# Patient Record
Sex: Male | Born: 1940
Health system: Southern US, Community
[De-identification: ages and names within clinical notes are randomized; demographics above are authoritative.]

## PROBLEM LIST (undated history)

## (undated) DIAGNOSIS — K3532 Acute appendicitis with perforation and localized peritonitis, without abscess: Secondary | ICD-10-CM

## (undated) DIAGNOSIS — Z87442 Personal history of urinary calculi: Secondary | ICD-10-CM

## (undated) DIAGNOSIS — G20 Parkinson's disease: Secondary | ICD-10-CM

## (undated) DIAGNOSIS — K219 Gastro-esophageal reflux disease without esophagitis: Secondary | ICD-10-CM

## (undated) DIAGNOSIS — Z9289 Personal history of other medical treatment: Secondary | ICD-10-CM

## (undated) DIAGNOSIS — M1990 Unspecified osteoarthritis, unspecified site: Secondary | ICD-10-CM

## (undated) HISTORY — DX: Personal history of urinary calculi: Z87.442

## (undated) HISTORY — DX: Acute appendicitis with perforation, localized peritonitis, and gangrene, without abscess: K35.32

## (undated) HISTORY — PX: TONSILLECTOMY: SUR1361

## (undated) HISTORY — DX: Gastro-esophageal reflux disease without esophagitis: K21.9

## (undated) HISTORY — PX: JOINT REPLACEMENT: SHX530

## (undated) HISTORY — PX: APPENDECTOMY: SHX54

## (undated) HISTORY — DX: Parkinson's disease: G20

## (undated) HISTORY — DX: Personal history of other medical treatment: Z92.89

---

## 2001-03-25 ENCOUNTER — Encounter: Payer: Self-pay | Admitting: Family Medicine

## 2001-03-26 ENCOUNTER — Encounter: Payer: Self-pay | Admitting: Family Medicine

## 2002-07-20 DIAGNOSIS — G20A1 Parkinson's disease without dyskinesia, without mention of fluctuations: Secondary | ICD-10-CM

## 2002-07-20 DIAGNOSIS — G20 Parkinson's disease: Secondary | ICD-10-CM

## 2002-07-20 HISTORY — DX: Parkinson's disease: G20

## 2002-07-20 HISTORY — DX: Parkinson's disease without dyskinesia, without mention of fluctuations: G20.A1

## 2002-08-25 ENCOUNTER — Encounter: Payer: Self-pay | Admitting: Family Medicine

## 2002-08-25 LAB — CONVERTED CEMR LAB
Blood Glucose, Fasting: 100 mg/dL
PSA: 0.8 ng/mL
TSH: 1.8 microintl units/mL

## 2003-08-17 ENCOUNTER — Encounter: Payer: Self-pay | Admitting: Family Medicine

## 2003-08-17 LAB — CONVERTED CEMR LAB
PSA: 0.5 ng/mL
TSH: 1.94 microintl units/mL

## 2003-09-22 ENCOUNTER — Encounter: Payer: Self-pay | Admitting: Family Medicine

## 2003-09-22 LAB — CONVERTED CEMR LAB: TSH: 2.79 microintl units/mL

## 2004-02-03 ENCOUNTER — Encounter: Payer: Self-pay | Admitting: Family Medicine

## 2004-02-03 LAB — CONVERTED CEMR LAB: WBC, blood: 7.8 10*3/uL

## 2004-08-29 ENCOUNTER — Ambulatory Visit: Payer: Self-pay | Admitting: Family Medicine

## 2004-08-29 LAB — CONVERTED CEMR LAB
PSA: 0.57 ng/mL
TSH: 2.9 microintl units/mL

## 2004-09-05 ENCOUNTER — Ambulatory Visit: Payer: Self-pay | Admitting: Family Medicine

## 2004-10-07 ENCOUNTER — Ambulatory Visit: Payer: Self-pay | Admitting: Family Medicine

## 2004-12-05 ENCOUNTER — Ambulatory Visit: Payer: Self-pay | Admitting: Family Medicine

## 2004-12-12 DIAGNOSIS — Z9289 Personal history of other medical treatment: Secondary | ICD-10-CM

## 2004-12-12 HISTORY — DX: Personal history of other medical treatment: Z92.89

## 2005-02-02 ENCOUNTER — Ambulatory Visit: Payer: Self-pay | Admitting: Urology

## 2005-02-02 ENCOUNTER — Encounter: Payer: Self-pay | Admitting: Family Medicine

## 2005-02-02 ENCOUNTER — Ambulatory Visit: Payer: Self-pay | Admitting: Internal Medicine

## 2005-02-02 LAB — CONVERTED CEMR LAB: TSH: 2.43 microintl units/mL

## 2005-02-13 ENCOUNTER — Ambulatory Visit: Payer: Self-pay | Admitting: Internal Medicine

## 2005-03-26 ENCOUNTER — Ambulatory Visit: Payer: Self-pay | Admitting: Family Medicine

## 2005-07-23 ENCOUNTER — Ambulatory Visit: Payer: Self-pay | Admitting: Family Medicine

## 2005-09-15 ENCOUNTER — Ambulatory Visit: Payer: Self-pay | Admitting: Family Medicine

## 2005-09-15 LAB — CONVERTED CEMR LAB
Blood Glucose, Fasting: 98 mg/dL
RBC count: 4.69 10*6/uL
WBC, blood: 5.3 10*3/uL

## 2005-09-18 ENCOUNTER — Ambulatory Visit: Payer: Self-pay | Admitting: Family Medicine

## 2005-10-08 ENCOUNTER — Ambulatory Visit: Payer: Self-pay | Admitting: Family Medicine

## 2005-12-16 ENCOUNTER — Ambulatory Visit: Payer: Self-pay | Admitting: Family Medicine

## 2006-09-22 ENCOUNTER — Ambulatory Visit: Payer: Self-pay | Admitting: Family Medicine

## 2006-09-22 LAB — CONVERTED CEMR LAB
ALT: 16 units/L (ref 0–40)
AST: 22 units/L (ref 0–37)
Alkaline Phosphatase: 49 units/L (ref 39–117)
Bilirubin, Direct: 0.2 mg/dL (ref 0.0–0.3)
Blood Glucose, Fasting: 98 mg/dL
Calcium: 8.6 mg/dL (ref 8.4–10.5)
Chloride: 106 meq/L (ref 96–112)
Cholesterol: 170 mg/dL (ref 0–200)
Creatinine, Ser: 0.8 mg/dL (ref 0.4–1.5)
GFR calc non Af Amer: 103 mL/min
Glucose, Bld: 98 mg/dL (ref 70–99)
LDL Cholesterol: 114 mg/dL — ABNORMAL HIGH (ref 0–99)
PSA: 0.74 ng/mL
PSA: 0.74 ng/mL (ref 0.10–4.00)
Sodium: 141 meq/L (ref 135–145)

## 2006-09-24 ENCOUNTER — Ambulatory Visit: Payer: Self-pay | Admitting: Family Medicine

## 2006-11-01 ENCOUNTER — Ambulatory Visit: Payer: Self-pay | Admitting: Family Medicine

## 2007-07-22 ENCOUNTER — Encounter: Payer: Self-pay | Admitting: Family Medicine

## 2007-09-12 ENCOUNTER — Encounter: Payer: Self-pay | Admitting: Family Medicine

## 2007-09-12 ENCOUNTER — Telehealth: Payer: Self-pay | Admitting: Family Medicine

## 2007-09-22 ENCOUNTER — Encounter: Payer: Self-pay | Admitting: Family Medicine

## 2007-12-09 ENCOUNTER — Encounter: Payer: Self-pay | Admitting: Family Medicine

## 2007-12-09 DIAGNOSIS — E7800 Pure hypercholesterolemia, unspecified: Secondary | ICD-10-CM

## 2007-12-09 DIAGNOSIS — K219 Gastro-esophageal reflux disease without esophagitis: Secondary | ICD-10-CM

## 2007-12-09 DIAGNOSIS — Z87442 Personal history of urinary calculi: Secondary | ICD-10-CM

## 2007-12-22 ENCOUNTER — Ambulatory Visit: Payer: Self-pay | Admitting: Family Medicine

## 2007-12-22 LAB — CONVERTED CEMR LAB
AST: 22 units/L (ref 0–37)
Alkaline Phosphatase: 52 units/L (ref 39–117)
Basophils Absolute: 0 10*3/uL (ref 0.0–0.1)
Bilirubin, Direct: 0.3 mg/dL (ref 0.0–0.3)
CO2: 31 meq/L (ref 19–32)
Calcium: 8.8 mg/dL (ref 8.4–10.5)
Chloride: 100 meq/L (ref 96–112)
Cholesterol: 171 mg/dL (ref 0–200)
Eosinophils Relative: 7.5 % — ABNORMAL HIGH (ref 0.0–5.0)
GFR calc Af Amer: 109 mL/min
Glucose, Bld: 96 mg/dL (ref 70–99)
HCT: 42 % (ref 39.0–52.0)
HDL: 49.9 mg/dL (ref >39.0)
LDL Cholesterol: 114 mg/dL — ABNORMAL HIGH (ref 0–99)
Lymphocytes Relative: 26.4 % (ref 12.0–46.0)
MCHC: 35.5 g/dL (ref 30.0–36.0)
Monocytes Absolute: 0.5 10*3/uL (ref 0.1–1.0)
Monocytes Relative: 11.4 % (ref 3.0–12.0)
Platelets: 182 10*3/uL (ref 150–400)
Potassium: 3.8 meq/L (ref 3.5–5.1)
RDW: 12 % (ref 11.5–14.6)
Sodium: 138 meq/L (ref 135–145)
TSH: 4.03 microintl units/mL (ref 0.35–5.50)
Total Bilirubin: 2.6 mg/dL — ABNORMAL HIGH (ref 0.3–1.2)
Total CHOL/HDL Ratio: 3.4
Total Protein: 6.3 g/dL (ref 6.0–8.3)
Triglycerides: 36 mg/dL (ref 0–149)
VLDL: 7 mg/dL (ref 0–40)

## 2007-12-29 ENCOUNTER — Ambulatory Visit: Payer: Self-pay | Admitting: Family Medicine

## 2008-01-12 ENCOUNTER — Encounter: Payer: Self-pay | Admitting: Family Medicine

## 2008-02-10 ENCOUNTER — Ambulatory Visit: Payer: Self-pay | Admitting: Family Medicine

## 2008-02-10 LAB — CONVERTED CEMR LAB
OCCULT 1: NEGATIVE
OCCULT 3: NEGATIVE

## 2008-02-13 ENCOUNTER — Encounter (INDEPENDENT_AMBULATORY_CARE_PROVIDER_SITE_OTHER): Payer: Self-pay | Admitting: *Deleted

## 2008-12-31 ENCOUNTER — Ambulatory Visit: Payer: Self-pay | Admitting: Family Medicine

## 2008-12-31 LAB — CONVERTED CEMR LAB
ALT: 16 units/L (ref 0–53)
AST: 17 units/L (ref 0–37)
Albumin: 3.7 g/dL (ref 3.5–5.2)
Alkaline Phosphatase: 54 units/L (ref 39–117)
BUN: 20 mg/dL (ref 6–23)
Basophils Absolute: 0 10*3/uL (ref 0.0–0.1)
Basophils Relative: 0.1 % (ref 0.0–3.0)
CO2: 32 meq/L (ref 19–32)
Calcium: 8.8 mg/dL (ref 8.4–10.5)
Chloride: 111 meq/L (ref 96–112)
Cholesterol: 189 mg/dL (ref 0–200)
Creatinine, Ser: 0.9 mg/dL (ref 0.4–1.5)
Eosinophils Relative: 13 % — ABNORMAL HIGH (ref 0.0–5.0)
Glucose, Bld: 106 mg/dL — ABNORMAL HIGH (ref 70–99)
LDL Cholesterol: 124 mg/dL — ABNORMAL HIGH (ref 0–99)
Lymphocytes Relative: 25.2 % (ref 12.0–46.0)
MCHC: 35.3 g/dL (ref 30.0–36.0)
MCV: 102.4 fL — ABNORMAL HIGH (ref 78.0–100.0)
Monocytes Absolute: 0.5 10*3/uL (ref 0.1–1.0)
Monocytes Relative: 9.9 % (ref 3.0–12.0)
Neutrophils Relative %: 51.8 % (ref 43.0–77.0)
PSA: 0.79 ng/mL (ref 0.10–4.00)
Platelets: 167 10*3/uL (ref 150.0–400.0)
Potassium: 4.2 meq/L (ref 3.5–5.1)
RBC: 4.04 M/uL — ABNORMAL LOW (ref 4.22–5.81)
TSH: 2.36 microintl units/mL (ref 0.35–5.50)
Total Protein: 6.4 g/dL (ref 6.0–8.3)
Triglycerides: 36 mg/dL (ref 0.0–149.0)
WBC: 4.7 10*3/uL (ref 4.5–10.5)

## 2009-01-01 ENCOUNTER — Ambulatory Visit: Payer: Self-pay | Admitting: Family Medicine

## 2009-01-01 DIAGNOSIS — I350 Nonrheumatic aortic (valve) stenosis: Secondary | ICD-10-CM

## 2009-01-08 ENCOUNTER — Encounter: Payer: Self-pay | Admitting: Family Medicine

## 2009-01-10 ENCOUNTER — Encounter: Payer: Self-pay | Admitting: Family Medicine

## 2009-01-10 ENCOUNTER — Ambulatory Visit: Payer: Self-pay

## 2009-01-10 HISTORY — PX: DOPPLER ECHOCARDIOGRAPHY: SHX263

## 2009-06-20 ENCOUNTER — Ambulatory Visit: Payer: Self-pay | Admitting: Family Medicine

## 2009-06-21 ENCOUNTER — Telehealth: Payer: Self-pay | Admitting: Family Medicine

## 2009-08-12 ENCOUNTER — Telehealth: Payer: Self-pay | Admitting: Family Medicine

## 2009-09-11 ENCOUNTER — Encounter: Payer: Self-pay | Admitting: Family Medicine

## 2009-10-18 ENCOUNTER — Encounter: Payer: Self-pay | Admitting: Family Medicine

## 2009-11-21 ENCOUNTER — Telehealth: Payer: Self-pay | Admitting: Family Medicine

## 2010-01-01 ENCOUNTER — Ambulatory Visit: Payer: Self-pay | Admitting: Family Medicine

## 2010-01-02 ENCOUNTER — Telehealth: Payer: Self-pay | Admitting: Family Medicine

## 2010-01-02 ENCOUNTER — Ambulatory Visit: Payer: Self-pay | Admitting: Family Medicine

## 2010-01-02 LAB — CONVERTED CEMR LAB
ALT: 7 units/L (ref 0–53)
Albumin: 4 g/dL (ref 3.5–5.2)
Alkaline Phosphatase: 62 units/L (ref 39–117)
BUN: 15 mg/dL (ref 6–23)
Basophils Absolute: 0 10*3/uL (ref 0.0–0.1)
Bilirubin, Direct: 0.2 mg/dL (ref 0.0–0.3)
CO2: 33 meq/L — ABNORMAL HIGH (ref 19–32)
Calcium: 9.1 mg/dL (ref 8.4–10.5)
Chloride: 104 meq/L (ref 96–112)
Creatinine, Ser: 0.7 mg/dL (ref 0.4–1.5)
Eosinophils Absolute: 0.3 10*3/uL (ref 0.0–0.7)
Eosinophils Relative: 7.4 % — ABNORMAL HIGH (ref 0.0–5.0)
HDL: 57.9 mg/dL (ref >39.00)
LDL Cholesterol: 126 mg/dL — ABNORMAL HIGH (ref 0–99)
Lymphocytes Relative: 31.8 % (ref 12.0–46.0)
Lymphs Abs: 1.4 10*3/uL (ref 0.7–4.0)
Monocytes Relative: 12 % (ref 3.0–12.0)
PSA: 0.71 ng/mL (ref 0.10–4.00)
Platelets: 177 10*3/uL (ref 150.0–400.0)
Potassium: 4.6 meq/L (ref 3.5–5.1)
RDW: 13 % (ref 11.5–14.6)
Sodium: 141 meq/L (ref 135–145)
TSH: 2.37 microintl units/mL (ref 0.35–5.50)
Total Bilirubin: 1.5 mg/dL — ABNORMAL HIGH (ref 0.3–1.2)
Total CHOL/HDL Ratio: 3
Triglycerides: 47 mg/dL (ref 0.0–149.0)
WBC: 4.3 10*3/uL — ABNORMAL LOW (ref 4.5–10.5)

## 2010-01-06 ENCOUNTER — Encounter: Payer: Self-pay | Admitting: Family Medicine

## 2010-01-06 LAB — CONVERTED CEMR LAB: Vit D, 25-Hydroxy: 56 ng/mL (ref 30–89)

## 2010-02-12 ENCOUNTER — Ambulatory Visit: Payer: Self-pay | Admitting: Family Medicine

## 2010-02-12 ENCOUNTER — Encounter (INDEPENDENT_AMBULATORY_CARE_PROVIDER_SITE_OTHER): Payer: Self-pay | Admitting: *Deleted

## 2010-02-12 LAB — CONVERTED CEMR LAB: OCCULT 1: NEGATIVE

## 2010-02-20 ENCOUNTER — Encounter (INDEPENDENT_AMBULATORY_CARE_PROVIDER_SITE_OTHER): Payer: Self-pay | Admitting: *Deleted

## 2010-04-30 ENCOUNTER — Encounter: Payer: Self-pay | Admitting: Family Medicine

## 2010-05-12 ENCOUNTER — Encounter: Payer: Self-pay | Admitting: Family Medicine

## 2010-08-01 ENCOUNTER — Telehealth: Payer: Self-pay | Admitting: Family Medicine

## 2010-08-19 NOTE — Consult Note (Signed)
Summary: Dr.James Love,[facility]Note  Dr.James Love,[facility]Note   Imported By: Beau Fanny 09/12/2009 10:22:08  _____________________________________________________________________  External Attachment:    Type:   Image     Comment:   External Document

## 2010-08-19 NOTE — Letter (Signed)
Summary: Dr.Howard Miller,[facility],Note  Dr.Howard Miller,[facility],Note   Imported By: Beau Fanny 05/14/2010 10:29:56  _____________________________________________________________________  External Attachment:    Type:   Image     Comment:   External Document

## 2010-08-19 NOTE — Progress Notes (Signed)
Summary: wants written script for klonopin  Phone Note Refill Request   Refills Requested: Medication #1:  KLONOPIN 0.5 MG  TABS 2 TABLET BY MOUTH AT NIGHT AS NEEDED Pt was seen this morning, he is asking for a written script to be mailed to his home address.  Initial call taken by: Lowella Petties CMA,  January 02, 2010 11:04 AM    Prescriptions: KLONOPIN 0.5 MG  TABS (CLONAZEPAM) 2 TABLET BY MOUTH AT NIGHT AS NEEDED  #60 x 5   Entered and Authorized by:   Shaune Leeks MD   Signed by:   Shaune Leeks MD on 01/02/2010   Method used:   Print then Give to Patient   RxID:   [id number]

## 2010-08-19 NOTE — Consult Note (Signed)
Summary: [facility]  [facility]   Imported By: Lanelle Bal 05/08/2010 11:32:36  _____________________________________________________________________  External Attachment:    Type:   Image     Comment:   External Document
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
## 2010-08-19 NOTE — Progress Notes (Signed)
Summary: Clonazepan 0.5mg   Phone Note Refill Request Message from:  Fax from Pharmacy on August 12, 2009 10:55 AM  Refills Requested: Medication #1:  KLONOPIN 0.5 MG  TABS 1 TABLET BY MOUTH AT NIGHT AS NEEDED   Supply Requested: 1 month   Last Refilled: 06/24/2009 [facility] [phone]   Method Requested: Telephone to Pharmacy Initial call taken by: Benny Lennert CMA Duncan Dull),  August 12, 2009 10:55 AM  Follow-up for Phone Call        Rx called to pharmacy Follow-up by: Benny Lennert CMA (AAMA),  August 12, 2009 3:00 PM    Prescriptions: KLONOPIN 0.5 MG  TABS (CLONAZEPAM) 1 TABLET BY MOUTH AT NIGHT AS NEEDED  #30 x 12   Entered and Authorized by:   Shaune Leeks MD   Signed by:   Shaune Leeks MD on 08/12/2009   Method used:   Telephoned to ...       [facility]* (retail)       [address]       [place], [place]  [address]       Ph: [phone]       Fax: [phone]   RxID:   [id number]

## 2010-08-19 NOTE — Progress Notes (Signed)
Summary: needs something for sleep  Phone Note Call from Patient Call back at Home Phone [phone] Call back at [phone]   Caller: Patient Call For: Dr.Carmellia Kreisler Summary of Call: Pt is requesting a sleeping pill.  Restoril Generic 15 mg # 60 -take 1-2 as needed for sleep. He'd like the rx written for a years supplly. Pt takes Clonopin,but he spoke to his sister, a pharmacist, and she recommended the Restoril.  The Clonopin only works for about 4 hours.Please call in the rx to [facility]. He asked for it to be called in today,so he can get some sleep tonight. Initial call taken by: Beau Fanny,  Nov 21, 2009 9:59 AM  Follow-up for Phone Call        Please insure he reads the fine print. Follow-up by: Shaune Leeks MD,  Nov 21, 2009 1:22 PM  Additional Follow-up for Phone Call Additional follow up Details #1::        Medicine called to pharmacy, advised pt.  He will use with caution. Additional Follow-up by: Lowella Petties CMA,  Nov 21, 2009 3:33 PM    New/Updated Medications: TEMAZEPAM 15 MG CAPS (TEMAZEPAM) take 1-2 tabs by mouth at night as needed for sleep. Prescriptions: TEMAZEPAM 15 MG CAPS (TEMAZEPAM) take 1-2 tabs by mouth at night as needed for sleep.  #60 x 5   Entered and Authorized by:   Shaune Leeks MD   Signed by:   Shaune Leeks MD on 11/21/2009   Method used:   Telephoned to ...         RxID:   [id number]

## 2010-08-19 NOTE — Letter (Signed)
Summary: Nadara Eaton letter  [place] at [facility]  [address] [place], [place] [address]   Phone: [phone]  Fax: [phone]       02/20/2010 MRN: [id number]  [facility] [address] [place], [place]  [address]  Dear Mr. Aund[facility], and [facility] announce the retirement of Arta Silence, M.D., from full-time practice at the [facility] office effective January 16, 2010 and his plans of returning part-time.  It is important to Dr. Hetty Ely and to our practice that you understand that [facility] has seven physicians in our office for your health care needs.  We will continue to offer the same exceptional care that you have today.    Dr. Hetty Ely has spoken to many of you about his plans for retirement and returning part-time in the fall.   We will continue to work with you through the transition to schedule appointments for you in the office and meet the high standards that [place] is committed to.   Again, it is with great pleasure that we share the news that Dr. Hetty Ely will return to [facility] at [facility] in October of 2011 with a reduced schedule.    If you have any questions, or would like to request an appointment with one of our physicians, please call us at [phone] and press the option for Scheduling an appointment.  We take pleasure in providing you with excellent patient care and look forward to seeing you at your next office visit.  Our [facility] Physicians are:  Tillman Abide, M.D. Laurita Quint, M.D. Roxy Manns, M.D. Kerby Nora, M.D. Hannah Beat, M.D. Ruthe Mannan, M.D. We proudly welcomed Raechel Ache, M.D. and Eustaquio Boyden, M.D. to the practice in July/August 2011.  Sincerely,  [facility]

## 2010-08-19 NOTE — Letter (Signed)
Summary: Results Follow up Letter  [place] at [facility]  [address] [place], [place] [address]   Phone: [phone]  Fax: [phone]    02/12/2010 MRN: [id number]    Casey Tucker [address] [place], [place]  [address]    Dear Mr. SHAMPINE,  The following are the results of your recent test(s):  Test         Result    Pap Smear:        Normal _____  Not Normal _____ Comments: ______________________________________________________ Cholesterol: LDL(Bad cholesterol):         Your goal is less than:         HDL (Good cholesterol):       Your goal is more than: Comments:  ______________________________________________________ Mammogram:        Normal _____  Not Normal _____ Comments:  ___________________________________________________________________ Hemoccult:        Normal __X___  Not normal _______ Comments:    _____________________________________________________________________ Other Tests:    We routinely do not discuss normal results over the telephone.  If you desire a copy of the results, or you have any questions about this information we can discuss them at your next office visit.   Sincerely,   Dwana Curd. Para March, M.D.  [facility]

## 2010-08-19 NOTE — Consult Note (Signed)
Summary: Dr.James Love,[facility]Note  Dr.James Love,[facility]Note   Imported By: Beau Fanny 01/07/2010 10:04:54  _____________________________________________________________________  External Attachment:    Type:   Image     Comment:   External Document

## 2010-08-19 NOTE — Letter (Signed)
Summary: Dr.Joseph Museum/gallery curator  Dr.Joseph Savitt,[facility],Note   Imported By: Beau Fanny 11/18/2009 15:24:07  _____________________________________________________________________  External Attachment:    Type:   Image     Comment:   External Document

## 2010-08-21 NOTE — Progress Notes (Signed)
Summary: refill request for klonopin  Phone Note Refill Request Message from:  Fax from Pharmacy  Refills Requested: Medication #1:  KLONOPIN 0.5 MG  TABS 2 TABLET BY MOUTH AT NIGHT AS NEEDED   Last Refilled: 07/03/2010 Faxed request from [facility], phone [phone]  Initial call taken by: Lowella Petties CMA, AAMA,  August 01, 2010 12:38 PM  Follow-up for Phone Call        please call in.  Follow-up by: Crawford Givens MD,  August 01, 2010 1:40 PM  Additional Follow-up for Phone Call Additional follow up Details #1::        Medication phoned to pharmacy.  Additional Follow-up by: Delilah Shan CMA (AAMA),  August 01, 2010 2:31 PM    Prescriptions: KLONOPIN 0.5 MG  TABS (CLONAZEPAM) 2 TABLET BY MOUTH AT NIGHT AS NEEDED  #60 x 5   Entered and Authorized by:   Crawford Givens MD   Signed by:   Crawford Givens MD on 08/01/2010   Method used:   Telephoned to ...         RxID:   [id number]

## 2010-09-02 ENCOUNTER — Emergency Department: Payer: Self-pay | Admitting: Emergency Medicine

## 2010-09-10 ENCOUNTER — Encounter: Payer: Self-pay | Admitting: Family Medicine

## 2010-09-25 NOTE — Letter (Signed)
Summary: [facility]  [facility]   Imported By: Kassie Mends 09/16/2010 09:17:58  _____________________________________________________________________  External Attachment:    Type:   Image     Comment:   External Document

## 2010-12-27 ENCOUNTER — Encounter: Payer: Self-pay | Admitting: Family Medicine

## 2011-01-05 ENCOUNTER — Encounter: Payer: Self-pay | Admitting: Cardiovascular Disease

## 2011-01-05 ENCOUNTER — Other Ambulatory Visit: Payer: Self-pay

## 2011-01-08 ENCOUNTER — Encounter: Payer: Self-pay | Admitting: Family Medicine

## 2011-01-22 ENCOUNTER — Other Ambulatory Visit: Payer: Self-pay | Admitting: Family Medicine

## 2011-01-22 DIAGNOSIS — E7800 Pure hypercholesterolemia, unspecified: Secondary | ICD-10-CM

## 2011-01-22 DIAGNOSIS — Z125 Encounter for screening for malignant neoplasm of prostate: Secondary | ICD-10-CM

## 2011-01-28 ENCOUNTER — Other Ambulatory Visit (INDEPENDENT_AMBULATORY_CARE_PROVIDER_SITE_OTHER): Payer: Medicare Other | Admitting: Family Medicine

## 2011-01-28 DIAGNOSIS — G20 Parkinson's disease: Secondary | ICD-10-CM

## 2011-01-28 DIAGNOSIS — E7800 Pure hypercholesterolemia, unspecified: Secondary | ICD-10-CM

## 2011-01-28 DIAGNOSIS — Z125 Encounter for screening for malignant neoplasm of prostate: Secondary | ICD-10-CM

## 2011-01-28 LAB — HEPATIC FUNCTION PANEL
ALT: 10 U/L (ref 0–53)
AST: 18 U/L (ref 0–37)
Albumin: 3.9 g/dL (ref 3.5–5.2)
Alkaline Phosphatase: 65 U/L (ref 39–117)
Bilirubin, Direct: 0.2 mg/dL (ref 0.0–0.3)
Total Protein: 6.2 g/dL (ref 6.0–8.3)

## 2011-01-28 LAB — CBC WITH DIFFERENTIAL/PLATELET
Basophils Absolute: 0 10*3/uL (ref 0.0–0.1)
Eosinophils Absolute: 0.3 10*3/uL (ref 0.0–0.7)
HCT: 41 % (ref 39.0–52.0)
Hemoglobin: 14.4 g/dL (ref 13.0–17.0)
Lymphocytes Relative: 29.7 % (ref 12.0–46.0)
Lymphs Abs: 1.3 10*3/uL (ref 0.7–4.0)
Monocytes Absolute: 0.5 10*3/uL (ref 0.1–1.0)
Monocytes Relative: 10.7 % (ref 3.0–12.0)
Neutrophils Relative %: 52.1 % (ref 43.0–77.0)
Platelets: 175 10*3/uL (ref 150.0–400.0)
RDW: 13.5 % (ref 11.5–14.6)

## 2011-01-28 LAB — BASIC METABOLIC PANEL
BUN: 18 mg/dL (ref 6–23)
Calcium: 8.5 mg/dL (ref 8.4–10.5)
Creatinine, Ser: 0.7 mg/dL (ref 0.4–1.5)
GFR: 111.36 mL/min (ref >60.00)
Glucose, Bld: 95 mg/dL (ref 70–99)
Potassium: 4 mEq/L (ref 3.5–5.1)
Sodium: 140 mEq/L (ref 135–145)

## 2011-01-28 LAB — LIPID PANEL
Cholesterol: 189 mg/dL (ref 0–200)
LDL Cholesterol: 120 mg/dL — ABNORMAL HIGH (ref 0–99)
Triglycerides: 32 mg/dL (ref 0.0–149.0)

## 2011-01-28 LAB — PSA: PSA: 0.73 ng/mL (ref 0.10–4.00)

## 2011-01-29 ENCOUNTER — Encounter: Payer: Self-pay | Admitting: Family Medicine

## 2011-01-29 ENCOUNTER — Ambulatory Visit (INDEPENDENT_AMBULATORY_CARE_PROVIDER_SITE_OTHER): Payer: Medicare Other | Admitting: Family Medicine

## 2011-01-29 DIAGNOSIS — Z0000 Encounter for general adult medical examination without abnormal findings: Secondary | ICD-10-CM

## 2011-01-29 DIAGNOSIS — Z87442 Personal history of urinary calculi: Secondary | ICD-10-CM

## 2011-01-29 DIAGNOSIS — E7800 Pure hypercholesterolemia, unspecified: Secondary | ICD-10-CM

## 2011-01-29 DIAGNOSIS — K219 Gastro-esophageal reflux disease without esophagitis: Secondary | ICD-10-CM

## 2011-01-29 DIAGNOSIS — G20 Parkinson's disease: Secondary | ICD-10-CM | POA: Insufficient documentation

## 2011-01-29 LAB — VITAMIN B12: Vitamin B-12: 681 pg/mL (ref 211–911)

## 2011-01-29 NOTE — Assessment & Plan Note (Signed)
Stable

## 2011-01-29 NOTE — Assessment & Plan Note (Signed)
Stable and unchanged

## 2011-01-29 NOTE — Assessment & Plan Note (Signed)
Adequate control but try to get LDL lower. Discussed diet. Lab Results  Component Value Date   CHOL 189 01/28/2011   CHOL 193 01/01/2010   CHOL 189 12/31/2008   Lab Results  Component Value Date   HDL 62.40 01/28/2011   HDL 16.10 01/01/2010   HDL 96.04 12/31/2008   Lab Results  Component Value Date   LDLCALC 120* 01/28/2011   LDLCALC 126* 01/01/2010   LDLCALC 124* 12/31/2008   Lab Results  Component Value Date   TRIG 32.0 01/28/2011   TRIG 47.0 01/01/2010   TRIG 36.0 12/31/2008   Lab Results  Component Value Date   CHOLHDL 3 01/28/2011   CHOLHDL 3 01/01/2010   CHOLHDL 3 12/31/2008   No results found for this basename: LDLDIRECT

## 2011-01-29 NOTE — Assessment & Plan Note (Signed)
Encouraged continued hydration.

## 2011-01-29 NOTE — Patient Instructions (Signed)
Discuss colonoscopy with Shirlee Limerick.

## 2011-01-29 NOTE — Progress Notes (Signed)
Subjective:    Patient ID: Casey Tucker, male    DOB: 1941-04-09, 70 y.o.   MRN: [id number]  HPI Pt here for Comp Exam. He sees Dr Sandria Manly regularly for followup of Parkinson's. He has been doing great. He was last seen a few weeks ago. Now called Atypical Parkinson's.  He has had an intense vacation lately. He has had hip pain and finally had an injection. This has really helped. Sees Dr Hyacinth Meeker and the injection really helped.  He has been in the hospital overnight for passing out at a restaurant. He gets lightheaded easily since childhood. Review of Systems  Constitutional: Negative for fever, chills, diaphoresis, appetite change, fatigue and unexpected weight change.  HENT: Negative for hearing loss, ear pain, tinnitus and ear discharge.   Eyes: Negative for pain, discharge and visual disturbance.  Respiratory: Negative for cough, shortness of breath and wheezing.   Cardiovascular: Negative for chest pain and palpitations.       No SOB w/ exertion  Gastrointestinal: Positive for constipation (rare constipation.). Negative for nausea, vomiting, abdominal pain, diarrhea and blood in stool.       No heartburn or swallowing problems.  Genitourinary: Negative for dysuria, frequency and difficulty urinating.       No nocturia  Musculoskeletal: Negative for myalgias, back pain and arthralgias.  Skin: Negative for rash.       No itching or dryness.  Neurological: Negative for tremors and numbness.       No tingling or balance problems.  Hematological: Negative for adenopathy. Does not bruise/bleed easily.  Psychiatric/Behavioral: Negative for dysphoric mood and agitation.    Allergies  Allergen Reactions  . Penicillins     REACTION: RASH   Current Outpatient Prescriptions on File Prior to Visit  Medication Sig Dispense Refill  . carbidopa-levodopa (SINEMET) 25-100 MG per tablet 1 tablet 3 (three) times daily.       . clonazePAM (KLONOPIN) 0.5 MG tablet Take 2 by mouth at night as  needed       . meloxicam (MOBIC) 15 MG tablet Take 15 mg by mouth daily as needed. With food       . pramipexole (MIRAPEX) 0.5 MG tablet Take 0.5 mg by mouth 4 (four) times daily. Per Dr. Sandria Manly      . Rasagiline Mesylate (AZILECT) 1 MG TABS Take by mouth daily.        . temazepam (RESTORIL) 15 MG capsule Take 1-2 by mouth at night as needed for sleep        Past Medical History  Diagnosis Date  . GERD (gastroesophageal reflux disease) Pre 2002    Mild  . Ruptured appendix teens  . History of kidney stones   . 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 Benign essential tremor     father had tremor   History   Social History  . Marital Status: Married    Spouse Name: N/A    Number of Children: 2  . Years of Education: N/A   Occupational History  . Optometrist     medically retired   Social History Main Topics  . Smoking status: Never Smoker   . Smokeless tobacco: Never Used  . Alcohol Use: 3.5 oz/week    7 drink(s) per week     occassionally  . Drug Use: No  . Sexually Active: Yes   Other Topics Concern  . Not on file  Social History Narrative   Married, lives with wife2 daughters   Family History  Problem Relation Age of Onset  . Arthritis Mother     knee replacement  . COPD Father     emphysema, smoker  . Heart disease Father     CHF  . Alcohol abuse Paternal Uncle       BP 118/64  Pulse 76  Temp(Src) 97.5 F (36.4 C) (Oral)  Ht 5\' 11"  (1.803 m)  Wt 188 lb 8 oz (85.503 kg)  BMI 26.29 kg/m2  Objective:   Physical Exam  Constitutional: He is oriented to person, place, and time. He appears well-developed and well-nourished. No distress.  HENT:  Head: Normocephalic and atraumatic.  Right Ear: External ear normal.  Left Ear: External ear normal.  Nose: Nose normal.  Mouth/Throat: Oropharynx is clear and moist.  Eyes: Conjunctivae and EOM are normal. Pupils are equal, round,  and reactive to light. Right eye exhibits no discharge. Left eye exhibits no discharge. No scleral icterus.  Neck: Normal range of motion. Neck supple. No thyromegaly present.  Cardiovascular: Normal rate, regular rhythm, normal heart sounds and intact distal pulses.   No murmur heard. Pulmonary/Chest: Effort normal and breath sounds normal. No respiratory distress. He has no wheezes.  Abdominal: Soft. Bowel sounds are normal. He exhibits no distension and no mass. There is no tenderness. There is no rebound and no guarding.  Genitourinary: Rectum normal, prostate normal and penis normal. Guaiac negative stool.       Minimal bulge at the top of the right inguinal canal. Does not yet appear to be overt hernia, merely weakening. Prostate 10gms, smooth, symmetric and firm.  Musculoskeletal: Normal range of motion. He exhibits no edema.  Lymphadenopathy:    He has no cervical adenopathy.  Neurological: He is alert and oriented to person, place, and time. Coordination normal.  Skin: Skin is warm and dry. No rash noted. He is not diaphoretic.  Psychiatric: He has a normal mood and affect. His behavior is normal. Judgment and thought content normal.          Assessment & Plan:  HMPE  I have personally reviewed the Medicare Annual Wellness questionnaire and have noted 1. The patient's medical and social history 2. Their use of alcohol, tobacco or illicit drugs 3. Their current medications and supplements 4. The patient's functional ability including ADL's, fall risks, home safety risks and hearing or visual             impairment. 5. Diet and physical activities 6. Evidence for depression or mood disorders

## 2011-01-29 NOTE — Assessment & Plan Note (Signed)
Stable, great response to medications. Per Dr Sandria Manly.

## 2011-02-03 ENCOUNTER — Other Ambulatory Visit: Payer: Medicare Other

## 2011-02-03 ENCOUNTER — Other Ambulatory Visit: Payer: Self-pay | Admitting: Family Medicine

## 2011-02-03 DIAGNOSIS — Z1211 Encounter for screening for malignant neoplasm of colon: Secondary | ICD-10-CM

## 2011-02-03 LAB — FECAL OCCULT BLOOD, IMMUNOCHEMICAL: Fecal Occult Bld: NEGATIVE

## 2011-02-05 ENCOUNTER — Encounter: Payer: Self-pay | Admitting: *Deleted

## 2011-03-31 ENCOUNTER — Other Ambulatory Visit: Payer: Self-pay | Admitting: *Deleted

## 2011-03-31 NOTE — Telephone Encounter (Signed)
On temazepam and clonazepam. Will wait for Dr. Kathie Rhodes to return tomorrow.

## 2011-03-31 NOTE — Telephone Encounter (Signed)
Ok to refill in Dr. Lorenza Chick absence?

## 2011-04-01 MED ORDER — CLONAZEPAM 0.5 MG PO TABS: ORAL_TABLET | ORAL | Status: DC

## 2011-04-01 NOTE — Telephone Encounter (Signed)
He has not taken Restoril for quite a while. He takes Clonazepam, typically one at night as needed.

## 2011-04-01 NOTE — Telephone Encounter (Signed)
Pharmacist notified as instructed by telephone. Was informed by pharmacist that they had requested Clonazepam and not Restoril.  Refill given to pharmacist as instructed.

## 2011-07-23 ENCOUNTER — Telehealth: Payer: Self-pay | Admitting: Internal Medicine

## 2011-07-23 DIAGNOSIS — M1610 Unilateral primary osteoarthritis, unspecified hip: Secondary | ICD-10-CM | POA: Diagnosis not present

## 2011-07-23 NOTE — Telephone Encounter (Signed)
Advised patient that copy of his most recent physical will be put in the mail for him today.

## 2011-07-23 NOTE — Telephone Encounter (Signed)
Patient would like a copy of his physical that was done in June or July of 2012 mailed to him.

## 2011-07-30 DIAGNOSIS — M169 Osteoarthritis of hip, unspecified: Secondary | ICD-10-CM | POA: Diagnosis not present

## 2011-07-31 ENCOUNTER — Telehealth: Payer: Self-pay | Admitting: *Deleted

## 2011-07-31 NOTE — Telephone Encounter (Signed)
Patient scheduled appt. On 08/14/11 @ 8:30.

## 2011-07-31 NOTE — Telephone Encounter (Signed)
A form has arrived for surgical clearance from this patient's ortho for left hip surgery.  This is Dr. Lorenza Chick patient.  I phoned the patient to find out to whom he would like to transfer his care.  He stated that he would like to see Dr. Alphonsus Sias.  Are you willing to accept him as a patient or do we need to ask him to choose another physician?  He had a CPE back in July or August.

## 2011-07-31 NOTE — Telephone Encounter (Signed)
Okay to set up with me I will need 30 minutes to do preop clearance

## 2011-08-05 ENCOUNTER — Other Ambulatory Visit: Payer: Self-pay | Admitting: Orthopedic Surgery

## 2011-08-10 ENCOUNTER — Telehealth: Payer: Self-pay | Admitting: *Deleted

## 2011-08-10 NOTE — Telephone Encounter (Signed)
Can add today at 4:15 and block 4:30 Then can use 4:45 as same day  Otherwise Wednesday at 12:00

## 2011-08-10 NOTE — Telephone Encounter (Signed)
Patient coming Wednesday at 12pm

## 2011-08-10 NOTE — Telephone Encounter (Signed)
Pt calling asking for a sooner appt for surgical clearance, pt has appt on Friday 08/14/11 @ 8:30am but per pt he needs to have surgery ASAP. I don't see a appt available until maybe Thursday @ 1:30 or Tuesday at 12pm? Please advise if you can see pt sooner.  Pt transferring from Dr.Schaller.

## 2011-08-12 ENCOUNTER — Encounter: Payer: Self-pay | Admitting: Internal Medicine

## 2011-08-12 ENCOUNTER — Ambulatory Visit (INDEPENDENT_AMBULATORY_CARE_PROVIDER_SITE_OTHER): Payer: Medicare Other | Admitting: Internal Medicine

## 2011-08-12 VITALS — BP 130/60 | HR 69 | Temp 98.7°F | Ht 71.0 in | Wt 196.0 lb

## 2011-08-12 DIAGNOSIS — M169 Osteoarthritis of hip, unspecified: Secondary | ICD-10-CM | POA: Insufficient documentation

## 2011-08-12 DIAGNOSIS — R011 Cardiac murmur, unspecified: Secondary | ICD-10-CM

## 2011-08-12 DIAGNOSIS — G20 Parkinson's disease: Secondary | ICD-10-CM

## 2011-08-12 NOTE — Assessment & Plan Note (Signed)
Now with severe pain and disability Due for surgery on Friday No medical contraindications to proposed THR Should have routine DVT prophylaxis

## 2011-08-12 NOTE — Progress Notes (Signed)
Subjective:    Patient ID: Casey Tucker, male    DOB: 08-Jul-1941, 71 y.o.   MRN: [id number]  HPI Here for preop evaluation  Has had progressive worsening of left hip pain Cortisone injection by Dr Hyacinth Meeker in November Sudden worsening and now unable to even walk Now due for [facility] with Dr Katrinka Blazing  Ongoing Parkinson's disease--atypical Generally controlled with med  No chest pain No SOB No paroxysmal nocturnal dyspnea No sig edema Did have syncopal spell in restaurant almost a year ago. It was hot. Evaluation was negative in ER No dizziness  Current Outpatient Prescriptions on File Prior to Visit  Medication Sig Dispense Refill  . carbidopa-levodopa (SINEMET) 25-100 MG per tablet 1 tablet 3 (three) times daily.       . pramipexole (MIRAPEX) 0.5 MG tablet Take 0.5 mg by mouth 4 (four) times daily. Per Dr. Sandria Manly      . Rasagiline Mesylate (AZILECT) 1 MG TABS Take by mouth daily.          Allergies  Allergen Reactions  . Penicillins     REACTION: RASH    Past Medical History  Diagnosis Date  . GERD (gastroesophageal reflux disease) Pre 2002    Mild  . Ruptured appendix teens  . History of kidney stones   . 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 Parkinson disease 2004    Slowly progressive    Past Surgical History  Procedure Date  . Neuro evaluation 12/19/2004    Dr. Sandria Manly, benign tremor  . Doppler echocardiography 01/10/2009    LV NML Mild LVH EF 60-65% aortic sclerosis w/0 stenosis     Family History  Problem Relation Age of Onset  . Arthritis Mother     knee replacement  . COPD Father     emphysema, smoker  . Heart disease Father     CHF  . Alcohol abuse Paternal Uncle     History   Social History  . Marital Status: Married    Spouse Name: N/A    Number of Children: 2  . Years of Education: N/A   Occupational History  . Optometrist     medically retired   Social  History Main Topics  . Smoking status: Never Smoker   . Smokeless tobacco: Never Used  . Alcohol Use: 3.5 oz/week    7 drink(s) per week     occassionally  . Drug Use: No  . Sexually Active: Yes   Other Topics Concern  . Not on file   Social History Narrative   Married, lives with wife2 daughtersHas living willWife has health care POA---then daughtersWould still accept CPR--but no prolonged artificial means (ventilator or tube feeds)   Review of Systems Sleep problems with hip pain Appetite is fine Weight is up a few pounds with inactivity    Objective:   Physical Exam  Constitutional: He appears well-developed and well-nourished. No distress.  Neck: Normal range of motion. Neck supple. No thyromegaly present.  Cardiovascular: Normal rate, regular rhythm and intact distal pulses.  Exam reveals no gallop.   Murmur heard.      Very soft aortic systolic murmur  Pulmonary/Chest: Effort normal and breath sounds normal. No respiratory distress. He has no wheezes. He has no rales.  Abdominal: Soft. There is no tenderness.  Musculoskeletal: He exhibits no edema.  Lymphadenopathy:    He has no cervical adenopathy.  Neurological:  Mild resting tremor in right hand mostly Fairly normal tone  Psychiatric: He has a normal mood and affect. His behavior is normal. Judgment and thought content normal.          Assessment & Plan:

## 2011-08-12 NOTE — Assessment & Plan Note (Addendum)
Echo doesn't show stenosis This should not be an issue for surgery  EKG is normal except for 1 PAC

## 2011-08-12 NOTE — Assessment & Plan Note (Signed)
Fairly mild Controlled with meds and exercise If the THR can be done with spinal, I would recommend this due to increased risk of cognitive decline after general anaesthesia

## 2011-08-13 ENCOUNTER — Ambulatory Visit: Payer: Self-pay | Admitting: Specialist

## 2011-08-13 DIAGNOSIS — M1610 Unilateral primary osteoarthritis, unspecified hip: Secondary | ICD-10-CM | POA: Diagnosis not present

## 2011-08-13 LAB — CBC
HCT: 37.8 % — ABNORMAL LOW (ref 40.0–52.0)
HGB: 13 g/dL (ref 13.0–18.0)
MCH: 35.9 pg — ABNORMAL HIGH (ref 26.0–34.0)
MCHC: 34.4 g/dL (ref 32.0–36.0)
RBC: 3.61 10*6/uL — ABNORMAL LOW (ref 4.40–5.90)
WBC: 6.5 10*3/uL (ref 3.8–10.6)

## 2011-08-13 LAB — URINALYSIS, COMPLETE
Bilirubin,UR: NEGATIVE
Blood: NEGATIVE
Glucose,UR: NEGATIVE mg/dL (ref 0–75)
Leukocyte Esterase: NEGATIVE
Ph: 5 (ref 4.5–8.0)
Specific Gravity: 1.027 (ref 1.003–1.030)
Squamous Epithelial: NONE SEEN

## 2011-08-13 LAB — BASIC METABOLIC PANEL
Anion Gap: 7 (ref 7–16)
BUN: 18 mg/dL (ref 7–18)
Calcium, Total: 8.9 mg/dL (ref 8.5–10.1)
Co2: 30 mmol/L (ref 21–32)
Creatinine: 0.88 mg/dL (ref 0.60–1.30)
EGFR (African American): >60
Osmolality: 274 (ref 275–301)

## 2011-08-13 LAB — APTT: Activated PTT: 30.6 secs (ref 23.6–35.9)

## 2011-08-13 LAB — PROTIME-INR: Prothrombin Time: 13.1 secs (ref 11.5–14.7)

## 2011-08-14 ENCOUNTER — Ambulatory Visit: Payer: Medicare Other | Admitting: Internal Medicine

## 2011-08-14 ENCOUNTER — Inpatient Hospital Stay: Payer: Self-pay | Admitting: Specialist

## 2011-08-14 DIAGNOSIS — Z471 Aftercare following joint replacement surgery: Secondary | ICD-10-CM | POA: Diagnosis not present

## 2011-08-14 DIAGNOSIS — M1990 Unspecified osteoarthritis, unspecified site: Secondary | ICD-10-CM | POA: Diagnosis not present

## 2011-08-14 DIAGNOSIS — Z9889 Other specified postprocedural states: Secondary | ICD-10-CM | POA: Diagnosis not present

## 2011-08-14 DIAGNOSIS — M8708 Idiopathic aseptic necrosis of bone, other site: Secondary | ICD-10-CM | POA: Diagnosis present

## 2011-08-14 DIAGNOSIS — M8700 Idiopathic aseptic necrosis of unspecified bone: Secondary | ICD-10-CM | POA: Diagnosis not present

## 2011-08-14 DIAGNOSIS — A4902 Methicillin resistant Staphylococcus aureus infection, unspecified site: Secondary | ICD-10-CM | POA: Diagnosis not present

## 2011-08-14 DIAGNOSIS — Z880 Allergy status to penicillin: Secondary | ICD-10-CM | POA: Diagnosis not present

## 2011-08-14 DIAGNOSIS — G20 Parkinson's disease: Secondary | ICD-10-CM | POA: Diagnosis present

## 2011-08-14 DIAGNOSIS — M169 Osteoarthritis of hip, unspecified: Secondary | ICD-10-CM | POA: Diagnosis present

## 2011-08-14 DIAGNOSIS — M1610 Unilateral primary osteoarthritis, unspecified hip: Secondary | ICD-10-CM | POA: Diagnosis not present

## 2011-08-14 DIAGNOSIS — M6281 Muscle weakness (generalized): Secondary | ICD-10-CM | POA: Diagnosis not present

## 2011-08-14 DIAGNOSIS — Z79899 Other long term (current) drug therapy: Secondary | ICD-10-CM | POA: Diagnosis not present

## 2011-08-14 DIAGNOSIS — Z8614 Personal history of Methicillin resistant Staphylococcus aureus infection: Secondary | ICD-10-CM | POA: Diagnosis not present

## 2011-08-14 DIAGNOSIS — R269 Unspecified abnormalities of gait and mobility: Secondary | ICD-10-CM | POA: Diagnosis not present

## 2011-08-14 DIAGNOSIS — K219 Gastro-esophageal reflux disease without esophagitis: Secondary | ICD-10-CM | POA: Diagnosis present

## 2011-08-14 DIAGNOSIS — Z96649 Presence of unspecified artificial hip joint: Secondary | ICD-10-CM | POA: Diagnosis not present

## 2011-08-14 DIAGNOSIS — I700 Atherosclerosis of aorta: Secondary | ICD-10-CM | POA: Diagnosis present

## 2011-08-15 LAB — BASIC METABOLIC PANEL
Anion Gap: 9 (ref 7–16)
Calcium, Total: 7.6 mg/dL — ABNORMAL LOW (ref 8.5–10.1)
Co2: 29 mmol/L (ref 21–32)
Creatinine: 0.73 mg/dL (ref 0.60–1.30)
EGFR (African American): >60
EGFR (Non-African Amer.): >60
Potassium: 3.8 mmol/L (ref 3.5–5.1)

## 2011-08-15 LAB — HEMATOCRIT: HCT: 26.5 % — ABNORMAL LOW (ref 40.0–52.0)

## 2011-08-17 LAB — HEMOGLOBIN: HGB: 8.5 g/dL — ABNORMAL LOW (ref 13.0–18.0)

## 2011-08-17 LAB — URIC ACID: Uric Acid: 3.1 mg/dL — ABNORMAL LOW (ref 3.5–7.2)

## 2011-08-18 DIAGNOSIS — M8700 Idiopathic aseptic necrosis of unspecified bone: Secondary | ICD-10-CM | POA: Diagnosis not present

## 2011-08-18 DIAGNOSIS — Z471 Aftercare following joint replacement surgery: Secondary | ICD-10-CM | POA: Diagnosis not present

## 2011-08-18 DIAGNOSIS — D649 Anemia, unspecified: Secondary | ICD-10-CM | POA: Diagnosis not present

## 2011-08-18 DIAGNOSIS — K219 Gastro-esophageal reflux disease without esophagitis: Secondary | ICD-10-CM | POA: Diagnosis not present

## 2011-08-18 DIAGNOSIS — T84099A Other mechanical complication of unspecified internal joint prosthesis, initial encounter: Secondary | ICD-10-CM | POA: Diagnosis not present

## 2011-08-18 DIAGNOSIS — S73006A Unspecified dislocation of unspecified hip, initial encounter: Secondary | ICD-10-CM | POA: Diagnosis not present

## 2011-08-18 DIAGNOSIS — T84069A Wear of articular bearing surface of unspecified internal prosthetic joint, initial encounter: Secondary | ICD-10-CM | POA: Diagnosis not present

## 2011-08-18 DIAGNOSIS — I517 Cardiomegaly: Secondary | ICD-10-CM | POA: Diagnosis not present

## 2011-08-18 DIAGNOSIS — Z96649 Presence of unspecified artificial hip joint: Secondary | ICD-10-CM | POA: Diagnosis not present

## 2011-08-18 DIAGNOSIS — G20A1 Parkinson's disease without dyskinesia, without mention of fluctuations: Secondary | ICD-10-CM | POA: Diagnosis not present

## 2011-08-18 DIAGNOSIS — Z87442 Personal history of urinary calculi: Secondary | ICD-10-CM | POA: Diagnosis not present

## 2011-08-18 DIAGNOSIS — M6281 Muscle weakness (generalized): Secondary | ICD-10-CM | POA: Diagnosis not present

## 2011-08-18 DIAGNOSIS — R269 Unspecified abnormalities of gait and mobility: Secondary | ICD-10-CM | POA: Diagnosis not present

## 2011-08-18 DIAGNOSIS — I359 Nonrheumatic aortic valve disorder, unspecified: Secondary | ICD-10-CM | POA: Diagnosis not present

## 2011-08-18 DIAGNOSIS — M1990 Unspecified osteoarthritis, unspecified site: Secondary | ICD-10-CM | POA: Diagnosis not present

## 2011-08-18 DIAGNOSIS — T84029A Dislocation of unspecified internal joint prosthesis, initial encounter: Secondary | ICD-10-CM | POA: Diagnosis not present

## 2011-08-18 DIAGNOSIS — G20 Parkinson's disease: Secondary | ICD-10-CM | POA: Diagnosis not present

## 2011-08-18 DIAGNOSIS — Z79899 Other long term (current) drug therapy: Secondary | ICD-10-CM | POA: Diagnosis not present

## 2011-08-18 DIAGNOSIS — Z01811 Encounter for preprocedural respiratory examination: Secondary | ICD-10-CM | POA: Diagnosis not present

## 2011-08-18 DIAGNOSIS — Z880 Allergy status to penicillin: Secondary | ICD-10-CM | POA: Diagnosis not present

## 2011-08-18 DIAGNOSIS — M1610 Unilateral primary osteoarthritis, unspecified hip: Secondary | ICD-10-CM | POA: Diagnosis not present

## 2011-08-18 DIAGNOSIS — A4902 Methicillin resistant Staphylococcus aureus infection, unspecified site: Secondary | ICD-10-CM | POA: Diagnosis not present

## 2011-08-18 DIAGNOSIS — M8708 Idiopathic aseptic necrosis of bone, other site: Secondary | ICD-10-CM | POA: Diagnosis not present

## 2011-08-18 LAB — PATHOLOGY REPORT

## 2011-08-18 LAB — WOUND CULTURE

## 2011-08-20 DIAGNOSIS — M8708 Idiopathic aseptic necrosis of bone, other site: Secondary | ICD-10-CM

## 2011-08-20 DIAGNOSIS — G20 Parkinson's disease: Secondary | ICD-10-CM

## 2011-08-20 DIAGNOSIS — K219 Gastro-esophageal reflux disease without esophagitis: Secondary | ICD-10-CM

## 2011-08-20 DIAGNOSIS — I359 Nonrheumatic aortic valve disorder, unspecified: Secondary | ICD-10-CM

## 2011-08-21 DIAGNOSIS — Z01811 Encounter for preprocedural respiratory examination: Secondary | ICD-10-CM | POA: Diagnosis not present

## 2011-08-21 DIAGNOSIS — M1990 Unspecified osteoarthritis, unspecified site: Secondary | ICD-10-CM | POA: Diagnosis not present

## 2011-08-21 DIAGNOSIS — G20 Parkinson's disease: Secondary | ICD-10-CM | POA: Diagnosis not present

## 2011-08-21 DIAGNOSIS — T84029A Dislocation of unspecified internal joint prosthesis, initial encounter: Secondary | ICD-10-CM | POA: Diagnosis not present

## 2011-08-21 DIAGNOSIS — I517 Cardiomegaly: Secondary | ICD-10-CM | POA: Diagnosis not present

## 2011-08-21 DIAGNOSIS — Z96649 Presence of unspecified artificial hip joint: Secondary | ICD-10-CM | POA: Diagnosis not present

## 2011-08-21 DIAGNOSIS — Z471 Aftercare following joint replacement surgery: Secondary | ICD-10-CM | POA: Diagnosis not present

## 2011-08-21 DIAGNOSIS — K219 Gastro-esophageal reflux disease without esophagitis: Secondary | ICD-10-CM | POA: Diagnosis not present

## 2011-08-21 DIAGNOSIS — T84099A Other mechanical complication of unspecified internal joint prosthesis, initial encounter: Secondary | ICD-10-CM | POA: Diagnosis not present

## 2011-08-21 DIAGNOSIS — M1610 Unilateral primary osteoarthritis, unspecified hip: Secondary | ICD-10-CM | POA: Diagnosis not present

## 2011-08-21 DIAGNOSIS — T84069A Wear of articular bearing surface of unspecified internal prosthetic joint, initial encounter: Secondary | ICD-10-CM | POA: Diagnosis not present

## 2011-08-21 DIAGNOSIS — D649 Anemia, unspecified: Secondary | ICD-10-CM | POA: Diagnosis not present

## 2011-08-21 DIAGNOSIS — S73006A Unspecified dislocation of unspecified hip, initial encounter: Secondary | ICD-10-CM | POA: Diagnosis not present

## 2011-08-21 DIAGNOSIS — M8700 Idiopathic aseptic necrosis of unspecified bone: Secondary | ICD-10-CM | POA: Diagnosis not present

## 2011-08-25 DIAGNOSIS — S73006A Unspecified dislocation of unspecified hip, initial encounter: Secondary | ICD-10-CM

## 2011-08-25 DIAGNOSIS — M1610 Unilateral primary osteoarthritis, unspecified hip: Secondary | ICD-10-CM | POA: Diagnosis not present

## 2011-08-26 ENCOUNTER — Encounter (HOSPITAL_COMMUNITY): Payer: Self-pay | Admitting: *Deleted

## 2011-08-26 DIAGNOSIS — T84099A Other mechanical complication of unspecified internal joint prosthesis, initial encounter: Secondary | ICD-10-CM | POA: Diagnosis not present

## 2011-08-26 MED ORDER — TRANEXAMIC ACID 100 MG/ML IV SOLN
15.0000 mg/kg | Freq: Once | INTRAVENOUS | Status: AC
  Administered 2011-08-27: 1333.5 mg via INTRAVENOUS
  Filled 2011-08-26: qty 13.34

## 2011-08-26 NOTE — H&P (Signed)
Casey Tucker is an 71 y.o. male.    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 male complaining of left hip pain for 1 week.  Pt has a history of a left total hip arthroplasty. Pt states that he was ok for about a week, but then during an episode of PT he felt a pop and had instant and continuous severe pain since. X-rays in the clinic show left hip dislocation and disassociation of the acetabular component from the bone.  The pt was originally had a diagnosis of AVN of the left hip. He had a left total hip arthroplasty on 08/14/2011.   Various options are discussed with the patient. Risks, benefits and expectations were discussed with the patient. Patient understand the risks, benefits and expectations and wishes to proceed with surgery.   PCP:  Tillman Abide, MD, MD  D/C Plans:  Home with HHPT  Post-op Meds:  Rx given for ASA, Iron, Colace and MiraLax. Pt already has Flexeril for muscle spasms.  Tranexamic Acid:  To be given  PMH: Past Medical History  Diagnosis Date  . GERD (gastroesophageal reflux disease) Pre 2002    Mild  . Ruptured appendix teens  . History of kidney stones   . 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 Parkinson disease 2004    Slowly progressive  . Arthritis     left hip     PSH: Past Surgical History  Procedure Date  . Neuro evaluation 12/19/2004    Dr. Sandria Manly, benign tremor  . Doppler echocardiography 01/10/2009    LV NML Mild LVH EF 60-65% aortic sclerosis w/0 stenosis   . Appendectomy   . Joint replacement     left hip replacement 08/14/11/[place]     Social History:  reports that he has never smoked. He has never used smokeless tobacco. He reports that he drinks about 3.5 ounces of alcohol per week. He reports that he does not use illicit drugs.  Allergies:  Allergies  Allergen Reactions  . Amoxicillin   . Celebrex (Celecoxib)     . Demerol Other (See Comments)    Hallucinations   . Penicillins     REACTION: RASH    Medications: No current facility-administered medications for this encounter.   Current Outpatient Prescriptions  Medication Sig Dispense Refill  . acetaminophen (TYLENOL) 325 MG tablet Take 325-650 mg by mouth every 4 (four) hours as needed. pain      . ALPRAZolam (XANAX) 0.5 MG tablet Take 0.5 mg by mouth 2 (two) times daily.      . carbidopa-levodopa (SINEMET) 25-100 MG per tablet 1 tablet 3 (three) times daily. Prior to meals  1/2 hour      . cyclobenzaprine (FLEXERIL) 10 MG tablet Take 10 mg by mouth at bedtime.      . ferrous gluconate (FERGON) 246 (28 FE) MG tablet Take 246 mg by mouth 3 (three) times daily with meals.      . magnesium hydroxide (MILK OF MAGNESIA) 400 MG/5ML suspension Take 30 mLs by mouth daily as needed. laxative      . oxycodone (OXY-IR) 5 MG capsule Take 5-10 mg by mouth every 3 (three) hours as needed. pain      . polyethylene glycol (MIRALAX / GLYCOLAX) packet Take 17 g by mouth daily.      . pramipexole (MIRAPEX) 0.5 MG tablet Take 0.5 mg by mouth  3 (three) times daily.      . pramipexole (MIRAPEX) 1 MG tablet Take 1 mg by mouth at bedtime.      . Rasagiline Mesylate (AZILECT) 1 MG TABS Take 1 mg by mouth daily.       Marland Kitchen senna (SENOKOT) 8.6 MG tablet Take 1 tablet by mouth 2 (two) times daily.        ROS: Review of Systems  Constitutional: Negative.   HENT: Negative.   Eyes: Negative.   Respiratory: Negative.   Cardiovascular: Negative.   Gastrointestinal: Negative.   Genitourinary: Negative.   Musculoskeletal: Positive for joint pain.  Skin: Negative.   Neurological: Negative.   Endo/Heme/Allergies: Negative.   Psychiatric/Behavioral: Negative.      Physican Exam: Physical Exam  Constitutional: He is oriented to person, place, and time and well-developed, well-nourished, and in no distress.  HENT:  Head: Normocephalic and atraumatic.  Nose: Nose  normal.  Mouth/Throat: Oropharynx is clear and moist.  Eyes: Pupils are equal, round, and reactive to light.  Neck: Neck supple. No JVD present. No tracheal deviation present. No thyromegaly present.  Cardiovascular: Normal rate, regular rhythm, normal heart sounds and intact distal pulses.   Pulmonary/Chest: Effort normal and breath sounds normal. No stridor.  Abdominal: Soft. There is no tenderness. There is no guarding.  Musculoskeletal:       Left hip: He exhibits decreased range of motion (limited in test), decreased strength (limited in test), tenderness, bony tenderness and swelling.  Lymphadenopathy:    He has no cervical adenopathy.  Neurological: He is alert and oriented to person, place, and time.  Skin: Skin is warm and dry.  Psychiatric: Affect normal.     Assessment/Plan Assessment: Failed left total hip arthroplasty  Plan: Patient will undergo a left total hip revision on 08/26/2011 at [facility]. Risks benefits and expectation were discussed with the patient. Patient understand risks, benefits and expectation and wishes to proceed.   Anastasio Auerbach Orin Eberwein   PAC  08/26/2011, 6:09 PM

## 2011-08-27 ENCOUNTER — Encounter (HOSPITAL_COMMUNITY): Payer: Self-pay | Admitting: Anesthesiology

## 2011-08-27 ENCOUNTER — Inpatient Hospital Stay (HOSPITAL_COMMUNITY): Payer: Medicare Other

## 2011-08-27 ENCOUNTER — Inpatient Hospital Stay (HOSPITAL_COMMUNITY)
Admission: RE | Admit: 2011-08-27 | Discharge: 2011-08-29 | DRG: 468 | Disposition: A | Discharge status: Home Health | Payer: Medicare Other | Source: Ambulatory Visit | Attending: Orthopedic Surgery | Admitting: Orthopedic Surgery

## 2011-08-27 ENCOUNTER — Ambulatory Visit (HOSPITAL_COMMUNITY): Payer: Medicare Other | Admitting: Anesthesiology

## 2011-08-27 ENCOUNTER — Encounter (HOSPITAL_COMMUNITY)
Admission: RE | Disposition: A | Discharge status: Home Health | Payer: Self-pay | Source: Ambulatory Visit | Attending: Orthopedic Surgery

## 2011-08-27 ENCOUNTER — Ambulatory Visit (HOSPITAL_COMMUNITY): Payer: Medicare Other

## 2011-08-27 ENCOUNTER — Encounter (HOSPITAL_COMMUNITY): Payer: Self-pay | Admitting: *Deleted

## 2011-08-27 DIAGNOSIS — Y831 Surgical operation with implant of artificial internal device as the cause of abnormal reaction of the patient, or of later complication, without mention of misadventure at the time of the procedure: Secondary | ICD-10-CM | POA: Diagnosis present

## 2011-08-27 DIAGNOSIS — K219 Gastro-esophageal reflux disease without esophagitis: Secondary | ICD-10-CM | POA: Diagnosis present

## 2011-08-27 DIAGNOSIS — T84029A Dislocation of unspecified internal joint prosthesis, initial encounter: Principal | ICD-10-CM | POA: Diagnosis present

## 2011-08-27 DIAGNOSIS — T84069A Wear of articular bearing surface of unspecified internal prosthetic joint, initial encounter: Secondary | ICD-10-CM | POA: Diagnosis not present

## 2011-08-27 DIAGNOSIS — G20 Parkinson's disease: Secondary | ICD-10-CM | POA: Diagnosis present

## 2011-08-27 DIAGNOSIS — Z01811 Encounter for preprocedural respiratory examination: Secondary | ICD-10-CM | POA: Diagnosis not present

## 2011-08-27 DIAGNOSIS — I517 Cardiomegaly: Secondary | ICD-10-CM | POA: Diagnosis not present

## 2011-08-27 DIAGNOSIS — Z96649 Presence of unspecified artificial hip joint: Secondary | ICD-10-CM | POA: Diagnosis not present

## 2011-08-27 DIAGNOSIS — D649 Anemia, unspecified: Secondary | ICD-10-CM | POA: Diagnosis not present

## 2011-08-27 DIAGNOSIS — Z87442 Personal history of urinary calculi: Secondary | ICD-10-CM

## 2011-08-27 DIAGNOSIS — Z79899 Other long term (current) drug therapy: Secondary | ICD-10-CM | POA: Diagnosis not present

## 2011-08-27 DIAGNOSIS — Z880 Allergy status to penicillin: Secondary | ICD-10-CM | POA: Diagnosis not present

## 2011-08-27 DIAGNOSIS — Z471 Aftercare following joint replacement surgery: Secondary | ICD-10-CM | POA: Diagnosis not present

## 2011-08-27 DIAGNOSIS — G20A1 Parkinson's disease without dyskinesia, without mention of fluctuations: Secondary | ICD-10-CM | POA: Diagnosis present

## 2011-08-27 HISTORY — PX: TOTAL HIP REVISION: SHX763

## 2011-08-27 HISTORY — DX: Unspecified osteoarthritis, unspecified site: M19.90

## 2011-08-27 LAB — SURGICAL PCR SCREEN
MRSA, PCR: POSITIVE — AB
Staphylococcus aureus: POSITIVE — AB

## 2011-08-27 LAB — CBC
HCT: 29 % — ABNORMAL LOW (ref 39.0–52.0)
Hemoglobin: 9.7 g/dL — ABNORMAL LOW (ref 13.0–17.0)
MCH: 34.6 pg — ABNORMAL HIGH (ref 26.0–34.0)
MCHC: 33.4 g/dL (ref 30.0–36.0)
Platelets: 474 10*3/uL — ABNORMAL HIGH (ref 150–400)
RDW: 13.3 % (ref 11.5–15.5)

## 2011-08-27 LAB — TYPE AND SCREEN: ABO/RH(D): A NEG

## 2011-08-27 LAB — BASIC METABOLIC PANEL
BUN: 13 mg/dL (ref 6–23)
CO2: 29 mEq/L (ref 19–32)
Calcium: 9 mg/dL (ref 8.4–10.5)
Chloride: 98 mEq/L (ref 96–112)
Creatinine, Ser: 0.78 mg/dL (ref 0.50–1.35)
GFR calc Af Amer: >90 mL/min (ref >90)
GFR calc non Af Amer: >90 mL/min (ref >90)

## 2011-08-27 LAB — DIFFERENTIAL
Basophils Absolute: 0 10*3/uL (ref 0.0–0.1)
Basophils Relative: 1 % (ref 0–1)
Eosinophils Absolute: 0.2 10*3/uL (ref 0.0–0.7)
Monocytes Absolute: 0.5 10*3/uL (ref 0.1–1.0)
Monocytes Relative: 11 % (ref 3–12)
Neutro Abs: 3 10*3/uL (ref 1.7–7.7)
Neutrophils Relative %: 69 % (ref 43–77)

## 2011-08-27 LAB — ABO/RH: ABO/RH(D): A NEG

## 2011-08-27 LAB — PROTIME-INR: Prothrombin Time: 14.3 seconds (ref 11.6–15.2)

## 2011-08-27 SURGERY — TOTAL HIP REVISION
Anesthesia: General | Site: Hip | Laterality: Left | Wound class: Clean

## 2011-08-27 MED ORDER — SODIUM CHLORIDE 0.9 % IR SOLN
Status: DC | PRN
  Administered 2011-08-27: 3000 mL

## 2011-08-27 MED ORDER — CEFAZOLIN SODIUM 1-5 GM-% IV SOLN
1.0000 g | INTRAVENOUS | Status: AC
  Administered 2011-08-27: 2 g via INTRAVENOUS

## 2011-08-27 MED ORDER — PHENOL 1.4 % MT LIQD: 1.0000 | OROMUCOSAL | Status: DC | PRN

## 2011-08-27 MED ORDER — POLYETHYLENE GLYCOL 3350 17 G PO PACK
17.0000 g | PACK | Freq: Two times a day (BID) | ORAL | Status: DC
  Administered 2011-08-27 – 2011-08-28 (×3): 17 g via ORAL
  Filled 2011-08-27 (×5): qty 1

## 2011-08-27 MED ORDER — VANCOMYCIN HCL IN DEXTROSE 1-5 GM/200ML-% IV SOLN
1000.0000 mg | Freq: Once | INTRAVENOUS | Status: AC
  Administered 2011-08-28: 1000 mg via INTRAVENOUS
  Filled 2011-08-27: qty 200

## 2011-08-27 MED ORDER — DIPHENHYDRAMINE HCL 25 MG PO CAPS: 25.0000 mg | ORAL_CAPSULE | Freq: Four times a day (QID) | ORAL | Status: DC | PRN

## 2011-08-27 MED ORDER — METOCLOPRAMIDE HCL 10 MG PO TABS: 5.0000 mg | ORAL_TABLET | Freq: Three times a day (TID) | ORAL | Status: DC | PRN

## 2011-08-27 MED ORDER — RASAGILINE MESYLATE 1 MG PO TABS
1.0000 mg | ORAL_TABLET | Freq: Every day | ORAL | Status: DC
  Administered 2011-08-28 – 2011-08-29 (×2): 1 mg via ORAL

## 2011-08-27 MED ORDER — HYDROCODONE-ACETAMINOPHEN 7.5-325 MG PO TABS
1.0000 | ORAL_TABLET | ORAL | Status: DC
  Administered 2011-08-27 – 2011-08-28 (×3): 1 via ORAL
  Administered 2011-08-28 (×3): 2 via ORAL
  Administered 2011-08-28 – 2011-08-29 (×4): 1 via ORAL
  Filled 2011-08-27: qty 2
  Filled 2011-08-27 (×4): qty 1
  Filled 2011-08-27 (×3): qty 2
  Filled 2011-08-27: qty 1
  Filled 2011-08-27: qty 2
  Filled 2011-08-27: qty 1

## 2011-08-27 MED ORDER — PRAMIPEXOLE DIHYDROCHLORIDE 0.25 MG PO TABS
0.5000 mg | ORAL_TABLET | Freq: Three times a day (TID) | ORAL | Status: DC
  Administered 2011-08-27: 0.25 mg via ORAL
  Administered 2011-08-28 – 2011-08-29 (×4): 0.5 mg via ORAL
  Filled 2011-08-27 (×7): qty 2

## 2011-08-27 MED ORDER — ACETAMINOPHEN 650 MG RE SUPP: 650.0000 mg | Freq: Four times a day (QID) | RECTAL | Status: DC | PRN

## 2011-08-27 MED ORDER — METHOCARBAMOL 500 MG PO TABS
500.0000 mg | ORAL_TABLET | Freq: Four times a day (QID) | ORAL | Status: DC | PRN
  Administered 2011-08-28: 500 mg via ORAL
  Filled 2011-08-27: qty 1

## 2011-08-27 MED ORDER — RIVAROXABAN 10 MG PO TABS
10.0000 mg | ORAL_TABLET | ORAL | Status: DC
  Administered 2011-08-28 – 2011-08-29 (×2): 10 mg via ORAL
  Filled 2011-08-27 (×2): qty 1

## 2011-08-27 MED ORDER — CEFAZOLIN SODIUM-DEXTROSE 2-3 GM-% IV SOLR
INTRAVENOUS | Status: AC
  Filled 2011-08-27: qty 50

## 2011-08-27 MED ORDER — PROMETHAZINE HCL 25 MG/ML IJ SOLN: 6.2500 mg | INTRAMUSCULAR | Status: DC | PRN

## 2011-08-27 MED ORDER — SODIUM CHLORIDE 0.9 % IV SOLN
100.0000 mL/h | INTRAVENOUS | Status: DC
  Administered 2011-08-27 – 2011-08-28 (×2): 100 mL/h via INTRAVENOUS
  Filled 2011-08-27 (×6): qty 1000

## 2011-08-27 MED ORDER — CARBIDOPA-LEVODOPA 25-100 MG PO TABS
1.0000 | ORAL_TABLET | Freq: Three times a day (TID) | ORAL | Status: DC
  Administered 2011-08-27 – 2011-08-29 (×5): 1 via ORAL
  Filled 2011-08-27 (×8): qty 1

## 2011-08-27 MED ORDER — MORPHINE SULFATE 0.5 MG/ML IJ SOLN
INTRAMUSCULAR | Status: AC
  Filled 2011-08-27: qty 10

## 2011-08-27 MED ORDER — MORPHINE SULFATE (PF) 0.5 MG/ML IJ SOLN
INTRAMUSCULAR | Status: DC | PRN
  Administered 2011-08-27: 100 ug via EPIDURAL

## 2011-08-27 MED ORDER — DEXAMETHASONE SODIUM PHOSPHATE 4 MG/ML IJ SOLN
INTRAMUSCULAR | Status: DC | PRN
  Administered 2011-08-27: 10 mg via INTRAVENOUS

## 2011-08-27 MED ORDER — CEFAZOLIN SODIUM 1-5 GM-% IV SOLN
1.0000 g | Freq: Four times a day (QID) | INTRAVENOUS | Status: AC
  Administered 2011-08-27 – 2011-08-28 (×3): 1 g via INTRAVENOUS
  Filled 2011-08-27 (×3): qty 50

## 2011-08-27 MED ORDER — DOCUSATE SODIUM 100 MG PO CAPS
100.0000 mg | ORAL_CAPSULE | Freq: Two times a day (BID) | ORAL | Status: DC
  Administered 2011-08-27 – 2011-08-29 (×4): 100 mg via ORAL
  Filled 2011-08-27 (×5): qty 1

## 2011-08-27 MED ORDER — MIDAZOLAM HCL 5 MG/5ML IJ SOLN
INTRAMUSCULAR | Status: DC | PRN
  Administered 2011-08-27: 2 mg via INTRAVENOUS

## 2011-08-27 MED ORDER — ONDANSETRON HCL 4 MG/2ML IJ SOLN: 4.0000 mg | Freq: Four times a day (QID) | INTRAMUSCULAR | Status: DC | PRN

## 2011-08-27 MED ORDER — HYDROMORPHONE HCL PF 1 MG/ML IJ SOLN: 0.5000 mg | INTRAMUSCULAR | Status: DC | PRN

## 2011-08-27 MED ORDER — ONDANSETRON HCL 4 MG PO TABS: 4.0000 mg | ORAL_TABLET | Freq: Four times a day (QID) | ORAL | Status: DC | PRN

## 2011-08-27 MED ORDER — MUPIROCIN 2 % EX OINT
TOPICAL_OINTMENT | Freq: Two times a day (BID) | CUTANEOUS | Status: DC
  Administered 2011-08-27 – 2011-08-28 (×2): via NASAL
  Administered 2011-08-28: 1 via NASAL

## 2011-08-27 MED ORDER — FLEET ENEMA 7-19 GM/118ML RE ENEM: 1.0000 | ENEMA | Freq: Once | RECTAL | Status: AC | PRN

## 2011-08-27 MED ORDER — BISACODYL 5 MG PO TBEC: 5.0000 mg | DELAYED_RELEASE_TABLET | Freq: Every day | ORAL | Status: DC | PRN

## 2011-08-27 MED ORDER — CEFAZOLIN SODIUM 1-5 GM-% IV SOLN: 1.0000 g | INTRAVENOUS | Status: DC

## 2011-08-27 MED ORDER — PRAMIPEXOLE DIHYDROCHLORIDE 1 MG PO TABS
1.0000 mg | ORAL_TABLET | Freq: Every day | ORAL | Status: DC
  Administered 2011-08-27 – 2011-08-28 (×2): 1 mg via ORAL
  Filled 2011-08-27 (×4): qty 1

## 2011-08-27 MED ORDER — MUPIROCIN 2 % EX OINT
TOPICAL_OINTMENT | CUTANEOUS | Status: AC
  Filled 2011-08-27: qty 22

## 2011-08-27 MED ORDER — PROPOFOL 10 MG/ML IV EMUL
INTRAVENOUS | Status: DC | PRN
  Administered 2011-08-27: 50 ug/kg/min via INTRAVENOUS

## 2011-08-27 MED ORDER — BUPIVACAINE IN DEXTROSE 0.75-8.25 % IT SOLN
INTRATHECAL | Status: DC | PRN
  Administered 2011-08-27: 15 mg via INTRATHECAL

## 2011-08-27 MED ORDER — FERROUS SULFATE 325 (65 FE) MG PO TABS
325.0000 mg | ORAL_TABLET | Freq: Three times a day (TID) | ORAL | Status: DC
  Administered 2011-08-27 – 2011-08-29 (×5): 325 mg via ORAL
  Filled 2011-08-27 (×7): qty 1

## 2011-08-27 MED ORDER — METOCLOPRAMIDE HCL 5 MG/ML IJ SOLN: 5.0000 mg | Freq: Three times a day (TID) | INTRAMUSCULAR | Status: DC | PRN

## 2011-08-27 MED ORDER — CHLORHEXIDINE GLUCONATE 4 % EX LIQD: 60.0000 mL | Freq: Once | CUTANEOUS | Status: DC

## 2011-08-27 MED ORDER — MENTHOL 3 MG MT LOZG: 1.0000 | LOZENGE | OROMUCOSAL | Status: DC | PRN

## 2011-08-27 MED ORDER — BUPIVACAINE LIPOSOME 1.3 % IJ SUSP
20.0000 mL | Freq: Once | INTRAMUSCULAR | Status: DC
  Filled 2011-08-27: qty 20

## 2011-08-27 MED ORDER — EPHEDRINE SULFATE 50 MG/ML IJ SOLN
INTRAMUSCULAR | Status: DC | PRN
  Administered 2011-08-27 (×2): 10 mg via INTRAVENOUS

## 2011-08-27 MED ORDER — LACTATED RINGERS IV SOLN
INTRAVENOUS | Status: DC | PRN
  Administered 2011-08-27 (×4): via INTRAVENOUS

## 2011-08-27 MED ORDER — HYDROMORPHONE HCL PF 1 MG/ML IJ SOLN: 0.2500 mg | INTRAMUSCULAR | Status: DC | PRN

## 2011-08-27 MED ORDER — VANCOMYCIN HCL IN DEXTROSE 1-5 GM/200ML-% IV SOLN
INTRAVENOUS | Status: AC
  Filled 2011-08-27: qty 200

## 2011-08-27 MED ORDER — ACETAMINOPHEN 10 MG/ML IV SOLN
1000.0000 mg | Freq: Once | INTRAVENOUS | Status: DC
  Filled 2011-08-27: qty 100

## 2011-08-27 MED ORDER — DEXAMETHASONE SODIUM PHOSPHATE 10 MG/ML IJ SOLN
10.0000 mg | Freq: Once | INTRAMUSCULAR | Status: AC
  Administered 2011-08-28: 10 mg via INTRAVENOUS
  Filled 2011-08-27: qty 1

## 2011-08-27 MED ORDER — ACETAMINOPHEN 10 MG/ML IV SOLN
INTRAVENOUS | Status: DC | PRN
  Administered 2011-08-27: 1000 mg via INTRAVENOUS

## 2011-08-27 MED ORDER — DEXAMETHASONE SODIUM PHOSPHATE 10 MG/ML IJ SOLN
10.0000 mg | Freq: Once | INTRAMUSCULAR | Status: DC
  Filled 2011-08-27: qty 1

## 2011-08-27 MED ORDER — VANCOMYCIN HCL IN DEXTROSE 1-5 GM/200ML-% IV SOLN
1000.0000 mg | Freq: Once | INTRAVENOUS | Status: DC
Start: 2011-08-27 — End: 2011-08-27
  Administered 2011-08-27: 1000 mg via INTRAVENOUS
  Filled 2011-08-27: qty 200

## 2011-08-27 MED ORDER — ALUM & MAG HYDROXIDE-SIMETH 200-200-20 MG/5ML PO SUSP: 30.0000 mL | ORAL | Status: DC | PRN

## 2011-08-27 MED ORDER — ONDANSETRON HCL 4 MG/2ML IJ SOLN
INTRAMUSCULAR | Status: DC | PRN
  Administered 2011-08-27: 4 mg via INTRAVENOUS

## 2011-08-27 MED ORDER — DEXAMETHASONE SODIUM PHOSPHATE 10 MG/ML IJ SOLN: 10.0000 mg | Freq: Once | INTRAMUSCULAR | Status: DC

## 2011-08-27 MED ORDER — MEPERIDINE HCL 50 MG/ML IJ SOLN: 6.2500 mg | INTRAMUSCULAR | Status: DC | PRN

## 2011-08-27 MED ORDER — ALPRAZOLAM 0.5 MG PO TABS
0.5000 mg | ORAL_TABLET | Freq: Two times a day (BID) | ORAL | Status: DC
  Administered 2011-08-27 – 2011-08-28 (×3): 0.5 mg via ORAL
  Filled 2011-08-27 (×3): qty 1

## 2011-08-27 MED ORDER — PHENYLEPHRINE HCL 10 MG/ML IJ SOLN
10.0000 mg | INTRAVENOUS | Status: DC | PRN
  Administered 2011-08-27: 30 ug/min via INTRAVENOUS

## 2011-08-27 MED ORDER — ZOLPIDEM TARTRATE 5 MG PO TABS: 5.0000 mg | ORAL_TABLET | Freq: Every evening | ORAL | Status: DC | PRN

## 2011-08-27 MED ORDER — ACETAMINOPHEN 325 MG PO TABS: 650.0000 mg | ORAL_TABLET | Freq: Four times a day (QID) | ORAL | Status: DC | PRN

## 2011-08-27 MED ORDER — FENTANYL CITRATE 0.05 MG/ML IJ SOLN
INTRAMUSCULAR | Status: DC | PRN
  Administered 2011-08-27: 100 ug via INTRAVENOUS

## 2011-08-27 MED ORDER — SODIUM CHLORIDE 0.9 % IV SOLN: INTRAVENOUS | Status: DC

## 2011-08-27 MED ORDER — METHOCARBAMOL 100 MG/ML IJ SOLN
500.0000 mg | Freq: Four times a day (QID) | INTRAVENOUS | Status: DC | PRN
  Filled 2011-08-27: qty 5

## 2011-08-27 MED ORDER — ACETAMINOPHEN 10 MG/ML IV SOLN
INTRAVENOUS | Status: AC
Start: 2011-08-27 — End: 2011-08-27
  Filled 2011-08-27: qty 100

## 2011-08-27 SURGICAL SUPPLY — 59 items
ACETAB CUP W/GRIPTION 54 (Plate) ×2 IMPLANT
ADH SKN CLS APL DERMABOND .7 (GAUZE/BANDAGES/DRESSINGS) ×1
ASPHERE M SPEC 12/14 36 PLUS12 (Orthopedic Implant) ×1 IMPLANT
BAG SPEC THK2 15X12 ZIP CLS (MISCELLANEOUS)
BAG ZIPLOCK 12X15 (MISCELLANEOUS) ×1 IMPLANT
BLADE SAW SGTL 18X1.27X75 (BLADE) ×1 IMPLANT
BRUSH FEMORAL CANAL (MISCELLANEOUS) ×3 IMPLANT
CLOTH BEACON ORANGE TIMEOUT ST (SAFETY) ×2 IMPLANT
CUP ACETAB W/GRIPTION 54 (Plate) IMPLANT
DERMABOND ADVANCED (GAUZE/BANDAGES/DRESSINGS) ×1
DERMABOND ADVANCED .7 DNX12 (GAUZE/BANDAGES/DRESSINGS) IMPLANT
DRAPE INCISE IOBAN 85X60 (DRAPES) ×2 IMPLANT
DRAPE ORTHO SPLIT 77X108 STRL (DRAPES) ×4
DRAPE POUCH INSTRU U-SHP 10X18 (DRAPES) ×2 IMPLANT
DRAPE SURG 17X11 SM STRL (DRAPES) ×2 IMPLANT
DRAPE SURG ORHT 6 SPLT 77X108 (DRAPES) ×2 IMPLANT
DRAPE U-SHAPE 47X51 STRL (DRAPES) ×2 IMPLANT
DRSG EMULSION OIL 3X16 NADH (GAUZE/BANDAGES/DRESSINGS) ×1 IMPLANT
DRSG MEPILEX BORDER 4X4 (GAUZE/BANDAGES/DRESSINGS) ×2 IMPLANT
DRSG MEPILEX BORDER 4X8 (GAUZE/BANDAGES/DRESSINGS) ×2 IMPLANT
DRSG TEGADERM 4X4.75 (GAUZE/BANDAGES/DRESSINGS) ×1 IMPLANT
DURAPREP 26ML APPLICATOR (WOUND CARE) ×2 IMPLANT
ELECT BLADE TIP CTD 4 INCH (ELECTRODE) ×2 IMPLANT
ELECT REM PT RETURN 9FT ADLT (ELECTROSURGICAL) ×2
ELECTRODE REM PT RTRN 9FT ADLT (ELECTROSURGICAL) ×1 IMPLANT
ELIMINATOR HOLE APEX DEPUY (Hips) ×1 IMPLANT
EVACUATOR 1/8 PVC DRAIN (DRAIN) ×2 IMPLANT
FACESHIELD LNG OPTICON STERILE (SAFETY) ×8 IMPLANT
GAUZE SPONGE 2X2 8PLY STRL LF (GAUZE/BANDAGES/DRESSINGS) IMPLANT
GLOVE BIOGEL PI IND STRL 7.5 (GLOVE) ×1 IMPLANT
GLOVE BIOGEL PI IND STRL 8 (GLOVE) ×1 IMPLANT
GLOVE BIOGEL PI INDICATOR 7.5 (GLOVE) ×1
GLOVE BIOGEL PI INDICATOR 8 (GLOVE) ×1
GLOVE ORTHO TXT STRL SZ7.5 (GLOVE) ×4 IMPLANT
GLOVE SURG ORTHO 8.0 STRL STRW (GLOVE) ×2 IMPLANT
GOWN BRE IMP PREV XXLGXLNG (GOWN DISPOSABLE) ×2 IMPLANT
GOWN STRL NON-REIN LRG LVL3 (GOWN DISPOSABLE) ×2 IMPLANT
HANDPIECE INTERPULSE COAX TIP (DISPOSABLE) ×2
KIT BASIN OR (CUSTOM PROCEDURE TRAY) ×2 IMPLANT
LINER NEUTRAL 54X36MM PLUS 4 (Hips) ×1 IMPLANT
MANIFOLD NEPTUNE II (INSTRUMENTS) ×2 IMPLANT
NS IRRIG 1000ML POUR BTL (IV SOLUTION) ×4 IMPLANT
PACK TOTAL JOINT (CUSTOM PROCEDURE TRAY) ×2 IMPLANT
POSITIONER SURGICAL ARM (MISCELLANEOUS) ×2 IMPLANT
PRESSURIZER FEMORAL UNIV (MISCELLANEOUS) ×1 IMPLANT
SCREW 6.5MMX35MM (Screw) ×1 IMPLANT
SET HNDPC FAN SPRY TIP SCT (DISPOSABLE) ×1 IMPLANT
SPONGE GAUZE 2X2 STER 10/PKG (GAUZE/BANDAGES/DRESSINGS) ×1
SPONGE LAP 18X18 X RAY DECT (DISPOSABLE) ×2 IMPLANT
SPONGE LAP 4X18 X RAY DECT (DISPOSABLE) ×2 IMPLANT
STAPLER VISISTAT 35W (STAPLE) ×1 IMPLANT
SUCTION FRAZIER TIP 10 FR DISP (SUCTIONS) ×2 IMPLANT
SUT VIC AB 1 CT1 36 (SUTURE) ×6 IMPLANT
SUT VIC AB 2-0 CT1 27 (SUTURE) ×6
SUT VIC AB 2-0 CT1 TAPERPNT 27 (SUTURE) ×3 IMPLANT
TOWEL OR 17X26 10 PK STRL BLUE (TOWEL DISPOSABLE) ×4 IMPLANT
TOWER CARTRIDGE SMART MIX (DISPOSABLE) ×3 IMPLANT
TRAY FOLEY CATH 14FRSI W/METER (CATHETERS) ×2 IMPLANT
WATER STERILE IRR 1500ML POUR (IV SOLUTION) ×2 IMPLANT

## 2011-08-27 NOTE — Transfer of Care (Signed)
Immediate Anesthesia Transfer of Care Note  Patient: Casey Tucker  Procedure(s) Performed:  TOTAL HIP REVISION  Patient Location: PACU  Anesthesia Type: Regional  Level of Consciousness: awake and alert   Airway & Oxygen Therapy: Patient Spontanous Breathing and Patient connected to face mask oxygen  Post-op Assessment: Report given to PACU RN and Post -op Vital signs reviewed and stable  Post vital signs: Reviewed and stable  Complications: No apparent anesthesia complications2

## 2011-08-27 NOTE — Anesthesia Preprocedure Evaluation (Addendum)
Anesthesia Evaluation  Patient identified by MRN, date of birth, ID band Patient awake    Reviewed: Allergy & Precautions, H&P , NPO status , Patient's Chart, lab work & pertinent test results  Airway Mallampati: II TM Distance: >3 FB Neck ROM: Full    Dental No notable dental hx.    Pulmonary neg pulmonary ROS,  clear to auscultation  Pulmonary exam normal       Cardiovascular neg cardio ROS Regular Normal    Neuro/Psych Early parkinsons  Negative Neurological ROS  Negative Psych ROS   GI/Hepatic Neg liver ROS, GERD-  ,Gilberts DZ   Endo/Other  Negative Endocrine ROS  Renal/GU negative Renal ROS  Genitourinary negative   Musculoskeletal negative musculoskeletal ROS (+)   Abdominal   Peds negative pediatric ROS (+)  Hematology negative hematology ROS (+)   Anesthesia Other Findings   Reproductive/Obstetrics negative OB ROS                           Anesthesia Physical Anesthesia Plan  ASA: II  Anesthesia Plan: Spinal   Post-op Pain Management:    Induction: Intravenous  Airway Management Planned: Simple Face Mask  Additional Equipment:   Intra-op Plan:   Post-operative Plan:   Informed Consent: I have reviewed the patients History and Physical, chart, labs and discussed the procedure including the risks, benefits and alternatives for the proposed anesthesia with the patient or authorized representative who has indicated his/her understanding and acceptance.   Dental advisory given  Plan Discussed with: CRNA  Anesthesia Plan Comments:        Anesthesia Quick Evaluation

## 2011-08-27 NOTE — Anesthesia Procedure Notes (Signed)
Spinal  Patient location during procedure: OR End time: 08/27/2011 12:37 PM Staffing CRNA/Resident: Enriqueta Shutter Performed by: anesthesiologist  Preanesthetic Checklist Completed: patient identified, site marked, surgical consent, pre-op evaluation, timeout performed, IV checked, risks and benefits discussed and monitors and equipment checked Spinal Block Patient position: sitting Prep: Betadine Patient monitoring: heart rate, continuous pulse ox and blood pressure Injection technique: single-shot Needle Needle type: Spinocan and Sprotte  Needle gauge: 25 G Needle length: 5 cm Assessment Sensory level: T6 Additional Notes Expiration date of kit checked and confirmed. Patient tolerated procedure well, without complications. Clear CSF neg H neg P

## 2011-08-27 NOTE — Interval H&P Note (Signed)
History and Physical Interval Note:  08/27/2011 12:08 PM  Casey Tucker  has presented today for surgery, with the diagnosis of Failed Left Total Hip  The various methods of treatment have been discussed with the patient and family. After consideration of risks, benefits and other options for treatment, the patient has consented to  Procedure(s):LEFT TOTAL HIP REVISION as a surgical intervention .  The patients' history has been reviewed, patient examined, no change in status, stable for surgery.  I have reviewed the patients' chart and labs.  Questions were answered to the patient's satisfaction.     Shelda Pal

## 2011-08-27 NOTE — Brief Op Note (Signed)
08/27/2011  2:29 PM  PATIENT:  Casey Tucker  71 y.o. 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  POST-OPERATIVE DIAGNOSIS:  Failed Left Total Hip  PROCEDURE:  Procedure(s):Revision left TOTAL HIP REplacement, acetabular components, with new femoral head Depuy 54 Gription cup with 36+4 Altrex liner, 36+12 aSphere metal ball  SURGEON:  Surgeon(s): Shelda Pal, MD  PHYSICIAN ASSISTANT: Shelly Coss  ANESTHESIA:   spinal  EBL:  Total I/O In: 3000 [I.V.:3000] Out: 1000 [Urine:300; Blood:700]  BLOOD ADMINISTERED:none  DRAINS: (1 medium) Hemovact drain(s) in the left hip with  Suction Open   LOCAL MEDICATIONS USED:  NONE  SPECIMEN:  No Specimen  DISPOSITION OF SPECIMEN:  N/A  COUNTS:  YES  TOURNIQUET:  * No tourniquets in log *  DICTATION: .Other Dictation: Dictation Number [id number]  PLAN OF CARE: Admit to inpatient   PATIENT DISPOSITION:  PACU - hemodynamically stable.   Delay start of Pharmacological VTE agent (>24hrs) due to surgical blood loss or risk of bleeding:  {YES/NO/NOT APPLICABLE:20182

## 2011-08-28 ENCOUNTER — Inpatient Hospital Stay (HOSPITAL_COMMUNITY): Admission: RE | Admit: 2011-08-28 | Payer: Medicare Other | Source: Ambulatory Visit | Admitting: Orthopedic Surgery

## 2011-08-28 ENCOUNTER — Encounter (HOSPITAL_COMMUNITY): Admission: RE | Payer: Self-pay | Source: Ambulatory Visit

## 2011-08-28 LAB — CBC
MCH: 34.4 pg — ABNORMAL HIGH (ref 26.0–34.0)
MCHC: 33.6 g/dL (ref 30.0–36.0)
Platelets: 435 10*3/uL — ABNORMAL HIGH (ref 150–400)
RDW: 13.2 % (ref 11.5–15.5)

## 2011-08-28 LAB — BASIC METABOLIC PANEL
Calcium: 8.3 mg/dL — ABNORMAL LOW (ref 8.4–10.5)
GFR calc Af Amer: >90 mL/min (ref >90)
GFR calc non Af Amer: >90 mL/min (ref >90)
Sodium: 135 mEq/L (ref 135–145)

## 2011-08-28 SURGERY — ARTHROPLASTY, HIP, TOTAL,POSTERIOR APPROACH
Anesthesia: Choice | Site: Hip | Laterality: Left

## 2011-08-28 MED ORDER — DSS 100 MG PO CAPS: 100.0000 mg | ORAL_CAPSULE | Freq: Two times a day (BID) | ORAL | Status: AC

## 2011-08-28 MED ORDER — HYDROCODONE-ACETAMINOPHEN 7.5-325 MG PO TABS: 1.0000 | ORAL_TABLET | ORAL | Status: AC

## 2011-08-28 MED ORDER — ASPIRIN EC 325 MG PO TBEC: 325.0000 mg | DELAYED_RELEASE_TABLET | Freq: Two times a day (BID) | ORAL | Status: AC

## 2011-08-28 MED ORDER — DIPHENHYDRAMINE HCL 25 MG PO CAPS: 25.0000 mg | ORAL_CAPSULE | Freq: Four times a day (QID) | ORAL | Status: AC | PRN

## 2011-08-28 NOTE — Progress Notes (Signed)
  CARE MANAGEMENT NOTE 08/28/2011  Patient:  Casey Tucker, Casey Tucker   Account Number:  [id number]  Date Initiated:  08/28/2011  Documentation initiated by:  Colleen Can  Subjective/Objective Assessment:   dx failed left total hip; revision left total hip     Action/Plan:   CM spoke with patient. Plans are for patient to return to his home in [place] [facility] where spouse will be caregiver. Patient states he has all of DME including RW, crtuches, and 3N1   Anticipated DC Date:  08/30/2011   Anticipated DC Plan:  HOME W HOME HEALTH SERVICES  In-house referral  NA      DC Planning Services  CM consult      [facility] Choice  HOME HEALTH   Choice offered to / List presented to:  C-1 Patient        Status of service:  In process, will continue to follow Medicare Important Message given?   (If response is "NO", the following Medicare IM given date fields will be blank) Comments:  08/28/2011 [name] BSN CCM [phone] List of agencies given to patient for [facility] for patient to review. States he wants to get recommendations before he makes  choice. Wil advise CM for weekend to follow up for patient choice of agencies. List of agencies placed on shadow chart.

## 2011-08-28 NOTE — Evaluation (Signed)
Physical Therapy Evaluation Patient Details Name: Casey Tucker MRN: [id number] DOB: 09-17-1940 Today's Date: 08/28/2011  Problem List:  Patient Active Problem List  Diagnoses  . HYPERCHOLESTEROLEMIA  . GILBERT'S SYNDROME  . GERD  . Aortic sclerosis  . RENAL CALCULUS, HX OF  . Parkinson's disease  . Osteoarthrosis, hip  . S/P revision of left total hip    Past Medical History:  Past Medical History  Diagnosis Date  . GERD (gastroesophageal reflux disease) Pre 2002    Mild  . Ruptured appendix teens  . History of kidney stones   . 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 Parkinson disease 2004    Slowly progressive  . Arthritis     left hip    Past Surgical History:  Past Surgical History  Procedure Date  . Neuro evaluation 12/19/2004    Dr. Sandria Manly, benign tremor  . Doppler echocardiography 01/10/2009    LV NML Mild LVH EF 60-65% aortic sclerosis w/0 stenosis   . Appendectomy   . Joint replacement     left hip replacement 08/14/11/[place]     PT Assessment/Plan/Recommendation PT Assessment Clinical Impression Statement: Pt with L THR revision presents with decreased L LE strength/ROM and limited functional mobility as a result of procedure as well as history of Parkinson's disease.Marland Kitchen  Pt will benefit from skilled PT intervention to maximize IND for d/c home with family assist. PT Recommendation/Assessment: Patient will need skilled PT in the acute care venue PT Problem List: Decreased strength;Decreased range of motion;Decreased activity tolerance;Decreased balance;Decreased mobility;Decreased knowledge of use of DME;Pain;Decreased coordination PT Therapy Diagnosis : Difficulty walking PT Plan PT Frequency: 7X/week PT Treatment/Interventions: DME instruction;Gait training;Stair training;Functional mobility training;Therapeutic activities;Therapeutic exercise;Patient/family education PT  Recommendation Recommendations for Other Services: OT consult Follow Up Recommendations: Home health PT Equipment Recommended: None recommended by PT PT Goals  Acute Rehab PT Goals PT Goal Formulation: With patient Time For Goal Achievement: 7 days Pt will go Supine/Side to Sit: with supervision PT Goal: Supine/Side to Sit - Progress: Goal set today Pt will go Sit to Supine/Side: with supervision PT Goal: Sit to Supine/Side - Progress: Goal set today Pt will go Sit to Stand: with supervision PT Goal: Sit to Stand - Progress: Goal set today Pt will go Stand to Sit: with supervision PT Goal: Stand to Sit - Progress: Goal set today Pt will Ambulate: >150 feet;with supervision;with rolling walker PT Goal: Ambulate - Progress: Goal set today Pt will Go Up / Down Stairs: 1-2 stairs;with min assist;with least restrictive assistive device PT Goal: Up/Down Stairs - Progress: Goal set today  PT Evaluation Precautions/Restrictions  Precautions Precautions: Posterior Hip Precaution Booklet Issued:  (sign hung in room) Restrictions Weight Bearing Restrictions: No Other Position/Activity Restrictions: WBAT Prior Functioning  Home Living Lives With: Spouse Receives Help From: Family Type of Home: House Home Layout: One level Home Access: Stairs to enter Entrance Stairs-Rails: Left Entrance Stairs-Number of Steps: 2 Home Adaptive Equipment: Walker - rolling Prior Function Level of Independence: Independent with basic ADLs;Independent with transfers;Requires assistive device for independence Able to Take Stairs?: Yes Cognition Cognition Arousal/Alertness: Awake/alert Overall Cognitive Status: Appears within functional limits for tasks assessed Orientation Level: Oriented X4 Sensation/Coordination Coordination Gross Motor Movements are Fluid and Coordinated:  (Tremor noted R UE and LE secondary to Parkinsons) Extremity Assessment RUE Assessment RUE Assessment: Within Functional  Limits LUE Assessment LUE Assessment: Within Functional Limits RLE Assessment RLE Assessment: Exceptions  to [facility] (Mild control issues noted with movement secondary to Parkins) LLE Assessment LLE Assessment: Exceptions to WFL (ROM WFL following THP; 2+/5 hip, 3/5 quads) Mobility (including Balance) Bed Mobility Bed Mobility: Yes Supine to Sit: 4: Min assist;3: Mod assist Supine to Sit Details (indicate cue type and reason): cues for use of R LE and UEs  as well as for follow through on THP Transfers Transfers: Yes Sit to Stand: 4: Min assist;3: Mod assist;From bed;With upper extremity assist Sit to Stand Details (indicate cue type and reason): cues for use of UEs and for LE position Stand to Sit: 4: Min assist;3: Mod assist;With upper extremity assist;To chair/3-in-1;With armrests Stand to Sit Details: cues for use of UEs and for LE position Ambulation/Gait Ambulation/Gait: Yes Ambulation/Gait Assistance: 4: Min assist;3: Mod assist Ambulation/Gait Assistance Details (indicate cue type and reason): cues for ER on L, stride length,  posture and position from RW Ambulation Distance (Feet): 100 Feet Assistive device: Rolling walker Gait Pattern: Step-to pattern;Step-through pattern    Exercise  Total Joint Exercises Ankle Circles/Pumps: AROM;Both;15 reps;Supine Quad Sets: AROM;10 reps;Supine;Both Heel Slides: AAROM;15 reps;Supine;Left Hip ABduction/ADduction: AAROM;Left;15 reps;Supine End of Session PT - End of Session Equipment Utilized During Treatment: Gait belt Activity Tolerance: Patient tolerated treatment well Patient left: in chair;with call bell in reach;with family/visitor present Nurse Communication: Mobility status for transfers;Mobility status for ambulation General Behavior During Session: [facility] for tasks performed Cognition: [facility] for tasks performed  Emmanuel Gruenhagen 08/28/2011, 12:00 PM

## 2011-08-28 NOTE — Progress Notes (Signed)
Patient ID: Casey Tucker, male   DOB: Feb 18, 1941, 71 y.o.   MRN: [id number] Subjective: 1 Day Post-Op Procedure(s) (LRB): TOTAL HIP REVISION (Left)    Patient reports pain as mild.  Objective:   VITALS:   Filed Vitals:   08/28/11 0638  BP: 118/67  Pulse: 80  Temp: 98.2 F (36.8 C)  Resp: 14    Neurovascular intact Incision: dressing C/D/I  LABS  Basename 08/28/11 0350 08/27/11 1020  HGB 7.4* 9.7*  HCT 21.7* 29.0*  WBC 3.6* 4.4  PLT 435* 474*     Basename 08/28/11 0350 08/27/11 1020  NA 135 135  K 4.2 3.9  BUN 12 13  CREATININE 0.67 0.78  GLUCOSE 135* 110*     Basename 08/27/11 1020  LABPT --  INR 1.09     Assessment/Plan: 1 Day Post-Op Procedure(s) (LRB): TOTAL HIP REVISION (Left)   Up with therapy Plan for discharge tomorrow Discharge home with home health  D/C hemovac. Though low hemoglobin continue to monitor symptoms and volume resuscitate as tolerable

## 2011-08-28 NOTE — Anesthesia Postprocedure Evaluation (Signed)
  Anesthesia Post-op Note  Patient: Casey Tucker  Procedure(s) Performed:  TOTAL HIP REVISION  Patient Location: PACU  Anesthesia Type: General  Level of Consciousness: awake and alert   Airway and Oxygen Therapy: Patient Spontanous Breathing  Post-op Pain: mild  Post-op Assessment: Post-op Vital signs reviewed, Patient's Cardiovascular Status Stable, Respiratory Function Stable, Patent Airway and No signs of Nausea or vomiting  Post-op Vital Signs: stable  Complications: No apparent anesthesia complications

## 2011-08-28 NOTE — Progress Notes (Signed)
Physical Therapy Treatment Patient Details Name: Casey Tucker MRN: [id number] DOB: [birth year] Today's Date: 08/28/2011  PT Assessment/Plan  PT - Assessment/Plan PT Plan: Discharge plan remains appropriate PT Frequency: 7X/week Recommendations for Other Services: OT consult Follow Up Recommendations: Home health PT Equipment Recommended: None recommended by PT PT Goals  Acute Rehab PT Goals PT Goal Formulation: With patient Time For Goal Achievement: 7 days Pt will go Sit to Stand: with supervision PT Goal: Sit to Stand - Progress: Progressing toward goal Pt will go Stand to Sit: with supervision PT Goal: Stand to Sit - Progress: Progressing toward goal Pt will Ambulate: >150 feet;with supervision;with rolling walker PT Goal: Ambulate - Progress: Progressing toward goal  PT Treatment Precautions/Restrictions  Precautions Precautions: Posterior Hip Precaution Booklet Issued:  (sign hung in room) Restrictions Weight Bearing Restrictions: No Other Position/Activity Restrictions: WBAT Mobility (including Balance) Transfers Sit to Stand: 4: Min assist;With armrests;From chair/3-in-1;With upper extremity assist Sit to Stand Details (indicate cue type and reason): cues for use of UEs and for LE position Stand to Sit: 4: Min assist;With armrests;With upper extremity assist;To chair/3-in-1 Stand to Sit Details: cues for use of UEs and for LE position Ambulation/Gait Ambulation/Gait Assistance: 4: Min assist Ambulation/Gait Assistance Details (indicate cue type and reason): cues for sequence, position from RW and posture Ambulation Distance (Feet): 200 Feet Assistive device: Rolling walker Gait Pattern: Step-to pattern;Step-through pattern    Exercise    End of Session PT - End of Session Equipment Utilized During Treatment: Gait belt Activity Tolerance: Patient tolerated treatment well Patient left: in chair;with call bell in reach;with family/visitor present Nurse  Communication: Mobility status for transfers;Mobility status for ambulation General Behavior During Session: [facility] for tasks performed Cognition: [facility] for tasks performed  Casey Tucker 08/28/2011, 5:26 PM

## 2011-08-28 NOTE — Clinical Documentation Improvement (Signed)
Anemia Blood Loss Clarification  THIS DOCUMENT IS NOT A PERMANENT PART OF THE MEDICAL RECORD  RESPOND TO THE THIS QUERY, FOLLOW THE INSTRUCTIONS BELOW:  1. If needed, update documentation for the patient's encounter via the notes activity.  2. Access this query again and click edit on the In [employer].  3. After updating, or not, click F2 to complete all highlighted (required) fields concerning your review. Select "additional documentation in the medical record" OR "no additional documentation provided".  4. Click Sign note button.  5. The deficiency will fall out of your In Basket *Please let us know if you are not able to complete this workflow by phone or e-mail (listed below).        08/28/11  Dear Dr. Charlann Boxer Marton Redwood  In an effort to better capture your patient's severity of illness, reflect appropriate length of stay and utilization of resources, a review of the patient medical record has revealed the following indicators.    Based on your clinical judgment, please clarify and document in a progress note and/or discharge summary the clinical condition associated with the following supporting information:  In responding to this query please exercise your independent judgment.  The fact that a query is asked, does not imply that any particular answer is desired or expected.  According to lab pt's H/H 7.4/21.7 (post op).   Please clarify based on abnormal H/H whether or not abn H/H can be further specified as one of the diagnoses listed below and document in pn or d/c summary.   Possible Clinical Conditions?   " Expected Acute Blood Loss Anemia  " Acute Blood Loss Anemia  " Acute on chronic blood loss anemia  " Other Condition________________  " Cannot Clinically Determine  Risk Factors:  Failed left total hip arthroplasty  Supporting Information: BLOOD LOSS:  About 700 mL. Though low hemoglobin continue to monitor symptoms and volume resuscitate as tolerable     Signs and Symptoms Abn H/H  Diagnostics: Component HGB HCT  Latest Ref Rng 13.0 - 17.0 g/dL 16.1 - 09.6 %  0/10/5407 9.7 (L) 29.0 (L)  08/28/2011 7.4 (L) 21.7 (L)     Treatments: IV fluids:  0.9 %  sodium chloride infusion     Serial H&H monitoring Medications Component HGB HCT  Latest Ref Rng 13.0 - 17.0 g/dL 81.1 - 91.4 %  01/25/2955 9.7 (L) 29.0 (L)  08/28/2011 7.4 (L) 21.7 (L)    Reviewed:  no additional documentation provided 2/21/2013ljh  Thank You,  Enis Slipper  RN, BSN, CCDS Clinical Documentation Specialist [facility] Dept Pager: [phone] / E-mail: [email or web address]  [employer] [place]

## 2011-08-28 NOTE — Progress Notes (Signed)
OT Note:  Pt had visitor present and didn't feel he needed OT.  After leaving room, RN felt that review of precautions with wife would be beneficial.  Will plant to have OT who is working Saturday stop by.  [place], [place] [phone] 08/28/2011

## 2011-08-28 NOTE — Progress Notes (Signed)
Son called and was concerned for his mother due to her history of falling and with that fall she had a tore esophagus and has had blood in stool. 08/28/11

## 2011-08-28 NOTE — Progress Notes (Signed)
Nutrition Brief Note:  Pt has been on Azilect for Parkinson's disease for >7 years. Pt is aware of nutrition therapy recommended for this medication. Handout provided to pt per his request to 'refresh' on foods recommended/not recommended while taking medication.  Pt voices understanding of information provided.  Encouraged no drastic changes to diet as pt's regimen has been adjusted over time with no recent need for changes.  RD contact information provided to pt.  No additional nutrition needs at this time.  Pager: [phone]

## 2011-08-28 NOTE — Op Note (Signed)
NAMEEDIBERTO, SENS               ACCOUNT NO.:  [id number]  MEDICAL RECORD NO.:  [id number]  LOCATION:  1611                         FACILITY:  [facility]  PHYSICIAN:  Madlyn Frankel. Charlann Boxer, M.D.  DATE OF BIRTH:  February 12, 1941  DATE OF PROCEDURE: DATE OF DISCHARGE:                              OPERATIVE REPORT   PREOPERATIVE DIAGNOSIS:  Failed left total hip replacement, with a dissociated acetabular component.  POSTOPERATIVE DIAGNOSES: 1. Failed left total hip replacement with associated acetabular     component with dislocated hip. 2. Significantly large hematoma, left hip, with evacuation of about     500 mL.  No signs of infection.  PROCEDURE: 1. Revision left total hip replacement with a new acetabular cup, a     size 54 Gription cluster hole pinnacle shell, single cancellous     screw, and a 36+ 4 neutral Ultrex liner, and a 36+ 12 ASphere metal     ball.  SURGEON:  Madlyn Frankel. Charlann Boxer, M.D.  ASSISTANT:  Lanney Gins, PA. Please note, Mr. Carmon Sails was present for the entirety of the case for preoperative positioning, perioperative retractor management, leg management, general facilitation of the case as well as primary wound closure.  ANESTHESIA:  Spinal.  SPECIMEN:  None.  COMPLICATION:  None apparent.  DRAINS:  One Hemovac.  BLOOD LOSS:  About 700 mL.  INDICATIONS FOR PROCEDURE:  Mr. Wich is a 71 year old gentleman who underwent an index left total hip replacement approximately 2 weeks ago. He had been discharged to a nursing facility and noted unfortunately rather early a significant increase in pain.  He was seen and evaluated with radiographs, indicated not only a dislocated left femoral head, but also dissociated femoral or acetabular component.  He was transferred over for definitive measures.  I had a chance to review the risks of infection in the setting of repeat operations, the chance of dislocation in revision settings as well as standard risks and concerns  of polyethylene wear, longevity components, DVT.  Consent was obtained for the benefit of management of the dislocation, dissociation of acetabulum and pain relief.  PROCEDURE IN DETAIL:  The patient was brought to the operative theater. Once adequate anesthesia,  preoperative antibiotics, Ancef administered, the patient was positioned into the right lateral decubitus position, left side up.  Following a pre-prepping and draping, the left hip staples were removed.  We then cleaned the hip with a Betadine scrub. The left hip was then prepped and draped with a DuraPrep prep.  Time-out was performed identifying the patient, planned procedure, and extremity. I excised the margins of the skin around the old incision to be able to reapproximated it with more normal tissue.  Sharp dissection was carried down to the extensor mechanism to the level of the iliotibial band and gluteal fascia.  Here we encountered approximately 100 mL of hematoma. No signs of infection.  Visible suture lines from the previous surgery were identified and upon inserting into the subfascial layer there was the remaining 300 mL of hematoma present and it came out under pressure. There was no sign of infection.  The hematoma was completely evacuated as well as some blood clot that was inside the  hip joint area.  I then used pulse lavage at this point, about 300 mL to irrigate the joint out, and removed the clotted blood.  Sharp dissection was carried down to the capsular plane and the capsule opened.  Following exposure, I was able to remove the acetabular shell without difficulty or complication.  The femoral head was then removed. Once I established some mobility of the femur at this point, as well as for placement of the trunnion for retraction of the femur, retractors were placed for exposure of the acetabulum.  With the acetabulum exposed I reamed initially with a 46 reamer, then medialized the hip socket  and then reamed up to 53 reamer with excellent bony bed preparation.  It was also evident there was a significant amount of osteophytes, anterior and posterior.  The final 54 Gription cup was chosen based on his problems that he had here with the hopes that we can avoid this again.  This cup was impacted and was positioned anatomically beneath the anterior rim  following removal of osteophytes at about 20 degrees of forward flexion, and 35-40 degrees of abduction.  A single cancellous screw was placed with excellent purchase.  At this point, based on the fact that the femoral component had been performed by another surgeon I went ahead and placed a trial 36+ 4 neutral trial liner.  With the trial liner in place we initially tried with a 36+ 5 ball, which was previously placed.  However, during the trial reduction we found out that the combined anteversion was very good at 45-50 degrees, but there was a little bit of subluxation with the flexion internal rotation, which I expected as it medialized and diminished or reduced the offset that was present from his previous surgery, based on the minimal reaming for completeness at the time of the surgery.  I did go ahead and trial a 36+ 12, I felt the length would be fine, again with medialization and evaluating leg lengths.  With a 36+ 12 ball, the hip range of motion was excellent without evidence of impingement.  His leg lengths appeared to be very close to being normal on this left side compared to the right.  Given these findings, the trial component was removed.  The final 36+ 4 neutral Ultrex liner was then impacted with good visualized rim fit.  The final 36+ 12 ASphere ball was then impacted onto a clean and dry trunnion.  The hip was reduced.  We did irrigate the hip throughout the case and ended up using about 2500 mL of pulse lavage, normal saline solution.  At this point, I reapproximated the remaining pseudocapsule that was  present and placed a medium Hemovac drain deep.  The iliotibial band and gluteal fascia were then reapproximated using #1 Vicryl.  The remainder of the wound was closed with 2-0 Vicryl, and running 4-0 Monocryl.  The hip was cleaned, dried and dressed sterilely using Dermabond and Aquacel dressing.  He was then brought to the recovery room in stable condition tolerating the procedure well.     Madlyn Frankel Charlann Boxer, M.D.     MDO/MEDQ  D:  08/27/2011  T:  08/28/2011  Job:  [id number]

## 2011-08-29 LAB — CBC
Hemoglobin: 7.4 g/dL — ABNORMAL LOW (ref 13.0–17.0)
MCH: 33.6 pg (ref 26.0–34.0)
Platelets: 447 10*3/uL — ABNORMAL HIGH (ref 150–400)
RBC: 2.2 MIL/uL — ABNORMAL LOW (ref 4.22–5.81)
RDW: 13.5 % (ref 11.5–15.5)
WBC: 5.7 10*3/uL (ref 4.0–10.5)

## 2011-08-29 LAB — BASIC METABOLIC PANEL
BUN: 13 mg/dL (ref 6–23)
Calcium: 8.4 mg/dL (ref 8.4–10.5)
Creatinine, Ser: 0.63 mg/dL (ref 0.50–1.35)
GFR calc Af Amer: >90 mL/min (ref >90)
Glucose, Bld: 126 mg/dL — ABNORMAL HIGH (ref 70–99)
Potassium: 4.2 mEq/L (ref 3.5–5.1)

## 2011-08-29 NOTE — Progress Notes (Signed)
Pt. Stable and ready for DC. Instructions given. IV saline lock DC'd. Transported by wheelchair to private vehicle and driven home by wife.

## 2011-08-29 NOTE — Progress Notes (Signed)
CARE MANAGEMENT NOTE 08/29/2011  Patient:  Casey Tucker, Casey Tucker   Account Number:  [id number]  Date Initiated:  08/28/2011  Documentation initiated by:  Colleen Can  Subjective/Objective Assessment:   dx failed left total hip; revision left total hip     Action/Plan:   CM spoke with patient. Plans are for patient to return to his home in [place] [facility] [place] where spouse will be caregiver. Patient states he has all of DME including RW, crtuches, and 3N1   Anticipated DC Date:  08/30/2011   Anticipated DC Plan:  HOME W HOME HEALTH SERVICES  In-house referral  NA      DC Planning Services  CM consult      [facility] Choice  HOME HEALTH   Choice offered to / List presented to:  C-1 Patient   DME arranged  NA      DME agency  NA     HH arranged  HH-2 PT      [facility] agency  [facility]   Status of service:  In process, will continue to follow Medicare Important Message given?   (If response is "NO", the following Medicare IM given date fields will be blank) Date Medicare IM given:   Date Additional Medicare IM given:    Discharge Disposition:    Per UR Regulation:    Comments:  08/29/11 1119 Casey Totino,Rn,BSN [phone] Cm spoke with pt concerning d/c planning, spouse present at bedisde.Per pt choice [name] to provide [facility] services. No DME needed. Spouse to assist in home care. [facility] notified of pt discharge on 08/29/11.    08/28/2011 Casey Tucker BSN CCM [phone] List of agencies given to patient for [facility] for patient to review. States he wants to get recommendations before he makes  choice. Wil advise CM for weekend to follow up for patient choice of agencies. List of agencies placed on shadow chart.

## 2011-08-29 NOTE — Progress Notes (Signed)
Physical Therapy Treatment Patient Details Name: Casey [facility] MRN: [id number] DOB: June 09, 1941 Today's Date: 08/29/2011  PT Assessment/Plan  PT - Assessment/Plan Comments on Treatment Session: Reviewed car transfers with pt and spouse PT Plan: Discharge plan remains appropriate PT Frequency: 7X/week Recommendations for Other Services: OT consult Follow Up Recommendations: Home health PT Equipment Recommended: None recommended by PT PT Goals  Acute Rehab PT Goals PT Goal Formulation: With patient Time For Goal Achievement: 7 days Pt will go Supine/Side to Sit: with supervision PT Goal: Supine/Side to Sit - Progress: Met Pt will go Sit to Supine/Side: with supervision PT Goal: Sit to Supine/Side - Progress: Met Pt will go Sit to Stand: with supervision PT Goal: Sit to Stand - Progress: Met Pt will go Stand to Sit: with supervision PT Goal: Stand to Sit - Progress: Met Pt will Ambulate: >150 feet;with supervision;with rolling walker PT Goal: Ambulate - Progress: Met Pt will Go Up / Down Stairs: 1-2 stairs;with min assist;with least restrictive assistive device PT Goal: Up/Down Stairs - Progress: Met  PT Treatment Precautions/Restrictions  Precautions Precautions: Posterior Hip Precaution Booklet Issued:  (sign hung in room) Restrictions Weight Bearing Restrictions: No Other Position/Activity Restrictions: WBAT Mobility (including Balance) Bed Mobility Supine to Sit: 5: Supervision Supine to Sit Details (indicate cue type and reason): cues for technique/sequence and THP Sit to Supine: 5: Supervision Sit to Supine - Details (indicate cue type and reason): cues for technique/sequence and THP Transfers Sit to Stand: 5: Supervision Sit to Stand Details (indicate cue type and reason): cues for use of UEs Stand to Sit: 5: Supervision Stand to Sit Details: cues for LE position Ambulation/Gait Ambulation/Gait Assistance: 5: Supervision Ambulation/Gait Assistance Details  (indicate cue type and reason): cues for turn Ambulation Distance (Feet): 40 Feet Assistive device: Rolling walker Gait Pattern: Step-to pattern;Step-through pattern Stairs: Yes Stairs Assistance: 4: Min assist Stairs Assistance Details (indicate cue type and reason): cues for sequence and foot/crutch placement Stair Management Technique: One rail Right;Forwards;Step to pattern;With crutches Number of Stairs: 4      End of Session PT - End of Session Equipment Utilized During Treatment: Gait belt Activity Tolerance: Patient tolerated treatment well Patient left: in chair;with call bell in reach;with family/visitor present Nurse Communication: Mobility status for transfers;Mobility status for ambulation General Behavior During Session: [facility] for tasks performed Cognition: [facility] for tasks performed  Bretton Tandy 08/29/2011, 12:28 PM

## 2011-08-29 NOTE — Progress Notes (Signed)
Physical Therapy Treatment Patient Details Name: Casey Tucker MRN: [id number] DOB: [birth year] Today's Date: 08/29/2011  PT Assessment/Plan  PT - Assessment/Plan Comments on Treatment Session: Pt very motivated and eager for d/c home PT Plan: Discharge plan remains appropriate PT Frequency: 7X/week Recommendations for Other Services: OT consult Follow Up Recommendations: Home health PT Equipment Recommended: None recommended by PT PT Goals  Acute Rehab PT Goals PT Goal Formulation: With patient Time For Goal Achievement: 7 days Pt will go Supine/Side to Sit: with supervision PT Goal: Supine/Side to Sit - Progress: Met Pt will go Sit to Supine/Side: with supervision PT Goal: Sit to Supine/Side - Progress: Met Pt will go Sit to Stand: with supervision PT Goal: Sit to Stand - Progress: Met Pt will go Stand to Sit: with supervision PT Goal: Stand to Sit - Progress: Met Pt will Ambulate: >150 feet;with supervision;with rolling walker PT Goal: Ambulate - Progress: Met  PT Treatment Precautions/Restrictions  Precautions Precautions: Posterior Hip Precaution Booklet Issued:  (sign hung in room) Restrictions Weight Bearing Restrictions: No Other Position/Activity Restrictions: WBAT Mobility (including Balance) Bed Mobility Supine to Sit: 5: Supervision Supine to Sit Details (indicate cue type and reason): cues for technique/sequence and THP Sit to Supine: 5: Supervision Sit to Supine - Details (indicate cue type and reason): cues for technique/sequence and THP Transfers Sit to Stand: 5: Supervision Sit to Stand Details (indicate cue type and reason): cues for use of UEs Stand to Sit: 5: Supervision Stand to Sit Details: cues for LE position Ambulation/Gait Ambulation/Gait Assistance: 5: Supervision Ambulation/Gait Assistance Details (indicate cue type and reason): cues for position from RW Ambulation Distance (Feet): 250 Feet Assistive device: Rolling walker Gait Pattern:  Step-to pattern;Step-through pattern    Exercise  Total Joint Exercises Ankle Circles/Pumps: 20 reps;Both;AROM Quad Sets: AROM;20 reps;Both;Supine Gluteal Sets: 10 reps;5 reps;Both;AROM;Supine Heel Slides: AAROM;20 reps;Supine;Left Hip ABduction/ADduction: AAROM;20 reps;Supine;Left End of Session PT - End of Session Activity Tolerance: Patient tolerated treatment well Patient left: in bed;with call bell in reach;with family/visitor present Nurse Communication: Mobility status for transfers;Mobility status for ambulation General Behavior During Session: [facility] for tasks performed Cognition: [facility] for tasks performed  Rhea Kaelin 08/29/2011, 12:20 PM

## 2011-08-29 NOTE — Progress Notes (Signed)
Patient ID: Casey Tucker, male   DOB: [birth year], 71 y.o.   MRN: [id number] Subjective: 2 Days Post-Op Procedure(s) (LRB): TOTAL HIP REVISION (Left)    Patient reports pain as mild.  States that he is doing very well. Better than expected. Ready to go home  Objective:   VITALS:   Filed Vitals:   08/29/11 0636  BP: 125/71  Pulse: 89  Temp: 97.5 F (36.4 C)  Resp: 18    Neurovascular intact Incision: dressing C/D/I Some thigh swelling but otherwse no signs of infection  LABS  Basename 08/29/11 0427 08/28/11 0350 08/27/11 1020  HGB 7.4* 7.4* 9.7*  HCT 22.5* 21.7* 29.0*  WBC 5.7 3.6* 4.4  PLT 447* 435* 474*     Basename 08/29/11 0427 08/28/11 0350 08/27/11 1020  NA 135 135 135  K 4.2 4.2 3.9  BUN 13 12 13   CREATININE 0.63 0.67 0.78  GLUCOSE 126* 135* 110*     Basename 08/27/11 1020  LABPT --  INR 1.09     Assessment/Plan: 2 Days Post-Op Procedure(s) (LRB): TOTAL HIP REVISION (Left)   Discharge home with home health Ready to go home RTC in 2 weeks

## 2011-08-31 DIAGNOSIS — D649 Anemia, unspecified: Secondary | ICD-10-CM | POA: Diagnosis not present

## 2011-08-31 DIAGNOSIS — Z4789 Encounter for other orthopedic aftercare: Secondary | ICD-10-CM | POA: Diagnosis not present

## 2011-08-31 DIAGNOSIS — G20 Parkinson's disease: Secondary | ICD-10-CM | POA: Diagnosis not present

## 2011-08-31 DIAGNOSIS — T84069A Wear of articular bearing surface of unspecified internal prosthetic joint, initial encounter: Secondary | ICD-10-CM | POA: Diagnosis not present

## 2011-08-31 DIAGNOSIS — Z96649 Presence of unspecified artificial hip joint: Secondary | ICD-10-CM | POA: Diagnosis not present

## 2011-08-31 DIAGNOSIS — Z4801 Encounter for change or removal of surgical wound dressing: Secondary | ICD-10-CM | POA: Diagnosis not present

## 2011-09-01 NOTE — Discharge Summary (Signed)
Physician Discharge Summary  Patient ID: Casey Tucker MRN: [id number] DOB/AGE: [birth year] 71 y.o.  Admit date: 08/27/2011 Discharge date: 08/29/2011  Procedures:  Procedure(s) (LRB): TOTAL HIP REVISION (Left)  Attending Physician: Casey [facility]  Admission Diagnoses: Failed left total hip arthroplasty    Discharge Diagnoses:  Principal Problem:  *S/P revision of left total hip GERD (gastroesophageal reflux disease)  History of kidney stones   Parkinson disease  Arthritis  HPI: Pt is a 71 y.o. male complaining of left hip pain for 1 week. Pt has a history of a left total hip arthroplasty. Pt states that he was ok for about a week, but then during an episode of PT he felt a pop and had instant and continuous severe pain since. X-rays in the clinic show left hip dislocation and disassociation of the acetabular component from the bone. The pt was originally had a diagnosis of AVN of the left hip. He had a left total hip arthroplasty on 08/14/2011. Various options are discussed with the patient. Risks, benefits and expectations were discussed with the patient. Patient understand the risks, benefits and expectations and wishes to proceed with surgery.  PCP: Casey Abide, MD, MD   Discharged Condition: good  T[facility] Course:  Patient underwent the above stated procedure on 08/27/2011. Patient tolerated the procedure well and brought to the recovery room in good condition and subsequently to the floor.  POD #1 BP: 118/67 ; Pulse: 80 ; Temp: 98.2 F (36.8 C) ; Resp: 14 Pt's foley was removed, as well as the hemovac drain removed. IV was changed to a saline lock. Patient reports pain as mild. Neurovascular intact and incision: dressing C/Tucker/I  LABS  Basename  08/28/11 0350   HGB  7.4*  HCT  21.7*    POD #2  BP: 125/71 ; Pulse: 89 ; Temp: 97.5 F (36.4 C) ; Resp: 18 Patient reports pain as mild. States that he is doing very well. Better than expected. Ready to go home Neurovascular  intact, incision: dressing C/Tucker/I and some thigh swelling but otherwse no signs of infection.  LABS  Basename  08/29/11 0427   HGB  7.4*  HCT  22.5*     Discharge Exam: Extremities: Homans sign is negative, no sign of DVT, no edema, redness or tenderness in the calves or thighs and no ulcers, gangrene or trophic changes  Disposition: Home or Self Care with follow up in 2 weeks  Follow-up Information    Follow up with [facility] in 2 weeks.   Contact information:   Casey [facility] [address], [address] [place] [address] [phone]          Discharge Orders    Future Appointments: Provider: Department: Dept Phone: Center:   02/15/2012 3:30 PM Casey Baas, MD [facility] [phone] [id number]   03/07/2012 8:25 AM [facility] [place] [phone] [facility]   03/16/2012 9:30 AM Casey Baas, MD [facility] [phone] [facility]     Future Orders Please Complete By Expires   Call MD / Call 911      Comments:   If you experience chest pain or shortness of breath, CALL 911 and be transported to the [facility] emergency room.  If you develope a fever above 101 F, pus (white drainage) or increased drainage or redness at the wound, or calf pain, call your surgeon's office.   Discharge instructions      Comments:   Maintain surgical dressing for 8 days, then replace with  gauze and tape. Keep the area dry and clean until follow up. Follow up in 2 weeks at [facility]. Call with any questions or concerns.     Constipation Prevention      Comments:   Drink plenty of fluids.  Prune juice may be helpful.  You may use a stool softener, such as Colace (over the counter) 100 mg twice a day.  Use MiraLax (over the counter) for constipation as needed.   Increase activity slowly as tolerated      Weight Bearing as taught in Physical Therapy      Comments:   Use a walker or crutches as instructed.   Driving  restrictions      Comments:   No driving for 4 weeks   Change dressing      Comments:   Maintain surgical dressing for 8 days, then replace with 4x4 guaze and tape. Keep the area dry and clean.   TED hose      Comments:   Use stockings (TED hose) for 2 weeks on both leg(s).  You may remove them at night for sleeping.      Discharge Medication List as of 08/29/2011 11:23 AM    START taking these medications   Details  aspirin EC 325 MG tablet Take 1 tablet (325 mg total) by mouth 2 (two) times daily. X 4 weeks, Starting 08/28/2011, Until Mon 09/07/11, No Print    diphenhydrAMINE (BENADRYL) 25 mg capsule Take 1 capsule (25 mg total) by mouth every 6 (six) hours as needed for itching, allergies or sleep., Starting 08/28/2011, Until Mon 09/07/11, No Print    docusate sodium 100 MG CAPS Take 100 mg by mouth 2 (two) times daily., Starting 08/28/2011, Until Mon 09/07/11, No Print    HYDROcodone-acetaminophen (NORCO) 7.5-325 MG per tablet Take 1-2 tablets by mouth every 4 (four) hours., Starting 08/28/2011, Until Mon 09/07/11, Print      CONTINUE these medications which have NOT CHANGED   Details  ALPRAZolam (XANAX) 0.5 MG tablet Take 0.5 mg by mouth 2 (two) times daily., Until Discontinued, Historical Med    carbidopa-levodopa (SINEMET) 25-100 MG per tablet 1 tablet 3 (three) times daily. Prior to meals  1/2 hour, Until Discontinued, Historical Med    cyclobenzaprine (FLEXERIL) 10 MG tablet Take 10 mg by mouth at bedtime., Until Discontinued, Historical Med    ferrous gluconate (FERGON) 246 (28 FE) MG tablet Take 246 mg by mouth 3 (three) times daily with meals., Until Discontinued, Historical Med    magnesium hydroxide (MILK OF MAGNESIA) 400 MG/5ML suspension Take 30 mLs by mouth daily as needed. laxative, Until Discontinued, Historical Med    polyethylene glycol (MIRALAX / GLYCOLAX) packet Take 17 g by mouth daily., Until Discontinued, Historical Med    !! pramipexole (MIRAPEX) 0.5 MG tablet Take  0.5 mg by mouth 3 (three) times daily., Until Discontinued, Historical Med    !! pramipexole (MIRAPEX) 1 MG tablet Take 1 mg by mouth at bedtime., Until Discontinued, Historical Med    Rasagiline Mesylate (AZILECT) 1 MG TABS Take 1 mg by mouth daily. , Until Discontinued, Historical Med    senna (SENOKOT) 8.6 MG tablet Take 1 tablet by mouth 2 (two) times daily., Until Discontinued, Historical Med     !! - Potential duplicate medications found. Please discuss with provider.    STOP taking these medications     acetaminophen (TYLENOL) 325 MG tablet Comments:  Reason for Stopping:       oxycodone (OXY-IR) 5  MG capsule Comments:  Reason for Stopping:          Signed: Anastasio Auerbach. Khara Renaud   PAC  09/01/2011, 2:33 PM

## 2011-09-02 DIAGNOSIS — Z4801 Encounter for change or removal of surgical wound dressing: Secondary | ICD-10-CM | POA: Diagnosis not present

## 2011-09-02 DIAGNOSIS — D649 Anemia, unspecified: Secondary | ICD-10-CM | POA: Diagnosis not present

## 2011-09-02 DIAGNOSIS — Z96649 Presence of unspecified artificial hip joint: Secondary | ICD-10-CM | POA: Diagnosis not present

## 2011-09-02 DIAGNOSIS — G20 Parkinson's disease: Secondary | ICD-10-CM | POA: Diagnosis not present

## 2011-09-02 DIAGNOSIS — Z4789 Encounter for other orthopedic aftercare: Secondary | ICD-10-CM | POA: Diagnosis not present

## 2011-09-04 DIAGNOSIS — Z4789 Encounter for other orthopedic aftercare: Secondary | ICD-10-CM | POA: Diagnosis not present

## 2011-09-04 DIAGNOSIS — Z4801 Encounter for change or removal of surgical wound dressing: Secondary | ICD-10-CM | POA: Diagnosis not present

## 2011-09-04 DIAGNOSIS — Z96649 Presence of unspecified artificial hip joint: Secondary | ICD-10-CM | POA: Diagnosis not present

## 2011-09-04 DIAGNOSIS — G20 Parkinson's disease: Secondary | ICD-10-CM | POA: Diagnosis not present

## 2011-09-04 DIAGNOSIS — D649 Anemia, unspecified: Secondary | ICD-10-CM | POA: Diagnosis not present

## 2011-09-07 DIAGNOSIS — Z96649 Presence of unspecified artificial hip joint: Secondary | ICD-10-CM | POA: Diagnosis not present

## 2011-09-07 DIAGNOSIS — D649 Anemia, unspecified: Secondary | ICD-10-CM | POA: Diagnosis not present

## 2011-09-07 DIAGNOSIS — Z4801 Encounter for change or removal of surgical wound dressing: Secondary | ICD-10-CM | POA: Diagnosis not present

## 2011-09-07 DIAGNOSIS — Z4789 Encounter for other orthopedic aftercare: Secondary | ICD-10-CM | POA: Diagnosis not present

## 2011-09-07 DIAGNOSIS — G20 Parkinson's disease: Secondary | ICD-10-CM | POA: Diagnosis not present

## 2011-09-09 DIAGNOSIS — D649 Anemia, unspecified: Secondary | ICD-10-CM | POA: Diagnosis not present

## 2011-09-09 DIAGNOSIS — Z4789 Encounter for other orthopedic aftercare: Secondary | ICD-10-CM | POA: Diagnosis not present

## 2011-09-09 DIAGNOSIS — G20 Parkinson's disease: Secondary | ICD-10-CM | POA: Diagnosis not present

## 2011-09-09 DIAGNOSIS — Z96649 Presence of unspecified artificial hip joint: Secondary | ICD-10-CM | POA: Diagnosis not present

## 2011-09-09 DIAGNOSIS — Z4801 Encounter for change or removal of surgical wound dressing: Secondary | ICD-10-CM | POA: Diagnosis not present

## 2011-09-10 DIAGNOSIS — Z4789 Encounter for other orthopedic aftercare: Secondary | ICD-10-CM | POA: Diagnosis not present

## 2011-09-10 DIAGNOSIS — Z4801 Encounter for change or removal of surgical wound dressing: Secondary | ICD-10-CM | POA: Diagnosis not present

## 2011-09-10 DIAGNOSIS — D649 Anemia, unspecified: Secondary | ICD-10-CM | POA: Diagnosis not present

## 2011-09-10 DIAGNOSIS — Z96649 Presence of unspecified artificial hip joint: Secondary | ICD-10-CM | POA: Diagnosis not present

## 2011-09-10 DIAGNOSIS — G20 Parkinson's disease: Secondary | ICD-10-CM | POA: Diagnosis not present

## 2011-09-15 DIAGNOSIS — Z4801 Encounter for change or removal of surgical wound dressing: Secondary | ICD-10-CM | POA: Diagnosis not present

## 2011-09-15 DIAGNOSIS — G20 Parkinson's disease: Secondary | ICD-10-CM | POA: Diagnosis not present

## 2011-09-15 DIAGNOSIS — Z96649 Presence of unspecified artificial hip joint: Secondary | ICD-10-CM | POA: Diagnosis not present

## 2011-09-15 DIAGNOSIS — D649 Anemia, unspecified: Secondary | ICD-10-CM | POA: Diagnosis not present

## 2011-09-15 DIAGNOSIS — Z4789 Encounter for other orthopedic aftercare: Secondary | ICD-10-CM | POA: Diagnosis not present

## 2011-09-17 DIAGNOSIS — D649 Anemia, unspecified: Secondary | ICD-10-CM | POA: Diagnosis not present

## 2011-09-17 DIAGNOSIS — G20 Parkinson's disease: Secondary | ICD-10-CM | POA: Diagnosis not present

## 2011-09-17 DIAGNOSIS — Z4801 Encounter for change or removal of surgical wound dressing: Secondary | ICD-10-CM | POA: Diagnosis not present

## 2011-09-17 DIAGNOSIS — Z96649 Presence of unspecified artificial hip joint: Secondary | ICD-10-CM | POA: Diagnosis not present

## 2011-09-17 DIAGNOSIS — Z4789 Encounter for other orthopedic aftercare: Secondary | ICD-10-CM | POA: Diagnosis not present

## 2011-09-21 ENCOUNTER — Encounter (HOSPITAL_COMMUNITY): Payer: Self-pay | Admitting: Orthopedic Surgery

## 2011-09-21 DIAGNOSIS — M1610 Unilateral primary osteoarthritis, unspecified hip: Secondary | ICD-10-CM | POA: Diagnosis not present

## 2011-09-22 DIAGNOSIS — G20 Parkinson's disease: Secondary | ICD-10-CM | POA: Diagnosis not present

## 2011-09-22 DIAGNOSIS — Z96649 Presence of unspecified artificial hip joint: Secondary | ICD-10-CM | POA: Diagnosis not present

## 2011-09-23 DIAGNOSIS — M1610 Unilateral primary osteoarthritis, unspecified hip: Secondary | ICD-10-CM | POA: Diagnosis not present

## 2011-09-25 DIAGNOSIS — M1610 Unilateral primary osteoarthritis, unspecified hip: Secondary | ICD-10-CM | POA: Diagnosis not present

## 2011-09-28 DIAGNOSIS — M1610 Unilateral primary osteoarthritis, unspecified hip: Secondary | ICD-10-CM | POA: Diagnosis not present

## 2011-10-01 DIAGNOSIS — M1610 Unilateral primary osteoarthritis, unspecified hip: Secondary | ICD-10-CM | POA: Diagnosis not present

## 2011-10-02 DIAGNOSIS — M1610 Unilateral primary osteoarthritis, unspecified hip: Secondary | ICD-10-CM | POA: Diagnosis not present

## 2011-10-05 DIAGNOSIS — M1610 Unilateral primary osteoarthritis, unspecified hip: Secondary | ICD-10-CM | POA: Diagnosis not present

## 2011-10-06 DIAGNOSIS — M1610 Unilateral primary osteoarthritis, unspecified hip: Secondary | ICD-10-CM | POA: Diagnosis not present

## 2011-10-09 DIAGNOSIS — T84069A Wear of articular bearing surface of unspecified internal prosthetic joint, initial encounter: Secondary | ICD-10-CM | POA: Diagnosis not present

## 2011-10-29 DIAGNOSIS — H2510 Age-related nuclear cataract, unspecified eye: Secondary | ICD-10-CM | POA: Diagnosis not present

## 2011-10-29 DIAGNOSIS — H18419 Arcus senilis, unspecified eye: Secondary | ICD-10-CM | POA: Diagnosis not present

## 2011-10-29 DIAGNOSIS — H11159 Pinguecula, unspecified eye: Secondary | ICD-10-CM | POA: Diagnosis not present

## 2011-10-29 DIAGNOSIS — H4420 Degenerative myopia, unspecified eye: Secondary | ICD-10-CM | POA: Diagnosis not present

## 2011-11-23 DIAGNOSIS — H903 Sensorineural hearing loss, bilateral: Secondary | ICD-10-CM | POA: Diagnosis not present

## 2011-11-23 DIAGNOSIS — G20 Parkinson's disease: Secondary | ICD-10-CM | POA: Diagnosis not present

## 2011-11-23 DIAGNOSIS — H6120 Impacted cerumen, unspecified ear: Secondary | ICD-10-CM | POA: Diagnosis not present

## 2011-11-25 DIAGNOSIS — T84069A Wear of articular bearing surface of unspecified internal prosthetic joint, initial encounter: Secondary | ICD-10-CM | POA: Diagnosis not present

## 2011-12-01 DIAGNOSIS — L989 Disorder of the skin and subcutaneous tissue, unspecified: Secondary | ICD-10-CM | POA: Diagnosis not present

## 2012-01-26 DIAGNOSIS — Z85828 Personal history of other malignant neoplasm of skin: Secondary | ICD-10-CM | POA: Diagnosis not present

## 2012-01-26 DIAGNOSIS — D485 Neoplasm of uncertain behavior of skin: Secondary | ICD-10-CM | POA: Diagnosis not present

## 2012-01-26 DIAGNOSIS — L570 Actinic keratosis: Secondary | ICD-10-CM | POA: Diagnosis not present

## 2012-01-26 DIAGNOSIS — G20 Parkinson's disease: Secondary | ICD-10-CM | POA: Diagnosis not present

## 2012-01-26 DIAGNOSIS — D0439 Carcinoma in situ of skin of other parts of face: Secondary | ICD-10-CM | POA: Diagnosis not present

## 2012-02-15 ENCOUNTER — Ambulatory Visit: Payer: Medicare Other | Admitting: Internal Medicine

## 2012-03-01 DIAGNOSIS — C44320 Squamous cell carcinoma of skin of unspecified parts of face: Secondary | ICD-10-CM | POA: Diagnosis not present

## 2012-03-01 DIAGNOSIS — Z481 Encounter for planned postprocedural wound closure: Secondary | ICD-10-CM | POA: Diagnosis not present

## 2012-03-07 ENCOUNTER — Other Ambulatory Visit: Payer: Medicare Other

## 2012-03-15 ENCOUNTER — Other Ambulatory Visit: Payer: Medicare Other

## 2012-03-16 ENCOUNTER — Encounter: Payer: Self-pay | Admitting: Internal Medicine

## 2012-03-16 ENCOUNTER — Ambulatory Visit (INDEPENDENT_AMBULATORY_CARE_PROVIDER_SITE_OTHER): Payer: Medicare Other | Admitting: Internal Medicine

## 2012-03-16 VITALS — BP 128/70 | HR 75 | Temp 97.7°F | Ht 71.0 in | Wt 185.0 lb

## 2012-03-16 DIAGNOSIS — E7800 Pure hypercholesterolemia, unspecified: Secondary | ICD-10-CM

## 2012-03-16 DIAGNOSIS — IMO0001 Reserved for inherently not codable concepts without codable children: Secondary | ICD-10-CM

## 2012-03-16 DIAGNOSIS — I700 Atherosclerosis of aorta: Secondary | ICD-10-CM

## 2012-03-16 DIAGNOSIS — Z0000 Encounter for general adult medical examination without abnormal findings: Secondary | ICD-10-CM | POA: Diagnosis not present

## 2012-03-16 DIAGNOSIS — G20 Parkinson's disease: Secondary | ICD-10-CM

## 2012-03-16 LAB — CBC WITH DIFFERENTIAL/PLATELET
Eosinophils Relative: 5.5 % — ABNORMAL HIGH (ref 0.0–5.0)
HCT: 42.4 % (ref 39.0–52.0)
Hemoglobin: 14.3 g/dL (ref 13.0–17.0)
Lymphocytes Relative: 32.7 % (ref 12.0–46.0)
Lymphs Abs: 1 10*3/uL (ref 0.7–4.0)
MCV: 106.6 fl — ABNORMAL HIGH (ref 78.0–100.0)
Monocytes Absolute: 0.4 10*3/uL (ref 0.1–1.0)
Monocytes Relative: 13.2 % — ABNORMAL HIGH (ref 3.0–12.0)
Neutro Abs: 1.5 10*3/uL (ref 1.4–7.7)
Platelets: 195 10*3/uL (ref 150.0–400.0)
WBC: 3.1 10*3/uL — ABNORMAL LOW (ref 4.5–10.5)

## 2012-03-16 LAB — BASIC METABOLIC PANEL
BUN: 14 mg/dL (ref 6–23)
Calcium: 8.6 mg/dL (ref 8.4–10.5)
Chloride: 102 mEq/L (ref 96–112)
Creatinine, Ser: 0.9 mg/dL (ref 0.4–1.5)
GFR: 92.09 mL/min (ref >60.00)
Glucose, Bld: 89 mg/dL (ref 70–99)
Potassium: 4 mEq/L (ref 3.5–5.1)
Sodium: 137 mEq/L (ref 135–145)

## 2012-03-16 LAB — HEPATIC FUNCTION PANEL
ALT: 4 U/L (ref 0–53)
AST: 16 U/L (ref 0–37)
Albumin: 3.6 g/dL (ref 3.5–5.2)
Alkaline Phosphatase: 67 U/L (ref 39–117)
Total Bilirubin: 1.6 mg/dL — ABNORMAL HIGH (ref 0.3–1.2)
Total Protein: 6.4 g/dL (ref 6.0–8.3)

## 2012-03-16 LAB — LIPID PANEL
Cholesterol: 166 mg/dL (ref 0–200)
LDL Cholesterol: 98 mg/dL (ref 0–99)
Total CHOL/HDL Ratio: 3
Triglycerides: 30 mg/dL (ref 0.0–149.0)
VLDL: 6 mg/dL (ref 0.0–40.0)

## 2012-03-16 LAB — TSH: TSH: 2.82 u[IU]/mL (ref 0.35–5.50)

## 2012-03-16 MED ORDER — CLONAZEPAM 0.5 MG PO TABS: 1.0000 mg | ORAL_TABLET | Freq: Every evening | ORAL | Status: DC | PRN

## 2012-03-16 NOTE — Assessment & Plan Note (Signed)
Atypical Well controlled and continues to be fully functional Dr Sandria Manly sees him

## 2012-03-16 NOTE — Assessment & Plan Note (Signed)
Mild Will recheck but no Rx other than the fish oil

## 2012-03-16 NOTE — Assessment & Plan Note (Signed)
I have personally reviewed the Medicare Annual Wellness questionnaire and have noted 1. The patient's medical and social history 2. Their use of alcohol, tobacco or illicit drugs 3. Their current medications and supplements 4. The patient's functional ability including ADL's, fall risks, home safety risks and hearing or visual             impairment. 5. Diet and physical activities 6. Evidence for depression or mood disorders  The patients weight, height, BMI and visual acuity have been recorded in the chart I have made referrals, counseling and provided education to the patient based review of the above and I have provided the pt with a written personalized care plan for preventive services.  I have provided you with a copy of your personalized plan for preventive services. Please take the time to review along with your updated medication list.  No PSA after discussion  Will do stool immunoassay

## 2012-03-16 NOTE — Assessment & Plan Note (Signed)
Soft murmur No symptoms Will follow clinically

## 2012-03-16 NOTE — Progress Notes (Signed)
Subjective:    Patient ID: Casey [facility], male    DOB: July 07, 1941, 71 y.o.   MRN: [id number]  HPI Here for Medicare Wellness visit and regular follow up No depression or anhedonia No falls and has rehabbed well Back to total independence with ADLs and instrumental ADLs Reviewed other doctors---sees Dr Sandria Manly No cognitive concerns Does regular exercise Non smoker, 1 drink daily in general Vision and hearing are fine Reviewed advanced directives Will do stool immunoassay---doesn't want colonoscopy    Parkinsons is stable Still sees  Dr Love---considered atypical Does have brief "off" periods where he notes the meds wearing off This is especially notable if he doesn't sleep great or if he overdoes it  Discussed high cholesterol He does take fish oil for this Doesn't want to take statins for primary prevention  No chest pain No SOB No dizziness upon arising No syncope  Uses clonazepam to help sleep Just prn  Current Outpatient Prescriptions on File Prior to Visit  Medication Sig Dispense Refill  . carbidopa-levodopa (SINEMET) 25-100 MG per tablet 1 tablet 3 (three) times daily. Prior to meals  1/2 hour      . clonazePAM (KLONOPIN) 0.5 MG tablet Take 1 mg by mouth at bedtime as needed.       . cyclobenzaprine (FLEXERIL) 10 MG tablet Take 20 mg by mouth at bedtime.       . pramipexole (MIRAPEX) 0.5 MG tablet Take 0.5 mg by mouth 3 (three) times daily.      . Rasagiline Mesylate (AZILECT) 1 MG TABS Take 1 mg by mouth daily.       Marland Kitchen DISCONTD: pramipexole (MIRAPEX) 1 MG tablet Take 1 mg by mouth at bedtime.        Allergies  Allergen Reactions  . Amoxicillin   . Celebrex (Celecoxib)   . Demerol Other (See Comments)    Hallucinations   . Penicillins     REACTION: RASH    Past Medical History  Diagnosis Date  . GERD (gastroesophageal reflux disease) Pre 2002    Mild  . Ruptured appendix teens  . History of kidney stones   . 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 Parkinson disease 2004    Slowly progressive  . Arthritis     left hip     Past Surgical History  Procedure Date  . Neuro evaluation 12/19/2004    Dr. Sandria Manly, benign tremor  . Doppler echocardiography 01/10/2009    LV NML Mild LVH EF 60-65% aortic sclerosis w/0 stenosis   . Appendectomy   . Joint replacement     left hip replacement 08/14/11/[facility]   . Total hip revision 08/27/2011    Procedure: TOTAL HIP REVISION;  Surgeon: Shelda Pal, MD;  Location: [facility];  Service: Orthopedics;  Laterality: Left;    Family History  Problem Relation Age of Onset  . Arthritis Mother     knee replacement  . COPD Father     emphysema, smoker  . Heart disease Father     CHF  . Alcohol abuse Paternal Uncle     History   Social History  . Marital Status: Married    Spouse Name: N/A    Number of Children: 2  . Years of Education: N/A   Occupational History  . Optometrist     medically retired   Social History Main Topics  . Smoking status: Never Smoker   .  Smokeless tobacco: Never Used  . Alcohol Use: 3.5 oz/week    7 drink(s) per week     occassionally  . Drug Use: No  . Sexually Active: Yes   Other Topics Concern  . Not on file   Social History Narrative   Married, lives with wife2 daughtersHas living willWife has health care POA---then daughtersWould still accept CPR--but no prolonged artificial means (ventilator or tube feeds)   Review of Systems Appetite is good Weight is stable Bowels are fine     Objective:   Physical Exam  Constitutional: He is oriented to person, place, and time. He appears well-developed and well-nourished. No distress.  Neck: Normal range of motion. Neck supple. No thyromegaly present.  Cardiovascular: Normal rate, regular rhythm and intact distal pulses.  Exam reveals no gallop.   Murmur heard.      Soft aortic systolic murmur  Pulmonary/Chest: Effort  normal and breath sounds normal. No respiratory distress. He has no wheezes. He has no rales.  Abdominal: Soft. There is no tenderness.  Musculoskeletal: He exhibits no edema and no tenderness.  Lymphadenopathy:    He has no cervical adenopathy.  Neurological: He is alert and oriented to person, place, and time.       President "[employer]ill[employer]or, ?" [id number] D-[id number] Recall 3/3  Skin: No rash noted. No erythema.  Psychiatric: He has a normal mood and affect. His behavior is normal. Thought content normal.          Assessment & Plan:

## 2012-03-18 ENCOUNTER — Encounter: Payer: Self-pay | Admitting: *Deleted

## 2012-04-14 DIAGNOSIS — Z23 Encounter for immunization: Secondary | ICD-10-CM | POA: Diagnosis not present

## 2012-05-16 DIAGNOSIS — L905 Scar conditions and fibrosis of skin: Secondary | ICD-10-CM | POA: Diagnosis not present

## 2012-05-16 DIAGNOSIS — D485 Neoplasm of uncertain behavior of skin: Secondary | ICD-10-CM | POA: Diagnosis not present

## 2012-05-16 DIAGNOSIS — L821 Other seborrheic keratosis: Secondary | ICD-10-CM | POA: Diagnosis not present

## 2012-05-16 DIAGNOSIS — L570 Actinic keratosis: Secondary | ICD-10-CM | POA: Diagnosis not present

## 2012-05-16 DIAGNOSIS — Z85828 Personal history of other malignant neoplasm of skin: Secondary | ICD-10-CM | POA: Diagnosis not present

## 2012-05-25 DIAGNOSIS — G20 Parkinson's disease: Secondary | ICD-10-CM | POA: Diagnosis not present

## 2012-06-22 DIAGNOSIS — H6120 Impacted cerumen, unspecified ear: Secondary | ICD-10-CM | POA: Diagnosis not present

## 2012-06-22 DIAGNOSIS — G20 Parkinson's disease: Secondary | ICD-10-CM | POA: Diagnosis not present

## 2012-06-22 DIAGNOSIS — H903 Sensorineural hearing loss, bilateral: Secondary | ICD-10-CM | POA: Diagnosis not present

## 2012-08-24 DIAGNOSIS — M169 Osteoarthritis of hip, unspecified: Secondary | ICD-10-CM | POA: Diagnosis not present

## 2012-09-28 DIAGNOSIS — G20 Parkinson's disease: Secondary | ICD-10-CM | POA: Diagnosis not present

## 2012-11-09 ENCOUNTER — Telehealth: Payer: Self-pay | Admitting: *Deleted

## 2012-11-09 NOTE — Telephone Encounter (Signed)
It looks like at his last visit, Dr. love had already addressed taking additional Sinemet and how it could exacerbate dyskinesias. I would also discourage him from taking extra medicine because it can exacerbate abnormal movements with time. However, if he needs to, from time to time, he can take an additional half pill of levodopa in the evening, for example when he is planning to go out to eat or socialize otherwise. Pls advise patient, thanks.  Huston Foley, MD, PhD [facility])

## 2012-11-09 NOTE — Telephone Encounter (Signed)
Pt is currently following Sinemet regimen as prescribed by Dr Sandria Manly however he has had to add 0.5 to 1 tablet per day to maintain the desired effects.  He is concerned that he may create a dyskinesia by changing the prescribed regimen.  Please advise. [phone].

## 2012-11-15 ENCOUNTER — Other Ambulatory Visit: Payer: Self-pay

## 2012-11-15 MED ORDER — CARBIDOPA-LEVODOPA 25-100 MG PO TABS: ORAL_TABLET | ORAL | Status: DC

## 2012-11-15 NOTE — Telephone Encounter (Signed)
Former Love patient assigned to Dr Athar.  

## 2012-11-17 ENCOUNTER — Encounter: Payer: Self-pay | Admitting: Neurology

## 2012-11-17 ENCOUNTER — Ambulatory Visit (INDEPENDENT_AMBULATORY_CARE_PROVIDER_SITE_OTHER): Payer: Medicare Other | Admitting: Neurology

## 2012-11-17 VITALS — BP 125/68 | HR 74 | Temp 98.0°F | Ht 73.5 in | Wt 191.0 lb

## 2012-11-17 DIAGNOSIS — G20 Parkinson's disease: Secondary | ICD-10-CM

## 2012-11-17 DIAGNOSIS — G20A1 Parkinson's disease without dyskinesia, without mention of fluctuations: Secondary | ICD-10-CM

## 2012-11-17 NOTE — Patient Instructions (Addendum)
I think overall you are doing fairly well and are fairly stable at this point.   I do have some generic suggestions for you today:   I want you to take mirapex 1 mg: 1 1/2 pills at 7:30, 1 pill at 1:30 PM and 1 1/2 pill at 6:30 PM. Take your sinemet 25/100 mg: 1 pill at 7:30 AM, 1 pill at 11 AM, one at 2 PM and the last one at 6 PM.   Please make sure that you drink plenty of fluids. I would like for you to exercise daily for example in the form of walking 20-30 minutes every day, if you can. Please keep a regular sleep-wake schedule, keep regular meal times, do not skip any meals, eat  healthy snacks in between meals, such as fruit or nuts. Try to eat protein with every meal.   Engage in social activities in your community and with your family and try to keep up with current events by reading the newspaper or watching the news.  I do not think we need to make any other changes in your medications at this point. I think you're stable enough that I can see you back in 3 months, sooner if we need to. Please call us if you have any interim questions, concerns, or problems or updates to need to discuss.  Brett Canales is my clinical assistant and will answer any of your questions and relay your messages to me and will give you my messages.   Our phone number is [phone]. We also have an after hours call service for urgent matters and there is a physician on-call for urgent questions. For any emergencies you know to call 911 or go to the nearest emergency room.

## 2012-11-17 NOTE — Progress Notes (Signed)
Subjective:    Patient ID: Casey Tucker is a 72 y.o. male.  HPI  Interim history:   Casey Tucker is a 72 year old right-handed gentleman who presents for followup consultation of his Parkinson's disease. He is unaccompanied today. This is his first visit with me and he was previously following with Dr. Fayrene Fearing love and was last seen him recently on 09/28/2012, at which time Dr. love felt he was doing really well and did not change his medications for that reason. Dr. love noticed that the patient would take an extra tablet of Sinemet when he would go out. The patient has an underlying medical history of orbital mass, chronic left hip pain, status post left hip surgery last year twice. His current medications are fish oil, adult size aspirin, magnesium, Sinemet 25-100 mg strength one tablet 4 times a day at 7:30, 10:30, 1:30, 4:30. Rasagiline 1 mg once daily, clonazepam 0.5 mg 2 tablets at night, Mirapex long-acting 1.5 mg daily, coenzyme Q 10 400 mg 3 times a day, pramipexole 1 mg strength half a tablet 5 times a day at 7:30, 10:30, 1:30, 4:30 and 1 pill at bedtime along with Flexeril 20 mg at night.  I reviewed Dr. Imagene Gurney prior notes and the patient's records and below is a summary of that review:  72 year old right-handed gentleman who was first seen by Dr. love in 2004 with right hand and right arm tremor. He had been on Azilect and Mirapex until 2010, and was started on Sinemet 25-100 mg strength in February 2010. He had some dystonic pain syndrome which improved with Flexeril and magnesium. He is taking a combination of immediate release and long-acting Mirapex. He also takes rasagiline and clonazepam. Occasionally will take an additional half or one pill of levodopa. He denies any daytime sleepiness, compulsive behavior, hallucinations, postural hypotension, depression or memory loss. He is independent in his ADLs. He reports of time of about 2 hours per day.  The patient presents for a urgent  followup appointment rather than his routine four-month followup because of more off time with freezing and gait dysfunction. He went for acupuncture for the second session and felt he did well. Also, in the last 2 days, he has taken 1 mg tid and 0.5 mg bid for a total of 4 mg of pramipexole.   His Past Medical History Is Significant For: Past Medical History  Diagnosis Date  . GERD (gastroesophageal reflux disease) Pre 2002    Mild  . Ruptured appendix teens  . History of kidney stones   . 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 Parkinson disease 2004    Slowly progressive  . Arthritis     left hip     His Past Surgical History Is Significant For: Past Surgical History  Procedure Laterality Date  . Neuro evaluation  12/19/2004    Dr. Sandria Manly, benign tremor  . Doppler echocardiography  01/10/2009    LV NML Mild LVH EF 60-65% aortic sclerosis w/0 stenosis   . Appendectomy    . Joint replacement      left hip replacement 08/14/11/[place]   . Total hip revision  08/27/2011    Procedure: TOTAL HIP REVISION;  Surgeon: Shelda Pal, MD;  Location: [facility];  Service: Orthopedics;  Laterality: Left;    His Family History Is Significant For: Family History  Problem Relation Age of Onset  . Arthritis Mother     knee  replacement  . COPD Father     emphysema, smoker  . Heart disease Father     CHF  . Alcohol abuse Paternal Uncle     His Social History Is Significant For: History   Social History  . Marital Status: Married    Spouse Name: N/A    Number of Children: 2  . Years of Education: N/A   Occupational History  . Optometrist     medically retired   Social History Main Topics  . Smoking status: Never Smoker   . Smokeless tobacco: Never Used  . Alcohol Use: 3.5 oz/week    7 drink(s) per week     Comment: occassionally  . Drug Use: No  . Sexually Active: Yes   Other Topics Concern  .  None   Social History Narrative   Married, lives with wife   2 daughters   Has living will   Wife has health care POA---then daughters   Would still accept CPR--but no prolonged artificial means (ventilator or tube feeds)    His Allergies Are:  Allergies  Allergen Reactions  . Amoxicillin   . Celebrex (Celecoxib)   . Demerol Other (See Comments)    Hallucinations   . Penicillins     REACTION: RASH  :   His Current Medications Are:  Outpatient Encounter Prescriptions as of 11/17/2012  Medication Sig Dispense Refill  . carbidopa-levodopa (SINEMET IR) 25-100 MG per tablet Take one tablet po QID 5 days per week and take one tablet six times daily 2 days per week  135 tablet  3  . clonazePAM (KLONOPIN) 0.5 MG tablet Take 2-4 tablets (1-2 mg total) by mouth at bedtime as needed.  60 tablet  1  . cyclobenzaprine (FLEXERIL) 10 MG tablet Take 20 mg by mouth at bedtime.       . pramipexole (MIRAPEX) 0.5 MG tablet Take 0.5 mg by mouth 3 (three) times daily.      . Rasagiline Mesylate (AZILECT) 1 MG TABS Take 1 mg by mouth daily.        No facility-administered encounter medications on file as of 11/17/2012.  :   Review of Systems  Objective:  Neurologic Exam  Physical Exam Physical Examination:   Filed Vitals:   11/17/12 1147  BP: 125/68  Pulse: 74  Temp: 98 F (36.7 C)    General Examination: The patient is a very pleasant 72 y.o. male in no acute distress.  HEENT: Normocephalic, atraumatic, pupils are equal, round and reactive to light and accommodation. Funduscopic exam is normal with sharp disc margins noted. Extraocular tracking shows mild saccadic breakdown without nystagmus noted. There is no limitation to his gaze. There is mild decrease in eye blink rate. Hearing is intact. Face is symmetric with moderate facial masking and normal facial sensation. There is no lip, neck or jaw tremor. Neck is mildly rigid with intact passive ROM. There are no carotid bruits on  auscultation. Oropharynx exam reveals moderate mouth dryness. No significant airway crowding is noted. Mallampati is class II. Tongue protrudes centrally and palate elevates symmetrically.   There is no drooling.   Chest: is clear to auscultation without wheezing, rhonchi or crackles noted.  Heart: sounds are regular and normal without murmurs, rubs or gallops noted.   Abdomen: is soft, non-tender and non-distended with normal bowel sounds appreciated on auscultation.  Extremities: There is no pitting edema in the distal lower extremities bilaterally. Pedal pulses are intact.  Skin: is warm and dry with no  trophic changes noted.  Musculoskeletal: exam reveals no obvious joint deformities, tenderness or joint swelling or erythema.  Neurologically:  Mental status: The patient is awake and alert, paying good  attention. He is able to provide the history. His wife provides details. He is oriented to: person, place, time/date, situation, day of week, month of year and year. His memory, attention, language and knowledge are intact. There is no aphasia, agnosia, apraxia or anomia. There is a mild degree of bradyphrenia. Speech is mildly hypophonic with no dysarthria noted. Mood is congruent and affect is normal.  Cranial nerves are as described above under HEENT exam. In addition, shoulder shrug is normal with equal shoulder height noted.  Motor exam: Normal bulk, and strength for age is noted. There are very mild, rare LE dyskinesias noted. Tone is mildly rigid with absence of cogwheeling. There is overall mild bradykinesia. There is no drift or rebound. There is a mild resting tremor in the right upper extremity. The tremor is intermittent.  Romberg is negative. Reflexes are 1+ in the upper extremities and 1+ in the lower extremities. Toes are downgoing bilaterally. Fine motor skills exam reveals: Finger taps are moderately impaired on the right and mildly impaired on the left. Hand movements are  mildly impaired on the right and mildly impaired on the left. RAP (rapid alternating patting) is moderately impaired on the right and mildly impaired on the left. Foot taps are moderately impaired on the right and moderately impaired on the left. Foot agility (in the form of heel stomping) is mildly impaired on the right and mildly impaired on the left.    Cerebellar testing shows no dysmetria or intention tremor on finger to nose testing. Heel to shin is unremarkable bilaterally. There is no truncal or gait ataxia.   Sensory exam is intact to light touch, pinprick, vibration, temperature sense and proprioception in the upper and lower extremities.   Gait, station and balance exan: He stands up from the seated position with no difficulty and needs no assistance. No veering to one side is noted. He is not noted to lean to the side. Posture is mildly stooped. Stance is narrow-based. He walks with decrease in stride length and pace and deliberate appearing arm swing. He turns in en bloc. Balance is preserved.   Assessment and Plan:   Assessment and Plan:  In summary, Casey Tucker is a very pleasant 72 y.o.-year old male with a history of right-sided predominant Parkinson's disease, who has remained fairly stable but has had some motor fluctuations and also mild dyskinesias. I suggested that he streamline his medication regimen a little bit. To that and, I suggested that he take mirapex 1 mg: 1 1/2 pills at 7:30, 1 pill at 1:30 PM and 1 1/2 pill at 6:30 PM and his C/L 25/100 mg: 1 pill at 7:30 AM, 1 pill at 11 AM, one at 2 PM and the last one at 6 PM.  I had a long chat with the patient and his wife about my findings and the diagnosis of PD, the prognosis and treatment options. We talked about medical treatments and non-pharmacological approaches. We talked about maintaining a healthy lifestyle in general. I encouraged the patient to eat healthy, exercise daily and keep well hydrated, to keep a scheduled  bedtime and wake time routine, to not skip any meals and eat healthy snacks in between meals.   I answered all their questions today and the patient and his wife were in agreement with the  above outlined plan. I would like to see the patient back in 2 months, sooner if the need arises and encouraged them to call with any interim questions, concerns, problems or updates and refill requests.

## 2012-11-22 DIAGNOSIS — L570 Actinic keratosis: Secondary | ICD-10-CM | POA: Diagnosis not present

## 2012-11-22 DIAGNOSIS — L821 Other seborrheic keratosis: Secondary | ICD-10-CM | POA: Diagnosis not present

## 2012-11-22 DIAGNOSIS — Z85828 Personal history of other malignant neoplasm of skin: Secondary | ICD-10-CM | POA: Diagnosis not present

## 2012-11-23 ENCOUNTER — Other Ambulatory Visit: Payer: Self-pay | Admitting: Specialist

## 2012-11-23 DIAGNOSIS — L02419 Cutaneous abscess of limb, unspecified: Secondary | ICD-10-CM | POA: Diagnosis not present

## 2012-11-23 DIAGNOSIS — L03119 Cellulitis of unspecified part of limb: Secondary | ICD-10-CM | POA: Diagnosis not present

## 2012-11-23 DIAGNOSIS — M76899 Other specified enthesopathies of unspecified lower limb, excluding foot: Secondary | ICD-10-CM | POA: Diagnosis not present

## 2012-11-24 DIAGNOSIS — L03119 Cellulitis of unspecified part of limb: Secondary | ICD-10-CM | POA: Diagnosis not present

## 2012-11-28 LAB — MISC AER/ANAEROBIC CULT.

## 2012-11-29 DIAGNOSIS — H2510 Age-related nuclear cataract, unspecified eye: Secondary | ICD-10-CM | POA: Diagnosis not present

## 2012-11-29 DIAGNOSIS — H5200 Hypermetropia, unspecified eye: Secondary | ICD-10-CM | POA: Diagnosis not present

## 2012-12-01 ENCOUNTER — Other Ambulatory Visit: Payer: Self-pay

## 2012-12-01 MED ORDER — PRAMIPEXOLE DIHYDROCHLORIDE ER 1.5 MG PO TB24: 1.5000 mg | ORAL_TABLET | Freq: Every day | ORAL | Status: DC

## 2012-12-01 MED ORDER — PRAMIPEXOLE DIHYDROCHLORIDE 1 MG PO TABS: 0.5000 mg | ORAL_TABLET | Freq: Every day | ORAL | Status: DC

## 2012-12-01 MED ORDER — RASAGILINE MESYLATE 1 MG PO TABS: 1.0000 mg | ORAL_TABLET | Freq: Every day | ORAL | Status: DC

## 2012-12-01 MED ORDER — CLONAZEPAM 0.5 MG PO TABS: 1.0000 mg | ORAL_TABLET | Freq: Every evening | ORAL | Status: DC

## 2012-12-01 MED ORDER — CYCLOBENZAPRINE HCL 10 MG PO TABS: 20.0000 mg | ORAL_TABLET | Freq: Every day | ORAL | Status: DC

## 2012-12-01 NOTE — Telephone Encounter (Signed)
Former [facility] patient assigned to Dr Frances Furbish.  Has an appt scheduled in July.

## 2012-12-21 ENCOUNTER — Telehealth: Payer: Self-pay | Admitting: Neurology

## 2012-12-21 MED ORDER — PRAMIPEXOLE DIHYDROCHLORIDE 1 MG PO TABS: ORAL_TABLET | ORAL | Status: DC

## 2012-12-21 NOTE — Telephone Encounter (Signed)
Mirapex was sent to the patients pharmacy on 05/15.  They confirmed receipt of the medication on that day.  I called the patient.  He says he spoke with pharmacy and they told him we never sent Rx.  I called the pharmacy.  They said they did get Rx, but would like it resent .  I have resent Rx.

## 2012-12-26 ENCOUNTER — Telehealth: Payer: Self-pay | Admitting: Neurology

## 2012-12-26 NOTE — Telephone Encounter (Signed)
LMVM for pt re: question about medication.

## 2012-12-28 NOTE — Telephone Encounter (Signed)
Pt calling and is heading out to town (hunting/fishing).  Is asking about taking additional 1 25/100mg  tablet at around 1600 (1/2 tab) and then again 1730 (1/2 tablet) for 2-3 consecutive days.  Dr. Frances Furbish did give him the ok to take 1/2 tab occasionally for this purpose when he is adding more stress to his body.  He is just not sure regarding the 2-3 days in a row.  Please advise.

## 2012-12-28 NOTE — Telephone Encounter (Signed)
Yes, this should be fine. -VRP

## 2012-12-28 NOTE — Telephone Encounter (Signed)
I spoke to pt and relayed per Dr. Marjory Lies this should be ok.   He verbalized understanding.

## 2013-01-03 ENCOUNTER — Telehealth: Payer: Self-pay | Admitting: Neurology

## 2013-01-03 NOTE — Telephone Encounter (Signed)
Casey Tucker stated that his medication (Sinemet IR) stops working 1/2 hour before he is due to take his next dose and that it takes approximately 1/2 hour for the medication to work after he has taken it; he would like to know if there is another medication, or if he can adjust his current medication, to cover this 1 hour that his symptoms are not controlled.  He also inquired into possible electronic stimulation therapy.  Please advise.

## 2013-01-04 NOTE — Telephone Encounter (Signed)
Please make follow up appointment with Dr Frances Furbish. The patient may start to use a mix of immediate release and extended release sinemet to bridge over the delayed action.   We refer to DBS evaluation and placement at [facility]mer  ,  And we have material about DBS in the office that the patient can take home.

## 2013-01-10 DIAGNOSIS — H6120 Impacted cerumen, unspecified ear: Secondary | ICD-10-CM | POA: Diagnosis not present

## 2013-01-10 DIAGNOSIS — H903 Sensorineural hearing loss, bilateral: Secondary | ICD-10-CM | POA: Diagnosis not present

## 2013-01-10 DIAGNOSIS — G20 Parkinson's disease: Secondary | ICD-10-CM | POA: Diagnosis not present

## 2013-01-12 ENCOUNTER — Telehealth: Payer: Self-pay | Admitting: Neurology

## 2013-02-01 DIAGNOSIS — G20 Parkinson's disease: Secondary | ICD-10-CM | POA: Diagnosis not present

## 2013-02-06 ENCOUNTER — Ambulatory Visit (INDEPENDENT_AMBULATORY_CARE_PROVIDER_SITE_OTHER): Payer: Medicare Other | Admitting: Neurology

## 2013-02-06 ENCOUNTER — Encounter: Payer: Self-pay | Admitting: Neurology

## 2013-02-06 VITALS — BP 129/71 | HR 77 | Ht 72.0 in | Wt 186.0 lb

## 2013-02-06 DIAGNOSIS — G249 Dystonia, unspecified: Secondary | ICD-10-CM

## 2013-02-06 DIAGNOSIS — R279 Unspecified lack of coordination: Secondary | ICD-10-CM

## 2013-02-06 DIAGNOSIS — G20 Parkinson's disease: Secondary | ICD-10-CM | POA: Diagnosis not present

## 2013-02-06 NOTE — Patient Instructions (Addendum)
I think overall you are doing fairly well but I do want to suggest a few things today:  Remember to drink plenty of fluid, eat healthy meals and do not skip any meals. Try to eat protein with a every meal and eat a healthy snack such as fruit or nuts in between meals. Try to keep a regular sleep-wake schedule and try to exercise daily, particularly in the form of walking, 20-30 minutes a day, if you can.   As far as your medications are concerned, I would like to suggest no changes.   As far as diagnostic testing: no new test today  I would like to see you back in 3 months after your DBS, sooner if we need to. Please call us with any interim questions, concerns, problems, updates or refill requests.  Brett Canales is my clinical assistant and will answer any of your questions and relay your messages to me and also relay most of my messages to you.  Our phone number is [phone]. We also have an after hours call service for urgent matters and there is a physician on-call for urgent questions. For any emergencies you know to call 911 or go to the nearest emergency room.

## 2013-02-06 NOTE — Progress Notes (Signed)
Subjective:    Patient ID: Casey Tucker is a 72 y.o. male.  HPI  Interim history:   Casey Tucker is a very pleasant 72 year old right-handed gentleman who presents for followup consultation of his Parkinson's disease. He is unaccompanied today. I first met him on 11/17/2012 and he previously followed with Casey Tucker. He was last seen by Casey Tucker on 09/28/2012. He saw Casey Tucker in 2004 for the first with Sx going back to 2004. The patient has an underlying medical history of orbital mass, chronic left hip pain, status post hip surgery,. His symptoms started in the early 2000s with right hand and right arm tremor. He was on Azilect and Mirapex and was started on Sinemet in February 2010. He has a history of dystonic pain syndrome which improved with Flexeril and magnesium. He had presented in May for urgent followup due to increased freezing episodes and gait dysfunction. He had undergone acupuncture. At the time of his first visit with me I noted mild dyskinesias. I suggested that he take Mirapex 1-1/2 pills at 7:30, one pill at 1 30-1/2 pills at 6:30 and carbidopa levodopa one pill 4 times a day namely at 7:30, 11, 2 PM and 6 PM. He often noticed wears off after 3 or 3 1/2 hours after a dose. He estimates, that he has 3-4 hours of off time each day, worse when he is working in the yard. He has freezing episodes during off time. He mood is stable and memory is good. He is scheduled for L sided DBS at [facility] some time in September under Casey Tucker.    His Past Medical History Is Significant For: Past Medical History  Diagnosis Date  . GERD (gastroesophageal reflux disease) Pre 2002    Mild  . Ruptured appendix teens  . History of kidney stones   . 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 Parkinson disease 2004    Slowly progressive  . Arthritis     left hip     His Past Surgical History Is Significant  For: Past Surgical History  Procedure Laterality Date  . Neuro evaluation  12/19/2004    Casey Tucker, benign tremor  . Doppler echocardiography  01/10/2009    LV NML Mild LVH EF 60-65% aortic sclerosis w/0 stenosis   . Appendectomy    . Joint replacement      left hip replacement 08/14/11/[place]   . Total hip revision  08/27/2011    Procedure: TOTAL HIP REVISION;  Surgeon: Casey Pal, MD;  Location: [facility];  Service: Orthopedics;  Laterality: Left;    His Family History Is Significant For: Family History  Problem Relation Age of Onset  . Arthritis Mother     knee replacement  . COPD Father     emphysema, smoker  . Heart disease Father     CHF  . Alcohol abuse Paternal Uncle     His Social History Is Significant For: History   Social History  . Marital Status: Married    Spouse Name: N/A    Number of Children: 2  . Years of Education: N/A   Occupational History  . Optometrist     medically retired   Social History Main Topics  . Smoking status: Never Smoker   . Smokeless tobacco: Never Used  . Alcohol Use: 3.5 oz/week    7 drink(s) per week     Comment: occassionally  .  Drug Use: No  . Sexually Active: Yes   Other Topics Concern  . None   Social History Narrative   Married, lives with wife   2 daughters   Has living will   Wife has health care POA---then daughters   Would still accept CPR--but no prolonged artificial means (ventilator or tube feeds)    His Allergies Are:  Allergies  Allergen Reactions  . Amoxicillin   . Celebrex (Celecoxib)   . Demerol Other (See Comments)    Hallucinations   . Penicillins     REACTION: RASH  :   His Current Medications Are:  Outpatient Encounter Prescriptions as of 02/06/2013  Medication Sig Dispense Refill  . ALPRAZolam (XANAX) 0.5 MG tablet       . carbidopa-levodopa (SINEMET IR) 25-100 MG per tablet Take one tablet po QID 5 days per week and take one tablet six times daily 2 days per week  135 tablet  3  .  clonazePAM (KLONOPIN) 0.5 MG tablet Take 2 tablets (1 mg total) by mouth every evening.  60 tablet  2  . cyclobenzaprine (FLEXERIL) 10 MG tablet Take 2 tablets (20 mg total) by mouth at bedtime.  60 tablet  2  . pramipexole (MIRAPEX) 1 MG tablet Take 1 & 1/2 at 7:30AM, 1 at 1:30PM and 1 & 1/2 at 6:30PM  120 tablet  2  . Pramipexole Dihydrochloride (MIRAPEX ER) 1.5 MG TB24 Take 1 tablet (1.5 mg total) by mouth daily. On hunting days  30 tablet  2  . rasagiline (AZILECT) 1 MG TABS Take 1 tablet (1 mg total) by mouth daily.  30 tablet  2  . sulfamethoxazole-trimethoprim (BACTRIM DS) 800-160 MG per tablet       . [DISCONTINUED] cephALEXin (KEFLEX) 500 MG capsule        No facility-administered encounter medications on file as of 02/06/2013.  : Review of Systems  All other systems reviewed and are negative.    Objective:  Neurologic Exam  Physical Exam Physical Examination:   Filed Vitals:   02/06/13 1015  BP: 129/71  Pulse: 77    General Examination: The patient is a very pleasant 72 y.o. male in no acute distress.  HEENT: Normocephalic, atraumatic, pupils are equal, round and reactive to light and accommodation. Extraocular tracking shows mild saccadic breakdown without nystagmus noted. There is no limitation to his gaze. There is mild decrease in eye blink rate. Hearing is intact. Face is symmetric with moderate facial masking and normal facial sensation. There is no lip, neck or jaw tremor. Neck is mildly rigid with intact passive ROM. There are no carotid bruits on auscultation. Oropharynx exam reveals moderate mouth dryness. No significant airway crowding is noted. Mallampati is class II. Tongue protrudes centrally and palate elevates symmetrically.   There is no drooling.   Chest: is clear to auscultation without wheezing, rhonchi or crackles noted.  Heart: sounds are regular and normal without murmurs, rubs or gallops noted.   Abdomen: is soft, non-tender and non-distended with  normal bowel sounds appreciated on auscultation.  Extremities: There is no pitting edema in the distal lower extremities bilaterally. Pedal pulses are intact.  Skin: is warm and dry with no trophic changes noted.  Musculoskeletal: exam reveals no obvious joint deformities, tenderness or joint swelling or erythema.  Neurologically:  Mental status: The patient is awake and alert, paying good  attention. He is able to provide the history. He is oriented to: person, place, time/date, situation, day of week, month  of year and year. His memory, attention, language and knowledge are intact. There is no aphasia, agnosia, apraxia or anomia. There is a mild degree of bradyphrenia. Speech is mildly hypophonic with no dysarthria noted. Mood is congruent and affect is normal.  Cranial nerves are as described above under HEENT exam. In addition, shoulder shrug is normal with equal shoulder height noted.  Motor exam: Normal bulk, and strength for age is noted. There are very mild, rare LE dyskinesias noted. Tone is mildly rigid with absence of cogwheeling. There is overall mild bradykinesia. There is no drift or rebound. There is a mild resting tremor in the right upper extremity. The tremor is intermittent.  Romberg is negative. Reflexes are 1+ in the upper extremities and 1+ in the lower extremities. Toes are downgoing bilaterally. Fine motor skills exam reveals: Finger taps are moderately impaired on the right and mildly impaired on the left. Hand movements are mildly impaired on the right and mildly impaired on the left. RAP (rapid alternating patting) is moderately impaired on the right and mildly impaired on the left. Foot taps are moderately impaired on the right and moderately impaired on the left. Foot agility (in the form of heel stomping) is mildly impaired on the right and mildly impaired on the left.    Cerebellar testing shows no dysmetria or intention tremor on finger to nose testing. Heel to shin is  unremarkable bilaterally. There is no truncal or gait ataxia.   Sensory exam is intact to light touch.   Gait, station and balance exan: He stands up from the seated position with no difficulty and needs no assistance. No veering to one side is noted. He is not noted to lean to the side. Posture is mildly stooped. Stance is narrow-based. He walks with decrease in stride length and pace and deliberate appearing arm swing. He turns in en bloc. Balance is preserved.   Assessment and Plan:   In summary, Casey Tucker is a very pleasant 72 y.o.-year old male with a history of advanced right-sided predominant Parkinson's disease, who has remained fairly stable but has had some motor fluctuations with freezing of about 3 - 3 1/2 hours per day and also mild dyskinesias. He has remained stable for me and he is scheduled for L sided DBS soon, which I also promote. I do believe, he is a good surgical candidate and the DBS is medically indicated in his case d/t stable memory, no significant mood disorder and good response to medications for PD, but evidence of wearing off, freezing and dyskinesias. I will not make changes to his meds today and suggested a FU in October. He was given samples for Azilect for 2 weeks and did not need refills on the other medications.  He uses his walker at night when going to the bathroom. I had a long chat with the patient regarding the diagnosis of PD, the prognosis and treatment options. We talked about medical treatments and non-pharmacological approaches and the DBS again. We talked about maintaining a healthy lifestyle in general. I encouraged the patient to eat healthy, exercise daily and keep well hydrated, to keep a scheduled bedtime and wake time routine, to not skip any meals and eat healthy snacks in between meals. I encouraged him to stay active. We talked about sun protection and the importance of staying hydrated when outside.    I answered all his questions today and  the patient was in agreement with the above outlined plan. I would like  to see the patient back after the DBS.

## 2013-02-17 HISTORY — PX: DEEP BRAIN STIMULATOR PLACEMENT: SHX608

## 2013-03-10 DIAGNOSIS — G20 Parkinson's disease: Secondary | ICD-10-CM | POA: Diagnosis not present

## 2013-03-14 DIAGNOSIS — G20 Parkinson's disease: Secondary | ICD-10-CM | POA: Diagnosis not present

## 2013-03-14 DIAGNOSIS — G20A1 Parkinson's disease without dyskinesia, without mention of fluctuations: Secondary | ICD-10-CM | POA: Diagnosis not present

## 2013-03-14 DIAGNOSIS — Z96649 Presence of unspecified artificial hip joint: Secondary | ICD-10-CM | POA: Diagnosis not present

## 2013-03-21 ENCOUNTER — Encounter: Payer: Medicare Other | Admitting: Internal Medicine

## 2013-03-22 ENCOUNTER — Telehealth: Payer: Self-pay | Admitting: Neurology

## 2013-03-22 ENCOUNTER — Other Ambulatory Visit: Payer: Self-pay

## 2013-03-22 DIAGNOSIS — G20 Parkinson's disease: Secondary | ICD-10-CM | POA: Diagnosis not present

## 2013-03-22 MED ORDER — RASAGILINE MESYLATE 1 MG PO TABS: 1.0000 mg | ORAL_TABLET | Freq: Every day | ORAL | Status: DC

## 2013-03-22 MED ORDER — CARBIDOPA-LEVODOPA 25-100 MG PO TABS: ORAL_TABLET | ORAL | Status: DC

## 2013-03-22 MED ORDER — PRAMIPEXOLE DIHYDROCHLORIDE 1 MG PO TABS: ORAL_TABLET | ORAL | Status: DC

## 2013-03-31 ENCOUNTER — Encounter: Payer: Medicare Other | Admitting: Internal Medicine

## 2013-04-05 ENCOUNTER — Other Ambulatory Visit: Payer: Self-pay

## 2013-04-05 MED ORDER — CYCLOBENZAPRINE HCL 10 MG PO TABS: 20.0000 mg | ORAL_TABLET | Freq: Every day | ORAL | Status: DC

## 2013-04-05 NOTE — Telephone Encounter (Signed)
Patient called requesting refills on Flexeril.  He would like the Rx sent to [facility].

## 2013-04-28 DIAGNOSIS — Z23 Encounter for immunization: Secondary | ICD-10-CM | POA: Diagnosis not present

## 2013-05-17 DIAGNOSIS — G20 Parkinson's disease: Secondary | ICD-10-CM | POA: Diagnosis not present

## 2013-05-22 ENCOUNTER — Encounter: Payer: Self-pay | Admitting: Neurology

## 2013-05-22 ENCOUNTER — Ambulatory Visit (INDEPENDENT_AMBULATORY_CARE_PROVIDER_SITE_OTHER): Payer: Medicare Other | Admitting: Neurology

## 2013-05-22 ENCOUNTER — Encounter (INDEPENDENT_AMBULATORY_CARE_PROVIDER_SITE_OTHER): Payer: Self-pay

## 2013-05-22 VITALS — BP 133/65 | HR 83 | Temp 97.5°F | Ht 72.0 in | Wt 188.0 lb

## 2013-05-22 DIAGNOSIS — G249 Dystonia, unspecified: Secondary | ICD-10-CM

## 2013-05-22 DIAGNOSIS — Z9689 Presence of other specified functional implants: Secondary | ICD-10-CM

## 2013-05-22 DIAGNOSIS — G20 Parkinson's disease: Secondary | ICD-10-CM

## 2013-05-22 DIAGNOSIS — R279 Unspecified lack of coordination: Secondary | ICD-10-CM | POA: Diagnosis not present

## 2013-05-22 DIAGNOSIS — Z9889 Other specified postprocedural states: Secondary | ICD-10-CM

## 2013-05-22 NOTE — Patient Instructions (Addendum)
I think your Parkinson's disease has remained fairly stable, which is reassuring. Nevertheless, as you know, this disease does progress with time. It can affect your balance, your memory, your mood, your bowel and bladder function, your posture, balance and walking. Overall you are doing fairly well but I do want to suggest a few things today:  Remember to drink plenty of fluid, eat healthy meals and do not skip any meals. Try to eat protein with a every meal and eat a healthy snack such as fruit or nuts in between meals. Try to keep a regular sleep-wake schedule and try to exercise daily, particularly in the form of walking, 20-30 minutes a day, if you can.   Taking your medication on schedule is key.   Try to stay active physically and mentally. Engage in social activities in your community and with your family and try to keep up with current events by reading the newspaper or watching the news. Try to do word puzzles and you may like to do word puzzles and brain games on the computer such as on [email or web address]   As far as your medications are concerned, I would like to suggest that you take your current medication with the following additional changes: reduce Mirapex to 1 pill three times a day. Call in about 3 months, to discuss going down on Levodopa to alternate 1 pill with 1/2 pill, stay with 4 times a day schedule.     As far as diagnostic testing, I will order: no new test.   I would like to see you back in 6 months, sooner if we need to. Please call us with any interim questions, concerns, problems, updates or refill requests.  Please also call us for any test results so we can go over those with you on the phone. Brett Canales is my clinical assistant and will answer any of your questions and relay your messages to me and also relay most of my messages to you.  Our phone number is [phone]. We also have an after hours call service for urgent matters and there is a physician on-call for urgent  questions, that cannot wait till the next work day. For any emergencies you know to call 911 or go to the nearest emergency room.

## 2013-05-22 NOTE — Progress Notes (Signed)
Subjective:    Patient ID: YAHMIR SOKOLOV is a 72 y.o. male.  HPI  Interim history:   Mr. Hansley is a very pleasant 72 year old right-handed gentleman who presents for followup consultation of his right-sided predominant Parkinson's disease. He is unaccompanied today. I last saw him on 02/06/2013, at which time he reported that he was scheduled for left-sided DBS placement at [facility] in September under neurosurgeon Dr. Sherlynn Carbon. I did not make any medication changes at the time. He was stable at the time. He reported freezing episodes during off time. His mood has been stable and memory has been good. He has had dyskinesias. Since his DBS placement on 03/14/13, he reports feeling very well, he can get up at night without problems, he had no complications and no SEs. He started with programming about a week later and has had great results. He has noted dyskinesias in his R leg. He would like to reduce medication if possible. His mood is good and memory is stable. He is in good spirits. He is taking the medication as listed below. He is requesting 4 weeks worth of samples for Azilect. I first met him on 11/17/2012 and he previously followed with Dr. Fayrene Fearing love. He was last seen by Dr. love on 09/28/2012. He saw Dr. Sandria Manly in 2004 for the first with Sx going back to 2004. The patient has an underlying medical history of orbital mass, chronic left hip pain, status post hip surgery,. His symptoms started in the early 2000s with right hand and right arm tremor. He was on Azilect and Mirapex and was started on Sinemet in February 2010. He has a history of dystonic pain syndrome which improved with Flexeril and magnesium. He had presented in May for urgent followup due to increased freezing episodes and gait dysfunction. He had undergone acupuncture. At the time of his first visit with me I noted mild dyskinesias. I suggested that he take Azilect 1 mg, daily, Mirapex 1-1/2 pills at 7:30, one pill at 1 30-1/2 pills  at 6:30 and carbidopa levodopa one pill 4 times a day namely at 7:30, 11, 2 PM and 6 PM. He often noticed wears off after 3 or 3 1/2 hours after a dose. He estimated having 3-4 hours of off time each day, worse when he is was active, such as after working in the yard.    His Past Medical History Is Significant For: Past Medical History  Diagnosis Date  . GERD (gastroesophageal reflux disease) Pre 2002    Mild  . Ruptured appendix teens  . History of kidney stones   . 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 Parkinson disease 2004    Slowly progressive  . Arthritis     left hip     His Past Surgical History Is Significant For: Past Surgical History  Procedure Laterality Date  . Neuro evaluation  12/19/2004    Dr. Sandria Manly, benign tremor  . Doppler echocardiography  01/10/2009    LV NML Mild LVH EF 60-65% aortic sclerosis w/0 stenosis   . Appendectomy    . Joint replacement      left hip replacement 08/14/11/[place]   . Total hip revision  08/27/2011    Procedure: TOTAL HIP REVISION;  Surgeon: Shelda Pal, MD;  Location: [facility];  Service: Orthopedics;  Laterality: Left;    His Family History Is Significant For: Family History  Problem Relation Age of  Onset  . Arthritis Mother     knee replacement  . COPD Father     emphysema, smoker  . Heart disease Father     CHF  . Alcohol abuse Paternal Uncle     His Social History Is Significant For: History   Social History  . Marital Status: Married    Spouse Name: N/A    Number of Children: 2  . Years of Education: N/A   Occupational History  . Optometrist     medically retired   Social History Main Topics  . Smoking status: Never Smoker   . Smokeless tobacco: Never Used  . Alcohol Use: 3.5 oz/week    7 drink(s) per week     Comment: occassionally  . Drug Use: No  . Sexual Activity: Yes   Other Topics Concern  . None   Social History  Narrative   Married, lives with wife   2 daughters   Has living will   Wife has health care POA---then daughters   Would still accept CPR--but no prolonged artificial means (ventilator or tube feeds)    His Allergies Are:  Allergies  Allergen Reactions  . Amoxicillin   . Celebrex [Celecoxib]   . Demerol Other (See Comments)    Hallucinations   . Penicillins     REACTION: RASH  :   His Current Medications Are:  Outpatient Encounter Prescriptions as of 05/22/2013  Medication Sig  . ALPRAZolam (XANAX) 0.5 MG tablet   . carbidopa-levodopa (SINEMET IR) 25-100 MG per tablet Take one tablet po QID 5 days per week and take one tablet six times daily 2 days per week  . clonazePAM (KLONOPIN) 0.5 MG tablet Take 2 tablets (1 mg total) by mouth every evening.  . cyclobenzaprine (FLEXERIL) 10 MG tablet Take 2 tablets (20 mg total) by mouth at bedtime.  . pramipexole (MIRAPEX) 1 MG tablet Take 1 & 1/2 at 7:30AM, 1 at 1:30PM and 1 & 1/2 at 6:30PM  . Pramipexole Dihydrochloride (MIRAPEX ER) 1.5 MG TB24 Take 1 tablet (1.5 mg total) by mouth daily. On hunting days  . rasagiline (AZILECT) 1 MG TABS tablet Take 1 tablet (1 mg total) by mouth daily.  Marland Kitchen sulfamethoxazole-trimethoprim (BACTRIM DS) 800-160 MG per tablet   :  Review of Systems:  Out of a complete 14 point review of systems, all are reviewed and negative with the exception of these symptoms as listed below:   Review of Systems  Constitutional: Negative.   HENT: Negative.   Eyes: Negative.   Respiratory: Negative.   Cardiovascular: Negative.   Gastrointestinal: Negative.   Endocrine: Negative.   Genitourinary: Negative.   Musculoskeletal: Negative.   Skin: Negative.   Allergic/Immunologic: Negative.   Neurological: Negative.   Hematological: Negative.   Psychiatric/Behavioral: Negative.   All other systems reviewed and are negative.    Objective:  Neurologic Exam  Physical Exam Physical Examination:   Filed Vitals:    05/22/13 0850  BP: 133/65  Pulse: 83  Temp: 97.5 F (36.4 C)    General Examination: The patient is a very pleasant 72 y.o. male in no acute distress.  HEENT: Normocephalic, atraumatic, pupils are equal, round and reactive to light and accommodation. Extraocular tracking shows mild saccadic breakdown without nystagmus noted. There is no limitation to his gaze. There is mild decrease in eye blink rate. Hearing is intact. Face is symmetric with moderate facial masking and normal facial sensation. There is no lip, neck or jaw tremor. Neck is mildly  rigid with intact passive ROM. There are no carotid bruits on auscultation. Oropharynx exam reveals moderate mouth dryness. No significant airway crowding is noted. Mallampati is class II. Tongue protrudes centrally and palate elevates symmetrically. There is no drooling. There is a slight head dyskinesia. This is intermittent.  Chest: is clear to auscultation without wheezing, rhonchi or crackles noted.  Heart: sounds are regular and normal without murmurs, rubs or gallops noted.   Abdomen: is soft, non-tender and non-distended with normal bowel sounds appreciated on auscultation.  Extremities: There is no pitting edema in the distal lower extremities bilaterally. Pedal pulses are intact.  Skin: is warm and dry with no trophic changes noted.  Musculoskeletal: exam reveals no obvious joint deformities, tenderness or joint swelling or erythema.  Neurologically:  Mental status: The patient is awake and alert, paying good  attention. He is able to provide the history. He is oriented to: person, place, time/date, situation, day of week, month of year and year. His memory, attention, language and knowledge are intact. There is no aphasia, agnosia, apraxia or anomia. There is a mild degree of bradyphrenia. Speech is mildly hypophonic with no dysarthria noted. Mood is congruent and affect is normal.  Cranial nerves are as described above under HEENT exam.  In addition, shoulder shrug is normal with equal shoulder height noted.  Motor exam: Normal bulk, and strength for age is noted. There is intermittent mild to moderate dyskinesias in the lower extremities, particularly on the right and particularly with activation of his upper extremities. Tone is mildly rigid with absence of cogwheeling. There is overall mild bradykinesia. There is no drift or rebound. There is no resting tremor today.   Romberg is negative. Reflexes are 1+ in the upper extremities and 1+ in the lower extremities. Fine motor skills exam reveals: Finger taps are moderately impaired on the right and mildly impaired on the left. Hand movements are mildly impaired on the right and mildly impaired on the left. RAP (rapid alternating patting) is moderately impaired on the right and mildly impaired on the left. Foot taps are moderately impaired on the right and moderately impaired on the left. Foot agility (in the form of heel stomping) is mildly impaired on the right and mildly impaired on the left.    Cerebellar testing shows no dysmetria or intention tremor on finger to nose testing. Heel to shin is unremarkable bilaterally. There is no truncal or gait ataxia.   Sensory exam is intact to light touch.   Gait, station and balance exam: He stands up from the seated position with no difficulty and needs no assistance. No veering to one side is noted. He is not noted to lean to the side. Posture is mildly stooped. Stance is narrow-based. He walks with good stride length and pace and deliberate appearing arm swing, particularly on the R. He turns in en bloc. Balance is preserved. He has a slight limp on the L.    Assessment and Plan:   In summary, Reyden R Safley is a very pleasant 72 year old male with a history of advanced right-sided predominant Parkinson's disease, who has successfully undergone left-sided DBS placement in August. He has had great results. He does have mild dyskinesias in  his lower extremities, right more so than left. He is doing well from the subjective and objective standpoint. I think we ought to try to reduce his medications slowly. I would like to reduce his Mirapex to one pill 3 times a day as opposed to a  total dose of 4 pills currently. In about 3 months I suggested that he call back and we discussed reducing his levodopa to a total of 3 pills as opposed to 4 pills. We will keep the fourth times a day scheduled but reduce it by alternating one pill with half a pill. He was in agreement. Since he is doing well I would like to see him back in 6 months, sooner if the need arises. I explained to him that we tend to run very low with Azilect samples as so many patients take it and it is expensive for a lot of patients. I did provide him with 2 weeks worth of samples. I also suggested that he register with [facility]. We helped him fill out the form and we will fax it. We will mail him the original form. He did not need refills on the other medications. I again had a long chat with the patient regarding the diagnosis of PD, the prognosis and treatment options. We talked about medical treatments and non-pharmacological approaches and how he has done with DBS. We talked about maintaining a healthy lifestyle in general. I encouraged the patient to eat healthy, exercise daily and keep well hydrated, to keep a scheduled bedtime and wake time routine, to not skip any meals and eat healthy snacks in between meals. I encouraged him to stay active. I answered all his questions today and the patient was in agreement with the above outlined plan. He is advised to call with any interim questions, concerns, problems, up to and refill requests. Most of my 40 minute visit today was spent in counseling and coordination of care, reviewing test results and reviewing medication.

## 2013-05-23 ENCOUNTER — Telehealth: Payer: Self-pay | Admitting: Neurology

## 2013-05-23 NOTE — Telephone Encounter (Signed)
Spoke with patient and he said that he had since talked with Dr Turner(neurosurgeon)has given him a possible remedy for the jerking, will try that and will call back if needed

## 2013-06-06 DIAGNOSIS — H359 Unspecified retinal disorder: Secondary | ICD-10-CM | POA: Diagnosis not present

## 2013-06-06 DIAGNOSIS — H52229 Regular astigmatism, unspecified eye: Secondary | ICD-10-CM | POA: Diagnosis not present

## 2013-06-06 DIAGNOSIS — H524 Presbyopia: Secondary | ICD-10-CM | POA: Diagnosis not present

## 2013-06-27 ENCOUNTER — Telehealth: Payer: Self-pay | Admitting: Neurology

## 2013-06-27 NOTE — Telephone Encounter (Signed)
Due to financial constraints, patient is requesting that we provide him with enough Azilect 1 mg tabs to get through the first of the year, if we have it. I will discuss with Dr. Frances Furbish and will get back to him.

## 2013-06-27 NOTE — Telephone Encounter (Signed)
Patient called requesting 2 boxes of Azilect samples to pick up. Please call.

## 2013-06-27 NOTE — Telephone Encounter (Signed)
I called patient and let him know that Dr. Frances Furbish said there are not enough samples to give him any. We need to leave what we have for new patients. I did see we had a discount coupon that I would be glad to leave for him to pick up. Patient states that it does not work with his insurance. I see that he is due for new Rx. I will let Pharmacy tech. Know. I will call him if drug rep brings more samples.

## 2013-07-03 ENCOUNTER — Telehealth: Payer: Self-pay | Admitting: Neurology

## 2013-07-03 NOTE — Telephone Encounter (Signed)
Calling to see if any samples came in and if he could have some

## 2013-07-07 ENCOUNTER — Other Ambulatory Visit: Payer: Self-pay

## 2013-07-07 MED ORDER — RASAGILINE MESYLATE 1 MG PO TABS: 1.0000 mg | ORAL_TABLET | Freq: Every day | ORAL | Status: DC

## 2013-07-07 MED ORDER — CYCLOBENZAPRINE HCL 10 MG PO TABS: 20.0000 mg | ORAL_TABLET | Freq: Every day | ORAL | Status: DC

## 2013-07-21 ENCOUNTER — Other Ambulatory Visit: Payer: Self-pay

## 2013-07-21 MED ORDER — CARBIDOPA-LEVODOPA 25-100 MG PO TABS: ORAL_TABLET | ORAL | Status: DC

## 2013-07-21 MED ORDER — PRAMIPEXOLE DIHYDROCHLORIDE 1 MG PO TABS: ORAL_TABLET | ORAL | Status: DC

## 2013-07-26 DIAGNOSIS — G20 Parkinson's disease: Secondary | ICD-10-CM | POA: Diagnosis not present

## 2013-08-07 ENCOUNTER — Telehealth: Payer: Self-pay | Admitting: Internal Medicine

## 2013-08-07 NOTE — Telephone Encounter (Signed)
Pt called and would like a return call regarding a "problem with his lungs".  Did not want to schedule an appointment in office due to sick patients.  Best number to reach patient, [phone] / lt

## 2013-08-07 NOTE — Telephone Encounter (Signed)
Spoke with pt he was prescribed levaquin 2-3 weeks ago, made appt to be seen tomorrow

## 2013-08-08 ENCOUNTER — Ambulatory Visit: Payer: Medicare Other | Admitting: Internal Medicine

## 2013-08-09 ENCOUNTER — Telehealth: Payer: Self-pay | Admitting: Internal Medicine

## 2013-08-09 ENCOUNTER — Ambulatory Visit (INDEPENDENT_AMBULATORY_CARE_PROVIDER_SITE_OTHER): Payer: Medicare Other | Admitting: Internal Medicine

## 2013-08-09 ENCOUNTER — Encounter: Payer: Self-pay | Admitting: Internal Medicine

## 2013-08-09 VITALS — BP 118/60 | HR 74 | Temp 97.7°F | Wt 187.0 lb

## 2013-08-09 DIAGNOSIS — J209 Acute bronchitis, unspecified: Secondary | ICD-10-CM | POA: Diagnosis not present

## 2013-08-09 NOTE — Telephone Encounter (Signed)
Confirmed that she could manage his DBS as well as the medications He will get back with me if he wants a referral

## 2013-08-09 NOTE — Progress Notes (Signed)
Subjective:    Patient ID: Casey Tucker, male    DOB: 1941/05/21, 73 y.o.   MRN: [id number]  HPI Had been sick 3 weeks ago---took levaquin for a week and felt better Then got sick again 3-4 days ago--restarted levaquin (he had left over) Did finally start feeling better this AM  Now coughing up white frothy sputum Seems to be in chest and head  No fever No SOB No ear pain or sore throat No PND  Has tried guaifenisin-- seems to have helped  Current Outpatient Prescriptions on File Prior to Visit  Medication Sig Dispense Refill  . ALPRAZolam (XANAX) 0.5 MG tablet       . carbidopa-levodopa (SINEMET IR) 25-100 MG per tablet Take one tablet po QID 5 days per week and take one tablet six times daily 2 days per week  135 tablet  4  . clonazePAM (KLONOPIN) 0.5 MG tablet Take 2 tablets (1 mg total) by mouth every evening.  60 tablet  2  . cyclobenzaprine (FLEXERIL) 10 MG tablet Take 2 tablets (20 mg total) by mouth at bedtime.  60 tablet  5  . pramipexole (MIRAPEX) 1 MG tablet Take 1 & 1/2 at 7:30AM, 1 at 1:30PM and 1 & 1/2 at 6:30PM  120 tablet  4  . rasagiline (AZILECT) 1 MG TABS tablet Take 1 tablet (1 mg total) by mouth daily.  30 tablet  5   No current facility-administered medications on file prior to visit.    Allergies  Allergen Reactions  . Amoxicillin   . Celebrex [Celecoxib]   . Demerol Other (See Comments)    Hallucinations   . Penicillins     REACTION: RASH    Past Medical History  Diagnosis Date  . GERD (gastroesophageal reflux disease) Pre 2002    Mild  . Ruptured appendix teens  . History of kidney stones   . 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 Parkinson disease 2004    Slowly progressive  . Arthritis     left hip     Past Surgical History  Procedure Laterality Date  . Neuro evaluation  12/19/2004    [name], benign tremor  . Doppler echocardiography  01/10/2009     LV NML Mild LVH EF 60-65% aortic sclerosis w/0 stenosis   . Appendectomy    . Joint replacement      left hip replacement 08/14/11/[place]   . Total hip revision  08/27/2011    Procedure: TOTAL HIP REVISION;  Surgeon: Mauri Pole, MD;  Location: [facility];  Service: Orthopedics;  Laterality: Left;    Family History  Problem Relation Age of Onset  . Arthritis Mother     knee replacement  . COPD Father     emphysema, smoker  . Heart disease Father     CHF  . Alcohol abuse Paternal Uncle     History   Social History  . Marital Status: Married    Spouse Name: N/A    Number of Children: 2  . Years of Education: N/A   Occupational History  . Optometrist     medically retired   Social History Main Topics  . Smoking status: Never Smoker   . Smokeless tobacco: Never Used  . Alcohol Use: 3.5 oz/week    7 drink(s) per week     Comment: occassionally  . Drug Use: No  . Sexual Activity: Yes  Other Topics Concern  . Not on file   Social History Narrative   Married, lives with wife   2 daughters   Has living will   Wife has health care POA---then daughters   Would still accept CPR--but no prolonged artificial means (ventilator or tube feeds)   Review of Systems No rash No vomiting or diarrhea     Objective:   Physical Exam  Constitutional: He appears well-developed and well-nourished. No distress.  HENT:  Mouth/Throat: Oropharynx is clear and moist. No oropharyngeal exudate.  No sinus tenderness TMs normal Mild nasal congestion  Neck: Normal range of motion. Neck supple.  Pulmonary/Chest: Effort normal. No respiratory distress. He has no wheezes. He has no rales.  Decreased breath sounds Slight rhonchi  Lymphadenopathy:    He has no cervical adenopathy.          Assessment & Plan:

## 2013-08-09 NOTE — Assessment & Plan Note (Signed)
Might be viral Finally feeling better Is going to finish out the levaquin he started Discussed supportive Rx

## 2013-08-09 NOTE — Telephone Encounter (Signed)
Pt would like for you to contact him.  He would like to talk to you & get your opinion of him possibly seeing Dr. Carles Collet. Thank you.

## 2013-08-09 NOTE — Progress Notes (Signed)
Pre-visit discussion using our clinic review tool. No additional management support is needed unless otherwise documented below in the visit note.  

## 2013-08-09 NOTE — Patient Instructions (Signed)

## 2013-08-17 ENCOUNTER — Other Ambulatory Visit: Payer: Self-pay

## 2013-08-17 MED ORDER — CLONAZEPAM 0.5 MG PO TABS: 1.0000 mg | ORAL_TABLET | Freq: Every evening | ORAL | Status: DC

## 2013-08-18 NOTE — Telephone Encounter (Signed)
Rx signed and faxed.

## 2013-08-22 DIAGNOSIS — L570 Actinic keratosis: Secondary | ICD-10-CM | POA: Diagnosis not present

## 2013-08-22 DIAGNOSIS — Z85828 Personal history of other malignant neoplasm of skin: Secondary | ICD-10-CM | POA: Diagnosis not present

## 2013-10-11 DIAGNOSIS — G20 Parkinson's disease: Secondary | ICD-10-CM | POA: Diagnosis not present

## 2013-10-12 ENCOUNTER — Telehealth: Payer: Self-pay | Admitting: Neurology

## 2013-10-12 DIAGNOSIS — G20 Parkinson's disease: Secondary | ICD-10-CM

## 2013-10-12 NOTE — Telephone Encounter (Signed)
Please ask patient to reduce his carbidopa-levodopa, which is generic for Sinemet. Pls ask him to reduce the medication to one pill 3 times a day such as at 8 AM, 12 and 4 PM. Remind patient to take the medication an hour before eating. We can keep the schedule until his next visit in May or in about a month we can try to reduce his Mirapex. Please ask them to call back in one month.

## 2013-10-12 NOTE — Telephone Encounter (Signed)
Message copied by [name] on Thu Oct 12, 2013  5:32 PM ------      Message from: Gerre Pebbles R      Created: Thu Oct 12, 2013 11:12 AM       Dr. Darolyn Rua called the office today and would like for you to call him back.       [phone] or [phone]            Thanks,      Ebony Hail ------

## 2013-10-12 NOTE — Telephone Encounter (Signed)
Pt called would like for Dr. Rexene Alberts to call him back concerning his medication carbidopa-levodopa (SINEMET IR) 25-100 MG per tablet & pramipexole (MIRAPEX) 1 MG tablet pt states he has discussed about decreasing these medications. Please call pt back concerning this matter. Thanks

## 2013-10-12 NOTE — Telephone Encounter (Signed)
Spoke with patient and he said that his surgeon (Dr Radford Pax) thought that they should consider since he has the deep brain stimulator now

## 2013-10-12 NOTE — Telephone Encounter (Signed)
Doing well on the DBS Now needs to start titrating down meds Wants to establish with Dr Tat for ongoing care

## 2013-10-13 ENCOUNTER — Encounter: Payer: Self-pay | Admitting: Neurology

## 2013-11-10 ENCOUNTER — Encounter: Payer: Self-pay | Admitting: Neurology

## 2013-11-10 ENCOUNTER — Ambulatory Visit (INDEPENDENT_AMBULATORY_CARE_PROVIDER_SITE_OTHER): Payer: Medicare Other | Admitting: Neurology

## 2013-11-10 VITALS — BP 148/70 | HR 79 | Wt 185.5 lb

## 2013-11-10 DIAGNOSIS — G20 Parkinson's disease: Secondary | ICD-10-CM | POA: Diagnosis not present

## 2013-11-10 DIAGNOSIS — R279 Unspecified lack of coordination: Secondary | ICD-10-CM

## 2013-11-10 DIAGNOSIS — G249 Dystonia, unspecified: Secondary | ICD-10-CM

## 2013-11-10 NOTE — Procedures (Signed)
DBS Programming was performed.    Total time spent programming was 90 minutes.  Device was confirmed to be on.  Soft start was confirmed to be on.  Pt had both A and B settings previously.   Impedences were checked and were within normal limits.  Battery was checked and was determined to be functioning normally and not near the end of life.  Final settings were as follows:  Group A:  Left brain electrode:     2-C+           ; Amplitude  3.3   V (dyskinesias present at 3.5 and turned down)  ; Pulse width 60 microseconds;   Frequency   130   Hz.  Right brain electrode:     n/a         Group B:   Left brain electrode:  1-2-C+ Amplitude   2.8  V ;  Pulse width 90  microseconds;  Frequency   180    Hz.  Right brain electrode: n/a

## 2013-11-10 NOTE — Progress Notes (Signed)
Casey Tucker was seen today in the movement disorders clinic for neurologic consultation at the request of [name], MD.  The consultation is for the evaluation of PD.  The records that were made available to me were reviewed.  Pt is accompanied by his wife who supplements the history.   Pt reports first sx was in 2006.  Pt is R hand dominant.  Pt states that his next door neighbor (Network engineer) noted that he wasn't swinging his arm and thought that he had a stroke. He subsequently saw [name] in about 2006 and he was dx with PD.  He was started on mirapex and azilect.  It helped the overall stiffness.  Tremor developed in the R arm around the same time as the diagnosis.  Levodopa was started in approximately 2009-2010.  He also started an exercise program and he believes that has made a huge difference.  He began to have some motor fluctuations and saw [name] and had his surgery done in 02/2013.  It was a unilateral implant, placed into the L STN.  He has noted that he is sleeping much better since surgery.  No tremor.  [name] programmed the DBS one week post op.  He f/u with the NP at [place], and he now has an A and a B setting.  He hates the A setting (doesn't walk well) and he "cranked" his own B setting up to 280 (assuming frequency) and this helped walking some but he still has problems with balance and freezing.  He is on mirapex, 1 mg tid.  He is on carbidopa/levodopa 25/100, 7:30am/11am/2pm/6pm.    Specific Symptoms:  Tremor: yes, R arm Voice: no voice Sleep: sleeps well post DBS (did not prior to DBS, woke up hourly with pain and cramping previously)  Vivid Dreams:  no  Acting out dreams:  no Wet Pillows: no Postural symptoms:  no but still gets freezing going through small spaces and early in the AM  Falls?  no Bradykinesia symptoms: difficulty getting out of a chair Loss of smell:  no Loss of taste:  no Urinary Incontinence:  no Difficulty Swallowing:   no Handwriting, micrographia: yes Trouble with ADL's:  Some prior to dbs, but not now  Trouble buttoning clothing: yes Depression:  no Memory changes:  no Hallucinations:  no  visual distortions: no N/V:  no Lightheaded:  no  Syncope: no Diplopia:  no Dyskinesia:  yes (only if self turns up DBS too high)   PREVIOUS MEDICATIONS: Sinemet, Mirapex and azilect  ALLERGIES:   Allergies  Allergen Reactions  . Amoxicillin   . Celebrex [Celecoxib]   . Demerol Other (See Comments)    Hallucinations   . Penicillins     REACTION: RASH    CURRENT MEDICATIONS:  Current Outpatient Prescriptions on File Prior to Visit  Medication Sig Dispense Refill  . carbidopa-levodopa (SINEMET IR) 25-100 MG per tablet Take one tablet po QID 5 days per week and take one tablet six times daily 2 days per week  135 tablet  4  . clonazePAM (KLONOPIN) 0.5 MG tablet Take 2 tablets (1 mg total) by mouth every evening.  60 tablet  3  . cyclobenzaprine (FLEXERIL) 10 MG tablet Take 2 tablets (20 mg total) by mouth at bedtime.  60 tablet  5  . pramipexole (MIRAPEX) 1 MG tablet Take 1 & 1/2 at 7:30AM, 1 at 1:30PM and 1 & 1/2 at 6:30PM  120 tablet  4  .  rasagiline (AZILECT) 1 MG TABS tablet Take 1 tablet (1 mg total) by mouth daily.  30 tablet  5   No current facility-administered medications on file prior to visit.    PAST MEDICAL HISTORY:   Past Medical History  Diagnosis Date  . GERD (gastroesophageal reflux disease) Pre 2002    Mild  . Ruptured appendix teens  . History of kidney stones   . 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 Parkinson disease 2004    Slowly progressive  . Arthritis     left hip     PAST SURGICAL HISTORY:   Past Surgical History  Procedure Laterality Date  . Neuro evaluation  12/19/2004    [name], benign tremor  . Doppler echocardiography  01/10/2009    LV NML Mild LVH EF 60-65% aortic  sclerosis w/0 stenosis   . Appendectomy    . Joint replacement      left hip replacement 08/14/11/[place]   . Total hip revision  08/27/2011    Procedure: TOTAL HIP REVISION;  Surgeon: Mauri Pole, MD;  Location: [facility];  Service: Orthopedics;  Laterality: Left;  . Deep brain stimulator placement  8/14    L STN    SOCIAL HISTORY:   History   Social History  . Marital Status: Married    Spouse Name: N/A    Number of Children: 2  . Years of Education: N/A   Occupational History  . Optometrist     medically retired   Social History Main Topics  . Smoking status: Never Smoker   . Smokeless tobacco: Never Used  . Alcohol Use: 3.5 oz/week    7 drink(s) per week     Comment: occassionally  . Drug Use: No  . Sexual Activity: Yes   Other Topics Concern  . Not on file   Social History Narrative   Married, lives with wife   2 daughters   Has living will   Wife has health care POA---then daughters   Would still accept CPR--but no prolonged artificial means (ventilator or tube feeds)    FAMILY HISTORY:   Family Status  Relation Status Death Age  . Mother Deceased 9    knee replacement  . Father Deceased 34    tremor  . Sister Engineer, mining in [place], [place]    ROS:  A complete 10 system review of systems was obtained and was unremarkable apart from what is mentioned above.  PHYSICAL EXAMINATION:    VITALS:   Filed Vitals:   11/10/13 1054  BP: 148/70  Pulse: 79  Weight: 185 lb 8 oz (84.142 kg)  SpO2: 96%    GEN:  The patient appears stated age and is in NAD. HEENT:  Normocephalic, atraumatic.  The mucous membranes are moist. The superficial temporal arteries are without ropiness or tenderness. CV:  RRR Lungs:  CTAB Neck/HEME:  There are no carotid bruits bilaterally.  Neurological examination:  Orientation: The patient is alert and oriented x3. Fund of knowledge is appropriate.  Recent and remote memory are intact.  Attention and concentration are  normal.    Able to name objects and repeat phrases. Cranial nerves: There is good facial symmetry.  There is minimal facial hypomimia.  Pupils are equal round and reactive to light bilaterally. Fundoscopic exam reveals clear margins bilaterally. Extraocular muscles are intact. The visual fields are full to confrontational testing. The speech  is slightly dysarthric but fluent. Soft palate rises symmetrically and there is no tongue deviation. Hearing is intact to conversational tone. Sensation: Sensation is intact to light and pinprick throughout (facial, trunk, extremities). Vibration is intact at the bilateral big toe. There is no extinction with double simultaneous stimulation. There is no sensory dermatomal level identified. Motor: Strength is 5/5 in the bilateral upper and lower extremities.   Shoulder shrug is equal and symmetric.  There is no pronator drift. Deep tendon reflexes: Deep tendon reflexes are 2-/4 at the bilateral biceps, triceps, brachioradialis, patella and trace at the bilateral achilles. Plantar responses are downgoing bilaterally.  Movement examination: Tone: There is normal tone in the bilateral upper extremities.  The tone in the lower extremities is normal.  Abnormal movements: There is some dyskinesia on the left.  No tremor. Coordination:  There is no significant decremation with RAM's Gait and Station: The patient has no difficulty arising out of a deep-seated chair without the use of the hands. The patient's stride length is normal, but when asked to walk down the hall, he actually runs.  I asked him several times to just show me a walk, but he generally ran. The patient has a negative pull test.      DBS programming was performed today which is described in more detail on a separate programming procedural notes.  This was a lengthy procedure.  In brief, there was initially a significant increase in the dyskinesia on the right with programming.  I turned the voltage down.   There was still some dyskinesia, but the patient had just taken his medication.  I had him walk, and there was some dyskinesia in both on the right and left with ambulation.  However, the patient reported ambulation was much easier.  ASSESSMENT/PLAN:  1.  Idiopathic Parkinson's disease.    -The patient is status post left STN DBS at [facility] on 03/14/2013.  -I reprogrammed the device today, hopefully giving him more optimal settings.  I do think that he is going to have some dyskinesia on the right following programming today, and likely need to decrease the levodopa.  I told the patient that if he became dyskinetic to decrease the levodopa from 4 tablets daily to 2 per day.    -I did leave setting "B" just in case he felt like he needed to default back to it but generally I would like him not to change the settings and restricted his programmer more today.  -I. did talk to the patient about the fact that sometimes, it is difficult to treat patients who have had unilateral DBS, given the fact that the DBS only treats one side.  In the past, however, he really only has had one-sided symptoms, so hopefully this will not be so challenging.  -He is exercising and I encouraged him to continue to do so.  -For now, he will remain on the Mirapex, 1 mg 3 times a day as well as the Azilect.  -Depending on the success of our programming, we may need to do a specialized MRI just to look at the contacts.  He can no longer have a normal MRI again. 2.  I will plan to see him back in the next week or 2 to work on his programming further.

## 2013-11-10 NOTE — Patient Instructions (Addendum)
1. Hold your Carbidopa Levodopa and Mirapex the morning of 11/15/13. 2. We will see you on 11/15/13 at 10:45 am for DBS programming.

## 2013-11-15 ENCOUNTER — Ambulatory Visit (INDEPENDENT_AMBULATORY_CARE_PROVIDER_SITE_OTHER): Payer: Medicare Other | Admitting: Neurology

## 2013-11-15 ENCOUNTER — Encounter: Payer: Self-pay | Admitting: Neurology

## 2013-11-15 VITALS — BP 144/60 | HR 84 | Resp 18 | Wt 185.0 lb

## 2013-11-15 DIAGNOSIS — G20 Parkinson's disease: Secondary | ICD-10-CM

## 2013-11-15 MED ORDER — RASAGILINE MESYLATE 1 MG PO TABS: 1.0000 mg | ORAL_TABLET | Freq: Every day | ORAL | Status: DC

## 2013-11-15 NOTE — Progress Notes (Signed)
Casey Tucker was seen today in the movement disorders clinic for neurologic consultation at the request of [name], MD.  The consultation is for the evaluation of PD.  The records that were made available to me were reviewed.  Pt is accompanied by his wife who supplements the history.   Pt reports first sx was in 2006.  Pt is R hand dominant.  Pt states that his next door neighbor (Network engineer) noted that he wasn't swinging his arm and thought that he had a stroke. He subsequently saw [name] in about 2006 and he was dx with PD.  He was started on mirapex and azilect.  It helped the overall stiffness.  Tremor developed in the R arm around the same time as the diagnosis.  Levodopa was started in approximately 2009-2010.  He also started an exercise program and he believes that has made a huge difference.  He began to have some motor fluctuations and saw [name] and had his surgery done in 02/2013.  It was a unilateral implant, placed into the L STN.  He has noted that he is sleeping much better since surgery.  No tremor.  [name] programmed the DBS one week post op.  He f/u with the NP at [place], and he now has an A and a B setting.  He hates the A setting (doesn't walk well) and he "cranked" his own B setting up to 280 (assuming frequency) and this helped walking some but he still has problems with balance and freezing.  He is on mirapex, 1 mg tid.  He is on carbidopa/levodopa 25/100, 7:30am/11am/2pm/6pm.    11/15/13:  Pt returns today for follow up.  Overall, he has been doing well.  He is off of medication this AM for programming.  His wife reports that he has done better on his new setting.  Seems to be walking better with less dyskinesia.  Until this morning, he was still on the carbidopa/levodopa 25/100, 1 tablet 4 times per day, Azilect 1 mg daily and Mirapex 1 mg 3 times per day.  He is exercising faithfully.  He has not had any falls.  He has been shuffling a little more this morning,  since he is off of medication.  PREVIOUS MEDICATIONS: Sinemet, Mirapex and azilect  ALLERGIES:   Allergies  Allergen Reactions  . Amoxicillin   . Celebrex [Celecoxib]   . Demerol Other (See Comments)    Hallucinations   . Penicillins     REACTION: RASH    CURRENT MEDICATIONS:  Current Outpatient Prescriptions on File Prior to Visit  Medication Sig Dispense Refill  . carbidopa-levodopa (SINEMET IR) 25-100 MG per tablet Take one tablet po QID 5 days per week and take one tablet six times daily 2 days per week  135 tablet  4  . clonazePAM (KLONOPIN) 0.5 MG tablet Take 2 tablets (1 mg total) by mouth every evening.  60 tablet  3  . cyclobenzaprine (FLEXERIL) 10 MG tablet Take 2 tablets (20 mg total) by mouth at bedtime.  60 tablet  5  . pramipexole (MIRAPEX) 1 MG tablet Take 1 & 1/2 at 7:30AM, 1 at 1:30PM and 1 & 1/2 at 6:30PM  120 tablet  4  . rasagiline (AZILECT) 1 MG TABS tablet Take 1 tablet (1 mg total) by mouth daily.  30 tablet  5  . senna (SENOKOT) 8.6 MG tablet Take 1 tablet by mouth daily.       No current  facility-administered medications on file prior to visit.    PAST MEDICAL HISTORY:   Past Medical History  Diagnosis Date  . GERD (gastroesophageal reflux disease) Pre 2002    Mild  . Ruptured appendix teens  . History of kidney stones   . 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 Parkinson disease 2004    Slowly progressive  . Arthritis     left hip     PAST SURGICAL HISTORY:   Past Surgical History  Procedure Laterality Date  . Neuro evaluation  12/19/2004    [name], benign tremor  . Doppler echocardiography  01/10/2009    LV NML Mild LVH EF 60-65% aortic sclerosis w/0 stenosis   . Appendectomy    . Joint replacement      left hip replacement 08/14/11/[place]   . Total hip revision  08/27/2011    Procedure: TOTAL HIP REVISION;  Surgeon: Mauri Pole, MD;  Location: [facility];   Service: Orthopedics;  Laterality: Left;  . Deep brain stimulator placement  8/14    L STN    SOCIAL HISTORY:   History   Social History  . Marital Status: Married    Spouse Name: N/A    Number of Children: 2  . Years of Education: N/A   Occupational History  . Optometrist     medically retired   Social History Main Topics  . Smoking status: Never Smoker   . Smokeless tobacco: Never Used  . Alcohol Use: 3.5 oz/week    7 drink(s) per week     Comment: occassionally  . Drug Use: No  . Sexual Activity: Yes   Other Topics Concern  . Not on file   Social History Narrative   Married, lives with wife   2 daughters   Has living will   Wife has health care POA---then daughters   Would still accept CPR--but no prolonged artificial means (ventilator or tube feeds)    FAMILY HISTORY:   Family Status  Relation Status Death Age  . Mother Deceased 45    knee replacement  . Father Deceased 67    tremor  . Sister Engineer, mining in [place], [place]    ROS:  A complete 10 system review of systems was obtained and was unremarkable apart from what is mentioned above.  PHYSICAL EXAMINATION:    VITALS:   Filed Vitals:   11/15/13 1018  BP: 144/60  Pulse: 84  Resp: 18  Weight: 185 lb (83.915 kg)    GEN:  The patient appears stated age and is in NAD. HEENT:  Normocephalic, atraumatic.  The mucous membranes are moist. The superficial temporal arteries are without ropiness or tenderness. CV:  RRR Lungs:  CTAB Neck/HEME:  There are no carotid bruits bilaterally.  Neurological examination:  Orientation: The patient is alert and oriented x3. Fund of knowledge is appropriate.  Recent and remote memory are intact.  Attention and concentration are normal.    Able to name objects and repeat phrases. Cranial nerves: There is good facial symmetry.  There is minimal facial hypomimia.  Pupils are equal round and reactive to light bilaterally. Fundoscopic exam reveals clear margins  bilaterally. Extraocular muscles are intact. The visual fields are full to confrontational testing. The speech is slightly dysarthric but fluent. Soft palate rises symmetrically and there is no tongue deviation. Hearing is intact to conversational tone. Motor: Strength is 5/5 in  the bilateral upper and lower extremities.   Shoulder shrug is equal and symmetric.  There is no pronator drift.  Movement examination: Tone: There is normal tone in the bilateral upper extremities.  The tone in the lower extremities is normal.  Abnormal movements: There is no dyskinesia today. Coordination:  There is no significant decremation with RAM's Gait and Station: The patient has no difficulty arising out of a deep-seated chair without the use of the hands even off medication. The patient does have some start hesitation, with the right foot sticking to the floor.  Following programming, this is improved somewhat.    DBS programming was performed today which is described in more detail on a separate programming procedural notes.  In brief, there was improvement in ambulation.  ASSESSMENT/PLAN:  1.  Idiopathic Parkinson's disease.    -The patient is status post left STN DBS at [facility] on 03/14/2013.  -I reprogrammed the device today while he was off of medication.  I asked him to drop his carbidopa/levodopa from 4 times per day to 3 times per day.  -For now, he will remain on the Mirapex, 1 mg 3 times per day as well as Azilect, 1 mg daily.  He was given samples of the Azilect.  -I did leave setting "B" just in case he felt like he needed to default back to it but generally I would like him not to change the settings and restricted his programmer more today.  -I. did talk to the patient about the fact that sometimes, it is difficult to treat patients who have had unilateral DBS, given the fact that the DBS only treats one side.  In the past, however, he really only has had one-sided symptoms, so hopefully this will not  be so challenging.  -He is exercising and I encouraged him to continue to do so. 2.  I will plan to see him back in the next month

## 2013-11-15 NOTE — Procedures (Signed)
DBS Programming was performed.    Total time spent programming was 40 minutes.  Device was confirmed to be on.  Soft start was confirmed to be on.  Impedences were checked and were within normal limits.  Battery was checked and was determined to be functioning normally and not near the end of life.  Final settings were as follows:  Left brain electrode, Group A:     2-C+           ; Amplitude  3.6   V   ; Pulse width 60 microseconds;   Frequency   130   Hz.  Left brain electrode, group B (setting given by [facility] but not currently being used):  1-2-C+, amplitude: 2.8V, PW: 90, frequency 180  Right brain electrode:     n/a

## 2013-11-15 NOTE — Patient Instructions (Signed)
1. Stop taking your last dose of the day of Carbidopa Levodopa. 2. Given samples of Azilect.  3. Follow up in 1 month.

## 2013-11-20 ENCOUNTER — Telehealth: Payer: Self-pay | Admitting: Neurology

## 2013-11-20 NOTE — Telephone Encounter (Signed)
Patient aware to take his Carbidopa Levodopa that he had dropped with the changing of the settings and keep appt Thursday.

## 2013-11-20 NOTE — Telephone Encounter (Signed)
Spoke with patient and he states he is really having a hard time with movement with his new setting. He states once the meds start to tapper off he can not move well. Please advise.

## 2013-11-20 NOTE — Telephone Encounter (Signed)
Pt called requesting to speak to a nurse. Please call pt at his cell [phone]

## 2013-11-20 NOTE — Telephone Encounter (Signed)
Appt made and patient made aware.

## 2013-11-20 NOTE — Telephone Encounter (Signed)
I only increased him last visit, so that is odd.  Put him in thurs AM 8:15-9:15 and I will work with his device.

## 2013-11-21 ENCOUNTER — Encounter: Payer: Self-pay | Admitting: Neurology

## 2013-11-21 ENCOUNTER — Ambulatory Visit (INDEPENDENT_AMBULATORY_CARE_PROVIDER_SITE_OTHER): Payer: Medicare Other | Admitting: Neurology

## 2013-11-21 ENCOUNTER — Ambulatory Visit: Payer: Medicare Other | Admitting: Neurology

## 2013-11-21 VITALS — BP 120/60 | HR 77 | Wt 184.0 lb

## 2013-11-21 DIAGNOSIS — G20 Parkinson's disease: Secondary | ICD-10-CM

## 2013-11-21 NOTE — Procedures (Signed)
DBS Programming was performed.    Total time spent programming was 40 minutes.  Device was confirmed to be on.  Soft start was confirmed to be on.  Impedences were checked and were within normal limits.  Battery was checked and was determined to be functioning normally and not near the end of life.  Final settings were as follows:  Left brain electrode, Group A:     2-C+           ; Amplitude  3.5   V   ; Pulse width 60 microseconds;   Frequency   130   Hz.  Left brain electrode, group B:  1-C+, amplitude: 3.3 V, PW: 60, frequency 130  Right brain electrode:     n/a

## 2013-11-21 NOTE — Progress Notes (Signed)
Casey Tucker was seen today in the movement disorders clinic for neurologic consultation at the request of [name], MD.  The consultation is for the evaluation of PD.  The records that were made available to me were reviewed.  Pt is accompanied by his wife who supplements the history.   Pt reports first sx was in 2006.  Pt is R hand dominant.  Pt states that his next door neighbor (Network engineer) noted that he wasn't swinging his arm and thought that he had a stroke. He subsequently saw [name] in about 2006 and he was dx with PD.  He was started on mirapex and azilect.  It helped the overall stiffness.  Tremor developed in the R arm around the same time as the diagnosis.  Levodopa was started in approximately 2009-2010.  He also started an exercise program and he believes that has made a huge difference.  He began to have some motor fluctuations and saw [name] and had his surgery done in 02/2013.  It was a unilateral implant, placed into the L STN.  He has noted that he is sleeping much better since surgery.  No tremor.  [name] programmed the DBS one week post op.  He f/u with the NP at [facility], and he now has an A and a B setting.  He hates the A setting (doesn't walk well) and he "cranked" his own B setting up to 280 (assuming frequency) and this helped walking some but he still has problems with balance and freezing.  He is on mirapex, 1 mg tid.  He is on carbidopa/levodopa 25/100, 7:30am/11am/2pm/6pm.    11/15/13:  Pt returns today for follow up.  Overall, he has been doing well.  He is off of medication this AM for programming.  His wife reports that he has done better on his new setting.  Seems to be walking better with less dyskinesia.  Until this morning, he was still on the carbidopa/levodopa 25/100, 1 tablet 4 times per day, Azilect 1 mg daily and Mirapex 1 mg 3 times per day.  He is exercising faithfully.  He has not had any falls.  He has been shuffling a little more this morning,  since he is off of medication.  11/21/13 update:  The patient is returning for followup, earlier than expected.  The patient reports that he really had not been doing as well over the weekend.  He did not feel like he was walking as well. He felt like the setting "wore" off 30 mins before the next levodopa dosage.  His wife wonders if he wasn't doing as well because he was at the beach this past weekend and there were more stairs to negotiate.  No falls.  No hallucinations   He did decrease his carbidopa/levodopa 25/100, one tablet 3 times per day from one tablet 4 times per day.  He is still on Mirapex 1 mg 3 times per day and Azilect 1 mg daily.  PREVIOUS MEDICATIONS: Sinemet, Mirapex and azilect  ALLERGIES:   Allergies  Allergen Reactions  . Amoxicillin   . Celebrex [Celecoxib]   . Demerol Other (See Comments)    Hallucinations   . Penicillins     REACTION: RASH    CURRENT MEDICATIONS:  Current Outpatient Prescriptions on File Prior to Visit  Medication Sig Dispense Refill  . carbidopa-levodopa (SINEMET IR) 25-100 MG per tablet Take one tablet po QID 5 days per week and take one tablet six times  daily 2 days per week  135 tablet  4  . clonazePAM (KLONOPIN) 0.5 MG tablet Take 2 tablets (1 mg total) by mouth every evening.  60 tablet  3  . cyclobenzaprine (FLEXERIL) 10 MG tablet Take 2 tablets (20 mg total) by mouth at bedtime.  60 tablet  5  . pramipexole (MIRAPEX) 1 MG tablet Take 1 & 1/2 at 7:30AM, 1 at 1:30PM and 1 & 1/2 at 6:30PM  120 tablet  4  . rasagiline (AZILECT) 1 MG TABS tablet Take 1 tablet (1 mg total) by mouth daily.  30 tablet  5  . rasagiline (AZILECT) 1 MG TABS tablet Take 1 tablet (1 mg total) by mouth daily.  28 tablet  0  . senna (SENOKOT) 8.6 MG tablet Take 1 tablet by mouth daily.       No current facility-administered medications on file prior to visit.    PAST MEDICAL HISTORY:   Past Medical History  Diagnosis Date  . GERD (gastroesophageal reflux disease)  Pre 2002    Mild  . Ruptured appendix teens  . History of kidney stones   . 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 Parkinson disease 2004    Slowly progressive  . Arthritis     left hip     PAST SURGICAL HISTORY:   Past Surgical History  Procedure Laterality Date  . Neuro evaluation  12/19/2004    [name], benign tremor  . Doppler echocardiography  01/10/2009    LV NML Mild LVH EF 60-65% aortic sclerosis w/0 stenosis   . Appendectomy    . Joint replacement      left hip replacement 08/14/11/[place]   . Total hip revision  08/27/2011    Procedure: TOTAL HIP REVISION;  Surgeon: Mauri Pole, MD;  Location: [facility];  Service: Orthopedics;  Laterality: Left;  . Deep brain stimulator placement  8/14    L STN    SOCIAL HISTORY:   History   Social History  . Marital Status: Married    Spouse Name: N/A    Number of Children: 2  . Years of Education: N/A   Occupational History  . Optometrist     medically retired   Social History Main Topics  . Smoking status: Never Smoker   . Smokeless tobacco: Never Used  . Alcohol Use: 3.5 oz/week    7 drink(s) per week     Comment: occassionally  . Drug Use: No  . Sexual Activity: Yes   Other Topics Concern  . Not on file   Social History Narrative   Married, lives with wife   2 daughters   Has living will   Wife has health care POA---then daughters   Would still accept CPR--but no prolonged artificial means (ventilator or tube feeds)    FAMILY HISTORY:   Family Status  Relation Status Death Age  . Mother Deceased 52    knee replacement  . Father Deceased 70    tremor  . Sister Engineer, mining in [place], [place]    ROS:  A complete 10 system review of systems was obtained and was unremarkable apart from what is mentioned above.  PHYSICAL EXAMINATION:    VITALS:   Filed Vitals:   11/21/13 1230  BP: 120/60  Pulse: 77  Weight:  184 lb (83.462 kg)  SpO2: 92%    GEN:  The patient appears stated age  and is in NAD. HEENT:  Normocephalic, atraumatic.  The mucous membranes are moist. The superficial temporal arteries are without ropiness or tenderness. CV:  RRR Lungs:  CTAB Neck/HEME:  There are no carotid bruits bilaterally.  Neurological examination:  Orientation: The patient is alert and oriented x3. Fund of knowledge is appropriate.  Recent and remote memory are intact.  Attention and concentration are normal.    Able to name objects and repeat phrases. Cranial nerves: There is good facial symmetry.  There is minimal facial hypomimia.  Pupils are equal round and reactive to light bilaterally. Fundoscopic exam reveals clear margins bilaterally. Extraocular muscles are intact. The visual fields are full to confrontational testing. The speech is slightly dysarthric but fluent. Soft palate rises symmetrically and there is no tongue deviation. Hearing is intact to conversational tone. Motor: Strength is 5/5 in the bilateral upper and lower extremities.   Shoulder shrug is equal and symmetric.  There is no pronator drift.  Movement examination: Tone: There is normal tone in the bilateral upper extremities.  The tone in the lower extremities is normal.  Abnormal movements: There is mild dyskinesia today. Coordination:  There is no significant decremation with RAM's Gait and Station: The patient has no difficulty arising out of a deep-seated chair and "runs" down the hall with some dyskinesia.  DBS programming was performed today which is described in more detail on a separate programming procedural notes.  In brief, there was improvement in ambulation.  Pt reported post programming that it was time for medication and that he felt great and felt no need for medication.  ASSESSMENT/PLAN:  1.  Idiopathic Parkinson's disease.    -The patient is status post left STN DBS at [facility] on 03/14/2013.  -I reprogrammed the device today  and left him with an A and B setting.  He really seemed optimally programmed when I saw him.  -For now, he will remain on the Mirapex, 1 mg 3 times per day as well as Azilect, 1 mg daily.  He was given samples of the Azilect.  -I. did talk to the patient about the fact that sometimes, it is difficult to treat patients who have had unilateral DBS, given the fact that the DBS only treats one side.  In the past, however, he really only has had one-sided symptoms, so hopefully this will not be so challenging.  -He is exercising and I encouraged him to continue to do so. 2.  I will plan to see him back in the next month

## 2013-11-23 ENCOUNTER — Telehealth: Payer: Self-pay | Admitting: Neurology

## 2013-11-23 ENCOUNTER — Ambulatory Visit: Payer: Medicare Other | Admitting: Neurology

## 2013-11-23 NOTE — Telephone Encounter (Signed)
Pt called requesting to speak to a nurse. He would like to know if it is okay for him to swim in cold water While having his DVS.

## 2013-11-23 NOTE — Telephone Encounter (Signed)
Is swimming okay with DBS?

## 2013-11-23 NOTE — Telephone Encounter (Signed)
Absolutely!  Hope he is doing well!

## 2013-11-24 NOTE — Telephone Encounter (Signed)
Patient made aware.

## 2013-12-05 DIAGNOSIS — H52229 Regular astigmatism, unspecified eye: Secondary | ICD-10-CM | POA: Diagnosis not present

## 2013-12-05 DIAGNOSIS — H5231 Anisometropia: Secondary | ICD-10-CM | POA: Diagnosis not present

## 2013-12-06 DIAGNOSIS — H6120 Impacted cerumen, unspecified ear: Secondary | ICD-10-CM | POA: Diagnosis not present

## 2013-12-13 DIAGNOSIS — IMO0002 Reserved for concepts with insufficient information to code with codable children: Secondary | ICD-10-CM | POA: Diagnosis not present

## 2013-12-14 ENCOUNTER — Telehealth: Payer: Self-pay | Admitting: Neurology

## 2013-12-14 MED ORDER — RASAGILINE MESYLATE 1 MG PO TABS: 1.0000 mg | ORAL_TABLET | Freq: Every day | ORAL | Status: DC

## 2013-12-14 NOTE — Telephone Encounter (Signed)
Made patient's wife aware that we will give him a few more samples and a copay card to help with copay. They will call as needed.

## 2013-12-14 NOTE — Telephone Encounter (Signed)
Pt wants to know if he can get some samples please call [phone]

## 2013-12-20 ENCOUNTER — Ambulatory Visit (INDEPENDENT_AMBULATORY_CARE_PROVIDER_SITE_OTHER): Payer: Medicare Other | Admitting: Neurology

## 2013-12-20 ENCOUNTER — Encounter: Payer: Self-pay | Admitting: Neurology

## 2013-12-20 VITALS — BP 126/60 | HR 80 | Resp 16 | Ht 73.0 in | Wt 179.0 lb

## 2013-12-20 DIAGNOSIS — G20 Parkinson's disease: Secondary | ICD-10-CM

## 2013-12-20 NOTE — Procedures (Signed)
DBS Programming was performed.    Total time spent programming was 40 minutes.  Device was confirmed to be on.  Soft start was confirmed to be on.  Impedences were checked and were within normal limits.  Battery was checked and was determined to be functioning normally and not near the end of life.  Final settings were as follows:  Left brain electrode, Group A:     2-C+           ; Amplitude  3.5   V   ; Pulse width 60 microseconds;   Frequency   140   Hz.  Left brain electrode, group B:  1-C+, amplitude: 3.4 V, PW: 60, frequency 140  Group B was active at the time pt left the office.  Right brain electrode:     n/a

## 2013-12-20 NOTE — Progress Notes (Signed)
Casey Tucker was seen today in the movement disorders clinic for neurologic consultation at the request of [name], MD.  The consultation is for the evaluation of PD.  The records that were made available to me were reviewed.  Pt is accompanied by his wife who supplements the history.   Pt reports first sx was in 2006.  Pt is R hand dominant.  Pt states that his next door neighbor (Network engineer) noted that he wasn't swinging his arm and thought that he had a stroke. He subsequently saw [name] in about 2006 and he was dx with PD.  He was started on mirapex and azilect.  It helped the overall stiffness.  Tremor developed in the R arm around the same time as the diagnosis.  Levodopa was started in approximately 2009-2010.  He also started an exercise program and he believes that has made a huge difference.  He began to have some motor fluctuations and saw [name] and had his surgery done in 02/2013.  It was a unilateral implant, placed into the L STN.  He has noted that he is sleeping much better since surgery.  No tremor.  [name] programmed the DBS one week post op.  He f/u with the NP at [facility] [facility], and he now has an A and a B setting.  He hates the A setting (doesn't walk well) and he "cranked" his own B setting up to 280 (assuming frequency) and this helped walking some but he still has problems with balance and freezing.  He is on mirapex, 1 mg tid.  He is on carbidopa/levodopa 25/100, 7:30am/11am/2pm/6pm.    11/15/13:  Pt returns today for follow up.  Overall, he has been doing well.  He is off of medication this AM for programming.  His wife reports that he has done better on his new setting.  Seems to be walking better with less dyskinesia.  Until this morning, he was still on the carbidopa/levodopa 25/100, 1 tablet 4 times per day, Azilect 1 mg daily and Mirapex 1 mg 3 times per day.  He is exercising faithfully.  He has not had any falls.  He has been shuffling a little more this morning,  since he is off of medication.  11/21/13 update:  The patient is returning for followup, earlier than expected.  The patient reports that he really had not been doing as well over the weekend.  He did not feel like he was walking as well. He felt like the setting "wore" off 30 mins before the next levodopa dosage.  His wife wonders if he wasn't doing as well because he was at the beach this past weekend and there were more stairs to negotiate.  No falls.  No hallucinations   He did decrease his carbidopa/levodopa 25/100, one tablet 3 times per day from one tablet 4 times per day.  He is still on Mirapex 1 mg 3 times per day and Azilect 1 mg daily.  12/20/13 update:  The patient returns today for followup, accompanied by his wife and daughter who supplements the history.  The patient is currently on Mirapex, 1 mg 3 times a day, Azilect 1 mg daily and carbidopa/levodopa 25/100 at 7:30 AM/11 AM/2 PM and 6:30 PM.  Overall, the patient states that he is doing much better on his current setting.  It is not seeming to "wear off."  He is having some difficulty in the middle of the night when he gets up  and sometimes has to use a cane, but otherwise is doing well.  No falls.  On one occasion, he did try to get up in the morning and go and take a walk and was able to walk a few miles without taking his medication, but then he needed it when he got home.  He has noticed some dyskinesia, but it has not been particularly bothersome.  He would like to decrease dyskinesia, if able.  PREVIOUS MEDICATIONS: Sinemet, Mirapex and azilect  ALLERGIES:   Allergies  Allergen Reactions  . Amoxicillin   . Celebrex [Celecoxib]   . Demerol Other (See Comments)    Hallucinations   . Penicillins     REACTION: RASH    CURRENT MEDICATIONS:  Current Outpatient Prescriptions on File Prior to Visit  Medication Sig Dispense Refill  . clonazePAM (KLONOPIN) 0.5 MG tablet Take 2 tablets (1 mg total) by mouth every evening.  60 tablet  3   . cyclobenzaprine (FLEXERIL) 10 MG tablet Take 2 tablets (20 mg total) by mouth at bedtime.  60 tablet  5  . pramipexole (MIRAPEX) 1 MG tablet Take 1 & 1/2 at 7:30AM, 1 at 1:30PM and 1 & 1/2 at 6:30PM  120 tablet  4  . rasagiline (AZILECT) 1 MG TABS tablet Take 1 tablet (1 mg total) by mouth daily.  30 tablet  5  . senna (SENOKOT) 8.6 MG tablet Take 1 tablet by mouth daily.       No current facility-administered medications on file prior to visit.    PAST MEDICAL HISTORY:   Past Medical History  Diagnosis Date  . GERD (gastroesophageal reflux disease) Pre 2002    Mild  . Ruptured appendix teens  . History of kidney stones   . 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 Parkinson disease 2004    Slowly progressive  . Arthritis     left hip     PAST SURGICAL HISTORY:   Past Surgical History  Procedure Laterality Date  . Neuro evaluation  12/19/2004    [name], benign tremor  . Doppler echocardiography  01/10/2009    LV NML Mild LVH EF 60-65% aortic sclerosis w/0 stenosis   . Appendectomy    . Joint replacement      left hip replacement 08/14/11/[facility]   . Total hip revision  08/27/2011    Procedure: TOTAL HIP REVISION;  Surgeon: Mauri Pole, MD;  Location: [facility];  Service: Orthopedics;  Laterality: Left;  . Deep brain stimulator placement  8/14    L STN    SOCIAL HISTORY:   History   Social History  . Marital Status: Married    Spouse Name: N/A    Number of Children: 2  . Years of Education: N/A   Occupational History  . Optometrist     medically retired   Social History Main Topics  . Smoking status: Never Smoker   . Smokeless tobacco: Never Used  . Alcohol Use: 3.5 oz/week    7 drink(s) per week     Comment: occassionally  . Drug Use: No  . Sexual Activity: Yes   Other Topics Concern  . Not on file   Social History Narrative   Married, lives with wife   2 daughters    Has living will   Wife has health care POA---then daughters   Would still accept CPR--but no prolonged artificial means (ventilator or tube feeds)  FAMILY HISTORY:   Family Status  Relation Status Death Age  . Mother Deceased 25    knee replacement  . Father Deceased 40    tremor  . Sister Engineer, mining in [place], [place]    ROS:  A complete 10 system review of systems was obtained and was unremarkable apart from what is mentioned above.  PHYSICAL EXAMINATION:    VITALS:   Filed Vitals:   12/20/13 1053  BP: 126/60  Pulse: 80  Resp: 16  Height: 6\' 1"  (1.854 m)  Weight: 179 lb (81.194 kg)   Pt seen at 11 am and it was time for levodopa dosing but he had not taken it.    GEN:  The patient appears stated age and is in NAD. HEENT:  Normocephalic, atraumatic.  The mucous membranes are moist. The superficial temporal arteries are without ropiness or tenderness. CV:  RRR Lungs:  CTAB Neck/HEME:  There are no carotid bruits bilaterally.  Neurological examination:  Orientation: The patient is alert and oriented x3. Fund of knowledge is appropriate.  Recent and remote memory are intact.  Attention and concentration are normal.    Able to name objects and repeat phrases. Cranial nerves: There is good facial symmetry.  There is minimal facial hypomimia.  Pupils are equal round and reactive to light bilaterally. Fundoscopic exam reveals clear margins bilaterally. Extraocular muscles are intact. The visual fields are full to confrontational testing. The speech is slightly dysarthric but fluent. Soft palate rises symmetrically and there is no tongue deviation. Hearing is intact to conversational tone. Motor: Strength is 5/5 in the bilateral upper and lower extremities.   Shoulder shrug is equal and symmetric.  There is no pronator drift.  Movement examination: Tone: There is normal tone in the bilateral upper extremities.  The tone in the lower extremities is normal.  Abnormal  movements: There is mild dyskinesia today. Coordination:  There is no significant decremation with RAM's Gait and Station: The patient has no difficulty arising out of a deep-seated chair and "runs" down the hall with some dyskinesia.  DBS programming was performed today which is described in more detail on a separate programming procedural notes.  In brief, I slightly increased the voltage, and hope to decrease the levodopa.  Pt reported post programming that it was time for medication and that he felt great and felt no need for medication.  ASSESSMENT/PLAN:  1.  Idiopathic Parkinson's disease.    -The patient is status post left STN DBS at [facility] on 03/14/2013.  -He will take carbidopa/levodopa as follows: One tablet at 7:30 AM/half tablet at 11 AM/half tablet at 2 PM/one tablet at 6:30 PM.  -For now, he will remain on the Mirapex, 1 mg 3 times per day as well as Azilect, 1 mg daily.  He was given samples of the Azilect.  -I. did talk to the patient about the fact that sometimes, it is difficult to treat patients who have had unilateral DBS, given the fact that the DBS only treats one side.  In the past, however, he really only has had one-sided symptoms, so hopefully this will not be so challenging.  -He is exercising and I encouraged him to continue to do so. 2.  I will plan to see him back in the next few months, sooner should new neurologic issues arise.

## 2013-12-20 NOTE — Patient Instructions (Signed)
1. Decrease Carbidopa Levodopa 25/100 to 1 tablet at 7:30 am/ 1/2 tablet at 11:00 am/ 1/2 tablet at 2:00 pm/ 1 tablet at 6:30 pm.  2. Follow up 3 months.

## 2014-01-02 DIAGNOSIS — M21619 Bunion of unspecified foot: Secondary | ICD-10-CM | POA: Diagnosis not present

## 2014-01-03 ENCOUNTER — Telehealth: Payer: Self-pay | Admitting: Neurology

## 2014-01-03 MED ORDER — CARBIDOPA-LEVODOPA 25-100 MG PO TABS: ORAL_TABLET | ORAL | Status: DC

## 2014-01-03 NOTE — Telephone Encounter (Signed)
Spoke with patient and he gave permission for [place] to give out his number to a patient to be a patient advocate for DBS.

## 2014-01-03 NOTE — Telephone Encounter (Signed)
Carbidopa Levodopa refill requested. Per last office note- patient to remain on medication. Refill approved and sent to patient's pharmacy.   

## 2014-01-10 ENCOUNTER — Telehealth: Payer: Self-pay | Admitting: Internal Medicine

## 2014-01-10 NOTE — Telephone Encounter (Signed)
Pt has appointment on  01/24/14. Pt had surgery last aug for parkinson.  Dr tat his neurologist. In [place] His has appointment in sept with dr tat.  And he wanted to know if  dr Silvio Pate  wants to see him in aug for his cpx or wait till oct after he see dr tat and they finalize his med's. Please advise pt.

## 2014-01-10 NOTE — Telephone Encounter (Signed)
I spoke with patient and he rescheduled his physical to 04/20/14 at 7:45.

## 2014-01-10 NOTE — Telephone Encounter (Signed)
It would probably be good to postpone till after he is set with the DBS Please change the appt

## 2014-01-15 ENCOUNTER — Telehealth: Payer: Self-pay | Admitting: Neurology

## 2014-01-15 MED ORDER — CYCLOBENZAPRINE HCL 10 MG PO TABS: 20.0000 mg | ORAL_TABLET | Freq: Every day | ORAL | Status: DC

## 2014-01-15 NOTE — Telephone Encounter (Signed)
Flexeril refill requested. Per Dr Tat- patient to remain on medication. Refill approved and sent to patient's pharmacy.

## 2014-01-24 ENCOUNTER — Encounter: Payer: Medicare Other | Admitting: Internal Medicine

## 2014-02-09 ENCOUNTER — Other Ambulatory Visit: Payer: Self-pay | Admitting: Neurology

## 2014-02-09 MED ORDER — PRAMIPEXOLE DIHYDROCHLORIDE 1 MG PO TABS: 1.0000 mg | ORAL_TABLET | Freq: Three times a day (TID) | ORAL | Status: DC

## 2014-02-09 NOTE — Telephone Encounter (Signed)
Mirapex refill requested. Per last office note- patient to remain on medication and take 1 mg TID. Refill approved and sent to patient's pharmacy.

## 2014-02-12 ENCOUNTER — Telehealth: Payer: Self-pay | Admitting: Neurology

## 2014-02-12 MED ORDER — CARBIDOPA-LEVODOPA 25-100 MG PO TABS: 1.0000 | ORAL_TABLET | Freq: Four times a day (QID) | ORAL | Status: DC

## 2014-02-12 NOTE — Telephone Encounter (Signed)
Patient came by the office and stated he is needing to take 1 tablet of Carbidopa Levodopa 4 times daily. New RX approved and sent to patient's pharmacy per Dr Tat.

## 2014-02-12 NOTE — Telephone Encounter (Signed)
Pt called requesting to speak with a nurse regarding his meds.  C/B [phone]

## 2014-02-12 NOTE — Telephone Encounter (Signed)
SPoke with patient and confirmed I sent in RX for 1 tablet QID of Carbidopa Levodopa to his pharmacy this morning.

## 2014-03-20 ENCOUNTER — Encounter: Payer: Self-pay | Admitting: Neurology

## 2014-03-20 ENCOUNTER — Ambulatory Visit (INDEPENDENT_AMBULATORY_CARE_PROVIDER_SITE_OTHER): Payer: Medicare Other | Admitting: Neurology

## 2014-03-20 VITALS — BP 140/60 | HR 68 | Temp 98.0°F | Resp 16 | Wt 180.4 lb

## 2014-03-20 DIAGNOSIS — G20 Parkinson's disease: Secondary | ICD-10-CM

## 2014-03-20 NOTE — Procedures (Signed)
DBS Programming was performed.    Total time spent programming was 30 minutes.  Device was confirmed to be on.  Soft start was confirmed to be on.  Impedences were checked and were within normal limits.  Battery was checked and was determined to be functioning normally and not near the end of life.  Final settings were as follows:  Left brain electrode, Group A:     2-C+; Amplitude  3.5   V   ; Pulse width 60 microseconds;   Frequency   140   Hz.  Left brain electrode, group B:  1-C+, amplitude: 3.6 V, PW: 60, frequency 140  Group B was active at the time pt left the office.  Right brain electrode:     n/a

## 2014-03-20 NOTE — Progress Notes (Signed)
1`   Casey Tucker was seen today in the movement disorders clinic for neurologic consultation at the request of [name], MD.  The consultation is for the evaluation of PD.  The records that were made available to me were reviewed.  Pt is accompanied by his wife who supplements the history.   Pt reports first sx was in 2006.  Pt is R hand dominant.  Pt states that his next door neighbor (Network engineer) noted that he wasn't swinging his arm and thought that he had a stroke. He subsequently saw [name] in about 2006 and he was dx with PD.  He was started on mirapex and azilect.  It helped the overall stiffness.  Tremor developed in the R arm around the same time as the diagnosis.  Levodopa was started in approximately 2009-2010.  He also started an exercise program and he believes that has made a huge difference.  He began to have some motor fluctuations and saw [name] and had his surgery done in 02/2013.  It was a unilateral implant, placed into the L STN.  He has noted that he is sleeping much better since surgery.  No tremor.  [name] programmed the DBS one week post op.  He f/u with the NP at [facility], and he now has an A and a B setting.  He hates the A setting (doesn't walk well) and he "cranked" his own B setting up to 280 (assuming frequency) and this helped walking some but he still has problems with balance and freezing.  He is on mirapex, 1 mg tid.  He is on carbidopa/levodopa 25/100, 7:30am/11am/2pm/6pm.    11/15/13:  Pt returns today for follow up.  Overall, he has been doing well.  He is off of medication this AM for programming.  His wife reports that he has done better on his new setting.  Seems to be walking better with less dyskinesia.  Until this morning, he was still on the carbidopa/levodopa 25/100, 1 tablet 4 times per day, Azilect 1 mg daily and Mirapex 1 mg 3 times per day.  He is exercising faithfully.  He has not had any falls.  He has been shuffling a little more this  morning, since he is off of medication.  11/21/13 update:  The patient is returning for followup, earlier than expected.  The patient reports that he really had not been doing as well over the weekend.  He did not feel like he was walking as well. He felt like the setting "wore" off 30 mins before the next levodopa dosage.  His wife wonders if he wasn't doing as well because he was at the beach this past weekend and there were more stairs to negotiate.  No falls.  No hallucinations   He did decrease his carbidopa/levodopa 25/100, one tablet 3 times per day from one tablet 4 times per day.  He is still on Mirapex 1 mg 3 times per day and Azilect 1 mg daily.  12/20/13 update:  The patient returns today for followup, accompanied by his wife and daughter who supplements the history.  The patient is currently on Mirapex, 1 mg 3 times a day, Azilect 1 mg daily and carbidopa/levodopa 25/100 at 7:30 AM/11 AM/2 PM and 6:30 PM.  Overall, the patient states that he is doing much better on his current setting.  It is not seeming to "wear off."  He is having some difficulty in the middle of the night when he gets  up and sometimes has to use a cane, but otherwise is doing well.  No falls.  On one occasion, he did try to get up in the morning and go and take a walk and was able to walk a few miles without taking his medication, but then he needed it when he got home.  He has noticed some dyskinesia, but it has not been particularly bothersome.  He would like to decrease dyskinesia, if able.  03/20/14 update:  Pt is f//u today.  Overall doing well.  He tried to decrease his carbidopa/levodopa 25/100 from one tablet 4 times per day to 1/0.5/0.5/1, but ended up going back up to one tablet 4 times per day.  He finds that he is due for his second dose at 11 AM, but by 10:30 AM he is shuffling, and he thinks it is primarily the right foot.  When he goes hiking, he states that he takes an extra half tablet at 10:30 AM and as well and  often takes another half tablet in the afternoon.  He finds that protein interferes with the levodopa dosing.  He continues to take 2 Flexeril at night.  He does not know if it helps, but has not tried to stop that since the DBS surgery.  He finds that the only time he has dyskinesia is at the end of the dose.  He asks me if we can slightly increase the DBS stimulation.  He continues to exercise.  No falls.  No hallucinations.  PREVIOUS MEDICATIONS: Sinemet, Mirapex and azilect  ALLERGIES:   Allergies  Allergen Reactions  . Amoxicillin   . Celebrex [Celecoxib]   . Demerol Other (See Comments)    Hallucinations   . Penicillins     REACTION: RASH    CURRENT MEDICATIONS:  Current Outpatient Prescriptions on File Prior to Visit  Medication Sig Dispense Refill  . carbidopa-levodopa (SINEMET IR) 25-100 MG per tablet Take 1 tablet by mouth 4 (four) times daily.  120 tablet  1  . clonazePAM (KLONOPIN) 0.5 MG tablet Take 2 tablets (1 mg total) by mouth every evening.  60 tablet  3  . cyclobenzaprine (FLEXERIL) 10 MG tablet Take 2 tablets (20 mg total) by mouth at bedtime.  60 tablet  5  . pramipexole (MIRAPEX) 1 MG tablet Take 1 tablet (1 mg total) by mouth 3 (three) times daily.  90 tablet  5  . rasagiline (AZILECT) 1 MG TABS tablet Take 1 tablet (1 mg total) by mouth daily.  30 tablet  5  . senna (SENOKOT) 8.6 MG tablet Take 1 tablet by mouth daily.       No current facility-administered medications on file prior to visit.    PAST MEDICAL HISTORY:   Past Medical History  Diagnosis Date  . GERD (gastroesophageal reflux disease) Pre 2002    Mild  . Ruptured appendix teens  . History of kidney stones   . 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 Parkinson disease 2004    Slowly progressive  . Arthritis     left hip     PAST SURGICAL HISTORY:   Past Surgical History  Procedure Laterality Date  . Neuro  evaluation  12/19/2004    [name], benign tremor  . Doppler echocardiography  01/10/2009    LV NML Mild LVH EF 60-65% aortic sclerosis w/0 stenosis   . Appendectomy    . Joint replacement  left hip replacement 08/14/11/[facility]   . Total hip revision  08/27/2011    Procedure: TOTAL HIP REVISION;  Surgeon: Mauri Pole, MD;  Location: [facility];  Service: Orthopedics;  Laterality: Left;  . Deep brain stimulator placement  8/14    L STN    SOCIAL HISTORY:   History   Social History  . Marital Status: Married    Spouse Name: N/A    Number of Children: 2  . Years of Education: N/A   Occupational History  . Optometrist     medically retired   Social History Main Topics  . Smoking status: Never Smoker   . Smokeless tobacco: Never Used  . Alcohol Use: 3.5 oz/week    7 drink(s) per week     Comment: occassionally  . Drug Use: No  . Sexual Activity: Yes   Other Topics Concern  . Not on file   Social History Narrative   Married, lives with wife   2 daughters   Has living will   Wife has health care POA---then daughters   Would still accept CPR--but no prolonged artificial means (ventilator or tube feeds)    FAMILY HISTORY:   Family Status  Relation Status Death Age  . Mother Deceased 41    knee replacement  . Father Deceased 30    tremor  . Sister Engineer, mining in [place], [place]    ROS:  A complete 10 system review of systems was obtained and was unremarkable apart from what is mentioned above.  PHYSICAL EXAMINATION:    VITALS:   Filed Vitals:   03/20/14 1041  BP: 140/60  Pulse: 68  Temp: 98 F (36.7 C)  Resp: 16  Weight: 180 lb 6.4 oz (81.829 kg)   Pt seen at 11 am and it was time for levodopa dosing but he had not taken it.    GEN:  The patient appears stated age and is in NAD. HEENT:  Normocephalic, atraumatic.  The mucous membranes are moist. The superficial temporal arteries are without ropiness or tenderness.   Neurological  examination:  Orientation: The patient is alert and oriented x3. Fund of knowledge is appropriate.  Recent and remote memory are intact.  Attention and concentration are normal.    Able to name objects and repeat phrases. Cranial nerves: There is good facial symmetry.  There is minimal facial hypomimia.  Pupils are equal round and reactive to light bilaterally.  The speech is slightly dysarthric but fluent. Soft palate rises symmetrically and there is no tongue deviation. Hearing is intact to conversational tone. Motor: Strength is 5/5 in the bilateral upper and lower extremities.   Shoulder shrug is equal and symmetric.  There is no pronator drift.  Movement examination: Tone: There is normal tone in the bilateral upper extremities.  The tone in the lower extremities is normal.  Abnormal movements: There is no dyskinesia today Coordination:  There is no significant decremation with RAM's Gait and Station: The patient has no difficulty arising out of a deep-seated chair and he "runs" down the hall and is short stepped but does pretty well.  DBS programming was performed today which is described in more detail on a separate programming procedural notes.  In brief, I slightly increased the voltage, in hopes to decrease the levodopa.    ASSESSMENT/PLAN:  1.  Idiopathic Parkinson's disease.    -The patient is status post left STN DBS at [facility] on 03/14/2013.  -If able he will again  try to decrease the carbidopa/levodopa 25/100 qid to One tablet at 7:30 AM/half tablet at 11 AM/half tablet at 2 PM/one tablet at 6:30 PM.  -For now, he will remain on the Mirapex, 1 mg 3 times per day as well as Azilect, 1 mg daily.    -I. did talk to the patient about the fact that sometimes, it is difficult to treat patients who have had unilateral DBS, given the fact that the DBS only treats one side.  In the past, however, he really only has had one-sided symptoms, so hopefully this will not be so challenging.  -try to  wean and d/c flexeril  -He is exercising and I encouraged him to continue to do so. 2.  I will plan to see him back in the next few months, sooner should new neurologic issues arise.

## 2014-03-21 ENCOUNTER — Other Ambulatory Visit: Payer: Self-pay | Admitting: Neurology

## 2014-03-21 ENCOUNTER — Telehealth: Payer: Self-pay | Admitting: Neurology

## 2014-03-21 ENCOUNTER — Ambulatory Visit: Payer: Medicare Other | Admitting: Neurology

## 2014-03-21 MED ORDER — CLONAZEPAM 0.5 MG PO TABS: 1.0000 mg | ORAL_TABLET | Freq: Every evening | ORAL | Status: DC

## 2014-03-21 MED ORDER — RASAGILINE MESYLATE 0.5 MG PO TABS: 1.0000 mg | ORAL_TABLET | Freq: Every day | ORAL | Status: DC

## 2014-03-21 NOTE — Telephone Encounter (Signed)
Patient aware we only have Azilect 0.5 but samples at front for pick up if he needs them. He will pick up today.

## 2014-03-21 NOTE — Telephone Encounter (Signed)
Pt called requesting to if he could receive 4 samples of AZILECT 1mg  C/b [phone]

## 2014-03-27 ENCOUNTER — Ambulatory Visit: Payer: Medicare Other | Admitting: Neurology

## 2014-03-27 ENCOUNTER — Telehealth: Payer: Self-pay | Admitting: Neurology

## 2014-03-27 NOTE — Telephone Encounter (Signed)
Pt needs a dx code for the last visit to be added to the bill so that medicare will pay. Pt phone number is [phone]

## 2014-03-27 NOTE — Telephone Encounter (Signed)
Spoke with patient - on his AVS it did not have a diagnosis. I advised that we printed this before putting in his diagnosis but that it was put in the system and would be filed to Medicare.

## 2014-04-12 DIAGNOSIS — Z23 Encounter for immunization: Secondary | ICD-10-CM | POA: Diagnosis not present

## 2014-04-20 ENCOUNTER — Ambulatory Visit (INDEPENDENT_AMBULATORY_CARE_PROVIDER_SITE_OTHER): Payer: Medicare Other | Admitting: Internal Medicine

## 2014-04-20 ENCOUNTER — Encounter: Payer: Self-pay | Admitting: Internal Medicine

## 2014-04-20 VITALS — BP 126/68 | HR 71 | Temp 97.6°F | Ht 71.5 in | Wt 181.0 lb

## 2014-04-20 DIAGNOSIS — M169 Osteoarthritis of hip, unspecified: Secondary | ICD-10-CM | POA: Diagnosis not present

## 2014-04-20 DIAGNOSIS — IMO0001 Reserved for inherently not codable concepts without codable children: Secondary | ICD-10-CM

## 2014-04-20 DIAGNOSIS — G20 Parkinson's disease: Secondary | ICD-10-CM | POA: Diagnosis not present

## 2014-04-20 DIAGNOSIS — Z1211 Encounter for screening for malignant neoplasm of colon: Secondary | ICD-10-CM

## 2014-04-20 DIAGNOSIS — Z0000 Encounter for general adult medical examination without abnormal findings: Secondary | ICD-10-CM

## 2014-04-20 DIAGNOSIS — Z7189 Other specified counseling: Secondary | ICD-10-CM

## 2014-04-20 DIAGNOSIS — I700 Atherosclerosis of aorta: Secondary | ICD-10-CM | POA: Diagnosis not present

## 2014-04-20 DIAGNOSIS — R251 Tremor, unspecified: Secondary | ICD-10-CM

## 2014-04-20 DIAGNOSIS — K219 Gastro-esophageal reflux disease without esophagitis: Secondary | ICD-10-CM | POA: Diagnosis not present

## 2014-04-20 LAB — CBC WITH DIFFERENTIAL/PLATELET
BASOS PCT: 0.5 % (ref 0.0–3.0)
Basophils Absolute: 0 10*3/uL (ref 0.0–0.1)
EOS PCT: 7.2 % — AB (ref 0.0–5.0)
Eosinophils Absolute: 0.3 10*3/uL (ref 0.0–0.7)
HCT: 38.7 % — ABNORMAL LOW (ref 39.0–52.0)
Hemoglobin: 13 g/dL (ref 13.0–17.0)
Lymphocytes Relative: 29.5 % (ref 12.0–46.0)
Lymphs Abs: 1.1 10*3/uL (ref 0.7–4.0)
MCHC: 33.7 g/dL (ref 30.0–36.0)
MCV: 109.7 fl — ABNORMAL HIGH (ref 78.0–100.0)
Monocytes Absolute: 0.5 10*3/uL (ref 0.1–1.0)
Monocytes Relative: 13.9 % — ABNORMAL HIGH (ref 3.0–12.0)
NEUTROS PCT: 48.9 % (ref 43.0–77.0)
Neutro Abs: 1.8 10*3/uL (ref 1.4–7.7)
Platelets: 252 10*3/uL (ref 150.0–400.0)
RBC: 3.53 Mil/uL — AB (ref 4.22–5.81)
RDW: 13.7 % (ref 11.5–15.5)
WBC: 3.7 10*3/uL — AB (ref 4.0–10.5)

## 2014-04-20 LAB — COMPREHENSIVE METABOLIC PANEL
ALBUMIN: 3.7 g/dL (ref 3.5–5.2)
ALT: 5 U/L (ref 0–53)
AST: 14 U/L (ref 0–37)
Alkaline Phosphatase: 60 U/L (ref 39–117)
BUN: 17 mg/dL (ref 6–23)
CALCIUM: 8.5 mg/dL (ref 8.4–10.5)
CHLORIDE: 104 meq/L (ref 96–112)
CO2: 30 mEq/L (ref 19–32)
Creatinine, Ser: 0.7 mg/dL (ref 0.4–1.5)
GFR: 117.65 mL/min (ref >60.00)
GLUCOSE: 92 mg/dL (ref 70–99)
POTASSIUM: 4.1 meq/L (ref 3.5–5.1)
Sodium: 138 mEq/L (ref 135–145)
Total Bilirubin: 1.4 mg/dL — ABNORMAL HIGH (ref 0.2–1.2)
Total Protein: 6.5 g/dL (ref 6.0–8.3)

## 2014-04-20 LAB — T4, FREE: FREE T4: 0.89 ng/dL (ref 0.60–1.60)

## 2014-04-20 NOTE — Assessment & Plan Note (Signed)
Has been quiet lately No meds

## 2014-04-20 NOTE — Assessment & Plan Note (Signed)
No symptoms Shouldn't need any follow up

## 2014-04-20 NOTE — Assessment & Plan Note (Signed)
Ecstatic with the DBS Doing very well Follows with Dr Carles Collet

## 2014-04-20 NOTE — Assessment & Plan Note (Signed)
See social history 

## 2014-04-20 NOTE — Progress Notes (Signed)
Subjective:     Patient ID: Casey Tucker, male    DOB: 03/16/1941, 73 y.o.   MRN: [id number]  HPI Here for Medicare wellness and follow up Reviewed form and advanced directives Reviewed other physicians No falls. No depression or anhedonia Occasional alcohol. No tobacco Goes to fitness center ~5 days per week. Independent in instrumental ADLs No cognitive problems  Has dry mouth and nose--may be from increased dose of the DBS No allergy problems or use of antihistamines Still really happy with his outcome from the DBS Does note a "gap" between the meds and DBS at times---trying to work that out Doesn't have any limitations due to the Parkinson's  Did have fracture of right foot early this year Healed now-- found after the fact by Dr Sabra Heck  No reflux problems No swallowing problems All that better soon after getting over the stress of mom's death years ago  Hip is doing well Since the revision--he is fine without pain or limited movement  No chest pain No SOB No dizziness or syncope  No edema  Current Outpatient Prescriptions on File Prior to Visit  Medication Sig Dispense Refill  . carbidopa-levodopa (SINEMET IR) 25-100 MG per tablet Take 1 tablet by mouth 4 (four) times daily.  120 tablet  1  . clonazePAM (KLONOPIN) 0.5 MG tablet Take 2 tablets (1 mg total) by mouth every evening.  60 tablet  5  . cyclobenzaprine (FLEXERIL) 10 MG tablet Take 2 tablets (20 mg total) by mouth at bedtime.  60 tablet  5  . pramipexole (MIRAPEX) 1 MG tablet Take 1 tablet (1 mg total) by mouth 3 (three) times daily.  90 tablet  5  . rasagiline (AZILECT) 0.5 MG TABS tablet Take 2 tablets (1 mg total) by mouth daily.  56 tablet  0  . rasagiline (AZILECT) 1 MG TABS tablet Take 1 tablet (1 mg total) by mouth daily.  30 tablet  5  . senna (SENOKOT) 8.6 MG tablet Take 1 tablet by mouth daily.       No current facility-administered medications on file prior to visit.    Allergies    Allergen Reactions  . Amoxicillin   . Celebrex [Celecoxib]   . Demerol Other (See Comments)    Hallucinations   . Penicillins     REACTION: RASH    Past Medical History  Diagnosis Date  . GERD (gastroesophageal reflux disease) Pre 2002    Mild  . Ruptured appendix teens  . History of kidney stones   . 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 Parkinson disease 2004    Slowly progressive  . Arthritis     left hip     Past Surgical History  Procedure Laterality Date  . Neuro evaluation  12/19/2004    [name], benign tremor  . Doppler echocardiography  01/10/2009    LV NML Mild LVH EF 60-65% aortic sclerosis w/0 stenosis   . Appendectomy    . Joint replacement      left hip replacement 08/14/11/[facility]   . Total hip revision  08/27/2011    Procedure: TOTAL HIP REVISION;  Surgeon: Mauri Pole, MD;  Location: [facility];  Service: Orthopedics;  Laterality: Left;  . Deep brain stimulator placement  8/14    L STN    Family History  Problem Relation Age of Onset  . Arthritis Mother     knee replacement  .  COPD Father     emphysema, smoker  . Heart disease Father     CHF  . Alcohol abuse Paternal Uncle     History   Social History  . Marital Status: Married    Spouse Name: N/A    Number of Children: 2  . Years of Education: N/A   Occupational History  . Optometrist     medically retired   Social History Main Topics  . Smoking status: Never Smoker   . Smokeless tobacco: Never Used  . Alcohol Use: 3.5 oz/week    7 drink(s) per week     Comment: occassionally  . Drug Use: No  . Sexual Activity: Yes   Other Topics Concern  . Not on file   Social History Narrative   Married, lives with wife   2 daughters      Has living will   Wife has health care POA---then daughters   Would still accept CPR--but no prolonged artificial means (ventilator or tube feeds)   Review of  Systems Sleeps well now--since the DBS Bowels are fine--no blood Appetite is fine Voids well. Nocturia x 2--stable. No daytime problems No sig arthritis pain    Objective:   Physical Exam  Constitutional: He is oriented to person, place, and time. He appears well-developed and well-nourished. No distress.  HENT:  Mouth/Throat: Oropharynx is clear and moist. No oropharyngeal exudate.  Neck: Normal range of motion. Neck supple. No thyromegaly present.  Cardiovascular: Normal rate, regular rhythm and intact distal pulses.  Exam reveals no gallop.   Soft aortic systolic murmur  Pulmonary/Chest: Effort normal and breath sounds normal. No respiratory distress. He has no wheezes. He has no rales.  Abdominal: Soft. There is no tenderness.  Musculoskeletal: He exhibits no edema and no tenderness.  Lymphadenopathy:    He has no cervical adenopathy.  Neurological: He is alert and oriented to person, place, and time.  October 31st? President--- "Ramonita Lab, [place]" [id number] Recall 3/3  Skin: No rash noted. No erythema.  Psychiatric: He has a normal mood and affect. His behavior is normal.          Assessment & Plan:

## 2014-04-20 NOTE — Progress Notes (Signed)
Pre visit review using our clinic review tool, if applicable. No additional management support is needed unless otherwise documented below in the visit note. 

## 2014-04-20 NOTE — Assessment & Plan Note (Signed)
I have personally reviewed the Medicare Annual Wellness questionnaire and have noted 1. The patient's medical and social history 2. Their use of alcohol, tobacco or illicit drugs 3. Their current medications and supplements 4. The patient's functional ability including ADL's, fall risks, home safety risks and hearing or visual             impairment. 5. Diet and physical activities 6. Evidence for depression or mood disorders  The patients weight, height, BMI and visual acuity have been recorded in the chart I have made referrals, counseling and provided education to the patient based review of the above and I have provided the pt with a written personalized care plan for preventive services.  I have provided you with a copy of your personalized plan for preventive services. Please take the time to review along with your updated medication list.  No PSA due to age Will do fecal immunoassay  Just had flu shot--will need to wait a month for prevnar

## 2014-04-20 NOTE — Assessment & Plan Note (Signed)
Doing well since the revision

## 2014-04-25 ENCOUNTER — Other Ambulatory Visit (INDEPENDENT_AMBULATORY_CARE_PROVIDER_SITE_OTHER): Payer: Medicare Other

## 2014-04-25 DIAGNOSIS — Z1211 Encounter for screening for malignant neoplasm of colon: Secondary | ICD-10-CM

## 2014-04-25 LAB — FECAL OCCULT BLOOD, IMMUNOCHEMICAL: Fecal Occult Bld: NEGATIVE

## 2014-05-07 ENCOUNTER — Telehealth: Payer: Self-pay | Admitting: Neurology

## 2014-05-07 NOTE — Telephone Encounter (Signed)
Pt called requesting to speak to a nurse regarding his symptoms.  C/B [phone]

## 2014-05-07 NOTE — Telephone Encounter (Signed)
Spoke with patient and he states he has not been able to cut down on his Levodopa to 1/2 tablets like he was trying to do and he is sometimes having to take a dose sooner than he was. Appt scheduled sooner to look at his DBS programming per patient request.

## 2014-05-14 ENCOUNTER — Ambulatory Visit (INDEPENDENT_AMBULATORY_CARE_PROVIDER_SITE_OTHER): Payer: Medicare Other | Admitting: Internal Medicine

## 2014-05-14 ENCOUNTER — Encounter: Payer: Self-pay | Admitting: Internal Medicine

## 2014-05-14 VITALS — BP 132/60 | HR 72 | Temp 98.1°F | Wt 183.0 lb

## 2014-05-14 DIAGNOSIS — J209 Acute bronchitis, unspecified: Secondary | ICD-10-CM

## 2014-05-14 MED ORDER — DOXYCYCLINE HYCLATE 100 MG PO TABS: 100.0000 mg | ORAL_TABLET | Freq: Two times a day (BID) | ORAL | Status: DC

## 2014-05-14 NOTE — Assessment & Plan Note (Signed)
Sick for 2-3 weeks, then worse over this weekend May have secondary bacterial infection Stop the guaifenesin Doxycycline

## 2014-05-14 NOTE — Progress Notes (Signed)
Pre visit review using our clinic review tool, if applicable. No additional management support is needed unless otherwise documented below in the visit note. 

## 2014-05-14 NOTE — Progress Notes (Signed)
Subjective:    Patient ID: Casey Tucker, male    DOB: 02-Sep-1940, 73 y.o.   MRN: [id number]  HPI Having bronchial congestion Started mildly 3-4 weeks ago Got worse this weekend with fishing trip that wore him out  Using guafenisin---does break it up He thinks it is improving but wife is concerned  Sinus congestion and drainage No sore throat or ear pain Cough mostly at night--- mainly white sputum  No fever No SOB  No other meds for this  Current Outpatient Prescriptions on File Prior to Visit  Medication Sig Dispense Refill  . carbidopa-levodopa (SINEMET IR) 25-100 MG per tablet Take 1 tablet by mouth 4 (four) times daily.  120 tablet  1  . clonazePAM (KLONOPIN) 0.5 MG tablet Take 2 tablets (1 mg total) by mouth every evening.  60 tablet  5  . cyclobenzaprine (FLEXERIL) 10 MG tablet Take 2 tablets (20 mg total) by mouth at bedtime.  60 tablet  5  . pramipexole (MIRAPEX) 1 MG tablet Take 1 tablet (1 mg total) by mouth 3 (three) times daily.  90 tablet  5  . rasagiline (AZILECT) 0.5 MG TABS tablet Take 2 tablets (1 mg total) by mouth daily.  56 tablet  0  . senna (SENOKOT) 8.6 MG tablet Take 1 tablet by mouth daily.       No current facility-administered medications on file prior to visit.    Allergies  Allergen Reactions  . Amoxicillin   . Celebrex [Celecoxib]   . Demerol Other (See Comments)    Hallucinations   . Penicillins     REACTION: RASH    Past Medical History  Diagnosis Date  . GERD (gastroesophageal reflux disease) Pre 2002    Mild  . Ruptured appendix teens  . History of kidney stones   . 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 Parkinson disease 2004    Slowly progressive  . Arthritis     left hip     Past Surgical History  Procedure Laterality Date  . Neuro evaluation  12/19/2004    [name], benign tremor  . Doppler echocardiography  01/10/2009    LV NML Mild  LVH EF 60-65% aortic sclerosis w/0 stenosis   . Appendectomy    . Joint replacement      left hip replacement 08/14/11/[facility]   . Total hip revision  08/27/2011    Procedure: TOTAL HIP REVISION;  Surgeon: Mauri Pole, MD;  Location: [facility];  Service: Orthopedics;  Laterality: Left;  . Deep brain stimulator placement  8/14    L STN    Family History  Problem Relation Age of Onset  . Arthritis Mother     knee replacement  . COPD Father     emphysema, smoker  . Heart disease Father     CHF  . Alcohol abuse Paternal Uncle     History   Social History  . Marital Status: Married    Spouse Name: N/A    Number of Children: 2  . Years of Education: N/A   Occupational History  . Optometrist     medically retired   Social History Main Topics  . Smoking status: Never Smoker   . Smokeless tobacco: Never Used  . Alcohol Use: 3.5 oz/week    7 drink(s) per week     Comment: occassionally  . Drug Use: No  . Sexual Activity: Yes  Other Topics Concern  . Not on file   Social History Narrative   Married, lives with wife   2 daughters      Has living will   Wife has health care POA---then daughters   Would still accept CPR--but no prolonged artificial means (ventilator or tube feeds)   Review of Systems No rash No vomiting or diarrhea    Objective:   Physical Exam  Constitutional: He appears well-developed and well-nourished. No distress.  HENT:  Mouth/Throat: Oropharynx is clear and moist. No oropharyngeal exudate.  No sinus tenderness Mild nasal congestion TMs normal   Neck: Normal range of motion. Neck supple. No thyromegaly present.  Pulmonary/Chest: Effort normal and breath sounds normal. No respiratory distress. He has no wheezes. He has no rales.  Lymphadenopathy:    He has no cervical adenopathy.          Assessment & Plan:

## 2014-05-18 ENCOUNTER — Ambulatory Visit (INDEPENDENT_AMBULATORY_CARE_PROVIDER_SITE_OTHER): Payer: Medicare Other | Admitting: Neurology

## 2014-05-18 ENCOUNTER — Encounter: Payer: Self-pay | Admitting: Neurology

## 2014-05-18 VITALS — BP 150/64 | HR 84 | Ht 72.0 in | Wt 179.0 lb

## 2014-05-18 DIAGNOSIS — Z9889 Other specified postprocedural states: Secondary | ICD-10-CM

## 2014-05-18 DIAGNOSIS — G20 Parkinson's disease: Secondary | ICD-10-CM | POA: Diagnosis not present

## 2014-05-18 DIAGNOSIS — G249 Dystonia, unspecified: Secondary | ICD-10-CM

## 2014-05-18 DIAGNOSIS — Z9689 Presence of other specified functional implants: Secondary | ICD-10-CM

## 2014-05-18 MED ORDER — ENTACAPONE 200 MG PO TABS: 200.0000 mg | ORAL_TABLET | Freq: Two times a day (BID) | ORAL | Status: DC

## 2014-05-18 NOTE — Procedures (Signed)
DBS Programming was performed.    Total time spent programming was 30 minutes.  Device was confirmed to be on.  Soft start was confirmed to be on.  Impedences were checked and were within normal limits.  Battery was checked and was determined to be functioning normally and not near the end of life.  Final settings were as follows:  Left brain electrode, Group A:     2-C+; Amplitude  3.5   V   ; Pulse width 60 microseconds;   Frequency   140   Hz.  Left brain electrode, group B:  1-C+, amplitude: 3.5 V, PW: 60, frequency 130  Group B was active at the time pt left the office.  Right brain electrode:     n/a

## 2014-05-18 NOTE — Progress Notes (Signed)
1`   Casey Tucker was seen today in the movement disorders clinic for neurologic consultation at the request of [name], MD.  The consultation is for the evaluation of PD.  The records that were made available to me were reviewed.  Pt is accompanied by his wife who supplements the history.   Pt reports first sx was in 2006.  Pt is R hand dominant.  Pt states that his next door neighbor (Network engineer) noted that he wasn't swinging his arm and thought that he had a stroke. He subsequently saw [name] in about 2006 and he was dx with PD.  He was started on mirapex and azilect.  It helped the overall stiffness.  Tremor developed in the R arm around the same time as the diagnosis.  Levodopa was started in approximately 2009-2010.  He also started an exercise program and he believes that has made a huge difference.  He began to have some motor fluctuations and saw [name] and had his surgery done in 02/2013.  It was a unilateral implant, placed into the L STN.  He has noted that he is sleeping much better since surgery.  No tremor.  [name] programmed the DBS one week post op.  He f/u with the NP at [facility], and he now has an A and a B setting.  He hates the A setting (doesn't walk well) and he "cranked" his own B setting up to 280 (assuming frequency) and this helped walking some but he still has problems with balance and freezing.  He is on mirapex, 1 mg tid.  He is on carbidopa/levodopa 25/100, 7:30am/11am/2pm/6pm.    11/15/13:  Pt returns today for follow up.  Overall, he has been doing well.  He is off of medication this AM for programming.  His wife reports that he has done better on his new setting.  Seems to be walking better with less dyskinesia.  Until this morning, he was still on the carbidopa/levodopa 25/100, 1 tablet 4 times per day, Azilect 1 mg daily and Mirapex 1 mg 3 times per day.  He is exercising faithfully.  He has not had any falls.  He has been shuffling a little more this  morning, since he is off of medication.  11/21/13 update:  The patient is returning for followup, earlier than expected.  The patient reports that he really had not been doing as well over the weekend.  He did not feel like he was walking as well. He felt like the setting "wore" off 30 mins before the next levodopa dosage.  His wife wonders if he wasn't doing as well because he was at the beach this past weekend and there were more stairs to negotiate.  No falls.  No hallucinations   He did decrease his carbidopa/levodopa 25/100, one tablet 3 times per day from one tablet 4 times per day.  He is still on Mirapex 1 mg 3 times per day and Azilect 1 mg daily.  12/20/13 update:  The patient returns today for followup, accompanied by his wife and daughter who supplements the history.  The patient is currently on Mirapex, 1 mg 3 times a day, Azilect 1 mg daily and carbidopa/levodopa 25/100 at 7:30 AM/11 AM/2 PM and 6:30 PM.  Overall, the patient states that he is doing much better on his current setting.  It is not seeming to "wear off."  He is having some difficulty in the middle of the night when he gets  up and sometimes has to use a cane, but otherwise is doing well.  No falls.  On one occasion, he did try to get up in the morning and go and take a walk and was able to walk a few miles without taking his medication, but then he needed it when he got home.  He has noticed some dyskinesia, but it has not been particularly bothersome.  He would like to decrease dyskinesia, if able.  03/20/14 update:  Pt is f//u today.  Overall doing well.  He tried to decrease his carbidopa/levodopa 25/100 from one tablet 4 times per day to 1/0.5/0.5/1, but ended up going back up to one tablet 4 times per day.  He finds that he is due for his second dose at 11 AM, but by 10:30 AM he is shuffling, and he thinks it is primarily the right foot.  When he goes hiking, he states that he takes an extra half tablet at 10:30 AM and as well and  often takes another half tablet in the afternoon.  He finds that protein interferes with the levodopa dosing.  He continues to take 2 Flexeril at night.  He does not know if it helps, but has not tried to stop that since the DBS surgery.  He finds that the only time he has dyskinesia is at the end of the dose.  He asks me if we can slightly increase the DBS stimulation.  He continues to exercise.  No falls.  No hallucinations.  05/18/14 update: The patient is following up today, accompanied by his wife who supplements the history.  I tried to slightly decrease the levodopa, but the patient is was not able to do that.  He remains on carbidopa/levodopa 25/100, 1 tablet 4 times per day.  He is also on Mirapex 1 mg 3 times per day along with Azilect 1 mg daily. C/o some "slight" freezing episodes and med wearing off 1/2 hr before due but at the same time having frequent dyskinesia of the R leg.  No falls.  Staying very active.  No hallucinations.  C/o cost of azilect and asks about its benefit.    PREVIOUS MEDICATIONS: Sinemet, Mirapex and azilect  ALLERGIES:   Allergies  Allergen Reactions  . Amoxicillin   . Celebrex [Celecoxib]   . Demerol Other (See Comments)    Hallucinations   . Penicillins     REACTION: RASH    CURRENT MEDICATIONS:  Current Outpatient Prescriptions on File Prior to Visit  Medication Sig Dispense Refill  . carbidopa-levodopa (SINEMET IR) 25-100 MG per tablet Take 1 tablet by mouth 4 (four) times daily.  120 tablet  1  . clonazePAM (KLONOPIN) 0.5 MG tablet Take 2 tablets (1 mg total) by mouth every evening.  60 tablet  5  . cyclobenzaprine (FLEXERIL) 10 MG tablet Take 2 tablets (20 mg total) by mouth at bedtime.  60 tablet  5  . doxycycline (VIBRA-TABS) 100 MG tablet Take 1 tablet (100 mg total) by mouth 2 (two) times daily.  14 tablet  0  . pramipexole (MIRAPEX) 1 MG tablet Take 1 tablet (1 mg total) by mouth 3 (three) times daily.  90 tablet  5  . rasagiline (AZILECT) 0.5  MG TABS tablet Take 2 tablets (1 mg total) by mouth daily.  56 tablet  0  . senna (SENOKOT) 8.6 MG tablet Take 1 tablet by mouth daily.       No current facility-administered medications on file prior to visit.  PAST MEDICAL HISTORY:   Past Medical History  Diagnosis Date  . GERD (gastroesophageal reflux disease) Pre 2002    Mild  . Ruptured appendix teens  . History of kidney stones   . 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 Parkinson disease 2004    Slowly progressive  . Arthritis     left hip     PAST SURGICAL HISTORY:   Past Surgical History  Procedure Laterality Date  . Neuro evaluation  12/19/2004    [name], benign tremor  . Doppler echocardiography  01/10/2009    LV NML Mild LVH EF 60-65% aortic sclerosis w/0 stenosis   . Appendectomy    . Joint replacement      left hip replacement 08/14/11/[facility]   . Total hip revision  08/27/2011    Procedure: TOTAL HIP REVISION;  Surgeon: Mauri Pole, MD;  Location: [facility];  Service: Orthopedics;  Laterality: Left;  . Deep brain stimulator placement  8/14    L STN    SOCIAL HISTORY:   History   Social History  . Marital Status: Married    Spouse Name: N/A    Number of Children: 2  . Years of Education: N/A   Occupational History  . Optometrist     medically retired   Social History Main Topics  . Smoking status: Never Smoker   . Smokeless tobacco: Never Used  . Alcohol Use: 3.5 oz/week    7 drink(s) per week     Comment: occassionally  . Drug Use: No  . Sexual Activity: Yes   Other Topics Concern  . Not on file   Social History Narrative   Married, lives with wife   2 daughters      Has living will   Wife has health care POA---then daughters   Would still accept CPR--but no prolonged artificial means (ventilator or tube feeds)    FAMILY HISTORY:   Family Status  Relation Status Death Age  . Mother Deceased 28     knee replacement  . Father Deceased 5    tremor  . Sister Engineer, mining in [place], [place]    ROS:  A complete 10 system review of systems was obtained and was unremarkable apart from what is mentioned above.  PHYSICAL EXAMINATION:    VITALS:   Filed Vitals:   05/18/14 1101  BP: 150/64  Pulse: 84  Height: 6' (1.829 m)  Weight: 179 lb (81.194 kg)    GEN:  The patient appears stated age and is in NAD. HEENT:  Normocephalic, atraumatic.  The mucous membranes are moist. The superficial temporal arteries are without ropiness or tenderness.   Neurological examination:  Orientation: The patient is alert and oriented x3. Fund of knowledge is appropriate.  Recent and remote memory are intact.  Attention and concentration are normal.    Able to name objects and repeat phrases. Cranial nerves: There is good facial symmetry.  There is minimal facial hypomimia.  Pupils are equal round and reactive to light bilaterally.  The speech is slightly dysarthric but fluent. Soft palate rises symmetrically and there is no tongue deviation. Hearing is intact to conversational tone. Motor: Strength is 5/5 in the bilateral upper and lower extremities.   Shoulder shrug is equal and symmetric.  There is no pronator drift.  Movement examination: Tone: There is normal tone in the bilateral upper extremities.  The tone  in the lower extremities is normal.  Abnormal movements: There is dyskinesia today in the RLE Coordination:  There is no significant decremation with RAM's Gait and Station: The patient has no difficulty arising out of a deep-seated chair and he "runs" down the hall and is short stepped but does pretty well.  DBS programming was performed today which is described in more detail on a separate programming procedural notes.    ASSESSMENT/PLAN:  1.  Idiopathic Parkinson's disease.    -The patient is status post left STN DBS at [facility] on 03/14/2013.  -remain on the carbidopa/levodopa  25/100,1 tablet qid and add comtan 200 mg bid to 2 of levodopa dosages.  Difficult as pt c/o both freezing and dyskinesia.  Lowered stimulator just a bit as pt could not tolerate dropping levodopa as hoped and added comtan.  -For now, he will remain on the Mirapex, 1 mg 3 times per day.  D/c azilect due to cost.   -I. did talk to the patient about the fact that sometimes, it is difficult to treat patients who have had unilateral DBS, given the fact that the DBS only treats one side.  In the past, however, he really only has had one-sided symptoms, so hopefully this will not be so challenging.  -try to wean and d/c flexeril  -He is exercising and I encouraged him to continue to do so. 2.  I will plan to see him back in the next few months, sooner should new neurologic issues arise.

## 2014-06-12 ENCOUNTER — Telehealth: Payer: Self-pay | Admitting: Neurology

## 2014-06-12 NOTE — Telephone Encounter (Signed)
Called and made wife aware we cancelled his 06/19/14 appt and he is to call with any problems and follow up on 08/08/2013.

## 2014-06-12 NOTE — Telephone Encounter (Signed)
-----   Message from Colbert, DO sent at 06/12/2014  1:10 PM EST ----- Luvenia Starch, pt has appt 12/1 and 1/20.  He doesn't need the December appt and if doing okay, will just see him in january

## 2014-06-19 ENCOUNTER — Ambulatory Visit: Payer: BLUE CROSS/BLUE SHIELD | Admitting: Neurology

## 2014-07-10 ENCOUNTER — Other Ambulatory Visit: Payer: Self-pay | Admitting: Neurology

## 2014-07-10 NOTE — Telephone Encounter (Signed)
Carbidopa Levodopa refill requested. Per last office note- patient to remain on medication. Refill approved and sent to patient's pharmacy.   

## 2014-07-31 ENCOUNTER — Telehealth: Payer: Self-pay | Admitting: Neurology

## 2014-07-31 NOTE — Telephone Encounter (Signed)
Left message on machine for patient to call back.

## 2014-07-31 NOTE — Telephone Encounter (Signed)
Pt is wanting to talk to [facility] about medication please call [phone]

## 2014-07-31 NOTE — Telephone Encounter (Signed)
Spoke with patient. He did not remember he was to stop taking the flexeril, but missed some doses of medication and did not notice a difference. Aware I was going to deny refill and he will let us know if he has any problems.

## 2014-07-31 NOTE — Telephone Encounter (Signed)
Patient to call back. He was to wean off medication according to last office note. Awaiting call back to see if he tried to wean medication.

## 2014-08-08 ENCOUNTER — Encounter: Payer: Self-pay | Admitting: Neurology

## 2014-08-08 ENCOUNTER — Ambulatory Visit (INDEPENDENT_AMBULATORY_CARE_PROVIDER_SITE_OTHER): Payer: Medicare Other | Admitting: Neurology

## 2014-08-08 VITALS — BP 150/70 | HR 100 | Ht 72.0 in | Wt 183.0 lb

## 2014-08-08 DIAGNOSIS — G249 Dystonia, unspecified: Secondary | ICD-10-CM

## 2014-08-08 DIAGNOSIS — G20 Parkinson's disease: Secondary | ICD-10-CM

## 2014-08-08 DIAGNOSIS — Z9689 Presence of other specified functional implants: Secondary | ICD-10-CM

## 2014-08-08 DIAGNOSIS — Z9889 Other specified postprocedural states: Secondary | ICD-10-CM

## 2014-08-08 MED ORDER — RASAGILINE MESYLATE 1 MG PO TABS: 1.0000 mg | ORAL_TABLET | Freq: Every day | ORAL | Status: DC

## 2014-08-08 NOTE — Procedures (Signed)
DBS Programming was performed.    Total time spent programming was 30 minutes.  Device was confirmed to be on.  Soft start was confirmed to be on.  Impedences were checked and were within normal limits.  Battery was checked and was determined to be functioning normally and not near the end of life.  Final settings were as follows:  Left brain electrode, Group A:     2-C+; Amplitude  3.5   V   ; Pulse width 60 microseconds;   Frequency   140   Hz.  Left brain electrode, group B:  1-C+, amplitude: 3.8 V, PW: 60, frequency 140  Group B was active at the time pt left the office.  Right brain electrode:     n/a

## 2014-08-08 NOTE — Progress Notes (Signed)
1`   Casey Tucker was seen today in the movement disorders clinic for neurologic consultation at the request of [name], MD.  The consultation is for the evaluation of PD.  The records that were made available to me were reviewed.  Pt is accompanied by his wife who supplements the history.   Pt reports first sx was in 2006.  Pt is R hand dominant.  Pt states that his next door neighbor (Network engineer) noted that he wasn't swinging his arm and thought that he had a stroke. He subsequently saw [name] in about 2006 and he was dx with PD.  He was started on mirapex and azilect.  It helped the overall stiffness.  Tremor developed in the R arm around the same time as the diagnosis.  Levodopa was started in approximately 2009-2010.  He also started an exercise program and he believes that has made a huge difference.  He began to have some motor fluctuations and saw [name] and had his surgery done in 02/2013.  It was a unilateral implant, placed into the L STN.  He has noted that he is sleeping much better since surgery.  No tremor.  [name] programmed the DBS one week post op.  He f/u with the NP at [facility], and he now has an A and a B setting.  He hates the A setting (doesn't walk well) and he "cranked" his own B setting up to 280 (assuming frequency) and this helped walking some but he still has problems with balance and freezing.  He is on mirapex, 1 mg tid.  He is on carbidopa/levodopa 25/100, 7:30am/11am/2pm/6pm.    11/15/13:  Pt returns today for follow up.  Overall, he has been doing well.  He is off of medication this AM for programming.  His wife reports that he has done better on his new setting.  Seems to be walking better with less dyskinesia.  Until this morning, he was still on the carbidopa/levodopa 25/100, 1 tablet 4 times per day, Azilect 1 mg daily and Mirapex 1 mg 3 times per day.  He is exercising faithfully.  He has not had any falls.  He has been shuffling a little more this  morning, since he is off of medication.  11/21/13 update:  The patient is returning for followup, earlier than expected.  The patient reports that he really had not been doing as well over the weekend.  He did not feel like he was walking as well. He felt like the setting "wore" off 30 mins before the next levodopa dosage.  His wife wonders if he wasn't doing as well because he was at the beach this past weekend and there were more stairs to negotiate.  No falls.  No hallucinations   He did decrease his carbidopa/levodopa 25/100, one tablet 3 times per day from one tablet 4 times per day.  He is still on Mirapex 1 mg 3 times per day and Azilect 1 mg daily.  12/20/13 update:  The patient returns today for followup, accompanied by his wife and daughter who supplements the history.  The patient is currently on Mirapex, 1 mg 3 times a day, Azilect 1 mg daily and carbidopa/levodopa 25/100 at 7:30 AM/11 AM/2 PM and 6:30 PM.  Overall, the patient states that he is doing much better on his current setting.  It is not seeming to "wear off."  He is having some difficulty in the middle of the night when he gets  up and sometimes has to use a cane, but otherwise is doing well.  No falls.  On one occasion, he did try to get up in the morning and go and take a walk and was able to walk a few miles without taking his medication, but then he needed it when he got home.  He has noticed some dyskinesia, but it has not been particularly bothersome.  He would like to decrease dyskinesia, if able.  03/20/14 update:  Pt is f//u today.  Overall doing well.  He tried to decrease his carbidopa/levodopa 25/100 from one tablet 4 times per day to 1/0.5/0.5/1, but ended up going back up to one tablet 4 times per day.  He finds that he is due for his second dose at 11 AM, but by 10:30 AM he is shuffling, and he thinks it is primarily the right foot.  When he goes hiking, he states that he takes an extra half tablet at 10:30 AM and as well and  often takes another half tablet in the afternoon.  He finds that protein interferes with the levodopa dosing.  He continues to take 2 Flexeril at night.  He does not know if it helps, but has not tried to stop that since the DBS surgery.  He finds that the only time he has dyskinesia is at the end of the dose.  He asks me if we can slightly increase the DBS stimulation.  He continues to exercise.  No falls.  No hallucinations.  05/18/14 update: The patient is following up today, accompanied by his wife who supplements the history.  I tried to slightly decrease the levodopa, but the patient is was not able to do that.  He remains on carbidopa/levodopa 25/100, 1 tablet 4 times per day.  He is also on Mirapex 1 mg 3 times per day along with Azilect 1 mg daily. C/o some "slight" freezing episodes and med wearing off 1/2 hr before due but at the same time having frequent dyskinesia of the R leg.  No falls.  Staying very active.  No hallucinations.  C/o cost of azilect and asks about its benefit.    08/08/14 update:  Overall, the patient is doing well.  He has not taken medication since 6:30 this morning, and wanted me to see him off of medication.  He states that he has some "spotty freezing."  He asks about potentially taking metformin to help him.  He also asks about levodopa nasal spray.  He asks about several other experimental treatments.  He has occasional dyskinesia in the right foot but it is not bothersome to him.  Overall, he feels slightly slower than he used to.  He states that he generally is supposed to take his levodopa at 11 AM but he needs it by 10:30.  If he is very active he will need to take a few extra half dosages of levodopa throughout the day.  Generally, however, he takes 4 tablets of carbidopa/levodopa 25/100 throughout the day in addition to entacapone 200 mg twice a day and Mirapex 1 mg 3 times a day.  He is off of Azilect and wants to know if he should go back on it.  He noticed no  difference when discontinuing it.  He is off of Flexeril as well as noticed no trouble with that.  PREVIOUS MEDICATIONS: Sinemet, Mirapex and azilect  ALLERGIES:   Allergies  Allergen Reactions  . Amoxicillin   . Celebrex [Celecoxib]   . Demerol Other (  See Comments)    Hallucinations   . Penicillins     REACTION: RASH    CURRENT MEDICATIONS:  Current Outpatient Prescriptions on File Prior to Visit  Medication Sig Dispense Refill  . carbidopa-levodopa (SINEMET IR) 25-100 MG per tablet Take 1 tablet by mouth 4 (four) times daily. 120 tablet 5  . clonazePAM (KLONOPIN) 0.5 MG tablet Take 2 tablets (1 mg total) by mouth every evening. 60 tablet 5  . entacapone (COMTAN) 200 MG tablet Take 1 tablet (200 mg total) by mouth 2 (two) times daily. 60 tablet 5  . pramipexole (MIRAPEX) 1 MG tablet Take 1 tablet (1 mg total) by mouth 3 (three) times daily. 90 tablet 5  . senna (SENOKOT) 8.6 MG tablet Take 1 tablet by mouth daily.     No current facility-administered medications on file prior to visit.    PAST MEDICAL HISTORY:   Past Medical History  Diagnosis Date  . GERD (gastroesophageal reflux disease) Pre 2002    Mild  . Ruptured appendix teens  . History of kidney stones   . 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 Parkinson disease 2004    Slowly progressive  . Arthritis     left hip     PAST SURGICAL HISTORY:   Past Surgical History  Procedure Laterality Date  . Neuro evaluation  12/19/2004    [name], benign tremor  . Doppler echocardiography  01/10/2009    LV NML Mild LVH EF 60-65% aortic sclerosis w/0 stenosis   . Appendectomy    . Joint replacement      left hip replacement 08/14/11/[place]   . Total hip revision  08/27/2011    Procedure: TOTAL HIP REVISION;  Surgeon: Mauri Pole, MD;  Location: [facility];  Service: Orthopedics;  Laterality: Left;  . Deep brain stimulator placement  8/14     L STN    SOCIAL HISTORY:   History   Social History  . Marital Status: Married    Spouse Name: N/A    Number of Children: 2  . Years of Education: N/A   Occupational History  . Optometrist     medically retired   Social History Main Topics  . Smoking status: Never Smoker   . Smokeless tobacco: Never Used  . Alcohol Use: 3.5 oz/week    7 drink(s) per week     Comment: occassionally  . Drug Use: No  . Sexual Activity: Yes   Other Topics Concern  . Not on file   Social History Narrative   Married, lives with wife   2 daughters      Has living will   Wife has health care POA---then daughters   Would still accept CPR--but no prolonged artificial means (ventilator or tube feeds)    FAMILY HISTORY:   Family Status  Relation Status Death Age  . Mother Deceased 56    knee replacement  . Father Deceased 30    tremor  . Sister Engineer, mining in [place], [place]    ROS:  A complete 10 system review of systems was obtained and was unremarkable apart from what is mentioned above.  PHYSICAL EXAMINATION:    VITALS:   Filed Vitals:   08/08/14 1016  BP: 150/70  Pulse: 100  Height: 6' (1.829 m)  Weight: 183 lb (83.008 kg)    GEN:  The patient appears stated age and is in  NAD. HEENT:  Normocephalic, atraumatic.  The mucous membranes are moist. The superficial temporal arteries are without ropiness or tenderness.   Neurological examination:  Orientation: The patient is alert and oriented x3. Fund of knowledge is appropriate.  Recent and remote memory are intact.  Attention and concentration are normal.    Able to name objects and repeat phrases. Cranial nerves: There is good facial symmetry.  There is minimal facial hypomimia.  Pupils are equal round and reactive to light bilaterally.  The speech is slightly dysarthric but fluent. Soft palate rises symmetrically and there is no tongue deviation. Hearing is intact to conversational tone. Motor: Strength is 5/5 in  the bilateral upper and lower extremities.   Shoulder shrug is equal and symmetric.  There is no pronator drift.  Movement examination: Tone: There is normal tone in the bilateral upper extremities.  The tone in the lower extremities is normal.  Abnormal movements: There is dyskinesia today in the RLE Coordination:  There is no significant decremation with RAM's Gait and Station: The patient has no difficulty arising out of a deep-seated chair and he "runs" down the hall and is short stepped but does pretty well.  DBS programming was performed today which is described in more detail on a separate programming procedural notes.    ASSESSMENT/PLAN:  1.  Idiopathic Parkinson's disease.    -The patient is status post left STN DBS at [facility] on 03/14/2013.  -remain on the carbidopa/levodopa 25/100,1 tablet qid and comtan 200 mg bid  -For now, he will remain on the Mirapex, 1 mg 3 times per day.    -I did turn up the DBS today.  I warned him that it may cause more dyskinesia.  In the past, I turned it down but it sounds like he is more frustrated with slowness.  We ended up making him an appointment to more just in case I need to turn it back down because of significant dyskinesia.  He can cancel if needed.  -Talked to him about the experimental therapies that he mentioned to me.  -He is exercising and I encouraged him to continue to do so. 2.  I will plan to see him back in the next few months, sooner should new neurologic issues arise.

## 2014-08-09 ENCOUNTER — Ambulatory Visit: Payer: Medicare Other | Admitting: Neurology

## 2014-08-09 ENCOUNTER — Telehealth: Payer: Self-pay | Admitting: Neurology

## 2014-08-09 MED ORDER — CARBIDOPA-LEVODOPA ER 50-200 MG PO TBCR: 1.0000 | EXTENDED_RELEASE_TABLET | Freq: Every day | ORAL | Status: DC

## 2014-08-09 NOTE — Telephone Encounter (Signed)
Spoke with patient. He had a hard time resting last night. He had a lot of cramping and pain in calves. He had to get in a warm to tub to get some relief. He is feeling okay this morning, only some numbness at the bottom of his feet but the cramping is gone. He wants to know will this get better or will he get used to it? Should he keep appt today? Please advise.

## 2014-08-09 NOTE — Telephone Encounter (Signed)
That's odd, as I only went up on his stimulator and that certainly shouldn't worsen the leg cramping.  I remember him telling me he also had a hard time resting the night before that as well.  He may need a CR at night.  Ask him if he wants to try that; as long as he doesn't have a lot of dyskinesia (was my worry yesterday when I increased it) then likely won't change the stimulator today so probably can cancel if he wants

## 2014-08-09 NOTE — Telephone Encounter (Signed)
Spoke with patient. He agreed to try Levodopa 50/200 at night to help with cramping. RX sent to pharmacy. He states he is having a small amount of dyskinesia, but this isn't bothering him at all. Appt cancelled for today and rescheduled for 2 months. He will call if needs seen sooner.

## 2014-08-15 ENCOUNTER — Telehealth: Payer: Self-pay | Admitting: Neurology

## 2014-08-15 NOTE — Telephone Encounter (Signed)
Patient called and states he was not feeling well with new adjustment of DBS and changed himself from 2.8 to 3.6 and now feeling better.   He would also like a note written for tax purposes that states to continue his existing exercise program. Please advise if this is okay to do.

## 2014-08-16 NOTE — Telephone Encounter (Signed)
I didn't have it on 3.8 so assume you meant he went from 3.8 to 3.6 and that is fine.  And no problem at all for the note for the exercise program as it is really very beneficial for PD patients

## 2014-08-16 NOTE — Telephone Encounter (Signed)
Letter written and mailed to patient.  

## 2014-08-17 ENCOUNTER — Telehealth: Payer: Self-pay | Admitting: Neurology

## 2014-08-17 MED ORDER — CYCLOBENZAPRINE HCL 10 MG PO TABS: 20.0000 mg | ORAL_TABLET | Freq: Every day | ORAL | Status: DC

## 2014-08-17 NOTE — Telephone Encounter (Signed)
I probably wouldn't take it instead but okay to take with

## 2014-08-17 NOTE — Telephone Encounter (Signed)
Patient states the thinks the flexeril was working better for him to take 2 tablets at bedtime rather than the Carbidopa Levodopa 50/200. Can he switch back? Please advise.

## 2014-08-17 NOTE — Telephone Encounter (Signed)
Patient made aware he can take both. RX for flexeril sent to patient's pharmacy.

## 2014-08-22 DIAGNOSIS — L570 Actinic keratosis: Secondary | ICD-10-CM | POA: Diagnosis not present

## 2014-08-22 DIAGNOSIS — D2261 Melanocytic nevi of right upper limb, including shoulder: Secondary | ICD-10-CM | POA: Diagnosis not present

## 2014-08-22 DIAGNOSIS — D2272 Melanocytic nevi of left lower limb, including hip: Secondary | ICD-10-CM | POA: Diagnosis not present

## 2014-08-22 DIAGNOSIS — D235 Other benign neoplasm of skin of trunk: Secondary | ICD-10-CM | POA: Diagnosis not present

## 2014-08-22 DIAGNOSIS — D485 Neoplasm of uncertain behavior of skin: Secondary | ICD-10-CM | POA: Diagnosis not present

## 2014-08-22 DIAGNOSIS — Z85828 Personal history of other malignant neoplasm of skin: Secondary | ICD-10-CM | POA: Diagnosis not present

## 2014-08-22 DIAGNOSIS — D2271 Melanocytic nevi of right lower limb, including hip: Secondary | ICD-10-CM | POA: Diagnosis not present

## 2014-08-27 ENCOUNTER — Other Ambulatory Visit: Payer: Self-pay | Admitting: Neurology

## 2014-08-27 NOTE — Telephone Encounter (Signed)
Mirapex refill requested. Per last office note- patient to remain on medication. Refill approved and sent to patient's pharmacy.   

## 2014-09-25 ENCOUNTER — Ambulatory Visit: Payer: Medicare Other | Admitting: Neurology

## 2014-10-03 ENCOUNTER — Telehealth: Payer: Self-pay | Admitting: Neurology

## 2014-10-03 NOTE — Telephone Encounter (Signed)
Spoke with patient and will see him at his appt next week.

## 2014-10-03 NOTE — Telephone Encounter (Signed)
Left message on machine for patient to call back.

## 2014-10-03 NOTE — Telephone Encounter (Signed)
Pt called wanting to speak to a nurse regarding peanuts?  C/b [phone]

## 2014-10-10 ENCOUNTER — Encounter: Payer: Self-pay | Admitting: Neurology

## 2014-10-10 ENCOUNTER — Ambulatory Visit: Payer: Medicare Other | Admitting: Neurology

## 2014-10-10 ENCOUNTER — Ambulatory Visit (INDEPENDENT_AMBULATORY_CARE_PROVIDER_SITE_OTHER): Payer: Medicare Other | Admitting: Neurology

## 2014-10-10 VITALS — BP 178/88 | HR 84 | Ht 72.0 in | Wt 184.0 lb

## 2014-10-10 DIAGNOSIS — G20 Parkinson's disease: Secondary | ICD-10-CM | POA: Diagnosis not present

## 2014-10-10 DIAGNOSIS — Z9889 Other specified postprocedural states: Secondary | ICD-10-CM

## 2014-10-10 DIAGNOSIS — Z9689 Presence of other specified functional implants: Secondary | ICD-10-CM

## 2014-10-10 DIAGNOSIS — G249 Dystonia, unspecified: Secondary | ICD-10-CM

## 2014-10-10 MED ORDER — AMBULATORY NON FORMULARY MEDICATION: 1.0000 | Freq: Every day | Status: DC

## 2014-10-10 NOTE — Procedures (Signed)
DBS Programming was performed.    Total time spent programming was 20 minutes.  Device was confirmed to be on.  Soft start was confirmed to be on.  Impedences were checked and were within normal limits.  Battery was checked and was determined to be functioning normally and not near the end of life.  Final settings were as follows:  Left brain electrode, Group A:     2-C+; Amplitude  3.5   V   ; Pulse width 60 microseconds;   Frequency   140   Hz.  Left brain electrode, group B:  1-C+, amplitude: 3.6 V, PW: 60, frequency 130  Group B was active at the time pt left the office.  Right brain electrode:     n/a

## 2014-10-10 NOTE — Progress Notes (Signed)
1`   Casey Tucker was seen today in the movement disorders clinic for neurologic consultation at the request of [name], MD.  The consultation is for the evaluation of PD.  The records that were made available to me were reviewed.  Pt is accompanied by his wife who supplements the history.   Pt reports first sx was in 2006.  Pt is R hand dominant.  Pt states that his next door neighbor (Network engineer) noted that he wasn't swinging his arm and thought that he had a stroke. He subsequently saw [name] in about 2006 and he was dx with PD.  He was started on mirapex and azilect.  It helped the overall stiffness.  Tremor developed in the R arm around the same time as the diagnosis.  Levodopa was started in approximately 2009-2010.  He also started an exercise program and he believes that has made a huge difference.  He began to have some motor fluctuations and saw [name] and had his surgery done in 02/2013.  It was a unilateral implant, placed into the L STN.  He has noted that he is sleeping much better since surgery.  No tremor.  [name] programmed the DBS one week post op.  He f/u with the NP at [facility], and he now has an A and a B setting.  He hates the A setting (doesn't walk well) and he "cranked" his own B setting up to 280 (assuming frequency) and this helped walking some but he still has problems with balance and freezing.  He is on mirapex, 1 mg tid.  He is on carbidopa/levodopa 25/100, 7:30am/11am/2pm/6pm.    11/15/13:  Pt returns today for follow up.  Overall, he has been doing well.  He is off of medication this AM for programming.  His wife reports that he has done better on his new setting.  Seems to be walking better with less dyskinesia.  Until this morning, he was still on the carbidopa/levodopa 25/100, 1 tablet 4 times per day, Azilect 1 mg daily and Mirapex 1 mg 3 times per day.  He is exercising faithfully.  He has not had any falls.  He has been shuffling a little more this  morning, since he is off of medication.  11/21/13 update:  The patient is returning for followup, earlier than expected.  The patient reports that he really had not been doing as well over the weekend.  He did not feel like he was walking as well. He felt like the setting "wore" off 30 mins before the next levodopa dosage.  His wife wonders if he wasn't doing as well because he was at the beach this past weekend and there were more stairs to negotiate.  No falls.  No hallucinations   He did decrease his carbidopa/levodopa 25/100, one tablet 3 times per day from one tablet 4 times per day.  He is still on Mirapex 1 mg 3 times per day and Azilect 1 mg daily.  12/20/13 update:  The patient returns today for followup, accompanied by his wife and daughter who supplements the history.  The patient is currently on Mirapex, 1 mg 3 times a day, Azilect 1 mg daily and carbidopa/levodopa 25/100 at 7:30 AM/11 AM/2 PM and 6:30 PM.  Overall, the patient states that he is doing much better on his current setting.  It is not seeming to "wear off."  He is having some difficulty in the middle of the night when he gets  up and sometimes has to use a cane, but otherwise is doing well.  No falls.  On one occasion, he did try to get up in the morning and go and take a walk and was able to walk a few miles without taking his medication, but then he needed it when he got home.  He has noticed some dyskinesia, but it has not been particularly bothersome.  He would like to decrease dyskinesia, if able.  03/20/14 update:  Pt is f//u today.  Overall doing well.  He tried to decrease his carbidopa/levodopa 25/100 from one tablet 4 times per day to 1/0.5/0.5/1, but ended up going back up to one tablet 4 times per day.  He finds that he is due for his second dose at 11 AM, but by 10:30 AM he is shuffling, and he thinks it is primarily the right foot.  When he goes hiking, he states that he takes an extra half tablet at 10:30 AM and as well and  often takes another half tablet in the afternoon.  He finds that protein interferes with the levodopa dosing.  He continues to take 2 Flexeril at night.  He does not know if it helps, but has not tried to stop that since the DBS surgery.  He finds that the only time he has dyskinesia is at the end of the dose.  He asks me if we can slightly increase the DBS stimulation.  He continues to exercise.  No falls.  No hallucinations.  05/18/14 update: The patient is following up today, accompanied by his wife who supplements the history.  I tried to slightly decrease the levodopa, but the patient is was not able to do that.  He remains on carbidopa/levodopa 25/100, 1 tablet 4 times per day.  He is also on Mirapex 1 mg 3 times per day along with Azilect 1 mg daily. C/o some "slight" freezing episodes and med wearing off 1/2 hr before due but at the same time having frequent dyskinesia of the R leg.  No falls.  Staying very active.  No hallucinations.  C/o cost of azilect and asks about its benefit.    08/08/14 update:  Overall, the patient is doing well.  He has not taken medication since 6:30 this morning, and wanted me to see him off of medication.  He states that he has some "spotty freezing."  He asks about potentially taking metformin to help him.  He also asks about levodopa nasal spray.  He asks about several other experimental treatments.  He has occasional dyskinesia in the right foot but it is not bothersome to him.  Overall, he feels slightly slower than he used to.  He states that he generally is supposed to take his levodopa at 11 AM but he needs it by 10:30.  If he is very active he will need to take a few extra half dosages of levodopa throughout the day.  Generally, however, he takes 4 tablets of carbidopa/levodopa 25/100 throughout the day in addition to entacapone 200 mg twice a day and Mirapex 1 mg 3 times a day.  He is off of Azilect and wants to know if he should go back on it.  He noticed no  difference when discontinuing it.  He is off of Flexeril as well as noticed no trouble with that.  10/10/14 update:  Overall, the patient is doing well.  He is on carbidopa/levodopa 25/100 4 times per day.  He takes entacapone 200 mg with the  first and third dose of the levodopa.  He really does not notice that it helps and states that he still feels "jerky" 30 minutes before his next dosage is due.  He will often just take the levodopa and then.  He is on Mirapex 1 mg 3 times a day.  I added carbidopa/levodopa 50/200 CR at night because of cramping and the patient states that it helps but he rarely takes it because he thinks the dyskinesia that worse when he did it.  He states that he uses it if he really needs to "get up and go."  He does use Flexeril at night for cramping and thinks it helped as much as the carbidopa/levodopa 50/200 CR.  He did turn down the DBS device somewhat since last visit because of dyskinesia.  He states that he takes Azilect "off and on" because he was told by a previous neurologist that it was neuroprotective.  He asks me about isradapine.  PREVIOUS MEDICATIONS: Sinemet, Mirapex and azilect  ALLERGIES:   Allergies  Allergen Reactions  . Amoxicillin   . Celebrex [Celecoxib]   . Demerol Other (See Comments)    Hallucinations   . Penicillins     REACTION: RASH    CURRENT MEDICATIONS:  Current Outpatient Prescriptions on File Prior to Visit  Medication Sig Dispense Refill  . carbidopa-levodopa (SINEMET IR) 25-100 MG per tablet Take 1 tablet by mouth 4 (four) times daily. 120 tablet 5  . clonazePAM (KLONOPIN) 0.5 MG tablet Take 2 tablets (1 mg total) by mouth every evening. (Patient taking differently: Take 1 mg by mouth at bedtime as needed. ) 60 tablet 5  . cyclobenzaprine (FLEXERIL) 10 MG tablet Take 2 tablets (20 mg total) by mouth at bedtime. 60 tablet 3  . entacapone (COMTAN) 200 MG tablet Take 1 tablet (200 mg total) by mouth 2 (two) times daily. 60 tablet 5  .  pramipexole (MIRAPEX) 1 MG tablet TAKE 1 TABLET 3 TIMES DAILY 90 tablet 5  . rasagiline (AZILECT) 1 MG TABS tablet Take 1 tablet (1 mg total) by mouth daily. (Patient taking differently: Take 1 mg by mouth as needed. ) 70 tablet 0  . senna (SENOKOT) 8.6 MG tablet Take 1 tablet by mouth daily.     No current facility-administered medications on file prior to visit.    PAST MEDICAL HISTORY:   Past Medical History  Diagnosis Date  . GERD (gastroesophageal reflux disease) Pre 2002    Mild  . Ruptured appendix teens  . History of kidney stones   . 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 Parkinson disease 2004    Slowly progressive  . Arthritis     left hip     PAST SURGICAL HISTORY:   Past Surgical History  Procedure Laterality Date  . Neuro evaluation  12/19/2004    [name], benign tremor  . Doppler echocardiography  01/10/2009    LV NML Mild LVH EF 60-65% aortic sclerosis w/0 stenosis   . Appendectomy    . Joint replacement      left hip replacement 08/14/11/[facility]   . Total hip revision  08/27/2011    Procedure: TOTAL HIP REVISION;  Surgeon: Mauri Pole, MD;  Location: [facility];  Service: Orthopedics;  Laterality: Left;  . Deep brain stimulator placement  8/14    L STN    SOCIAL HISTORY:   History   Social History  .  Marital Status: Married    Spouse Name: N/A  . Number of Children: 2  . Years of Education: N/A   Occupational History  . Optometrist     medically retired   Social History Main Topics  . Smoking status: Never Smoker   . Smokeless tobacco: Never Used  . Alcohol Use: 3.5 oz/week    7 drink(s) per week     Comment: occassionally  . Drug Use: No  . Sexual Activity: Yes   Other Topics Concern  . Not on file   Social History Narrative   Married, lives with wife   2 daughters      Has living will   Wife has health care POA---then daughters   Would still accept  CPR--but no prolonged artificial means (ventilator or tube feeds)    FAMILY HISTORY:   Family Status  Relation Status Death Age  . Mother Deceased 15    knee replacement  . Father Deceased 57    tremor  . Sister Engineer, mining in [place], [place]    ROS:  A complete 10 system review of systems was obtained and was unremarkable apart from what is mentioned above.  PHYSICAL EXAMINATION:    VITALS:   Filed Vitals:   10/10/14 1055  BP: 178/88  Pulse: 84  Height: 6' (1.829 m)  Weight: 184 lb (83.462 kg)    GEN:  The patient appears stated age and is in NAD. HEENT:  Normocephalic, atraumatic.  The mucous membranes are moist. The superficial temporal arteries are without ropiness or tenderness.   Neurological examination:  Orientation: The patient is alert and oriented x3. Fund of knowledge is appropriate.  Recent and remote memory are intact.  Attention and concentration are normal.    Able to name objects and repeat phrases. Cranial nerves: There is good facial symmetry.  There is minimal facial hypomimia.  Pupils are equal round and reactive to light bilaterally.  The speech is slightly dysarthric but fluent. Soft palate rises symmetrically and there is no tongue deviation. Hearing is intact to conversational tone. Motor: Strength is 5/5 in the bilateral upper and lower extremities.   Shoulder shrug is equal and symmetric.  There is no pronator drift.  Movement examination: Tone: There is normal tone in the bilateral upper extremities.  The tone in the lower extremities is normal.  Abnormal movements: There is dyskinesia today in the RLE and upper extremity Coordination:  There is no significant decremation with RAM's Gait and Station: The patient has no difficulty arising out of a deep-seated chair and he "runs" down the hall and is short stepped but does pretty well.  He does have start hesitation.  DBS programming was performed today which is described in more detail on  a separate programming procedural notes.    ASSESSMENT/PLAN:  1. Parkinson's disease.    -The patient is status post left STN DBS at [facility] on 03/14/2013.  -remain on the carbidopa/levodopa 25/100,1 tablet qid and comtan 200 mg bid  -For now, he will remain on the Mirapex, 1 mg 3 times per day.    -d/c azilect again  -I did talk to patient re: diagnosis.  I would have loved to seen him early on in the disease.  By definition, patients with PD have to have sx's on the "good" side of the body within 3 years of diagnosis, and he has never had sx's on the opposite side.  This has really called into question, for me,  the diagnosis but I cannot deny that he has done much better post DBS  -Talked to him about the experimental therapies that he mentioned to me.  Do not recommend isradipine until phase 3 trials completed  -He is exercising and I encouraged him to continue to do so.  Info on [employer] given.   2.  I will plan to see him back in the next few months, sooner should new neurologic issues arise.

## 2014-10-22 ENCOUNTER — Telehealth: Payer: Self-pay | Admitting: Neurology

## 2014-10-22 MED ORDER — AMANTADINE HCL 100 MG PO CAPS: 100.0000 mg | ORAL_CAPSULE | Freq: Three times a day (TID) | ORAL | Status: DC

## 2014-10-22 NOTE — Telephone Encounter (Signed)
Yes.  100 mg tid

## 2014-10-22 NOTE — Telephone Encounter (Signed)
I didn't call patient. We have already spoken.

## 2014-10-22 NOTE — Telephone Encounter (Signed)
Pt called wanting to speak to a nurse regarding his script for losartan. Please call pt # losartan

## 2014-10-22 NOTE — Telephone Encounter (Signed)
Spoke with patient and he states he had spoken with his pharmacist who had told him that low dose losartan (half of a 25 mg tablet) could help with his dyskinesia. He wants to know your opinion and if that is something that you would start him on at this point?

## 2014-10-22 NOTE — Telephone Encounter (Signed)
He states he has not tried amantadine, but would be willing to try it. Would you like me to send in a prescription?

## 2014-10-22 NOTE — Telephone Encounter (Signed)
Pt called/returning your call at 1:58PM. C/b [phone]

## 2014-10-22 NOTE — Telephone Encounter (Signed)
Has he been on amantadine?

## 2014-10-22 NOTE — Telephone Encounter (Signed)
RX sent to patients pharmacy

## 2014-10-24 DIAGNOSIS — D225 Melanocytic nevi of trunk: Secondary | ICD-10-CM | POA: Diagnosis not present

## 2014-10-24 DIAGNOSIS — D485 Neoplasm of uncertain behavior of skin: Secondary | ICD-10-CM | POA: Diagnosis not present

## 2014-10-24 DIAGNOSIS — L570 Actinic keratosis: Secondary | ICD-10-CM | POA: Diagnosis not present

## 2014-10-30 DIAGNOSIS — H2510 Age-related nuclear cataract, unspecified eye: Secondary | ICD-10-CM | POA: Diagnosis not present

## 2014-10-30 DIAGNOSIS — H3531 Nonexudative age-related macular degeneration: Secondary | ICD-10-CM | POA: Diagnosis not present

## 2014-11-11 NOTE — Discharge Summary (Signed)
PATIENT NAME:  Casey Tucker, Casey Tucker MR#:  [id number] DATE OF BIRTH:  [birth year]  DATE OF ADMISSION:  08/14/2011 DATE OF DISCHARGE:  08/18/2011  DISCHARGE DIAGNOSES:  1. Severe avascular necrosis and degenerative arthritis, left hip.  2. Parkinson's disease.  3. Gastroesophageal reflux disease.   OPERATIONS/PROCEDURES PERFORMED: Left total hip replacement on 08/14/2011.   HISTORY AND PHYSICAL EXAMINATION: As written on admission.   LABORATORY DATA: As noted in the chart.   HOSPITAL COURSE: The patient was thoroughly evaluated preoperatively by Dr. [name], his regular medical doctor, and his notes are as included within the record. He was given medical clearance and on 08/14/2011 he was taken to the Operating Room where total hip replacement was performed. It should be noted at surgery that he had severe inflammatory fluid present within the joint and in my opinion this caused erosion of the superior acetabulum such that the superior acetabulum was incompetent. Three different sets of cultures were sent and all returned negative, Gram stains were negative and it was felt at the time of surgery that we could proceed. To alleviate the incompetency of the superior acetabulum bone grafting from the grated reamings of the acetabulum were packed superiorly. Total hip replacement was then performed and no complications were noted. Postoperatively, the patient did quite well. He did have a hematocrit of 26 on the first postoperative day, but this gradually improved day by day and he was placed on iron. He was advanced in physical therapy to total hip precautions on transfers and he is to be strictly toe-touch weight-bearing only with the walker because of the acetabulum issue described above. His orders are as written on the printed order sheet. Total hip precautions are to be maintained at all times. He is to continue using his thigh high TED stockings. He is to       be on Lovenox 40 mg  subcutaneously daily as prophylaxis for venous thrombosis and pulmonary embolism. He is given a return appointment to see Dr. Tamala Julian in 10 days for staple removal and x-ray. ____________________________ Lucas Mallow, MD ces:slb D: 08/18/2011 13:00:29 ET T: 08/18/2011 13:13:07 ET JOB#: [id number]  cc: Lucas Mallow, MD, <Dictator> Lucas Mallow MD ELECTRONICALLY SIGNED 08/27/2011 13:46

## 2014-11-11 NOTE — Op Note (Signed)
PATIENT NAME:  Casey Tucker, Casey Tucker MR#:  [id number] DATE OF BIRTH:  1941-06-03  DATE OF PROCEDURE:  08/14/2011  PREOPERATIVE DIAGNOSIS: Severe avascular necrosis and degenerative arthritis, left hip.   POSTOPERATIVE DIAGNOSIS: Severe avascular necrosis and degenerative arthritis, left hip.   PROCEDURE: Left total hip arthroplasty.   SURGEON: Christophe Louis, M.D.   ASSISTANT: Thornton Park, M.D.   ANESTHESIA: Spinal.   COMPLICATIONS: None.   ESTIMATED BLOOD LOSS: 600 mL.   DESCRIPTION OF PROCEDURE:  One gram of Ancef  and one gram of vancomycin were given intravenously prior to the procedure. Spinal anesthesia is induced. The patient is placed into the right lateral decubitus position in the usual manner and secured with the hip grips for left hip surgery. The left hip is thoroughly prepped with alcohol and ChloraPrep and draped in standard sterile fashion. Prior to applying the [place], the area of the skin incision also is again wiped with DuraPrep and then the [place] is applied. A standard posterolateral incision is made at the hip and the dissection carried down to the fascia lata which is incised in the line of the skin incision. The trochanteric bursa is excised. The sciatic nerve is palpated deep within the wound and is protected throughout the case. The piriformis tendon is identified, cut, and tagged. The remainder of the external rotators are then carefully cut with the coagulation Bovie. Hemostasis is maintained at all times with the coagulation Bovie. During the course of incising the external rotator muscles the capsule was entered and a large amount of yellow clear joint fluid came from the joint. This did not appear cloudy and did not appear purulent. The specimen was sent to the lab for Gram stain, crystal analysis, and anaerobic and aerobic cultures. The capsule is cut in a T fashion and the ends are tagged with 0 Ethibond sutures. The hip was rather easily dislocated. The head is  deformed from the avascular necrosis as per the preop x-ray.  The femoral head and neck is cut off at the appropriate angle and length and the femoral head removed. The acetabulum is thoroughly debrided of all soft tissue. Inferior osteophytes are removed from the acetabulum. The acetabulum is carefully palpated and the roof of the acetabulum surprisingly is thin and is fractured off approximately 25% on the superior surface. In addition, in excising the labrum there is seen to be approximately 2 mL of grainy, particulate fluid which comes from the acetabulum superior to the cortex. This also was sent to the lab for Gram stain, culture, and crystals. Gram stains returned negative for any organisms. The fluid coming from the acetabulum in my opinion was felt to represent inflammatory reaction in the bone to the severe inflammation which was present within the joint. Given the fact that the two areas of fluid formation did not appear to be clinically purulent and the Gram stains were negative, it was felt safe to proceed with the procedure. The Hohmann retractors were placed to expose the acetabulum properly. This was then serially reamed up to 53 mm. A 54-mm trial acetabulum was put into position and was seen to be an excellent fit. The acetabulum was thoroughly irrigated multiple times. The 54-mm Tri-Spike AML acetabulum is then impacted into place following bone grafting of the superior acetabulum using all of the gratings from the acetabular reamer. The acetabulum is seen to be extremely solid and there is no evidence of loosening. Trial acetabulum is then screwed into place. The proximal femur is then reamed  in standard fashion and reamed up to a 14.5 width, at which point there was good cortical contact. The proximal femur is then broached up to a 15 small and this is seen to be satisfactory. The #5 hip ball and neck are then put into place. A trial reduction demonstrates excellent leg lengths and good stability  in all directions on range of motion. Trial components are then all removed. The joint is then thoroughly irrigated multiple times with the pulsatile lavage. The polyethylene component is then impacted into place with the 4-degree elevation superiorly and somewhat posteriorly. The porous-coated 15 small AML prosthesis is then impacted into place and is seen to be an excellent solid fit. The #5 ball is impacted onto the trunnion and then the hip is reduced and is seen to be stable in all directions with excellent leg length. The wound is thoroughly irrigated multiple times. No drain was felt necessary. Fascia lata is closed with 0 Ethibond. Subcutaneous tissue is closed with 3-0 Vicryl and 2-0 Vicryl and the skin is closed with the skin stapler. A soft bulky dressing is applied. The patient is turned over onto the hospital bed and the abduction pillow applied. The patient is returned to the recovery room in satisfactory condition having tolerated the procedure quite well.    ____________________________ Lucas Mallow, MD ces:bjt D: 08/15/2011 11:18:57 ET T: 08/15/2011 12:18:20 ET JOB#: [id number]  cc: Lucas Mallow, MD, <Dictator> Lucas Mallow MD ELECTRONICALLY SIGNED 08/16/2011 9:17

## 2014-11-19 ENCOUNTER — Other Ambulatory Visit: Payer: Self-pay | Admitting: Neurology

## 2014-11-19 NOTE — Telephone Encounter (Signed)
Comtan refill requested. Per last office note- patient to remain on medication. Refill approved and sent to patient's pharmacy.

## 2014-11-23 ENCOUNTER — Telehealth: Payer: Self-pay | Admitting: Neurology

## 2014-11-23 NOTE — Telephone Encounter (Signed)
Spoke with patient and he states Amantadine is working well for him. He is having trouble with constipation. Information below given to him:  1.Rancho recipe for constipation in Parkinsons Disease:  -1 cup of bran, 2 cups of applesauce in 1 cup of prune juice 2.  Increase fiber intake (Metamucil,vegetables) 3.  Regular, moderate exercise can be beneficial. 4.  Avoid medications causing constipation, such as medications like antacids with calcium or magnesium 5.  Laxative overuse should be avoided. 6.  Stool softeners (Colace) can help with chronic constipation.  He also asked if using a stationary bike was a good idea and I advised that it was. He will call with any questions or problems.

## 2014-12-11 ENCOUNTER — Other Ambulatory Visit: Payer: Self-pay | Admitting: Neurology

## 2014-12-11 ENCOUNTER — Telehealth: Payer: Self-pay | Admitting: Neurology

## 2014-12-11 NOTE — Telephone Encounter (Signed)
Pt called in regards to his medication and would like a call back at [phone]

## 2014-12-11 NOTE — Telephone Encounter (Signed)
Left message on machine for patient to call back. Made him aware clonazepam RX was sent to the pharmacy earlier.

## 2014-12-11 NOTE — Telephone Encounter (Signed)
Clonazepam refill requested. Per last office note- patient to remain on medication. Refill approved and sent to patient's pharmacy.   

## 2014-12-14 ENCOUNTER — Other Ambulatory Visit: Payer: Self-pay | Admitting: Neurology

## 2014-12-14 NOTE — Telephone Encounter (Signed)
Rx sent 

## 2014-12-18 ENCOUNTER — Telehealth: Payer: Self-pay | Admitting: Neurology

## 2014-12-18 NOTE — Telephone Encounter (Signed)
Spoke with patient and he states he has had a lot of episodes of freezing about 30 minutes before he is due for his medication. Freezing only lasts a few seconds, but happening at least four times a day. He is unsure if he needs a DBS adjustment or if he should be taking his medications differently. He is taking medications as follows:  7:30 am - Comtan, Levodopa, Mirapex and Amantadine 11:00 am - Levodopa 1:30/2:00 pm - Comtan, Levodopa, Mirapex and Amantadine 6:00 pm - Levodopa, Mirapex and Amantadine  Please advise.

## 2014-12-18 NOTE — Telephone Encounter (Signed)
Have him try 1.5 tabs of the levodopa at the first and third dosages.

## 2014-12-18 NOTE — Telephone Encounter (Signed)
Patient made aware. He will try this and let us know how he does.

## 2014-12-18 NOTE — Telephone Encounter (Signed)
Pt called and would like a call back, he was a little hard to understand so I am not sure what he wanted other than to come see Dr Tat today/Dawn

## 2014-12-31 DIAGNOSIS — M7041 Prepatellar bursitis, right knee: Secondary | ICD-10-CM | POA: Diagnosis not present

## 2015-01-12 ENCOUNTER — Other Ambulatory Visit: Payer: Self-pay | Admitting: Neurology

## 2015-01-23 ENCOUNTER — Telehealth: Payer: Self-pay | Admitting: Neurology

## 2015-01-23 NOTE — Telephone Encounter (Signed)
Its not available (it is inhaled levodopa) and pts with prior DBS are excluded from the trial

## 2015-01-23 NOTE — Telephone Encounter (Signed)
Do you know what this means?

## 2015-01-23 NOTE — Telephone Encounter (Signed)
Pt called and wanted to know if he could participate in the 3rd phase trial or if the medication CVT-301 is available yet/Dawn CB# [phone]

## 2015-01-23 NOTE — Telephone Encounter (Signed)
Patient made aware.

## 2015-02-09 ENCOUNTER — Other Ambulatory Visit: Payer: Self-pay | Admitting: Neurology

## 2015-02-11 NOTE — Telephone Encounter (Signed)
Carbidopa Levodopa 25/100 refill requested. Per last office note- patient to remain on medication. Refill approved and sent to patient's pharmacy.   

## 2015-02-12 ENCOUNTER — Encounter: Payer: Self-pay | Admitting: Neurology

## 2015-02-12 ENCOUNTER — Ambulatory Visit (INDEPENDENT_AMBULATORY_CARE_PROVIDER_SITE_OTHER): Payer: Medicare Other | Admitting: Neurology

## 2015-02-12 VITALS — BP 152/72 | HR 80 | Ht 72.0 in | Wt 174.0 lb

## 2015-02-12 DIAGNOSIS — Z9689 Presence of other specified functional implants: Secondary | ICD-10-CM

## 2015-02-12 DIAGNOSIS — G20 Parkinson's disease: Secondary | ICD-10-CM | POA: Diagnosis not present

## 2015-02-12 DIAGNOSIS — Z9889 Other specified postprocedural states: Secondary | ICD-10-CM

## 2015-02-12 DIAGNOSIS — G249 Dystonia, unspecified: Secondary | ICD-10-CM | POA: Diagnosis not present

## 2015-02-12 MED ORDER — CARBIDOPA-LEVODOPA 25-100 MG PO TABS: 1.0000 | ORAL_TABLET | Freq: Every day | ORAL | Status: DC

## 2015-02-12 NOTE — Progress Notes (Signed)
1`   Casey Tucker was seen today in the movement disorders clinic for neurologic consultation at the request of [name], MD.  The consultation is for the evaluation of PD.  The records that were made available to me were reviewed.  Pt is accompanied by his wife who supplements the history.   Pt reports first sx was in 2006.  Pt is R hand dominant.  Pt states that his next door neighbor (Network engineer) noted that he wasn't swinging his arm and thought that he had a stroke. He subsequently saw [name] in about 2006 and he was dx with PD.  He was started on mirapex and azilect.  It helped the overall stiffness.  Tremor developed in the R arm around the same time as the diagnosis.  Levodopa was started in approximately 2009-2010.  He also started an exercise program and he believes that has made a huge difference.  He began to have some motor fluctuations and saw [name] and had his surgery done in 02/2013.  It was a unilateral implant, placed into the L STN.  He has noted that he is sleeping much better since surgery.  No tremor.  [name] programmed the DBS one week post op.  He f/u with the NP at [facility], and he now has an A and a B setting.  He hates the A setting (doesn't walk well) and he "cranked" his own B setting up to 280 (assuming frequency) and this helped walking some but he still has problems with balance and freezing.  He is on mirapex, 1 mg tid.  He is on carbidopa/levodopa 25/100, 7:30am/11am/2pm/6pm.    11/15/13:  Pt returns today for follow up.  Overall, he has been doing well.  He is off of medication this AM for programming.  His wife reports that he has done better on his new setting.  Seems to be walking better with less dyskinesia.  Until this morning, he was still on the carbidopa/levodopa 25/100, 1 tablet 4 times per day, Azilect 1 mg daily and Mirapex 1 mg 3 times per day.  He is exercising faithfully.  He has not had any falls.  He has been shuffling a little more this  morning, since he is off of medication.  11/21/13 update:  The patient is returning for followup, earlier than expected.  The patient reports that he really had not been doing as well over the weekend.  He did not feel like he was walking as well. He felt like the setting "wore" off 30 mins before the next levodopa dosage.  His wife wonders if he wasn't doing as well because he was at the beach this past weekend and there were more stairs to negotiate.  No falls.  No hallucinations   He did decrease his carbidopa/levodopa 25/100, one tablet 3 times per day from one tablet 4 times per day.  He is still on Mirapex 1 mg 3 times per day and Azilect 1 mg daily.  12/20/13 update:  The patient returns today for followup, accompanied by his wife and daughter who supplements the history.  The patient is currently on Mirapex, 1 mg 3 times a day, Azilect 1 mg daily and carbidopa/levodopa 25/100 at 7:30 AM/11 AM/2 PM and 6:30 PM.  Overall, the patient states that he is doing much better on his current setting.  It is not seeming to "wear off."  He is having some difficulty in the middle of the night when he gets  up and sometimes has to use a cane, but otherwise is doing well.  No falls.  On one occasion, he did try to get up in the morning and go and take a walk and was able to walk a few miles without taking his medication, but then he needed it when he got home.  He has noticed some dyskinesia, but it has not been particularly bothersome.  He would like to decrease dyskinesia, if able.  03/20/14 update:  Pt is f//u today.  Overall doing well.  He tried to decrease his carbidopa/levodopa 25/100 from one tablet 4 times per day to 1/0.5/0.5/1, but ended up going back up to one tablet 4 times per day.  He finds that he is due for his second dose at 11 AM, but by 10:30 AM he is shuffling, and he thinks it is primarily the right foot.  When he goes hiking, he states that he takes an extra half tablet at 10:30 AM and as well and  often takes another half tablet in the afternoon.  He finds that protein interferes with the levodopa dosing.  He continues to take 2 Flexeril at night.  He does not know if it helps, but has not tried to stop that since the DBS surgery.  He finds that the only time he has dyskinesia is at the end of the dose.  He asks me if we can slightly increase the DBS stimulation.  He continues to exercise.  No falls.  No hallucinations.  05/18/14 update: The patient is following up today, accompanied by his wife who supplements the history.  I tried to slightly decrease the levodopa, but the patient is was not able to do that.  He remains on carbidopa/levodopa 25/100, 1 tablet 4 times per day.  He is also on Mirapex 1 mg 3 times per day along with Azilect 1 mg daily. C/o some "slight" freezing episodes and med wearing off 1/2 hr before due but at the same time having frequent dyskinesia of the R leg.  No falls.  Staying very active.  No hallucinations.  C/o cost of azilect and asks about its benefit.    08/08/14 update:  Overall, the patient is doing well.  He has not taken medication since 6:30 this morning, and wanted me to see him off of medication.  He states that he has some "spotty freezing."  He asks about potentially taking metformin to help him.  He also asks about levodopa nasal spray.  He asks about several other experimental treatments.  He has occasional dyskinesia in the right foot but it is not bothersome to him.  Overall, he feels slightly slower than he used to.  He states that he generally is supposed to take his levodopa at 11 AM but he needs it by 10:30.  If he is very active he will need to take a few extra half dosages of levodopa throughout the day.  Generally, however, he takes 4 tablets of carbidopa/levodopa 25/100 throughout the day in addition to entacapone 200 mg twice a day and Mirapex 1 mg 3 times a day.  He is off of Azilect and wants to know if he should go back on it.  He noticed no  difference when discontinuing it.  He is off of Flexeril as well as noticed no trouble with that.  10/10/14 update:  Overall, the patient is doing well.  He is on carbidopa/levodopa 25/100 4 times per day.  He takes entacapone 200 mg with the  first and third dose of the levodopa.  He really does not notice that it helps and states that he still feels "jerky" 30 minutes before his next dosage is due.  He will often just take the levodopa and then.  He is on Mirapex 1 mg 3 times a day.  I added carbidopa/levodopa 50/200 CR at night because of cramping and the patient states that it helps but he rarely takes it because he thinks the dyskinesia that worse when he did it.  He states that he uses it if he really needs to "get up and go."  He does use Flexeril at night for cramping and thinks it helped as much as the carbidopa/levodopa 50/200 CR.  He did turn down the DBS device somewhat since last visit because of dyskinesia.  He states that he takes Azilect "off and on" because he was told by a previous neurologist that it was neuroprotective.  He asks me about isradapine.  02/12/15 update:  The patient returns today for follow-up.  I have communicated with him several times since last visit.  He called with complaints of freezing or before his next dose and we ended up slightly increasing his levodopa so that he is taking 1-1/2 tablets in the morning, one tablet at his next dose, 1-1/2 tablets in the mid afternoon and then 1 tablet in the later evening.  This helped but he states that sometimes he will take 1 in the AM and the 1/2 an hour or 2 later.  He still only takes 3-4 a day.  He takes entacapone with every other dose of the levodopa.  He is on pramipexole 1 mg 3 times per day.  Dyskinesia has been problematic despite the fact he complains about freezing and we ended up adding amantadine, 100 mg 3 times a day for that. It helped that but caused constipation and dry mouth.   He had 2 falls since last visit, one  of which was at the outer edge of my office when he came to bring me something.  He quickly turned around while carrying something and fell to his knee.  He did not get hurt.   He states that if he gets in a crowded space his feet will just get tangled together.   He does know that if he gets into "shuffle mode" he needs to stop and "reset."  He states overall shuffling is much better.  He asked me about several studies since our last visit, but these are not available to him because of the fact he has already had DBS.  He asks me again about nilotinib study.  PREVIOUS MEDICATIONS: Sinemet, Mirapex and azilect  ALLERGIES:   Allergies  Allergen Reactions  . Amoxicillin   . Celebrex [Celecoxib]   . Demerol Other (See Comments)    Hallucinations   . Penicillins     REACTION: RASH    CURRENT MEDICATIONS:  Current Outpatient Prescriptions on File Prior to Visit  Medication Sig Dispense Refill  . amantadine (SYMMETREL) 100 MG capsule TAKE ONE CAPSULE THREE TIMES A DAY 90 capsule 11  . clonazePAM (KLONOPIN) 0.5 MG tablet TAKE TWO TABLETS EVERY EVENING 60 tablet 5  . cyclobenzaprine (FLEXERIL) 10 MG tablet TAKE TWO TABLETS AT BEDTIME 60 tablet 3  . entacapone (COMTAN) 200 MG tablet TAKE ONE TABLET TWICE A DAY 60 tablet 5  . pramipexole (MIRAPEX) 1 MG tablet TAKE 1 TABLET 3 TIMES DAILY 90 tablet 5  . senna (SENOKOT) 8.6 MG tablet  Take 1 tablet by mouth daily.     No current facility-administered medications on file prior to visit.    PAST MEDICAL HISTORY:   Past Medical History  Diagnosis Date  . GERD (gastroesophageal reflux disease) Pre 2002    Mild  . Ruptured appendix teens  . History of kidney stones   . 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 Parkinson disease 2004    Slowly progressive  . Arthritis     left hip     PAST SURGICAL HISTORY:   Past Surgical History  Procedure Laterality Date  .  Neuro evaluation  12/19/2004    [name], benign tremor  . Doppler echocardiography  01/10/2009    LV NML Mild LVH EF 60-65% aortic sclerosis w/0 stenosis   . Appendectomy    . Joint replacement      left hip replacement 08/14/11/[facility]   . Total hip revision  08/27/2011    Procedure: TOTAL HIP REVISION;  Surgeon: Mauri Pole, MD;  Location: [facility];  Service: Orthopedics;  Laterality: Left;  . Deep brain stimulator placement  8/14    L STN    SOCIAL HISTORY:   History   Social History  . Marital Status: Married    Spouse Name: N/A  . Number of Children: 2  . Years of Education: N/A   Occupational History  . Optometrist     medically retired   Social History Main Topics  . Smoking status: Never Smoker   . Smokeless tobacco: Never Used  . Alcohol Use: 3.5 oz/week    7 drink(s) per week     Comment: occassionally  . Drug Use: No  . Sexual Activity: Yes   Other Topics Concern  . Not on file   Social History Narrative   Married, lives with wife   2 daughters      Has living will   Wife has health care POA---then daughters   Would still accept CPR--but no prolonged artificial means (ventilator or tube feeds)    FAMILY HISTORY:   Family Status  Relation Status Death Age  . Mother Deceased 57    knee replacement  . Father Deceased 11    tremor  . Sister Engineer, mining in [place]    ROS:  A complete 10 system review of systems was obtained and was unremarkable apart from what is mentioned above.  PHYSICAL EXAMINATION:    VITALS:   Filed Vitals:   02/12/15 1046  BP: 152/72  Pulse: 80  Height: 6' (1.829 m)  Weight: 174 lb (78.926 kg)    GEN:  The patient appears stated age and is in NAD. HEENT:  Normocephalic, atraumatic.  The mucous membranes are moist. The superficial temporal arteries are without ropiness or tenderness.   Neurological examination:  Orientation: The patient is alert and oriented x3. Fund of knowledge is appropriate.   Recent and remote memory are intact.  Attention and concentration are normal.    Able to name objects and repeat phrases. Cranial nerves: There is good facial symmetry.  There is minimal facial hypomimia.  Pupils are equal round and reactive to light bilaterally.  The speech is slightly dysarthric but fluent. Soft palate rises symmetrically and there is no tongue deviation. Hearing is intact to conversational tone. Motor: Strength is 5/5 in the bilateral upper and lower extremities.   Shoulder shrug is equal and symmetric.  There is no pronator drift.  Movement examination: Tone: There is normal tone in the bilateral upper extremities.  The tone in the lower extremities is normal.  Abnormal movements: There is dyskinesia today in the RLE Coordination:  There is mild difficulty with finger taps bilaterally and alternation of supination/pronation of forearm bilaterally Gait and Station: The patient has no difficulty arising out of a deep-seated chair and he "runs" down the hall and is short stepped but does pretty well.  He does have start hesitation.  DBS programming was performed today which is described in more detail on a separate programming procedural notes.    ASSESSMENT/PLAN:  1. Parkinson's disease.    -The patient is status post left STN DBS at [facility] on 03/14/2013.  -remain on the carbidopa/levodopa 25/100, (1.5/1/1.5/1) and comtan 200 mg bid  -For now, he will remain on the Mirapex, 1 mg 3 times per day and amantadine 100 mg tid, which is helping dyskinesia  -I did talk to patient re: diagnosis.  I would have loved to seen him early on in the disease.  By definition, patients with PD have to have sx's on the "good" side of the body within 3 years of diagnosis, and he has never had sx's on the opposite side.  This has really called into question, for me, the diagnosis but I cannot deny that he has done much better post DBS  -Talked to him again about the experimental therapies that he  mentioned to me.  Not a candidate for several of the studies that he mentioned because he is status post DBS.  Talked to him about the nilotinib study and not sure it is enrolling right now, but don't recommend for him.  -Will do MRI brain with DBS protocol just to make sure that the position of the leads themselves is not contributing to dyskinesia.  Leads that are too inferior can contribute to this.   2.  Constipation  -Was made worse by amantadine.  Talked about the regular use of the rancho recipe.  Talked about the use of Colace.  He can also use MiraLAX as needed, although not necessarily regularly. 3.  Much greater than 50% of the 40 minute visit was in counseling and coordinating care, as above.

## 2015-02-12 NOTE — Procedures (Signed)
DBS Programming was performed.    Total time spent programming was 20 minutes.  Device was confirmed to be on.  Soft start was confirmed to be on.  Impedences were checked and were within normal limits.  Battery was checked and was determined to be functioning normally and not near the end of life.  Final settings were as follows:  Left brain electrode, Group A:     2-C+; Amplitude  3.5   V   ; Pulse width 60 microseconds;   Frequency   140   Hz.  Left brain electrode, group B:  1-C+, amplitude: 3.6 V, PW: 60, frequency 140  Group B was active at the time pt left the office.  Right brain electrode:     n/a

## 2015-02-13 ENCOUNTER — Ambulatory Visit: Payer: BLUE CROSS/BLUE SHIELD | Admitting: Neurology

## 2015-02-20 ENCOUNTER — Telehealth: Payer: Self-pay | Admitting: Neurology

## 2015-02-20 NOTE — Telephone Encounter (Signed)
Is this safe to schedule?

## 2015-02-20 NOTE — Telephone Encounter (Signed)
Pt called concerning possible MRI/ call back # [phone] or [phone]

## 2015-02-21 ENCOUNTER — Other Ambulatory Visit: Payer: Self-pay | Admitting: Neurology

## 2015-02-21 NOTE — Telephone Encounter (Signed)
Appt set up with patient's wife. He will call back if date/time doesn't work.

## 2015-02-21 NOTE — Telephone Encounter (Signed)
I just talked with [employer] rep and I am going to need to get more info off his device.  I may have time Monday before planning?

## 2015-02-22 NOTE — Telephone Encounter (Signed)
Mirapex refill requested. Per last office note- patient to remain on medication. Refill approved and sent to patient's pharmacy.   

## 2015-02-25 ENCOUNTER — Ambulatory Visit (INDEPENDENT_AMBULATORY_CARE_PROVIDER_SITE_OTHER): Payer: Medicare Other | Admitting: Neurology

## 2015-02-25 ENCOUNTER — Encounter: Payer: Self-pay | Admitting: Neurology

## 2015-02-25 VITALS — BP 120/60 | HR 76 | Ht 73.0 in

## 2015-02-25 DIAGNOSIS — G249 Dystonia, unspecified: Secondary | ICD-10-CM

## 2015-02-25 DIAGNOSIS — G20 Parkinson's disease: Secondary | ICD-10-CM | POA: Diagnosis not present

## 2015-02-25 DIAGNOSIS — R279 Unspecified lack of coordination: Secondary | ICD-10-CM

## 2015-02-25 NOTE — Progress Notes (Signed)
Here for procedure visit only

## 2015-02-25 NOTE — Addendum Note (Signed)
Addended byAnnamaria Helling on: 02/25/2015 01:06 PM   Modules accepted: Orders

## 2015-02-25 NOTE — Procedures (Signed)
DBS Programming was performed.    Total time spent reviewing programming was 15 minutes.  Device was confirmed to be on.  Soft start was confirmed to be on.  Impedences were checked and were within normal limits.  These were checked on all contacts as the patient is scheduled to undergo postop MRI.  All impedances were well within normal limits and are recorded on a separate worksheet.  Battery was checked and was determined to be functioning normally and not near the end of life.  Final settings were as follows:  Left brain electrode, Group A:     2-C+; Amplitude  3.5   V   ; Pulse width 60 microseconds;   Frequency   140   Hz.  Left brain electrode, group B:  1-C+, amplitude: 3.6 V, PW: 60, frequency 140  Group B was active at the time pt left the office.  Right brain electrode:     n/a

## 2015-02-26 ENCOUNTER — Telehealth: Payer: Self-pay | Admitting: Neurology

## 2015-02-26 NOTE — Telephone Encounter (Signed)
LMOM for patient to call back. To make him aware MR scheduled at [facility] on 03/06/15 to arrive at 3:45 pm. Awaiting call back.

## 2015-02-26 NOTE — Telephone Encounter (Signed)
Pt returned your call/ Call back @ [phone]

## 2015-02-26 NOTE — Telephone Encounter (Signed)
Left message on machine for patient to call back.

## 2015-02-26 NOTE — Telephone Encounter (Signed)
Patient made aware.

## 2015-03-06 ENCOUNTER — Ambulatory Visit (HOSPITAL_COMMUNITY)
Admission: RE | Admit: 2015-03-06 | Discharge: 2015-03-06 | Disposition: A | Discharge status: Home / Self Care | Payer: Medicare Other | Source: Ambulatory Visit | Attending: Neurology | Admitting: Neurology

## 2015-03-06 DIAGNOSIS — G20 Parkinson's disease: Secondary | ICD-10-CM | POA: Insufficient documentation

## 2015-03-06 DIAGNOSIS — G249 Dystonia, unspecified: Secondary | ICD-10-CM | POA: Diagnosis not present

## 2015-03-06 DIAGNOSIS — Z9689 Presence of other specified functional implants: Secondary | ICD-10-CM | POA: Diagnosis not present

## 2015-03-07 ENCOUNTER — Telehealth: Payer: Self-pay | Admitting: Neurology

## 2015-03-07 NOTE — Telephone Encounter (Signed)
-----   Message from Webb, DO sent at 03/07/2015  7:54 AM EDT ----- Tell pt that post op images look good.  Lead looks well placed.

## 2015-03-07 NOTE — Telephone Encounter (Signed)
Patient made aware of results.  

## 2015-03-14 ENCOUNTER — Telehealth: Payer: Self-pay | Admitting: Neurology

## 2015-03-14 NOTE — Telephone Encounter (Signed)
Left message on machine for patient to call back.

## 2015-03-14 NOTE — Telephone Encounter (Signed)
Pt called and said he is constipated/call back @ [phone] or [phone]

## 2015-03-14 NOTE — Telephone Encounter (Signed)
Spoke with patient is he is doing a stool softener and one tablespoon of rancho recipe daily. He is using Miralax intermittently. I advised him to increase rancho recipe to twice daily and take miralax daily. Make sure he is drinking plenty of water and remaining active. He will call with any further problems.

## 2015-03-28 ENCOUNTER — Encounter: Payer: Self-pay | Admitting: Internal Medicine

## 2015-03-28 ENCOUNTER — Ambulatory Visit (INDEPENDENT_AMBULATORY_CARE_PROVIDER_SITE_OTHER): Payer: Medicare Other | Admitting: Internal Medicine

## 2015-03-28 VITALS — BP 138/60 | HR 88 | Temp 98.3°F | Wt 172.0 lb

## 2015-03-28 DIAGNOSIS — R05 Cough: Secondary | ICD-10-CM

## 2015-03-28 DIAGNOSIS — R059 Cough, unspecified: Secondary | ICD-10-CM | POA: Insufficient documentation

## 2015-03-28 MED ORDER — PREDNISONE 20 MG PO TABS: 40.0000 mg | ORAL_TABLET | Freq: Every day | ORAL | Status: DC

## 2015-03-28 NOTE — Assessment & Plan Note (Signed)
Doesn't seem to be infection No PND or reflux May be inflammation/irritation in larynx/bronchial tubes Discussed trying to get more rest If not better in the next couple of days--will try short prednisone burst

## 2015-03-28 NOTE — Progress Notes (Signed)
Pre visit review using our clinic review tool, if applicable. No additional management support is needed unless otherwise documented below in the visit note. 

## 2015-03-28 NOTE — Progress Notes (Signed)
Subjective:    Patient ID: Casey Tucker, male    DOB: 02-02-1941, 74 y.o.   MRN: [id number]  HPI Here for evaluation of cough  Wife has been sick with pinched nerve Up at night helping her Resistance is down  Now with hacky cough Doesn't feel sick No fever No SOB Cough is dry No sinus congestion or headache No ear pain or sore throat  Current Outpatient Prescriptions on File Prior to Visit  Medication Sig Dispense Refill  . amantadine (SYMMETREL) 100 MG capsule TAKE ONE CAPSULE THREE TIMES A DAY 90 capsule 11  . carbidopa-levodopa (SINEMET IR) 25-100 MG per tablet Take 1 tablet by mouth 5 (five) times daily. 150 tablet 5  . clonazePAM (KLONOPIN) 0.5 MG tablet TAKE TWO TABLETS EVERY EVENING 60 tablet 5  . cyclobenzaprine (FLEXERIL) 10 MG tablet TAKE TWO TABLETS AT BEDTIME 60 tablet 3  . entacapone (COMTAN) 200 MG tablet TAKE ONE TABLET TWICE A DAY 60 tablet 5  . pramipexole (MIRAPEX) 1 MG tablet TAKE 1 TABLET 3 TIMES DAILY 90 tablet 5  . senna (SENOKOT) 8.6 MG tablet Take 1 tablet by mouth daily.     No current facility-administered medications on file prior to visit.    Allergies  Allergen Reactions  . Amoxicillin   . Celebrex [Celecoxib]   . Demerol Other (See Comments)    Hallucinations   . Penicillins     REACTION: RASH    Past Medical History  Diagnosis Date  . GERD (gastroesophageal reflux disease) Pre 2002    Mild  . Ruptured appendix teens  . History of kidney stones   . 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 Parkinson disease 2004    Slowly progressive  . Arthritis     left hip     Past Surgical History  Procedure Laterality Date  . Neuro evaluation  12/19/2004    [name], benign tremor  . Doppler echocardiography  01/10/2009    LV NML Mild LVH EF 60-65% aortic sclerosis w/0 stenosis   . Appendectomy    . Joint replacement      left hip replacement  08/14/11/[place]   . Total hip revision  08/27/2011    Procedure: TOTAL HIP REVISION;  Surgeon: Mauri Pole, MD;  Location: [facility];  Service: Orthopedics;  Laterality: Left;  . Deep brain stimulator placement  8/14    L [facility]    Family History  Problem Relation Age of Onset  . Arthritis Mother     knee replacement  . COPD Father     emphysema, smoker  . Heart disease Father     CHF  . Alcohol abuse Paternal Uncle     Social History   Social History  . Marital Status: Married    Spouse Name: N/A  . Number of Children: 2  . Years of Education: N/A   Occupational History  . Optometrist     medically retired   Social History Main Topics  . Smoking status: Never Smoker   . Smokeless tobacco: Never Used  . Alcohol Use: 3.5 oz/week    7 drink(s) per week     Comment: occassionally  . Drug Use: No  . Sexual Activity: Yes   Other Topics Concern  . Not on file   Social History Narrative   Married, lives with wife   2 daughters      Has living will  Wife has health care POA---then daughters   Would still accept CPR--but no prolonged artificial means (ventilator or tube feeds)   Review of Systems No heartburn No nasal drainage Appetite is okay    Objective:   Physical Exam  Constitutional: He appears well-developed and well-nourished. No distress.  HENT:  Mouth/Throat: Oropharynx is clear and moist. No oropharyngeal exudate.  No sinus tenderness Slight nasal swelling TMs normal  Neck: Normal range of motion. Neck supple. No thyromegaly present.  Pulmonary/Chest: Effort normal and breath sounds normal. No respiratory distress. He has no wheezes. He has no rales.  Lymphadenopathy:    He has no cervical adenopathy.          Assessment & Plan:

## 2015-04-02 DIAGNOSIS — H903 Sensorineural hearing loss, bilateral: Secondary | ICD-10-CM | POA: Diagnosis not present

## 2015-04-02 DIAGNOSIS — H6123 Impacted cerumen, bilateral: Secondary | ICD-10-CM | POA: Diagnosis not present

## 2015-04-02 DIAGNOSIS — G20 Parkinson's disease: Secondary | ICD-10-CM | POA: Diagnosis not present

## 2015-04-15 DIAGNOSIS — Z23 Encounter for immunization: Secondary | ICD-10-CM | POA: Diagnosis not present

## 2015-04-16 ENCOUNTER — Other Ambulatory Visit: Payer: Self-pay | Admitting: Neurology

## 2015-04-16 NOTE — Telephone Encounter (Signed)
Flexeril refill requested. Per last office note- patient to remain on medication. Refill approved and sent to patient's pharmacy.   

## 2015-04-24 DIAGNOSIS — L821 Other seborrheic keratosis: Secondary | ICD-10-CM | POA: Diagnosis not present

## 2015-04-24 DIAGNOSIS — X32XXXA Exposure to sunlight, initial encounter: Secondary | ICD-10-CM | POA: Diagnosis not present

## 2015-04-24 DIAGNOSIS — D225 Melanocytic nevi of trunk: Secondary | ICD-10-CM | POA: Diagnosis not present

## 2015-04-24 DIAGNOSIS — L570 Actinic keratosis: Secondary | ICD-10-CM | POA: Diagnosis not present

## 2015-04-24 DIAGNOSIS — D2239 Melanocytic nevi of other parts of face: Secondary | ICD-10-CM | POA: Diagnosis not present

## 2015-04-24 DIAGNOSIS — D692 Other nonthrombocytopenic purpura: Secondary | ICD-10-CM | POA: Diagnosis not present

## 2015-04-30 ENCOUNTER — Ambulatory Visit (INDEPENDENT_AMBULATORY_CARE_PROVIDER_SITE_OTHER): Payer: Medicare Other | Admitting: Internal Medicine

## 2015-04-30 ENCOUNTER — Encounter: Payer: Self-pay | Admitting: Internal Medicine

## 2015-04-30 VITALS — BP 148/60 | HR 83 | Temp 97.7°F | Ht 73.0 in | Wt 171.0 lb

## 2015-04-30 DIAGNOSIS — G20 Parkinson's disease: Secondary | ICD-10-CM | POA: Diagnosis not present

## 2015-04-30 DIAGNOSIS — Z0000 Encounter for general adult medical examination without abnormal findings: Secondary | ICD-10-CM | POA: Diagnosis not present

## 2015-04-30 DIAGNOSIS — I700 Atherosclerosis of aorta: Secondary | ICD-10-CM | POA: Diagnosis not present

## 2015-04-30 DIAGNOSIS — IMO0001 Reserved for inherently not codable concepts without codable children: Secondary | ICD-10-CM

## 2015-04-30 DIAGNOSIS — Z1211 Encounter for screening for malignant neoplasm of colon: Secondary | ICD-10-CM

## 2015-04-30 LAB — CBC WITH DIFFERENTIAL/PLATELET
BASOS PCT: 0.5 % (ref 0.0–3.0)
Basophils Absolute: 0 10*3/uL (ref 0.0–0.1)
EOS PCT: 5.1 % — AB (ref 0.0–5.0)
Eosinophils Absolute: 0.2 10*3/uL (ref 0.0–0.7)
HCT: 36.6 % — ABNORMAL LOW (ref 39.0–52.0)
HEMOGLOBIN: 12.3 g/dL — AB (ref 13.0–17.0)
LYMPHS PCT: 31.8 % (ref 12.0–46.0)
Lymphs Abs: 1 10*3/uL (ref 0.7–4.0)
MCHC: 33.6 g/dL (ref 30.0–36.0)
MCV: 110.4 fl — AB (ref 78.0–100.0)
Monocytes Absolute: 0.4 10*3/uL (ref 0.1–1.0)
Monocytes Relative: 13.6 % — ABNORMAL HIGH (ref 3.0–12.0)
NEUTROS ABS: 1.6 10*3/uL (ref 1.4–7.7)
Neutrophils Relative %: 49 % (ref 43.0–77.0)
Platelets: 285 10*3/uL (ref 150.0–400.0)
RBC: 3.32 Mil/uL — ABNORMAL LOW (ref 4.22–5.81)
RDW: 14.4 % (ref 11.5–15.5)
WBC: 3.3 10*3/uL — ABNORMAL LOW (ref 4.0–10.5)

## 2015-04-30 LAB — COMPREHENSIVE METABOLIC PANEL
ALT: 3 U/L (ref 0–53)
AST: 13 U/L (ref 0–37)
Albumin: 3.8 g/dL (ref 3.5–5.2)
Alkaline Phosphatase: 73 U/L (ref 39–117)
BUN: 16 mg/dL (ref 6–23)
CALCIUM: 8.6 mg/dL (ref 8.4–10.5)
CHLORIDE: 103 meq/L (ref 96–112)
CO2: 30 meq/L (ref 19–32)
Creatinine, Ser: 0.67 mg/dL (ref 0.40–1.50)
GFR: 123.39 mL/min (ref >60.00)
Glucose, Bld: 87 mg/dL (ref 70–99)
Potassium: 3.7 mEq/L (ref 3.5–5.1)
SODIUM: 138 meq/L (ref 135–145)
Total Bilirubin: 1.3 mg/dL — ABNORMAL HIGH (ref 0.2–1.2)
Total Protein: 6.3 g/dL (ref 6.0–8.3)

## 2015-04-30 MED ORDER — CLONAZEPAM 0.5 MG PO TABS: 1.0000 mg | ORAL_TABLET | Freq: Every day | ORAL | Status: DC

## 2015-04-30 NOTE — Progress Notes (Signed)
Pre visit review using our clinic review tool, if applicable. No additional management support is needed unless otherwise documented below in the visit note. 

## 2015-04-30 NOTE — Progress Notes (Signed)
Subjective:    Patient ID: Casey Tucker, male    DOB: Dec 27, 1940, 74 y.o.   MRN: [id number]  HPI Here for Medicare wellness and follow up of chronic medical conditions Reviewed form and advanced directives Reviewed other doctors Occasional drink of alcohol No tobacco Tries to exercise 1 fall this year--doing yard work. No injury No depression or anhedonia Still drives and does all instrumental ADLs No cognitive problems Vision fine Some hearing problems--seeing Dr Thornell Mule  Doing okay with his Parkinson's  Still satisfied with the DBS Some dry mouth with the amantadine--but overall satisfied  Uses the clonazepam to help sleep Also uses cyclobenzaprine (originally from Dr Erling Cruz) --has leg movement issues  No chest pain No SOB No dizziness or syncope recently No edema  Current Outpatient Prescriptions on File Prior to Visit  Medication Sig Dispense Refill  . amantadine (SYMMETREL) 100 MG capsule TAKE ONE CAPSULE THREE TIMES A DAY 90 capsule 11  . carbidopa-levodopa (SINEMET IR) 25-100 MG per tablet Take 1 tablet by mouth 5 (five) times daily. 150 tablet 5  . clonazePAM (KLONOPIN) 0.5 MG tablet TAKE TWO TABLETS EVERY EVENING 60 tablet 5  . cyclobenzaprine (FLEXERIL) 10 MG tablet TAKE TWO TABLETS AT BEDTIME 60 tablet 5  . entacapone (COMTAN) 200 MG tablet TAKE ONE TABLET TWICE A DAY 60 tablet 5  . pramipexole (MIRAPEX) 1 MG tablet TAKE 1 TABLET 3 TIMES DAILY 90 tablet 5  . senna (SENOKOT) 8.6 MG tablet Take 1 tablet by mouth daily.     No current facility-administered medications on file prior to visit.    Allergies  Allergen Reactions  . Amoxicillin   . Celebrex [Celecoxib]   . Demerol Other (See Comments)    Hallucinations   . Penicillins     REACTION: RASH    Past Medical History  Diagnosis Date  . GERD (gastroesophageal reflux disease) Pre 2002    Mild  . Ruptured appendix teens  . History of kidney stones   . 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 Parkinson disease ([place]) 2004    Slowly progressive  . Arthritis     left hip     Past Surgical History  Procedure Laterality Date  . Neuro evaluation  12/19/2004    [name], benign tremor  . Doppler echocardiography  01/10/2009    LV NML Mild LVH EF 60-65% aortic sclerosis w/0 stenosis   . Appendectomy    . Joint replacement      left hip replacement 08/14/11/[place]   . Total hip revision  08/27/2011    Procedure: TOTAL HIP REVISION;  Surgeon: Mauri Pole, MD;  Location: [facility];  Service: Orthopedics;  Laterality: Left;  . Deep brain stimulator placement  8/14    L STN    Family History  Problem Relation Age of Onset  . Arthritis Mother     knee replacement  . COPD Father     emphysema, smoker  . Heart disease Father     CHF  . Alcohol abuse Paternal Uncle     Social History   Social History  . Marital Status: Married    Spouse Name: N/A  . Number of Children: 2  . Years of Education: N/A   Occupational History  . Optometrist     medically retired   Social History Main Topics  . Smoking status: Never Smoker   . Smokeless tobacco: Never Used  .  Alcohol Use: 3.5 oz/week    7 drink(s) per week     Comment: occassionally  . Drug Use: No  . Sexual Activity: Yes   Other Topics Concern  . Not on file   Social History Narrative   Married, lives with wife   2 daughters      Has living will   Wife has health care POA---then daughters   Would still accept CPR--but no prolonged artificial means (ventilator or tube feeds)   Review of Systems Bowels are okay--uses stool softener daily No blood in stools Appetite is good Weight was way up--- but back down again Wears seat belt No problems with GERD. No dysphagia No back or joint pain Voids well. Nocturia x 2 and stable. No daytime problems No suspicious skin lesions--sees Dasher    Objective:   Physical Exam    Constitutional: He is oriented to person, place, and time. He appears well-developed and well-nourished. No distress.  HENT:  Mouth/Throat: Oropharynx is clear and moist. No oropharyngeal exudate.  Neck: Normal range of motion. Neck supple. No thyromegaly present.  Cardiovascular: Normal rate, regular rhythm and intact distal pulses.  Exam reveals no gallop.   Soft aortic systolic murmur  Pulmonary/Chest: Effort normal and breath sounds normal. No respiratory distress. He has no wheezes. He has no rales.  Abdominal: Soft. There is no tenderness.  Musculoskeletal: He exhibits no edema or tenderness.  Lymphadenopathy:    He has no cervical adenopathy.  Neurological: He is alert and oriented to person, place, and time.  President-- "Obama, Clinton---- " [phone] [id number] Recall 3/3  Skin: No rash noted. No erythema.  Psychiatric: He has a normal mood and affect. His behavior is normal.          Assessment & Plan:

## 2015-04-30 NOTE — Assessment & Plan Note (Signed)
No symptoms No testing needed

## 2015-04-30 NOTE — Assessment & Plan Note (Signed)
I have personally reviewed the Medicare Annual Wellness questionnaire and have noted 1. The patient's medical and social history 2. Their use of alcohol, tobacco or illicit drugs 3. Their current medications and supplements 4. The patient's functional ability including ADL's, fall risks, home safety risks and hearing or visual             impairment. 5. Diet and physical activities 6. Evidence for depression or mood disorders  The patients weight, height, BMI and visual acuity have been recorded in the chart I have made referrals, counseling and provided education to the patient based review of the above and I have provided the pt with a written personalized care plan for preventive services.  I have provided you with a copy of your personalized plan for preventive services. Please take the time to review along with your updated medication list.  Had flu shot No PSA due to age Will check fecal immunoassay Mild cognitive changes--- no functional issues though

## 2015-04-30 NOTE — Addendum Note (Signed)
Addended by: Despina Hidden on: 04/30/2015 12:47 PM   Modules accepted: Orders

## 2015-04-30 NOTE — Assessment & Plan Note (Signed)
Doing well with DBS and meds

## 2015-05-09 ENCOUNTER — Other Ambulatory Visit (INDEPENDENT_AMBULATORY_CARE_PROVIDER_SITE_OTHER): Payer: Medicare Other

## 2015-05-09 DIAGNOSIS — Z1211 Encounter for screening for malignant neoplasm of colon: Secondary | ICD-10-CM | POA: Diagnosis not present

## 2015-05-09 LAB — FECAL OCCULT BLOOD, IMMUNOCHEMICAL: Fecal Occult Bld: NEGATIVE

## 2015-05-13 ENCOUNTER — Other Ambulatory Visit: Payer: Self-pay | Admitting: Neurology

## 2015-05-13 NOTE — Telephone Encounter (Signed)
Comtan refill requested. Per last office note- patient to remain on medication. Refill approved and sent to patient's pharmacy.   

## 2015-05-15 ENCOUNTER — Ambulatory Visit (INDEPENDENT_AMBULATORY_CARE_PROVIDER_SITE_OTHER): Payer: Medicare Other | Admitting: Neurology

## 2015-05-15 ENCOUNTER — Encounter: Payer: Self-pay | Admitting: Neurology

## 2015-05-15 VITALS — BP 138/70 | HR 80 | Ht 73.0 in | Wt 175.0 lb

## 2015-05-15 DIAGNOSIS — G20 Parkinson's disease: Secondary | ICD-10-CM

## 2015-05-15 DIAGNOSIS — G249 Dystonia, unspecified: Secondary | ICD-10-CM

## 2015-05-15 DIAGNOSIS — Z9689 Presence of other specified functional implants: Secondary | ICD-10-CM

## 2015-05-15 NOTE — Progress Notes (Signed)
1`   Casey Tucker was seen today in the movement disorders clinic for neurologic consultation at the request of [name], MD.  The consultation is for the evaluation of PD.  The records that were made available to me were reviewed.  Pt is accompanied by his wife who supplements the history.   Pt reports first sx was in 2006.  Pt is R hand dominant.  Pt states that his next door neighbor (Network engineer) noted that he wasn't swinging his arm and thought that he had a stroke. He subsequently saw [name] in about 2006 and he was dx with PD.  He was started on mirapex and azilect.  It helped the overall stiffness.  Tremor developed in the R arm around the same time as the diagnosis.  Levodopa was started in approximately 2009-2010.  He also started an exercise program and he believes that has made a huge difference.  He began to have some motor fluctuations and saw [name] and had his surgery done in 02/2013.  It was a unilateral implant, placed into the L STN.  He has noted that he is sleeping much better since surgery.  No tremor.  [name] programmed the DBS one week post op.  He f/u with the NP at [facility], and he now has an A and a B setting.  He hates the A setting (doesn't walk well) and he "cranked" his own B setting up to 280 (assuming frequency) and this helped walking some but he still has problems with balance and freezing.  He is on mirapex, 1 mg tid.  He is on carbidopa/levodopa 25/100, 7:30am/11am/2pm/6pm.    11/15/13:  Pt returns today for follow up.  Overall, he has been doing well.  He is off of medication this AM for programming.  His wife reports that he has done better on his new setting.  Seems to be walking better with less dyskinesia.  Until this morning, he was still on the carbidopa/levodopa 25/100, 1 tablet 4 times per day, Azilect 1 mg daily and Mirapex 1 mg 3 times per day.  He is exercising faithfully.  He has not had any falls.  He has been shuffling a little more this  morning, since he is off of medication.  11/21/13 update:  The patient is returning for followup, earlier than expected.  The patient reports that he really had not been doing as well over the weekend.  He did not feel like he was walking as well. He felt like the setting "wore" off 30 mins before the next levodopa dosage.  His wife wonders if he wasn't doing as well because he was at the beach this past weekend and there were more stairs to negotiate.  No falls.  No hallucinations   He did decrease his carbidopa/levodopa 25/100, one tablet 3 times per day from one tablet 4 times per day.  He is still on Mirapex 1 mg 3 times per day and Azilect 1 mg daily.  12/20/13 update:  The patient returns today for followup, accompanied by his wife and daughter who supplements the history.  The patient is currently on Mirapex, 1 mg 3 times a day, Azilect 1 mg daily and carbidopa/levodopa 25/100 at 7:30 AM/11 AM/2 PM and 6:30 PM.  Overall, the patient states that he is doing much better on his current setting.  It is not seeming to "wear off."  He is having some difficulty in the middle of the night when he gets  up and sometimes has to use a cane, but otherwise is doing well.  No falls.  On one occasion, he did try to get up in the morning and go and take a walk and was able to walk a few miles without taking his medication, but then he needed it when he got home.  He has noticed some dyskinesia, but it has not been particularly bothersome.  He would like to decrease dyskinesia, if able.  03/20/14 update:  Pt is f//u today.  Overall doing well.  He tried to decrease his carbidopa/levodopa 25/100 from one tablet 4 times per day to 1/0.5/0.5/1, but ended up going back up to one tablet 4 times per day.  He finds that he is due for his second dose at 11 AM, but by 10:30 AM he is shuffling, and he thinks it is primarily the right foot.  When he goes hiking, he states that he takes an extra half tablet at 10:30 AM and as well and  often takes another half tablet in the afternoon.  He finds that protein interferes with the levodopa dosing.  He continues to take 2 Flexeril at night.  He does not know if it helps, but has not tried to stop that since the DBS surgery.  He finds that the only time he has dyskinesia is at the end of the dose.  He asks me if we can slightly increase the DBS stimulation.  He continues to exercise.  No falls.  No hallucinations.  05/18/14 update: The patient is following up today, accompanied by his wife who supplements the history.  I tried to slightly decrease the levodopa, but the patient is was not able to do that.  He remains on carbidopa/levodopa 25/100, 1 tablet 4 times per day.  He is also on Mirapex 1 mg 3 times per day along with Azilect 1 mg daily. C/o some "slight" freezing episodes and med wearing off 1/2 hr before due but at the same time having frequent dyskinesia of the R leg.  No falls.  Staying very active.  No hallucinations.  C/o cost of azilect and asks about its benefit.    08/08/14 update:  Overall, the patient is doing well.  He has not taken medication since 6:30 this morning, and wanted me to see him off of medication.  He states that he has some "spotty freezing."  He asks about potentially taking metformin to help him.  He also asks about levodopa nasal spray.  He asks about several other experimental treatments.  He has occasional dyskinesia in the right foot but it is not bothersome to him.  Overall, he feels slightly slower than he used to.  He states that he generally is supposed to take his levodopa at 11 AM but he needs it by 10:30.  If he is very active he will need to take a few extra half dosages of levodopa throughout the day.  Generally, however, he takes 4 tablets of carbidopa/levodopa 25/100 throughout the day in addition to entacapone 200 mg twice a day and Mirapex 1 mg 3 times a day.  He is off of Azilect and wants to know if he should go back on it.  He noticed no  difference when discontinuing it.  He is off of Flexeril as well as noticed no trouble with that.  10/10/14 update:  Overall, the patient is doing well.  He is on carbidopa/levodopa 25/100 4 times per day.  He takes entacapone 200 mg with the  first and third dose of the levodopa.  He really does not notice that it helps and states that he still feels "jerky" 30 minutes before his next dosage is due.  He will often just take the levodopa and then.  He is on Mirapex 1 mg 3 times a day.  I added carbidopa/levodopa 50/200 CR at night because of cramping and the patient states that it helps but he rarely takes it because he thinks the dyskinesia that worse when he did it.  He states that he uses it if he really needs to "get up and go."  He does use Flexeril at night for cramping and thinks it helped as much as the carbidopa/levodopa 50/200 CR.  He did turn down the DBS device somewhat since last visit because of dyskinesia.  He states that he takes Azilect "off and on" because he was told by a previous neurologist that it was neuroprotective.  He asks me about isradapine.  02/12/15 update:  The patient returns today for follow-up.  I have communicated with him several times since last visit.  He called with complaints of freezing or before his next dose and we ended up slightly increasing his levodopa so that he is taking 1-1/2 tablets in the morning, one tablet at his next dose, 1-1/2 tablets in the mid afternoon and then 1 tablet in the later evening.  This helped but he states that sometimes he will take 1 in the AM and the 1/2 an hour or 2 later.  He still only takes 3-4 a day.  He takes entacapone with every other dose of the levodopa.  He is on pramipexole 1 mg 3 times per day.  Dyskinesia has been problematic despite the fact he complains about freezing and we ended up adding amantadine, 100 mg 3 times a day for that. It helped that but caused constipation and dry mouth.   He had 2 falls since last visit, one  of which was at the outer edge of my office when he came to bring me something.  He quickly turned around while carrying something and fell to his knee.  He did not get hurt.   He states that if he gets in a crowded space his feet will just get tangled together.   He does know that if he gets into "shuffle mode" he needs to stop and "reset."  He states overall shuffling is much better.  He asked me about several studies since our last visit, but these are not available to him because of the fact he has already had DBS.  He asks me again about nilotinib study.  05/15/15 update:  The patient is following up today regarding his Parkinson's disease.  States that he has been "fantastic."  He did have a postoperative MRI done since our last visit.  This is first he has ever had since his surgery.  The [facility] plane is approximately at contact number 2 on the lead.  We are currently actively using contact 1.  We have used contact 2 in the past successfully.  He is currently on carbidopa/levodopa 25/100; he has gone down a bit on this to about 4 and sometimes 4.5 tablets a day.  Is on Comtan 200 mg twice a day, Mirapex 1 mg 3 times a day and amantadine 100 mg 3 times a day for dyskinesia.  If he gets nervous, his legs will "twitch."  He is exercising on the bike in addition to doing physical labor.  Dropped his own programming to 3.2 and thinks that it has helped.  Did have 1-2 falls when "I get in a hurry."  Never got hurt.  Notices more trouble when he is at end of dosage.  PREVIOUS MEDICATIONS: Sinemet, Mirapex and azilect  ALLERGIES:   Allergies  Allergen Reactions  . Amoxicillin   . Celebrex [Celecoxib]   . Demerol Other (See Comments)    Hallucinations   . Penicillins     REACTION: RASH    CURRENT MEDICATIONS:  Current Outpatient Prescriptions on File Prior to Visit  Medication Sig Dispense Refill  . amantadine (SYMMETREL) 100 MG capsule TAKE ONE CAPSULE THREE TIMES A DAY 90 capsule 11  .  carbidopa-levodopa (SINEMET IR) 25-100 MG per tablet Take 1 tablet by mouth 5 (five) times daily. (Patient taking differently: Take 1 tablet by mouth 4 (four) times daily. ) 150 tablet 5  . clonazePAM (KLONOPIN) 0.5 MG tablet Take 2 tablets (1 mg total) by mouth at bedtime. (Patient taking differently: Take 0.5 mg by mouth at bedtime. ) 60 tablet 0  . cyclobenzaprine (FLEXERIL) 10 MG tablet TAKE TWO TABLETS AT BEDTIME 60 tablet 5  . entacapone (COMTAN) 200 MG tablet TAKE ONE TABLET TWICE A DAY 60 tablet 5  . pramipexole (MIRAPEX) 1 MG tablet TAKE 1 TABLET 3 TIMES DAILY 90 tablet 5   No current facility-administered medications on file prior to visit.    PAST MEDICAL HISTORY:   Past Medical History  Diagnosis Date  . GERD (gastroesophageal reflux disease) Pre 2002    Mild  . Ruptured appendix teens  . History of kidney stones   . 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 Parkinson disease ([place]) 2004    Slowly progressive  . Arthritis     left hip     PAST SURGICAL HISTORY:   Past Surgical History  Procedure Laterality Date  . Neuro evaluation  12/19/2004    [name], benign tremor  . Doppler echocardiography  01/10/2009    LV NML Mild LVH EF 60-65% aortic sclerosis w/0 stenosis   . Appendectomy    . Joint replacement      left hip replacement 08/14/11/[facility]   . Total hip revision  08/27/2011    Procedure: TOTAL HIP REVISION;  Surgeon: Mauri Pole, MD;  Location: [facility];  Service: Orthopedics;  Laterality: Left;  . Deep brain stimulator placement  8/14    L STN    SOCIAL HISTORY:   Social History   Social History  . Marital Status: Married    Spouse Name: N/A  . Number of Children: 2  . Years of Education: N/A   Occupational History  . Optometrist     medically retired   Social History Main Topics  . Smoking status: Never Smoker   . Smokeless tobacco: Never Used  . Alcohol Use: 3.5  oz/week    7 drink(s) per week     Comment: occassionally  . Drug Use: No  . Sexual Activity: Yes   Other Topics Concern  . Not on file   Social History Narrative   Married, lives with wife   2 daughters      Has living will   Wife has health care POA---then daughters   Would still accept CPR--but no prolonged artificial means (ventilator or tube feeds)    FAMILY HISTORY:   Family Status  Relation Status Death Age  .  Mother Deceased 54    knee replacement  . Father Deceased 55    tremor  . Sister Engineer, mining in [place], [place]    ROS:  A complete 10 system review of systems was obtained and was unremarkable apart from what is mentioned above.  PHYSICAL EXAMINATION:    VITALS:   Filed Vitals:   05/15/15 0946  BP: 138/70  Pulse: 80  Height: 6\' 1"  (1.854 m)  Weight: 175 lb (79.379 kg)    GEN:  The patient appears stated age and is in NAD. HEENT:  Normocephalic, atraumatic.  The mucous membranes are moist. The superficial temporal arteries are without ropiness or tenderness. CV: RRR Lungs:  CTAB Neck: no bruits   Neurological examination:  Orientation: The patient is alert and oriented x3.  Cranial nerves: There is good facial symmetry.  There is minimal facial hypomimia.  Pupils are equal round and reactive to light bilaterally.  The speech is slightly dysarthric but fluent. Soft palate rises symmetrically and there is no tongue deviation. Hearing is intact to conversational tone. Motor: Strength is 5/5 in the bilateral upper and lower extremities.   Shoulder shrug is equal and symmetric.  There is no pronator drift.  Movement examination: Tone: There is normal tone in the bilateral upper extremities.  The tone in the lower extremities is normal.  Abnormal movements: There is dyskinesia today in the RLE but less so than previous Coordination:  There is good RAMs today Gait and Station: The patient has no difficulty arising out of a deep-seated chair and  he "runs" down the hall and is short stepped.  He does have start hesitation.  Dyskinesia of the R arm with walking.  Is unstable.  DBS programming was performed today which is described in more detail on a separate programming procedural notes.    ASSESSMENT/PLAN:  1. Parkinson's disease.    -The patient is status post left ST[facility] DBS at [facility] on 03/14/2013.  -remain on the carbidopa/levodopa 25/100, which he has gone down on to 4-4.5 tablets/day and comtan 200 mg bid  -For now, he will remain on the Mirapex, 1 mg 3 times per day and amantadine 100 mg tid, which is helping dyskinesia  -I did talk to patient re: diagnosis.  I would have loved to seen him early on in the disease.  By definition, patients with PD have to have sx's on the "good" side of the body within 3 years of diagnosis, and he has never had sx's on the opposite side.  This has really called into question, for me, the diagnosis but I cannot deny that he has done much better post DBS  -Talked to him again about supplements.  He asks me about the alpha lipoic acid he is taking.  Told him not FDA approved, not convinced it is helping  -Reviewed with him in detail his MRI of the brain and showed him the images.  -DBS programming was performed today.  He actually came in on quite different settings than he left on, as he has changed them since last visit.  He also asked me today to show him again how to change settings, but I told him I would prefer that he just did not change them.  I did show him, however, how to use the patient programmer. 2.  Constipation  -Was made worse by amantadine.  Talked about the regular use of the rancho recipe.  Talked about the use of Colace.  He  can also use MiraLAX as needed, although not necessarily regularly. 3.  Much greater than 50% of the 40 minute visit (doesn't include DBS time) was in counseling and coordinating care, as above.

## 2015-05-15 NOTE — Procedures (Signed)
DBS Programming was performed.    Total time spent reviewing programming was 25 minutes.  Device was confirmed to be on.  Soft start was confirmed to be on.  Impedences were checked and were within normal limits.   All impedances were well within normal limits and are recorded on a separate worksheet.  Battery was checked and was determined to be functioning normally and not near the end of life.  Final settings were as follows:  Left brain electrode, Group A:     2-C+; Amplitude  3.5   V   ; Pulse width 60 microseconds;   Frequency   140   Hz.  Left brain electrode, group B:  1-C+, amplitude: 3.2 V, PW: 90, frequency 140  Group B was active at the time pt left the office.  Right brain electrode:     n/a

## 2015-05-20 ENCOUNTER — Telehealth: Payer: Self-pay

## 2015-05-20 NOTE — Telephone Encounter (Signed)
Pt returning call; do not see note and pt not sure who called; pt did get occult blood result on my chart.

## 2015-06-03 ENCOUNTER — Ambulatory Visit: Payer: Medicare Other | Admitting: Podiatry

## 2015-07-02 ENCOUNTER — Other Ambulatory Visit: Payer: Self-pay | Admitting: Internal Medicine

## 2015-07-02 NOTE — Telephone Encounter (Signed)
04/30/2015 

## 2015-07-03 NOTE — Telephone Encounter (Signed)
rx called into pharmacy

## 2015-07-03 NOTE — Telephone Encounter (Signed)
Approved: #60 x 0 Change to 1-2 tabs at bedtime prn

## 2015-07-12 ENCOUNTER — Telehealth: Payer: Self-pay | Admitting: Neurology

## 2015-07-12 NOTE — Telephone Encounter (Signed)
PT called in regards to his medication  Cannabis and would like a call back/Dawn CB# [phone] or [phone]

## 2015-07-12 NOTE — Telephone Encounter (Signed)
Spoke with patient and him aware cannabis has been proven to worsen PD symptoms - particularly cognition and falls. He will call back with any other problems.

## 2015-07-21 HISTORY — PX: CATARACT EXTRACTION W/ INTRAOCULAR LENS  IMPLANT, BILATERAL: SHX1307

## 2015-08-05 ENCOUNTER — Telehealth: Payer: Self-pay | Admitting: Neurology

## 2015-08-05 NOTE — Telephone Encounter (Signed)
Spoke with patient - he again asked about cannabis oil in treatment of PD. Made him aware Dr Tat has stated research shows this makes falls worse and cognition worse. He states from what he has read this study was only in people under 74 years old. He will discuss at his upcoming appt.

## 2015-08-05 NOTE — Telephone Encounter (Signed)
VM-PT left message to have a call back/Dawn CB# [phone]

## 2015-08-14 ENCOUNTER — Other Ambulatory Visit: Payer: Self-pay | Admitting: Neurology

## 2015-08-14 NOTE — Telephone Encounter (Signed)
Mirapex refill requested. Per last office note- patient to remain on medication. Refill approved and sent to patient's pharmacy.   

## 2015-08-15 ENCOUNTER — Telehealth: Payer: Self-pay | Admitting: Neurology

## 2015-08-15 ENCOUNTER — Ambulatory Visit (INDEPENDENT_AMBULATORY_CARE_PROVIDER_SITE_OTHER): Payer: Medicare Other | Admitting: Neurology

## 2015-08-15 ENCOUNTER — Other Ambulatory Visit: Payer: Self-pay | Admitting: Neurology

## 2015-08-15 ENCOUNTER — Encounter: Payer: Self-pay | Admitting: Neurology

## 2015-08-15 VITALS — BP 118/64 | HR 79 | Ht 72.0 in | Wt 173.0 lb

## 2015-08-15 DIAGNOSIS — G249 Dystonia, unspecified: Secondary | ICD-10-CM

## 2015-08-15 DIAGNOSIS — Z9689 Presence of other specified functional implants: Secondary | ICD-10-CM

## 2015-08-15 DIAGNOSIS — G20 Parkinson's disease: Secondary | ICD-10-CM | POA: Diagnosis not present

## 2015-08-15 NOTE — Telephone Encounter (Signed)
Mychart message sent to patient informing him of the information from Dr Tat.

## 2015-08-15 NOTE — Telephone Encounter (Signed)
Rx sent 

## 2015-08-15 NOTE — Progress Notes (Signed)
1`   Casey Tucker was seen today in the movement disorders clinic for neurologic consultation at the request of [name], MD.  The consultation is for the evaluation of PD.  The records that were made available to me were reviewed.  Pt is accompanied by his wife who supplements the history.   Pt reports first sx was in 2006.  Pt is R hand dominant.  Pt states that his next door neighbor (Network engineer) noted that he wasn't swinging his arm and thought that he had a stroke. He subsequently saw [name] in about 2006 and he was dx with PD.  He was started on mirapex and azilect.  It helped the overall stiffness.  Tremor developed in the R arm around the same time as the diagnosis.  Levodopa was started in approximately 2009-2010.  He also started an exercise program and he believes that has made a huge difference.  He began to have some motor fluctuations and saw [name] and had his surgery done in 02/2013.  It was a unilateral implant, placed into the L STN.  He has noted that he is sleeping much better since surgery.  No tremor.  [name] programmed the DBS one week post op.  He f/u with the NP at [facility], and he now has an A and a B setting.  He hates the A setting (doesn't walk well) and he "cranked" his own B setting up to 280 (assuming frequency) and this helped walking some but he still has problems with balance and freezing.  He is on mirapex, 1 mg tid.  He is on carbidopa/levodopa 25/100, 7:30am/11am/2pm/6pm.    11/15/13:  Pt returns today for follow up.  Overall, he has been doing well.  He is off of medication this AM for programming.  His wife reports that he has done better on his new setting.  Seems to be walking better with less dyskinesia.  Until this morning, he was still on the carbidopa/levodopa 25/100, 1 tablet 4 times per day, Azilect 1 mg daily and Mirapex 1 mg 3 times per day.  He is exercising faithfully.  He has not had any falls.  He has been shuffling a little more this  morning, since he is off of medication.  11/21/13 update:  The patient is returning for followup, earlier than expected.  The patient reports that he really had not been doing as well over the weekend.  He did not feel like he was walking as well. He felt like the setting "wore" off 30 mins before the next levodopa dosage.  His wife wonders if he wasn't doing as well because he was at the beach this past weekend and there were more stairs to negotiate.  No falls.  No hallucinations   He did decrease his carbidopa/levodopa 25/100, one tablet 3 times per day from one tablet 4 times per day.  He is still on Mirapex 1 mg 3 times per day and Azilect 1 mg daily.  12/20/13 update:  The patient returns today for followup, accompanied by his wife and daughter who supplements the history.  The patient is currently on Mirapex, 1 mg 3 times a day, Azilect 1 mg daily and carbidopa/levodopa 25/100 at 7:30 AM/11 AM/2 PM and 6:30 PM.  Overall, the patient states that he is doing much better on his current setting.  It is not seeming to "wear off."  He is having some difficulty in the middle of the night when he gets  up and sometimes has to use a cane, but otherwise is doing well.  No falls.  On one occasion, he did try to get up in the morning and go and take a walk and was able to walk a few miles without taking his medication, but then he needed it when he got home.  He has noticed some dyskinesia, but it has not been particularly bothersome.  He would like to decrease dyskinesia, if able.  03/20/14 update:  Pt is f//u today.  Overall doing well.  He tried to decrease his carbidopa/levodopa 25/100 from one tablet 4 times per day to 1/0.5/0.5/1, but ended up going back up to one tablet 4 times per day.  He finds that he is due for his second dose at 11 AM, but by 10:30 AM he is shuffling, and he thinks it is primarily the right foot.  When he goes hiking, he states that he takes an extra half tablet at 10:30 AM and as well and  often takes another half tablet in the afternoon.  He finds that protein interferes with the levodopa dosing.  He continues to take 2 Flexeril at night.  He does not know if it helps, but has not tried to stop that since the DBS surgery.  He finds that the only time he has dyskinesia is at the end of the dose.  He asks me if we can slightly increase the DBS stimulation.  He continues to exercise.  No falls.  No hallucinations.  05/18/14 update: The patient is following up today, accompanied by his wife who supplements the history.  I tried to slightly decrease the levodopa, but the patient is was not able to do that.  He remains on carbidopa/levodopa 25/100, 1 tablet 4 times per day.  He is also on Mirapex 1 mg 3 times per day along with Azilect 1 mg daily. C/o some "slight" freezing episodes and med wearing off 1/2 hr before due but at the same time having frequent dyskinesia of the R leg.  No falls.  Staying very active.  No hallucinations.  C/o cost of azilect and asks about its benefit.    08/08/14 update:  Overall, the patient is doing well.  He has not taken medication since 6:30 this morning, and wanted me to see him off of medication.  He states that he has some "spotty freezing."  He asks about potentially taking metformin to help him.  He also asks about levodopa nasal spray.  He asks about several other experimental treatments.  He has occasional dyskinesia in the right foot but it is not bothersome to him.  Overall, he feels slightly slower than he used to.  He states that he generally is supposed to take his levodopa at 11 AM but he needs it by 10:30.  If he is very active he will need to take a few extra half dosages of levodopa throughout the day.  Generally, however, he takes 4 tablets of carbidopa/levodopa 25/100 throughout the day in addition to entacapone 200 mg twice a day and Mirapex 1 mg 3 times a day.  He is off of Azilect and wants to know if he should go back on it.  He noticed no  difference when discontinuing it.  He is off of Flexeril as well as noticed no trouble with that.  10/10/14 update:  Overall, the patient is doing well.  He is on carbidopa/levodopa 25/100 4 times per day.  He takes entacapone 200 mg with the  first and third dose of the levodopa.  He really does not notice that it helps and states that he still feels "jerky" 30 minutes before his next dosage is due.  He will often just take the levodopa and then.  He is on Mirapex 1 mg 3 times a day.  I added carbidopa/levodopa 50/200 CR at night because of cramping and the patient states that it helps but he rarely takes it because he thinks the dyskinesia that worse when he did it.  He states that he uses it if he really needs to "get up and go."  He does use Flexeril at night for cramping and thinks it helped as much as the carbidopa/levodopa 50/200 CR.  He did turn down the DBS device somewhat since last visit because of dyskinesia.  He states that he takes Azilect "off and on" because he was told by a previous neurologist that it was neuroprotective.  He asks me about isradapine.  02/12/15 update:  The patient returns today for follow-up.  I have communicated with him several times since last visit.  He called with complaints of freezing or before his next dose and we ended up slightly increasing his levodopa so that he is taking 1-1/2 tablets in the morning, one tablet at his next dose, 1-1/2 tablets in the mid afternoon and then 1 tablet in the later evening.  This helped but he states that sometimes he will take 1 in the AM and the 1/2 an hour or 2 later.  He still only takes 3-4 a day.  He takes entacapone with every other dose of the levodopa.  He is on pramipexole 1 mg 3 times per day.  Dyskinesia has been problematic despite the fact he complains about freezing and we ended up adding amantadine, 100 mg 3 times a day for that. It helped that but caused constipation and dry mouth.   He had 2 falls since last visit, one  of which was at the outer edge of my office when he came to bring me something.  He quickly turned around while carrying something and fell to his knee.  He did not get hurt.   He states that if he gets in a crowded space his feet will just get tangled together.   He does know that if he gets into "shuffle mode" he needs to stop and "reset."  He states overall shuffling is much better.  He asked me about several studies since our last visit, but these are not available to him because of the fact he has already had DBS.  He asks me again about nilotinib study.  05/15/15 update:  The patient is following up today regarding his Parkinson's disease.  States that he has been "fantastic."  He did have a postoperative MRI done since our last visit.  This is first he has ever had since his surgery.  The [facility] plane is approximately at contact number 2 on the lead.  We are currently actively using contact 1.  We have used contact 2 in the past successfully.  He is currently on carbidopa/levodopa 25/100; he has gone down a bit on this to about 4 and sometimes 4.5 tablets a day.  Is on Comtan 200 mg twice a day, Mirapex 1 mg 3 times a day and amantadine 100 mg 3 times a day for dyskinesia.  If he gets nervous, his legs will "twitch."  He is exercising on the bike in addition to doing physical labor.  Dropped his own programming to 3.2 and thinks that it has helped.  Did have 1-2 falls when "I get in a hurry."  Never got hurt.  Notices more trouble when he is at end of dosage.  08/15/15 update:  The patient is following up today regarding his Parkinson's disease.  He is on carbidopa/levodopa 25/100, 4- tablets per day (and then will take 2 extra 1/2 tablets as needed) along with entacapone, 200 mg twice a day.  He also takes pramipexole, 1 mg 3 times per day and amantadine 100 mg 3 times a day for dyskinesia.   Amantadine is causing dry mouth and he is using biotene to help that.   He has called several times since last  visit asking me about medical marijuana and its place in the treatment of Parkinson's disease.  I have talked to him about the position statement by the [place] [employer].  He has not fallen since our last visit.  He reports increased dyskinesia only when he has increased stress.  Reports that he changed his DBS setting again back to where I had it previously (3.4).  He uses 2 flexeril at night for cramping.  He brings me articles to read on natural "supplements" and also asks me to look up a center in [place] that is owned by a "chiropractic neurologist" and has week long "camps" for patient to have Parkinson's disease that are supposed to help with healing of Parkinson's disease and not paid by insurance.  PREVIOUS MEDICATIONS: Sinemet, Mirapex and azilect  ALLERGIES:   Allergies  Allergen Reactions  . Amoxicillin   . Celebrex [Celecoxib]   . Demerol Other (See Comments)    Hallucinations   . Penicillins     REACTION: RASH    CURRENT MEDICATIONS:  Current Outpatient Prescriptions on File Prior to Visit  Medication Sig Dispense Refill  . amantadine (SYMMETREL) 100 MG capsule TAKE ONE CAPSULE THREE TIMES A DAY 90 capsule 11  . carbidopa-levodopa (SINEMET IR) 25-100 MG per tablet Take 1 tablet by mouth 5 (five) times daily. (Patient taking differently: Take 1 tablet by mouth 4 (four) times daily. ) 150 tablet 5  . clonazePAM (KLONOPIN) 0.5 MG tablet Take 1-2 tablets (0.5-1 mg total) by mouth at bedtime as needed. 60 tablet 0  . cyclobenzaprine (FLEXERIL) 10 MG tablet TAKE TWO TABLETS AT BEDTIME 60 tablet 5  . entacapone (COMTAN) 200 MG tablet TAKE ONE TABLET TWICE A DAY 60 tablet 5  . pramipexole (MIRAPEX) 1 MG tablet TAKE 1 TABLET 3 TIMES DAILY 90 tablet 5   No current facility-administered medications on file prior to visit.    PAST MEDICAL HISTORY:   Past Medical History  Diagnosis Date  . GERD (gastroesophageal reflux disease) Pre 2002    Mild  . Ruptured appendix  teens  . History of kidney stones   . 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 Parkinson disease ([place]) 2004    Slowly progressive  . Arthritis     left hip     PAST SURGICAL HISTORY:   Past Surgical History  Procedure Laterality Date  . Neuro evaluation  12/19/2004    [name], benign tremor  . Doppler echocardiography  01/10/2009    LV NML Mild LVH EF 60-65% aortic sclerosis w/0 stenosis   . Appendectomy    . Joint replacement      left hip replacement 08/14/11/[place]   .  Total hip revision  08/27/2011    Procedure: TOTAL HIP REVISION;  Surgeon: Mauri Pole, MD;  Location: [facility];  Service: Orthopedics;  Laterality: Left;  . Deep brain stimulator placement  8/14    L STN    SOCIAL HISTORY:   Social History   Social History  . Marital Status: Married    Spouse Name: N/A  . Number of Children: 2  . Years of Education: N/A   Occupational History  . Optometrist     medically retired   Social History Main Topics  . Smoking status: Never Smoker   . Smokeless tobacco: Never Used  . Alcohol Use: 3.5 oz/week    7 drink(s) per week     Comment: occassionally  . Drug Use: No  . Sexual Activity: Yes   Other Topics Concern  . Not on file   Social History Narrative   Married, lives with wife   2 daughters      Has living will   Wife has health care POA---then daughters   Would still accept CPR--but no prolonged artificial means (ventilator or tube feeds)    FAMILY HISTORY:   Family Status  Relation Status Death Age  . Mother Deceased 55    knee replacement  . Father Deceased 65    tremor  . Sister Engineer, mining in [place], [place]    ROS:  A complete 10 system review of systems was obtained and was unremarkable apart from what is mentioned above.  PHYSICAL EXAMINATION:    VITALS:   Filed Vitals:   08/15/15 1054  BP: 118/64  Pulse: 79  Height: 6' (1.829 m)  Weight:  173 lb (78.472 kg)    GEN:  The patient appears stated age and is in NAD. HEENT:  Normocephalic, atraumatic.  The mucous membranes are moist. The superficial temporal arteries are without ropiness or tenderness. CV: RRR Lungs:  CTAB Neck: no bruits   Neurological examination:  Orientation: The patient is alert and oriented x3.  Cranial nerves: There is good facial symmetry.  There is minimal facial hypomimia.  Pupils are equal round and reactive to light bilaterally.  The speech is slightly dysarthric but fluent. Soft palate rises symmetrically and there is no tongue deviation. Hearing is intact to conversational tone. Motor: Strength is 5/5 in the bilateral upper and lower extremities.   Shoulder shrug is equal and symmetric.  There is no pronator drift.  Movement examination: Tone: There is normal tone in the bilateral upper extremities.  The tone in the lower extremities is normal.  Abnormal movements: There is dyskinesia today in the RLE but less so than previous Coordination:  There is good RAMs today Gait and Station: The patient has no difficulty arising out of a deep-seated chair and he "runs" down the hall and is short stepped.  He does have start hesitation.  Dyskinesia of the R arm with walking.  Is unstable but less so than last visit, primarily because he was more careful.  DBS programming was performed today which is described in more detail on a separate programming procedural notes.    ASSESSMENT/PLAN:  1. Parkinson's disease.    -The patient is status post left STN DBS at [facility] on 03/14/2013.  -remain on the carbidopa/levodopa 25/100, which he has gone down on to 4-5 tablets/day and comtan 200 mg bid  -For now, he will remain on the Mirapex, 1 mg 3 times per day and amantadine 100 mg tid, which  is helping dyskinesia.  He has some dry mouth with the amantadine, but uses Biotene.  -I did talk to patient re: diagnosis.  I would have loved to seen him early on in the  disease.  By definition, patients with PD have to have sx's on the "good" side of the body within 3 years of diagnosis, and he has never had sx's on the opposite side.  This has really called into question, for me, the diagnosis but I cannot deny that he has done much better post DBS  -Talked to him again about supplements.  He left articles for me to read, which I did and ended up calling him and explaining that the supplements are not appropriate.  The studies were done on worms.  This type of study cannot be translated to human beings.  In addition, he asked me about a camp in [place] that was not paid by insurance.  It was run by a Restaurant manager, fast food.  In my opinion, this was giving false claims and not helpful for him.    -DBS programming was performed today.   2.  Constipation  -Was made worse by amantadine.  He is using the rancho recipe.  He is using Biotene for the dry mouth. 3.  Much greater than 50% of the 40 minute visit (doesn't include DBS time) was in counseling and coordinating care, as above.

## 2015-08-15 NOTE — Telephone Encounter (Deleted)
Casey Tucker, please call pt.  He left me an article to read and wanted me to comment and give thoughts/recommendations.  Tell him that the study was done on worms and no conclusions can be inferred from worms to humans and I certainly don't recommend a supplement that claims to have beneficial activity for worms who have PD.  Also, regarding the [place] [facility] information he left me.  This is a Physiological scientist.  This is not a medical neurologist.  I do not recommend it.  I believe that unfortunate false claims are being made at these types of centers who claim healing for PD at their week long "camps."  Please tell him that he is doing amazing with standard medical therapy and to keep exercising.

## 2015-08-15 NOTE — Telephone Encounter (Signed)
Jade, please call pt.  He left me an article to read and wanted me to comment and give thoughts/recommendations.  Tell him that the study was done on worms and no conclusions can be inferred from worms to humans and I certainly don't recommend a supplement that claims to have beneficial activity for worms who have PD.  Also, regarding the [place] [facility] information he left me.  This is a Physiological scientist.  This is not a medical neurologist.  I do not recommend it.  I believe that unfortunate false claims are being made at these types of centers who claim healing for PD at their week long "camps."  Please tell him that he is doing amazing with standard medical therapy and to keep exercising.

## 2015-08-15 NOTE — Procedures (Signed)
DBS Programming was performed.    Total time spent reviewing programming was 20 minutes.  Device was confirmed to be on.  Soft start was confirmed to be on.  Impedences were checked and were within normal limits.   All impedances were well within normal limits and are recorded on a separate worksheet.  Battery was checked and was determined to be functioning normally and not near the end of life.  Final settings were as follows:  Left brain electrode, Group A:     2-C+; Amplitude  3.5   V   ; Pulse width 60 microseconds;   Frequency   140   Hz.  Left brain electrode, group B:  1-C+, amplitude: 3.4 V, PW: 90, frequency 130  Group B was active at the time pt left the office.  Right brain electrode:     n/a

## 2015-08-20 DIAGNOSIS — M19071 Primary osteoarthritis, right ankle and foot: Secondary | ICD-10-CM | POA: Diagnosis not present

## 2015-08-20 DIAGNOSIS — M19072 Primary osteoarthritis, left ankle and foot: Secondary | ICD-10-CM | POA: Diagnosis not present

## 2015-08-24 ENCOUNTER — Other Ambulatory Visit: Payer: Self-pay | Admitting: Internal Medicine

## 2015-08-26 NOTE — Telephone Encounter (Signed)
Approved: #60 x 0 

## 2015-08-26 NOTE — Telephone Encounter (Signed)
rx called into pharmacy

## 2015-08-26 NOTE — Telephone Encounter (Signed)
07/03/2015 

## 2015-09-11 ENCOUNTER — Telehealth: Payer: Self-pay | Admitting: *Deleted

## 2015-09-11 NOTE — Telephone Encounter (Signed)
-----   Message from [place], DO sent at 09/09/2015 10:52 AM EST ----- He is so darn sweet!!  Please tell him that while I would love it I really don't want him to spend his time, effort and money on me and to reserve that for someone else special :) ----- Message -----    From: Vassie Moselle    Sent: 09/09/2015  10:45 AM      To: Eustace Quail Tat, DO  Pt called and wants to know if you want a table made out of walnut it is 10 ft long by 40 inches please call [phone]

## 2015-10-02 DIAGNOSIS — L821 Other seborrheic keratosis: Secondary | ICD-10-CM | POA: Diagnosis not present

## 2015-10-02 DIAGNOSIS — Z08 Encounter for follow-up examination after completed treatment for malignant neoplasm: Secondary | ICD-10-CM | POA: Diagnosis not present

## 2015-10-02 DIAGNOSIS — Z85828 Personal history of other malignant neoplasm of skin: Secondary | ICD-10-CM | POA: Diagnosis not present

## 2015-10-02 DIAGNOSIS — L570 Actinic keratosis: Secondary | ICD-10-CM | POA: Diagnosis not present

## 2015-10-02 DIAGNOSIS — Z872 Personal history of diseases of the skin and subcutaneous tissue: Secondary | ICD-10-CM | POA: Diagnosis not present

## 2015-10-02 DIAGNOSIS — X32XXXA Exposure to sunlight, initial encounter: Secondary | ICD-10-CM | POA: Diagnosis not present

## 2015-10-11 ENCOUNTER — Other Ambulatory Visit: Payer: Self-pay | Admitting: Internal Medicine

## 2015-10-11 NOTE — Telephone Encounter (Signed)
Rx called in to [place] at pharmacy

## 2015-10-11 NOTE — Telephone Encounter (Signed)
Last refill 08-26-15 @60 /0 Last OV 04-30-15 Next OV 05-05-16

## 2015-10-11 NOTE — Telephone Encounter (Signed)
Approved: #60 x 0 

## 2015-10-18 ENCOUNTER — Other Ambulatory Visit: Payer: Self-pay | Admitting: Specialist

## 2015-10-26 ENCOUNTER — Other Ambulatory Visit: Payer: Self-pay | Admitting: Neurology

## 2015-10-28 NOTE — Telephone Encounter (Signed)
See how he is actually taking this.   2 at bed would be a lot

## 2015-10-28 NOTE — Telephone Encounter (Signed)
Please advise if okay to fill  

## 2015-10-28 NOTE — Telephone Encounter (Signed)
He has been taking two tablets at bedtime. This works well for him for sleep. Original RX written by Dr Erling Cruz. Please advise.

## 2015-10-28 NOTE — Telephone Encounter (Signed)
Left message on machine for patient to call back.

## 2015-10-29 ENCOUNTER — Other Ambulatory Visit: Payer: Self-pay | Admitting: Neurology

## 2015-10-29 MED ORDER — CYCLOBENZAPRINE HCL 10 MG PO TABS: 20.0000 mg | ORAL_TABLET | Freq: Every day | ORAL | Status: DC

## 2015-10-29 NOTE — Telephone Encounter (Signed)
Flexeril RX sent to pharmacy - ok per Dr Tat.

## 2015-10-30 ENCOUNTER — Ambulatory Visit: Admission: RE | Admit: 2015-10-30 | Payer: Medicare Other | Source: Ambulatory Visit | Admitting: Specialist

## 2015-10-30 ENCOUNTER — Encounter: Admission: RE | Payer: Self-pay | Source: Ambulatory Visit

## 2015-10-30 SURGERY — CHEILECTOMY
Anesthesia: Choice | Laterality: Left

## 2015-11-11 ENCOUNTER — Other Ambulatory Visit: Payer: Self-pay | Admitting: Neurology

## 2015-11-11 NOTE — Telephone Encounter (Signed)
Comtan refill requested. Per last office note- patient to remain on medication. Refill approved and sent to patient's pharmacy.   

## 2015-11-18 DIAGNOSIS — H353131 Nonexudative age-related macular degeneration, bilateral, early dry stage: Secondary | ICD-10-CM | POA: Diagnosis not present

## 2015-11-18 DIAGNOSIS — H2513 Age-related nuclear cataract, bilateral: Secondary | ICD-10-CM | POA: Diagnosis not present

## 2015-12-02 ENCOUNTER — Other Ambulatory Visit: Payer: Self-pay | Admitting: Internal Medicine

## 2015-12-02 NOTE — Telephone Encounter (Signed)
Gave verbal refill to [place] at pharmacy

## 2015-12-02 NOTE — Telephone Encounter (Signed)
Last filled 10-11-15 #60 Last OV 04-30-15 Next OV 05-05-16

## 2015-12-02 NOTE — Telephone Encounter (Signed)
Approved: #60 x 0 

## 2015-12-04 ENCOUNTER — Encounter: Payer: Self-pay | Admitting: Internal Medicine

## 2015-12-04 ENCOUNTER — Ambulatory Visit (INDEPENDENT_AMBULATORY_CARE_PROVIDER_SITE_OTHER): Payer: Medicare Other | Admitting: Internal Medicine

## 2015-12-04 VITALS — BP 112/60 | HR 73 | Temp 97.4°F | Wt 171.0 lb

## 2015-12-04 DIAGNOSIS — S80922A Unspecified superficial injury of left lower leg, initial encounter: Secondary | ICD-10-CM | POA: Insufficient documentation

## 2015-12-04 NOTE — Assessment & Plan Note (Addendum)
Healing well No indication of infection but took dose of keflex yesterday--- will have him start keflex if it worsens while on fishing trip

## 2015-12-04 NOTE — Progress Notes (Signed)
Subjective:    Patient ID: Casey Tucker, male    DOB: 1940-09-05, 75 y.o.   MRN: [id number]  HPI Here for me to check injury on upper left calf  [place] 4 days ago at his house Hit directly on concrete--opened vertical opening on leg Bled quite a bit Stopped bleeding with compression--then cleaned with peroxide Had neighbor Dr Crist Infante check yesterday-- thought it was okay  No pain No fever No heat Going fishing and he wanted it checked  Took 1500mg  of keflex once yesterday (had from dentist)  Current Outpatient Prescriptions on File Prior to Visit  Medication Sig Dispense Refill  . amantadine (SYMMETREL) 100 MG capsule TAKE ONE CAPSULE THREE TIMES A DAY 90 capsule 11  . carbidopa-levodopa (SINEMET IR) 25-100 MG tablet TAKE ONE TABLET FIVE TIMES A DAY 150 tablet 3  . clonazePAM (KLONOPIN) 0.5 MG tablet TAKE 1 TO 2 TABLETS AT BEDTIME AS NEEDED 60 tablet 0  . cyclobenzaprine (FLEXERIL) 10 MG tablet Take 2 tablets (20 mg total) by mouth at bedtime. 60 tablet 5  . entacapone (COMTAN) 200 MG tablet TAKE ONE TABLET TWICE A DAY 60 tablet 5  . pramipexole (MIRAPEX) 1 MG tablet TAKE 1 TABLET 3 TIMES DAILY 90 tablet 5   No current facility-administered medications on file prior to visit.    Allergies  Allergen Reactions  . Amoxicillin   . Celebrex [Celecoxib]   . Demerol Other (See Comments)    Hallucinations   . Penicillins     REACTION: RASH    Past Medical History  Diagnosis Date  . GERD (gastroesophageal reflux disease) Pre 2002    Mild  . Ruptured appendix teens  . History of kidney stones   . 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 Parkinson disease ([place]) 2004    Slowly progressive  . Arthritis     left hip     Past Surgical History  Procedure Laterality Date  . Neuro evaluation  12/19/2004    [name], benign tremor  . Doppler echocardiography  01/10/2009    LV NML Mild LVH  EF 60-65% aortic sclerosis w/0 stenosis   . Appendectomy    . Joint replacement      left hip replacement 08/14/11/[facility]   . Total hip revision  08/27/2011    Procedure: TOTAL HIP REVISION;  Surgeon: Mauri Pole, MD;  Location: [facility];  Service: Orthopedics;  Laterality: Left;  . Deep brain stimulator placement  8/14    L STN    Family History  Problem Relation Age of Onset  . Arthritis Mother     knee replacement  . COPD Father     emphysema, smoker  . Heart disease Father     CHF  . Alcohol abuse Paternal Uncle     Social History   Social History  . Marital Status: Married    Spouse Name: N/A  . Number of Children: 2  . Years of Education: N/A   Occupational History  . Optometrist     medically retired   Social History Main Topics  . Smoking status: Never Smoker   . Smokeless tobacco: Never Used  . Alcohol Use: 3.5 oz/week    7 drink(s) per week     Comment: occassionally  . Drug Use: No  . Sexual Activity: Yes   Other Topics Concern  . Not on file   Social History Narrative  Married, lives with wife   2 daughters      Has living will   Wife has health care POA---then daughters   Would still accept CPR--but no prolonged artificial means (ventilator or tube feeds)   Review of Systems  Feels well No GI symptoms Appetite is okay     Objective:   Physical Exam  Skin:  approx 7-8 x 45mm vertical wound on left upper calf Healing eschar Minimal erythema No warmth or tenderness          Assessment & Plan:

## 2015-12-04 NOTE — Progress Notes (Signed)
Pre visit review using our clinic review tool, if applicable. No additional management support is needed unless otherwise documented below in the visit note. 

## 2015-12-10 ENCOUNTER — Other Ambulatory Visit: Payer: Self-pay | Admitting: Neurology

## 2015-12-10 NOTE — Telephone Encounter (Signed)
Carbidopa Levodopa refill requested. Per last office note- patient to remain on medication. Refill approved and sent to patient's pharmacy.   

## 2015-12-13 ENCOUNTER — Ambulatory Visit (INDEPENDENT_AMBULATORY_CARE_PROVIDER_SITE_OTHER): Payer: Medicare Other | Admitting: Neurology

## 2015-12-13 ENCOUNTER — Encounter: Payer: Self-pay | Admitting: Neurology

## 2015-12-13 ENCOUNTER — Ambulatory Visit: Payer: Medicare Other | Admitting: Neurology

## 2015-12-13 VITALS — BP 154/64 | HR 68 | Ht 72.0 in | Wt 170.0 lb

## 2015-12-13 DIAGNOSIS — G249 Dystonia, unspecified: Secondary | ICD-10-CM | POA: Diagnosis not present

## 2015-12-13 DIAGNOSIS — G20 Parkinson's disease: Secondary | ICD-10-CM

## 2015-12-13 DIAGNOSIS — Z9689 Presence of other specified functional implants: Secondary | ICD-10-CM

## 2015-12-13 NOTE — Procedures (Signed)
DBS Programming was performed.    Total time spent reviewing programming was 20 minutes.  Device was confirmed to be on.  Soft start was confirmed to be on.  Impedences were checked and were within normal limits.   All impedances were well within normal limits and are recorded on a separate worksheet.  Battery was checked and was determined to be functioning normally and not near the end of life.  Final settings were as follows:  Left brain electrode, Group A:     2-C+; Amplitude  3.5   V   ; Pulse width 60 microseconds;   Frequency   140   Hz.  Left brain electrode, group B:  1-C+, amplitude: 3.3 V, PW: 90, frequency 130  Group B was active at the time pt left the office.  Right brain electrode:     n/a

## 2015-12-13 NOTE — Progress Notes (Signed)
1`   Casey Tucker was seen today in the movement disorders clinic for neurologic consultation at the request of [name], MD.  The consultation is for the evaluation of PD.  The records that were made available to me were reviewed.  Pt is accompanied by his wife who supplements the history.   Pt reports first sx was in 2006.  Pt is R hand dominant.  Pt states that his next door neighbor (Network engineer) noted that he wasn't swinging his arm and thought that he had a stroke. He subsequently saw [name] in about 2006 and he was dx with PD.  He was started on mirapex and azilect.  It helped the overall stiffness.  Tremor developed in the R arm around the same time as the diagnosis.  Levodopa was started in approximately 2009-2010.  He also started an exercise program and he believes that has made a huge difference.  He began to have some motor fluctuations and saw [name] and had his surgery done in 02/2013.  It was a unilateral implant, placed into the L STN.  He has noted that he is sleeping much better since surgery.  No tremor.  [name] programmed the DBS one week post op.  He f/u with the NP at [facility], and he now has an A and a B setting.  He hates the A setting (doesn't walk well) and he "cranked" his own B setting up to 280 (assuming frequency) and this helped walking some but he still has problems with balance and freezing.  He is on mirapex, 1 mg tid.  He is on carbidopa/levodopa 25/100, 7:30am/11am/2pm/6pm.    11/15/13:  Pt returns today for follow up.  Overall, he has been doing well.  He is off of medication this AM for programming.  His wife reports that he has done better on his new setting.  Seems to be walking better with less dyskinesia.  Until this morning, he was still on the carbidopa/levodopa 25/100, 1 tablet 4 times per day, Azilect 1 mg daily and Mirapex 1 mg 3 times per day.  He is exercising faithfully.  He has not had any falls.  He has been shuffling a little more this  morning, since he is off of medication.  11/21/13 update:  The patient is returning for followup, earlier than expected.  The patient reports that he really had not been doing as well over the weekend.  He did not feel like he was walking as well. He felt like the setting "wore" off 30 mins before the next levodopa dosage.  His wife wonders if he wasn't doing as well because he was at the beach this past weekend and there were more stairs to negotiate.  No falls.  No hallucinations   He did decrease his carbidopa/levodopa 25/100, one tablet 3 times per day from one tablet 4 times per day.  He is still on Mirapex 1 mg 3 times per day and Azilect 1 mg daily.  12/20/13 update:  The patient returns today for followup, accompanied by his wife and daughter who supplements the history.  The patient is currently on Mirapex, 1 mg 3 times a day, Azilect 1 mg daily and carbidopa/levodopa 25/100 at 7:30 AM/11 AM/2 PM and 6:30 PM.  Overall, the patient states that he is doing much better on his current setting.  It is not seeming to "wear off."  He is having some difficulty in the middle of the night when he gets  up and sometimes has to use a cane, but otherwise is doing well.  No falls.  On one occasion, he did try to get up in the morning and go and take a walk and was able to walk a few miles without taking his medication, but then he needed it when he got home.  He has noticed some dyskinesia, but it has not been particularly bothersome.  He would like to decrease dyskinesia, if able.  03/20/14 update:  Pt is f//u today.  Overall doing well.  He tried to decrease his carbidopa/levodopa 25/100 from one tablet 4 times per day to 1/0.5/0.5/1, but ended up going back up to one tablet 4 times per day.  He finds that he is due for his second dose at 11 AM, but by 10:30 AM he is shuffling, and he thinks it is primarily the right foot.  When he goes hiking, he states that he takes an extra half tablet at 10:30 AM and as well and  often takes another half tablet in the afternoon.  He finds that protein interferes with the levodopa dosing.  He continues to take 2 Flexeril at night.  He does not know if it helps, but has not tried to stop that since the DBS surgery.  He finds that the only time he has dyskinesia is at the end of the dose.  He asks me if we can slightly increase the DBS stimulation.  He continues to exercise.  No falls.  No hallucinations.  05/18/14 update: The patient is following up today, accompanied by his wife who supplements the history.  I tried to slightly decrease the levodopa, but the patient is was not able to do that.  He remains on carbidopa/levodopa 25/100, 1 tablet 4 times per day.  He is also on Mirapex 1 mg 3 times per day along with Azilect 1 mg daily. C/o some "slight" freezing episodes and med wearing off 1/2 hr before due but at the same time having frequent dyskinesia of the R leg.  No falls.  Staying very active.  No hallucinations.  C/o cost of azilect and asks about its benefit.    08/08/14 update:  Overall, the patient is doing well.  He has not taken medication since 6:30 this morning, and wanted me to see him off of medication.  He states that he has some "spotty freezing."  He asks about potentially taking metformin to help him.  He also asks about levodopa nasal spray.  He asks about several other experimental treatments.  He has occasional dyskinesia in the right foot but it is not bothersome to him.  Overall, he feels slightly slower than he used to.  He states that he generally is supposed to take his levodopa at 11 AM but he needs it by 10:30.  If he is very active he will need to take a few extra half dosages of levodopa throughout the day.  Generally, however, he takes 4 tablets of carbidopa/levodopa 25/100 throughout the day in addition to entacapone 200 mg twice a day and Mirapex 1 mg 3 times a day.  He is off of Azilect and wants to know if he should go back on it.  He noticed no  difference when discontinuing it.  He is off of Flexeril as well as noticed no trouble with that.  10/10/14 update:  Overall, the patient is doing well.  He is on carbidopa/levodopa 25/100 4 times per day.  He takes entacapone 200 mg with the  first and third dose of the levodopa.  He really does not notice that it helps and states that he still feels "jerky" 30 minutes before his next dosage is due.  He will often just take the levodopa and then.  He is on Mirapex 1 mg 3 times a day.  I added carbidopa/levodopa 50/200 CR at night because of cramping and the patient states that it helps but he rarely takes it because he thinks the dyskinesia that worse when he did it.  He states that he uses it if he really needs to "get up and go."  He does use Flexeril at night for cramping and thinks it helped as much as the carbidopa/levodopa 50/200 CR.  He did turn down the DBS device somewhat since last visit because of dyskinesia.  He states that he takes Azilect "off and on" because he was told by a previous neurologist that it was neuroprotective.  He asks me about isradapine.  02/12/15 update:  The patient returns today for follow-up.  I have communicated with him several times since last visit.  He called with complaints of freezing or before his next dose and we ended up slightly increasing his levodopa so that he is taking 1-1/2 tablets in the morning, one tablet at his next dose, 1-1/2 tablets in the mid afternoon and then 1 tablet in the later evening.  This helped but he states that sometimes he will take 1 in the AM and the 1/2 an hour or 2 later.  He still only takes 3-4 a day.  He takes entacapone with every other dose of the levodopa.  He is on pramipexole 1 mg 3 times per day.  Dyskinesia has been problematic despite the fact he complains about freezing and we ended up adding amantadine, 100 mg 3 times a day for that. It helped that but caused constipation and dry mouth.   He had 2 falls since last visit, one  of which was at the outer edge of my office when he came to bring me something.  He quickly turned around while carrying something and fell to his knee.  He did not get hurt.   He states that if he gets in a crowded space his feet will just get tangled together.   He does know that if he gets into "shuffle mode" he needs to stop and "reset."  He states overall shuffling is much better.  He asked me about several studies since our last visit, but these are not available to him because of the fact he has already had DBS.  He asks me again about nilotinib study.  05/15/15 update:  The patient is following up today regarding his Parkinson's disease.  States that he has been "fantastic."  He did have a postoperative MRI done since our last visit.  This is first he has ever had since his surgery.  The [facility] plane is approximately at contact number 2 on the lead.  We are currently actively using contact 1.  We have used contact 2 in the past successfully.  He is currently on carbidopa/levodopa 25/100; he has gone down a bit on this to about 4 and sometimes 4.5 tablets a day.  Is on Comtan 200 mg twice a day, Mirapex 1 mg 3 times a day and amantadine 100 mg 3 times a day for dyskinesia.  If he gets nervous, his legs will "twitch."  He is exercising on the bike in addition to doing physical labor.  Dropped his own programming to 3.2 and thinks that it has helped.  Did have 1-2 falls when "I get in a hurry."  Never got hurt.  Notices more trouble when he is at end of dosage.  08/15/15 update:  The patient is following up today regarding his Parkinson's disease.  He is on carbidopa/levodopa 25/100, 4- tablets per day (and then will take 2 extra 1/2 tablets as needed) along with entacapone, 200 mg twice a day.  He also takes pramipexole, 1 mg 3 times per day and amantadine 100 mg 3 times a day for dyskinesia.   Amantadine is causing dry mouth and he is using biotene to help that.   He has called several times since last  visit asking me about medical marijuana and its place in the treatment of Parkinson's disease.  I have talked to him about the position statement by the [employer].  He has not fallen since our last visit.  He reports increased dyskinesia only when he has increased stress.  Reports that he changed his DBS setting again back to where I had it previously (3.4).  He uses 2 flexeril at night for cramping.  He brings me articles to read on natural "supplements" and also asks me to look up a center in [place] that is owned by a "chiropractic neurologist" and has week long "camps" for patient to have Parkinson's disease that are supposed to help with healing of Parkinson's disease and not paid by insurance.  12/13/15 update:  The patient is following up today regarding his Parkinson's disease.  He is on carbidopa/levodopa 25/100, 4-5 tablets per day along with entacapone, 200 mg twice a day.  He also takes pramipexole, 1 mg 3 times per day and amantadine 100 mg 3 times a day for dyskinesia.  I reviewed records since last visit.  Fell at home on concrete on 11/30/15 and injured L calf.  Saw PCP few days later. Is better now.  States that he has learned that he just has to "stop" when he has a festinating gait.   No hallucinations.  No lightheadedness.    PREVIOUS MEDICATIONS: Sinemet, Mirapex and azilect  ALLERGIES:   Allergies  Allergen Reactions  . Amoxicillin   . Celebrex [Celecoxib]   . Demerol Other (See Comments)    Hallucinations   . Penicillins     REACTION: RASH    CURRENT MEDICATIONS:  Current Outpatient Prescriptions on File Prior to Visit  Medication Sig Dispense Refill  . amantadine (SYMMETREL) 100 MG capsule TAKE ONE CAPSULE THREE TIMES A DAY 90 capsule 11  . carbidopa-levodopa (SINEMET IR) 25-100 MG tablet TAKE ONE TABLET FIVE TIMES A DAY 150 tablet 5  . clonazePAM (KLONOPIN) 0.5 MG tablet TAKE 1 TO 2 TABLETS AT BEDTIME AS NEEDED 60 tablet 0  . cyclobenzaprine  (FLEXERIL) 10 MG tablet Take 2 tablets (20 mg total) by mouth at bedtime. 60 tablet 5  . entacapone (COMTAN) 200 MG tablet TAKE ONE TABLET TWICE A DAY 60 tablet 5  . pramipexole (MIRAPEX) 1 MG tablet TAKE 1 TABLET 3 TIMES DAILY 90 tablet 5   No current facility-administered medications on file prior to visit.    PAST MEDICAL HISTORY:   Past Medical History  Diagnosis Date  . GERD (gastroesophageal reflux disease) Pre 2002    Mild  . Ruptured appendix teens  . History of kidney stones   . 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 Parkinson disease ([place]) 2004    Slowly progressive  . Arthritis     left hip     PAST SURGICAL HISTORY:   Past Surgical History  Procedure Laterality Date  . Neuro evaluation  12/19/2004    [name], benign tremor  . Doppler echocardiography  01/10/2009    LV NML Mild LVH EF 60-65% aortic sclerosis w/0 stenosis   . Appendectomy    . Joint replacement      left hip replacement 08/14/11/[facility]   . Total hip revision  08/27/2011    Procedure: TOTAL HIP REVISION;  Surgeon: Mauri Pole, MD;  Location: [facility];  Service: Orthopedics;  Laterality: Left;  . Deep brain stimulator placement  8/14    L STN    SOCIAL HISTORY:   Social History   Social History  . Marital Status: Married    Spouse Name: N/A  . Number of Children: 2  . Years of Education: N/A   Occupational History  . Optometrist     medically retired   Social History Main Topics  . Smoking status: Never Smoker   . Smokeless tobacco: Never Used  . Alcohol Use: 3.5 oz/week    7 drink(s) per week     Comment: occassionally  . Drug Use: No  . Sexual Activity: Yes   Other Topics Concern  . Not on file   Social History Narrative   Married, lives with wife   2 daughters      Has living will   Wife has health care POA---then daughters   Would still accept CPR--but no prolonged artificial means  (ventilator or tube feeds)    FAMILY HISTORY:   Family Status  Relation Status Death Age  . Mother Deceased 36    knee replacement  . Father Deceased 48    tremor  . Sister Engineer, mining in [place], [place]    ROS:  A complete 10 system review of systems was obtained and was unremarkable apart from what is mentioned above.  PHYSICAL EXAMINATION:    VITALS:   Filed Vitals:   12/13/15 1118  BP: 154/64  Pulse: 68  Height: 6' (1.829 m)  Weight: 170 lb (77.111 kg)    GEN:  The patient appears stated age and is in NAD. HEENT:  Normocephalic, atraumatic.  The mucous membranes are moist. The superficial temporal arteries are without ropiness or tenderness. CV: RRR Lungs:  CTAB Neck: no bruits   Neurological examination:  Orientation: The patient is alert and oriented x3.  Cranial nerves: There is good facial symmetry.  There is minimal facial hypomimia.  Pupils are equal round and reactive to light bilaterally.  The speech is slightly dysarthric but fluent. Soft palate rises symmetrically and there is no tongue deviation. Hearing is intact to conversational tone. Motor: Strength is 5/5 in the bilateral upper and lower extremities.   Shoulder shrug is equal and symmetric.  There is no pronator drift.  Movement examination: Tone: There is normal tone in the bilateral upper extremities.  The tone in the lower extremities is normal.  Abnormal movements: There is significant dyskinesia in the R leg Coordination:  There is good RAMs today Gait and Station: The patient has no difficulty arising out of a deep-seated chair and he "runs" down the hall and is short stepped.  He does have start hesitation.  Dyskinesia of the R arm with walking.  Is unstable and runs down the  hall even though several times asked to slow down and just walk  DBS programming was performed today which is described in more detail on a separate programming procedural notes.    ASSESSMENT/PLAN:  1.  Parkinson's disease.    -The patient is status post left STN DBS at [facility] on 03/14/2013.  -remain on the carbidopa/levodopa 25/100, which he has gone down on to 4-5 tablets/day and comtan 200 mg bid  -For now, he will remain on the Mirapex, 1 mg 3 times per day and amantadine 100 mg tid, which is helping dyskinesia.  He has some dry mouth with the amantadine, but uses Biotene.  -I did talk to patient re: diagnosis.  I would have loved to seen him early on in the disease.  By definition, patients with PD have to have sx's on the "good" side of the body within 3 years of diagnosis, and he has never had sx's on the opposite side.  This has really called into question, for me, the diagnosis but I cannot deny that he has done much better post DBS  -refer him to neurorehab center for balance therapy.    -DBS programming was performed today.   2.  Constipation  -Was made worse by amantadine.  He is using the rancho recipe.  He is using Biotene for the dry mouth but thinks that is from the amantadine as well and it may be. 3.  Much greater than 50% of the 25 minute visit (doesn't include DBS time) was in counseling and coordinating care, as above.

## 2015-12-13 NOTE — Patient Instructions (Signed)
1. You have been referred to [facility] for physical therapy. They will call you directly to schedule an appointment.  Please call [phone] if you do not hear from them.

## 2015-12-17 ENCOUNTER — Telehealth: Payer: Self-pay | Admitting: Neurology

## 2015-12-17 NOTE — Telephone Encounter (Signed)
Patient wanted to know if he really needed physical therapy. I let him know it was recommended by Dr Tat. He will call back to schedule this and let us know if he has any other questions.

## 2015-12-17 NOTE — Telephone Encounter (Signed)
Casey Tucker [birth year]. He called and would like you to call him back please at [phone] or you can leave a message on [phone]. Thank you

## 2015-12-18 ENCOUNTER — Ambulatory Visit: Payer: Medicare Other | Attending: Neurology | Admitting: Physical Therapy

## 2015-12-18 DIAGNOSIS — R29818 Other symptoms and signs involving the nervous system: Secondary | ICD-10-CM

## 2015-12-18 DIAGNOSIS — R293 Abnormal posture: Secondary | ICD-10-CM

## 2015-12-18 DIAGNOSIS — R2681 Unsteadiness on feet: Secondary | ICD-10-CM

## 2015-12-18 DIAGNOSIS — R2689 Other abnormalities of gait and mobility: Secondary | ICD-10-CM | POA: Diagnosis not present

## 2015-12-19 NOTE — Therapy (Signed)
[facility] [address] [place], [place], [address] Phone: [phone]   Fax:  [phone]  Physical Therapy Evaluation  Patient Details  Name: Casey Tucker MRN: [id number] Date of Birth: November 09, 1940 Referring Provider: Tat  Encounter Date: 12/18/2015      PT End of Session - 12/19/15 0751    Visit Number 1   Number of Visits 9   Date for PT Re-Evaluation 02/16/16   Authorization Type Medicare GCODE every 10th visit   PT Start Time 1018   PT Stop Time 1104   PT Time Calculation (min) 46 min   Activity Tolerance Patient tolerated treatment well   Behavior During Therapy [facility] for tasks assessed/performed      Past Medical History  Diagnosis Date  . GERD (gastroesophageal reflux disease) Pre 2002    Mild  . Ruptured appendix teens  . History of kidney stones   . 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 Parkinson disease ([place]) 2004    Slowly progressive  . Arthritis     left hip     Past Surgical History  Procedure Laterality Date  . Neuro evaluation  12/19/2004    [name], benign tremor  . Doppler echocardiography  01/10/2009    LV NML Mild LVH EF 60-65% aortic sclerosis w/0 stenosis   . Appendectomy    . Joint replacement      left hip replacement 08/14/11/[place]   . Total hip revision  08/27/2011    Procedure: TOTAL HIP REVISION;  Surgeon: Mauri Pole, MD;  Location: [facility];  Service: Orthopedics;  Laterality: Left;  . Deep brain stimulator placement  8/14    L STN    There were no vitals filed for this visit.       Subjective Assessment - 12/18/15 1023    Subjective Pt is a 75 year old male who presents to OP PT evaluation upon recommendation of Dr. Carles Collet.  He reports at least 3-4 falls in the past 6 months.  Unsure of what happened with those falls.  He uses a walker at night for safety.  He notes episodes with  freezing, more when medication is waning   Pertinent History PD x 11 years per pt report.  Deep brain stimulator (L brain) placed 2014; "difficulty with 'blending' with stress or on/off times of medications"   Patient Stated Goals Pt's goal for therapy is to help with or prevent with freezing episodes   Currently in Pain? No/denies            [facility] PT Assessment - 12/18/15 1028    Assessment   Medical Diagnosis Parkinson's disease   Referring Provider Tat   Onset Date/Surgical Date --  12/13/15 MD visit with DBS setting changes   Precautions   Precautions Fall   Balance Screen   Has the patient fallen in the past 6 months Yes   How many times? 3-4   Has the patient had a decrease in activity level because of a fear of falling?  No   Is the patient reluctant to leave their home because of a fear of falling?  No   Home Social worker Private residence   Living Arrangements Spouse/significant other   Available Help at Discharge Family   Type of [place] to enter   Entrance Stairs-Number of Steps 3   Entrance  Stairs-Rails Right   Home Layout One level   Home Equipment Other (comment);Cane - single point  Nikolai with 3 wheels   Prior Function   Level of Independence Independent with basic ADLs;Independent with household mobility without device;Independent with community mobility without device   Leisure Enjoys hunting, fishing, outdoor activities.  Exercise 2-3 times per week with trainer/at gym  Recumbent bike 15-20 minutes per day   Observation/Other Assessments   Focus on Therapeutic Outcomes (FOTO)  NA   Posture/Postural Control   Posture/Postural Control Postural limitations   Postural Limitations Rounded Shoulders;Forward head;Posterior pelvic tilt   Posture Comments Sits in posterior pelvic tilt during MMT of lower extremities   Tone   Assessment Location Other (comment)  dyskinesias at times in RUE and RLE   ROM / Strength    AROM / PROM / Strength Strength   Strength   Overall Strength Comments grossly tested at least 4/5 bilateral lower extremities  pt sits in posterior pelvic tilt against chair during MMT   Transfers   Transfers Sit to Stand;Stand to Sit   Sit to Stand 4: Min guard;5: Supervision;Without upper extremity assist;From chair/3-in-1   Five time sit to stand comments  7.57  decr. anterior/forward lean; very fast pace   Stand to Sit 4: Min guard;5: Supervision;Without upper extremity assist;To chair/3-in-1   Comments slight posterior pull/lean upon attempts to stand   Ambulation/Gait   Ambulation/Gait Yes   Ambulation/Gait Assistance 5: Supervision   Ambulation/Gait Assistance Details Increased dyskinesias noted in RUE and trunk during increased gait challenges.  Freezing gait noted upon initiation of gait with obstacle; festinating/freezing noted with turns   Ambulation Distance (Feet) 200 Feet   Assistive device None   Gait Pattern Step-through pattern;Decreased arm swing - right;Decreased arm swing - left;Decreased step length - right;Decreased step length - left;Shuffle;Festinating;Trunk flexed;Narrow base of support;Poor foot clearance - left;Poor foot clearance - right  festinating/decr. foot clearance with turns   Ambulation Surface Level;Indoor   Gait velocity 12.12= 2.71 ft/sec   Gait Comments reports difficulty with starting/stopping quickly   Standardized Balance Assessment   Standardized Balance Assessment Timed Up and Go Test;Four Square Step Test   Four Square Step Test Trial One  11.80 sec   Timed Up and Go Test   Normal TUG (seconds) 8.88   Manual TUG (seconds) 12.61  festinating with turns   Cognitive TUG (seconds) 14.07  festinating episodes with turns   Four Square Step Test    Trial One  11.80   Functional Gait  Assessment   Gait assessed  Yes   Gait Level Surface Walks 20 ft, slow speed, abnormal gait pattern, evidence for imbalance or deviates 10-15 in outside of the  12 in walkway width. Requires more than 7 sec to ambulate 20 ft.  7.28   Change in Gait Speed Makes only minor adjustments to walking speed, or accomplishes a change in speed with significant gait deviations, deviates 10-15 in outside the 12 in walkway width, or changes speed but loses balance but is able to recover and continue walking.   Gait with Horizontal Head Turns Performs head turns with moderate changes in gait velocity, slows down, deviates 10-15 in outside 12 in walkway width but recovers, can continue to walk.   Gait with Vertical Head Turns Performs task with moderate change in gait velocity, slows down, deviates 10-15 in outside 12 in walkway width but recovers, can continue to walk.   Gait and Pivot Turn Pivot turns safely within 3  sec and stops quickly with no loss of balance.   Step Over Obstacle Is able to step over one shoe box (4.5 in total height) but must slow down and adjust steps to clear box safely. May require verbal cueing.   Gait with Narrow Base of Support Ambulates 7-9 steps.   Gait with Eyes Closed Walks 20 ft, slow speed, abnormal gait pattern, evidence for imbalance, deviates 10-15 in outside 12 in walkway width. Requires more than 9 sec to ambulate 20 ft.   Ambulating Backwards Walks 20 ft, uses assistive device, slower speed, mild gait deviations, deviates 6-10 in outside 12 in walkway width.  8.12 incr. dyskinesias   Steps Alternating feet, must use rail.   Total Score 15   FGA comment: Scores <15/30 indicates increased fall risk.       Posterior push and release test:  Pt takes one step in posterior direction, but has excess trunk movement during push and release test.                    PT Education - 12/19/15 0748    Education provided Yes   Education Details Discussed POC-need to address quality and timing/pacing of movement and how to begin to address freezing of gait; discussed stopping and resetting and standing with wide BOS as  strategies to help decrease freezing episodes.   Person(s) Educated Patient   Methods Explanation;Demonstration   Comprehension Verbalized understanding;Returned demonstration             PT Long Term Goals - 12/19/15 0853    PT LONG TERM GOAL #1   Title Pt will be independent with HEP for improved balance, transfers, gait.  TARGET 01/17/16   Time 4   Period Weeks   Status New   PT LONG TERM GOAL #2   Title Pt will perform at least 8 of 10 reps of sit<>stand transfers from 18" surfaces or below, no UE support, with no posterior lean for improved efficiency and safety with transfers.   Time 4   Period Weeks   Status New   PT LONG TERM GOAL #3   Title Pt will perform TUG cognitive score within 10% time of TUG score, to demonstrate improved dual tasking with gait.   Time 4   Period Weeks   PT LONG TERM GOAL #4   Title Pt will improve Functional Gait Assessment to at least 19/30 for decreased fall risk.   Time 4   Period Weeks   Status New   PT LONG TERM GOAL #5   Title Pt will verbalize/demonstrate understanding of techniques to reduce freezing episodes with gait and turns.   Time 4   Period Weeks   Status New               Plan - 12/19/15 0752    Clinical Impression Statement Pt is a 75 year old male who presents to OP PT with Parkinson's disease, with deep brain stimulator placement in 2014.  Pt has undergone adjustments recently to DBS, and pt continues to experience dyskinesias and freezing episodes with gait.  Pt has had 3-4 falls in the past 6 months.  Pt presents to OP PT with decreased timing and coordination with gait, decreased balance, postural abnormality, postural instability, dyskinesias, freezing and festinating with gait and turns.  Pt will benefit from skilled PT to address the above stated deficits to improve functional mobility and decrease fall risk.   Rehab Potential Good   PT Frequency  2x / week   PT Duration 4 weeks  plus eval   PT  Treatment/Interventions ADLs/Self Care Home Management;Balance training;Neuromuscular re-education;Patient/family education;Gait training;Functional mobility training;Therapeutic activities;Therapeutic exercise   PT Next Visit Plan Techniques to reduce freezing episodes with gait and turns, initiate PWR! MOves in sitting and standing-timing, pacing of movement patterns   Consulted and Agree with Plan of Care Patient      Patient will benefit from skilled therapeutic intervention in order to improve the following deficits and impairments:  Abnormal gait, Decreased balance, Decreased mobility, Decreased safety awareness, Difficulty walking, Decreased strength, Postural dysfunction  Visit Diagnosis: Other abnormalities of gait and mobility  Abnormal posture  Unsteadiness on feet  Other symptoms and signs involving the nervous system      G-Codes - January 16, 2016 0856    Functional Assessment Tool Used 8.88 sec TUG, 14.07 TUG cognitive; Functional Gait Assessment 15/30, 3-4 falls in past 6 months   Functional Limitation Mobility: Walking and moving around   Mobility: Walking and Moving Around Current Status [phone]) At least 20 percent but less than 40 percent impaired, limited or restricted   Mobility: Walking and Moving Around Goal Status [phone]) At least 1 percent but less than 20 percent impaired, limited or restricted       Problem List Patient Active Problem List   Diagnosis Date Noted  . Superficial injury of left leg 12/04/2015  . Advanced directives, counseling/discussion 04/20/2014  . Routine general medical examination at a health care facility 03/16/2012  . 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 Osteoarthrosis, hip 08/12/2011  . Parkinson disease ([place]) 01/29/2011  . Aortic sclerosis ([place]) 01/01/2009  . GILBERT'S SYNDROME 12/09/2007  . GERD 12/09/2007  . RENAL CALCULUS, HX OF 12/09/2007    MARRIOTT,AMY W. 12/19/2015, 8:58 AM  Frazier Butt., PT [facility] [address] [place], [place], [address] Phone: [phone]   Fax:  [phone]  Name: GUMARO KULIKOWSKI MRN: [id number] Date of Birth: 20-Feb-1941

## 2015-12-30 ENCOUNTER — Telehealth: Payer: Self-pay | Admitting: Neurology

## 2015-12-30 NOTE — Telephone Encounter (Signed)
PT called and would like a call back/Dawn CB# [phone]

## 2015-12-30 NOTE — Telephone Encounter (Signed)
Spoke with patient and he wanted to know if his PT evaluation would be available on mychart- I let him know that I wasn't sure but he could check with that department.

## 2015-12-31 ENCOUNTER — Ambulatory Visit: Payer: Medicare Other | Admitting: Physical Therapy

## 2016-01-01 ENCOUNTER — Ambulatory Visit: Payer: Medicare Other | Attending: Neurology | Admitting: Physical Therapy

## 2016-01-01 DIAGNOSIS — R29818 Other symptoms and signs involving the nervous system: Secondary | ICD-10-CM | POA: Insufficient documentation

## 2016-01-01 DIAGNOSIS — R2689 Other abnormalities of gait and mobility: Secondary | ICD-10-CM | POA: Diagnosis not present

## 2016-01-01 DIAGNOSIS — R293 Abnormal posture: Secondary | ICD-10-CM | POA: Diagnosis not present

## 2016-01-01 DIAGNOSIS — R2681 Unsteadiness on feet: Secondary | ICD-10-CM | POA: Insufficient documentation

## 2016-01-01 NOTE — Patient Instructions (Addendum)
Tips to reduce freezing episodes with standing or walking:  1. Stand tall with your feet wide, so that you can rock and weight shift through your hips. 2. Don't try to fight the freeze: if you begin taking slower, faster, smaller steps, STOP, get your posture tall, and RESET your posture and balance.  Take a deep breath before taking the BIG step to start again. 3. March in place, with high knee stepping, to get started walking again. 4. Use auditory cues:  Count out loud, think of a familiar tune or song or cadence, use pocket metronome, to use rhythm to get started walking again. 5. Use visual cues:  Use a line to step over, use laser pointer line to step over, (using BIG steps) to start walking again. 6. Use visual targets to keep your posture tall (look ahead and focus on an object or target at eye level). 7. As you approach where your destination with walking, count your steps out loud and/or focus on your target with your eyes until you are fully there. 8. Use appropriate assistive device, as advised by your physical therapist to assist with taking longer, consistent steps.    -PWR! Moves provided in sitting and in standing, x 20 reps; to be performed once daily at home. -Provided pt with how to access videos of PWR! Moves exercises on You Tube to help with timing and coordination of exercises.

## 2016-01-01 NOTE — Therapy (Signed)
[facility] [address] [place], [place], [address] Phone: [phone]   Fax:  [phone]  Physical Therapy Treatment  Patient Details  Name: Casey Tucker MRN: [id number] Date of Birth: Oct 18, 1940 Referring Provider: Tat  Encounter Date: 01/01/2016      PT End of Session - 01/01/16 1222    Visit Number 2   Number of Visits 9   Date for PT Re-Evaluation 02/16/16   Authorization Type Medicare GCODE every 10th visit   PT Start Time 1059   PT Stop Time 1147   PT Time Calculation (min) 48 min   Activity Tolerance Patient tolerated treatment well   Behavior During Therapy [facility] for tasks assessed/performed      Past Medical History  Diagnosis Date  . GERD (gastroesophageal reflux disease) Pre 2002    Mild  . Ruptured appendix teens  . History of kidney stones   . 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 Parkinson disease ([place]) 2004    Slowly progressive  . Arthritis     left hip     Past Surgical History  Procedure Laterality Date  . Neuro evaluation  12/19/2004    [name], benign tremor  . Doppler echocardiography  01/10/2009    LV NML Mild LVH EF 60-65% aortic sclerosis w/0 stenosis   . Appendectomy    . Joint replacement      left hip replacement 08/14/11/[place]   . Total hip revision  08/27/2011    Procedure: TOTAL HIP REVISION;  Surgeon: Mauri Pole, MD;  Location: [facility];  Service: Orthopedics;  Laterality: Left;  . Deep brain stimulator placement  8/14    L STN    There were no vitals filed for this visit.      Subjective Assessment - 01/01/16 1101    Subjective No new things to report; no changes or falls.   Pertinent History PD x 11 years per pt report.  Deep brain stimulator (L brain) placed 2014; "difficulty with 'blending' with stress or on/off times of medications"   Patient Stated Goals Pt's goal for  therapy is to help with or prevent with freezing episodes   Currently in Pain? No/denies                         [facility] Adult PT Treatment/Exercise - 01/01/16 0001    Ambulation/Gait   Ambulation/Gait Yes   Ambulation/Gait Assistance 5: Supervision   Ambulation/Gait Assistance Details Gait with starts/stops, with cues for foot placement.  Gait negotiating narrow spaces with cues for increased step length, upright posture.     Ambulation Distance (Feet) 400 Feet  then 150   Assistive device None   Gait Pattern Step-through pattern;Decreased arm swing - right;Decreased arm swing - left;Decreased step length - right;Decreased step length - left;Shuffle;Trunk flexed;Narrow base of support;Poor foot clearance - left;Poor foot clearance - right   Ambulation Surface Level;Indoor   Gait Comments Practiced gait with turning, including figure-8 turns, wide U-turns, marching turns, weight shifting turns, and "clock-face" turns.  Pt tends to prefer clock-face turn method, with cues provided for equal weightshifting and lifting feet for optimal safety with turns.           PWR [facility]) - 01/01/16 1103    PWR! exercises Moves in sitting;Moves in standing   PWR! Up x 10   PWR! Rock x 10  PWR! Twist  x 10   PWR Step  x 10   Comments Verbal and visual cues provided verbal and visual cues for technique, for deliberate movement patterns   PWR! Up x 10   PWR! Rock x 10   PWR! Twist x10   PWR! Step x 10   Comments Verbal and visual cues provided for technique and for deliberate, large, paced movements      Discussed functional movements and activities associated with each PWR! Move position and exercise.         PT Education - 01/01/16 1218    Education provided Yes   Education Details Initiated HEP-PWR! Moves in sitting and standing; techniques to reduce freezing with gait gait; turning techniques   Person(s) Educated Patient   Methods Explanation;Demonstration;Handout    Comprehension Verbalized understanding;Returned demonstration;Verbal cues required             PT Long Term Goals - 12/19/15 0853    PT LONG TERM GOAL #1   Title Pt will be independent with HEP for improved balance, transfers, gait.  TARGET 01/17/16   Time 4   Period Weeks   Status New   PT LONG TERM GOAL #2   Title Pt will perform at least 8 of 10 reps of sit<>stand transfers from 18" surfaces or below, no UE support, with no posterior lean for improved efficiency and safety with transfers.   Time 4   Period Weeks   Status New   PT LONG TERM GOAL #3   Title Pt will perform TUG cognitive score within 10% time of TUG score, to demonstrate improved dual tasking with gait.   Time 4   Period Weeks   PT LONG TERM GOAL #4   Title Pt will improve Functional Gait Assessment to at least 19/30 for decreased fall risk.   Time 4   Period Weeks   Status New   PT LONG TERM GOAL #5   Title Pt will verbalize/demonstrate understanding of techniques to reduce freezing episodes with gait and turns.   Time 4   Period Weeks   Status New               Plan - 01/01/16 1222    Clinical Impression Statement Treatment session focused on initiating HEP for PWR! Moves for optimal movement patterns for sitting and standing.  Also focused on turning strategies and tips to reduce freezing with gait, per pt request.  Pt responds well to cues for turning strategies, but needs occasional cues for slowed, pacing of movements, especially with exercises.     Rehab Potential Good   PT Frequency 2x / week   PT Duration 4 weeks  plus eval   PT Treatment/Interventions ADLs/Self Care Home Management;Balance training;Neuromuscular re-education;Patient/family education;Gait training;Functional mobility training;Therapeutic activities;Therapeutic exercise   PT Next Visit Plan Review HEP, review turning strategies and tips to reduce freezing.  Practice balance strategies-hip, ankle, stepping in varied directions    Consulted and Agree with Plan of Care Patient      Patient will benefit from skilled therapeutic intervention in order to improve the following deficits and impairments:  Abnormal gait, Decreased balance, Decreased mobility, Decreased safety awareness, Difficulty walking, Decreased strength, Postural dysfunction  Visit Diagnosis: Other abnormalities of gait and mobility  Abnormal posture     Problem List Patient Active Problem List   Diagnosis Date Noted  . Superficial injury of left leg 12/04/2015  . Advanced directives, counseling/discussion 04/20/2014  . Routine general medical examination at  a health care facility 03/16/2012  . 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 Osteoarthrosis, hip 08/12/2011  . Parkinson disease ([place]) 01/29/2011  . Aortic sclerosis ([place]) 01/01/2009  . GILBERT'S SYNDROME 12/09/2007  . GERD 12/09/2007  . RENAL CALCULUS, HX OF 12/09/2007    Blakelynn Scheeler W. 01/01/2016, 12:26 PM  Frazier Butt., PT [facility] [address] [place], [place], [address] Phone: [phone]   Fax:  [phone]  Name: Casey Tucker MRN: [id number] Date of Birth: 01-26-1941

## 2016-01-02 ENCOUNTER — Ambulatory Visit: Payer: Medicare Other | Admitting: Physical Therapy

## 2016-01-02 DIAGNOSIS — R29818 Other symptoms and signs involving the nervous system: Secondary | ICD-10-CM | POA: Diagnosis not present

## 2016-01-02 DIAGNOSIS — R293 Abnormal posture: Secondary | ICD-10-CM | POA: Diagnosis not present

## 2016-01-02 DIAGNOSIS — R2689 Other abnormalities of gait and mobility: Secondary | ICD-10-CM | POA: Diagnosis not present

## 2016-01-02 DIAGNOSIS — R2681 Unsteadiness on feet: Secondary | ICD-10-CM

## 2016-01-02 NOTE — Therapy (Signed)
[facility] [address] [place], [place], [address] Phone: [phone]   Fax:  [phone]  Physical Therapy Treatment  Patient Details  Name: Casey Tucker MRN: [id number] Date of Birth: 1940/09/05 Referring Provider: Tat  Encounter Date: 01/02/2016      PT End of Session - 01/02/16 1301    Visit Number 3   Number of Visits 9   Date for PT Re-Evaluation 02/16/16   Authorization Type Medicare GCODE every 10th visit   PT Start Time 1105   PT Stop Time 1146   PT Time Calculation (min) 41 min   Equipment Utilized During Treatment Gait belt   Activity Tolerance Patient tolerated treatment well   Behavior During Therapy [facility] for tasks assessed/performed      Past Medical History  Diagnosis Date  . GERD (gastroesophageal reflux disease) Pre 2002    Mild  . Ruptured appendix teens  . History of kidney stones   . 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 Parkinson disease ([place]) 2004    Slowly progressive  . Arthritis     left hip     Past Surgical History  Procedure Laterality Date  . Neuro evaluation  12/19/2004    [name], benign tremor  . Doppler echocardiography  01/10/2009    LV NML Mild LVH EF 60-65% aortic sclerosis w/0 stenosis   . Appendectomy    . Joint replacement      left hip replacement 08/14/11/[place]   . Total hip revision  08/27/2011    Procedure: TOTAL HIP REVISION;  Surgeon: Mauri Pole, MD;  Location: [facility];  Service: Orthopedics;  Laterality: Left;  . Deep brain stimulator placement  8/14    L STN    There were no vitals filed for this visit.      Subjective Assessment - 01/02/16 1110    Subjective Denies falls or changes.   Pertinent History PD x 11 years per pt report.  Deep brain stimulator (L brain) placed 2014; "difficulty with 'blending' with stress or on/off times of medications"   Patient  Stated Goals Pt's goal for therapy is to help with or prevent with freezing episodes   Currently in Pain? No/denies                         [facility] Adult PT Treatment/Exercise - 01/02/16 0001    Transfers   Transfers Sit to Stand;Stand to Sit   Sit to Stand 5: Supervision;Without upper extremity assist   Stand to Sit 5: Supervision;Without upper extremity assist   Comments slight posterior pull/lean upon attempts to stand   Ambulation/Gait   Ambulation/Gait Yes   Ambulation/Gait Assistance 5: Supervision;4: Min guard   Ambulation Distance (Feet) 600 Feet  plus 5 minutes on treadmill gait trainer   Assistive device None  treadmill with UE support   Gait Pattern Step-through pattern;Decreased arm swing - right;Decreased arm swing - left;Decreased step length - right;Decreased step length - left;Shuffle;Trunk flexed;Narrow base of support;Poor foot clearance - left;Poor foot clearance - right   Ambulation Surface Level;Indoor   Pre-Gait Activities Forward, Backward and forward<>backward step weight shifting at counter with moderate cues to maintain technique and cadence   Gait Comments Practiced "clock-face" turns both directions with chairs as targets/UE support.  Needs cues to slow down and for full weight shift and turns; Treadmill with gait trainer working  on R heel strike, increased step length and decreasing cadence working on technique;treadmill kicking blue therapy ball at .7 mph to increase heel strike and step length.  Gait on level ground working on same as well as armswing (decrease bil UE but L worse than R)  Dyskinesia noted during entire session and several episodes of freezing.               PT Education - 01/02/16 1240    Education provided Yes   Education Details turn techniques and importance of weight shifting, heel strike and increased step length and armswing with gait, decreasing hastening of movements   Person(s) Educated Patient   Methods  Explanation;Demonstration   Comprehension Verbalized understanding;Other (comment)  needs continued reinforcement             PT Long Term Goals - 12/19/15 0853    PT LONG TERM GOAL #1   Title Pt will be independent with HEP for improved balance, transfers, gait.  TARGET 01/17/16   Time 4   Period Weeks   Status New   PT LONG TERM GOAL #2   Title Pt will perform at least 8 of 10 reps of sit<>stand transfers from 18" surfaces or below, no UE support, with no posterior lean for improved efficiency and safety with transfers.   Time 4   Period Weeks   Status New   PT LONG TERM GOAL #3   Title Pt will perform TUG cognitive score within 10% time of TUG score, to demonstrate improved dual tasking with gait.   Time 4   Period Weeks   PT LONG TERM GOAL #4   Title Pt will improve Functional Gait Assessment to at least 19/30 for decreased fall risk.   Time 4   Period Weeks   Status New   PT LONG TERM GOAL #5   Title Pt will verbalize/demonstrate understanding of techniques to reduce freezing episodes with gait and turns.   Time 4   Period Weeks   Status New               Plan - 01/02/16 1302    Clinical Impression Statement Pt needs frequent cues for decreasing speed of movements and gait as well as technique.  Moderate cues needed through out weight shifting and quarter turns.  Continue PT per POC.   Rehab Potential Good   PT Frequency 2x / week   PT Duration 4 weeks  plus eval   PT Treatment/Interventions ADLs/Self Care Home Management;Balance training;Neuromuscular re-education;Patient/family education;Gait training;Functional mobility training;Therapeutic activities;Therapeutic exercise   PT Next Visit Plan Review HEP.  Practice balance strategies-hip, ankle, stepping in varied directions.  Establish walking program.   Consulted and Agree with Plan of Care Patient      Patient will benefit from skilled therapeutic intervention in order to improve the following  deficits and impairments:  Abnormal gait, Decreased balance, Decreased mobility, Decreased safety awareness, Difficulty walking, Decreased strength, Postural dysfunction  Visit Diagnosis: Other abnormalities of gait and mobility  Abnormal posture  Unsteadiness on feet  Other symptoms and signs involving the nervous system     Problem List Patient Active Problem List   Diagnosis Date Noted  . Superficial injury of left leg 12/04/2015  . Advanced directives, counseling/discussion 04/20/2014  . Routine general medical examination at a health care facility 03/16/2012  . 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 Osteoarthrosis, hip 08/12/2011  . Parkinson disease ([place]) 01/29/2011  . Aortic sclerosis ([place]) 01/01/2009  .  GILBERT'S SYNDROME 12/09/2007  . GERD 12/09/2007  . RENAL CALCULUS, HX OF 12/09/2007    Narda Bonds 01/02/2016, 1:04 PM  [facility] [address] [place], [place], [address] Phone: [phone]   Fax:  [phone]  Name: DYLAND CARCANO MRN: [id number] Date of Birth: [birth year]    Narda Bonds, [place] 01/02/2016 1:04 PM Phone: [phone] Fax: [phone]

## 2016-01-07 ENCOUNTER — Ambulatory Visit: Payer: Medicare Other | Admitting: Physical Therapy

## 2016-01-07 DIAGNOSIS — R2689 Other abnormalities of gait and mobility: Secondary | ICD-10-CM | POA: Diagnosis not present

## 2016-01-07 DIAGNOSIS — R2681 Unsteadiness on feet: Secondary | ICD-10-CM | POA: Diagnosis not present

## 2016-01-07 DIAGNOSIS — R293 Abnormal posture: Secondary | ICD-10-CM | POA: Diagnosis not present

## 2016-01-07 DIAGNOSIS — R29818 Other symptoms and signs involving the nervous system: Secondary | ICD-10-CM | POA: Diagnosis not present

## 2016-01-07 NOTE — Therapy (Signed)
[facility] [address] [place], [place], [address] Phone: [phone]   Fax:  [phone]  Physical Therapy Treatment  Patient Details  Name: Casey Tucker MRN: [id number] Date of Birth: [birth year] Referring Provider: Tat  Encounter Date: 01/07/2016      PT End of Session - 01/07/16 1511    Visit Number 4   Number of Visits 9   Date for PT Re-Evaluation 02/16/16   Authorization Type Medicare GCODE every 10th visit   PT Start Time 1151   PT Stop Time 1231   PT Time Calculation (min) 40 min   Equipment Utilized During Treatment Gait belt   Activity Tolerance Patient tolerated treatment well   Behavior During Therapy [facility] for tasks assessed/performed      Past Medical History  Diagnosis Date  . GERD (gastroesophageal reflux disease) Pre 2002    Mild  . Ruptured appendix teens  . History of kidney stones   . 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 Parkinson disease ([place]) 2004    Slowly progressive  . Arthritis     left hip     Past Surgical History  Procedure Laterality Date  . Neuro evaluation  12/19/2004    [name], benign tremor  . Doppler echocardiography  01/10/2009    LV NML Mild LVH EF 60-65% aortic sclerosis w/0 stenosis   . Appendectomy    . Joint replacement      left hip replacement 08/14/11/[facility]   . Total hip revision  08/27/2011    Procedure: TOTAL HIP REVISION;  Surgeon: Mauri Pole, MD;  [facility];  Service: Orthopedics;  Laterality: Left;  . Deep brain stimulator placement  8/14    L STN    There were no vitals filed for this visit.      Subjective Assessment - 01/07/16 1152    Subjective Had a great weekend.  Tips for turns have been helpful.   Pertinent History PD x 11 years per pt report.  Deep brain stimulator (L brain) placed 2014; "difficulty with 'blending' with stress or on/off  times of medications"   Patient Stated Goals Pt's goal for therapy is to help with or prevent with freezing episodes   Currently in Pain? No/denies                         [facility] Adult PT Treatment/Exercise - 01/07/16 0001    Ambulation/Gait   Ambulation/Gait Yes   Ambulation/Gait Assistance 5: Supervision;4: Min guard   Ambulation/Gait Assistance Details Gait training with bilateral walking poles to facilitate reciprocal armswing x 120 ft, then cues for relaxed arm swing with cues for facilitation at shoulders for reciprocal arm swing.  Pt has excessive R arm swing, despite cues for relaxed arm swing.  Pt also noted to have increased toeing out with RLE today.   Ambulation Distance (Feet) 100 Feet  then 120, then 120   Assistive device None  bilateral walking poles   Gait Pattern Step-through pattern;Decreased arm swing - right;Decreased arm swing - left;Decreased step length - right;Decreased step length - left;Shuffle;Trunk flexed;Narrow base of support;Poor foot clearance - left;Poor foot clearance - right   Ambulation Surface Level;Indoor   Gait Comments Dykinesia noted throughout session in RUE and RLE> L side during gait and balance activities.   High Level Balance   High Level Balance Comments Forward step  and weight shift x 10 reps each side at counter with intermittent UE support.  Pt needs cues for deliberate step length and height, regain balance during return to start position.  Back step and weigthshift x 10 reps each leg with UE support at counter, with cues for proper technique with weigthshifting.  Attempted stagger stance forward/back weightshifting x 10 reps with UE support, with pt demonstrating difficulty with attempts to coordinate UE movements with weightshifting.           [facility]) - 01/07/16 1459    PWR! exercises Moves in sitting;Moves in standing   PWR! Up x 20   PWR! Rock x 20    PWR! Twist x 20  Cues for slowed, paced, deliberate movement    PWR Step  x20   Comments Pt needs occasional cues for deliberate movement patterns   PWR! Up x 20   PWR! Rock x 20    PWR! Twist x 20  Needs cues to slow pace, for deliberate start/stop pattern   PWR! Step x 20  Initial cues for slowed, deliberate pace      PWR! UP for posture, PWR! Rock for [employer] [place]! Twist for trunk rotation, and PWR! Step for step initiation and balance strategies.  Cues provided throughout exercises for slowed, deliberate pace.       PT Education - 01/07/16 1511    Education provided Yes   Education Details HEP-see instructions   Person(s) Educated Patient   Methods Explanation;Demonstration;Handout   Comprehension Verbalized understanding;Returned demonstration;Verbal cues required;Need further instruction             PT Long Term Goals - 12/19/15 0853    PT LONG TERM GOAL #1   Title Pt will be independent with HEP for improved balance, transfers, gait.  TARGET 01/17/16   Time 4   Period Weeks   Status New   PT LONG TERM GOAL #2   Title Pt will perform at least 8 of 10 reps of sit<>stand transfers from 18" surfaces or below, no UE support, with no posterior lean for improved efficiency and safety with transfers.   Time 4   Period Weeks   Status New   PT LONG TERM GOAL #3   Title Pt will perform TUG cognitive score within 10% time of TUG score, to demonstrate improved dual tasking with gait.   Time 4   Period Weeks   PT LONG TERM GOAL #4   Title Pt will improve Functional Gait Assessment to at least 19/30 for decreased fall risk.   Time 4   Period Weeks   Status New   PT LONG TERM GOAL #5   Title Pt will verbalize/demonstrate understanding of techniques to reduce freezing episodes with gait and turns.   Time 4   Period Weeks   Status New               Plan - 01/07/16 1512    Clinical Impression Statement Pt appears to be performing HEP better than when issued; however, he continues to need cues for slow, pacing of  exercises.  Pt has difficult times with sequencing and coordination of upper and lower body movements, with increased dyskinesias noted.  Pt will continue to benefit from further skilled PT to address balance, gait, posture.   Rehab Potential Good   PT Frequency 2x / week   PT Duration 4 weeks  plus eval   PT Treatment/Interventions ADLs/Self Care Home Management;Balance training;Neuromuscular re-education;Patient/family education;Gait training;Functional mobility training;Therapeutic activities;Therapeutic  exercise   PT Next Visit Plan Review updates to HEP.  Practice balance strategies-hip, ankle, stepping in varied directions.  Establish walking program.   Consulted and Agree with Plan of Care Patient      Patient will benefit from skilled therapeutic intervention in order to improve the following deficits and impairments:  Abnormal gait, Decreased balance, Decreased mobility, Decreased safety awareness, Difficulty walking, Decreased strength, Postural dysfunction  Visit Diagnosis: Other abnormalities of gait and mobility  Abnormal posture     Problem List Patient Active Problem List   Diagnosis Date Noted  . Superficial injury of left leg 12/04/2015  . Advanced directives, counseling/discussion 04/20/2014  . Routine general medical examination at a health care facility 03/16/2012  . 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 Osteoarthrosis, hip 08/12/2011  . Parkinson disease ([place]) 01/29/2011  . Aortic sclerosis ([place]) 01/01/2009  . GILBERT'S SYNDROME 12/09/2007  . GERD 12/09/2007  . RENAL CALCULUS, HX OF 12/09/2007    Cayman Kielbasa W. 01/07/2016, 3:15 PM  [facility] PT [facility] [address] [place], [place], [address] Phone: [phone]   Fax:  [phone]  Name: GRESHAM MARIC MRN: [id number] Date of Birth: Oct 10, 1940

## 2016-01-07 NOTE — Patient Instructions (Signed)
Provided patient with handouts for   -Step and reach forward (with UE support at counter or chair) x 10 reps -Step and reach backward (with UE support at counter) x 10 reps  To be performed 1-2 times per day

## 2016-01-08 ENCOUNTER — Other Ambulatory Visit: Payer: Self-pay | Admitting: Neurology

## 2016-01-08 NOTE — Telephone Encounter (Signed)
Amantadine refill requested. Per last office note- patient to remain on medication. Refill approved and sent to patient's pharmacy.   

## 2016-01-09 ENCOUNTER — Ambulatory Visit: Payer: Medicare Other | Admitting: Physical Therapy

## 2016-01-09 DIAGNOSIS — R29818 Other symptoms and signs involving the nervous system: Secondary | ICD-10-CM

## 2016-01-09 DIAGNOSIS — R2689 Other abnormalities of gait and mobility: Secondary | ICD-10-CM | POA: Diagnosis not present

## 2016-01-09 DIAGNOSIS — R293 Abnormal posture: Secondary | ICD-10-CM | POA: Diagnosis not present

## 2016-01-09 DIAGNOSIS — R2681 Unsteadiness on feet: Secondary | ICD-10-CM | POA: Diagnosis not present

## 2016-01-09 NOTE — Therapy (Signed)
[facility] [address] [place], [place], [address] Phone: [phone]   Fax:  [phone]  Physical Therapy Treatment  Patient Details  Name: Casey Tucker MRN: [id number] Date of Birth: June 26, 1941 Referring Provider: Tat  Encounter Date: 01/09/2016      PT End of Session - 01/09/16 1243    Visit Number 5   Number of Visits 9   Date for PT Re-Evaluation 02/16/16   Authorization Type Medicare GCODE every 10th visit   PT Start Time 1103   PT Stop Time 1147   PT Time Calculation (min) 44 min   Equipment Utilized During Treatment Gait belt   Activity Tolerance Patient tolerated treatment well   Behavior During Therapy [facility] for tasks assessed/performed      Past Medical History  Diagnosis Date  . GERD (gastroesophageal reflux disease) Pre 2002    Mild  . Ruptured appendix teens  . History of kidney stones   . 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 Parkinson disease ([place]) 2004    Slowly progressive  . Arthritis     left hip     Past Surgical History  Procedure Laterality Date  . Neuro evaluation  12/19/2004    [name], benign tremor  . Doppler echocardiography  01/10/2009    LV NML Mild LVH EF 60-65% aortic sclerosis w/0 stenosis   . Appendectomy    . Joint replacement      left hip replacement 08/14/11/[facility]   . Total hip revision  08/27/2011    Procedure: TOTAL HIP REVISION;  Surgeon: Mauri Pole, MD;  [facility];  Service: Orthopedics;  Laterality: Left;  . Deep brain stimulator placement  8/14    L STN    There were no vitals filed for this visit.      Subjective Assessment - 01/09/16 1022    Subjective "Its helping."  Denies falls or changes.   Pertinent History PD x 11 years per pt report.  Deep brain stimulator (L brain) placed 2014; "difficulty with 'blending' with stress or on/off times of  medications"   Patient Stated Goals Pt's goal for therapy is to help with or prevent with freezing episodes   Currently in Pain? No/denies      Forward step weight shifts x 20 at counter with 1 UE support.  Backward step weight shifts x 20 at counter with 1 UE support. Repeated again alternating LE's but pt had significant difficulty with coordination and staying on task as well as keeping alternating pattern. Standing taps to 6" step alternating LE's then 6", 12", 6" and floor.  Pt again unable to keep sequence and needing max cues to sequence and keep movements slow and big.  Pt expressing concerns over difficulty getting up in the middle of the night and ambulating to bathroom.  Pt uses 3 wheeled Rollator at home at night.  Discussed staying appropriate distance to Rollator, maintaining bil heel strike, taking large steps and slowing down pace.  Discussed option of using tape on floor and counting out steps from bed to bathroom.       [facility]) - 01/09/16 1240    PWR! exercises Moves in sitting   PWR! Up x 20   PWR! Rock [employer]! Twist x20   PWR! Step x20   Comments In sitting-moderate to maximum verbal and visual cues for deliberate large movements and decreasing  speed             PT Education - 01/09/16 1242    Education provided Yes   Education Details Ways to reduce freezing and increase safety with 3 wheel Rollator   Person(s) Educated Patient   Methods Explanation   Comprehension Verbalized understanding             PT Long Term Goals - 12/19/15 0853    PT LONG TERM GOAL #1   Title Pt will be independent with HEP for improved balance, transfers, gait.  TARGET 01/17/16   Time 4   Period Weeks   Status New   PT LONG TERM GOAL #2   Title Pt will perform at least 8 of 10 reps of sit<>stand transfers from 18" surfaces or below, no UE support, with no posterior lean for improved efficiency and safety with transfers.   Time 4   Period Weeks   Status New   PT  LONG TERM GOAL #3   Title Pt will perform TUG cognitive score within 10% time of TUG score, to demonstrate improved dual tasking with gait.   Time 4   Period Weeks   PT LONG TERM GOAL #4   Title Pt will improve Functional Gait Assessment to at least 19/30 for decreased fall risk.   Time 4   Period Weeks   Status New   PT LONG TERM GOAL #5   Title Pt will verbalize/demonstrate understanding of techniques to reduce freezing episodes with gait and turns.   Time 4   Period Weeks   Status New               Plan - 01/09/16 1244    Clinical Impression Statement Continues with difficulty sequencing and coordinating during PWR! moves and standing weight shift steps.  Needs visual and verbal cues to decrease pace.  Continue PT per POC.   Rehab Potential Good   PT Frequency 2x / week   PT Duration 4 weeks  plus eval   PT Treatment/Interventions ADLs/Self Care Home Management;Balance training;Neuromuscular re-education;Patient/family education;Gait training;Functional mobility training;Therapeutic activities;Therapeutic exercise   PT Next Visit Plan Practice balance strategies-hip, ankle, stepping in varied directions.  Establish walking program.   Consulted and Agree with Plan of Care Patient      Patient will benefit from skilled therapeutic intervention in order to improve the following deficits and impairments:  Abnormal gait, Decreased balance, Decreased mobility, Decreased safety awareness, Difficulty walking, Decreased strength, Postural dysfunction  Visit Diagnosis: Other abnormalities of gait and mobility  Other symptoms and signs involving the nervous system     Problem List Patient Active Problem List   Diagnosis Date Noted  . Superficial injury of left leg 12/04/2015  . Advanced directives, counseling/discussion 04/20/2014  . Routine general medical examination at a health care facility 03/16/2012  . 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 Osteoarthrosis, hip  08/12/2011  . Parkinson disease ([place]) 01/29/2011  . Aortic sclerosis ([place]) 01/01/2009  . GILBERT'S SYNDROME 12/09/2007  . GERD 12/09/2007  . RENAL CALCULUS, HX OF 12/09/2007    Narda Bonds 01/09/2016, 12:48 PM  [facility] [address] [place], [place], [address] Phone: [phone]   Fax:  [phone]  Name: Casey Tucker MRN: [id number] Date of Birth: [birth year]   Narda Bonds, [place] 01/09/2016 12:48 PM Phone: [phone] Fax: [phone]

## 2016-01-13 DIAGNOSIS — H25013 Cortical age-related cataract, bilateral: Secondary | ICD-10-CM | POA: Diagnosis not present

## 2016-01-13 DIAGNOSIS — H2511 Age-related nuclear cataract, right eye: Secondary | ICD-10-CM | POA: Diagnosis not present

## 2016-01-13 DIAGNOSIS — H2513 Age-related nuclear cataract, bilateral: Secondary | ICD-10-CM | POA: Diagnosis not present

## 2016-01-14 ENCOUNTER — Ambulatory Visit: Payer: Medicare Other | Admitting: Physical Therapy

## 2016-01-14 ENCOUNTER — Telehealth: Payer: Self-pay | Admitting: Physical Therapy

## 2016-01-14 ENCOUNTER — Other Ambulatory Visit: Payer: Self-pay | Admitting: Neurology

## 2016-01-14 ENCOUNTER — Telehealth: Payer: Self-pay | Admitting: Neurology

## 2016-01-14 DIAGNOSIS — R29818 Other symptoms and signs involving the nervous system: Secondary | ICD-10-CM

## 2016-01-14 DIAGNOSIS — R2689 Other abnormalities of gait and mobility: Secondary | ICD-10-CM | POA: Diagnosis not present

## 2016-01-14 DIAGNOSIS — R293 Abnormal posture: Secondary | ICD-10-CM | POA: Diagnosis not present

## 2016-01-14 DIAGNOSIS — R2681 Unsteadiness on feet: Secondary | ICD-10-CM | POA: Diagnosis not present

## 2016-01-14 NOTE — Telephone Encounter (Signed)
Patient called and states he uses Carbidopa Levodopa 50/200 CR occasionally when needed (like vacations, etc) he states he filled this last in 04/2015. Okay to fill this?

## 2016-01-14 NOTE — Telephone Encounter (Signed)
Dr. Carles Collet, I have been working with Casey Tucker, and he would benefit from a U-step rolling walker to assist with safety with gait during off-times of meds, especially at night when going to the bathroom.  We trialed a U-step RW today, and he demonstrated improved gait control, improved step length and slowed pace with gait.  He feels this would help him during the night-time when he experiences more significant freezing of gait.  If you agree, could you please write an order for U-Step Rolling walker?  Thanks so much, Mady Haagensen, PT

## 2016-01-14 NOTE — Telephone Encounter (Signed)
Patient's wife made aware.

## 2016-01-14 NOTE — Telephone Encounter (Signed)
He can keep it on.  No leaning directly on it or use of cautery but otherwise should be okay

## 2016-01-14 NOTE — Telephone Encounter (Signed)
I would imagine he could keep on if not using cautery- but please advise.

## 2016-01-14 NOTE — Telephone Encounter (Signed)
Casey Tucker [birth year]. He will be having Cataract surgery soon and needs to know about turning off his DVS? His number is [phone] home and [phone] cell. He said you could leave a Voice mail. Thank you

## 2016-01-14 NOTE — Telephone Encounter (Signed)
Is he using at hs prn?.  Remind him that I really need to know what he takes/when and we want to stay consistent with meds.

## 2016-01-14 NOTE — Therapy (Signed)
[facility] [address] [place], [place], [address] Phone: [phone]   Fax:  [phone]  Physical Therapy Treatment  Patient Details  Name: Casey Tucker MRN: [id number] Date of Birth: 05/10/1941 Referring Provider: Tat  Encounter Date: 01/14/2016      PT End of Session - 01/14/16 1214    Visit Number 6   Number of Visits 9   Date for PT Re-Evaluation 02/16/16   Authorization Type Medicare GCODE every 10th visit   PT Start Time 1102   PT Stop Time 1146   PT Time Calculation (min) 44 min   Equipment Utilized During Treatment Gait belt   Activity Tolerance Patient tolerated treatment well   Behavior During Therapy [facility] for tasks assessed/performed      Past Medical History  Diagnosis Date  . GERD (gastroesophageal reflux disease) Pre 2002    Mild  . Ruptured appendix teens  . History of kidney stones   . 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 Parkinson disease ([place]) 2004    Slowly progressive  . Arthritis     left hip     Past Surgical History  Procedure Laterality Date  . Neuro evaluation  12/19/2004    [name], benign tremor  . Doppler echocardiography  01/10/2009    LV NML Mild LVH EF 60-65% aortic sclerosis w/0 stenosis   . Appendectomy    . Joint replacement      left hip replacement 08/14/11/[place]   . Total hip revision  08/27/2011    Procedure: TOTAL HIP REVISION;  Surgeon: Mauri Pole, MD;  Location: [facility];  Service: Orthopedics;  Laterality: Left;  . Deep brain stimulator placement  8/14    L STN    There were no vitals filed for this visit.      Subjective Assessment - 01/14/16 1103    Subjective Several tihings have helped alot-the cues for turns, the cues for BIG steps    Pertinent History PD x 11 years per pt report.  Deep brain stimulator (L brain) placed 2014; "difficulty with 'blending'  with stress or on/off times of medications"   Patient Stated Goals Pt's goal for therapy is to help with or prevent with freezing episodes   Currently in Pain? No/denies                         [facility] Adult PT Treatment/Exercise - 01/14/16 0001    Ambulation/Gait   Ambulation/Gait Yes   Ambulation/Gait Assistance 5: Supervision   Ambulation/Gait Assistance Details Gait trials with U-step rolling walker in attempt to problem-solve gait difficulties at night time to bathroom with 3-wheeled RW.  Pt reports he pushes 3-wheeled RW too far out in front and has difficulty progressing feet, resulting in at least one recent fall.  Gait using laser light on U-step RW with improved overall step length, improved posture (pt staying close to walker BOS).  Pt does need occasional cues to slow pace of gait.     Ambulation Distance (Feet) 200 Feet  x 3 reps, then varied distances in gym with furniture   Assistive device Other (Comment)  U-Step Rolling walker   Gait Pattern Step-through pattern;Decreased step length - right;Decreased step length - left  hastening of steps at times; improved step length w/U-Step   Ambulation Surface Level;Indoor   Gait Comments Gait with no device with metronome  at 80-84 bpm, with pt able to initially keep pace for first 31ft, then pt takes faster steps than beat.  Use of metronome ( vs. laser light) on U-step RW with beat at 76-80 bpm, with initial ability to keep pace of metronome, then pt. hastens gait pattern.  Needs cues to stop and reset pace of gait.  Pt overall seems pleased with U-step as option for gait to bathroom at night, due to pt experiencing freezing episodes and off times during that time of night.  During therapy, U-step RW with use of laser light assists pt to slow gait pattern, stay closer to base of walker and take longer steps than when not using assistive device.    Neuro Re-ed    Neuro Re-ed Details  Ankle/hip strategy work at parallel bars,  x 20 reps heel/toe raises, x 20 reps hip strategy in anterior/posterior direction, with cues for increased control of exercises.  Step strategy work for balance with progression of difficulty:  forward step and weight shift x 10 reps alternating legs, back step and weightshift x 10 reps each leg, then side step and weightshift x 10 reps alternating legs.  Progressed away from parallel bars to intermitten>no UE support of chair:  varied direction step and weightshift, then balance perturbations with stepping strategy in varied directions, with cues for deliberate, focused attention on step and return to upright standing for balance recovery.                PT Education - 01/14/16 1213    Education provided Yes   Education Details Discussed deliberate attention to stepping exercises for balance recovery strategy; U-Step rolling walker as solution for improved gait during off-times at night.   Person(s) Educated Patient   Methods Explanation;Demonstration;Handout   Comprehension Verbalized understanding             PT Long Term Goals - 12/19/15 0853    PT LONG TERM GOAL #1   Title Pt will be independent with HEP for improved balance, transfers, gait.  TARGET 01/17/16   Time 4   Period Weeks   Status New   PT LONG TERM GOAL #2   Title Pt will perform at least 8 of 10 reps of sit<>stand transfers from 18" surfaces or below, no UE support, with no posterior lean for improved efficiency and safety with transfers.   Time 4   Period Weeks   Status New   PT LONG TERM GOAL #3   Title Pt will perform TUG cognitive score within 10% time of TUG score, to demonstrate improved dual tasking with gait.   Time 4   Period Weeks   PT LONG TERM GOAL #4   Title Pt will improve Functional Gait Assessment to at least 19/30 for decreased fall risk.   Time 4   Period Weeks   Status New   PT LONG TERM GOAL #5   Title Pt will verbalize/demonstrate understanding of techniques to reduce freezing  episodes with gait and turns.   Time 4   Period Weeks   Status New               Plan - 01/14/16 1215    Clinical Impression Statement Pt responds well to cues for stepping strategies for focused, deliberate motions and does not experience any LOB with perturbations in varied directions.  Focused time in today's session problem-solving strategy for improved gait and decreased fall risk during off-times of gait (pt currently uses 3-wheeled RW and experiences  freezing of gait and has had at least one fall.  With trial of U-step RW today with laser light, pt noted to have improved gait control, slowed gait pace, improved step length, and  improved ability to stay within BOS of walker.  Pt feels U-Step RW would be viable option for night-time and off-times with gait and is interested in pursuing.     Rehab Potential Good   PT Frequency 2x / week   PT Duration 4 weeks  plus eval   PT Treatment/Interventions ADLs/Self Care Home Management;Balance training;Neuromuscular re-education;Patient/family education;Gait training;Functional mobility training;Therapeutic activities;Therapeutic exercise   PT Next Visit Plan Continue to practice balance strategies-hip, ankle, stepping in varied directions with focus on deliberate movements; gait with carrying objects; f/u on U-step RW.  Establish walking program.   Consulted and Agree with Plan of Care Patient      Patient will benefit from skilled therapeutic intervention in order to improve the following deficits and impairments:  Abnormal gait, Decreased balance, Decreased mobility, Decreased safety awareness, Difficulty walking, Decreased strength, Postural dysfunction  Visit Diagnosis: Other abnormalities of gait and mobility  Other symptoms and signs involving the nervous system     Problem List Patient Active Problem List   Diagnosis Date Noted  . Superficial injury of left leg 12/04/2015  . Advanced directives, counseling/discussion  04/20/2014  . Routine general medical examination at a health care facility 03/16/2012  . 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 Osteoarthrosis, hip 08/12/2011  . Parkinson disease ([place]) 01/29/2011  . Aortic sclerosis ([place]) 01/01/2009  . GILBERT'S SYNDROME 12/09/2007  . GERD 12/09/2007  . RENAL CALCULUS, HX OF 12/09/2007    Frazier Butt. 01/14/2016, 12:24 PM [facility] [address] [place], [place], [address] Phone: [phone]   Fax:  [phone]  Name: Casey Tucker MRN: [id number] Date of Birth: 19-Mar-1941

## 2016-01-15 ENCOUNTER — Ambulatory Visit: Payer: Medicare Other | Admitting: Physical Therapy

## 2016-01-15 ENCOUNTER — Telehealth: Payer: Self-pay | Admitting: Neurology

## 2016-01-15 DIAGNOSIS — G20 Parkinson's disease: Secondary | ICD-10-CM

## 2016-01-15 DIAGNOSIS — R29818 Other symptoms and signs involving the nervous system: Secondary | ICD-10-CM | POA: Diagnosis not present

## 2016-01-15 DIAGNOSIS — R2681 Unsteadiness on feet: Secondary | ICD-10-CM

## 2016-01-15 DIAGNOSIS — R2689 Other abnormalities of gait and mobility: Secondary | ICD-10-CM | POA: Diagnosis not present

## 2016-01-15 DIAGNOSIS — R293 Abnormal posture: Secondary | ICD-10-CM | POA: Diagnosis not present

## 2016-01-15 MED ORDER — CARBIDOPA-LEVODOPA ER 50-200 MG PO TBCR: 1.0000 | EXTENDED_RELEASE_TABLET | ORAL | Status: DC | PRN

## 2016-01-15 NOTE — Telephone Encounter (Signed)
No he states he will take it during the morning to help get going.

## 2016-01-15 NOTE — Telephone Encounter (Signed)
-----   Message from Frazier Butt, PT sent at 01/15/2016  7:44 AM EDT ----- Regarding: Request for order for U-step RW Hi, I tried to send a telephone message yesterday-not sure if I did it correctly.  Wanted to make sure you got this request.  We tried a U-step RW for T. Wogan yesterday and it worked well; may be helpful on off-times for freezing.  If you agree, could you please send order via EPIC for U-step rolling walker?  Thanks, Mady Haagensen, PT

## 2016-01-15 NOTE — Telephone Encounter (Signed)
RX sent.  Patient aware.  

## 2016-01-15 NOTE — Telephone Encounter (Signed)
Order submitted via EPIC

## 2016-01-15 NOTE — Therapy (Signed)
[facility] [address] [place], [place], [address] Phone: [phone]   Fax:  [phone]  Physical Therapy Treatment  Patient Details  Name: Casey Tucker MRN: [id number] Date of Birth: 1941/06/02 Referring Provider: Tat  Encounter Date: 01/15/2016      PT End of Session - 01/15/16 2156    Visit Number 7   Number of Visits 9   Date for PT Re-Evaluation 02/16/16   Authorization Type Medicare GCODE every 10th visit   PT Start Time 0805   PT Stop Time 0847   PT Time Calculation (min) 42 min   Equipment Utilized During Treatment Gait belt   Activity Tolerance Patient tolerated treatment well   Behavior During Therapy [facility] for tasks assessed/performed      Past Medical History  Diagnosis Date  . GERD (gastroesophageal reflux disease) Pre 2002    Mild  . Ruptured appendix teens  . History of kidney stones   . 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 Parkinson disease ([place]) 2004    Slowly progressive  . Arthritis     left hip     Past Surgical History  Procedure Laterality Date  . Neuro evaluation  12/19/2004    [name], benign tremor  . Doppler echocardiography  01/10/2009    LV NML Mild LVH EF 60-65% aortic sclerosis w/0 stenosis   . Appendectomy    . Joint replacement      left hip replacement 08/14/11/[place]   . Total hip revision  08/27/2011    Procedure: TOTAL HIP REVISION;  Surgeon: Mauri Pole, MD;  Location: [facility];  Service: Orthopedics;  Laterality: Left;  . Deep brain stimulator placement  8/14    L STN    There were no vitals filed for this visit.      Subjective Assessment - 01/15/16 0807    Subjective Would like to go the cheapest route for the U-step RW.   Pertinent History PD x 11 years per pt report.  Deep brain stimulator (L brain) placed 2014; "difficulty with 'blending' with stress or on/off  times of medications"   Patient Stated Goals Pt's goal for therapy is to help with or prevent with freezing episodes   Currently in Pain? No/denies                         [facility] Adult PT Treatment/Exercise - 01/15/16 0814    Ambulation/Gait   Ambulation/Gait Yes   Ambulation/Gait Assistance 5: Supervision   Ambulation/Gait Assistance Details Gait training with U-Step RW, with negotiating around furniture, turns, tight spaces, with occasional cues for stopping and staying close to walker.  Pt does not experience any freezing episodes with U-Step RW.   Ambulation Distance (Feet) 120 Feet  then 400 ft with U-step RW   Assistive device Other (Comment)  3-wheeled RW from home; U-step RW   Gait Pattern Step-through pattern;Decreased step length - right;Decreased step length - left  No freezing of gait noted with U-step RW   Ambulation Surface Level;Indoor   Gait Comments Short distance gait with no device in gym area with supervision.  Addressed short distance gait with carrying objects:  lightweight basket, and 6" block, 25 ft x 2 each object, with cues for use of visual targets and cues for counting if needed to avoid freezing.  Pt does not experience freezing with this activity  with these cues.   Self-Care   Self-Care Other Self-Care Comments   Other Self-Care Comments  Reviewed tips for reducing freezing of gait, initiating gait and gait with turns and pt feels strategies we have practiced have helped.   Neuro Re-ed    Neuro Re-ed Details   Step strategy work for balance with progression of difficulty:  forward step and weight shift x 10 reps alternating legs, back step and weightshift x 10 reps each leg, then side step and weightshift x 10 reps alternating legs.  Progressed away from counter to intermittent>no UE support of chair:  varied direction step and weightshift, then balance perturbations with stepping strategy in varied directions, with cues for deliberate, focused  attention on step and return to upright standing for balance recovery.          Self Care Continued:  Discussed options for ordering U-step RW (self-pay through [employer] versus insurance billed through [employer]).  Pt opts for insurance to be billed.  PT to follow up to obtain MD order and send paperwork.  Pt in agreement.  Pt brings in his 3-wheeled RW today to therapy.  Upon coming through doorway into gym area, pt has episode of pushing walker too far in front and has freezing episode, requiring therapist cues to stop and reset to bring walker closer to him.      PT Education - 01/15/16 2154    Education provided Yes   Education Details Discussed ways to obtain U-step RW; pt in agreement with proceeding with going through [employer] with MD order   Person(s) Educated Patient   Methods Explanation;Demonstration   Comprehension Verbalized understanding             PT Long Term Goals - 12/19/15 0853    PT LONG TERM GOAL #1   Title Pt will be independent with HEP for improved balance, transfers, gait.  TARGET 01/17/16   Time 4   Period Weeks   Status New   PT LONG TERM GOAL #2   Title Pt will perform at least 8 of 10 reps of sit<>stand transfers from 18" surfaces or below, no UE support, with no posterior lean for improved efficiency and safety with transfers.   Time 4   Period Weeks   Status New   PT LONG TERM GOAL #3   Title Pt will perform TUG cognitive score within 10% time of TUG score, to demonstrate improved dual tasking with gait.   Time 4   Period Weeks   PT LONG TERM GOAL #4   Title Pt will improve Functional Gait Assessment to at least 19/30 for decreased fall risk.   Time 4   Period Weeks   Status New   PT LONG TERM GOAL #5   Title Pt will verbalize/demonstrate understanding of techniques to reduce freezing episodes with gait and turns.   Time 4   Period Weeks   Status New               Plan - 01/15/16 2157    Clinical  Impression Statement Pt continues to use U-step rolling walker without episodes of freezing, with increased step length and improved gait control.  Pt in agreement to pursue U-Step rolling walker through [employer].  Pt will continue to benefit from further skilled physical therapy to address functional activities, balance and gait.  Pt feels that addressing specific functional  tasks has been very helpful for him.   Rehab Potential Good   PT Frequency  2x / week   PT Duration 4 weeks  plus eval   PT Treatment/Interventions ADLs/Self Care Home Management;Balance training;Neuromuscular re-education;Patient/family education;Gait training;Functional mobility training;Therapeutic activities;Therapeutic exercise   PT Next Visit Plan Establish walking program.  requests to work on carrying large objects during gait (coordinated with another person, as he builds furniture), and quick starts/stops with gait.   Consulted and Agree with Plan of Care Patient      Patient will benefit from skilled therapeutic intervention in order to improve the following deficits and impairments:  Abnormal gait, Decreased balance, Decreased mobility, Decreased safety awareness, Difficulty walking, Decreased strength, Postural dysfunction  Visit Diagnosis: Other abnormalities of gait and mobility  Unsteadiness on feet     Problem List Patient Active Problem List   Diagnosis Date Noted  . Superficial injury of left leg 12/04/2015  . Advanced directives, counseling/discussion 04/20/2014  . Routine general medical examination at a health care facility 03/16/2012  . 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 Osteoarthrosis, hip 08/12/2011  . Parkinson disease ([place]) 01/29/2011  . Aortic sclerosis ([place]) 01/01/2009  . GILBERT'S SYNDROME 12/09/2007  . GERD 12/09/2007  . RENAL CALCULUS, HX OF 12/09/2007    Ayodeji Keimig W. 01/15/2016, 10:03 PM  Frazier Butt., PT  [facility] [address] [place], [place], [address] Phone: [phone]   Fax:  [phone]  Name: MATTIA ROCCA MRN: [id number] Date of Birth: 1940/09/05

## 2016-01-15 NOTE — Telephone Encounter (Signed)
That is a little bit odd, but ok

## 2016-01-16 ENCOUNTER — Ambulatory Visit: Payer: Medicare Other | Admitting: Physical Therapy

## 2016-01-23 ENCOUNTER — Ambulatory Visit: Payer: Medicare Other | Admitting: Neurology

## 2016-01-23 ENCOUNTER — Ambulatory Visit: Payer: Medicare Other | Admitting: Physical Therapy

## 2016-01-24 ENCOUNTER — Other Ambulatory Visit: Payer: Self-pay

## 2016-01-24 MED ORDER — CLONAZEPAM 0.5 MG PO TABS: 0.5000 mg | ORAL_TABLET | Freq: Every evening | ORAL | Status: DC | PRN

## 2016-01-24 NOTE — Telephone Encounter (Signed)
Last filled 12-02-15 #60 Last OV 12-04-15 Next OV 05-05-16

## 2016-01-24 NOTE — Telephone Encounter (Signed)
Verbal refill given to Toni at the pharmacy 

## 2016-01-24 NOTE — Telephone Encounter (Signed)
Approved: #60 x 0 

## 2016-01-28 ENCOUNTER — Ambulatory Visit: Payer: Medicare Other | Attending: Neurology | Admitting: Physical Therapy

## 2016-01-28 DIAGNOSIS — R29818 Other symptoms and signs involving the nervous system: Secondary | ICD-10-CM | POA: Diagnosis not present

## 2016-01-28 DIAGNOSIS — R2681 Unsteadiness on feet: Secondary | ICD-10-CM

## 2016-01-28 DIAGNOSIS — R2689 Other abnormalities of gait and mobility: Secondary | ICD-10-CM

## 2016-01-29 ENCOUNTER — Ambulatory Visit: Payer: Medicare Other | Admitting: Physical Therapy

## 2016-01-29 NOTE — Therapy (Signed)
[facility] [address] [place], [place], [address] Phone: [phone]   Fax:  [phone]  Physical Therapy Treatment  Patient Details  Name: Casey Tucker MRN: [id number] Date of Birth: 1941/01/06 Referring Provider: Tat  Encounter Date: 01/28/2016      PT End of Session - 01/29/16 1749    Visit Number 8   Number of Visits 9   Date for PT Re-Evaluation 02/16/16   Authorization Type Medicare GCODE every 10th visit   PT Start Time 1104   PT Stop Time 1146   PT Time Calculation (min) 42 min   Equipment Utilized During Treatment Gait belt   Activity Tolerance Patient tolerated treatment well  Needs frequent redirection to task   Behavior During Therapy [facility] for tasks assessed/performed      Past Medical History  Diagnosis Date  . GERD (gastroesophageal reflux disease) Pre 2002    Mild  . Ruptured appendix teens  . History of kidney stones   . 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 Parkinson disease ([place]) 2004    Slowly progressive  . Arthritis     left hip     Past Surgical History  Procedure Laterality Date  . Neuro evaluation  12/19/2004    [name], benign tremor  . Doppler echocardiography  01/10/2009    LV NML Mild LVH EF 60-65% aortic sclerosis w/0 stenosis   . Appendectomy    . Joint replacement      left hip replacement 08/14/11/[place]   . Total hip revision  08/27/2011    Procedure: TOTAL HIP REVISION;  Surgeon: Mauri Pole, MD;  Location: [facility];  Service: Orthopedics;  Laterality: Left;  . Deep brain stimulator placement  8/14    L STN    There were no vitals filed for this visit.      Subjective Assessment - 01/28/16 1105    Subjective Had a near fall in a store, but was able to get up.  Having a hard day so far today, as I didn't sleep well last night.   Pertinent History PD x 11 years per pt  report.  Deep brain stimulator (L brain) placed 2014; "difficulty with 'blending' with stress or on/off times of medications"   Patient Stated Goals Pt's goal for therapy is to help with or prevent with freezing episodes   Currently in Pain? No/denies            [facility] PT Assessment - 01/29/16 1744    Timed Up and Go Test   Normal TUG (seconds) 12.74   Cognitive TUG (seconds) 13.98   Functional Gait  Assessment   Gait assessed  Yes   Gait Level Surface Walks 20 ft, slow speed, abnormal gait pattern, evidence for imbalance or deviates 10-15 in outside of the 12 in walkway width. Requires more than 7 sec to ambulate 20 ft.  7.18   Change in Gait Speed Makes only minor adjustments to walking speed, or accomplishes a change in speed with significant gait deviations, deviates 10-15 in outside the 12 in walkway width, or changes speed but loses balance but is able to recover and continue walking.   Gait with Horizontal Head Turns Performs head turns smoothly with slight change in gait velocity (eg, minor disruption to smooth gait path), deviates 6-10 in outside 12 in walkway width, or uses an assistive device.   Gait with Vertical  Head Turns Performs task with slight change in gait velocity (eg, minor disruption to smooth gait path), deviates 6 - 10 in outside 12 in walkway width or uses assistive device   Gait and Pivot Turn Pivot turns safely in greater than 3 sec and stops with no loss of balance, or pivot turns safely within 3 sec and stops with mild imbalance, requires small steps to catch balance.   Step Over Obstacle Is able to step over one shoe box (4.5 in total height) without changing gait speed. No evidence of imbalance.   Gait with Narrow Base of Support Ambulates less than 4 steps heel to toe or cannot perform without assistance.   Gait with Eyes Closed Walks 20 ft, uses assistive device, slower speed, mild gait deviations, deviates 6-10 in outside 12 in walkway width. Ambulates 20 ft  in less than 9 sec but greater than 7 sec.  7.93 sec   Ambulating Backwards Walks 20 ft, slow speed, abnormal gait pattern, evidence for imbalance, deviates 10-15 in outside 12 in walkway width.  10.93 sec incr. UE dyskinesia, shuffling steps   Steps Two feet to a stair, must use rail.   Total Score 14   FGA comment: Scores decreased by 1 point since eval                     [facility] Adult PT Treatment/Exercise - 01/29/16 0001    Transfers   Transfers Sit to Stand;Stand to Sit   Sit to Stand 5: Supervision;Without upper extremity assist;From chair/3-in-1   Stand to Sit 5: Supervision;Without upper extremity assist;To chair/3-in-1   Number of Reps 10 reps   Comments Pt has slight posterior lean and requires cues for increased forward lean and to slow pace of transfers.   Ambulation/Gait   Ambulation/Gait Yes   Ambulation/Gait Assistance 5: Supervision   Ambulation/Gait Assistance Details Gait training with trial of U-step RW x 600 ft, including narrow spaces, furniture and turns.  Pt experiences several episodes of freezing, needing cues to stop and reset posture.  Pt uses laser light to increase step length.   Ambulation Distance (Feet) 120 Feet  x 2, then 600 ft with U-step   Assistive device Other (Comment)  U-Step RW   Gait Pattern Step-through pattern;Decreased step length - right;Decreased step length - left   Ambulation Surface Level;Indoor   Gait Comments Pt brings in and initially uses 3-wheeled RW today, with pt keeping feet and BOS too far behind 3-wheeled RW. Pt experiences freezing upon initiating gait in lobby and with negotiating threshold in lobby area.         Neuro Re-education: Reviewed sitting and standing PWR! Moves exercises as provided in HEP, 5 reps each exercise.  Pt needs reminders and cues for slowed pace and deliberate movement patterns.  Reminded pt to continue to perform this HEP even after D/C from PT.  Self Care: -Provided update on ordering  of U-step RW (paperwork has been faxed to [employer] company on 01/27/16, and company will likely contact patient as next step). -Discussed walking program for home/outdoor walking several times per day, especially with U-step RW for improved safety and endurance with gait. -Discussed progress towards goals and plans for discharge -Discussed options for return to PT (PT screen, return eval, pt request MD for return to therapy if functional changes noted).  Pt prefers not to schedule future screen or eval, but to speak with Dr. Carles Collet at her next visit and decide about return to  therapy at that point.          PT Education - 01/29/16 1748    Education provided Yes   Education Details Progress towards goals; update on ordering of U-step RW; plans for disharge this visit; walking program for home   Person(s) Educated Patient   Methods Explanation   Comprehension Verbalized understanding             PT Long Term Goals - 2016/02/06 1109    PT LONG TERM GOAL #1   Title Pt will be independent with HEP for improved balance, transfers, gait.  TARGET 01/17/16   Time 4   Period Weeks   Status Partially Met   PT LONG TERM GOAL #2   Title Pt will perform at least 8 of 10 reps of sit<>stand transfers from 18" surfaces or below, no UE support, with no posterior lean for improved efficiency and safety with transfers.   Time 4   Period Weeks   Status Partially Met   PT LONG TERM GOAL #3   Title Pt will perform TUG cognitive score within 10% time of TUG score, to demonstrate improved dual tasking with gait.   Time 4   Period Weeks   Status Achieved   PT LONG TERM GOAL #4   Title Pt will improve Functional Gait Assessment to at least 19/30 for decreased fall risk.   Time 4   Period Weeks   Status Not Met   PT LONG TERM GOAL #5   Title Pt will verbalize/demonstrate understanding of techniques to reduce freezing episodes with gait and turns.   Time 4   Period Weeks   Status Achieved                Plan - 01/29/16 1749    Clinical Impression Statement PT has partially met LTG #1 and 2.  LTG #3 and 5 met.  LTG #4 not met.  Pt feels overall he is more aware of his movement patterns and how to decrease the frequency and less the intensity of freezing episodes.  Pt overall pleased with progress with PT and is agreeable to discharge this visit.   Rehab Potential Good   PT Frequency 2x / week   PT Duration 4 weeks  plus eval   PT Treatment/Interventions ADLs/Self Care Home Management;Balance training;Neuromuscular re-education;Patient/family education;Gait training;Functional mobility training;Therapeutic activities;Therapeutic exercise   PT Next Visit Plan Discharge this visit.   Consulted and Agree with Plan of Care Patient      Patient will benefit from skilled therapeutic intervention in order to improve the following deficits and impairments:  Abnormal gait, Decreased balance, Decreased mobility, Decreased safety awareness, Difficulty walking, Decreased strength, Postural dysfunction  Visit Diagnosis: Other abnormalities of gait and mobility  Other symptoms and signs involving the nervous system  Unsteadiness on feet       G-Codes - 02/06/16 1753    Functional Assessment Tool Used TUG 12.74 sec, TUG cognitive 13.98 sec, Functional Gait Assessment 14/30   Functional Limitation Mobility: Walking and moving around   Mobility: Walking and Moving Around Goal Status [phone]) At least 1 percent but less than 20 percent impaired, limited or restricted   Mobility: Walking and Moving Around Discharge Status [phone]) At least 20 percent but less than 40 percent impaired, limited or restricted      Problem List Patient Active Problem List   Diagnosis Date Noted  . Superficial injury of left leg 12/04/2015  . Advanced directives, counseling/discussion 04/20/2014  . Routine  general medical examination at a health care facility 03/16/2012  . S/P revision of left total  hip 08/27/2011  . Osteoarthrosis, hip 08/12/2011  . Parkinson disease ([place]) 01/29/2011  . Aortic sclerosis ([place]) 01/01/2009  . GILBERT'S SYNDROME 12/09/2007  . GERD 12/09/2007  . RENAL CALCULUS, HX OF 12/09/2007    Deirdra Heumann W. 01/29/2016, 5:54 PM  [facility] [address] [place], [place], [address] Phone: [phone]   Fax:  [phone]  Name: Casey Tucker MRN: [id number] Date of Birth: September 18, 1940  PHYSICAL THERAPY DISCHARGE SUMMARY  Visits from Start of Care: 8  Current functional level related to goals / functional outcomes:     PT Long Term Goals - 01/28/16 1109    PT LONG TERM GOAL #1   Title Pt will be independent with HEP for improved balance, transfers, gait.  TARGET 01/17/16   Time 4   Period Weeks   Status Partially Met   PT LONG TERM GOAL #2   Title Pt will perform at least 8 of 10 reps of sit<>stand transfers from 18" surfaces or below, no UE support, with no posterior lean for improved efficiency and safety with transfers.   Time 4   Period Weeks   Status Partially Met   PT LONG TERM GOAL #3   Title Pt will perform TUG cognitive score within 10% time of TUG score, to demonstrate improved dual tasking with gait.   Time 4   Period Weeks   Status Achieved   PT LONG TERM GOAL #4   Title Pt will improve Functional Gait Assessment to at least 19/30 for decreased fall risk.   Time 4   Period Weeks   Status Not Met   PT LONG TERM GOAL #5   Title Pt will verbalize/demonstrate understanding of techniques to reduce freezing episodes with gait and turns.   Time 4   Period Weeks   Status Achieved    Pt's functional scores somewhat unchanged, but patient reports being more aware of movement patterns and utilizing strategies to decrease freezing episodes.   Remaining deficits: Timing and coordination of movement patterns, decreased balance, freezing episodes, dyskinesias.   Education /  Equipment: Educated patient in HEP, strategies for reduction of freezing with gait, benefit of U-step RW  Plan: Patient agrees to discharge.  Patient goals were partially met. Patient is being discharged due to being pleased with the current functional level.  ?????     Mady Haagensen, PT 01/29/2016 6:01 PM Phone: [phone] Fax: [phone]

## 2016-02-24 ENCOUNTER — Other Ambulatory Visit: Payer: Self-pay | Admitting: Neurology

## 2016-02-25 DIAGNOSIS — H2511 Age-related nuclear cataract, right eye: Secondary | ICD-10-CM | POA: Diagnosis not present

## 2016-02-25 DIAGNOSIS — H2512 Age-related nuclear cataract, left eye: Secondary | ICD-10-CM | POA: Diagnosis not present

## 2016-02-26 DIAGNOSIS — H2512 Age-related nuclear cataract, left eye: Secondary | ICD-10-CM | POA: Diagnosis not present

## 2016-03-03 DIAGNOSIS — H2512 Age-related nuclear cataract, left eye: Secondary | ICD-10-CM | POA: Diagnosis not present

## 2016-03-03 DIAGNOSIS — H2511 Age-related nuclear cataract, right eye: Secondary | ICD-10-CM | POA: Diagnosis not present

## 2016-03-10 DIAGNOSIS — H2511 Age-related nuclear cataract, right eye: Secondary | ICD-10-CM | POA: Diagnosis not present

## 2016-03-10 DIAGNOSIS — H2512 Age-related nuclear cataract, left eye: Secondary | ICD-10-CM | POA: Diagnosis not present

## 2016-03-17 ENCOUNTER — Other Ambulatory Visit: Payer: Self-pay

## 2016-03-17 MED ORDER — CLONAZEPAM 0.5 MG PO TABS: 0.5000 mg | ORAL_TABLET | Freq: Every evening | ORAL | 0 refills | Status: DC | PRN

## 2016-03-17 NOTE — Telephone Encounter (Signed)
Left refill on voice mail at pharmacy  

## 2016-03-17 NOTE — Telephone Encounter (Signed)
Approved: #60 x 0 

## 2016-03-17 NOTE — Telephone Encounter (Signed)
Last filled 01-24-16 #60 Last OV Acute 12-04-15 Next OV 05-05-16

## 2016-03-18 ENCOUNTER — Telehealth: Payer: Self-pay | Admitting: Neurology

## 2016-03-18 NOTE — Telephone Encounter (Signed)
Amantadine ER is no different than amantadine regular release except it is extended release.  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  He has been on regular amantadine before

## 2016-03-18 NOTE — Telephone Encounter (Signed)
Patient made aware.

## 2016-03-18 NOTE — Telephone Encounter (Signed)
Pt called to ck on Klonopin refill and was advised called to [facility] on 03/17/16. Pt voiced understanding and will ck with pharmacy.

## 2016-03-18 NOTE — Telephone Encounter (Signed)
Spoke with patient and he states he heard Amantadine extended release was just put on the market at the beginning of august and he would like to try this.  Dr. Carles Collet please advise.   Patient also states he had never heard about his walker RX. I spoke with Langley Gauss at [employer] and they have to work with a special company to get this walker and they are still actively working on it. They will call patient directly to discuss.

## 2016-03-18 NOTE — Telephone Encounter (Signed)
Patient wants to talk to someone about a new medication that has been approved please call [phone]

## 2016-03-19 ENCOUNTER — Telehealth: Payer: Self-pay | Admitting: Physical Therapy

## 2016-03-19 NOTE — Telephone Encounter (Signed)
Phoned patient in response to earlier phone call regarding his report that he has not yet heard back from [employer] regarding U-step RW.  PT let pt know the following:  -PT faxed order and documentation to [employer] 01/27/16 -PT followed up with phone call to [employer] 03/06/16, in which Shanon Brow at that company stated that on 02/13/16, order for U-step RW was approved.  He sent email to his supervisor noting that patient had not yet been contacted, requesting contact to patient. -PT provided number for patient to contact [employer] directly, as PT has been told by that company that once paperwork is provided by therapist, they only need to contact and speak with patient.   Pt in agreement to contact the company.

## 2016-04-08 DIAGNOSIS — Z23 Encounter for immunization: Secondary | ICD-10-CM | POA: Diagnosis not present

## 2016-04-09 ENCOUNTER — Other Ambulatory Visit: Payer: Self-pay | Admitting: Neurology

## 2016-04-09 NOTE — Telephone Encounter (Signed)
PT called regarding a medication refill/Dawn CB# [phone]

## 2016-04-21 NOTE — Progress Notes (Signed)
1`   Casey Tucker was seen today in the movement disorders clinic for neurologic consultation at the request of [name], MD.  The consultation is for the evaluation of PD.  The records that were made available to me were reviewed.  Pt is accompanied by his wife who supplements the history.   Pt reports first sx was in 2006.  Pt is R hand dominant.  Pt states that his next door neighbor (Network engineer) noted that he wasn't swinging his arm and thought that he had a stroke. He subsequently saw [name] in about 2006 and he was dx with PD.  He was started on mirapex and azilect.  It helped the overall stiffness.  Tremor developed in the R arm around the same time as the diagnosis.  Levodopa was started in approximately 2009-2010.  He also started an exercise program and he believes that has made a huge difference.  He began to have some motor fluctuations and saw [name] and had his surgery done in 02/2013.  It was a unilateral implant, placed into the L STN.  He has noted that he is sleeping much better since surgery.  No tremor.  [name] programmed the DBS one week post op.  He f/u with the NP at [facility], and he now has an A and a B setting.  He hates the A setting (doesn't walk well) and he "cranked" his own B setting up to 280 (assuming frequency) and this helped walking some but he still has problems with balance and freezing.  He is on mirapex, 1 mg tid.  He is on carbidopa/levodopa 25/100, 7:30am/11am/2pm/6pm.    11/15/13:  Pt returns today for follow up.  Overall, he has been doing well.  He is off of medication this AM for programming.  His wife reports that he has done better on his new setting.  Seems to be walking better with less dyskinesia.  Until this morning, he was still on the carbidopa/levodopa 25/100, 1 tablet 4 times per day, Azilect 1 mg daily and Mirapex 1 mg 3 times per day.  He is exercising faithfully.  He has not had any falls.  He has been shuffling a little more this  morning, since he is off of medication.  11/21/13 update:  The patient is returning for followup, earlier than expected.  The patient reports that he really had not been doing as well over the weekend.  He did not feel like he was walking as well. He felt like the setting "wore" off 30 mins before the next levodopa dosage.  His wife wonders if he wasn't doing as well because he was at the beach this past weekend and there were more stairs to negotiate.  No falls.  No hallucinations   He did decrease his carbidopa/levodopa 25/100, one tablet 3 times per day from one tablet 4 times per day.  He is still on Mirapex 1 mg 3 times per day and Azilect 1 mg daily.  12/20/13 update:  The patient returns today for followup, accompanied by his wife and daughter who supplements the history.  The patient is currently on Mirapex, 1 mg 3 times a day, Azilect 1 mg daily and carbidopa/levodopa 25/100 at 7:30 AM/11 AM/2 PM and 6:30 PM.  Overall, the patient states that he is doing much better on his current setting.  It is not seeming to "wear off."  He is having some difficulty in the middle of the night when he gets  up and sometimes has to use a cane, but otherwise is doing well.  No falls.  On one occasion, he did try to get up in the morning and go and take a walk and was able to walk a few miles without taking his medication, but then he needed it when he got home.  He has noticed some dyskinesia, but it has not been particularly bothersome.  He would like to decrease dyskinesia, if able.  03/20/14 update:  Pt is f//u today.  Overall doing well.  He tried to decrease his carbidopa/levodopa 25/100 from one tablet 4 times per day to 1/0.5/0.5/1, but ended up going back up to one tablet 4 times per day.  He finds that he is due for his second dose at 11 AM, but by 10:30 AM he is shuffling, and he thinks it is primarily the right foot.  When he goes hiking, he states that he takes an extra half tablet at 10:30 AM and as well and  often takes another half tablet in the afternoon.  He finds that protein interferes with the levodopa dosing.  He continues to take 2 Flexeril at night.  He does not know if it helps, but has not tried to stop that since the DBS surgery.  He finds that the only time he has dyskinesia is at the end of the dose.  He asks me if we can slightly increase the DBS stimulation.  He continues to exercise.  No falls.  No hallucinations.  05/18/14 update: The patient is following up today, accompanied by his wife who supplements the history.  I tried to slightly decrease the levodopa, but the patient is was not able to do that.  He remains on carbidopa/levodopa 25/100, 1 tablet 4 times per day.  He is also on Mirapex 1 mg 3 times per day along with Azilect 1 mg daily. C/o some "slight" freezing episodes and med wearing off 1/2 hr before due but at the same time having frequent dyskinesia of the R leg.  No falls.  Staying very active.  No hallucinations.  C/o cost of azilect and asks about its benefit.    08/08/14 update:  Overall, the patient is doing well.  He has not taken medication since 6:30 this morning, and wanted me to see him off of medication.  He states that he has some "spotty freezing."  He asks about potentially taking metformin to help him.  He also asks about levodopa nasal spray.  He asks about several other experimental treatments.  He has occasional dyskinesia in the right foot but it is not bothersome to him.  Overall, he feels slightly slower than he used to.  He states that he generally is supposed to take his levodopa at 11 AM but he needs it by 10:30.  If he is very active he will need to take a few extra half dosages of levodopa throughout the day.  Generally, however, he takes 4 tablets of carbidopa/levodopa 25/100 throughout the day in addition to entacapone 200 mg twice a day and Mirapex 1 mg 3 times a day.  He is off of Azilect and wants to know if he should go back on it.  He noticed no  difference when discontinuing it.  He is off of Flexeril as well as noticed no trouble with that.  10/10/14 update:  Overall, the patient is doing well.  He is on carbidopa/levodopa 25/100 4 times per day.  He takes entacapone 200 mg with the  first and third dose of the levodopa.  He really does not notice that it helps and states that he still feels "jerky" 30 minutes before his next dosage is due.  He will often just take the levodopa and then.  He is on Mirapex 1 mg 3 times a day.  I added carbidopa/levodopa 50/200 CR at night because of cramping and the patient states that it helps but he rarely takes it because he thinks the dyskinesia that worse when he did it.  He states that he uses it if he really needs to "get up and go."  He does use Flexeril at night for cramping and thinks it helped as much as the carbidopa/levodopa 50/200 CR.  He did turn down the DBS device somewhat since last visit because of dyskinesia.  He states that he takes Azilect "off and on" because he was told by a previous neurologist that it was neuroprotective.  He asks me about isradapine.  02/12/15 update:  The patient returns today for follow-up.  I have communicated with him several times since last visit.  He called with complaints of freezing or before his next dose and we ended up slightly increasing his levodopa so that he is taking 1-1/2 tablets in the morning, one tablet at his next dose, 1-1/2 tablets in the mid afternoon and then 1 tablet in the later evening.  This helped but he states that sometimes he will take 1 in the AM and the 1/2 an hour or 2 later.  He still only takes 3-4 a day.  He takes entacapone with every other dose of the levodopa.  He is on pramipexole 1 mg 3 times per day.  Dyskinesia has been problematic despite the fact he complains about freezing and we ended up adding amantadine, 100 mg 3 times a day for that. It helped that but caused constipation and dry mouth.   He had 2 falls since last visit, one  of which was at the outer edge of my office when he came to bring me something.  He quickly turned around while carrying something and fell to his knee.  He did not get hurt.   He states that if he gets in a crowded space his feet will just get tangled together.   He does know that if he gets into "shuffle mode" he needs to stop and "reset."  He states overall shuffling is much better.  He asked me about several studies since our last visit, but these are not available to him because of the fact he has already had DBS.  He asks me again about nilotinib study.  05/15/15 update:  The patient is following up today regarding his Parkinson's disease.  States that he has been "fantastic."  He did have a postoperative MRI done since our last visit.  This is first he has ever had since his surgery.  The [facility] plane is approximately at contact number 2 on the lead.  We are currently actively using contact 1.  We have used contact 2 in the past successfully.  He is currently on carbidopa/levodopa 25/100; he has gone down a bit on this to about 4 and sometimes 4.5 tablets a day.  Is on Comtan 200 mg twice a day, Mirapex 1 mg 3 times a day and amantadine 100 mg 3 times a day for dyskinesia.  If he gets nervous, his legs will "twitch."  He is exercising on the bike in addition to doing physical labor.  Dropped his own programming to 3.2 and thinks that it has helped.  Did have 1-2 falls when "I get in a hurry."  Never got hurt.  Notices more trouble when he is at end of dosage.  08/15/15 update:  The patient is following up today regarding his Parkinson's disease.  He is on carbidopa/levodopa 25/100, 4- tablets per day (and then will take 2 extra 1/2 tablets as needed) along with entacapone, 200 mg twice a day.  He also takes pramipexole, 1 mg 3 times per day and amantadine 100 mg 3 times a day for dyskinesia.   Amantadine is causing dry mouth and he is using biotene to help that.   He has called several times since last  visit asking me about medical marijuana and its place in the treatment of Parkinson's disease.  I have talked to him about the position statement by the [employer].  He has not fallen since our last visit.  He reports increased dyskinesia only when he has increased stress.  Reports that he changed his DBS setting again back to where I had it previously (3.4).  He uses 2 flexeril at night for cramping.  He brings me articles to read on natural "supplements" and also asks me to look up a center in [place] that is owned by a "chiropractic neurologist" and has week long "camps" for patient to have Parkinson's disease that are supposed to help with healing of Parkinson's disease and not paid by insurance.  12/13/15 update:  The patient is following up today regarding his Parkinson's disease.  He is on carbidopa/levodopa 25/100, 4-5 tablets per day along with entacapone, 200 mg twice a day.  He also takes pramipexole, 1 mg 3 times per day and amantadine 100 mg 3 times a day for dyskinesia.  I reviewed records since last visit.  Fell at home on concrete on 11/30/15 and injured L calf.  Saw PCP few days later. Is better now.  States that he has learned that he just has to "stop" when he has a festinating gait.   No hallucinations.  No lightheadedness.    04/28/16 update:  He is on carbidopa/levodopa 25/100, 4-5 tablets per day (usually 4) along with entacapone, 200 mg twice a day.  He also takes pramipexole, 1 mg 3 times per day and amantadine 100 mg 3 times a day for dyskinesia.  He had started taking carbidopa/levodopa 50/200, one tablet in the AM as he thinks that this "gives me a 3 hour window before it is done."  He usually doesn't take it but sometimes he does.    This is odd dosing but he has rearranged his own medication to best suite him.  He has been physical therapy since our last visit.  He does think that it was helpful.  A U Step walker was recommended.  He isn't using it while out and  asks me what he can do for falls.  He does state that "my biggest falls are at home."  He does keep the walker next to him at bedtime because he finds that this is the most troublesome time when he gets up.  He finds that the top of his body gets ahead of the legs and that is why he falls.  Noting some trouble moving in confined spaces.  Reports that "I changed the DBS to 3.2 to see if it would help" but it didn't.  He is going to trainer 4-5 days a week.  Asks  if okay to hunt quail/fish if with someone.    PREVIOUS MEDICATIONS: Sinemet, Mirapex and azilect  ALLERGIES:   Allergies  Allergen Reactions  . Amoxicillin   . Celebrex [Celecoxib]   . Demerol Other (See Comments)    Hallucinations   . Penicillins     REACTION: RASH    CURRENT MEDICATIONS:  Current Outpatient Prescriptions on File Prior to Visit  Medication Sig Dispense Refill  . amantadine (SYMMETREL) 100 MG capsule TAKE ONE CAPSULE THREE TIMES A DAY 90 capsule 5  . carbidopa-levodopa (SINEMET CR) 50-200 MG tablet TAKE 1 TABLET BY MOUTH DAILY AS NEEDED 90 tablet 0  . carbidopa-levodopa (SINEMET IR) 25-100 MG tablet TAKE ONE TABLET FIVE TIMES A DAY (Patient taking differently: TAKE ONE TABLET Four TIMES A DAY, 1/2 tablet 1-2 times daily) 150 tablet 5  . clonazePAM (KLONOPIN) 0.5 MG tablet Take 1-2 tablets (0.5-1 mg total) by mouth at bedtime as needed. 60 tablet 0  . cyclobenzaprine (FLEXERIL) 10 MG tablet TAKE 2 TABLETS BY MOUTH AT BEDTIME 60 tablet 5  . entacapone (COMTAN) 200 MG tablet TAKE ONE TABLET TWICE A DAY 60 tablet 5  . pramipexole (MIRAPEX) 1 MG tablet TAKE 1 TABLET 3 TIMES DAILY 90 tablet 5   No current facility-administered medications on file prior to visit.     PAST MEDICAL HISTORY:   Past Medical History:  Diagnosis Date  . Arthritis    left hip   . GERD (gastroesophageal reflux disease) Pre 2002   Mild  . History of kidney stones   . 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 Parkinson disease ([place]) 2004   Slowly progressive  . Ruptured appendix teens    PAST SURGICAL HISTORY:   Past Surgical History:  Procedure Laterality Date  . APPENDECTOMY    . DEEP BRAIN STIMULATOR PLACEMENT  8/14   L STN  . DOPPLER ECHOCARDIOGRAPHY  01/10/2009   LV NML Mild LVH EF 60-65% aortic sclerosis w/0 stenosis   . JOINT REPLACEMENT     left hip replacement 08/14/11/[place]   . Neuro evaluation  12/19/2004   [name], benign tremor  . TOTAL HIP REVISION  08/27/2011   Procedure: TOTAL HIP REVISION;  Surgeon: Mauri Pole, MD;  Location: [facility];  Service: Orthopedics;  Laterality: Left;    SOCIAL HISTORY:   Social History   Social History  . Marital status: Married    Spouse name: N/A  . Number of children: 2  . Years of education: N/A   Occupational History  . Optometrist     medically retired   Social History Main Topics  . Smoking status: Never Smoker  . Smokeless tobacco: Never Used  . Alcohol use 3.5 oz/week    7 drink(s) per week     Comment: occassionally  . Drug use: No  . Sexual activity: Yes   Other Topics Concern  . Not on file   Social History Narrative   Married, lives with wife   2 daughters      Has living will   Wife has health care POA---then daughters   Would still accept CPR--but no prolonged artificial means (ventilator or tube feeds)    FAMILY HISTORY:   Family Status  Relation Status  . Mother Deceased at age 33   knee replacement  . Father Deceased at age 104   tremor  . Sister Product manager in [place], [place]  ROS:  A complete 10 system review of systems was obtained and was unremarkable apart from what is mentioned above.  PHYSICAL EXAMINATION:    VITALS:   Vitals:   04/28/16 1052  BP: 130/60  Pulse: 82  Weight: 164 lb (74.4 kg)  Height: _0  (1.854 m)    GEN:  The patient appears stated age and is in NAD. HEENT:  Normocephalic, atraumatic.   The mucous membranes are moist. The superficial temporal arteries are without ropiness or tenderness. CV: RRR Lungs:  CTAB Neck: no bruits   Neurological examination:  Orientation: The patient is alert and oriented x3.  Cranial nerves: There is good facial symmetry.  There is minimal facial hypomimia.  Pupils are equal round and reactive to light bilaterally.  The speech is slightly dysarthric but fluent. Soft palate rises symmetrically and there is no tongue deviation. Hearing is intact to conversational tone. Motor: Strength is 5/5 in the bilateral upper and lower extremities.   Shoulder shrug is equal and symmetric.  There is no pronator drift.  Movement examination: Tone: There is normal tone in the bilateral upper extremities.  The tone in the lower extremities is normal.  Abnormal movements: There is significant dyskinesia in the R leg Coordination:  There is good RAMs today Gait and Station: The patient has no difficulty arising out of a deep-seated chair and he isn't "running" down the hall (as I reminded him to go slow) but he is dyskinetic in the R more than L arm.  He does have start hesitation.    DBS programming was performed today which is described in more detail on a separate programming procedural notes.    ASSESSMENT/PLAN:  1. Parkinson's disease.    -The patient is status post left STN DBS at [facility] on 03/14/2013.  -remain on the carbidopa/levodopa 25/100, which he has gone down on to 4-5 tablets/day and comtan 200 mg bid.  He has started taking carbidopa/levodopa 50/200, 1 in the AM prn  -For now, he will remain on the Mirapex, 1 mg 3 times per day and amantadine 100 mg tid, which is helping dyskinesia.  He has some dry mouth with the amantadine, but uses Biotene.  -I did talk to patient re: diagnosis.  I would have loved to seen him early on in the disease.  By definition, patients with PD have to have sx's on the "good" side of the body within 3 years of diagnosis, and  he has never had sx's on the opposite side.  This has really called into question, for me, the diagnosis but I cannot deny that he has done much better post DBS (per his reports)  -DBS programming was performed today.    -no issue with hunting quail/fishing as long as someone with him.  Don't want him on uneven surface without someone right with him.    -talked about walker and told him he needs to use at all times.  Pt resistant to that idea  -asked about [employer] and told him that I believe that [place] will be starting program soon.  Met with our social worker to get contact info. 2.  Constipation  -Was made worse by amantadine.  He is using the rancho recipe.  He is using Biotene for the dry mouth but thinks that is from the amantadine as well and it may be. 3.  Much greater than 50% of the 25 minute visit (doesn't include DBS time) was in counseling and coordinating care, as  above.

## 2016-04-22 ENCOUNTER — Other Ambulatory Visit: Payer: Self-pay | Admitting: Neurology

## 2016-04-23 ENCOUNTER — Ambulatory Visit: Payer: Medicare Other | Admitting: Neurology

## 2016-04-23 ENCOUNTER — Other Ambulatory Visit: Payer: Self-pay

## 2016-04-23 MED ORDER — CLONAZEPAM 0.5 MG PO TABS: 0.5000 mg | ORAL_TABLET | Freq: Every evening | ORAL | 0 refills | Status: DC | PRN

## 2016-04-23 NOTE — Telephone Encounter (Signed)
Last filled 03-17-16 #60 Last OV 04-30-15 Next OV 05-05-16

## 2016-04-23 NOTE — Telephone Encounter (Signed)
Approved: #60 x 0 

## 2016-04-23 NOTE — Telephone Encounter (Signed)
Verbal refill given to Amy at the pharmacy 

## 2016-04-28 ENCOUNTER — Ambulatory Visit (INDEPENDENT_AMBULATORY_CARE_PROVIDER_SITE_OTHER): Payer: Medicare Other | Admitting: Neurology

## 2016-04-28 ENCOUNTER — Encounter: Payer: Self-pay | Admitting: Neurology

## 2016-04-28 VITALS — BP 130/60 | HR 82 | Ht 73.0 in | Wt 164.0 lb

## 2016-04-28 DIAGNOSIS — G20 Parkinson's disease: Secondary | ICD-10-CM

## 2016-04-28 DIAGNOSIS — G249 Dystonia, unspecified: Secondary | ICD-10-CM | POA: Diagnosis not present

## 2016-04-28 DIAGNOSIS — Z9689 Presence of other specified functional implants: Secondary | ICD-10-CM | POA: Diagnosis not present

## 2016-04-28 NOTE — Progress Notes (Signed)
Clinical Social Work Note  CSW received request from Dr. Carles Collet to meet with pt during today's follow up visit and Dr. Carles Collet notified this [place] [place] that pt was interested in more information about [employer]. CSW met with pt alone in exam room. CSW introduced self and explained role. Informed pt of social work services including connection to community resources or Scientist, clinical (histocompatibility and immunogenetics) and short-term coping/problem-solving counseling), and community involvement with parkinson's population. CSW provided supportive listening as pt discussed that he lives in [place]. Pt shared that he currently exercises 4-5 times a week at [employer]. CSW provided positive reinforcement to pt for commitment to exercising frequently. Pt discussed interest in [employer]. CSW discussed with pt that [facility] is working toward creating a [employer] Class that will meet at their facility, but open to the public. CSW provided information for Chip Boer who is a Advertising copywriter for [employer] and who will facilitate the class at [facility] once it is established. CSW encourage pt to contact [place] or [facility] to inquire about timeline for when class may be available. Pt states that he knows individuals on the board at [facility] and plans to contact them for further information. Pt appreciative of CSW visit and assistance with resource. CSW provided pt with CSW contact information and pt has agreed to contact me if I can be of further assistance with any other social work services before next office visit.  Alison Murray, MSW, LCSW Clinical Social Worker [facility] [phone]

## 2016-04-28 NOTE — Procedures (Signed)
DBS Programming was performed.    Total time spent reviewing programming was 20 minutes.  Device was confirmed to be on.  Soft start was confirmed to be on.  Impedences were checked and were within normal limits.   All impedances were well within normal limits and are recorded on a separate worksheet.  Battery was checked and was determined to be functioning normally and not near the end of life (2.84V).  Final settings were as follows:  Left brain electrode, Group A:     2-C+; Amplitude  3.5   V   ; Pulse width 60 microseconds;   Frequency   140   Hz.  Left brain electrode, group B:  1-C+, amplitude: 3.1 V, PW: 90, frequency 140  Group B was active at the time pt left the office.  Right brain electrode:     n/a

## 2016-05-05 ENCOUNTER — Encounter: Payer: Self-pay | Admitting: Internal Medicine

## 2016-05-05 ENCOUNTER — Ambulatory Visit (INDEPENDENT_AMBULATORY_CARE_PROVIDER_SITE_OTHER): Payer: Medicare Other | Admitting: Internal Medicine

## 2016-05-05 VITALS — BP 110/60 | HR 76 | Temp 97.4°F | Ht 71.5 in | Wt 162.0 lb

## 2016-05-05 DIAGNOSIS — Z872 Personal history of diseases of the skin and subcutaneous tissue: Secondary | ICD-10-CM | POA: Diagnosis not present

## 2016-05-05 DIAGNOSIS — Z0000 Encounter for general adult medical examination without abnormal findings: Secondary | ICD-10-CM | POA: Diagnosis not present

## 2016-05-05 DIAGNOSIS — Z7189 Other specified counseling: Secondary | ICD-10-CM

## 2016-05-05 DIAGNOSIS — G20 Parkinson's disease: Secondary | ICD-10-CM | POA: Diagnosis not present

## 2016-05-05 DIAGNOSIS — X32XXXA Exposure to sunlight, initial encounter: Secondary | ICD-10-CM | POA: Diagnosis not present

## 2016-05-05 DIAGNOSIS — Z85828 Personal history of other malignant neoplasm of skin: Secondary | ICD-10-CM | POA: Diagnosis not present

## 2016-05-05 DIAGNOSIS — K219 Gastro-esophageal reflux disease without esophagitis: Secondary | ICD-10-CM

## 2016-05-05 DIAGNOSIS — G20A1 Parkinson's disease without dyskinesia, without mention of fluctuations: Secondary | ICD-10-CM

## 2016-05-05 DIAGNOSIS — D2261 Melanocytic nevi of right upper limb, including shoulder: Secondary | ICD-10-CM | POA: Diagnosis not present

## 2016-05-05 DIAGNOSIS — I350 Nonrheumatic aortic (valve) stenosis: Secondary | ICD-10-CM | POA: Diagnosis not present

## 2016-05-05 DIAGNOSIS — D225 Melanocytic nevi of trunk: Secondary | ICD-10-CM | POA: Diagnosis not present

## 2016-05-05 DIAGNOSIS — L570 Actinic keratosis: Secondary | ICD-10-CM | POA: Diagnosis not present

## 2016-05-05 DIAGNOSIS — Z1211 Encounter for screening for malignant neoplasm of colon: Secondary | ICD-10-CM

## 2016-05-05 LAB — CBC WITH DIFFERENTIAL/PLATELET
BASOS PCT: 0.5 % (ref 0.0–3.0)
Basophils Absolute: 0 10*3/uL (ref 0.0–0.1)
EOS PCT: 2.9 % (ref 0.0–5.0)
Eosinophils Absolute: 0.1 10*3/uL (ref 0.0–0.7)
HCT: 35 % — ABNORMAL LOW (ref 39.0–52.0)
Hemoglobin: 12 g/dL — ABNORMAL LOW (ref 13.0–17.0)
LYMPHS ABS: 1.1 10*3/uL (ref 0.7–4.0)
Lymphocytes Relative: 23.9 % (ref 12.0–46.0)
MCHC: 34.3 g/dL (ref 30.0–36.0)
MONO ABS: 0.6 10*3/uL (ref 0.1–1.0)
MONOS PCT: 13.7 % — AB (ref 3.0–12.0)
NEUTROS ABS: 2.6 10*3/uL (ref 1.4–7.7)
NEUTROS PCT: 59 % (ref 43.0–77.0)
Platelets: 264 10*3/uL (ref 150.0–400.0)
RBC: 3.16 Mil/uL — AB (ref 4.22–5.81)
RDW: 13.9 % (ref 11.5–15.5)
WBC: 4.4 10*3/uL (ref 4.0–10.5)

## 2016-05-05 LAB — COMPREHENSIVE METABOLIC PANEL
ALK PHOS: 71 U/L (ref 39–117)
ALT: 5 U/L (ref 0–53)
AST: 18 U/L (ref 0–37)
Albumin: 4 g/dL (ref 3.5–5.2)
BUN: 19 mg/dL (ref 6–23)
CHLORIDE: 102 meq/L (ref 96–112)
CO2: 30 mEq/L (ref 19–32)
Calcium: 9 mg/dL (ref 8.4–10.5)
Creatinine, Ser: 0.76 mg/dL (ref 0.40–1.50)
GFR: 106.39 mL/min (ref >60.00)
GLUCOSE: 88 mg/dL (ref 70–99)
Potassium: 3.9 mEq/L (ref 3.5–5.1)
SODIUM: 137 meq/L (ref 135–145)
TOTAL PROTEIN: 6.5 g/dL (ref 6.0–8.3)
Total Bilirubin: 1.8 mg/dL — ABNORMAL HIGH (ref 0.2–1.2)

## 2016-05-05 NOTE — Assessment & Plan Note (Signed)
I have personally reviewed the Medicare Annual Wellness questionnaire and have noted 1. The patient's medical and social history 2. Their use of alcohol, tobacco or illicit drugs 3. Their current medications and supplements 4. The patient's functional ability including ADL's, fall risks, home safety risks and hearing or visual             impairment. 5. Diet and physical activities 6. Evidence for depression or mood disorders  The patients weight, height, BMI and visual acuity have been recorded in the chart I have made referrals, counseling and provided education to the patient based review of the above and I have provided the pt with a written personalized care plan for preventive services.  I have provided you with a copy of your personalized plan for preventive services. Please take the time to review along with your updated medication list.  Will do FIT No PSA due to age Works hard on fitness Flu vaccine already

## 2016-05-05 NOTE — Assessment & Plan Note (Signed)
Doing well with the DBS Continues with Dr Tat

## 2016-05-05 NOTE — Assessment & Plan Note (Signed)
No symptoms 

## 2016-05-05 NOTE — Assessment & Plan Note (Signed)
See social history 

## 2016-05-05 NOTE — Assessment & Plan Note (Signed)
No recent problems  °

## 2016-05-05 NOTE — Progress Notes (Signed)
Subjective:    Patient ID: Casey Tucker, male    DOB: 31-Aug-1940, 75 y.o.   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 Wife is going through some medical issues--so this is tough  Doing fairly well Does fall once in while--if he is in a hurry No cane--other than at night going to the bathroom Overall, still very happy with the DBS Uses clonazepam at night at times--if his mind is churning   Gets recurrent clogging in right ear with cerumen Wants this checked  Has lesion on left 3rd finger Heals up --then comes back No sig pain Started about a month ago Did have initial injury in shop about a month ago  No recent heartburn No dysphagia  Current Outpatient Prescriptions on File Prior to Visit  Medication Sig Dispense Refill  . amantadine (SYMMETREL) 100 MG capsule TAKE ONE CAPSULE THREE TIMES A DAY 90 capsule 5  . carbidopa-levodopa (SINEMET CR) 50-200 MG tablet TAKE 1 TABLET BY MOUTH DAILY AS NEEDED 90 tablet 0  . carbidopa-levodopa (SINEMET IR) 25-100 MG tablet TAKE ONE TABLET FIVE TIMES A DAY (Patient taking differently: TAKE ONE TABLET Four TIMES A DAY, 1/2 tablet 1-2 times daily) 150 tablet 5  . clonazePAM (KLONOPIN) 0.5 MG tablet Take 1-2 tablets (0.5-1 mg total) by mouth at bedtime as needed. 60 tablet 0  . cyclobenzaprine (FLEXERIL) 10 MG tablet TAKE 2 TABLETS BY MOUTH AT BEDTIME 60 tablet 5  . entacapone (COMTAN) 200 MG tablet TAKE ONE TABLET TWICE A DAY 60 tablet 5  . pramipexole (MIRAPEX) 1 MG tablet TAKE 1 TABLET 3 TIMES DAILY 90 tablet 5   No current  facility-administered medications on file prior to visit.     Allergies  Allergen Reactions  . Amoxicillin   . Celebrex [Celecoxib]   . Demerol Other (See Comments)    Hallucinations   . Penicillins     REACTION: RASH    Past Medical History:  Diagnosis Date  . Arthritis    left hip   . GERD (gastroesophageal reflux disease) Pre 2002   Mild  . History of kidney stones   . 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 Parkinson disease ([place]) 2004   Slowly progressive  . Ruptured appendix teens    Past Surgical History:  Procedure Laterality Date  . APPENDECTOMY    . DEEP BRAIN STIMULATOR PLACEMENT  8/14   L STN  . DOPPLER ECHOCARDIOGRAPHY  01/10/2009   LV NML Mild LVH EF 60-65% aortic sclerosis w/0 stenosis   . JOINT REPLACEMENT     left hip replacement 08/14/11/[place]   . Neuro evaluation  12/19/2004   [name], benign tremor  . TOTAL HIP REVISION  08/27/2011   Procedure: TOTAL HIP REVISION;  Surgeon: Mauri Pole, MD;  Location: [facility];  Service: Orthopedics;  Laterality: Left;    Family History  Problem Relation Age of Onset  . Arthritis Mother     knee replacement  . COPD Father     emphysema, smoker  . Heart disease Father     CHF  . Alcohol  abuse Paternal Uncle     Social History   Social History  . Marital status: Married    Spouse name: N/A  . Number of children: 2  . Years of education: N/A   Occupational History  . Optometrist     medically retired   Social History Main Topics  . Smoking status: Never Smoker  . Smokeless tobacco: Never Used  . Alcohol use 3.5 oz/week    7 drink(s) per week     Comment: occassionally  . Drug use: No  . Sexual activity: Yes   Other Topics Concern  . Not on file   Social History Narrative   Married, lives with wife   2 daughters      Has living will   Wife has health care POA---then daughters   Would still accept CPR--but  no prolonged artificial means (ventilator or tube feeds)   Review of Systems Appetite is okay Weight is down a few pounds---working on eating healthier Usually sleeps okay Teeth are good-- keeps up with dentist (Dr Carman Ching) Chronic dry mouth due to amantadine--uses mouth rinse Bowels are good. No blood in stool Voids fine. Will just have some AM urgency (upon awakening) No chest pain or SOB No dizziness or syncope No edema No back or joint pain (still hunts and fishes)    Objective:   Physical Exam  Constitutional: He is oriented to person, place, and time. He appears well-developed and well-nourished. No distress.  HENT:  Mouth/Throat: Oropharynx is clear and moist. No oropharyngeal exudate.  Cerumen on right (will let Dr Thornell Mule take it out)  Neck: Normal range of motion. Neck supple. No thyromegaly present.  Cardiovascular: Normal rate, regular rhythm and intact distal pulses.  Exam reveals no gallop.   Soft aortic systolic murmur  Pulmonary/Chest: Effort normal and breath sounds normal. No respiratory distress. He has no wheezes. He has no rales.  Abdominal: Soft. There is no tenderness.  Musculoskeletal: He exhibits no edema or tenderness.  Lymphadenopathy:    He has no cervical adenopathy.  Neurological: He is alert and oriented to person, place, and time.  President-- "Dwaine Deter, Bush" [phone] [id number] Recall 3/3  Mild resting tremor No sig bradykinesia  Skin: No rash noted. No erythema.  Scaling lesion on left 3rd PIP--- looks like a healing lesion  Psychiatric: He has a normal mood and affect. His behavior is normal.          Assessment & Plan:

## 2016-05-05 NOTE — Progress Notes (Signed)
Pre visit review using our clinic review tool, if applicable. No additional management support is needed unless otherwise documented below in the visit note. 

## 2016-05-07 ENCOUNTER — Other Ambulatory Visit: Payer: Self-pay | Admitting: Neurology

## 2016-05-13 ENCOUNTER — Other Ambulatory Visit (INDEPENDENT_AMBULATORY_CARE_PROVIDER_SITE_OTHER): Payer: Medicare Other

## 2016-05-13 DIAGNOSIS — Z1211 Encounter for screening for malignant neoplasm of colon: Secondary | ICD-10-CM | POA: Diagnosis not present

## 2016-05-13 LAB — FECAL OCCULT BLOOD, IMMUNOCHEMICAL: Fecal Occult Bld: NEGATIVE

## 2016-05-18 ENCOUNTER — Telehealth: Payer: Self-pay | Admitting: Neurology

## 2016-05-18 DIAGNOSIS — H353230 Exudative age-related macular degeneration, bilateral, stage unspecified: Secondary | ICD-10-CM | POA: Diagnosis not present

## 2016-05-18 NOTE — Telephone Encounter (Signed)
Patient states he is not using a walker. I advised that he needs to be using one to help prevent falls. He will call back with any issues. (he did not hit head, no LOC with fall.)

## 2016-05-18 NOTE — Telephone Encounter (Signed)
PT called and said he has been falling and would like a call back about what to do/Dawn CB# [phone]

## 2016-05-18 NOTE — Telephone Encounter (Signed)
Left message on machine for patient to call back.  To see if he is using a walker.

## 2016-05-21 DIAGNOSIS — G20 Parkinson's disease: Secondary | ICD-10-CM | POA: Diagnosis not present

## 2016-05-21 DIAGNOSIS — H6123 Impacted cerumen, bilateral: Secondary | ICD-10-CM | POA: Diagnosis not present

## 2016-05-21 DIAGNOSIS — H903 Sensorineural hearing loss, bilateral: Secondary | ICD-10-CM | POA: Diagnosis not present

## 2016-06-09 ENCOUNTER — Other Ambulatory Visit: Payer: Self-pay | Admitting: Neurology

## 2016-06-10 ENCOUNTER — Other Ambulatory Visit: Payer: Self-pay | Admitting: *Deleted

## 2016-06-10 MED ORDER — CARBIDOPA-LEVODOPA 25-100 MG PO TABS: ORAL_TABLET | ORAL | 5 refills | Status: DC

## 2016-06-10 NOTE — Telephone Encounter (Signed)
Rx sent 

## 2016-06-15 ENCOUNTER — Telehealth: Payer: Self-pay | Admitting: *Deleted

## 2016-06-15 NOTE — Telephone Encounter (Signed)
PT brought in form to be completed. Please complete and call patient when ready [phone]. Form placed in prescription tower.

## 2016-06-16 ENCOUNTER — Telehealth: Payer: Self-pay | Admitting: Neurology

## 2016-06-16 NOTE — Telephone Encounter (Signed)
I left a message on patient's voice mail that form is ready for pick up. °

## 2016-06-16 NOTE — Telephone Encounter (Signed)
Casey Tucker 04-21-2041. He was wanting to speak with you regarding him taking boxing. He was wanting to see if he could come off some medications. His # is [id number] or [phone]. Thank you

## 2016-06-16 NOTE — Telephone Encounter (Signed)
Form done No charge 

## 2016-06-16 NOTE — Telephone Encounter (Signed)
Form placed in Dr Letvak's Inbox on his desk 

## 2016-06-16 NOTE — Telephone Encounter (Signed)
Yes.  Will address next visit.  I have tried several times to reduce meds and he generally will increase them.

## 2016-06-16 NOTE — Telephone Encounter (Signed)
Spoke with patient. He states he is doing well in the boxing class and wanted to know if he should reduce his medications. Made him aware to stay on current medications and continue boxing class and she would address any medication changes at his next appt.

## 2016-06-22 ENCOUNTER — Other Ambulatory Visit: Payer: Self-pay | Admitting: *Deleted

## 2016-06-22 MED ORDER — CLONAZEPAM 0.5 MG PO TABS: 0.5000 mg | ORAL_TABLET | Freq: Every evening | ORAL | 0 refills | Status: DC | PRN

## 2016-06-22 NOTE — Telephone Encounter (Signed)
Approved: #60 x 0 

## 2016-06-22 NOTE — Telephone Encounter (Signed)
Last office visit 05/05/2016.  Last refilled 04/23/2016 for #60 with no refills.  Ok to refill?

## 2016-06-22 NOTE — Telephone Encounter (Signed)
Clonazepam called into [facility].

## 2016-07-09 ENCOUNTER — Other Ambulatory Visit: Payer: Self-pay | Admitting: Neurology

## 2016-07-15 ENCOUNTER — Telehealth: Payer: Self-pay | Admitting: Neurology

## 2016-07-15 NOTE — Telephone Encounter (Signed)
Patient would like to speak to [facility] please call [phone] he hung before I could find out about what

## 2016-07-15 NOTE — Telephone Encounter (Signed)
Left message on machine for patient to call back.

## 2016-08-07 ENCOUNTER — Telehealth: Payer: Self-pay | Admitting: Neurology

## 2016-08-07 NOTE — Telephone Encounter (Signed)
PT has a question regarding his medication/Dawn CB# [phone]

## 2016-08-07 NOTE — Telephone Encounter (Signed)
Please advise 

## 2016-08-07 NOTE — Telephone Encounter (Signed)
Contacted patient. He states his insurance company request prior-auth be done on Flexeril.

## 2016-08-10 NOTE — Telephone Encounter (Signed)
Called and spoke with [facility] and they state that the cash price is only $8 for prescription and they never run Flexeril through insurance because it doesn't get covered. They new pharmacist was not aware. They will call patient and let him know.

## 2016-08-11 ENCOUNTER — Telehealth: Payer: Self-pay | Admitting: Neurology

## 2016-08-11 DIAGNOSIS — R52 Pain, unspecified: Secondary | ICD-10-CM

## 2016-08-11 DIAGNOSIS — R262 Difficulty in walking, not elsewhere classified: Secondary | ICD-10-CM | POA: Diagnosis not present

## 2016-08-11 NOTE — Telephone Encounter (Signed)
Has he tried to take an extra levodopa when it happens?  Lets also get some labs, including UA with c&s, chem, b12, cbc.  My schedule is really crazy for the next few weeks but I would want labs anyway and put him on cx list

## 2016-08-11 NOTE — Telephone Encounter (Signed)
Spoke with patient and he states this weekend his batteries ran out in his remote. He changed them but he hasn't felt right since. He insists the device is on. He is having a lot of pain in his legs - like his muscles are stretching - and having trouble walking. He would like to see you as soon as possible. Please advise.

## 2016-08-11 NOTE — Telephone Encounter (Signed)
Patient is on the wait list.

## 2016-08-11 NOTE — Telephone Encounter (Signed)
PT called and said he is having trouble walking/Dawn CB# [phone]

## 2016-08-11 NOTE — Telephone Encounter (Signed)
Patient made aware. He will go to [facility] in [place]. Will fax orders.   Casey Tucker- can you put on cancellation list please?

## 2016-08-12 ENCOUNTER — Telehealth: Payer: Self-pay | Admitting: Neurology

## 2016-08-12 LAB — URINALYSIS
Bilirubin, UA: NEGATIVE
Glucose, UA: NEGATIVE
LEUKOCYTES UA: NEGATIVE
Nitrite, UA: NEGATIVE
PH UA: 5.5 (ref 5.0–7.5)
RBC UA: NEGATIVE
SPEC GRAV UA: 1.026 (ref 1.005–1.030)
Urobilinogen, Ur: 1 mg/dL (ref 0.2–1.0)

## 2016-08-12 LAB — CBC WITH DIFFERENTIAL/PLATELET
BASOS: 1 %
Basophils Absolute: 0 10*3/uL (ref 0.0–0.2)
EOS (ABSOLUTE): 0.4 10*3/uL (ref 0.0–0.4)
Eos: 6 %
HEMATOCRIT: 34.9 % — AB (ref 37.5–51.0)
Hemoglobin: 12.5 g/dL — ABNORMAL LOW (ref 13.0–17.7)
Immature Grans (Abs): 0 10*3/uL (ref 0.0–0.1)
Immature Granulocytes: 0 %
Lymphocytes Absolute: 1.3 10*3/uL (ref 0.7–3.1)
Lymphs: 19 %
MCH: 38.2 pg — AB (ref 26.6–33.0)
MCHC: 35.8 g/dL — ABNORMAL HIGH (ref 31.5–35.7)
MCV: 107 fL — AB (ref 79–97)
MONOS ABS: 1 10*3/uL — AB (ref 0.1–0.9)
Monocytes: 15 %
Neutrophils Absolute: 4.1 10*3/uL (ref 1.4–7.0)
Neutrophils: 59 %
PLATELETS: 314 10*3/uL (ref 150–379)
RBC: 3.27 x10E6/uL — ABNORMAL LOW (ref 4.14–5.80)
RDW: 14.1 % (ref 12.3–15.4)
WBC: 6.9 10*3/uL (ref 3.4–10.8)

## 2016-08-12 LAB — COMPREHENSIVE METABOLIC PANEL
A/G RATIO: 2 (ref 1.2–2.2)
ALBUMIN: 4.3 g/dL (ref 3.5–4.8)
ALT: 19 IU/L (ref 0–44)
AST: 17 IU/L (ref 0–40)
Alkaline Phosphatase: 86 IU/L (ref 39–117)
BUN / CREAT RATIO: 26 — AB (ref 10–24)
BUN: 18 mg/dL (ref 8–27)
Bilirubin Total: 1.2 mg/dL (ref 0.0–1.2)
CHLORIDE: 102 mmol/L (ref 96–106)
CO2: 26 mmol/L (ref 18–29)
Calcium: 8.6 mg/dL (ref 8.6–10.2)
Creatinine, Ser: 0.68 mg/dL — ABNORMAL LOW (ref 0.76–1.27)
GFR calc non Af Amer: 94 mL/min/{1.73_m2} (ref >59)
GFR, EST AFRICAN AMERICAN: 109 mL/min/{1.73_m2} (ref >59)
Globulin, Total: 2.1 g/dL (ref 1.5–4.5)
Glucose: 102 mg/dL — ABNORMAL HIGH (ref 65–99)
POTASSIUM: 4.5 mmol/L (ref 3.5–5.2)
Sodium: 142 mmol/L (ref 134–144)
TOTAL PROTEIN: 6.4 g/dL (ref 6.0–8.5)

## 2016-08-12 LAB — VITAMIN B12: Vitamin B-12: 734 pg/mL (ref 232–1245)

## 2016-08-12 NOTE — Telephone Encounter (Signed)
PT called and wanted to let you know he had blood work done yesterday and also has questions about the DBS/Dawn CB# [phone]

## 2016-08-12 NOTE — Telephone Encounter (Signed)
Spoke with patient and made aware that labs okay except dehydration. He will increase water intake. He was advised he can take an extra Levodopa if needed (see last phone message per Dr. Carles Collet) and we will call him for sooner appt with any cancellations.

## 2016-08-12 NOTE — Telephone Encounter (Signed)
-----   Message from Reynolds, DO sent at 08/12/2016  8:59 AM EST ----- Most labs look okay/stable.  He looks a little dehydrated though.  Is he drinking water and plenty of it?

## 2016-08-12 NOTE — Telephone Encounter (Signed)
Casey Tucker [birth year]. He was returning your call regarding lis lab work # [phone]. Thank you

## 2016-08-12 NOTE — Telephone Encounter (Signed)
Left message on machine for patient to call back.

## 2016-08-29 ENCOUNTER — Other Ambulatory Visit: Payer: Self-pay | Admitting: Internal Medicine

## 2016-08-31 NOTE — Telephone Encounter (Signed)
Last filled 06-22-16 #60 Last OV 05-05-16 Next OV 05-11-17  Forwarding to Dr. Damita Dunnings in Dr Alla German absence

## 2016-08-31 NOTE — Progress Notes (Signed)
1`   Casey Tucker was seen today in the movement disorders clinic for neurologic consultation at the request of Casey Simpler, MD.  The consultation is for the evaluation of PD.  The records that were made available to me were reviewed.  Pt is accompanied by his wife who supplements the history.   Pt reports first sx was in 2006.  Pt is R hand dominant.  Pt states that his next door neighbor (Network engineer) noted that he wasn't swinging his arm and thought that he had a stroke. He subsequently saw Casey Tucker in about 2006 and he was dx with PD.  He was started on mirapex and azilect.  It helped the overall stiffness.  Tremor developed in the R arm around the same time as the diagnosis.  Levodopa was started in approximately 2009-2010.  He also started an exercise program and he believes that has made a huge difference.  He began to have some motor fluctuations and saw Dr. Radford Tucker and had his surgery done in 02/2013.  It was a unilateral implant, placed into the L STN.  He has noted that he is sleeping much better since surgery.  No tremor.  Dr. Radford Tucker programmed the DBS one week post op.  He f/u with the NP at [facility], and he now has an A and a B setting.  He hates the A setting (doesn't walk well) and he "cranked" his own B setting up to 280 (assuming frequency) and this helped walking some but he still has problems with balance and freezing.  He is on mirapex, 1 mg tid.  He is on carbidopa/levodopa 25/100, 7:30am/11am/2pm/6pm.    11/15/13:  Pt returns today for follow up.  Overall, he has been doing well.  He is off of medication this AM for programming.  His wife reports that he has done better on his new setting.  Seems to be walking better with less dyskinesia.  Until this morning, he was still on the carbidopa/levodopa 25/100, 1 tablet 4 times per day, Azilect 1 mg daily and Mirapex 1 mg 3 times per day.  He is exercising faithfully.  He has not had any falls.  He has been shuffling a little more this  morning, since he is off of medication.  11/21/13 update:  The patient is returning for followup, earlier than expected.  The patient reports that he really had not been doing as well over the weekend.  He did not feel like he was walking as well. He felt like the setting "wore" off 30 mins before the next levodopa dosage.  His wife wonders if he wasn't doing as well because he was at the beach this past weekend and there were more stairs to negotiate.  No falls.  No hallucinations   He did decrease his carbidopa/levodopa 25/100, one tablet 3 times per day from one tablet 4 times per day.  He is still on Mirapex 1 mg 3 times per day and Azilect 1 mg daily.  12/20/13 update:  The patient returns today for followup, accompanied by his wife and daughter who supplements the history.  The patient is currently on Mirapex, 1 mg 3 times a day, Azilect 1 mg daily and carbidopa/levodopa 25/100 at 7:30 AM/11 AM/2 PM and 6:30 PM.  Overall, the patient states that he is doing much better on his current setting.  It is not seeming to "wear off."  He is having some difficulty in the middle of the night when he gets  up and sometimes has to use a cane, but otherwise is doing well.  No falls.  On one occasion, he did try to get up in the morning and go and take a walk and was able to walk a few miles without taking his medication, but then he needed it when he got home.  He has noticed some dyskinesia, but it has not been particularly bothersome.  He would like to decrease dyskinesia, if able.  03/20/14 update:  Pt is f//u today.  Overall doing well.  He tried to decrease his carbidopa/levodopa 25/100 from one tablet 4 times per day to 1/0.5/0.5/1, but ended up going back up to one tablet 4 times per day.  He finds that he is due for his second dose at 11 AM, but by 10:30 AM he is shuffling, and he thinks it is primarily the right foot.  When he goes hiking, he states that he takes an extra half tablet at 10:30 AM and as well and  often takes another half tablet in the afternoon.  He finds that protein interferes with the levodopa dosing.  He continues to take 2 Flexeril at night.  He does not know if it helps, but has not tried to stop that since the DBS surgery.  He finds that the only time he has dyskinesia is at the end of the dose.  He asks me if we can slightly increase the DBS stimulation.  He continues to exercise.  No falls.  No hallucinations.  05/18/14 update: The patient is following up today, accompanied by his wife who supplements the history.  I tried to slightly decrease the levodopa, but the patient is was not able to do that.  He remains on carbidopa/levodopa 25/100, 1 tablet 4 times per day.  He is also on Mirapex 1 mg 3 times per day along with Azilect 1 mg daily. C/o some "slight" freezing episodes and med wearing off 1/2 hr before due but at the same time having frequent dyskinesia of the R leg.  No falls.  Staying very active.  No hallucinations.  C/o cost of azilect and asks about its benefit.    08/08/14 update:  Overall, the patient is doing well.  He has not taken medication since 6:30 this morning, and wanted me to see him off of medication.  He states that he has some "spotty freezing."  He asks about potentially taking metformin to help him.  He also asks about levodopa nasal spray.  He asks about several other experimental treatments.  He has occasional dyskinesia in the right foot but it is not bothersome to him.  Overall, he feels slightly slower than he used to.  He states that he generally is supposed to take his levodopa at 11 AM but he needs it by 10:30.  If he is very active he will need to take a few extra half dosages of levodopa throughout the day.  Generally, however, he takes 4 tablets of carbidopa/levodopa 25/100 throughout the day in addition to entacapone 200 mg twice a day and Mirapex 1 mg 3 times a day.  He is off of Azilect and wants to know if he should go back on it.  He noticed no  difference when discontinuing it.  He is off of Flexeril as well as noticed no trouble with that.  10/10/14 update:  Overall, the patient is doing well.  He is on carbidopa/levodopa 25/100 4 times per day.  He takes entacapone 200 mg with the  first and third dose of the levodopa.  He really does not notice that it helps and states that he still feels "jerky" 30 minutes before his next dosage is due.  He will often just take the levodopa and then.  He is on Mirapex 1 mg 3 times a day.  I added carbidopa/levodopa 50/200 CR at night because of cramping and the patient states that it helps but he rarely takes it because he thinks the dyskinesia that worse when he did it.  He states that he uses it if he really needs to "get up and go."  He does use Flexeril at night for cramping and thinks it helped as much as the carbidopa/levodopa 50/200 CR.  He did turn down the DBS device somewhat since last visit because of dyskinesia.  He states that he takes Azilect "off and on" because he was told by a previous neurologist that it was neuroprotective.  He asks me about isradapine.  02/12/15 update:  The patient returns today for follow-up.  I have communicated with him several times since last visit.  He called with complaints of freezing or before his next dose and we ended up slightly increasing his levodopa so that he is taking 1-1/2 tablets in the morning, one tablet at his next dose, 1-1/2 tablets in the mid afternoon and then 1 tablet in the later evening.  This helped but he states that sometimes he will take 1 in the AM and the 1/2 an hour or 2 later.  He still only takes 3-4 a day.  He takes entacapone with every other dose of the levodopa.  He is on pramipexole 1 mg 3 times per day.  Dyskinesia has been problematic despite the fact he complains about freezing and we ended up adding amantadine, 100 mg 3 times a day for that. It helped that but caused constipation and dry mouth.   He had 2 falls since last visit, one  of which was at the outer edge of my office when he came to bring me something.  He quickly turned around while carrying something and fell to his knee.  He did not get hurt.   He states that if he gets in a crowded space his feet will just get tangled together.   He does know that if he gets into "shuffle mode" he needs to stop and "reset."  He states overall shuffling is much better.  He asked me about several studies since our last visit, but these are not available to him because of the fact he has already had DBS.  He asks me again about nilotinib study.  05/15/15 update:  The patient is following up today regarding his Parkinson's disease.  States that he has been "fantastic."  He did have a postoperative MRI done since our last visit.  This is first he has ever had since his surgery.  The [facility] plane is approximately at contact number 2 on the lead.  We are currently actively using contact 1.  We have used contact 2 in the past successfully.  He is currently on carbidopa/levodopa 25/100; he has gone down a bit on this to about 4 and sometimes 4.5 tablets a day.  Is on Comtan 200 mg twice a day, Mirapex 1 mg 3 times a day and amantadine 100 mg 3 times a day for dyskinesia.  If he gets nervous, his legs will "twitch."  He is exercising on the bike in addition to doing physical labor.  Dropped his own programming to 3.2 and thinks that it has helped.  Did have 1-2 falls when "I get in a hurry."  Never got hurt.  Notices more trouble when he is at end of dosage.  08/15/15 update:  The patient is following up today regarding his Parkinson's disease.  He is on carbidopa/levodopa 25/100, 4- tablets per day (and then will take 2 extra 1/2 tablets as needed) along with entacapone, 200 mg twice a day.  He also takes pramipexole, 1 mg 3 times per day and amantadine 100 mg 3 times a day for dyskinesia.   Amantadine is causing dry mouth and he is using biotene to help that.   He has called several times since last  visit asking me about medical marijuana and its place in the treatment of Parkinson's disease.  I have talked to him about the position statement by the [employer].  He has not fallen since our last visit.  He reports increased dyskinesia only when he has increased stress.  Reports that he changed his DBS setting again back to where I had it previously (3.4).  He uses 2 flexeril at night for cramping.  He brings me articles to read on natural "supplements" and also asks me to look up a center in [place] that is owned by a "chiropractic neurologist" and has week long "camps" for patient to have Parkinson's disease that are supposed to help with healing of Parkinson's disease and not paid by insurance.  12/13/15 update:  The patient is following up today regarding his Parkinson's disease.  He is on carbidopa/levodopa 25/100, 4-5 tablets per day along with entacapone, 200 mg twice a day.  He also takes pramipexole, 1 mg 3 times per day and amantadine 100 mg 3 times a day for dyskinesia.  I reviewed records since last visit.  Fell at home on concrete on 11/30/15 and injured L calf.  Saw PCP few days later. Is better now.  States that he has learned that he just has to "stop" when he has a festinating gait.   No hallucinations.  No lightheadedness.    04/28/16 update:  He is on carbidopa/levodopa 25/100, 4-5 tablets per day (usually 4) along with entacapone, 200 mg twice a day.  He also takes pramipexole, 1 mg 3 times per day and amantadine 100 mg 3 times a day for dyskinesia.  He had started taking carbidopa/levodopa 50/200, one tablet in the AM as he thinks that this "gives me a 3 hour window before it is done."  He usually doesn't take it but sometimes he does.    This is odd dosing but he has rearranged his own medication to best suite him.  He has been physical therapy since our last visit.  He does think that it was helpful.  A U Step walker was recommended.  He isn't using it while out and  asks me what he can do for falls.  He does state that "my biggest falls are at home."  He does keep the walker next to him at bedtime because he finds that this is the most troublesome time when he gets up.  He finds that the top of his body gets ahead of the legs and that is why he falls.  Noting some trouble moving in confined spaces.  Reports that "I changed the DBS to 3.2 to see if it would help" but it didn't.  He is going to trainer 4-5 days a week.  Asks  if okay to hunt quail/fish if with someone.    09/01/16 update:  The patient returns today for follow-up.  He is on carbidopa/levodopa 25/100, 4 tablets throughout the day (and then 50% of the time he will add 1/2 extra tablet bid) and entacapone, 200 mg twice per day.  He also takes carbidopa/levodopa 50/200, one tablet in the morning prn.  He remains on Mirapex, 1 mg 3 times a day and amantadine 100 mg 3 times a day, for dyskinesia.  He did call me after our last visit and stated that he was falling, but admitted that he was not using his walker.  He was encouraged to start doing so.    He states today "I am not going to use that thing except at night."  States that he reduced his brain stimulator and "that didn't work so I went back up."  He has been going to rock steady boxing and thought that had helped.  Takes klonopin 0.5 mg for RBD and that is working well.  Needs a refill  PREVIOUS MEDICATIONS: Sinemet, Mirapex and azilect  ALLERGIES:   Allergies  Allergen Reactions  . Amoxicillin   . Celebrex [Celecoxib]   . Demerol Other (See Comments)    Hallucinations   . Penicillins     REACTION: RASH    CURRENT MEDICATIONS:  Current Outpatient Prescriptions on File Prior to Visit  Medication Sig Dispense Refill  . amantadine (SYMMETREL) 100 MG capsule TAKE ONE CAPSULE THREE TIMES A DAY 90 capsule 5  . carbidopa-levodopa (SINEMET CR) 50-200 MG tablet TAKE 1 TABLET BY MOUTH DAILY AS NEEDED 90 tablet 0  . carbidopa-levodopa (SINEMET IR) 25-100  MG tablet TAKE ONE TABLET Four TIMES A DAY, 1/2 tablet 1-2 times daily 150 tablet 5  . clonazePAM (KLONOPIN) 0.5 MG tablet TAKE 1-2 TABLETS BY MOUTH AT BEDTIME AS NEEDED 60 tablet 0  . cyclobenzaprine (FLEXERIL) 10 MG tablet TAKE 2 TABLETS BY MOUTH AT BEDTIME 60 tablet 5  . entacapone (COMTAN) 200 MG tablet TAKE ONE TABLET TWICE A DAY 60 tablet 5  . pramipexole (MIRAPEX) 1 MG tablet TAKE 1 TABLET 3 TIMES DAILY 90 tablet 5   No current facility-administered medications on file prior to visit.     PAST MEDICAL HISTORY:   Past Medical History:  Diagnosis Date  . Arthritis    left hip   . GERD (gastroesophageal reflux disease) Pre 2002   Mild  . History of kidney stones   . 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 Parkinson disease ([place]) 2004   Slowly progressive  . Ruptured appendix teens    PAST SURGICAL HISTORY:   Past Surgical History:  Procedure Laterality Date  . APPENDECTOMY    . CATARACT EXTRACTION W/ INTRAOCULAR LENS  IMPLANT, BILATERAL  2017  . DEEP BRAIN STIMULATOR PLACEMENT  8/14   L STN  . DOPPLER ECHOCARDIOGRAPHY  01/10/2009   LV NML Mild LVH EF 60-65% aortic sclerosis w/0 stenosis   . JOINT REPLACEMENT     left hip replacement 08/14/11/[facility]   . Neuro evaluation  12/19/2004   Casey Tucker, benign tremor  . TOTAL HIP REVISION  08/27/2011   Procedure: TOTAL HIP REVISION;  Surgeon: Mauri Pole, MD;  Location: [facility];  Service: Orthopedics;  Laterality: Left;    SOCIAL HISTORY:   Social History   Social History  . Marital status: Married    Spouse name: N/A  .  Number of children: 2  . Years of education: N/A   Occupational History  . Optometrist     medically retired   Social History Main Topics  . Smoking status: Never Smoker  . Smokeless tobacco: Never Used  . Alcohol use 3.5 oz/week    7 drink(s) per week     Comment: occassionally  . Drug use: No  . Sexual activity: Yes    Other Topics Concern  . Not on file   Social History Narrative   Married, lives with wife   2 daughters      Has living will   Wife has health care POA---then daughters   Would still accept CPR--but no prolonged artificial means (ventilator or tube feeds)    FAMILY HISTORY:   Family Status  Relation Status  . Mother Deceased at age 58   knee replacement  . Father Deceased at age 54   tremor  . Sister Product manager in [place], [place]  . Paternal Uncle     ROS:  A complete 10 system review of systems was obtained and was unremarkable apart from what is mentioned above.  PHYSICAL EXAMINATION:    VITALS:   Vitals:   09/01/16 1119  BP: 130/60  Pulse: 75  Weight: 170 lb (77.1 kg)  Height: 5\' 10"  (1.778 m)    GEN:  The patient appears stated age and is in NAD. HEENT:  Normocephalic, atraumatic.  The mucous membranes are moist. The superficial temporal arteries are without ropiness or tenderness. CV: RRR Lungs:  CTAB Neck: no bruits   Neurological examination:  Orientation: The patient is alert and oriented x3.  Cranial nerves: There is good facial symmetry.  There is minimal facial hypomimia.  Pupils are equal round and reactive to light bilaterally.  The speech is slightly dysarthric but fluent. Soft palate rises symmetrically and there is no tongue deviation. Hearing is intact to conversational tone. Motor: Strength is 5/5 in the bilateral upper and lower extremities.   Shoulder shrug is equal and symmetric.  There is no pronator drift.  Movement examination: Tone: There is normal tone in the bilateral upper extremities.  The tone in the lower extremities is normal.  Abnormal movements: There is significant dyskinesia in the R leg Coordination:  There is good RAMs today Gait and Station: The patient has no difficulty arising out of a deep-seated chair and he festinates.      LABS    Chemistry      Component Value Date/Time   NA 142 08/11/2016 1701   NA  139 08/15/2011 0535   K 4.5 08/11/2016 1701   K 3.8 08/15/2011 0535   CL 102 08/11/2016 1701   CL 101 08/15/2011 0535   CO2 26 08/11/2016 1701   CO2 29 08/15/2011 0535   BUN 18 08/11/2016 1701   BUN 12 08/15/2011 0535   CREATININE 0.68 (L) 08/11/2016 1701   CREATININE 0.73 08/15/2011 0535      Component Value Date/Time   CALCIUM 8.6 08/11/2016 1701   CALCIUM 7.6 (L) 08/15/2011 0535   ALKPHOS 86 08/11/2016 1701   AST 17 08/11/2016 1701   ALT 19 08/11/2016 1701   BILITOT 1.2 08/11/2016 1701     Lab Results  Component Value Date   TSH 2.82 03/16/2012    DBS programming was performed today which is described in more detail on a separate programming procedural notes.    ASSESSMENT/PLAN:  1. Parkinson's disease.    -The patient is status  post left STN DBS at [facility] on 03/14/2013.  -remain on the carbidopa/levodopa 25/100, which he has gone down on to 4-5 tablets/day and comtan 200 mg bid.  He is taking carbidopa/levodopa 50/200, 1 in the AM prn  -told him we could try to back down on mirapex.  Currently on 1.0 mg tid and try 0.5 mg tid.  Continue amantadine 100 mg tid, which is helping dyskinesia.  He has some dry mouth with the amantadine, but uses Biotene.  -I did talk to patient re: diagnosis.  I would have loved to seen him early on in the disease.  By definition, patients with PD have to have sx's on the "good" side of the body within 3 years of diagnosis, and he has never had sx's on the opposite side.  This has really called into question, for me, the diagnosis but I cannot deny that he has done much better post DBS (per his reports)  -DBS programming was performed today.    -no issue with hunting quail/fishing as long as someone with him.  Don't want him on uneven surface without someone right with him.    -talked about walker and told him he needs to use at all times.  Pt resistant to that idea  -doing well in rock steady boxing  -asked me about changing to the [employer]  scientific lead and not sure that this is valuable to him.  Told him he is not due for battery yet anyway but not sure that changing out lead intracranially would be beneficial.   2.  Constipation  -Was made worse by amantadine.  He is using the rancho recipe.  He is using Biotene for the dry mouth but thinks that is from the amantadine as well and it may be. 3.  RBD  -RF klonopin 3.  Much greater than 50% of the 25 minute visit (doesn't include DBS time) was in counseling and coordinating care, as above.

## 2016-09-01 ENCOUNTER — Encounter: Payer: Self-pay | Admitting: Neurology

## 2016-09-01 ENCOUNTER — Ambulatory Visit (INDEPENDENT_AMBULATORY_CARE_PROVIDER_SITE_OTHER): Payer: Medicare Other | Admitting: Neurology

## 2016-09-01 ENCOUNTER — Telehealth: Payer: Self-pay | Admitting: Neurology

## 2016-09-01 VITALS — BP 130/60 | HR 75 | Ht 70.0 in | Wt 170.0 lb

## 2016-09-01 DIAGNOSIS — Z9689 Presence of other specified functional implants: Secondary | ICD-10-CM | POA: Diagnosis not present

## 2016-09-01 DIAGNOSIS — G249 Dystonia, unspecified: Secondary | ICD-10-CM | POA: Diagnosis not present

## 2016-09-01 DIAGNOSIS — G20 Parkinson's disease: Secondary | ICD-10-CM | POA: Diagnosis not present

## 2016-09-01 NOTE — Telephone Encounter (Signed)
Rx called to pharmacy as instructed. 

## 2016-09-01 NOTE — Telephone Encounter (Signed)
-----   Message from [place], DO sent at 09/01/2016 11:49 AM EST ----- Needs klonopin RF

## 2016-09-01 NOTE — Telephone Encounter (Signed)
Went to call in refill and it has already been refilled from Dr. Damita Dunnings. I told Pharmacy we would wait to call refills until he is out of medication.

## 2016-09-01 NOTE — Telephone Encounter (Signed)
Please call in.  Thanks.   

## 2016-09-01 NOTE — Procedures (Signed)
DBS Programming was performed.    Total time spent reviewing programming was 20 minutes.  Device was confirmed to be on.  Soft start was confirmed to be on.  Impedences were checked and were within normal limits.   All impedances were well within normal limits and are recorded on a separate worksheet.  Battery was checked and was determined to be functioning normally and not near the end of life (2.79V).  Final settings were as follows:  Left brain electrode, Group A:     2-C+; Amplitude  3.5   V   ; Pulse width 60 microseconds;   Frequency   130   Hz.  Left brain electrode, group B:  1-C+, amplitude: 3.1 V, PW: 90, frequency 130  Group B was active at the time pt left the office.  Right brain electrode:     n/a

## 2016-09-01 NOTE — Patient Instructions (Signed)
Decrease mirapex to 1/2 tablet three times per day

## 2016-09-08 ENCOUNTER — Other Ambulatory Visit: Payer: Self-pay | Admitting: Internal Medicine

## 2016-09-08 NOTE — Telephone Encounter (Signed)
Approved: #60 x 0 

## 2016-09-08 NOTE — Telephone Encounter (Signed)
Verbal refill given to Amy at the pharmacy 

## 2016-09-08 NOTE — Telephone Encounter (Signed)
Last filled 07-02-16 #60 Last OV 05-05-16 Next OV 05-11-17

## 2016-09-25 ENCOUNTER — Telehealth: Payer: Self-pay | Admitting: Neurology

## 2016-09-25 NOTE — Telephone Encounter (Signed)
Patient needs to talk to someone please call [phone]

## 2016-09-25 NOTE — Telephone Encounter (Signed)
Spoke with patient's wife. He is not available - he went to boxing class and this lasts 90 minutes. Made her aware we close at 4:30 pm. She did not know why he called and he will call back Monday if he needs [place].

## 2016-09-29 ENCOUNTER — Telehealth: Payer: Self-pay | Admitting: Neurology

## 2016-09-29 NOTE — Telephone Encounter (Signed)
Patient states he went to a Parkinson's Support group last week and was told that his Medtronic DBS can now be modified to a 4 lead DBS and he could get 10% improvement. He is interested in this and wants to know your thoughts. Please advise.

## 2016-09-29 NOTE — Telephone Encounter (Signed)
He already has a lead with 4 contacts on the end of it.  I suspect that he actually saw the [employer] speaking (I heard that they spoke to the support group in [place]) and their lead has 8 contacts BUT that doesn't make it better.  Given his clinical situation, I would not recommend revising any lead right now.

## 2016-09-29 NOTE — Telephone Encounter (Signed)
Patient wants to talk to someone about DBS please call [phone]

## 2016-09-29 NOTE — Telephone Encounter (Signed)
Patient made aware.

## 2016-09-30 DIAGNOSIS — L03115 Cellulitis of right lower limb: Secondary | ICD-10-CM | POA: Diagnosis not present

## 2016-10-02 DIAGNOSIS — M19071 Primary osteoarthritis, right ankle and foot: Secondary | ICD-10-CM | POA: Diagnosis not present

## 2016-10-27 DIAGNOSIS — S8002XA Contusion of left knee, initial encounter: Secondary | ICD-10-CM | POA: Diagnosis not present

## 2016-11-02 ENCOUNTER — Other Ambulatory Visit: Payer: Self-pay | Admitting: Neurology

## 2016-11-16 DIAGNOSIS — Z961 Presence of intraocular lens: Secondary | ICD-10-CM | POA: Diagnosis not present

## 2016-11-16 DIAGNOSIS — H353131 Nonexudative age-related macular degeneration, bilateral, early dry stage: Secondary | ICD-10-CM | POA: Diagnosis not present

## 2016-11-16 DIAGNOSIS — H35371 Puckering of macula, right eye: Secondary | ICD-10-CM | POA: Diagnosis not present

## 2016-11-23 ENCOUNTER — Telehealth: Payer: Self-pay | Admitting: Neurology

## 2016-11-23 ENCOUNTER — Other Ambulatory Visit
Admission: RE | Admit: 2016-11-23 | Discharge: 2016-11-23 | Disposition: A | Discharge status: Home / Self Care | Payer: Medicare Other | Source: Ambulatory Visit | Attending: Specialist | Admitting: Specialist

## 2016-11-23 DIAGNOSIS — R2241 Localized swelling, mass and lump, right lower limb: Secondary | ICD-10-CM | POA: Insufficient documentation

## 2016-11-23 NOTE — Telephone Encounter (Signed)
Spoke with patient and discussed.

## 2016-11-23 NOTE — Telephone Encounter (Signed)
Patient has questions about his DBS system  He wants to know if you can add electrodes to his system or can you a 6 electrodes system on your first surgery

## 2016-11-23 NOTE — Telephone Encounter (Signed)
Patient is returning your call. Please call back at [phone]

## 2016-11-23 NOTE — Telephone Encounter (Signed)
LMOM making patient aware.  

## 2016-11-23 NOTE — Telephone Encounter (Signed)
He asked me this last time and he is referring to the [place] system.  I don't see any advantage to changing his DBS system out in his case at all.

## 2016-11-26 LAB — BODY FLUID CULTURE

## 2016-12-04 ENCOUNTER — Other Ambulatory Visit: Payer: Self-pay | Admitting: Internal Medicine

## 2016-12-04 ENCOUNTER — Other Ambulatory Visit: Payer: Self-pay | Admitting: Neurology

## 2016-12-04 NOTE — Telephone Encounter (Signed)
Last filled 11-02-16 #60 Last OV 05-05-16 Next OV 05-11-17  Forward to Dr Diona Browner in Dr Alla German absence

## 2016-12-07 NOTE — Telephone Encounter (Signed)
Verbal refill given to Vivien Rota at pharmacy

## 2016-12-22 ENCOUNTER — Ambulatory Visit: Payer: Medicare Other | Admitting: Neurology

## 2016-12-28 ENCOUNTER — Ambulatory Visit (INDEPENDENT_AMBULATORY_CARE_PROVIDER_SITE_OTHER): Payer: Medicare Other | Admitting: Neurology

## 2016-12-28 ENCOUNTER — Encounter: Payer: Self-pay | Admitting: Neurology

## 2016-12-28 VITALS — BP 146/58 | HR 72 | Ht 73.0 in | Wt 163.0 lb

## 2016-12-28 DIAGNOSIS — Z9689 Presence of other specified functional implants: Secondary | ICD-10-CM

## 2016-12-28 DIAGNOSIS — G249 Dystonia, unspecified: Secondary | ICD-10-CM

## 2016-12-28 DIAGNOSIS — G20 Parkinson's disease: Secondary | ICD-10-CM

## 2016-12-28 NOTE — Procedures (Signed)
DBS Programming was performed.    Total time spent reviewing programming was 20 minutes.  Device was confirmed to be on.  Soft start was confirmed to be on.  Impedences were checked and were within normal limits.   All impedances were well within normal limits and are recorded on a separate worksheet.  Battery was checked and was determined to be functioning normally and not near the end of life (2.69V).  Final settings were as follows:  Left brain electrode, Group A:     2-C+; Amplitude  3.5   V   ; Pulse width 60 microseconds;   Frequency   130   Hz.  Left brain electrode, group B:  1-C+, amplitude: 3.2 V, PW: 90, frequency 130  Group B was active at the time pt left the office.  Right brain electrode:     n/a

## 2016-12-28 NOTE — Progress Notes (Signed)
1`   Casey Tucker was seen today in the movement disorders clinic for neurologic consultation at the request of [name], MD.  The consultation is for the evaluation of PD.  The records that were made available to me were reviewed.  Pt is accompanied by his wife who supplements the history.   Pt reports first sx was in 2006.  Pt is R hand dominant.  Pt states that his next door neighbor (Network engineer) noted that he wasn't swinging his arm and thought that he had a stroke. He subsequently saw [name] in about 2006 and he was dx with PD.  He was started on mirapex and azilect.  It helped the overall stiffness.  Tremor developed in the R arm around the same time as the diagnosis.  Levodopa was started in approximately 2009-2010.  He also started an exercise program and he believes that has made a huge difference.  He began to have some motor fluctuations and saw [name] and had his surgery done in 02/2013.  It was a unilateral implant, placed into the L STN.  He has noted that he is sleeping much better since surgery.  No tremor.  [name] programmed the DBS one week post op.  He f/u with the NP at [facility], and he now has an A and a B setting.  He hates the A setting (doesn't walk well) and he "cranked" his own B setting up to 280 (assuming frequency) and this helped walking some but he still has problems with balance and freezing.  He is on mirapex, 1 mg tid.  He is on carbidopa/levodopa 25/100, 7:30am/11am/2pm/6pm.    11/15/13:  Pt returns today for follow up.  Overall, he has been doing well.  He is off of medication this AM for programming.  His wife reports that he has done better on his new setting.  Seems to be walking better with less dyskinesia.  Until this morning, he was still on the carbidopa/levodopa 25/100, 1 tablet 4 times per day, Azilect 1 mg daily and Mirapex 1 mg 3 times per day.  He is exercising faithfully.  He has not had any falls.  He has been shuffling a little more this  morning, since he is off of medication.  11/21/13 update:  The patient is returning for followup, earlier than expected.  The patient reports that he really had not been doing as well over the weekend.  He did not feel like he was walking as well. He felt like the setting "wore" off 30 mins before the next levodopa dosage.  His wife wonders if he wasn't doing as well because he was at the beach this past weekend and there were more stairs to negotiate.  No falls.  No hallucinations   He did decrease his carbidopa/levodopa 25/100, one tablet 3 times per day from one tablet 4 times per day.  He is still on Mirapex 1 mg 3 times per day and Azilect 1 mg daily.  12/20/13 update:  The patient returns today for followup, accompanied by his wife and daughter who supplements the history.  The patient is currently on Mirapex, 1 mg 3 times a day, Azilect 1 mg daily and carbidopa/levodopa 25/100 at 7:30 AM/11 AM/2 PM and 6:30 PM.  Overall, the patient states that he is doing much better on his current setting.  It is not seeming to "wear off."  He is having some difficulty in the middle of the night when he  gets up and sometimes has to use a cane, but otherwise is doing well.  No falls.  On one occasion, he did try to get up in the morning and go and take a walk and was able to walk a few miles without taking his medication, but then he needed it when he got home.  He has noticed some dyskinesia, but it has not been particularly bothersome.  He would like to decrease dyskinesia, if able.  03/20/14 update:  Pt is f//u today.  Overall doing well.  He tried to decrease his carbidopa/levodopa 25/100 from one tablet 4 times per day to 1/0.5/0.5/1, but ended up going back up to one tablet 4 times per day.  He finds that he is due for his second dose at 11 AM, but by 10:30 AM he is shuffling, and he thinks it is primarily the right foot.  When he goes hiking, he states that he takes an extra half tablet at 10:30 AM and as well and  often takes another half tablet in the afternoon.  He finds that protein interferes with the levodopa dosing.  He continues to take 2 Flexeril at night.  He does not know if it helps, but has not tried to stop that since the DBS surgery.  He finds that the only time he has dyskinesia is at the end of the dose.  He asks me if we can slightly increase the DBS stimulation.  He continues to exercise.  No falls.  No hallucinations.  05/18/14 update: The patient is following up today, accompanied by his wife who supplements the history.  I tried to slightly decrease the levodopa, but the patient is was not able to do that.  He remains on carbidopa/levodopa 25/100, 1 tablet 4 times per day.  He is also on Mirapex 1 mg 3 times per day along with Azilect 1 mg daily. C/o some "slight" freezing episodes and med wearing off 1/2 hr before due but at the same time having frequent dyskinesia of the R leg.  No falls.  Staying very active.  No hallucinations.  C/o cost of azilect and asks about its benefit.    08/08/14 update:  Overall, the patient is doing well.  He has not taken medication since 6:30 this morning, and wanted me to see him off of medication.  He states that he has some "spotty freezing."  He asks about potentially taking metformin to help him.  He also asks about levodopa nasal spray.  He asks about several other experimental treatments.  He has occasional dyskinesia in the right foot but it is not bothersome to him.  Overall, he feels slightly slower than he used to.  He states that he generally is supposed to take his levodopa at 11 AM but he needs it by 10:30.  If he is very active he will need to take a few extra half dosages of levodopa throughout the day.  Generally, however, he takes 4 tablets of carbidopa/levodopa 25/100 throughout the day in addition to entacapone 200 mg twice a day and Mirapex 1 mg 3 times a day.  He is off of Azilect and wants to know if he should go back on it.  He noticed no  difference when discontinuing it.  He is off of Flexeril as well as noticed no trouble with that.  10/10/14 update:  Overall, the patient is doing well.  He is on carbidopa/levodopa 25/100 4 times per day.  He takes entacapone 200 mg with  the first and third dose of the levodopa.  He really does not notice that it helps and states that he still feels "jerky" 30 minutes before his next dosage is due.  He will often just take the levodopa and then.  He is on Mirapex 1 mg 3 times a day.  I added carbidopa/levodopa 50/200 CR at night because of cramping and the patient states that it helps but he rarely takes it because he thinks the dyskinesia that worse when he did it.  He states that he uses it if he really needs to "get up and go."  He does use Flexeril at night for cramping and thinks it helped as much as the carbidopa/levodopa 50/200 CR.  He did turn down the DBS device somewhat since last visit because of dyskinesia.  He states that he takes Azilect "off and on" because he was told by a previous neurologist that it was neuroprotective.  He asks me about isradapine.  02/12/15 update:  The patient returns today for follow-up.  I have communicated with him several times since last visit.  He called with complaints of freezing or before his next dose and we ended up slightly increasing his levodopa so that he is taking 1-1/2 tablets in the morning, one tablet at his next dose, 1-1/2 tablets in the mid afternoon and then 1 tablet in the later evening.  This helped but he states that sometimes he will take 1 in the AM and the 1/2 an hour or 2 later.  He still only takes 3-4 a day.  He takes entacapone with every other dose of the levodopa.  He is on pramipexole 1 mg 3 times per day.  Dyskinesia has been problematic despite the fact he complains about freezing and we ended up adding amantadine, 100 mg 3 times a day for that. It helped that but caused constipation and dry mouth.   He had 2 falls since last visit, one  of which was at the outer edge of my office when he came to bring me something.  He quickly turned around while carrying something and fell to his knee.  He did not get hurt.   He states that if he gets in a crowded space his feet will just get tangled together.   He does know that if he gets into "shuffle mode" he needs to stop and "reset."  He states overall shuffling is much better.  He asked me about several studies since our last visit, but these are not available to him because of the fact he has already had DBS.  He asks me again about nilotinib study.  05/15/15 update:  The patient is following up today regarding his Parkinson's disease.  States that he has been "fantastic."  He did have a postoperative MRI done since our last visit.  This is first he has ever had since his surgery.  [facility] plane is approximately at contact number 2 on the lead.  We are currently actively using contact 1.  We have used contact 2 in the past successfully.  He is currently on carbidopa/levodopa 25/100; he has gone down a bit on this to about 4 and sometimes 4.5 tablets a day.  Is on Comtan 200 mg twice a day, Mirapex 1 mg 3 times a day and amantadine 100 mg 3 times a day for dyskinesia.  If he gets nervous, his legs will "twitch."  He is exercising on the bike in addition to doing physical labor.  Dropped his own programming to 3.2 and thinks that it has helped.  Did have 1-2 falls when "I get in a hurry."  Never got hurt.  Notices more trouble when he is at end of dosage.  08/15/15 update:  The patient is following up today regarding his Parkinson's disease.  He is on carbidopa/levodopa 25/100, 4- tablets per day (and then will take 2 extra 1/2 tablets as needed) along with entacapone, 200 mg twice a day.  He also takes pramipexole, 1 mg 3 times per day and amantadine 100 mg 3 times a day for dyskinesia.   Amantadine is causing dry mouth and he is using biotene to help that.   He has called several times since last  visit asking me about medical marijuana and its place in the treatment of Parkinson's disease.  I have talked to him about the position statement by the [employer].  He has not fallen since our last visit.  He reports increased dyskinesia only when he has increased stress.  Reports that he changed his DBS setting again back to where I had it previously (3.4).  He uses 2 flexeril at night for cramping.  He brings me articles to read on natural "supplements" and also asks me to look up a center in [place] that is owned by a "chiropractic neurologist" and has week long "camps" for patient to have Parkinson's disease that are supposed to help with healing of Parkinson's disease and not paid by insurance.  12/13/15 update:  The patient is following up today regarding his Parkinson's disease.  He is on carbidopa/levodopa 25/100, 4-5 tablets per day along with entacapone, 200 mg twice a day.  He also takes pramipexole, 1 mg 3 times per day and amantadine 100 mg 3 times a day for dyskinesia.  I reviewed records since last visit.  Fell at home on concrete on 11/30/15 and injured L calf.  Saw PCP few days later. Is better now.  States that he has learned that he just has to "stop" when he has a festinating gait.   No hallucinations.  No lightheadedness.    04/28/16 update:  He is on carbidopa/levodopa 25/100, 4-5 tablets per day (usually 4) along with entacapone, 200 mg twice a day.  He also takes pramipexole, 1 mg 3 times per day and amantadine 100 mg 3 times a day for dyskinesia.  He had started taking carbidopa/levodopa 50/200, one tablet in the AM as he thinks that this "gives me a 3 hour window before it is done."  He usually doesn't take it but sometimes he does.    This is odd dosing but he has rearranged his own medication to best suite him.  He has been physical therapy since our last visit.  He does think that it was helpful.  A U Step walker was recommended.  He isn't using it while out and  asks me what he can do for falls.  He does state that "my biggest falls are at home."  He does keep the walker next to him at bedtime because he finds that this is the most troublesome time when he gets up.  He finds that the top of his body gets ahead of the legs and that is why he falls.  Noting some trouble moving in confined spaces.  Reports that "I changed the DBS to 3.2 to see if it would help" but it didn't.  He is going to trainer 4-5 days a week.  Asks  if okay to hunt quail/fish if with someone.    09/01/16 update:  The patient returns today for follow-up.  He is on carbidopa/levodopa 25/100, 4 tablets throughout the day (and then 50% of the time he will add 1/2 extra tablet bid) and entacapone, 200 mg twice per day.  He also takes carbidopa/levodopa 50/200, one tablet in the morning prn.  He remains on Mirapex, 1 mg 3 times a day and amantadine 100 mg 3 times a day, for dyskinesia.  He did call me after our last visit and stated that he was falling, but admitted that he was not using his walker.  He was encouraged to start doing so.    He states today "I am not going to use that thing except at night."  States that he reduced his brain stimulator and "that didn't work so I went back up."  He has been going to rock steady boxing and thought that had helped.  Takes klonopin 0.5 mg for RBD and that is working well.  Needs a refill  12/28/16 update:  The patient returns today for follow-up, accompanied by his wife and daughter who supplement the history.  He is on carbidopa/levodopa 25/100, 4 tablets throughout the day (and then 50% of the time he will add 1/2 extra tablet bid) and entacapone, 200 mg twice per day.  He also takes carbidopa/levodopa 50/200, one tablet at night.  He remains on Mirapex, 1 mg 3 times a day (was going to try and drop that but had trouble cutting that one) and amantadine 100 mg 3 times a day, for dyskinesia.  He is doing boxing 3 days a week.  He asks me again about the [employer]  device.  States that he increased the DBS and when asked why he stated that he was hoping it would help his freezing/walking/balance.  PREVIOUS MEDICATIONS: Sinemet, Mirapex and azilect  ALLERGIES:   Allergies  Allergen Reactions  . Amoxicillin   . Celebrex [Celecoxib]   . Demerol Other (See Comments)    Hallucinations   . Penicillins     REACTION: RASH    CURRENT MEDICATIONS:  Current Outpatient Prescriptions on File Prior to Visit  Medication Sig Dispense Refill  . amantadine (SYMMETREL) 100 MG capsule TAKE ONE CAPSULE THREE TIMES A DAY 90 capsule 5  . carbidopa-levodopa (SINEMET CR) 50-200 MG tablet TAKE 1 TABLET BY MOUTH DAILY AS NEEDED (Patient taking differently: TAKE 1 TABLET BY MOUTH DAILY AT BEDTIME) 90 tablet 0  . carbidopa-levodopa (SINEMET IR) 25-100 MG tablet TAKE 1 TABLET BY MOUTH 4 TIMES DAILY AND1/2 TABLET 1-2 TIMES DAILY 150 tablet 5  . clonazePAM (KLONOPIN) 0.5 MG tablet TAKE 1-2 TABLETS BY MOUTH AT BEDTIME AS NEEDED 60 tablet 0  . cyclobenzaprine (FLEXERIL) 10 MG tablet TAKE 2 TABLETS BY MOUTH AT BEDTIME 60 tablet 5  . entacapone (COMTAN) 200 MG tablet TAKE 1 TABLET BY MOUTH TWO TIMES DAILY 60 tablet 5  . pramipexole (MIRAPEX) 1 MG tablet TAKE 1 TABLET BY MOUTH 3 TIMES DAILY 90 tablet 5   No current facility-administered medications on file prior to visit.     PAST MEDICAL HISTORY:   Past Medical History:  Diagnosis Date  . Arthritis    left hip   . GERD (gastroesophageal reflux disease) Pre 2002   Mild  . History of kidney stones   . 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 Parkinson disease ([place])  2004   Slowly progressive  . Ruptured appendix teens    PAST SURGICAL HISTORY:   Past Surgical History:  Procedure Laterality Date  . APPENDECTOMY    . CATARACT EXTRACTION W/ INTRAOCULAR LENS  IMPLANT, BILATERAL  2017  . DEEP BRAIN STIMULATOR PLACEMENT  8/14   L STN  . DOPPLER  ECHOCARDIOGRAPHY  01/10/2009   LV NML Mild LVH EF 60-65% aortic sclerosis w/0 stenosis   . JOINT REPLACEMENT     left hip replacement 08/14/11/[place]   . Neuro evaluation  12/19/2004   [name], benign tremor  . TOTAL HIP REVISION  08/27/2011   Procedure: TOTAL HIP REVISION;  Surgeon: Mauri Pole, MD;  Location: [facility];  Service: Orthopedics;  Laterality: Left;    SOCIAL HISTORY:   Social History   Social History  . Marital status: Married    Spouse name: N/A  . Number of children: 2  . Years of education: N/A   Occupational History  . Optometrist     medically retired   Social History Main Topics  . Smoking status: Never Smoker  . Smokeless tobacco: Never Used  . Alcohol use 3.5 oz/week    7 drink(s) per week     Comment: occassionally  . Drug use: No  . Sexual activity: Yes   Other Topics Concern  . Not on file   Social History Narrative   Married, lives with wife   2 daughters      Has living will   Wife has health care POA---then daughters   Would still accept CPR--but no prolonged artificial means (ventilator or tube feeds)    FAMILY HISTORY:   Family Status  Relation Status  . Mother Deceased at age 55       knee replacement  . Father Deceased at age 34       tremor  . Sister Stage manager in [place], [place]  . Annamarie Major (Not Specified)    ROS:  A complete 10 system review of systems was obtained and was unremarkable apart from what is mentioned above.  PHYSICAL EXAMINATION:    VITALS:   Vitals:   12/28/16 1008  BP: (!) 146/58  Pulse: 72  SpO2: 92%  Weight: 163 lb (73.9 kg)  Height: 6\' 1"  (1.854 m)   Wt Readings from Last 3 Encounters:  12/28/16 163 lb (73.9 kg)  09/01/16 170 lb (77.1 kg)  05/05/16 162 lb (73.5 kg)     GEN:  The patient appears stated age and is in NAD. HEENT:  Normocephalic, atraumatic.  The mucous membranes are moist. The superficial temporal arteries are without ropiness or tenderness. CV: RRR Lungs:   CTAB Neck: no bruits   Neurological examination:  Orientation: The patient is alert and oriented x3.  Cranial nerves: There is good facial symmetry.  There is minimal facial hypomimia.  Pupils are equal round and reactive to light bilaterally.  The speech is slightly dysarthric but fluent. Soft palate rises symmetrically and there is no tongue deviation. Hearing is intact to conversational tone. Motor: Strength is 5/5 in the bilateral upper and lower extremities.   Shoulder shrug is equal and symmetric.  There is no pronator drift.  Movement examination: Tone: There is normal tone in the bilateral upper extremities.  The tone in the lower extremities is normal.  Abnormal movements: There is significant dyskinesia in the R leg Coordination:  There is decreased RAM's with any form of RAMS, including alternating  supination and pronation of the forearm, hand opening and closing, finger taps, heel taps and toe taps, R more than L Gait and Station: The patient has no difficulty arising out of a deep-seated chair and he festinates.    He freezes in the doorway.  He freezes in the turns.    LABS    Chemistry      Component Value Date/Time   NA 142 08/11/2016 1701   NA 139 08/15/2011 0535   K 4.5 08/11/2016 1701   K 3.8 08/15/2011 0535   CL 102 08/11/2016 1701   CL 101 08/15/2011 0535   CO2 26 08/11/2016 1701   CO2 29 08/15/2011 0535   BUN 18 08/11/2016 1701   BUN 12 08/15/2011 0535   CREATININE 0.68 (L) 08/11/2016 1701   CREATININE 0.73 08/15/2011 0535      Component Value Date/Time   CALCIUM 8.6 08/11/2016 1701   CALCIUM 7.6 (L) 08/15/2011 0535   ALKPHOS 86 08/11/2016 1701   AST 17 08/11/2016 1701   ALT 19 08/11/2016 1701   BILITOT 1.2 08/11/2016 1701     Lab Results  Component Value Date   TSH 2.82 03/16/2012    DBS programming was performed today which is described in more detail on a separate programming procedural notes.    ASSESSMENT/PLAN:  1. Parkinson's disease.      -The patient is status post left STN DBS at [facility] on 03/14/2013.  -remain on the carbidopa/levodopa 25/100, which he has gone down on to 4-5 tablets/day and comtan 200 mg bid.  He is taking carbidopa/levodopa 50/200, 1 in the AM prn  -told him we could try to back down on mirapex.  Currently on 1.0 mg tid and try 0.5 mg tid.  Continue amantadine 100 mg tid, which is helping dyskinesia.  He has some dry mouth with the amantadine, but uses Biotene.  -I did talk to patient re: diagnosis.  I would have loved to seen him early on in the disease.  By definition, patients with PD have to have sx's on the "good" side of the body within 3 years of diagnosis, and he has never had sx's on the opposite side.  This has really called into question, for me, the diagnosis but I cannot deny that he has done much better post DBS (per his reports)  -DBS programming was performed today.  Pt turned up stim but explained that this was only going to make dyskinesia worse and family asked me to turn that down.  -talked about walker and told him he needs to use at all times.  Pt resistant to that idea.  Discussed that idea again today as this daughter hasn't been present previously  -doing well in rock steady boxing  -asked me again with patient/daughter present about changing to the [employer] lead and not sure that this is valuable to him.   2.  Constipation  -Was made worse by amantadine.  He is using the rancho recipe.  He is using Biotene for the dry mouth but thinks that is from the amantadine as well and it may be. 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  Much greater than 50% of the 25 minute visit (doesn't include DBS time) was in counseling and coordinating care, as above.

## 2016-12-31 ENCOUNTER — Telehealth: Payer: Self-pay | Admitting: Neurology

## 2016-12-31 NOTE — Telephone Encounter (Signed)
Spoke with patient and made him aware Mirapex reduced to 1/2 tablet three times daily. He will just split the tablets. No need for new RX.

## 2016-12-31 NOTE — Telephone Encounter (Signed)
PT called and said the pharmacy has not received a new prescription for Mirapex with the dosage change

## 2017-01-04 ENCOUNTER — Other Ambulatory Visit: Payer: Self-pay | Admitting: Neurology

## 2017-01-25 ENCOUNTER — Other Ambulatory Visit: Payer: Self-pay | Admitting: Neurology

## 2017-01-26 ENCOUNTER — Other Ambulatory Visit: Payer: Self-pay | Admitting: Internal Medicine

## 2017-01-26 NOTE — Telephone Encounter (Signed)
Verbal refill given to Toni at the pharmacy 

## 2017-01-26 NOTE — Telephone Encounter (Signed)
Approved: #60 x 0 

## 2017-01-26 NOTE — Telephone Encounter (Signed)
Last filled 12-07-16 #60 Last OV 05-05-16 Next OV 05-11-17

## 2017-01-26 NOTE — Telephone Encounter (Signed)
Pt request refill clonazepam to [facility]. Pt going out of town on 01/27/17 and request refill done today. Last refilled # 60 on 12/04/16; pt last annual 05/05/16.

## 2017-02-01 DIAGNOSIS — D225 Melanocytic nevi of trunk: Secondary | ICD-10-CM | POA: Diagnosis not present

## 2017-02-01 DIAGNOSIS — Z872 Personal history of diseases of the skin and subcutaneous tissue: Secondary | ICD-10-CM | POA: Diagnosis not present

## 2017-02-01 DIAGNOSIS — X32XXXA Exposure to sunlight, initial encounter: Secondary | ICD-10-CM | POA: Diagnosis not present

## 2017-02-01 DIAGNOSIS — Z85828 Personal history of other malignant neoplasm of skin: Secondary | ICD-10-CM | POA: Diagnosis not present

## 2017-02-01 DIAGNOSIS — Z09 Encounter for follow-up examination after completed treatment for conditions other than malignant neoplasm: Secondary | ICD-10-CM | POA: Diagnosis not present

## 2017-02-01 DIAGNOSIS — L570 Actinic keratosis: Secondary | ICD-10-CM | POA: Diagnosis not present

## 2017-02-15 ENCOUNTER — Telehealth: Payer: Self-pay | Admitting: Neurology

## 2017-02-15 NOTE — Telephone Encounter (Signed)
Patient called to ask can he use a Pump during the "off" period. He said you would know what he was talking about.  Please Advise. Thanks

## 2017-02-16 NOTE — Telephone Encounter (Signed)
Left message on machine for patient to call back.

## 2017-02-23 NOTE — Progress Notes (Signed)
1`   Casey Tucker was seen today in the movement disorders clinic for neurologic consultation at the request of [name], MD.  The consultation is for the evaluation of PD.  The records that were made available to me were reviewed.  Pt is accompanied by his wife who supplements the history.   Pt reports first sx was in 2006.  Pt is R hand dominant.  Pt states that his next door neighbor (Network engineer) noted that he wasn't swinging his arm and thought that he had a stroke. He subsequently saw [name] in about 2006 and he was dx with PD.  He was started on mirapex and azilect.  It helped the overall stiffness.  Tremor developed in the R arm around the same time as the diagnosis.  Levodopa was started in approximately 2009-2010.  He also started an exercise program and he believes that has made a huge difference.  He began to have some motor fluctuations and saw [name] and had his surgery done in 02/2013.  It was a unilateral implant, placed into the L STN.  He has noted that he is sleeping much better since surgery.  No tremor.  [name] programmed the DBS one week post op.  He f/u with the NP at [facility], and he now has an A and a B setting.  He hates the A setting (doesn't walk well) and he "cranked" his own B setting up to 280 (assuming frequency) and this helped walking some but he still has problems with balance and freezing.  He is on mirapex, 1 mg tid.  He is on carbidopa/levodopa 25/100, 7:30am/11am/2pm/6pm.    11/15/13:  Pt returns today for follow up.  Overall, he has been doing well.  He is off of medication this AM for programming.  His wife reports that he has done better on his new setting.  Seems to be walking better with less dyskinesia.  Until this morning, he was still on the carbidopa/levodopa 25/100, 1 tablet 4 times per day, Azilect 1 mg daily and Mirapex 1 mg 3 times per day.  He is exercising faithfully.  He has not had any falls.  He has been shuffling a little more this  morning, since he is off of medication.  11/21/13 update:  The patient is returning for followup, earlier than expected.  The patient reports that he really had not been doing as well over the weekend.  He did not feel like he was walking as well. He felt like the setting "wore" off 30 mins before the next levodopa dosage.  His wife wonders if he wasn't doing as well because he was at the beach this past weekend and there were more stairs to negotiate.  No falls.  No hallucinations   He did decrease his carbidopa/levodopa 25/100, one tablet 3 times per day from one tablet 4 times per day.  He is still on Mirapex 1 mg 3 times per day and Azilect 1 mg daily.  12/20/13 update:  The patient returns today for followup, accompanied by his wife and daughter who supplements the history.  The patient is currently on Mirapex, 1 mg 3 times a day, Azilect 1 mg daily and carbidopa/levodopa 25/100 at 7:30 AM/11 AM/2 PM and 6:30 PM.  Overall, the patient states that he is doing much better on his current setting.  It is not seeming to "wear off."  He is having some difficulty in the middle of the night when he  gets up and sometimes has to use a cane, but otherwise is doing well.  No falls.  On one occasion, he did try to get up in the morning and go and take a walk and was able to walk a few miles without taking his medication, but then he needed it when he got home.  He has noticed some dyskinesia, but it has not been particularly bothersome.  He would like to decrease dyskinesia, if able.  03/20/14 update:  Pt is f//u today.  Overall doing well.  He tried to decrease his carbidopa/levodopa 25/100 from one tablet 4 times per day to 1/0.5/0.5/1, but ended up going back up to one tablet 4 times per day.  He finds that he is due for his second dose at 11 AM, but by 10:30 AM he is shuffling, and he thinks it is primarily the right foot.  When he goes hiking, he states that he takes an extra half tablet at 10:30 AM and as well and  often takes another half tablet in the afternoon.  He finds that protein interferes with the levodopa dosing.  He continues to take 2 Flexeril at night.  He does not know if it helps, but has not tried to stop that since the DBS surgery.  He finds that the only time he has dyskinesia is at the end of the dose.  He asks me if we can slightly increase the DBS stimulation.  He continues to exercise.  No falls.  No hallucinations.  05/18/14 update: The patient is following up today, accompanied by his wife who supplements the history.  I tried to slightly decrease the levodopa, but the patient is was not able to do that.  He remains on carbidopa/levodopa 25/100, 1 tablet 4 times per day.  He is also on Mirapex 1 mg 3 times per day along with Azilect 1 mg daily. C/o some "slight" freezing episodes and med wearing off 1/2 hr before due but at the same time having frequent dyskinesia of the R leg.  No falls.  Staying very active.  No hallucinations.  C/o cost of azilect and asks about its benefit.    08/08/14 update:  Overall, the patient is doing well.  He has not taken medication since 6:30 this morning, and wanted me to see him off of medication.  He states that he has some "spotty freezing."  He asks about potentially taking metformin to help him.  He also asks about levodopa nasal spray.  He asks about several other experimental treatments.  He has occasional dyskinesia in the right foot but it is not bothersome to him.  Overall, he feels slightly slower than he used to.  He states that he generally is supposed to take his levodopa at 11 AM but he needs it by 10:30.  If he is very active he will need to take a few extra half dosages of levodopa throughout the day.  Generally, however, he takes 4 tablets of carbidopa/levodopa 25/100 throughout the day in addition to entacapone 200 mg twice a day and Mirapex 1 mg 3 times a day.  He is off of Azilect and wants to know if he should go back on it.  He noticed no  difference when discontinuing it.  He is off of Flexeril as well as noticed no trouble with that.  10/10/14 update:  Overall, the patient is doing well.  He is on carbidopa/levodopa 25/100 4 times per day.  He takes entacapone 200 mg with  the first and third dose of the levodopa.  He really does not notice that it helps and states that he still feels "jerky" 30 minutes before his next dosage is due.  He will often just take the levodopa and then.  He is on Mirapex 1 mg 3 times a day.  I added carbidopa/levodopa 50/200 CR at night because of cramping and the patient states that it helps but he rarely takes it because he thinks the dyskinesia that worse when he did it.  He states that he uses it if he really needs to "get up and go."  He does use Flexeril at night for cramping and thinks it helped as much as the carbidopa/levodopa 50/200 CR.  He did turn down the DBS device somewhat since last visit because of dyskinesia.  He states that he takes Azilect "off and on" because he was told by a previous neurologist that it was neuroprotective.  He asks me about isradapine.  02/12/15 update:  The patient returns today for follow-up.  I have communicated with him several times since last visit.  He called with complaints of freezing or before his next dose and we ended up slightly increasing his levodopa so that he is taking 1-1/2 tablets in the morning, one tablet at his next dose, 1-1/2 tablets in the mid afternoon and then 1 tablet in the later evening.  This helped but he states that sometimes he will take 1 in the AM and the 1/2 an hour or 2 later.  He still only takes 3-4 a day.  He takes entacapone with every other dose of the levodopa.  He is on pramipexole 1 mg 3 times per day.  Dyskinesia has been problematic despite the fact he complains about freezing and we ended up adding amantadine, 100 mg 3 times a day for that. It helped that but caused constipation and dry mouth.   He had 2 falls since last visit, one  of which was at the outer edge of my office when he came to bring me something.  He quickly turned around while carrying something and fell to his knee.  He did not get hurt.   He states that if he gets in a crowded space his feet will just get tangled together.   He does know that if he gets into "shuffle mode" he needs to stop and "reset."  He states overall shuffling is much better.  He asked me about several studies since our last visit, but these are not available to him because of the fact he has already had DBS.  He asks me again about nilotinib study.  05/15/15 update:  The patient is following up today regarding his Parkinson's disease.  States that he has been "fantastic."  He did have a postoperative MRI done since our last visit.  This is first he has ever had since his surgery.  [facility] plane is approximately at contact number 2 on the lead.  We are currently actively using contact 1.  We have used contact 2 in the past successfully.  He is currently on carbidopa/levodopa 25/100; he has gone down a bit on this to about 4 and sometimes 4.5 tablets a day.  Is on Comtan 200 mg twice a day, Mirapex 1 mg 3 times a day and amantadine 100 mg 3 times a day for dyskinesia.  If he gets nervous, his legs will "twitch."  He is exercising on the bike in addition to doing physical labor.  Dropped his own programming to 3.2 and thinks that it has helped.  Did have 1-2 falls when "I get in a hurry."  Never got hurt.  Notices more trouble when he is at end of dosage.  08/15/15 update:  The patient is following up today regarding his Parkinson's disease.  He is on carbidopa/levodopa 25/100, 4- tablets per day (and then will take 2 extra 1/2 tablets as needed) along with entacapone, 200 mg twice a day.  He also takes pramipexole, 1 mg 3 times per day and amantadine 100 mg 3 times a day for dyskinesia.   Amantadine is causing dry mouth and he is using biotene to help that.   He has called several times since last  visit asking me about medical marijuana and its place in the treatment of Parkinson's disease.  I have talked to him about the position statement by the [employer].  He has not fallen since our last visit.  He reports increased dyskinesia only when he has increased stress.  Reports that he changed his DBS setting again back to where I had it previously (3.4).  He uses 2 flexeril at night for cramping.  He brings me articles to read on natural "supplements" and also asks me to look up a center in [place] that is owned by a "chiropractic neurologist" and has week long "camps" for patient to have Parkinson's disease that are supposed to help with healing of Parkinson's disease and not paid by insurance.  12/13/15 update:  The patient is following up today regarding his Parkinson's disease.  He is on carbidopa/levodopa 25/100, 4-5 tablets per day along with entacapone, 200 mg twice a day.  He also takes pramipexole, 1 mg 3 times per day and amantadine 100 mg 3 times a day for dyskinesia.  I reviewed records since last visit.  Fell at home on concrete on 11/30/15 and injured L calf.  Saw PCP few days later. Is better now.  States that he has learned that he just has to "stop" when he has a festinating gait.   No hallucinations.  No lightheadedness.    04/28/16 update:  He is on carbidopa/levodopa 25/100, 4-5 tablets per day (usually 4) along with entacapone, 200 mg twice a day.  He also takes pramipexole, 1 mg 3 times per day and amantadine 100 mg 3 times a day for dyskinesia.  He had started taking carbidopa/levodopa 50/200, one tablet in the AM as he thinks that this "gives me a 3 hour window before it is done."  He usually doesn't take it but sometimes he does.    This is odd dosing but he has rearranged his own medication to best suite him.  He has been physical therapy since our last visit.  He does think that it was helpful.  A U Step walker was recommended.  He isn't using it while out and  asks me what he can do for falls.  He does state that "my biggest falls are at home."  He does keep the walker next to him at bedtime because he finds that this is the most troublesome time when he gets up.  He finds that the top of his body gets ahead of the legs and that is why he falls.  Noting some trouble moving in confined spaces.  Reports that "I changed the DBS to 3.2 to see if it would help" but it didn't.  He is going to trainer 4-5 days a week.  Asks  if okay to hunt quail/fish if with someone.    09/01/16 update:  The patient returns today for follow-up.  He is on carbidopa/levodopa 25/100, 4 tablets throughout the day (and then 50% of the time he will add 1/2 extra tablet bid) and entacapone, 200 mg twice per day.  He also takes carbidopa/levodopa 50/200, one tablet in the morning prn.  He remains on Mirapex, 1 mg 3 times a day and amantadine 100 mg 3 times a day, for dyskinesia.  He did call me after our last visit and stated that he was falling, but admitted that he was not using his walker.  He was encouraged to start doing so.    He states today "I am not going to use that thing except at night."  States that he reduced his brain stimulator and "that didn't work so I went back up."  He has been going to rock steady boxing and thought that had helped.  Takes klonopin 0.5 mg for RBD and that is working well.  Needs a refill  12/28/16 update:  The patient returns today for follow-up, accompanied by his wife and daughter who supplement the history.  He is on carbidopa/levodopa 25/100, 4 tablets throughout the day (and then 50% of the time he will add 1/2 extra tablet bid) and entacapone, 200 mg twice per day.  He also takes carbidopa/levodopa 50/200, one tablet at night.  He remains on Mirapex, 1 mg 3 times a day (was going to try and drop that but had trouble cutting that one) and amantadine 100 mg 3 times a day, for dyskinesia.  He is doing boxing 3 days a week.  He asks me again about the [employer]  device.  States that he increased the DBS and when asked why he stated that he was hoping it would help his freezing/walking/balance.  02/24/17 update:  Patient seen today in follow-up, accompanied by his wife who supplements the history.  Patient is on carbidopa/levodopa, 25/100, 4 tablets during the day and entacapone 200 mg twice per day.  He takes 2 extra 1/2 tablets of carbidopa/levodopa 25/100 during the day as needed.   He takes carbidopa/levodopa 50/200 at bedtime.  Sometimes he takes 1/2 of a time released tablet.   Last visit, I told him he could try and reduce his pramipexole from 1 mg 3 times per day to 0.5 mg 3 times per day.  He states that he did that and noted no difference.  He remains on amantadine 100 mg 3 times per day.  He has had no hallucinations.  He continues to exercise with boxing and he loves it.  In regards to falls, his wife states that he is falling more.  He won't use the walker.  He is on klonopin for RBD and "I take one or two" and "it works really well."  He has noted last week his battery has reached "ERI."  PREVIOUS MEDICATIONS: Sinemet, Mirapex and azilect  ALLERGIES:   Allergies  Allergen Reactions  . Amoxicillin   . Celebrex [Celecoxib]   . Demerol Other (See Comments)    Hallucinations   . Penicillins     REACTION: RASH    CURRENT MEDICATIONS:  Current Outpatient Prescriptions on File Prior to Visit  Medication Sig Dispense Refill  . amantadine (SYMMETREL) 100 MG capsule TAKE 1 CAPSULE BY MOUTH 3 TIMES DAILY 90 capsule 5  . carbidopa-levodopa (SINEMET CR) 50-200 MG tablet TAKE 1 TABLET BY MOUTH DAILY AS NEEDED 90 tablet 0  .  carbidopa-levodopa (SINEMET IR) 25-100 MG tablet TAKE 1 TABLET BY MOUTH 4 TIMES DAILY AND1/2 TABLET 1-2 TIMES DAILY 150 tablet 5  . clonazePAM (KLONOPIN) 0.5 MG tablet TAKE 1-2 TABLETS BY MOUTH AT BEDTIME AS NEEDED 60 tablet 0  . cyclobenzaprine (FLEXERIL) 10 MG tablet TAKE 2 TABLETS BY MOUTH AT BEDTIME 60 tablet 5  . entacapone  (COMTAN) 200 MG tablet TAKE 1 TABLET BY MOUTH TWO TIMES DAILY 60 tablet 5  . pramipexole (MIRAPEX) 1 MG tablet TAKE 1 TABLET BY MOUTH 3 TIMES DAILY 90 tablet 5   No current facility-administered medications on file prior to visit.     PAST MEDICAL HISTORY:   Past Medical History:  Diagnosis Date  . Arthritis    left hip   . GERD (gastroesophageal reflux disease) Pre 2002   Mild  . History of kidney stones   . 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 Parkinson disease ([place]) 2004   Slowly progressive  . Ruptured appendix teens    PAST SURGICAL HISTORY:   Past Surgical History:  Procedure Laterality Date  . APPENDECTOMY    . CATARACT EXTRACTION W/ INTRAOCULAR LENS  IMPLANT, BILATERAL  2017  . DEEP BRAIN STIMULATOR PLACEMENT  8/14   L STN  . DOPPLER ECHOCARDIOGRAPHY  01/10/2009   LV NML Mild LVH EF 60-65% aortic sclerosis w/0 stenosis   . JOINT REPLACEMENT     left hip replacement 08/14/11/[facility]   . Neuro evaluation  12/19/2004   [name], benign tremor  . TOTAL HIP REVISION  08/27/2011   Procedure: TOTAL HIP REVISION;  Surgeon: Mauri Pole, MD;  Location: [facility];  Service: Orthopedics;  Laterality: Left;    SOCIAL HISTORY:   Social History   Social History  . Marital status: Married    Spouse name: N/A  . Number of children: 2  . Years of education: N/A   Occupational History  . Optometrist     medically retired   Social History Main Topics  . Smoking status: Never Smoker  . Smokeless tobacco: Never Used  . Alcohol use 3.5 oz/week    7 drink(s) per week     Comment: occassionally  . Drug use: No  . Sexual activity: Yes   Other Topics Concern  . Not on file   Social History Narrative   Married, lives with wife   2 daughters      Has living will   Wife has health care POA---then daughters   Would still accept CPR--but no prolonged artificial means (ventilator or tube feeds)     FAMILY HISTORY:   Family Status  Relation Status  . Mother Deceased at age 74       knee replacement  . Father Deceased at age 28       tremor  . Sister Stage manager in [place], [place]  . Annamarie Major (Not Specified)    ROS:  A complete 10 system review of systems was obtained and was unremarkable apart from what is mentioned above.  PHYSICAL EXAMINATION:    VITALS:   There were no vitals filed for this visit. Wt Readings from Last 3 Encounters:  12/28/16 163 lb (73.9 kg)  09/01/16 170 lb (77.1 kg)  05/05/16 162 lb (73.5 kg)     GEN:  The patient appears stated age and is in NAD. HEENT:  Normocephalic, atraumatic.  The mucous membranes are  moist. The superficial temporal arteries are without ropiness or tenderness. CV: RRR Lungs:  CTAB Neck: no bruits   Neurological examination:  Orientation: The patient is alert and oriented x3.  Cranial nerves: There is good facial symmetry.  There is minimal facial hypomimia.  Pupils are equal round and reactive to light bilaterally.  The speech is slightly dysarthric but fluent. Soft palate rises symmetrically and there is no tongue deviation. Hearing is intact to conversational tone. Motor: Strength is 5/5 in the bilateral upper and lower extremities.   Shoulder shrug is equal and symmetric.  There is no pronator drift.  Movement examination: Tone: There is normal tone in the bilateral upper extremities.  The tone in the lower extremities is normal.  Abnormal movements: There is significant dyskinesia in the R leg Coordination:  There is decreased RAM's with any form of RAMS, including alternating supination and pronation of the forearm, hand opening and closing, finger taps, heel taps and toe taps, R more than L Gait and Station: The patient has no difficulty arising out of a deep-seated chair and he festinates.    He freezes in the doorway.  He freezes in the turns.    LABS    Chemistry      Component Value Date/Time    NA 142 08/11/2016 1701   NA 139 08/15/2011 0535   K 4.5 08/11/2016 1701   K 3.8 08/15/2011 0535   CL 102 08/11/2016 1701   CL 101 08/15/2011 0535   CO2 26 08/11/2016 1701   CO2 29 08/15/2011 0535   BUN 18 08/11/2016 1701   BUN 12 08/15/2011 0535   CREATININE 0.68 (L) 08/11/2016 1701   CREATININE 0.73 08/15/2011 0535      Component Value Date/Time   CALCIUM 8.6 08/11/2016 1701   CALCIUM 7.6 (L) 08/15/2011 0535   ALKPHOS 86 08/11/2016 1701   AST 17 08/11/2016 1701   ALT 19 08/11/2016 1701   BILITOT 1.2 08/11/2016 1701     Lab Results  Component Value Date   TSH 2.82 03/16/2012    DBS programming was performed today which is described in more detail on a separate programming procedural notes.    ASSESSMENT/PLAN:  1. Parkinson's disease.    -The patient is status post left STN DBS at [facility] on 03/14/2013.  His battery is ERI  -remain on the carbidopa/levodopa 25/100,  4-5 tablets/day and comtan 200 mg bid.  He is taking carbidopa/levodopa 50/200, 1 daily.  Told him not to split this.  -try to decrease mirapex to 0.5 mg bid x 1 month and if okay go to q day for a month and then can d/c.  Continue amantadine 100 mg tid, which is helping dyskinesia.  He has some dry mouth with the amantadine, but uses Biotene.  -I did talk to patient re: diagnosis.  I would have loved to seen him early on in the disease.  By definition, patients with PD have to have sx's on the "good" side of the body within 3 years of diagnosis, and he has never had sx's on the opposite side.  This has really called into question, for me, the diagnosis but I cannot deny that he has done much better post DBS (per his reports)  -DBS programming was performed today. Will send referral to Dr. Vertell Limber for battery change  -talked again about walker and told him he needs to use at all times.  Pt resistant to that idea.  Discussed that idea again today as this  daughter hasn't been present previously  -doing well in rock steady  boxing  -he asked me about dyskinesia and told him it is from the levodopa.  He knows that but also states that he needs to take "extra."  At end of visit he then asks me if he can reduce it.  Told him we can readdress in the future, but lets get him off of mirapex first.  I have tried to reduce carbidopa/levodopa 25/100 load before and he always increases it again. 2.  Constipation  -Was made worse by amantadine.  He is using the rancho recipe.  He is using Biotene for the dry mouth but thinks that is from the amantadine as well and it may be. 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  Much greater than 50% of the 35 minute visit (doesn't include DBS time) was in counseling and coordinating care, as above.

## 2017-02-24 ENCOUNTER — Ambulatory Visit (INDEPENDENT_AMBULATORY_CARE_PROVIDER_SITE_OTHER): Payer: Medicare Other | Admitting: Neurology

## 2017-02-24 ENCOUNTER — Encounter: Payer: Self-pay | Admitting: Neurology

## 2017-02-24 VITALS — BP 110/50 | HR 62 | Ht 73.0 in | Wt 165.0 lb

## 2017-02-24 DIAGNOSIS — G249 Dystonia, unspecified: Secondary | ICD-10-CM | POA: Diagnosis not present

## 2017-02-24 DIAGNOSIS — Z9689 Presence of other specified functional implants: Secondary | ICD-10-CM

## 2017-02-24 DIAGNOSIS — G20 Parkinson's disease: Secondary | ICD-10-CM

## 2017-02-24 NOTE — Patient Instructions (Addendum)
Try to decrease mirapex to 0.5 mg twice per day x 1 month and if feel okay,  go to once a day for a month and then if still doing okay, you can stop it.  I will send a referral to Dr Vertell Limber to see when you can get your battery changed.  If you don't hear from his office, please call us.

## 2017-02-24 NOTE — Procedures (Signed)
DBS Programming was performed.    Total time spent reviewing programming was 10 minutes.  Device was confirmed to be on.  Soft start was confirmed to be on.  Impedences were checked and were within normal limits.   All impedances were well within normal limits and are recorded on a separate worksheet.  Battery was checked and was determined to be functioning normally but was near the end of life (2.58V).  Final settings were as follows:  Left brain electrode, Group A:     2-C+; Amplitude  3.5   V   ; Pulse width 60 microseconds;   Frequency   130   Hz.  Left brain electrode, group B:  1-C+, amplitude: 3.2 V, PW: 90, frequency 130  Group B was active at the time pt left the office.  Right brain electrode:     n/a

## 2017-02-25 ENCOUNTER — Telehealth: Payer: Self-pay | Admitting: Neurology

## 2017-02-25 NOTE — Telephone Encounter (Signed)
Referral faxed to  Neurosurgery at [phone] with confirmation received. They will contact the patient to schedule.  

## 2017-02-25 NOTE — Addendum Note (Signed)
Addended byAnnamaria Helling on: 02/25/2017 08:03 AM   Modules accepted: Orders

## 2017-03-08 ENCOUNTER — Telehealth: Payer: Self-pay

## 2017-03-08 NOTE — Telephone Encounter (Signed)
Place left v/m requesting order for shingrix and pneumococcal  Injection sent to [facility]. Pt got zostavax 09/12/2007 and pneumovax 07/21/2007(per hx immunization list). prevnar 13 05/10/2014. Please advise.

## 2017-03-08 NOTE — Telephone Encounter (Signed)
He is due for a pneumovax booster and can get shingrix Okay to send Rx for both of those

## 2017-03-09 ENCOUNTER — Telehealth: Payer: Self-pay | Admitting: Neurology

## 2017-03-09 ENCOUNTER — Other Ambulatory Visit: Payer: Self-pay | Admitting: Neurosurgery

## 2017-03-09 MED ORDER — PNEUMOCOCCAL VAC POLYVALENT 25 MCG/0.5ML IJ INJ: 0.5000 mL | INJECTION | Freq: Once | INTRAMUSCULAR | 0 refills | Status: AC

## 2017-03-09 MED ORDER — ZOSTER VAC RECOMB ADJUVANTED 50 MCG/0.5ML IM SUSR: 0.5000 mL | Freq: Once | INTRAMUSCULAR | 0 refills | Status: AC

## 2017-03-09 NOTE — Telephone Encounter (Signed)
Mr. Kupper called and would like to speak with you. Thanks

## 2017-03-09 NOTE — Telephone Encounter (Signed)
Spoke with pt and informed him of Dr. Alla German message. Pt understood and verified his pharmacy.  He had no additional questions at this time. Nothing further is needed

## 2017-03-09 NOTE — Telephone Encounter (Signed)
Patient's daughter called needing to speak with you regarding the Referral. Please call. Thanks

## 2017-03-09 NOTE — Telephone Encounter (Signed)
PT called and said he is returning your call

## 2017-03-09 NOTE — Telephone Encounter (Signed)
Spoke with patient. He says he feels no different right now.  He states he does not have an appt with [facility]. Dr. Melven Sartorius nurse had stated that patient had an appt with Dr. Vertell Limber on 03/01/17 but cancelled it to go to [facility]. Patient states that isn't true. I did tell him they would try to fit him in for office visit on 03/23/17 and to have battery replacement shortly after. He is agreeable.  LMOM for [facility] at Dr. Melven Sartorius office to please call patient with an appt.  Dr. Carles Collet Juluis Rainier.

## 2017-03-09 NOTE — Telephone Encounter (Signed)
Patient called back and let the front desk know he is getting EOS/ERI codes on DBS.   I called Dr. Melven Sartorius office. We faxed over referral on 02/25/2017 for patient to be seen for battery change.  They state he has not been scheduled for an appt yet. LMOM for [facility], Dr. Melven Sartorius secretary, to call me back to let me know when he can be seen. Awaiting call back.   Dr. Carles Collet please advise.

## 2017-03-09 NOTE — Telephone Encounter (Signed)
Does he feel any different physically?  I talked with Dr Donald Pore nurse.  Vertell Limber is out of the office but they can change it the first week of September.  He needs appointment first with Dr. Vertell Limber so H&P can be done and sterns nurse thought that they could work that in on 9/4.

## 2017-03-09 NOTE — Telephone Encounter (Signed)
Spoke with daughter and clarified surgery/appt with Dr Vertell Limber plan. Her father is confused but she will talk to him and try to clarify.

## 2017-03-23 ENCOUNTER — Encounter (HOSPITAL_COMMUNITY)
Admission: RE | Admit: 2017-03-23 | Discharge: 2017-03-23 | Disposition: A | Discharge status: Home / Self Care | Payer: Medicare Other | Source: Ambulatory Visit | Attending: Neurosurgery | Admitting: Neurosurgery

## 2017-03-23 ENCOUNTER — Encounter (HOSPITAL_COMMUNITY): Payer: Self-pay

## 2017-03-23 DIAGNOSIS — Z4542 Encounter for adjustment and management of neuropacemaker (brain) (peripheral nerve) (spinal cord): Secondary | ICD-10-CM | POA: Diagnosis not present

## 2017-03-23 DIAGNOSIS — Z79899 Other long term (current) drug therapy: Secondary | ICD-10-CM | POA: Diagnosis not present

## 2017-03-23 DIAGNOSIS — R030 Elevated blood-pressure reading, without diagnosis of hypertension: Secondary | ICD-10-CM | POA: Diagnosis not present

## 2017-03-23 DIAGNOSIS — Z720 Tobacco use: Secondary | ICD-10-CM | POA: Diagnosis not present

## 2017-03-23 DIAGNOSIS — I10 Essential (primary) hypertension: Secondary | ICD-10-CM | POA: Diagnosis not present

## 2017-03-23 DIAGNOSIS — G20 Parkinson's disease: Secondary | ICD-10-CM | POA: Diagnosis not present

## 2017-03-23 LAB — CBC
HCT: 30.4 % — ABNORMAL LOW (ref 39.0–52.0)
HEMOGLOBIN: 10.4 g/dL — AB (ref 13.0–17.0)
MCH: 37.7 pg — ABNORMAL HIGH (ref 26.0–34.0)
MCHC: 34.2 g/dL (ref 30.0–36.0)
MCV: 110.1 fL — ABNORMAL HIGH (ref 78.0–100.0)
PLATELETS: 238 10*3/uL (ref 150–400)
RBC: 2.76 MIL/uL — AB (ref 4.22–5.81)
RDW: 13.7 % (ref 11.5–15.5)
WBC: 5.8 10*3/uL (ref 4.0–10.5)

## 2017-03-23 LAB — BASIC METABOLIC PANEL
ANION GAP: 5 (ref 5–15)
BUN: 17 mg/dL (ref 6–20)
CALCIUM: 8.8 mg/dL — AB (ref 8.9–10.3)
CO2: 28 mmol/L (ref 22–32)
Chloride: 106 mmol/L (ref 101–111)
Creatinine, Ser: 0.82 mg/dL (ref 0.61–1.24)
GFR calc Af Amer: >60 mL/min (ref >60)
GFR calc non Af Amer: >60 mL/min (ref >60)
Glucose, Bld: 96 mg/dL (ref 65–99)
Potassium: 4.2 mmol/L (ref 3.5–5.1)
Sodium: 139 mmol/L (ref 135–145)

## 2017-03-23 LAB — SURGICAL PCR SCREEN
MRSA, PCR: POSITIVE — AB
STAPHYLOCOCCUS AUREUS: POSITIVE — AB

## 2017-03-23 NOTE — Progress Notes (Signed)
PCP: Dr. Silvio Pate Cardiologist: Denies  EKG: n/a CXR: n/a ECHO, Stress Test, Cardiac Cath: denies  Patient denies shortness of breath, fever, cough, and chest pain at PAT appointment.  Patient verbalized understanding of instructions provided today at the PAT appointment.  Patient asked to review instructions at home and day of surgery.   Instructed pt to bring stimulator remote with him DOS.

## 2017-03-23 NOTE — Pre-Procedure Instructions (Signed)
Casey Tucker  03/23/2017      [place] [place] [place] [address] Phone: [phone] Fax: [phone]    Your procedure is scheduled on March 25, 2017.   Report to [facility] Admitting at 10:40 A.M.   Call this number if you have problems the morning of surgery:  [phone]   Remember:  Do not eat food or drink liquids after midnight.   Take these medicines the morning of surgery with A SIP OF WATER Amantadine (Symmetrel), Carbidopa-levodopa (Sinemet IR), Entacapone (Comtan), Pramipexole (Mirapex)  7 days prior to surgery STOP taking any Aspirin, Aleve, Naproxen, Ibuprofen, Motrin, Advil, Goody's, BC's, all herbal medications, fish oil, and all vitamins    Do not wear jewelry, make-up or nail polish.  Do not wear lotions, powders, or perfumes, or deodorant.  Do not shave 48 hours prior to surgery.  Men may shave face and neck.  Do not bring valuables to the hospital.   [facility] is not responsible for any belongings or valuables.  Contacts, dentures or bridgework may not be worn into surgery.  Leave your suitcase in the car.  After surgery it may be brought to your room.  For patients admitted to the hospital, discharge time will be determined by your treatment team.  Patients discharged the day of surgery will not be allowed to drive home.    Special instructions:   [facility]- Preparing For Surgery  Before surgery, you can play an important role. Because skin is not sterile, your skin needs to be as free of germs as possible. You can reduce the number of germs on your skin by washing with CHG (chlorahexidine gluconate) Soap before surgery.  CHG is an antiseptic cleaner which kills germs and bonds with the skin to continue killing germs even after washing.  Please do not use if you have an allergy to CHG or antibacterial soaps. If your skin becomes reddened/irritated stop using the CHG.   Do not shave (including legs and underarms) for at least 48 hours prior to first CHG shower. It is OK to shave your face.  Please follow these instructions carefully.   1. Shower the NIGHT BEFORE SURGERY and the MORNING OF SURGERY with CHG.   2. If you chose to wash your hair, wash your hair first as usual with your normal shampoo.  3. After you shampoo, rinse your hair and body thoroughly to remove the shampoo.  4. Use CHG as you would any other liquid soap. You can apply CHG directly to the skin and wash gently with a scrungie or a clean washcloth.   5. Apply the CHG Soap to your body ONLY FROM THE NECK DOWN.  Do not use on open wounds or open sores. Avoid contact with your eyes, ears, mouth and genitals (private parts). Wash genitals (private parts) with your normal soap.  6. Wash thoroughly, paying special attention to the area where your surgery will be performed.  7. Thoroughly rinse your body with warm water from the neck down.  8. DO NOT shower/wash with your normal soap after using and rinsing off the CHG Soap.  9. Pat yourself dry with a CLEAN TOWEL.   10. Wear CLEAN PAJAMAS   11. Place CLEAN SHEETS on your bed the night of your first shower and DO NOT SLEEP WITH PETS.    Day of Surgery: Do not apply any deodorants/lotions. Please wear clean clothes to the  hospital/surgery center.      Please read over the following fact sheets that you were given. Pain Booklet, Coughing and Deep Breathing and Surgical Site Infection Prevention

## 2017-03-24 MED ORDER — VANCOMYCIN HCL IN DEXTROSE 1-5 GM/200ML-% IV SOLN
1000.0000 mg | INTRAVENOUS | Status: AC
  Administered 2017-03-25: 1000 mg via INTRAVENOUS
  Filled 2017-03-24: qty 200

## 2017-03-25 ENCOUNTER — Ambulatory Visit (HOSPITAL_COMMUNITY)
Admission: RE | Admit: 2017-03-25 | Discharge: 2017-03-25 | Disposition: A | Discharge status: Home / Self Care | Payer: Medicare Other | Source: Ambulatory Visit | Attending: Neurosurgery | Admitting: Neurosurgery

## 2017-03-25 ENCOUNTER — Ambulatory Visit (HOSPITAL_COMMUNITY): Payer: Medicare Other | Admitting: Anesthesiology

## 2017-03-25 ENCOUNTER — Encounter (HOSPITAL_COMMUNITY): Payer: Self-pay | Admitting: Urology

## 2017-03-25 ENCOUNTER — Encounter (HOSPITAL_COMMUNITY)
Admission: RE | Disposition: A | Discharge status: Home / Self Care | Payer: Self-pay | Source: Ambulatory Visit | Attending: Neurosurgery

## 2017-03-25 DIAGNOSIS — Z720 Tobacco use: Secondary | ICD-10-CM | POA: Insufficient documentation

## 2017-03-25 DIAGNOSIS — I350 Nonrheumatic aortic (valve) stenosis: Secondary | ICD-10-CM | POA: Diagnosis not present

## 2017-03-25 DIAGNOSIS — Z79899 Other long term (current) drug therapy: Secondary | ICD-10-CM | POA: Diagnosis not present

## 2017-03-25 DIAGNOSIS — Z4542 Encounter for adjustment and management of neuropacemaker (brain) (peripheral nerve) (spinal cord): Secondary | ICD-10-CM | POA: Diagnosis not present

## 2017-03-25 DIAGNOSIS — I10 Essential (primary) hypertension: Secondary | ICD-10-CM | POA: Diagnosis not present

## 2017-03-25 DIAGNOSIS — K219 Gastro-esophageal reflux disease without esophagitis: Secondary | ICD-10-CM | POA: Diagnosis not present

## 2017-03-25 DIAGNOSIS — T85193A Other mechanical complication of implanted electronic neurostimulator, generator, initial encounter: Secondary | ICD-10-CM | POA: Diagnosis not present

## 2017-03-25 DIAGNOSIS — G20 Parkinson's disease: Secondary | ICD-10-CM | POA: Diagnosis not present

## 2017-03-25 DIAGNOSIS — T85113A Breakdown (mechanical) of implanted electronic neurostimulator, generator, initial encounter: Secondary | ICD-10-CM | POA: Diagnosis not present

## 2017-03-25 HISTORY — PX: SUBTHALAMIC STIMULATOR BATTERY REPLACEMENT: SHX5405

## 2017-03-25 SURGERY — SUBTHALAMIC STIMULATOR BATTERY REPLACEMENT
Anesthesia: General | Site: Chest | Laterality: Left

## 2017-03-25 MED ORDER — SUGAMMADEX SODIUM 200 MG/2ML IV SOLN
INTRAVENOUS | Status: AC
  Filled 2017-03-25: qty 2

## 2017-03-25 MED ORDER — ONDANSETRON HCL 4 MG/2ML IJ SOLN
INTRAMUSCULAR | Status: DC | PRN
  Administered 2017-03-25: 4 mg via INTRAVENOUS

## 2017-03-25 MED ORDER — BACITRACIN ZINC 500 UNIT/GM EX OINT
TOPICAL_OINTMENT | CUTANEOUS | Status: AC
  Filled 2017-03-25: qty 28.35

## 2017-03-25 MED ORDER — FENTANYL CITRATE (PF) 250 MCG/5ML IJ SOLN
INTRAMUSCULAR | Status: AC
  Filled 2017-03-25: qty 5

## 2017-03-25 MED ORDER — FENTANYL CITRATE (PF) 100 MCG/2ML IJ SOLN: 25.0000 ug | INTRAMUSCULAR | Status: DC | PRN

## 2017-03-25 MED ORDER — MUPIROCIN 2 % EX OINT
1.0000 "application " | TOPICAL_OINTMENT | Freq: Two times a day (BID) | CUTANEOUS | Status: DC
  Administered 2017-03-25: 1 via TOPICAL

## 2017-03-25 MED ORDER — BUPIVACAINE HCL (PF) 0.5 % IJ SOLN
INTRAMUSCULAR | Status: DC | PRN
  Administered 2017-03-25: 5 mL

## 2017-03-25 MED ORDER — FENTANYL CITRATE (PF) 100 MCG/2ML IJ SOLN
INTRAMUSCULAR | Status: DC | PRN
  Administered 2017-03-25: 50 ug via INTRAVENOUS

## 2017-03-25 MED ORDER — MIDAZOLAM HCL 2 MG/2ML IJ SOLN: 0.5000 mg | Freq: Once | INTRAMUSCULAR | Status: DC | PRN

## 2017-03-25 MED ORDER — LIDOCAINE-EPINEPHRINE 1 %-1:100000 IJ SOLN
INTRAMUSCULAR | Status: DC | PRN
  Administered 2017-03-25: 5 mL

## 2017-03-25 MED ORDER — ROCURONIUM BROMIDE 100 MG/10ML IV SOLN
INTRAVENOUS | Status: DC | PRN
  Administered 2017-03-25: 50 mg via INTRAVENOUS

## 2017-03-25 MED ORDER — VANCOMYCIN HCL 1000 MG IV SOLR
INTRAVENOUS | Status: AC
  Filled 2017-03-25: qty 1000

## 2017-03-25 MED ORDER — CHLORHEXIDINE GLUCONATE CLOTH 2 % EX PADS: 6.0000 | MEDICATED_PAD | Freq: Once | CUTANEOUS | Status: DC

## 2017-03-25 MED ORDER — VANCOMYCIN HCL 1000 MG IV SOLR
INTRAVENOUS | Status: DC | PRN
  Administered 2017-03-25: 1000 mg via TOPICAL

## 2017-03-25 MED ORDER — MUPIROCIN 2 % EX OINT
TOPICAL_OINTMENT | CUTANEOUS | Status: AC
  Administered 2017-03-25: 1 via TOPICAL
  Filled 2017-03-25: qty 22

## 2017-03-25 MED ORDER — LIDOCAINE-EPINEPHRINE 1 %-1:100000 IJ SOLN
INTRAMUSCULAR | Status: AC
  Filled 2017-03-25: qty 1

## 2017-03-25 MED ORDER — LACTATED RINGERS IV SOLN
INTRAVENOUS | Status: DC
Start: 2017-03-25 — End: 2017-03-25
  Administered 2017-03-25 (×2): via INTRAVENOUS

## 2017-03-25 MED ORDER — EPHEDRINE 5 MG/ML INJ
INTRAVENOUS | Status: AC
  Filled 2017-03-25: qty 10

## 2017-03-25 MED ORDER — BUPIVACAINE HCL (PF) 0.5 % IJ SOLN
INTRAMUSCULAR | Status: AC
  Filled 2017-03-25: qty 30

## 2017-03-25 MED ORDER — ONDANSETRON HCL 4 MG/2ML IJ SOLN
INTRAMUSCULAR | Status: AC
  Filled 2017-03-25: qty 2

## 2017-03-25 MED ORDER — PROMETHAZINE HCL 25 MG/ML IJ SOLN: 6.2500 mg | INTRAMUSCULAR | Status: DC | PRN

## 2017-03-25 MED ORDER — DEXAMETHASONE SODIUM PHOSPHATE 10 MG/ML IJ SOLN
INTRAMUSCULAR | Status: DC | PRN
  Administered 2017-03-25: 10 mg via INTRAVENOUS

## 2017-03-25 MED ORDER — ROCURONIUM BROMIDE 10 MG/ML (PF) SYRINGE
PREFILLED_SYRINGE | INTRAVENOUS | Status: AC
  Filled 2017-03-25: qty 5

## 2017-03-25 MED ORDER — DEXAMETHASONE SODIUM PHOSPHATE 10 MG/ML IJ SOLN
INTRAMUSCULAR | Status: AC
  Filled 2017-03-25: qty 1

## 2017-03-25 MED ORDER — SUGAMMADEX SODIUM 200 MG/2ML IV SOLN
INTRAVENOUS | Status: DC | PRN
  Administered 2017-03-25: 200 mg via INTRAVENOUS

## 2017-03-25 MED ORDER — PROPOFOL 10 MG/ML IV BOLUS
INTRAVENOUS | Status: DC | PRN
  Administered 2017-03-25: 130 mg via INTRAVENOUS

## 2017-03-25 MED ORDER — EPHEDRINE SULFATE 50 MG/ML IJ SOLN
INTRAMUSCULAR | Status: DC | PRN
  Administered 2017-03-25 (×3): 10 mg via INTRAVENOUS

## 2017-03-25 MED ORDER — 0.9 % SODIUM CHLORIDE (POUR BTL) OPTIME
TOPICAL | Status: DC | PRN
  Administered 2017-03-25: 1000 mL

## 2017-03-25 MED ORDER — LIDOCAINE 2% (20 MG/ML) 5 ML SYRINGE
INTRAMUSCULAR | Status: AC
  Filled 2017-03-25: qty 5

## 2017-03-25 MED ORDER — LIDOCAINE HCL (CARDIAC) 20 MG/ML IV SOLN
INTRAVENOUS | Status: DC | PRN
  Administered 2017-03-25: 50 mg via INTRAVENOUS

## 2017-03-25 SURGICAL SUPPLY — 48 items
ADH SKN CLS APL DERMABOND .7 (GAUZE/BANDAGES/DRESSINGS) ×1
CANISTER SUCT 3000ML PPV (MISCELLANEOUS) ×2 IMPLANT
CARTRIDGE OIL MAESTRO DRILL (MISCELLANEOUS) ×1 IMPLANT
DECANTER SPIKE VIAL GLASS SM (MISCELLANEOUS) ×2 IMPLANT
DERMABOND ADVANCED (GAUZE/BANDAGES/DRESSINGS) ×1
DERMABOND ADVANCED .7 DNX12 (GAUZE/BANDAGES/DRESSINGS) ×1 IMPLANT
DIFFUSER DRILL AIR PNEUMATIC (MISCELLANEOUS) ×1 IMPLANT
DRAPE CAMERA VIDEO/LASER (DRAPES) ×1 IMPLANT
DRAPE LAPAROTOMY 100X72 PEDS (DRAPES) ×2 IMPLANT
DRAPE POUCH INSTRU U-SHP 10X18 (DRAPES) ×2 IMPLANT
DRSG OPSITE POSTOP 4X6 (GAUZE/BANDAGES/DRESSINGS) ×1 IMPLANT
DURAPREP 26ML APPLICATOR (WOUND CARE) ×2 IMPLANT
ELECT CAUTERY BLADE 6.4 (BLADE) ×1 IMPLANT
GAUZE SPONGE 4X4 16PLY XRAY LF (GAUZE/BANDAGES/DRESSINGS) IMPLANT
GLOVE BIO SURGEON STRL SZ8 (GLOVE) ×2 IMPLANT
GLOVE BIOGEL PI IND STRL 7.0 (GLOVE) IMPLANT
GLOVE BIOGEL PI IND STRL 7.5 (GLOVE) IMPLANT
GLOVE BIOGEL PI IND STRL 8 (GLOVE) ×1 IMPLANT
GLOVE BIOGEL PI IND STRL 8.5 (GLOVE) ×1 IMPLANT
GLOVE BIOGEL PI INDICATOR 7.0 (GLOVE) ×1
GLOVE BIOGEL PI INDICATOR 7.5 (GLOVE) ×1
GLOVE BIOGEL PI INDICATOR 8 (GLOVE) ×1
GLOVE BIOGEL PI INDICATOR 8.5 (GLOVE) ×1
GLOVE ECLIPSE 8.0 STRL XLNG CF (GLOVE) ×2 IMPLANT
GLOVE EXAM NITRILE LRG STRL (GLOVE) IMPLANT
GLOVE EXAM NITRILE XL STR (GLOVE) IMPLANT
GLOVE EXAM NITRILE XS STR PU (GLOVE) IMPLANT
GLOVE SURG SS PI 7.0 STRL IVOR (GLOVE) ×1 IMPLANT
GOWN STRL REUS W/ TWL LRG LVL3 (GOWN DISPOSABLE) IMPLANT
GOWN STRL REUS W/ TWL XL LVL3 (GOWN DISPOSABLE) ×1 IMPLANT
GOWN STRL REUS W/TWL 2XL LVL3 (GOWN DISPOSABLE) ×2 IMPLANT
GOWN STRL REUS W/TWL LRG LVL3 (GOWN DISPOSABLE)
GOWN STRL REUS W/TWL XL LVL3 (GOWN DISPOSABLE) ×2
KIT BASIN OR (CUSTOM PROCEDURE TRAY) ×2 IMPLANT
KIT ROOM TURNOVER OR (KITS) ×2 IMPLANT
NDL HYPO 25X1 1.5 SAFETY (NEEDLE) ×1 IMPLANT
NEEDLE HYPO 25X1 1.5 SAFETY (NEEDLE) ×2 IMPLANT
NEUROSTIM OCTOPOLAR ~~LOC~~ 60X55 (Neuro Prosthesis/Implant) ×1 IMPLANT
NS IRRIG 1000ML POUR BTL (IV SOLUTION) ×2 IMPLANT
OIL CARTRIDGE MAESTRO DRILL (MISCELLANEOUS)
PACK LAMINECTOMY NEURO (CUSTOM PROCEDURE TRAY) ×2 IMPLANT
PAD ARMBOARD 7.5X6 YLW CONV (MISCELLANEOUS) ×6 IMPLANT
SUT VIC AB 2-0 CP2 18 (SUTURE) ×2 IMPLANT
SUT VIC AB 3-0 SH 8-18 (SUTURE) ×2 IMPLANT
SYR BULB 3OZ (MISCELLANEOUS) ×1 IMPLANT
TOWEL GREEN STERILE (TOWEL DISPOSABLE) ×2 IMPLANT
TOWEL GREEN STERILE FF (TOWEL DISPOSABLE) ×2 IMPLANT
WATER STERILE IRR 1000ML POUR (IV SOLUTION) ×2 IMPLANT

## 2017-03-25 NOTE — Anesthesia Preprocedure Evaluation (Addendum)
Anesthesia Evaluation  Patient identified by MRN, date of birth, ID band Patient awake    Reviewed: Allergy & Precautions, NPO status , Patient's Chart, lab work & pertinent test results  History of Anesthesia Complications Negative for: history of anesthetic complications  Airway Mallampati: II  TM Distance: >3 FB Neck ROM: Full    Dental  (+) Dental Advisory Given   Pulmonary neg pulmonary ROS,    breath sounds clear to auscultation       Cardiovascular (-) angina Rhythm:Regular Rate:Normal  '10 ECHO: EF 60-65%, valves OK   Neuro/Psych parkinson's    GI/Hepatic Neg liver ROS, GERD  Controlled,  Endo/Other  negative endocrine ROS  Renal/GU negative Renal ROS     Musculoskeletal  (+) Arthritis ,   Abdominal   Peds  Hematology  (+) Blood dyscrasia (Hb 10.4), anemia ,   Anesthesia Other Findings   Reproductive/Obstetrics                            Anesthesia Physical Anesthesia Plan  ASA: III  Anesthesia Plan: General   Post-op Pain Management:    Induction: Intravenous  PONV Risk Score and Plan: 2 and Ondansetron and Dexamethasone  Airway Management Planned: Oral ETT  Additional Equipment:   Intra-op Plan:   Post-operative Plan: Extubation in OR  Informed Consent: I have reviewed the patients History and Physical, chart, labs and discussed the procedure including the risks, benefits and alternatives for the proposed anesthesia with the patient or authorized representative who has indicated his/her understanding and acceptance.   Dental advisory given  Plan Discussed with: CRNA and Surgeon  Anesthesia Plan Comments: (Plan routine monitors, GETA)        Anesthesia Quick Evaluation

## 2017-03-25 NOTE — Anesthesia Postprocedure Evaluation (Signed)
Anesthesia Post Note  Patient: Jashun R Wempe  Procedure(s) Performed: Procedure(s) (LRB): Deep Brain Stimulator battery replacement (Left)     Patient location during evaluation: PACU Anesthesia Type: General Level of consciousness: awake and alert, patient cooperative and oriented Pain management: pain level controlled Vital Signs Assessment: post-procedure vital signs reviewed and stable Respiratory status: spontaneous breathing, nonlabored ventilation, respiratory function stable and patient connected to nasal cannula oxygen Cardiovascular status: blood pressure returned to baseline and stable Postop Assessment: no signs of nausea or vomiting Anesthetic complications: no    Last Vitals:  Vitals:   03/25/17 1015 03/25/17 1030  BP: (!) 126/99 (!) 125/94  Pulse: 72 72  Resp: 15 16  Temp:  (!) 36.1 C  SpO2: 100% 100%    Last Pain:  Vitals:   03/25/17 1015  PainSc: 0-No pain                 Lindey Renzulli,E. Jereme Loren

## 2017-03-25 NOTE — H&P (Signed)
Patient ID:   [id number] Patient: Casey Tucker  Date of Birth: 1941-06-19 Visit Type: Office Visit   Date: 03/23/2017 04:00 PM Provider: Marchia Meiers. Vertell Limber MD   This 76 year old male presents for battery change.   History of Present Illness: 1.  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 Doristine Devoid referral.   Diagnosis of Parkinson's was made by [name] in 2005.  Deep brain stimulator (left STN) was placed at [facility] August 2014.  Patient noted significant improvement in his dyskinesias.  Currently, he notes significant difficulties with right leg dyskinesia, but he is unable to recall when his symptoms worsened.   Interrogation of device reveals left STN placement, date of implant March 14, 2013; device at [facility]. Program B in use, case positive, 1- at 3.2 volts, 90, 130Hz   History:  Parkinson's diagnosis 2005 Surgical history:  Left THR x2 2004, DBS 2014 at [facility]  Patient says left side still doesn't bother him, but right sided dyskinesias are quite bothersome, leg greater than arm.  He is still able to build furniture, but this has become much more difficult for him.           PAST MEDICAL/SURGICAL HISTORY   (Detailed)  Disease/disorder Onset Date Management Date Comments    DBS placement 2014     Hip replacement 2004   Parkinson disease         PAST MEDICAL HISTORY, SURGICAL HISTORY, FAMILY HISTORY, SOCIAL HISTORY AND REVIEW OF SYSTEMS I have reviewed the patient's past medical, surgical, family and social history as well as the comprehensive review of systems as included on the [facility] history form dated 03/23/2017, which I have signed.  Family History  (Detailed) Relationship Family Member Name Deceased Age at Death Condition Onset Age Cause of Death      Family history of Emphysema  N     Social History:  (Detailed) Tobacco use reviewed. Preferred  language is Unknown.   Tobacco use status: Current non-smoker. Smoking status: Never smoker.  SMOKING STATUS Type Smoking Status Usage Per Day Years Used Total Pack Years   Never smoker          MEDICATIONS(added, continued or stopped this visit): Started Medication Directions Instruction Stopped   amantidine 100 ORAL CAPSULE      carbidopa 10 mg-levodopa 100 mg tablet take 1 tablet by oral route 3 times every day     clonazepam 0.5 mg tablet take 1 tablet by oral route  every day     cyclobenzaprine 10 mg tablet take 1 tablet by oral route 3 times every day     entacapone 200 mg tablet take 1 tablet by oral route 3 times every day in combination with carbidopa and levodopa     Mirapex 1 mg tablet take 1 tablet by oral route  every day       ALLERGIES: Ingredient Reaction Medication Name Comment  CELECOXIB skin turns yellow Celebrex    Reviewed, updated.   Review of Systems System Neg/Pos Details  Constitutional Negative Chills, Fatigue, Fever, Malaise, Night sweats, Weight gain and Weight loss.  ENMT Negative Ear drainage, Hearing loss, Nasal drainage, Otalgia, Sinus pressure and Sore throat.  Eyes Negative Eye discharge, Eye pain and Vision changes.  Respiratory Negative Chronic cough, Cough, Dyspnea, Known TB exposure and Wheezing.  Cardio Negative Chest pain, Claudication, Edema and Irregular heartbeat/palpitations.  GI Negative Abdominal pain, Blood in stool,  Change in stool pattern, Constipation, Decreased appetite, Diarrhea, Heartburn, Nausea and Vomiting.  GU Negative Dribbling, Dysuria, Erectile dysfunction, Hematuria, Polyuria (Genitourinary), Slow stream, Urinary frequency, Urinary incontinence and Urinary retention.  Endocrine Negative Cold intolerance, Heat intolerance, Polydipsia and Polyphagia.  Neuro Negative Dizziness, Extremity weakness, Gait disturbance, Headache, Memory impairment, Numbness in extremity, Seizures and Tremors.  Psych Negative Anxiety,  Depression and Insomnia.  Integumentary Negative Brittle hair, Brittle nails, Change in shape/size of mole(s), Hair loss, Hirsutism, Hives, Pruritus, Rash and Skin lesion.  MS Negative Back pain, Joint pain, Joint swelling, Muscle weakness and Neck pain.  Hema/Lymph Negative Easy bleeding, Easy bruising and Lymphadenopathy.  Allergic/Immuno Negative Contact allergy, Environmental allergies, Food allergies and Seasonal allergies.  Reproductive Negative Penile discharge and Sexual dysfunction.   Vitals Date Temp F BP Pulse Ht In Wt Lb BMI BSA Pain Score  03/23/2017  132/62 69 73 164.2 21.66  0/10     PHYSICAL EXAM General Level of Distress: no acute distress Overall Appearance: normal  Head and Face  Right Left  Fundoscopic Exam:  normal normal    Cardiovascular Cardiac: regular rate and rhythm without murmur  Right Left  Carotid Pulses: normal normal  Respiratory Lungs: clear to auscultation  Neurological Orientation: normal Recent and Remote Memory: normal Attention Span and Concentration:   normal Language: normal Fund of Knowledge: normal  Right Left Sensation: normal normal Upper Extremity Coordination: normal normal  Lower Extremity Coordination: normal normal  Musculoskeletal Gait and Station: normal  Right Left Upper Extremity Muscle Strength: normal normal Lower Extremity Muscle Strength: normal normal Upper Extremity Muscle Tone:  dyskinetic normal Lower Extremity Muscle Tone: dyskinetic normal   Motor Strength Upper and lower extremity motor strength was tested in the clinically pertinent muscles.     Deep Tendon Reflexes  Right Left Biceps: normal normal Triceps: normal normal Brachioradialis: normal normal Patellar: normal normal Achilles: normal normal  Cranial Nerves II. Optic Nerve/Visual Fields: normal III. Oculomotor: normal IV. Trochlear: normal V. Trigeminal: normal VI. Abducens: normal VII. Facial: normal VIII.  Acoustic/Vestibular: normal IX. Glossopharyngeal: normal X. Vagus: normal XI. Spinal Accessory: normal XII. Hypoglossal: normal  Motor and other Tests Lhermittes: negative Rhomberg: negative Pronator drift: absent     Right Left Hoffman's: normal normal Clonus: normal normal Babinski: normal normal   Additional Findings:  Left chest IPG well-healed.  Dyskinesias right side, leg greater than arm    IMPRESSION Depleted Medtronic IPG for Left STN DBS, originally placed at [facility].  Currently working with Dr. Carles Collet, who will reprogram device after IPG has been changed.  Completed Orders (this encounter) Order Details Reason Side Interpretation Result Initial Treatment Date Region  Hypertension education Continue to monitor blood pressure. If blood pressure remains elevated contact primary care provider         Assessment/Plan # Detail Type Description   1. Assessment Parkinsons (G20).       2. Assessment End of battery life of deep brain stimulator (Z45.42).       3. Assessment Elevated blood-pressure reading, w/o diagnosis of htn (R03.0).           Pain Management Plan Pain Scale: 0/10. Method: Numeric Pain Intensity Scale. Onset: 03/16/2017.  Revision of left chest STN DBS IPG.  Orders: Instruction(s)/Education: Assessment Instruction  R03.0 Hypertension education             Provider:  Vertell Limber MD, Marchia Meiers 03/25/2017 7:19 AM  Dictation edited by: Marchia Meiers. Vertell Limber    CC Providers: [name] [place]  [facility] [place],  [place]  [id number]-   Rebecca Tat  [address] [place], [place] [address]              Electronically signed by Marchia Meiers. Vertell Limber MD on 03/25/2017 07:19 AM

## 2017-03-25 NOTE — Interval H&P Note (Signed)
History and Physical Interval Note:  03/25/2017 7:19 AM  Casey Tucker  has presented today for surgery, with the diagnosis of Parkinsons  The various methods of treatment have been discussed with the patient and family. After consideration of risks, benefits and other options for treatment, the patient has consented to  Procedure(s) with comments: Deep Brain Stimulator battery replacement (N/A) - Deep Brain Stimulator battery replacement as a surgical intervention .  The patient's history has been reviewed, patient examined, no change in status, stable for surgery.  I have reviewed the patient's chart and labs.  Questions were answered to the patient's satisfaction.     Casey Tucker

## 2017-03-25 NOTE — Transfer of Care (Signed)
Immediate Anesthesia Transfer of Care Note  Patient: Casey Tucker  Procedure(s) Performed: Procedure(s) with comments: Deep Brain Stimulator battery replacement (Left) - left  Patient Location: PACU  Anesthesia Type:General  Level of Consciousness: awake, alert , oriented and patient cooperative  Airway & Oxygen Therapy: Patient Spontanous Breathing and Patient connected to nasal cannula oxygen  Post-op Assessment: Report given to RN and Post -op Vital signs reviewed and stable  Post vital signs: Reviewed and stable  Last Vitals:  Vitals:   03/25/17 0639 03/25/17 0651  BP: (!) 118/40   Pulse: (!) 6 65  Resp: 18   Temp: (!) 36.4 C   SpO2: 100%     Last Pain: There were no vitals filed for this visit.       Complications: No apparent anesthesia complications

## 2017-03-25 NOTE — Brief Op Note (Signed)
03/25/2017  9:41 AM  PATIENT:  Casey Tucker  76 y.o. male  PRE-OPERATIVE DIAGNOSIS:  Parkinson's Disease with depleted Implanted Pulse Generator for Left STN DBS electrode  POST-OPERATIVE DIAGNOSIS:   Parkinson's Disease with depleted Implanted Pulse Generator for Left STN DBS electrode  PROCEDURE:  Procedure(s) with comments: Deep Brain Stimulator battery replacement (Left) - left  SURGEON:  Surgeon(s) and Role:    Erline Levine, MD - Primary  PHYSICIAN ASSISTANT:   ASSISTANTS: Poteat, RN   ANESTHESIA:   general  EBL:  No intake/output data recorded.  BLOOD ADMINISTERED:none  DRAINS: none   LOCAL MEDICATIONS USED:  MARCAINE    and LIDOCAINE   SPECIMEN:  No Specimen  DISPOSITION OF SPECIMEN:  N/A  COUNTS:  YES  TOURNIQUET:  * No tourniquets in log *  DICTATION: DICTATION: Patient has implanted subthalamic stimulator electrode and IPG, which is now depleted.  It was elected for patient to undergo IPG revision.  PROCEDURE: Patient was brought to the operating room and given general anesthesia.  Left upper chest was prepped with betadine scrub and Duraprep.  Area of planned incision was infiltrated with lidocaine.  Prior incision was reopened and the old IPG was externalized.  New IPG which was placed in the pocket after securing connections. Impedances were checked. Wound was irrigated  with vancomycin. Then irrigated once more.  Incision was closed with 2-0 Vicryl and 3-0 vicryl sutures and dressed with a sterile occlusive dressing.  Counts were correct at the end of the case.  PLAN OF CARE: Extended recovery  PATIENT DISPOSITION:  PACU - hemodynamically stable.   Delay start of Pharmacological VTE agent (>24hrs) due to surgical blood loss or risk of bleeding: yes

## 2017-03-25 NOTE — Op Note (Signed)
03/25/2017  9:41 AM  PATIENT:  Casey Tucker  76 y.o. male  PRE-OPERATIVE DIAGNOSIS:  Parkinson's Disease with depleted Implanted Pulse Generator for Left STN DBS electrode  POST-OPERATIVE DIAGNOSIS:   Parkinson's Disease with depleted Implanted Pulse Generator for Left STN DBS electrode  PROCEDURE:  Procedure(s) with comments: Deep Brain Stimulator battery replacement (Left) - left  SURGEON:  Surgeon(s) and Role:    Erline Levine, MD - Primary  PHYSICIAN ASSISTANT:   ASSISTANTS: Poteat, RN   ANESTHESIA:   general  EBL:  No intake/output data recorded.  BLOOD ADMINISTERED:none  DRAINS: none   LOCAL MEDICATIONS USED:  MARCAINE    and LIDOCAINE   SPECIMEN:  No Specimen  DISPOSITION OF SPECIMEN:  N/A  COUNTS:  YES  TOURNIQUET:  * No tourniquets in log *  DICTATION: DICTATION: Patient has implanted subthalamic stimulator electrode and IPG, which is now depleted.  It was elected for patient to undergo IPG revision.  PROCEDURE: Patient was brought to the operating room and given general anesthesia.  Left upper chest was prepped with betadine scrub and Duraprep.  Area of planned incision was infiltrated with lidocaine.  Prior incision was reopened and the old IPG was externalized.  New IPG which was placed in the pocket after securing connections. Impedances were checked. Wound was irrigated  with vancomycin. Then irrigated once more.  Incision was closed with 2-0 Vicryl and 3-0 vicryl sutures and dressed with a sterile occlusive dressing.  Counts were correct at the end of the case.  PLAN OF CARE: Extended recovery  PATIENT DISPOSITION:  PACU - hemodynamically stable.   Delay start of Pharmacological VTE agent (>24hrs) due to surgical blood loss or risk of bleeding: yes

## 2017-03-26 ENCOUNTER — Encounter (HOSPITAL_COMMUNITY): Payer: Self-pay | Admitting: Neurosurgery

## 2017-03-29 ENCOUNTER — Telehealth: Payer: Self-pay | Admitting: Neurology

## 2017-03-29 NOTE — Telephone Encounter (Signed)
Spoke to patient. He wanted to confirm to keep appt 06/23/17 w/ Dr. Carles Collet. Or if need to move appt up earlier. Advised pt ok to keep scheduled appt. Patient requested to know if he should take dressing off in 5 days Advised pt to contact Dr. Melven Sartorius office since they did the surgery. Pt verbalized understanding.

## 2017-03-29 NOTE — Telephone Encounter (Signed)
Patient needs to talk to someone about his appt that will be coming up and also the dressing for the DBS please call him back today

## 2017-03-30 ENCOUNTER — Other Ambulatory Visit: Payer: Self-pay | Admitting: Internal Medicine

## 2017-03-30 NOTE — Telephone Encounter (Signed)
Last Rx 01/26/2017. Last OV 04/2016. pls advise

## 2017-03-31 NOTE — Telephone Encounter (Signed)
Approved: #60 x 0 

## 2017-03-31 NOTE — Telephone Encounter (Signed)
Verbal refill given to Rob at pharmacy.

## 2017-04-05 ENCOUNTER — Other Ambulatory Visit: Payer: Self-pay | Admitting: Neurology

## 2017-04-13 DIAGNOSIS — Z23 Encounter for immunization: Secondary | ICD-10-CM | POA: Diagnosis not present

## 2017-05-03 ENCOUNTER — Other Ambulatory Visit: Payer: Self-pay | Admitting: Internal Medicine

## 2017-05-03 ENCOUNTER — Other Ambulatory Visit: Payer: Self-pay | Admitting: Neurology

## 2017-05-03 NOTE — Telephone Encounter (Signed)
Last filled 03-31-17 #60 Last OV 05-05-16 Next OV 05-11-17

## 2017-05-04 NOTE — Telephone Encounter (Signed)
Approved: #60 x 0 

## 2017-05-04 NOTE — Telephone Encounter (Signed)
Verbal refill given to Casey Tucker at the pharmacy

## 2017-05-11 ENCOUNTER — Encounter: Payer: Self-pay | Admitting: Internal Medicine

## 2017-05-11 ENCOUNTER — Ambulatory Visit (INDEPENDENT_AMBULATORY_CARE_PROVIDER_SITE_OTHER): Payer: Medicare Other | Admitting: Internal Medicine

## 2017-05-11 VITALS — BP 128/62 | HR 68 | Temp 97.5°F | Ht 70.25 in | Wt 159.5 lb

## 2017-05-11 DIAGNOSIS — G20 Parkinson's disease: Secondary | ICD-10-CM | POA: Diagnosis not present

## 2017-05-11 DIAGNOSIS — Z1211 Encounter for screening for malignant neoplasm of colon: Secondary | ICD-10-CM

## 2017-05-11 DIAGNOSIS — I350 Nonrheumatic aortic (valve) stenosis: Secondary | ICD-10-CM | POA: Diagnosis not present

## 2017-05-11 DIAGNOSIS — Z0000 Encounter for general adult medical examination without abnormal findings: Secondary | ICD-10-CM | POA: Diagnosis not present

## 2017-05-11 DIAGNOSIS — Z7189 Other specified counseling: Secondary | ICD-10-CM

## 2017-05-11 DIAGNOSIS — K219 Gastro-esophageal reflux disease without esophagitis: Secondary | ICD-10-CM

## 2017-05-11 NOTE — Assessment & Plan Note (Signed)
See social history 

## 2017-05-11 NOTE — Progress Notes (Signed)
Subjective:    Patient ID: Casey Tucker, male    DOB: [birth year], 76 y.o.   MRN: [id number]  HPI Here for Medicare wellness visit and follow up of chronic health conditions Reviewed form and advanced directives Reviewed other doctors No tobacco Occasional drink of alchol Stays active with hunting and fishing. Does kick boxing routine regularly No depression or anhedonia Vision is fine since cataract surgery Hearing is poor--considering aides now No apparent memory issues  Ongoing issues with Parkinson's  Continues to see Dr Tat Had DBS battery replaced Considering other alternative meds that have come out Functionally doing okay Remains independent with instrumental ADLs Still falls at times--- balance better with kick boxing and has "learned to fall". No injuries  No chest pain No SOB No dizziness or syncope No edema No palpitations  No heartburn  No dysphagia Will use OTC meds prn (ranitidine)---perhaps monthly   Current Outpatient Prescriptions on File Prior to Visit  Medication Sig Dispense Refill  . amantadine (SYMMETREL) 100 MG capsule TAKE 1 CAPSULE BY MOUTH 3 TIMES DAILY 90 capsule 5  . carbidopa-levodopa (SINEMET CR) 50-200 MG tablet TAKE 1 TABLET BY MOUTH DAILY AS NEEDED 90 tablet 0  . carbidopa-levodopa (SINEMET IR) 25-100 MG tablet Take 1 tablet by mouth 4 (four) times daily. 730 a, 11 a, 2 p and 6 p    . clonazePAM (KLONOPIN) 0.5 MG tablet TAKE 1-2 TABLETS BY MOUTH AT BEDTIME AS NEEDED 60 tablet 0  . cyclobenzaprine (FLEXERIL) 10 MG tablet TAKE 2 TABLETS BY MOUTH AT BEDTIME 60 tablet 2  . entacapone (COMTAN) 200 MG tablet TAKE 1 TABLET BY MOUTH TWO TIMES DAILY 180 tablet 0  . pramipexole (MIRAPEX) 1 MG tablet TAKE 1 TABLET BY MOUTH 3 TIMES DAILY 90 tablet 5   No current facility-administered medications on file prior to visit.     Allergies  Allergen Reactions  . Amoxicillin   . Celebrex [Celecoxib]   . Demerol Other (See Comments)   Hallucinations   . Penicillins     REACTION: RASH    Past Medical History:  Diagnosis Date  . Arthritis    left hip   . GERD (gastroesophageal reflux disease) Pre 2002   Mild  . History of kidney stones   . 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 Parkinson disease ([place]) 2004   Slowly progressive  . Ruptured appendix teens    Past Surgical History:  Procedure Laterality Date  . APPENDECTOMY    . CATARACT EXTRACTION W/ INTRAOCULAR LENS  IMPLANT, BILATERAL  2017  . DEEP BRAIN STIMULATOR PLACEMENT  8/14   L STN  . DOPPLER ECHOCARDIOGRAPHY  01/10/2009   LV NML Mild LVH EF 60-65% aortic sclerosis w/0 stenosis   . JOINT REPLACEMENT     left hip replacement 08/14/11/[place]   . Neuro evaluation  12/19/2004   [name], benign tremor  . SUBTHALAMIC STIMULATOR BATTERY REPLACEMENT Left 03/25/2017   Procedure: Deep Brain Stimulator battery replacement;  Surgeon: Erline Levine, MD;  Location: [facility];  Service: Neurosurgery;  Laterality: Left;  left  . TONSILLECTOMY    . TOTAL HIP REVISION  08/27/2011   Procedure: TOTAL HIP REVISION;  Surgeon: Mauri Pole, MD;  Location: [facility];  Service: Orthopedics;  Laterality: Left;    Family History  Problem Relation Age of Onset  . Arthritis Mother        knee replacement  . COPD  Father        emphysema, smoker  . Heart disease Father        CHF  . Alcohol abuse Paternal Uncle     Social History   Social History  . Marital status: Married    Spouse name: N/A  . Number of children: 2  . Years of education: N/A   Occupational History  . Optometrist     medically retired   Social History Main Topics  . Smoking status: Never Smoker  . Smokeless tobacco: Never Used  . Alcohol use 3.5 oz/week    7 Standard drinks or equivalent per week     Comment: occassionally  . Drug use: No  . Sexual activity: Yes   Other Topics Concern  . Not on file   Social  History Narrative   Married, lives with wife   2 daughters      Has living will   Wife has health care POA---then daughters   Would still accept CPR--but no prolonged artificial means (ventilator or tube feeds)   Review of Systems  Appetite is okay-down from the past Weight fairly stable Sleeps fairly good Some nocturia. Flow is okay. No incontinence Wears seat belt No arthritis problems Bowels are slow--uses stool softener. No blood No rash other than darkening on hands---bruising on dorsum of hands from past injury with woodworking    Objective:   Physical Exam  Constitutional: He is oriented to person, place, and time. He appears well-developed. No distress.  HENT:  Mouth/Throat: Oropharynx is clear and moist. No oropharyngeal exudate.  Neck: No thyromegaly present.  Cardiovascular: Normal rate, regular rhythm and intact distal pulses.  Exam reveals no gallop.   Soft aortic systolic murmur  Pulmonary/Chest: Effort normal and breath sounds normal. No respiratory distress. He has no wheezes. He has no rales.  Abdominal: He exhibits no distension. There is no tenderness. There is no rebound and no guarding.  Musculoskeletal: He exhibits no edema or tenderness.  Lymphadenopathy:    He has no cervical adenopathy.  Neurological: He is alert and oriented to person, place, and time.  President--- "Dwaine Deter, Bush" [phone] (long [phone] [id number] Recall 1/3    Skin: No rash noted. No erythema.  Psychiatric: He has a normal mood and affect. His behavior is normal.          Assessment & Plan:

## 2017-05-11 NOTE — Assessment & Plan Note (Signed)
Doing fairly well with DBS Considering new Rx Seems to have some mild cognitive changes from this

## 2017-05-11 NOTE — Assessment & Plan Note (Signed)
I have personally reviewed the Medicare Annual Wellness questionnaire and have noted 1. The patient's medical and social history 2. Their use of alcohol, tobacco or illicit drugs 3. Their current medications and supplements 4. The patient's functional ability including ADL's, fall risks, home safety risks and hearing or visual             impairment. 5. Diet and physical activities 6. Evidence for depression or mood disorders  The patients weight, height, BMI and visual acuity have been recorded in the chart I have made referrals, counseling and provided education to the patient based review of the above and I have provided the pt with a written personalized care plan for preventive services.  I have provided you with a copy of your personalized plan for preventive services. Please take the time to review along with your updated medication list.  Will do FIT No PSA due to age Working on fitness Had flu vaccine

## 2017-05-11 NOTE — Assessment & Plan Note (Signed)
No apparent symptoms 

## 2017-05-11 NOTE — Assessment & Plan Note (Signed)
Only occasional symptoms

## 2017-05-19 ENCOUNTER — Other Ambulatory Visit: Payer: Self-pay | Admitting: Neurology

## 2017-05-21 ENCOUNTER — Other Ambulatory Visit (INDEPENDENT_AMBULATORY_CARE_PROVIDER_SITE_OTHER): Payer: Medicare Other

## 2017-05-21 DIAGNOSIS — Z1211 Encounter for screening for malignant neoplasm of colon: Secondary | ICD-10-CM | POA: Diagnosis not present

## 2017-05-21 LAB — FECAL OCCULT BLOOD, IMMUNOCHEMICAL: Fecal Occult Bld: NEGATIVE

## 2017-05-25 ENCOUNTER — Encounter: Payer: Self-pay | Admitting: *Deleted

## 2017-06-03 ENCOUNTER — Other Ambulatory Visit: Payer: Self-pay | Admitting: Neurology

## 2017-06-11 ENCOUNTER — Other Ambulatory Visit: Payer: Self-pay | Admitting: Internal Medicine

## 2017-06-11 ENCOUNTER — Other Ambulatory Visit: Payer: Self-pay | Admitting: Neurology

## 2017-06-14 NOTE — Telephone Encounter (Signed)
Verbal refill given to Amy at the pharmacy

## 2017-06-14 NOTE — Telephone Encounter (Signed)
Approved: #60 x 0 

## 2017-06-14 NOTE — Telephone Encounter (Signed)
Last filled 05-04-17 #60. Last OV 05-11-17 No Future OV

## 2017-06-22 NOTE — Progress Notes (Signed)
`   Casey Tucker was seen today in the movement disorders clinic for neurologic consultation at the request of Casey Carbon, MD.  The consultation is for the evaluation of PD.  The records that were made available to me were reviewed.  Pt is accompanied by his wife who supplements the history.   Pt reports first sx was in 2006.  Pt is R hand dominant.  Pt states that his next door neighbor (Network engineer) noted that he wasn't swinging his arm and thought that he had a stroke. He subsequently saw [name] in about 2006 and he was dx with PD.  He was started on mirapex and azilect.  It helped the overall stiffness.  Tremor developed in the R arm around the same time as the diagnosis.  Levodopa was started in approximately 2009-2010.  He also started an exercise program and he believes that has made a huge difference.  He began to have some motor fluctuations and saw [name] and had his surgery done in 02/2013.  It was a unilateral implant, placed into the L STN.  He has noted that he is sleeping much better since surgery.  No tremor.  [name] programmed the DBS one week post op.  He f/u with the NP at [place], and he now has an A and a B setting.  He hates the A setting (doesn't walk well) and he "cranked" his own B setting up to 280 (assuming frequency) and this helped walking some but he still has problems with balance and freezing.  He is on mirapex, 1 mg tid.  He is on carbidopa/levodopa 25/100, 7:30am/11am/2pm/6pm.    11/15/13:  Pt returns today for follow up.  Overall, he has been doing well.  He is off of medication this AM for programming.  His wife reports that he has done better on his new setting.  Seems to be walking better with less dyskinesia.  Until this morning, he was still on the carbidopa/levodopa 25/100, 1 tablet 4 times per day, Azilect 1 mg daily and Mirapex 1 mg 3 times per day.  He is exercising faithfully.  He has not had any falls.  He has been shuffling a little more this  morning, since he is off of medication.  11/21/13 update:  The patient is returning for followup, earlier than expected.  The patient reports that he really had not been doing as well over the weekend.  He did not feel like he was walking as well. He felt like the setting "wore" off 30 mins before the next levodopa dosage.  His wife wonders if he wasn't doing as well because he was at the beach this past weekend and there were more stairs to negotiate.  No falls.  No hallucinations   He did decrease his carbidopa/levodopa 25/100, one tablet 3 times per day from one tablet 4 times per day.  He is still on Mirapex 1 mg 3 times per day and Azilect 1 mg daily.  12/20/13 update:  The patient returns today for followup, accompanied by his wife and daughter who supplements the history.  The patient is currently on Mirapex, 1 mg 3 times a day, Azilect 1 mg daily and carbidopa/levodopa 25/100 at 7:30 AM/11 AM/2 PM and 6:30 PM.  Overall, the patient states that he is doing much better on his current setting.  It is not seeming to "wear off."  He is having some difficulty in the middle of the night when he  gets up and sometimes has to use a cane, but otherwise is doing well.  No falls.  On one occasion, he did try to get up in the morning and go and take a walk and was able to walk a few miles without taking his medication, but then he needed it when he got home.  He has noticed some dyskinesia, but it has not been particularly bothersome.  He would like to decrease dyskinesia, if able.  03/20/14 update:  Pt is f//u today.  Overall doing well.  He tried to decrease his carbidopa/levodopa 25/100 from one tablet 4 times per day to 1/0.5/0.5/1, but ended up going back up to one tablet 4 times per day.  He finds that he is due for his second dose at 11 AM, but by 10:30 AM he is shuffling, and he thinks it is primarily the right foot.  When he goes hiking, he states that he takes an extra half tablet at 10:30 AM and as well and  often takes another half tablet in the afternoon.  He finds that protein interferes with the levodopa dosing.  He continues to take 2 Flexeril at night.  He does not know if it helps, but has not tried to stop that since the DBS surgery.  He finds that the only time he has dyskinesia is at the end of the dose.  He asks me if we can slightly increase the DBS stimulation.  He continues to exercise.  No falls.  No hallucinations.  05/18/14 update: The patient is following up today, accompanied by his wife who supplements the history.  I tried to slightly decrease the levodopa, but the patient is was not able to do that.  He remains on carbidopa/levodopa 25/100, 1 tablet 4 times per day.  He is also on Mirapex 1 mg 3 times per day along with Azilect 1 mg daily. C/o some "slight" freezing episodes and med wearing off 1/2 hr before due but at the same time having frequent dyskinesia of the R leg.  No falls.  Staying very active.  No hallucinations.  C/o cost of azilect and asks about its benefit.    08/08/14 update:  Overall, the patient is doing well.  He has not taken medication since 6:30 this morning, and wanted me to see him off of medication.  He states that he has some "spotty freezing."  He asks about potentially taking metformin to help him.  He also asks about levodopa nasal spray.  He asks about several other experimental treatments.  He has occasional dyskinesia in the right foot but it is not bothersome to him.  Overall, he feels slightly slower than he used to.  He states that he generally is supposed to take his levodopa at 11 AM but he needs it by 10:30.  If he is very active he will need to take a few extra half dosages of levodopa throughout the day.  Generally, however, he takes 4 tablets of carbidopa/levodopa 25/100 throughout the day in addition to entacapone 200 mg twice a day and Mirapex 1 mg 3 times a day.  He is off of Azilect and wants to know if he should go back on it.  He noticed no  difference when discontinuing it.  He is off of Flexeril as well as noticed no trouble with that.  10/10/14 update:  Overall, the patient is doing well.  He is on carbidopa/levodopa 25/100 4 times per day.  He takes entacapone 200 mg with  the first and third dose of the levodopa.  He really does not notice that it helps and states that he still feels "jerky" 30 minutes before his next dosage is due.  He will often just take the levodopa and then.  He is on Mirapex 1 mg 3 times a day.  I added carbidopa/levodopa 50/200 CR at night because of cramping and the patient states that it helps but he rarely takes it because he thinks the dyskinesia that worse when he did it.  He states that he uses it if he really needs to "get up and go."  He does use Flexeril at night for cramping and thinks it helped as much as the carbidopa/levodopa 50/200 CR.  He did turn down the DBS device somewhat since last visit because of dyskinesia.  He states that he takes Azilect "off and on" because he was told by a previous neurologist that it was neuroprotective.  He asks me about isradapine.  02/12/15 update:  The patient returns today for follow-up.  I have communicated with him several times since last visit.  He called with complaints of freezing or before his next dose and we ended up slightly increasing his levodopa so that he is taking 1-1/2 tablets in the morning, one tablet at his next dose, 1-1/2 tablets in the mid afternoon and then 1 tablet in the later evening.  This helped but he states that sometimes he will take 1 in the AM and the 1/2 an hour or 2 later.  He still only takes 3-4 a day.  He takes entacapone with every other dose of the levodopa.  He is on pramipexole 1 mg 3 times per day.  Dyskinesia has been problematic despite the fact he complains about freezing and we ended up adding amantadine, 100 mg 3 times a day for that. It helped that but caused constipation and dry mouth.   He had 2 falls since last visit, one  of which was at the outer edge of my office when he came to bring me something.  He quickly turned around while carrying something and fell to his knee.  He did not get hurt.   He states that if he gets in a crowded space his feet will just get tangled together.   He does know that if he gets into "shuffle mode" he needs to stop and "reset."  He states overall shuffling is much better.  He asked me about several studies since our last visit, but these are not available to him because of the fact he has already had DBS.  He asks me again about nilotinib study.  05/15/15 update:  The patient is following up today regarding his Parkinson's disease.  States that he has been "fantastic."  He did have a postoperative MRI done since our last visit.  This is first he has ever had since his surgery.  The [facility] plane is approximately at contact number 2 on the lead.  We are currently actively using contact 1.  We have used contact 2 in the past successfully.  He is currently on carbidopa/levodopa 25/100; he has gone down a bit on this to about 4 and sometimes 4.5 tablets a day.  Is on Comtan 200 mg twice a day, Mirapex 1 mg 3 times a day and amantadine 100 mg 3 times a day for dyskinesia.  If he gets nervous, his legs will "twitch."  He is exercising on the bike in addition to doing physical labor.  Dropped his own programming to 3.2 and thinks that it has helped.  Did have 1-2 falls when "I get in a hurry."  Never got hurt.  Notices more trouble when he is at end of dosage.  08/15/15 update:  The patient is following up today regarding his Parkinson's disease.  He is on carbidopa/levodopa 25/100, 4- tablets per day (and then will take 2 extra 1/2 tablets as needed) along with entacapone, 200 mg twice a day.  He also takes pramipexole, 1 mg 3 times per day and amantadine 100 mg 3 times a day for dyskinesia.   Amantadine is causing dry mouth and he is using biotene to help that.   He has called several times since last  visit asking me about medical marijuana and its place in the treatment of Parkinson's disease.  I have talked to him about the position statement by the [employer].  He has not fallen since our last visit.  He reports increased dyskinesia only when he has increased stress.  Reports that he changed his DBS setting again back to where I had it previously (3.4).  He uses 2 flexeril at night for cramping.  He brings me articles to read on natural "supplements" and also asks me to look up a center in [place] that is owned by a "chiropractic neurologist" and has week long "camps" for patient to have Parkinson's disease that are supposed to help with healing of Parkinson's disease and not paid by insurance.  12/13/15 update:  The patient is following up today regarding his Parkinson's disease.  He is on carbidopa/levodopa 25/100, 4-5 tablets per day along with entacapone, 200 mg twice a day.  He also takes pramipexole, 1 mg 3 times per day and amantadine 100 mg 3 times a day for dyskinesia.  I reviewed records since last visit.  Fell at home on concrete on 11/30/15 and injured L calf.  Saw PCP few days later. Is better now.  States that he has learned that he just has to "stop" when he has a festinating gait.   No hallucinations.  No lightheadedness.    04/28/16 update:  He is on carbidopa/levodopa 25/100, 4-5 tablets per day (usually 4) along with entacapone, 200 mg twice a day.  He also takes pramipexole, 1 mg 3 times per day and amantadine 100 mg 3 times a day for dyskinesia.  He had started taking carbidopa/levodopa 50/200, one tablet in the AM as he thinks that this "gives me a 3 hour window before it is done."  He usually doesn't take it but sometimes he does.    This is odd dosing but he has rearranged his own medication to best suite him.  He has been physical therapy since our last visit.  He does think that it was helpful.  A U Step walker was recommended.  He isn't using it while out and  asks me what he can do for falls.  He does state that "my biggest falls are at home."  He does keep the walker next to him at bedtime because he finds that this is the most troublesome time when he gets up.  He finds that the top of his body gets ahead of the legs and that is why he falls.  Noting some trouble moving in confined spaces.  Reports that "I changed the DBS to 3.2 to see if it would help" but it didn't.  He is going to trainer 4-5 days a week.  Asks  if okay to hunt quail/fish if with someone.    09/01/16 update:  The patient returns today for follow-up.  He is on carbidopa/levodopa 25/100, 4 tablets throughout the day (and then 50% of the time he will add 1/2 extra tablet bid) and entacapone, 200 mg twice per day.  He also takes carbidopa/levodopa 50/200, one tablet in the morning prn.  He remains on Mirapex, 1 mg 3 times a day and amantadine 100 mg 3 times a day, for dyskinesia.  He did call me after our last visit and stated that he was falling, but admitted that he was not using his walker.  He was encouraged to start doing so.    He states today "I am not going to use that thing except at night."  States that he reduced his brain stimulator and "that didn't work so I went back up."  He has been going to rock steady boxing and thought that had helped.  Takes klonopin 0.5 mg for RBD and that is working well.  Needs a refill  12/28/16 update:  The patient returns today for follow-up, accompanied by his wife and daughter who supplement the history.  He is on carbidopa/levodopa 25/100, 4 tablets throughout the day (and then 50% of the time he will add 1/2 extra tablet bid) and entacapone, 200 mg twice per day.  He also takes carbidopa/levodopa 50/200, one tablet at night.  He remains on Mirapex, 1 mg 3 times a day (was going to try and drop that but had trouble cutting that one) and amantadine 100 mg 3 times a day, for dyskinesia.  He is doing boxing 3 days a week.  He asks me again about the [employer]  device.  States that he increased the DBS and when asked why he stated that he was hoping it would help his freezing/walking/balance.  02/24/17 update:  Patient seen today in follow-up, accompanied by his wife who supplements the history.  Patient is on carbidopa/levodopa, 25/100, 4 tablets during the day and entacapone 200 mg twice per day.  He takes 2 extra 1/2 tablets of carbidopa/levodopa 25/100 during the day as needed.   He takes carbidopa/levodopa 50/200 at bedtime.  Sometimes he takes 1/2 of a time released tablet.   Last visit, I told him he could try and reduce his pramipexole from 1 mg 3 times per day to 0.5 mg 3 times per day.  He states that he did that and noted no difference.  He remains on amantadine 100 mg 3 times per day.  He has had no hallucinations.  He continues to exercise with boxing and he loves it.  In regards to falls, his wife states that he is falling more.  He won't use the walker.  He is on klonopin for RBD and "I take one or two" and "it works really well."  He has noted last week his battery has reached "ERI."  06/23/17 update:  Pt seen in f/u for PD.  This patient is accompanied in the office by his spouse who supplements the history.  Patient is on carbidopa/levodopa 25/100, 4 tablets in the day.  He takes entacapone with 2 of these dosages.  He is on carbidopa/levodopa 50/200 at bedtime.  He sometimes takes this bid (one in the AM and one in PM) Last visit, I gave him a weaning schedule to get off of pramipexole.  He states that "I take a little here and there."  He is only taking 1/2 tablet prn per  patient, but then when he gives me a schedule of how he takes his medication, he includes 3 pramipexole per day.  He is still on amantadine, 100 mg 3 times per day.  He is on clonazepam 0.5 mg, 1-2 tablets at night for REM behavior disorder.  He has not had any acting out of the dreams.  He asks about freezing.  He doesn't like to use the walker because he doesn't want to be dependent  on it.  He had to give up hunting because of falls.  He is doing well with RSB.  He asks about cannabis oil.  He asks me about giving him IV thiamine.    PREVIOUS MEDICATIONS: Sinemet, Mirapex and azilect  ALLERGIES:   Allergies  Allergen Reactions  . Amoxicillin   . Celebrex [Celecoxib]   . Demerol Other (See Comments)    Hallucinations   . Penicillins     REACTION: RASH    CURRENT MEDICATIONS:  Current Outpatient Medications on File Prior to Visit  Medication Sig Dispense Refill  . amantadine (SYMMETREL) 100 MG capsule TAKE 1 CAPSULE BY MOUTH 3 TIMES DAILY 90 capsule 5  . carbidopa-levodopa (SINEMET CR) 50-200 MG tablet TAKE 1 TABLET BY MOUTH DAILY AS NEEDED 90 tablet 0  . carbidopa-levodopa (SINEMET IR) 25-100 MG tablet TAKE 1 TABLET BY MOUTH 4 TIMES DAILY AND1/2 TABLET 1-2 TIMES DAILY 150 tablet 5  . clonazePAM (KLONOPIN) 0.5 MG tablet TAKE 1-2 TABLETS BY MOUTH BEDTIME AS NEEDED 60 tablet 0  . cyclobenzaprine (FLEXERIL) 10 MG tablet TAKE 2 TABLETS BY MOUTH AT BEDTIME 60 tablet 2  . entacapone (COMTAN) 200 MG tablet TAKE 1 TABLET BY MOUTH TWICE DAILY 180 tablet 0  . pramipexole (MIRAPEX) 1 MG tablet TAKE 1 TABLET BY MOUTH 3 TIMES DAILY 90 tablet 5   No current facility-administered medications on file prior to visit.     PAST MEDICAL HISTORY:   Past Medical History:  Diagnosis Date  . Arthritis    left hip   . GERD (gastroesophageal reflux disease) Pre 2002   Mild  . History of kidney stones   . 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 Parkinson disease ([place]) 2004   Slowly progressive  . Ruptured appendix teens    PAST SURGICAL HISTORY:   Past Surgical History:  Procedure Laterality Date  . APPENDECTOMY    . CATARACT EXTRACTION W/ INTRAOCULAR LENS  IMPLANT, BILATERAL  2017  . DEEP BRAIN STIMULATOR PLACEMENT  8/14   L STN  . DOPPLER ECHOCARDIOGRAPHY  01/10/2009   LV NML Mild LVH EF  60-65% aortic sclerosis w/0 stenosis   . JOINT REPLACEMENT     left hip replacement 08/14/11/[place]   . Neuro evaluation  12/19/2004   [name], benign tremor  . SUBTHALAMIC STIMULATOR BATTERY REPLACEMENT Left 03/25/2017   Procedure: Deep Brain Stimulator battery replacement;  Surgeon: Erline Levine, MD;  Location: [facility];  Service: Neurosurgery;  Laterality: Left;  left  . TONSILLECTOMY    . TOTAL HIP REVISION  08/27/2011   Procedure: TOTAL HIP REVISION;  Surgeon: Mauri Pole, MD;  Location: [facility];  Service: Orthopedics;  Laterality: Left;    SOCIAL HISTORY:   Social History   Socioeconomic History  . Marital status: Married    Spouse name: Not on file  . Number of children: 2  . Years of education: Not on file  . Highest education level: Not on  file  Social Needs  . Financial resource strain: Not on file  . Food insecurity - worry: Not on file  . Food insecurity - inability: Not on file  . Transportation needs - medical: Not on file  . Transportation needs - non-medical: Not on file  Occupational History  . Occupation: Optometrist    Comment: medically retired  Tobacco Use  . Smoking status: Never Smoker  . Smokeless tobacco: Never Used  Substance and Sexual Activity  . Alcohol use: Yes    Alcohol/week: 3.5 oz    Types: 7 Standard drinks or equivalent per week    Comment: occassionally  . Drug use: No  . Sexual activity: Yes  Other Topics Concern  . Not on file  Social History Narrative   Married, lives with wife   2 daughters      Has living will   Wife has health care POA---then daughters   Would still accept CPR--but no prolonged artificial means (ventilator or tube feeds)    FAMILY HISTORY:   Family Status  Relation Name Status  . Mother  Deceased at age 46       knee replacement  . Father  Deceased at age 12       tremor  . Sister  Stage manager in [place], [place]  . Annamarie Major  (Not Specified)    ROS:  A complete 10 system review of  systems was obtained and was unremarkable apart from what is mentioned above.  PHYSICAL EXAMINATION:    VITALS:   Vitals:   06/23/17 1056  BP: (!) 144/70  Pulse: 82  SpO2: 97%  Weight: 164 lb (74.4 kg)  Height: 6' (1.829 m)   Wt Readings from Last 3 Encounters:  06/23/17 164 lb (74.4 kg)  05/11/17 159 lb 8 oz (72.3 kg)  03/25/17 162 lb (73.5 kg)     GEN:  The patient appears stated age and is in NAD. HEENT:  Normocephalic, atraumatic.  The mucous membranes are moist. The superficial temporal arteries are without ropiness or tenderness. CV: RRR Lungs:  CTAB Neck: no bruits   Neurological examination:  Orientation: The patient is alert and oriented x3.  Cranial nerves: There is good facial symmetry.  There is minimal facial hypomimia.  Pupils are equal round and reactive to light bilaterally.  The speech is slightly dysarthric but fluent. Soft palate rises symmetrically and there is no tongue deviation. Hearing is intact to conversational tone. Motor: Strength is 5/5 in the bilateral upper and lower extremities.   Shoulder shrug is equal and symmetric.  There is no pronator drift.  Movement examination: Tone: There is normal tone in the bilateral upper extremities.  The tone in the lower extremities is normal.  Abnormal movements: There is significant dyskinesia in the R leg (same as previous visits) Coordination:  There is decreased RAM's with any form of RAMS, including alternating supination and pronation of the forearm, hand opening and closing, finger taps, heel taps and toe taps, R more than L Gait and Station: The patient has no difficulty arising out of a deep-seated chair and he festinates.    He freezes in the doorway.  He freezes in the turns.  He uses his cane to ambulate.  When given a walker, the patient actually walks really well and does not freeze at all, including in the doorway.  LABS    Chemistry      Component Value Date/Time   NA 139  03/23/2017 1442    NA 142 08/11/2016 1701   NA 139 08/15/2011 0535   K 4.2 03/23/2017 1442   K 3.8 08/15/2011 0535   CL 106 03/23/2017 1442   CL 101 08/15/2011 0535   CO2 28 03/23/2017 1442   CO2 29 08/15/2011 0535   BUN 17 03/23/2017 1442   BUN 18 08/11/2016 1701   BUN 12 08/15/2011 0535   CREATININE 0.82 03/23/2017 1442   CREATININE 0.73 08/15/2011 0535      Component Value Date/Time   CALCIUM 8.8 (L) 03/23/2017 1442   CALCIUM 7.6 (L) 08/15/2011 0535   ALKPHOS 86 08/11/2016 1701   AST 17 08/11/2016 1701   ALT 19 08/11/2016 1701   BILITOT 1.2 08/11/2016 1701     Lab Results  Component Value Date   TSH 2.82 03/16/2012    DBS programming was performed today which is described in more detail on a separate programming procedural notes.    ASSESSMENT/PLAN:  1. Parkinson's disease.    -The patient is status post left STN DBS at [facility] on 03/14/2013.  His battery is ERI  -remain on the carbidopa/levodopa 25/100,  4-5 tablets/day and comtan 200 mg bid.  He is taking carbidopa/levodopa 50/200, 1 daily.  Told him not to split this.  -Encouraged the patient to go ahead and try and discontinue the Mirapex.  He is only taking it as needed now and it really does not work that way.   - Continue amantadine 100 mg tid, which is helping dyskinesia.  He has some dry mouth with the amantadine, but uses Biotene.  -I did talk to patient re: diagnosis.  I would have loved to seen him early on in the disease.  By definition, patients with PD have to have sx's on the "good" side of the body within 3 years of diagnosis, and he has never had sx's on the opposite side.  This has really called into question, for me, the diagnosis but I cannot deny that he has done much better post DBS (per his reports)  -needs to use a walker.  Discussed this many times.  Showed the patient and his wife that this absolutely helps the freezing and freezing is the source of his falls.  He had severe is freezing when he first came in today  without the walker, and no freezing with a walker.  -Discussed with the patient that I do not want him hunting.  He asked me to several times a day.  -doing well in rock steady boxing  The patient asked me about CBD oil.  Discussed literature on that as it relates to Parkinson's disease.  Not recommended by [employer] because of lack of controlled trials.  Talked about smaller trials in which marijuana helped tremor.  However, there is some data that suggests that it worsens cognition and falls.  The data also suggests that CBD oil is less effective than marijuana.  However, there have been concerns given the fact that CBD oil is unregulated and each manufacturer has different amounts of ingredient and the purity of each manufacturers ingredient has been called into question.  At this time, it is not recommended for the treatment of Parkinson's disease.  Further studies do need to be completed.  -He asked me about whether or not I would give him IV thiamine as he read about an open label trial for this and Parkinson's disease.  I told him that I do not recommend this.  -He met with our new  social worker today 2.  Constipation  -Was made worse by amantadine.  He is using the rancho recipe.  He is using Biotene for the dry mouth but thinks that is from the amantadine as well and it may be. 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  Follow up is anticipated in the next few months, sooner should new neurologic issues arise.  Much greater than 50% of this visit was spent in counseling and coordinating care.  Total face to face time:  40 min

## 2017-06-23 ENCOUNTER — Encounter: Payer: Self-pay | Admitting: Neurology

## 2017-06-23 ENCOUNTER — Encounter: Payer: Self-pay | Admitting: Psychology

## 2017-06-23 ENCOUNTER — Ambulatory Visit (INDEPENDENT_AMBULATORY_CARE_PROVIDER_SITE_OTHER): Payer: Medicare Other | Admitting: Neurology

## 2017-06-23 VITALS — BP 144/70 | HR 82 | Ht 72.0 in | Wt 164.0 lb

## 2017-06-23 DIAGNOSIS — G20 Parkinson's disease: Secondary | ICD-10-CM

## 2017-06-23 DIAGNOSIS — G4752 REM sleep behavior disorder: Secondary | ICD-10-CM

## 2017-06-23 DIAGNOSIS — G249 Dystonia, unspecified: Secondary | ICD-10-CM

## 2017-06-23 NOTE — Procedures (Signed)
DBS Programming was performed.    Total time spent reviewing programming was 10 minutes.  Device was confirmed to be on.  Soft start was confirmed to be on.  Impedences were checked and were within normal limits.   All impedances were well within normal limits and are recorded on a separate worksheet.  Battery was checked and was determined to be functioning normally (2.96).  Final settings were as follows:  Left brain electrode, Group A:     2-C+; Amplitude  3.5   V   ; Pulse width 60 microseconds;   Frequency   130   Hz.  Left brain electrode, group B:  1-C+, amplitude: 3.2 V, PW: 90, frequency 130  Group B was active at the time pt left the office.  Right brain electrode:     n/a

## 2017-06-23 NOTE — Progress Notes (Signed)
I met with the patient and his wife today. We talked a little about using a walker regularly for independence and safety. In addition, we talked a little about  His desire to go hunting with his grandson. We talked about this from a perspective of finding modifications, such as being seated, etc. With the primary goal being his safety and being able to participate in a activity that is important to him. He shared that at the next opportunity he has to go hunting, he will check in to make sure the accommodations will help him be safe while he is participating.

## 2017-06-29 ENCOUNTER — Other Ambulatory Visit: Payer: Self-pay | Admitting: Neurology

## 2017-07-01 ENCOUNTER — Telehealth: Payer: Self-pay | Admitting: Neurology

## 2017-07-01 NOTE — Telephone Encounter (Signed)
Patients daughter made aware

## 2017-07-01 NOTE — Telephone Encounter (Signed)
I do think that she can actually turn him into the [facility].  That will generate paperwork to me about driving.  She can turn him in anonymously.  As for walker, I have had EXTENSIVE discussions with him and last visit last week, he said that he was actually going to start using it.  You can tell her about the DMV as you are not giving her info about him but rather just general information that would apply to any patient

## 2017-07-01 NOTE — Telephone Encounter (Signed)
Pt's daughter [name] left a voicemail message saying she wanted to share some concerns she had about her father and in particular his driving CB# [phone]

## 2017-07-01 NOTE — Telephone Encounter (Signed)
Patient returning your call.  Thanks.

## 2017-07-01 NOTE — Telephone Encounter (Signed)
Daughter called back. She is not on the DPR. She wanted to let us know her worries about her dad. She doesn't want him to know she called, she just wanted to pass along what she has noticed. Aware I can give her no information.   She is worried about patient driving. She states she rode with him this weekend and doesn't feel he should still be driving. I did let her know if she doesn't want her dad to know we called we could not address this with him. I did let her know that I could give her the number to the driving evaluator and they could schedule a driving evaluation independently. She declined.   She is also worried that he is getting worse overall. He will complain that it feels like his body is "constricting" and is having falls and not using a walker.   Aware we can not do anything with this information if she doesn't want the patient to know she called. She wanted Dr. Carles Collet to just be aware of her concerns.

## 2017-07-01 NOTE — Telephone Encounter (Signed)
Left message on machine for patient's daughter to call back.   

## 2017-07-21 ENCOUNTER — Other Ambulatory Visit: Payer: Self-pay | Admitting: Neurology

## 2017-07-27 ENCOUNTER — Telehealth: Payer: Self-pay | Admitting: Neurology

## 2017-07-27 NOTE — Telephone Encounter (Signed)
Received note from [place] that patient's flexeril is not covered by insurance. They are asking for an alternative.   After reviewing notes - phone note from 08/07/16 states the same thing happened. I spoke with the pharmacy at that time and they told me patient always pays cash price because this medication has never been covered by insurance. At that time the cash price was $8.   Will continue to let patient pay cash price until further notice.

## 2017-07-27 NOTE — Telephone Encounter (Signed)
Patient wants to talk to someone about some clinical trails please call

## 2017-07-28 NOTE — Telephone Encounter (Signed)
Left message on machine for patient to call back.

## 2017-07-29 ENCOUNTER — Telehealth: Payer: Self-pay | Admitting: Neurology

## 2017-07-29 NOTE — Telephone Encounter (Signed)
Pt left a voicemail message saying he saw some clinical trials on TV and wanted to talk to Dr Tat regarding that

## 2017-07-29 NOTE — Telephone Encounter (Signed)
Documented in another open encounter 

## 2017-07-29 NOTE — Telephone Encounter (Signed)
I presume he was trying to say nilotinib.  It has been in trials.  I'm not sure if there is a trial area around here.  He has asked me about that drug many times in the past and we have talked about its early results.  Will you look up on clinical trials.gov and see if any local trials  Re: freezing.  There is really nothing more to do as he and I have discussed before, unfortunately.

## 2017-07-29 NOTE — Telephone Encounter (Signed)
Patient having issues with freezing and a stretching feeling in his legs. He states this has been worse the past couples weeks. Freezing better with walker, but legs still giving out due to this painful stretching feeling.  He states he did stop Mirapex, but has been off it since before last visit (even though it does not state that in last note). He is currently taking medication as follows:   Carbidopa Levodopa 25/100 IR - 1 at 7:30 am, 1 at 10:30 am, 1 at 1:30 pm, 1 at 6 pm. Amantadine - 1 tablet at 7:30 am, 1 tablet at 1:30 pm, 1 tablet at 6 pm Comtan- 1 tablet at 7:30 am, 1 tablet at 1:30 pm.   He only taking the Carbidopa Levodopa 50/200 occasionally if he has to go out. He states it does help the above symptoms when he does take it. He is nervous about increasing Levodopa because he states he does have issues with dyskinesia as well.    He is also wondering if you would recommend him to be part of a study they are doing in [place] right now. He tried to spell the drug multiple times- but spelled it different each time. "Nitinnib" was what he kept coming back to. I did some research and found a study going on at [facility] which sounded similar to what he was trying to tell me. Here is the description. "[employer], the study sponsor, is developing the study drug called [id number], a fast-acting thin film formulation of apomorphine that is placed under the tongue and is intended to be an alternative to the injectable form of apomorphine. This study is designed to determine the effectiveness, safety, and tolerability of multiple treatments of [id number] in patients with Parkinson's disease who experience OFF episodes. In this study all subjects will receive [id number]. ELIGIBILITY CRITERIA To participate you must be: -Diagnosed with idiopathic Parkinson's Disease -Experience "OFF" episodes -Be taking sinemet (carbidopa/levidopa) at least 4 times daily -Medications must be stable for at least 4  weeks Also to participate you must NOT have underwent any surgery for Parkinson's Disease"   Please advise.

## 2017-07-30 NOTE — Telephone Encounter (Signed)
Patient Casey Tucker needing to speak with you regarding his medication and if there need to be any changes made. Thanks

## 2017-07-30 NOTE — Telephone Encounter (Signed)
Patient made aware.   Looked up clinical trials and all trials for nilotinib are closed for accepting new patients.

## 2017-07-30 NOTE — Telephone Encounter (Signed)
Tried to call patient back with no answer and no voicemail.

## 2017-08-04 ENCOUNTER — Telehealth: Payer: Self-pay | Admitting: Neurology

## 2017-08-04 NOTE — Telephone Encounter (Signed)
Left message on machine for patient to call back.

## 2017-08-04 NOTE — Telephone Encounter (Signed)
Patient called and would like you to call him back regarding changing his medication to help with Dry mouth and less Dyskinesia. Please Call. Thanks

## 2017-08-16 ENCOUNTER — Telehealth: Payer: Self-pay | Admitting: Neurology

## 2017-08-16 NOTE — Telephone Encounter (Signed)
Patient called and needs you to please call him regarding regulating his medications. He wants to make sure he is taking them right. Thanks

## 2017-08-16 NOTE — Telephone Encounter (Signed)
Left message on machine for patient to call back.

## 2017-08-23 ENCOUNTER — Telehealth: Payer: Self-pay | Admitting: Neurology

## 2017-08-23 NOTE — Telephone Encounter (Signed)
Pt called and wanted a call back regarding [facility]

## 2017-08-25 ENCOUNTER — Telehealth: Payer: Self-pay | Admitting: Neurology

## 2017-08-25 ENCOUNTER — Other Ambulatory Visit: Payer: Self-pay | Admitting: Neurology

## 2017-08-25 MED ORDER — CARBIDOPA-LEVODOPA ER 50-200 MG PO TBCR: 1.0000 | EXTENDED_RELEASE_TABLET | Freq: Every day | ORAL | 1 refills | Status: DC | PRN

## 2017-08-25 MED ORDER — ENTACAPONE 200 MG PO TABS: 200.0000 mg | ORAL_TABLET | Freq: Two times a day (BID) | ORAL | 1 refills | Status: DC

## 2017-08-25 NOTE — Telephone Encounter (Signed)
Spoke with patient.  He wanted to talk through how he is taking medications.  7:30 am - 1 Levodopa, Comtan, and Amantadine 11 am - 1 Levodopa 2:30 pm - 1 Levodopa, Comtan, and Amantadine 5:30 pm - 1 Levodopa and Amantadine  He is having issues with dyskinesia. He will move his 5:30 pm Amantadine to 11 am and see if this helps since he isn't up moving as much at night.

## 2017-08-25 NOTE — Telephone Encounter (Signed)
Called patient and he was not available. He will call back.

## 2017-08-25 NOTE — Telephone Encounter (Signed)
Patient Lmom to have you please call him regarding his medication and a better way to take his pills? Please Call. Thanks

## 2017-08-26 NOTE — Telephone Encounter (Signed)
I have spoken to patient extensively about his medication and timing lately.  Dr. Carles Collet - any advise on medication management for patient? Did you want [name] to reach out to the patient? It doesn't look like he has seen Dr. Si Raider for memory.

## 2017-08-26 NOTE — Telephone Encounter (Signed)
[name], could you bring in patient and wife and see if we can find a better way to manage meds?

## 2017-08-26 NOTE — Telephone Encounter (Signed)
Patient called needing to know which medication was he to take at 10:30 AM. Please Call. Thanks

## 2017-08-27 NOTE — Telephone Encounter (Signed)
Telephone call to patient and patient's wife.  Left a message to see if they wanted to make an appointment with me to schedule a time that we can talk about finding a way to help to make managing medications a little bit easier.  I shared that I have some strategies and resources that may be helpful and I look forward to hearing back from them.

## 2017-08-30 ENCOUNTER — Other Ambulatory Visit: Payer: Self-pay | Admitting: Internal Medicine

## 2017-08-30 MED ORDER — CLONAZEPAM 0.5 MG PO TABS: ORAL_TABLET | ORAL | 0 refills | Status: DC

## 2017-08-30 NOTE — Telephone Encounter (Signed)
Klonopin refill request   Controlled substance Last OV 05/11/17 Pharmacy:  [facility] [address] in [place], [place]

## 2017-08-30 NOTE — Telephone Encounter (Signed)
Copied from [place]. Topic: Quick Communication - Rx Refill/Question >> Aug 30, 2017  2:34 PM Carolyn Stare wrote: Medication  clonazePAM (KLONOPIN) 0.5 MG tablet   Has the patient contacted their pharmacy     no ,  patient  as a new pharmacy      Preferred [facility]    Agent: Please be advised that RX refills may take up to 3 business days. We ask that you follow-up with your pharmacy.

## 2017-08-30 NOTE — Telephone Encounter (Signed)
Requesting [facility]ep[facility] to [facility]; last refilled #60 on 06/14/17. Last annual 05/11/17.Please advise.

## 2017-08-31 ENCOUNTER — Telehealth: Payer: Self-pay | Admitting: Neurology

## 2017-08-31 ENCOUNTER — Telehealth: Payer: Self-pay | Admitting: Psychology

## 2017-08-31 NOTE — Telephone Encounter (Signed)
It won't help aching legs.  You can reply via phone or my chart, which he generally answers

## 2017-08-31 NOTE — Telephone Encounter (Signed)
Patient called regarding his legs aching again. He wanted to know should DBS be adjusted again? Please Call. Thanks

## 2017-08-31 NOTE — Telephone Encounter (Signed)
Telephone call with patient to set up an appointment to meet with him and his wife to help problem solve/talk about ways to help him to develop strategies for compliance and reducing confusion about when he is taking his medications.  Patient and his wife plan to come in on 22 February at 11:00 in the morning.

## 2017-08-31 NOTE — Telephone Encounter (Signed)
Looks like Casey Tucker already spoke with the patient and set up a time to meet with him on 09/10/17. Please advise.

## 2017-09-01 NOTE — Telephone Encounter (Signed)
Tried to call patient with no answer  

## 2017-09-02 ENCOUNTER — Telehealth: Payer: Self-pay | Admitting: Neurology

## 2017-09-02 NOTE — Telephone Encounter (Signed)
Patient lmom needing to ask you a question. Please Call. Thanks

## 2017-09-03 NOTE — Telephone Encounter (Signed)
Spoke with patient. He again asked about Inbrija. He had forgotten he called about his DBS and seems to be doing fine.

## 2017-09-09 ENCOUNTER — Telehealth: Payer: Self-pay | Admitting: Neurology

## 2017-09-09 NOTE — Telephone Encounter (Signed)
He is coming in to talk to Farley about dosage times and compliance. We have spoken several times over the last two months about this specifically.

## 2017-09-09 NOTE — Telephone Encounter (Signed)
Patient states that he is having a lot of trouble with his dosage. He states that his legs are hurting and timing out at about 2 hours. He has appt with [name] tomorrow 09-10-17 and wants to talk to you then. I offered to take his number for you to call him back. He told me again that he would jsut talk to you tomorrow

## 2017-09-10 ENCOUNTER — Ambulatory Visit (INDEPENDENT_AMBULATORY_CARE_PROVIDER_SITE_OTHER): Payer: Medicare Other | Admitting: Psychology

## 2017-09-10 DIAGNOSIS — G20 Parkinson's disease: Secondary | ICD-10-CM | POA: Diagnosis not present

## 2017-09-10 NOTE — Progress Notes (Addendum)
I met with the patient and his wife today in the clinic.  The patient was using his walker today.  The purpose of today's visit was to help work with the patient and his wife with confusion about when and how he is supposed to be taking his medications and possible memory concerns about medications.  Patient came in with a drawing/graph to illustrate  how he feels good and then how he feels bad throughout the day.  We talked a little bit about progressive disease and that sometimes there are not solutions for some of his symptoms (freezing) and this can be a especially frustrating.  We talked about positive things that he is doing such as rock steady boxing and staying active and interested in things.  We talked a little bit about listening to your body and knowing when you are doing too much and when you need to rest a little.  The majority of this time was spent on finding a way to help him have compliance with his medications.  We talked about different strategies and decided that the reminder pillbox would be the best solution for him.  We talked about complying exactly how Dr. Carles Collet has identified out his medications to be taken and how the continuity of the pillbox may help with this process.  The patient is open to taking the medications as prescribed, using the pillbox and his walker that has been recommended.  His next appointment with Dr. Carles Collet is in April and so the plan is for him to do these things and report back at his next appointment how they are working out.  The plan is for them to contact me with any questions that they have related to the pillbox and/or anything that we discussed today.  More than 80 minutes was spent with the patient with over an hour of meeting with the patient and the remainder setting up the alarm on their reminder medication box.

## 2017-09-14 ENCOUNTER — Telehealth: Payer: Self-pay | Admitting: Neurology

## 2017-09-14 NOTE — Telephone Encounter (Signed)
Spoke with patient. He said he was taking too much medication prior to meeting with [name]. He admitted that every time he felt "achy" he would take a Levodopa and was taking way too much. He wanted to see how long it would take for medication to get out of his system. I let him know medication only lasts about 5 hours at most, so it would be out of his system by now. He is abiding by his medication list now and doing better. He again asked about new medications coming out and I let him know that when anything was available we would let him know.   Casey Tucker.

## 2017-09-14 NOTE — Telephone Encounter (Signed)
Patient called and would like you to please call him regarding his overdose on his medications and some other questions. Please Call. Thanks

## 2017-09-14 NOTE — Telephone Encounter (Signed)
Left message on machine for patient to call back.

## 2017-09-16 ENCOUNTER — Telehealth: Payer: Self-pay | Admitting: Neurology

## 2017-09-16 NOTE — Telephone Encounter (Signed)
Mr Norden called needing to let Dr. Carles Collet know that Casey Tucker is needing his records faxed over and he said he is not leaving her but going to see them in May. Please Call. Thanks

## 2017-09-16 NOTE — Telephone Encounter (Signed)
Referral/notes faxed to interdisciplinary clinic at [phone].

## 2017-09-17 ENCOUNTER — Telehealth: Payer: Self-pay | Admitting: Neurology

## 2017-09-17 NOTE — Telephone Encounter (Signed)
I returned a telephone call to the patient today.  His question was about medications, but since I just met with him and his wife a week prior I decided to call to assess his question and need first before transferring to another  medical team member.   Medication box - I asked him about the medication box and helps working out.  He reports that it is going fair and he is getting used to it.  He reports that he is taking his medications as prescribed.  Interdisciplinary clinic referral - in addition, I told patient that we have sent over the referral for him to go to the interdisciplinary Parkinson's program at [facility].  We sent this referral over yesterday afternoon.  Medication question- the patient reported that he had a question about his medication.  When I asked him about this question he stated that earlier today he had a hard time getting up and moving and attributed it to the rainy weather.  We talked a little bit about good days and bad days and how to work through the emotions and challenges of having a bad day by identifying strategies that help you get through them.   He talked about a new medication that should be coming out in the next couple months.  There is nothing that can be done to expedite this for him as an option at this time....  He wanted to know if there is anything that he could be doing differently with his medications to feel better.  I told him that Dr. Carles Collet is very involved in his care and is aware of the telephone conversations and questions that he has had with the other team members.  If she had any met recommendations beyond what he is doing she would have made them.   It is evident that the patient is struggling emotionally with the balance of having his chronic illness coping with the differences between good days and bad days in good hours and bad hours of the day and the struggle of wanting to remedy and fix when he is not feeling well.  The patient may be a  good candidate for cognitive reframing and cognitive behavioral therapy to help him when he is feeling anxious about his condition.  The patient was feeling better so he was planning to go to rock steady boxing today.  I told him that I would share this information with Dr. Carles Collet and if she had any other insight beyond my input that I would call him back.

## 2017-09-17 NOTE — Telephone Encounter (Signed)
Thank you for the update.  If he is agreeable, CBT may be very beneficial and is a great idea.

## 2017-09-17 NOTE — Telephone Encounter (Signed)
Patient called to speak with you about his medication. Thanks

## 2017-09-29 NOTE — Telephone Encounter (Signed)
Do you want patient to be on Inbrija?

## 2017-09-29 NOTE — Telephone Encounter (Signed)
LMOM making patient aware Dr. Carles Collet does not want to add new medication at this time.

## 2017-09-29 NOTE — Telephone Encounter (Signed)
Patient called and wanted to know if the new medication has came out. He said to leave a message if he does not answer.

## 2017-09-29 NOTE — Telephone Encounter (Signed)
No.  Not right now.

## 2017-09-30 ENCOUNTER — Other Ambulatory Visit: Payer: Self-pay | Admitting: Internal Medicine

## 2017-09-30 NOTE — Telephone Encounter (Signed)
Last filled 08-30-17 #60 Last OV 05-11-17 No Future OV

## 2017-10-04 ENCOUNTER — Telehealth: Payer: Self-pay | Admitting: Neurology

## 2017-10-04 NOTE — Telephone Encounter (Signed)
Received note from [facility] that they made several attempts to contact patient to schedule with the multidisciplinary clinic and they have not heard back.   I called patient's daughter [phone]) as she had asked for referral, as did patient. She was given the number to [facility] [phone] for them to contact them directly.

## 2017-10-05 ENCOUNTER — Telehealth: Payer: Self-pay | Admitting: Internal Medicine

## 2017-10-05 ENCOUNTER — Telehealth: Payer: Self-pay | Admitting: Neurology

## 2017-10-05 NOTE — Telephone Encounter (Signed)
You can bring him in 10/20/16, 8:15, 60 min.  In the meantime, Hoyle Sauer, could you help address with patient the number of phone calls.  See me for details

## 2017-10-05 NOTE — Telephone Encounter (Signed)
See other phone note

## 2017-10-05 NOTE — Telephone Encounter (Signed)
I spoke with pt and pt wants appt today with Dr Silvio Pate to start on a new med for parkinson's; Dr Silvio Pate said pt would need to see neurologist; pt said he cannot see Dr Tat until 11/16/17 and pt wants to get new referral to Dr Kristine Linea at [facility] in [place] due to convenience of office location. Pt request to see Dr Kristine Linea today. Advised I did not know if that was possible but would send request. Pt will wait for cb.

## 2017-10-05 NOTE — Telephone Encounter (Signed)
Copied from [place]. Topic: Appointment Scheduling - Scheduling Inquiry for Clinic >> Oct 05, 2017 11:28 AM Casey Tucker wrote: Reason for CRM: pt called and states that he is having difficulty w/ his medications w/ parkinson's, pt wanted to be seen by Casey Tucker today if possible, it was hard to understand pt while speaking pt's speech a little muffled, contact pt to advise

## 2017-10-05 NOTE — Telephone Encounter (Signed)
Patient has called numerus times with medication complaints and questions. Do you have a time you can see him?

## 2017-10-05 NOTE — Telephone Encounter (Signed)
Copied from [place]. Topic: General - Other >> Oct 05, 2017 12:16 PM Darl Householder, RMA wrote: Reason for CRM: Patient is requesting a call back from Dr. Silvio Pate or [place] concerning referral for neurosurgery

## 2017-10-05 NOTE — Telephone Encounter (Signed)
Pt is asking to be referred to Dr Fuller Plan in [facility] for Neurology. He is currently seeing Dr Alda Lea in [place].

## 2017-10-05 NOTE — Telephone Encounter (Signed)
Spoke to him As always, he is impatient and somewhat impulsive about making treatment changes Has been freezing more Interested in 2 new medications that have recently been released  Trouble driving now to [place]---and wonders about seeing new neurologist at [facility] (I recommended not if they are not movement disorder specialist)  He is most upset that he doesn't have appt with Dr Tat for over a month. Discussed that I thought it best for now if he stuck with Dr Tat (have wife drive?)  Wells Guiles, Can you push up his appt sooner? Even if he is not a candidate for new medications, he needs to hear it from you.

## 2017-10-05 NOTE — Telephone Encounter (Signed)
Hey, Casey Tucker.  His appt is 10/20/17 and not 4/30.  He was told already by [place] that he is not a candidate for inbrija.  He asked 3 weeks ago or so for a referral to [facility] movement clinic and they tried x 3 to call him and he didn't return the phone call.  He has called here many times, often with the same questions from day to day.  I believe that there may be a cognitive issue here.  As for the [place] clinic, Ms. Kristine Linea is a Designer, jewellery I believe and not a movement physician.

## 2017-10-05 NOTE — Telephone Encounter (Signed)
Pt called and said he is going on vacation with his daughter and needs to get his medication straightened out, did not know the name of the medication but said it is for parkinson's and he wants to see Dr Tat because the medication is not working, please call

## 2017-10-05 NOTE — Telephone Encounter (Signed)
Patient made aware of appt.

## 2017-10-06 NOTE — Telephone Encounter (Signed)
Thanks for the clarification. He gets quite worked up about this and I want to avoid unnecessary interventions

## 2017-10-06 NOTE — Telephone Encounter (Signed)
Spoke to pt. He is aware his appt is 10-20-17. He has since reached out to [facility].

## 2017-10-06 NOTE — Telephone Encounter (Signed)
Please call him and confirm he knows the appt with Dr Tat is 4/3---not 4/30.  She has also already informed him that he is not a candidate for the new medication he is interested in

## 2017-10-18 NOTE — Progress Notes (Signed)
`   Casey Tucker was seen today in the movement disorders clinic for neurologic consultation at the request of [name], MD.  The consultation is for the evaluation of PD.  The records that were made available to me were reviewed.  Pt is accompanied by his wife who supplements the history.   Pt reports first sx was in 2006.  Pt is R hand dominant.  Pt states that his next door neighbor (Network engineer) noted that he wasn't swinging his arm and thought that he had a stroke. He subsequently saw [name] in about 2006 and he was dx with PD.  He was started on mirapex and azilect.  It helped the overall stiffness.  Tremor developed in the R arm around the same time as the diagnosis.  Levodopa was started in approximately 2009-2010.  He also started an exercise program and he believes that has made a huge difference.  He began to have some motor fluctuations and saw [name] and had his surgery done in 02/2013.  It was a unilateral implant, placed into the L STN.  He has noted that he is sleeping much better since surgery.  No tremor.  [name] programmed the DBS one week post op.  He f/u with the NP at [place], and he now has an A and a B setting.  He hates the A setting (doesn't walk well) and he "cranked" his own B setting up to 280 (assuming frequency) and this helped walking some but he still has problems with balance and freezing.  He is on mirapex, 1 mg tid.  He is on carbidopa/levodopa 25/100, 7:30am/11am/2pm/6pm.    11/15/13:  Pt returns today for follow up.  Overall, he has been doing well.  He is off of medication this AM for programming.  His wife reports that he has done better on his new setting.  Seems to be walking better with less dyskinesia.  Until this morning, he was still on the carbidopa/levodopa 25/100, 1 tablet 4 times per day, Azilect 1 mg daily and Mirapex 1 mg 3 times per day.  He is exercising faithfully.  He has not had any falls.  He has been shuffling a little more this  morning, since he is off of medication.  11/21/13 update:  The patient is returning for followup, earlier than expected.  The patient reports that he really had not been doing as well over the weekend.  He did not feel like he was walking as well. He felt like the setting "wore" off 30 mins before the next levodopa dosage.  His wife wonders if he wasn't doing as well because he was at the beach this past weekend and there were more stairs to negotiate.  No falls.  No hallucinations   He did decrease his carbidopa/levodopa 25/100, one tablet 3 times per day from one tablet 4 times per day.  He is still on Mirapex 1 mg 3 times per day and Azilect 1 mg daily.  12/20/13 update:  The patient returns today for followup, accompanied by his wife and daughter who supplements the history.  The patient is currently on Mirapex, 1 mg 3 times a day, Azilect 1 mg daily and carbidopa/levodopa 25/100 at 7:30 AM/11 AM/2 PM and 6:30 PM.  Overall, the patient states that he is doing much better on his current setting.  It is not seeming to "wear off."  He is having some difficulty in the middle of the night when he  gets up and sometimes has to use a cane, but otherwise is doing well.  No falls.  On one occasion, he did try to get up in the morning and go and take a walk and was able to walk a few miles without taking his medication, but then he needed it when he got home.  He has noticed some dyskinesia, but it has not been particularly bothersome.  He would like to decrease dyskinesia, if able.  03/20/14 update:  Pt is f//u today.  Overall doing well.  He tried to decrease his carbidopa/levodopa 25/100 from one tablet 4 times per day to 1/0.5/0.5/1, but ended up going back up to one tablet 4 times per day.  He finds that he is due for his second dose at 11 AM, but by 10:30 AM he is shuffling, and he thinks it is primarily the right foot.  When he goes hiking, he states that he takes an extra half tablet at 10:30 AM and as well and  often takes another half tablet in the afternoon.  He finds that protein interferes with the levodopa dosing.  He continues to take 2 Flexeril at night.  He does not know if it helps, but has not tried to stop that since the DBS surgery.  He finds that the only time he has dyskinesia is at the end of the dose.  He asks me if we can slightly increase the DBS stimulation.  He continues to exercise.  No falls.  No hallucinations.  05/18/14 update: The patient is following up today, accompanied by his wife who supplements the history.  I tried to slightly decrease the levodopa, but the patient is was not able to do that.  He remains on carbidopa/levodopa 25/100, 1 tablet 4 times per day.  He is also on Mirapex 1 mg 3 times per day along with Azilect 1 mg daily. C/o some "slight" freezing episodes and med wearing off 1/2 hr before due but at the same time having frequent dyskinesia of the R leg.  No falls.  Staying very active.  No hallucinations.  C/o cost of azilect and asks about its benefit.    08/08/14 update:  Overall, the patient is doing well.  He has not taken medication since 6:30 this morning, and wanted me to see him off of medication.  He states that he has some "spotty freezing."  He asks about potentially taking metformin to help him.  He also asks about levodopa nasal spray.  He asks about several other experimental treatments.  He has occasional dyskinesia in the right foot but it is not bothersome to him.  Overall, he feels slightly slower than he used to.  He states that he generally is supposed to take his levodopa at 11 AM but he needs it by 10:30.  If he is very active he will need to take a few extra half dosages of levodopa throughout the day.  Generally, however, he takes 4 tablets of carbidopa/levodopa 25/100 throughout the day in addition to entacapone 200 mg twice a day and Mirapex 1 mg 3 times a day.  He is off of Azilect and wants to know if he should go back on it.  He noticed no  difference when discontinuing it.  He is off of Flexeril as well as noticed no trouble with that.  10/10/14 update:  Overall, the patient is doing well.  He is on carbidopa/levodopa 25/100 4 times per day.  He takes entacapone 200 mg with  the first and third dose of the levodopa.  He really does not notice that it helps and states that he still feels "jerky" 30 minutes before his next dosage is due.  He will often just take the levodopa and then.  He is on Mirapex 1 mg 3 times a day.  I added carbidopa/levodopa 50/200 CR at night because of cramping and the patient states that it helps but he rarely takes it because he thinks the dyskinesia that worse when he did it.  He states that he uses it if he really needs to "get up and go."  He does use Flexeril at night for cramping and thinks it helped as much as the carbidopa/levodopa 50/200 CR.  He did turn down the DBS device somewhat since last visit because of dyskinesia.  He states that he takes Azilect "off and on" because he was told by a previous neurologist that it was neuroprotective.  He asks me about isradapine.  02/12/15 update:  The patient returns today for follow-up.  I have communicated with him several times since last visit.  He called with complaints of freezing or before his next dose and we ended up slightly increasing his levodopa so that he is taking 1-1/2 tablets in the morning, one tablet at his next dose, 1-1/2 tablets in the mid afternoon and then 1 tablet in the later evening.  This helped but he states that sometimes he will take 1 in the AM and the 1/2 an hour or 2 later.  He still only takes 3-4 a day.  He takes entacapone with every other dose of the levodopa.  He is on pramipexole 1 mg 3 times per day.  Dyskinesia has been problematic despite the fact he complains about freezing and we ended up adding amantadine, 100 mg 3 times a day for that. It helped that but caused constipation and dry mouth.   He had 2 falls since last visit, one  of which was at the outer edge of my office when he came to bring me something.  He quickly turned around while carrying something and fell to his knee.  He did not get hurt.   He states that if he gets in a crowded space his feet will just get tangled together.   He does know that if he gets into "shuffle mode" he needs to stop and "reset."  He states overall shuffling is much better.  He asked me about several studies since our last visit, but these are not available to him because of the fact he has already had DBS.  He asks me again about nilotinib study.  05/15/15 update:  The patient is following up today regarding his Parkinson's disease.  States that he has been "fantastic."  He did have a postoperative MRI done since our last visit.  This is first he has ever had since his surgery.  The [facility] plane is approximately at contact number 2 on the lead.  We are currently actively using contact 1.  We have used contact 2 in the past successfully.  He is currently on carbidopa/levodopa 25/100; he has gone down a bit on this to about 4 and sometimes 4.5 tablets a day.  Is on Comtan 200 mg twice a day, Mirapex 1 mg 3 times a day and amantadine 100 mg 3 times a day for dyskinesia.  If he gets nervous, his legs will "twitch."  He is exercising on the bike in addition to doing physical labor.  Dropped his own programming to 3.2 and thinks that it has helped.  Did have 1-2 falls when "I get in a hurry."  Never got hurt.  Notices more trouble when he is at end of dosage.  08/15/15 update:  The patient is following up today regarding his Parkinson's disease.  He is on carbidopa/levodopa 25/100, 4- tablets per day (and then will take 2 extra 1/2 tablets as needed) along with entacapone, 200 mg twice a day.  He also takes pramipexole, 1 mg 3 times per day and amantadine 100 mg 3 times a day for dyskinesia.   Amantadine is causing dry mouth and he is using biotene to help that.   He has called several times since last  visit asking me about medical marijuana and its place in the treatment of Parkinson's disease.  I have talked to him about the position statement by the [employer].  He has not fallen since our last visit.  He reports increased dyskinesia only when he has increased stress.  Reports that he changed his DBS setting again back to where I had it previously (3.4).  He uses 2 flexeril at night for cramping.  He brings me articles to read on natural "supplements" and also asks me to look up a center in [place] that is owned by a "chiropractic neurologist" and has week long "camps" for patient to have Parkinson's disease that are supposed to help with healing of Parkinson's disease and not paid by insurance.  12/13/15 update:  The patient is following up today regarding his Parkinson's disease.  He is on carbidopa/levodopa 25/100, 4-5 tablets per day along with entacapone, 200 mg twice a day.  He also takes pramipexole, 1 mg 3 times per day and amantadine 100 mg 3 times a day for dyskinesia.  I reviewed records since last visit.  Fell at home on concrete on 11/30/15 and injured L calf.  Saw PCP few days later. Is better now.  States that he has learned that he just has to "stop" when he has a festinating gait.   No hallucinations.  No lightheadedness.    04/28/16 update:  He is on carbidopa/levodopa 25/100, 4-5 tablets per day (usually 4) along with entacapone, 200 mg twice a day.  He also takes pramipexole, 1 mg 3 times per day and amantadine 100 mg 3 times a day for dyskinesia.  He had started taking carbidopa/levodopa 50/200, one tablet in the AM as he thinks that this "gives me a 3 hour window before it is done."  He usually doesn't take it but sometimes he does.    This is odd dosing but he has rearranged his own medication to best suite him.  He has been physical therapy since our last visit.  He does think that it was helpful.  A U Step walker was recommended.  He isn't using it while out and  asks me what he can do for falls.  He does state that "my biggest falls are at home."  He does keep the walker next to him at bedtime because he finds that this is the most troublesome time when he gets up.  He finds that the top of his body gets ahead of the legs and that is why he falls.  Noting some trouble moving in confined spaces.  Reports that "I changed the DBS to 3.2 to see if it would help" but it didn't.  He is going to trainer 4-5 days a week.  Asks  if okay to hunt quail/fish if with someone.    09/01/16 update:  The patient returns today for follow-up.  He is on carbidopa/levodopa 25/100, 4 tablets throughout the day (and then 50% of the time he will add 1/2 extra tablet bid) and entacapone, 200 mg twice per day.  He also takes carbidopa/levodopa 50/200, one tablet in the morning prn.  He remains on Mirapex, 1 mg 3 times a day and amantadine 100 mg 3 times a day, for dyskinesia.  He did call me after our last visit and stated that he was falling, but admitted that he was not using his walker.  He was encouraged to start doing so.    He states today "I am not going to use that thing except at night."  States that he reduced his brain stimulator and "that didn't work so I went back up."  He has been going to rock steady boxing and thought that had helped.  Takes klonopin 0.5 mg for RBD and that is working well.  Needs a refill  12/28/16 update:  The patient returns today for follow-up, accompanied by his wife and daughter who supplement the history.  He is on carbidopa/levodopa 25/100, 4 tablets throughout the day (and then 50% of the time he will add 1/2 extra tablet bid) and entacapone, 200 mg twice per day.  He also takes carbidopa/levodopa 50/200, one tablet at night.  He remains on Mirapex, 1 mg 3 times a day (was going to try and drop that but had trouble cutting that one) and amantadine 100 mg 3 times a day, for dyskinesia.  He is doing boxing 3 days a week.  He asks me again about the [employer]  device.  States that he increased the DBS and when asked why he stated that he was hoping it would help his freezing/walking/balance.  02/24/17 update:  Patient seen today in follow-up, accompanied by his wife who supplements the history.  Patient is on carbidopa/levodopa, 25/100, 4 tablets during the day and entacapone 200 mg twice per day.  He takes 2 extra 1/2 tablets of carbidopa/levodopa 25/100 during the day as needed.   He takes carbidopa/levodopa 50/200 at bedtime.  Sometimes he takes 1/2 of a time released tablet.   Last visit, I told him he could try and reduce his pramipexole from 1 mg 3 times per day to 0.5 mg 3 times per day.  He states that he did that and noted no difference.  He remains on amantadine 100 mg 3 times per day.  He has had no hallucinations.  He continues to exercise with boxing and he loves it.  In regards to falls, his wife states that he is falling more.  He won't use the walker.  He is on klonopin for RBD and "I take one or two" and "it works really well."  He has noted last week his battery has reached "ERI."  06/23/17 update:  Pt seen in f/u for PD.  This patient is accompanied in the office by his spouse who supplements the history.  Patient is on carbidopa/levodopa 25/100, 4 tablets in the day.  He takes entacapone with 2 of these dosages.  He is on carbidopa/levodopa 50/200 at bedtime.  He sometimes takes this bid (one in the AM and one in PM) Last visit, I gave him a weaning schedule to get off of pramipexole.  He states that "I take a little here and there."  He is only taking 1/2 tablet prn per  patient, but then when he gives me a schedule of how he takes his medication, he includes 3 pramipexole per day.  He is still on amantadine, 100 mg 3 times per day.  He is on clonazepam 0.5 mg, 1-2 tablets at night for REM behavior disorder.  He has not had any acting out of the dreams.  He asks about freezing.  He doesn't like to use the walker because he doesn't want to be dependent  on it.  He had to give up hunting because of falls.  He is doing well with RSB.  He asks about cannabis oil.  He asks me about giving him IV thiamine.    10/20/17 update: Patient seen today in follow-up.  This patient is accompanied in the office by his spouse who supplements the history. I have received numerous phone calls from the patient and some from the family since our last visit.  His daughter called about concerns about his driving, but did not want [place] to know that she called.  I recommended that she reveal her concerns to the [facility] to generate paperwork for me, but that has not been done.  Patient has called and asked about clinical trials for nilotinib, but they were not accepting new patients and that trial has been closed for new patients.  He called me both about freezing as well as dyskinesia.  He  called me about Inbrija.  He asks about this today and discussed that this will make his dyskinesia worse.  He still wants to consider. He got stuck one time in a restaurant.   He has met with our social worker in the office since our last visit.  He called and requested a referral to the interdisciplinary clinic at [facility], but when he was contacted he refused to make an appointment.  His daughter called back and has an appt in early May.   He requested transfer to a nurse practitioner in [place], but ultimately after discussion with his primary care physician, he decided to follow-up here.  Medication wise, he remains on carbidopa/levodopa 25/100, 4 tablets throughout the day (occasionally takes an extra) and he takes entacapone with 2 of these dosages.  He is no longer on carbidopa/levodopa 50/200 at bedtime.  He is on amantadine, 100 mg, 3 times per day.  Told him again last visit to discontinue the pramipexole.  Using alarmed pill box and wife states that they are finding this beneficial.  States that biggest c/o is "timing out."  He cannot describing what timing out means but after multiple questions,  may mean freezing.  Wife thinks that since meds more regular doing better but she does not some freezing.  She notes interference with protein when eating.  Going to boxing 3 days a week.  PREVIOUS MEDICATIONS: Sinemet, Mirapex and azilect  ALLERGIES:   Allergies  Allergen Reactions  . Amoxicillin   . Celebrex [Celecoxib]   . Demerol Other (See Comments)    Hallucinations   . Penicillins     REACTION: RASH    CURRENT MEDICATIONS:  Current Outpatient Medications on File Prior to Visit  Medication Sig Dispense Refill  . amantadine (SYMMETREL) 100 MG capsule TAKE ONE CAPSULE THREE TIMES A DAY 90 capsule 5  . carbidopa-levodopa (SINEMET CR) 50-200 MG tablet Take 1 tablet by mouth daily as needed. 90 tablet 1  . carbidopa-levodopa (SINEMET IR) 25-100 MG tablet TAKE 1 TABLET BY MOUTH 4 TIMES DAILY AND1/2 TABLET 1-2 TIMES DAILY 150 tablet 5  .  clonazePAM (KLONOPIN) 0.5 MG tablet TAKE 1 TO 2 TABLETS BY MOUTH AT BEDTIME AS NEEDED 60 tablet 0  . cyclobenzaprine (FLEXERIL) 10 MG tablet TAKE 2 TABLETS BY MOUTH AT BEDTIME 60 tablet 2  . entacapone (COMTAN) 200 MG tablet Take 1 tablet (200 mg total) by mouth 2 (two) times daily. 180 tablet 1  . pramipexole (MIRAPEX) 1 MG tablet TAKE 1 TABLET BY MOUTH 3 TIMES DAILY 90 tablet 5   No current facility-administered medications on file prior to visit.     PAST MEDICAL HISTORY:   Past Medical History:  Diagnosis Date  . Arthritis    left hip   . GERD (gastroesophageal reflux disease) Pre 2002   Mild  . History of kidney stones   . 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 Parkinson disease ([place]) 2004   Slowly progressive  . Ruptured appendix teens    PAST SURGICAL HISTORY:   Past Surgical History:  Procedure Laterality Date  . APPENDECTOMY    . CATARACT EXTRACTION W/ INTRAOCULAR LENS  IMPLANT, BILATERAL  2017  . DEEP BRAIN STIMULATOR PLACEMENT  8/14   L STN  .  DOPPLER ECHOCARDIOGRAPHY  01/10/2009   LV NML Mild LVH EF 60-65% aortic sclerosis w/0 stenosis   . JOINT REPLACEMENT     left hip replacement 08/14/11/[place]   . Neuro evaluation  12/19/2004   [name], benign tremor  . SUBTHALAMIC STIMULATOR BATTERY REPLACEMENT Left 03/25/2017   Procedure: Deep Brain Stimulator battery replacement;  Surgeon: Erline Levine, MD;  Location: [place];  Service: Neurosurgery;  Laterality: Left;  left  . TONSILLECTOMY    . TOTAL HIP REVISION  08/27/2011   Procedure: TOTAL HIP REVISION;  Surgeon: Mauri Pole, MD;  Location: [facility];  Service: Orthopedics;  Laterality: Left;    SOCIAL HISTORY:   Social History   Socioeconomic History  . Marital status: Married    Spouse name: Not on file  . Number of children: 2  . Years of education: Not on file  . Highest education level: Not on file  Occupational History  . 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 Financial resource strain: Not on file  . Food insecurity:    Worry: Not on file    Inability: Not on file  . Transportation needs:    Medical: Not on file    Non-medical: Not on file  Tobacco Use  . Smoking status: Never Smoker  . Smokeless tobacco: Never Used  Substance and Sexual Activity  . Alcohol use: Yes    Alcohol/week: 3.5 oz    Types: 7 Standard drinks or equivalent per week    Comment: occassionally  . Drug use: No  . Sexual activity: Yes  Lifestyle  . Physical activity:    Days per week: Not on file    Minutes per session: Not on file  . Stress: Not on file  Relationships  . Social connections:    Talks on phone: Not on file    Gets together: Not on file    Attends religious service: Not on file    Active member of club or organization: Not on file    Attends meetings of clubs or organizations: Not on file    Relationship status: Not on file  . Intimate partner violence:    Fear of current or ex partner: Not on file    Emotionally abused:  Not on  file    Physically abused: Not on file    Forced sexual activity: Not on file  Other Topics Concern  . Not on file  Social History Narrative   Married, lives with wife   2 daughters      Has living will   Wife has health care POA---then daughters   Would still accept CPR--but no prolonged artificial means (ventilator or tube feeds)    FAMILY HISTORY:   Family Status  Relation Name Status  . Mother  Deceased at age 5       knee replacement  . Father  Deceased at age 68       tremor  . Sister  Stage manager in [place], [place]  . Annamarie Major  (Not Specified)    ROS:  A complete 10 system review of systems was obtained and was unremarkable apart from what is mentioned above.  PHYSICAL EXAMINATION:    VITALS:   Vitals:   10/20/17 0823  BP: 130/70  Pulse: 66  SpO2: 95%  Weight: 163 lb (73.9 kg)  Height: '6\' 1"'$  (1.854 m)   Wt Readings from Last 3 Encounters:  10/20/17 163 lb (73.9 kg)  06/23/17 164 lb (74.4 kg)  05/11/17 159 lb 8 oz (72.3 kg)     GEN:  The patient appears stated age and is in NAD. HEENT:  Normocephalic, atraumatic.  The mucous membranes are moist. The superficial temporal arteries are without ropiness or tenderness. CV: RRR Lungs:  CTAB Neck: no bruits   Neurological examination:  Orientation: The patient is alert and oriented x3.  Cranial nerves: There is good facial symmetry.  There is facial hypomimia.  he speech is slightly dysarthric but fluent. Soft palate rises symmetrically and there is no tongue deviation. Hearing is intact to conversational tone. Motor: Strength is 5/5 in the bilateral upper and lower extremities.   Shoulder shrug is equal and symmetric.  There is no pronator drift.  Movement examination: Tone: There is normal tone in the bilateral upper extremities.  The tone in the lower extremities is normal.  Abnormal movements: There is dyskinesia of the R leg. Coordination:  There is decreased RAM's with any form of RAMS,  including alternating supination and pronation of the forearm, hand opening and closing, finger taps, heel taps and toe taps, R more than L Gait and Station: The patient used his walker.  Still shuffles but unable to run/festinate with the walker so does better than in the past.   LABS    Chemistry      Component Value Date/Time   NA 139 03/23/2017 1442   NA 142 08/11/2016 1701   NA 139 08/15/2011 0535   K 4.2 03/23/2017 1442   K 3.8 08/15/2011 0535   CL 106 03/23/2017 1442   CL 101 08/15/2011 0535   CO2 28 03/23/2017 1442   CO2 29 08/15/2011 0535   BUN 17 03/23/2017 1442   BUN 18 08/11/2016 1701   BUN 12 08/15/2011 0535   CREATININE 0.82 03/23/2017 1442   CREATININE 0.73 08/15/2011 0535      Component Value Date/Time   CALCIUM 8.8 (L) 03/23/2017 1442   CALCIUM 7.6 (L) 08/15/2011 0535   ALKPHOS 86 08/11/2016 1701   AST 17 08/11/2016 1701   ALT 19 08/11/2016 1701   BILITOT 1.2 08/11/2016 1701     Lab Results  Component Value Date   TSH 2.82 03/16/2012    DBS programming  was performed today which is described in more detail on a separate programming procedural notes.    ASSESSMENT/PLAN:  1. Parkinson's disease.    -The patient is status post left STN DBS at [facility] on 03/14/2013.    -remain on the carbidopa/levodopa 25/100,  4-5 tablets/day and comtan 200 mg bid.    -He is now off of carbidopa/levodopa 50/200, 1 daily.    -off of Pramipexole and should remain off of it.  - for now continue Amantadine, 100 mg tid.  May contribute to memory issues and may need to wean medication in the future.   -asks me about switching to directional lead.  Have discussed this previously.  There would be no advantage in him to do this.  There would potentially be an advantage to putting in lead on the opposite side given the fact that we have to balance medication and DBS currently.  However, not sure that he would be a surgical candidate now.  -discussed inbrija in detail. he is very  interested in this but I worry about overuse in him and worry about worsening significant dyskinesia.  I told him that it is indicated for use up to 5 times per day but I don't want him using it more than 2 times per day.  Risks, benefits, side effects and alternative therapies were discussed.  The opportunity to ask questions was given and they were answered to the best of my ability.  The patient expressed understanding and willingness to follow the outlined treatment protocols.  -Reiterated the importance of walker at all times.  -doing well in rock steady boxing  -Talked about writing down his questions and we will address them at appointments, especially questions regarding clinical trials.  I obviously want him to call here if he has an urgent or emergent need, but multiple phone calls have been received since last visit. 2.  Memory change  -we will schedule neurocognitive testing.   3.  Constipation  -Was made worse by amantadine.  He is using the rancho recipe.  He is using Biotene for the dry mouth but thinks that is from the amantadine as well and it may be. 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  Follow up is anticipated in the next few months, sooner should new neurologic issues arise.  Much greater than 50% of this visit was spent in counseling and coordinating care.  Total face to face time:  45 min, not including DBS time

## 2017-10-20 ENCOUNTER — Ambulatory Visit (INDEPENDENT_AMBULATORY_CARE_PROVIDER_SITE_OTHER): Payer: Medicare Other | Admitting: Neurology

## 2017-10-20 ENCOUNTER — Telehealth: Payer: Self-pay

## 2017-10-20 ENCOUNTER — Encounter: Payer: Self-pay | Admitting: Neurology

## 2017-10-20 VITALS — BP 130/70 | HR 66 | Ht 73.0 in | Wt 163.0 lb

## 2017-10-20 DIAGNOSIS — R413 Other amnesia: Secondary | ICD-10-CM | POA: Diagnosis not present

## 2017-10-20 DIAGNOSIS — G20 Parkinson's disease: Secondary | ICD-10-CM

## 2017-10-20 NOTE — Telephone Encounter (Signed)
I called pt;pt said he had previously been prescribed an inhaler that he used bid prn; pt cannot remember name of inhaler but wanted Dr Silvio Pate to know that he already has the inhaler. Pt is scheduled for several tests at [facility] with appts with several different doctors for parkinson. Pt wants to know Dr Alla German opinion of him doing this.

## 2017-10-20 NOTE — Telephone Encounter (Signed)
Copied from [place] [id number]. Topic: General - Other >> Oct 20, 2017  1:06 PM Cecelia Byars, NT wrote: Reason for CRM: Patient said he has a question for Dr Silvio Pate  ,and would like a call back from him only he would not say what the question was in reference to , and would like for him to call him on his cell first at [phone] and then his home number is [phone], thanks

## 2017-10-20 NOTE — Telephone Encounter (Signed)
He is positive about trying the new medication. Has been referred to neuropsychiatrist --this is appropriate

## 2017-10-20 NOTE — Patient Instructions (Signed)
1. We will send Inbrija for approval. If you receive medication- take this no more than two times daily.   2. You have been referred for a neurocognitive evaluation in our office.   The evaluation takes approximately two hours. The first part of the appointment is a clinical interview with the neuropsychologist (Dr. Macarthur Critchley). Please bring someone with you to this appointment if possible, as it is helpful for Dr. Si Raider to hear from both you and another adult who knows you well. After speaking with Dr. Si Raider, you will complete testing with her technician. The testing includes a variety of tasks- mostly question-and-answer, some paper-and-pencil. There is nothing you need to do to prepare for this appointment, but having a good night's sleep prior to the testing, and bringing eyeglasses and hearing aids (if you wear them), is advised.   About a week after the evaluation, you will return to follow up with Dr. Si Raider to review the test results. This appointment is about 30 minutes. If you would like a family member to receive this information as well, please bring them to the appointment.   We have to reserve several hours of the neuropsychologist's time and the psychometrician's time for your evaluation appointment. As such, please note that there is a No-Show fee of $100. If you are unable to attend any of your appointments, please contact our office as soon as possible to reschedule.

## 2017-10-20 NOTE — Procedures (Signed)
DBS Programming was performed.    Total time spent reviewing programming was 10 minutes.  Device was confirmed to be on.  Soft start was confirmed to be on.  Impedences were checked and were within normal limits.   All impedances were well within normal limits and are recorded on a separate worksheet.  Battery was checked and was determined to be functioning normally (2.95).  Final settings were as follows:  Left brain electrode, Group A:     2-C+; Amplitude  3.5   V   ; Pulse width 60 microseconds;   Frequency   130   Hz.  Left brain electrode, group B:  1-C+, amplitude: 3.2 V, PW: 90, frequency 130  Group B was active at the time pt left the office.  Right brain electrode:     n/a

## 2017-10-26 ENCOUNTER — Telehealth: Payer: Self-pay | Admitting: Neurology

## 2017-10-26 NOTE — Telephone Encounter (Signed)
I called patient according to the staff message and he wants to talk to [facility] about why he is coming in to see Dr Si Raider  Please call

## 2017-10-26 NOTE — Telephone Encounter (Signed)
This was thoroughly explained to patient when he was in the office to see Dr. Carles Collet.   [name] can you discuss with him?

## 2017-10-28 ENCOUNTER — Other Ambulatory Visit: Payer: Self-pay | Admitting: Neurology

## 2017-10-28 MED ORDER — CYCLOBENZAPRINE HCL 10 MG PO TABS: 20.0000 mg | ORAL_TABLET | Freq: Every day | ORAL | 5 refills | Status: DC

## 2017-10-29 NOTE — Telephone Encounter (Signed)
Telephone call with  patient's wife.  Explained that patient has an appointment next with Dr. Carles Collet in August.  In addition, he has neurocognitive testing that will occur in August as well.  In the interim, I am more than happy to meet with patient to help with coping mechanisms and strategies to help when he is hyper focused and/or experiencing anxiety. I am more than happy to schedule an appointment with him as needed.

## 2017-11-03 ENCOUNTER — Telehealth: Payer: Self-pay | Admitting: Neurology

## 2017-11-03 NOTE — Telephone Encounter (Signed)
Received approval for Inbrija on 10/27/17. Sent to scan.

## 2017-11-04 ENCOUNTER — Telehealth: Payer: Self-pay | Admitting: Neurology

## 2017-11-04 NOTE — Telephone Encounter (Signed)
Left message on machine for patient to call back.

## 2017-11-04 NOTE — Telephone Encounter (Signed)
Patient called and Lmom. Could not make out what the message said? Please Call. Thanks

## 2017-11-08 ENCOUNTER — Other Ambulatory Visit: Payer: Self-pay | Admitting: Neurology

## 2017-11-08 MED ORDER — CARBIDOPA-LEVODOPA 25-100 MG PO TABS: ORAL_TABLET | ORAL | 5 refills | Status: DC

## 2017-11-10 DIAGNOSIS — Z08 Encounter for follow-up examination after completed treatment for malignant neoplasm: Secondary | ICD-10-CM | POA: Diagnosis not present

## 2017-11-10 DIAGNOSIS — D2261 Melanocytic nevi of right upper limb, including shoulder: Secondary | ICD-10-CM | POA: Diagnosis not present

## 2017-11-10 DIAGNOSIS — X32XXXA Exposure to sunlight, initial encounter: Secondary | ICD-10-CM | POA: Diagnosis not present

## 2017-11-10 DIAGNOSIS — D225 Melanocytic nevi of trunk: Secondary | ICD-10-CM | POA: Diagnosis not present

## 2017-11-10 DIAGNOSIS — D2262 Melanocytic nevi of left upper limb, including shoulder: Secondary | ICD-10-CM | POA: Diagnosis not present

## 2017-11-10 DIAGNOSIS — L570 Actinic keratosis: Secondary | ICD-10-CM | POA: Diagnosis not present

## 2017-11-10 DIAGNOSIS — D2271 Melanocytic nevi of right lower limb, including hip: Secondary | ICD-10-CM | POA: Diagnosis not present

## 2017-11-10 DIAGNOSIS — Z872 Personal history of diseases of the skin and subcutaneous tissue: Secondary | ICD-10-CM | POA: Diagnosis not present

## 2017-11-10 DIAGNOSIS — Z85828 Personal history of other malignant neoplasm of skin: Secondary | ICD-10-CM | POA: Diagnosis not present

## 2017-11-10 DIAGNOSIS — D2272 Melanocytic nevi of left lower limb, including hip: Secondary | ICD-10-CM | POA: Diagnosis not present

## 2017-11-10 DIAGNOSIS — Z09 Encounter for follow-up examination after completed treatment for conditions other than malignant neoplasm: Secondary | ICD-10-CM | POA: Diagnosis not present

## 2017-11-15 ENCOUNTER — Other Ambulatory Visit: Payer: Self-pay | Admitting: Internal Medicine

## 2017-11-15 NOTE — Telephone Encounter (Signed)
Last filled 10-01-17 #60 Last OV 05-11-17 No Future OV

## 2017-11-16 ENCOUNTER — Ambulatory Visit: Payer: Medicare Other | Admitting: Neurology

## 2017-11-17 DIAGNOSIS — Z6822 Body mass index (BMI) 22.0-22.9, adult: Secondary | ICD-10-CM | POA: Diagnosis not present

## 2017-11-17 DIAGNOSIS — G20 Parkinson's disease: Secondary | ICD-10-CM | POA: Diagnosis not present

## 2017-11-17 DIAGNOSIS — Z7409 Other reduced mobility: Secondary | ICD-10-CM | POA: Diagnosis not present

## 2017-11-25 ENCOUNTER — Telehealth: Payer: Self-pay | Admitting: Neurology

## 2017-11-25 NOTE — Telephone Encounter (Signed)
Received note from [place] that patient's specialty pharmacy is [facility]. Ph. [phone]. Fax. [phone].

## 2017-11-26 ENCOUNTER — Other Ambulatory Visit: Payer: Self-pay | Admitting: Neurology

## 2017-11-26 MED ORDER — AMANTADINE HCL 100 MG PO CAPS: ORAL_CAPSULE | ORAL | 5 refills | Status: DC

## 2017-11-29 DIAGNOSIS — Z961 Presence of intraocular lens: Secondary | ICD-10-CM | POA: Diagnosis not present

## 2017-11-29 DIAGNOSIS — H353131 Nonexudative age-related macular degeneration, bilateral, early dry stage: Secondary | ICD-10-CM | POA: Diagnosis not present

## 2017-11-29 DIAGNOSIS — H35371 Puckering of macula, right eye: Secondary | ICD-10-CM | POA: Diagnosis not present

## 2017-12-01 ENCOUNTER — Telehealth: Payer: Self-pay | Admitting: Neurology

## 2017-12-01 NOTE — Telephone Encounter (Signed)
Received vm from patient about Inbrija and getting medication. Gave him/wife number to the specialty pharmacy [facility]. Ph. [phone] and [facility] support to work out getting medication shipped.

## 2017-12-03 ENCOUNTER — Telehealth: Payer: Self-pay | Admitting: Neurology

## 2017-12-03 NOTE — Telephone Encounter (Signed)
Tried to call back with no answer  

## 2017-12-03 NOTE — Telephone Encounter (Signed)
Pt left a message asking for a call back from [facility] regarding his new prescription for Parkinson's

## 2017-12-07 ENCOUNTER — Telehealth: Payer: Self-pay | Admitting: Neurology

## 2017-12-07 MED ORDER — ENTACAPONE 200 MG PO TABS: 200.0000 mg | ORAL_TABLET | Freq: Two times a day (BID) | ORAL | 1 refills | Status: DC

## 2017-12-07 NOTE — Telephone Encounter (Signed)
I will await call back. I have spoken to him several times about Inbrija RX this past week.

## 2017-12-07 NOTE — Telephone Encounter (Signed)
RX sent

## 2017-12-07 NOTE — Telephone Encounter (Signed)
Pt left a VM message and it was really hard to understand the message but I believe I heard prescription for garbadopa, I am sorry

## 2017-12-07 NOTE — Telephone Encounter (Signed)
*  STAT* If patient is at the pharmacy, call can be transferred to refill team.  1.     Which medications need to be refilled? (please list name of each medication and dose if know) Comtan   2.     Which pharmacy/location (including street and city if local pharmacy) is medication to be sent to? * [facility]   3.     Do they need a 30 or 90 day supply?   he said that we would need to look at the record for the dosage change

## 2017-12-09 ENCOUNTER — Telehealth: Payer: Self-pay | Admitting: Neurology

## 2017-12-09 NOTE — Telephone Encounter (Signed)
Yes.  Looks like transferring care since will be with [name] for DBS

## 2017-12-09 NOTE — Telephone Encounter (Signed)
Left message on machine for patient to call back.  To let him know he needs to contact [name] for medication since she changed it.

## 2017-12-09 NOTE — Telephone Encounter (Signed)
Received note from [place] that patient was told to increase Comtan to five times daily.   Also received note from Lezlie Octave, MD. It looks like patient was seen and told to increase Comtan. Note states:   "Please consider taking your carbidopa levodopa differently.  7:30 am - 2 tabs of Carbidopa Levodopa 25/100  1 tab Comtan 1 tab Amantadine  10 am - 1 tab Carbidopa levodopa 1 tab Comtan  1 pm - 1 tab carbidopa levodopa 1 tab comtan 1 tab amantadine  4 pm - 1 tab carbidopa levodopa 1 tab comtan 1 tab amantadine  7 pm - 1 tab carbidopa levodopa 1 tab comtan  We will schedule an appt with [name] to talk more about DBS and future treatment plans"   Dr. Carles Collet - should patient contact them re: Comtan RX?

## 2017-12-10 NOTE — Telephone Encounter (Signed)
Ok.  He did just get another opinion at [facility].

## 2017-12-10 NOTE — Telephone Encounter (Signed)
Casey Tucker - pt in on wed now for work in for pain.  Don't know where his pain is but I don't treat pain issues.  Needs to go to PCP.  Records reviewed.  He has a new patient appt with [name] on 6/13 and just saw [name] 5/1.  He can f/u with [name] regarding movement as he doesn't need 2 different movement specialists.

## 2017-12-10 NOTE — Telephone Encounter (Signed)
Patient states he is just getting a second opinion and does not want to transfer care. I did let him know he could not have two movement disorder doctors trying to manage the same condition. He is very adamant about keeping appt on Wednesday to discuss medication/symptoms. He states not having pain.  Dr. Carles Collet - fyi.

## 2017-12-14 ENCOUNTER — Telehealth: Payer: Self-pay | Admitting: Neurology

## 2017-12-14 NOTE — Telephone Encounter (Signed)
Inbrija start form with HIPPA release faxed to number provided. If he needs something different we can discuss tomorrow when he is in the office.

## 2017-12-14 NOTE — Progress Notes (Signed)
`   Casey Tucker was seen today in the movement disorders clinic for neurologic consultation at the request of [name], MD.  The consultation is for the evaluation of PD.  The records that were made available to me were reviewed.  Pt is accompanied by his wife who supplements the history.   Pt reports first sx was in 2006.  Pt is R hand dominant.  Pt states that his next door neighbor (Network engineer) noted that he wasn't swinging his arm and thought that he had a stroke. He subsequently saw [name] in about 2006 and he was dx with PD.  He was started on mirapex and azilect.  It helped the overall stiffness.  Tremor developed in the R arm around the same time as the diagnosis.  Levodopa was started in approximately 2009-2010.  He also started an exercise program and he believes that has made a huge difference.  He began to have some motor fluctuations and saw [name] and had his surgery done in 02/2013.  It was a unilateral implant, placed into the L STN.  He has noted that he is sleeping much better since surgery.  No tremor.  [name] programmed the DBS one week post op.  He f/u with the NP at [place], and he now has an A and a B setting.  He hates the A setting (doesn't walk well) and he "cranked" his own B setting up to 280 (assuming frequency) and this helped walking some but he still has problems with balance and freezing.  He is on mirapex, 1 mg tid.  He is on carbidopa/levodopa 25/100, 7:30am/11am/2pm/6pm.    11/15/13:  Pt returns today for follow up.  Overall, he has been doing well.  He is off of medication this AM for programming.  His wife reports that he has done better on his new setting.  Seems to be walking better with less dyskinesia.  Until this morning, he was still on the carbidopa/levodopa 25/100, 1 tablet 4 times per day, Azilect 1 mg daily and Mirapex 1 mg 3 times per day.  He is exercising faithfully.  He has not had any falls.  He has been shuffling a little more this  morning, since he is off of medication.  11/21/13 update:  The patient is returning for followup, earlier than expected.  The patient reports that he really had not been doing as well over the weekend.  He did not feel like he was walking as well. He felt like the setting "wore" off 30 mins before the next levodopa dosage.  His wife wonders if he wasn't doing as well because he was at the beach this past weekend and there were more stairs to negotiate.  No falls.  No hallucinations   He did decrease his carbidopa/levodopa 25/100, one tablet 3 times per day from one tablet 4 times per day.  He is still on Mirapex 1 mg 3 times per day and Azilect 1 mg daily.  12/20/13 update:  The patient returns today for followup, accompanied by his wife and daughter who supplements the history.  The patient is currently on Mirapex, 1 mg 3 times a day, Azilect 1 mg daily and carbidopa/levodopa 25/100 at 7:30 AM/11 AM/2 PM and 6:30 PM.  Overall, the patient states that he is doing much better on his current setting.  It is not seeming to "wear off."  He is having some difficulty in the middle of the night when he  gets up and sometimes has to use a cane, but otherwise is doing well.  No falls.  On one occasion, he did try to get up in the morning and go and take a walk and was able to walk a few miles without taking his medication, but then he needed it when he got home.  He has noticed some dyskinesia, but it has not been particularly bothersome.  He would like to decrease dyskinesia, if able.  03/20/14 update:  Pt is f//u today.  Overall doing well.  He tried to decrease his carbidopa/levodopa 25/100 from one tablet 4 times per day to 1/0.5/0.5/1, but ended up going back up to one tablet 4 times per day.  He finds that he is due for his second dose at 11 AM, but by 10:30 AM he is shuffling, and he thinks it is primarily the right foot.  When he goes hiking, he states that he takes an extra half tablet at 10:30 AM and as well and  often takes another half tablet in the afternoon.  He finds that protein interferes with the levodopa dosing.  He continues to take 2 Flexeril at night.  He does not know if it helps, but has not tried to stop that since the DBS surgery.  He finds that the only time he has dyskinesia is at the end of the dose.  He asks me if we can slightly increase the DBS stimulation.  He continues to exercise.  No falls.  No hallucinations.  05/18/14 update: The patient is following up today, accompanied by his wife who supplements the history.  I tried to slightly decrease the levodopa, but the patient is was not able to do that.  He remains on carbidopa/levodopa 25/100, 1 tablet 4 times per day.  He is also on Mirapex 1 mg 3 times per day along with Azilect 1 mg daily. C/o some "slight" freezing episodes and med wearing off 1/2 hr before due but at the same time having frequent dyskinesia of the R leg.  No falls.  Staying very active.  No hallucinations.  C/o cost of azilect and asks about its benefit.    08/08/14 update:  Overall, the patient is doing well.  He has not taken medication since 6:30 this morning, and wanted me to see him off of medication.  He states that he has some "spotty freezing."  He asks about potentially taking metformin to help him.  He also asks about levodopa nasal spray.  He asks about several other experimental treatments.  He has occasional dyskinesia in the right foot but it is not bothersome to him.  Overall, he feels slightly slower than he used to.  He states that he generally is supposed to take his levodopa at 11 AM but he needs it by 10:30.  If he is very active he will need to take a few extra half dosages of levodopa throughout the day.  Generally, however, he takes 4 tablets of carbidopa/levodopa 25/100 throughout the day in addition to entacapone 200 mg twice a day and Mirapex 1 mg 3 times a day.  He is off of Azilect and wants to know if he should go back on it.  He noticed no  difference when discontinuing it.  He is off of Flexeril as well as noticed no trouble with that.  10/10/14 update:  Overall, the patient is doing well.  He is on carbidopa/levodopa 25/100 4 times per day.  He takes entacapone 200 mg with  the first and third dose of the levodopa.  He really does not notice that it helps and states that he still feels "jerky" 30 minutes before his next dosage is due.  He will often just take the levodopa and then.  He is on Mirapex 1 mg 3 times a day.  I added carbidopa/levodopa 50/200 CR at night because of cramping and the patient states that it helps but he rarely takes it because he thinks the dyskinesia that worse when he did it.  He states that he uses it if he really needs to "get up and go."  He does use Flexeril at night for cramping and thinks it helped as much as the carbidopa/levodopa 50/200 CR.  He did turn down the DBS device somewhat since last visit because of dyskinesia.  He states that he takes Azilect "off and on" because he was told by a previous neurologist that it was neuroprotective.  He asks me about isradapine.  02/12/15 update:  The patient returns today for follow-up.  I have communicated with him several times since last visit.  He called with complaints of freezing or before his next dose and we ended up slightly increasing his levodopa so that he is taking 1-1/2 tablets in the morning, one tablet at his next dose, 1-1/2 tablets in the mid afternoon and then 1 tablet in the later evening.  This helped but he states that sometimes he will take 1 in the AM and the 1/2 an hour or 2 later.  He still only takes 3-4 a day.  He takes entacapone with every other dose of the levodopa.  He is on pramipexole 1 mg 3 times per day.  Dyskinesia has been problematic despite the fact he complains about freezing and we ended up adding amantadine, 100 mg 3 times a day for that. It helped that but caused constipation and dry mouth.   He had 2 falls since last visit, one  of which was at the outer edge of my office when he came to bring me something.  He quickly turned around while carrying something and fell to his knee.  He did not get hurt.   He states that if he gets in a crowded space his feet will just get tangled together.   He does know that if he gets into "shuffle mode" he needs to stop and "reset."  He states overall shuffling is much better.  He asked me about several studies since our last visit, but these are not available to him because of the fact he has already had DBS.  He asks me again about nilotinib study.  05/15/15 update:  The patient is following up today regarding his Parkinson's disease.  States that he has been "fantastic."  He did have a postoperative MRI done since our last visit.  This is first he has ever had since his surgery.  The [facility] plane is approximately at contact number 2 on the lead.  We are currently actively using contact 1.  We have used contact 2 in the past successfully.  He is currently on carbidopa/levodopa 25/100; he has gone down a bit on this to about 4 and sometimes 4.5 tablets a day.  Is on Comtan 200 mg twice a day, Mirapex 1 mg 3 times a day and amantadine 100 mg 3 times a day for dyskinesia.  If he gets nervous, his legs will "twitch."  He is exercising on the bike in addition to doing physical labor.  Dropped his own programming to 3.2 and thinks that it has helped.  Did have 1-2 falls when "I get in a hurry."  Never got hurt.  Notices more trouble when he is at end of dosage.  08/15/15 update:  The patient is following up today regarding his Parkinson's disease.  He is on carbidopa/levodopa 25/100, 4- tablets per day (and then will take 2 extra 1/2 tablets as needed) along with entacapone, 200 mg twice a day.  He also takes pramipexole, 1 mg 3 times per day and amantadine 100 mg 3 times a day for dyskinesia.   Amantadine is causing dry mouth and he is using biotene to help that.   He has called several times since last  visit asking me about medical marijuana and its place in the treatment of Parkinson's disease.  I have talked to him about the position statement by the [employer].  He has not fallen since our last visit.  He reports increased dyskinesia only when he has increased stress.  Reports that he changed his DBS setting again back to where I had it previously (3.4).  He uses 2 flexeril at night for cramping.  He brings me articles to read on natural "supplements" and also asks me to look up a center in [place] that is owned by a "chiropractic neurologist" and has week long "camps" for patient to have Parkinson's disease that are supposed to help with healing of Parkinson's disease and not paid by insurance.  12/13/15 update:  The patient is following up today regarding his Parkinson's disease.  He is on carbidopa/levodopa 25/100, 4-5 tablets per day along with entacapone, 200 mg twice a day.  He also takes pramipexole, 1 mg 3 times per day and amantadine 100 mg 3 times a day for dyskinesia.  I reviewed records since last visit.  Fell at home on concrete on 11/30/15 and injured L calf.  Saw PCP few days later. Is better now.  States that he has learned that he just has to "stop" when he has a festinating gait.   No hallucinations.  No lightheadedness.    04/28/16 update:  He is on carbidopa/levodopa 25/100, 4-5 tablets per day (usually 4) along with entacapone, 200 mg twice a day.  He also takes pramipexole, 1 mg 3 times per day and amantadine 100 mg 3 times a day for dyskinesia.  He had started taking carbidopa/levodopa 50/200, one tablet in the AM as he thinks that this "gives me a 3 hour window before it is done."  He usually doesn't take it but sometimes he does.    This is odd dosing but he has rearranged his own medication to best suite him.  He has been physical therapy since our last visit.  He does think that it was helpful.  A U Step walker was recommended.  He isn't using it while out and  asks me what he can do for falls.  He does state that "my biggest falls are at home."  He does keep the walker next to him at bedtime because he finds that this is the most troublesome time when he gets up.  He finds that the top of his body gets ahead of the legs and that is why he falls.  Noting some trouble moving in confined spaces.  Reports that "I changed the DBS to 3.2 to see if it would help" but it didn't.  He is going to trainer 4-5 days a week.  Asks  if okay to hunt quail/fish if with someone.    09/01/16 update:  The patient returns today for follow-up.  He is on carbidopa/levodopa 25/100, 4 tablets throughout the day (and then 50% of the time he will add 1/2 extra tablet bid) and entacapone, 200 mg twice per day.  He also takes carbidopa/levodopa 50/200, one tablet in the morning prn.  He remains on Mirapex, 1 mg 3 times a day and amantadine 100 mg 3 times a day, for dyskinesia.  He did call me after our last visit and stated that he was falling, but admitted that he was not using his walker.  He was encouraged to start doing so.    He states today "I am not going to use that thing except at night."  States that he reduced his brain stimulator and "that didn't work so I went back up."  He has been going to rock steady boxing and thought that had helped.  Takes klonopin 0.5 mg for RBD and that is working well.  Needs a refill  12/28/16 update:  The patient returns today for follow-up, accompanied by his wife and daughter who supplement the history.  He is on carbidopa/levodopa 25/100, 4 tablets throughout the day (and then 50% of the time he will add 1/2 extra tablet bid) and entacapone, 200 mg twice per day.  He also takes carbidopa/levodopa 50/200, one tablet at night.  He remains on Mirapex, 1 mg 3 times a day (was going to try and drop that but had trouble cutting that one) and amantadine 100 mg 3 times a day, for dyskinesia.  He is doing boxing 3 days a week.  He asks me again about the [employer]  device.  States that he increased the DBS and when asked why he stated that he was hoping it would help his freezing/walking/balance.  02/24/17 update:  Patient seen today in follow-up, accompanied by his wife who supplements the history.  Patient is on carbidopa/levodopa, 25/100, 4 tablets during the day and entacapone 200 mg twice per day.  He takes 2 extra 1/2 tablets of carbidopa/levodopa 25/100 during the day as needed.   He takes carbidopa/levodopa 50/200 at bedtime.  Sometimes he takes 1/2 of a time released tablet.   Last visit, I told him he could try and reduce his pramipexole from 1 mg 3 times per day to 0.5 mg 3 times per day.  He states that he did that and noted no difference.  He remains on amantadine 100 mg 3 times per day.  He has had no hallucinations.  He continues to exercise with boxing and he loves it.  In regards to falls, his wife states that he is falling more.  He won't use the walker.  He is on klonopin for RBD and "I take one or two" and "it works really well."  He has noted last week his battery has reached "ERI."  06/23/17 update:  Pt seen in f/u for PD.  This patient is accompanied in the office by his spouse who supplements the history.  Patient is on carbidopa/levodopa 25/100, 4 tablets in the day.  He takes entacapone with 2 of these dosages.  He is on carbidopa/levodopa 50/200 at bedtime.  He sometimes takes this bid (one in the AM and one in PM) Last visit, I gave him a weaning schedule to get off of pramipexole.  He states that "I take a little here and there."  He is only taking 1/2 tablet prn per  patient, but then when he gives me a schedule of how he takes his medication, he includes 3 pramipexole per day.  He is still on amantadine, 100 mg 3 times per day.  He is on clonazepam 0.5 mg, 1-2 tablets at night for REM behavior disorder.  He has not had any acting out of the dreams.  He asks about freezing.  He doesn't like to use the walker because he doesn't want to be dependent  on it.  He had to give up hunting because of falls.  He is doing well with RSB.  He asks about cannabis oil.  He asks me about giving him IV thiamine.    10/20/17 update: Patient seen today in follow-up.  This patient is accompanied in the office by his spouse who supplements the history. I have received numerous phone calls from the patient and some from the family since our last visit.  His daughter called about concerns about his driving, but did not want [place] to know that she called.  I recommended that she reveal her concerns to the [facility] to generate paperwork for me, but that has not been done.  Patient has called and asked about clinical trials for nilotinib, but they were not accepting new patients and that trial has been closed for new patients.  He called me both about freezing as well as dyskinesia.  He  called me about Inbrija.  He asks about this today and discussed that this will make his dyskinesia worse.  He still wants to consider. He got stuck one time in a restaurant.   He has met with our social worker in the office since our last visit.  He called and requested a referral to the interdisciplinary clinic at [facility], but when he was contacted he refused to make an appointment.  His daughter called back and has an appt in early May.   He requested transfer to a nurse practitioner in [place], but ultimately after discussion with his primary care physician, he decided to follow-up here.  Medication wise, he remains on carbidopa/levodopa 25/100, 4 tablets throughout the day (occasionally takes an extra) and he takes entacapone with 2 of these dosages.  He is no longer on carbidopa/levodopa 50/200 at bedtime.  He is on amantadine, 100 mg, 3 times per day.  Told him again last visit to discontinue the pramipexole.  Using alarmed pill box and wife states that they are finding this beneficial.  States that biggest c/o is "timing out."  He cannot describing what timing out means but after multiple questions,  may mean freezing.  Wife thinks that since meds more regular doing better but she does not some freezing.  She notes interference with protein when eating.  Going to boxing 3 days a week.  12/15/17 update: Patient is seen today in follow-up.  I have reviewed records since last visit.  He is accompanied by his wife who supplements history.  Patient saw [name] on Nov 17, 2016.  Those records are not yet completed.  I do have a new dosing schedule that she gave him which increased his levodopa from 4 tablets to 5 tablets/day by taking 2 tablets at 7:30 AM/1 tablet at 10 AM/1 tablet at 1:30 PM/1 tablet at 4 PM and 1 tablet at 7 PM.  He was to take entacapone with each of those dosages and amantadine 3 times per day.   He didn't find that taking the entacapone with each dose of levodopa helped and he  had leg cramping and so is back taking it with taking comtan every other dose.   Today, he didn't take the 2 carbidopa/levodopa 25/100 in the AM.   [name] scheduled an appointment with [name], which is now scheduled for December 30, 2017.  The patient wanted work to back into our clinic before that appointment with [name].  He was given samples of inbrija last visit and states that he could not afford the medication as it was $1600 per month.  States that he has an approval from his insurance.  He didn't qualify for patient assist.    PREVIOUS MEDICATIONS: Sinemet, Mirapex and azilect  ALLERGIES:   Allergies  Allergen Reactions  . Amoxicillin   . Celebrex [Celecoxib]   . Demerol Other (See Comments)    Hallucinations   . Penicillins     REACTION: RASH    CURRENT MEDICATIONS:  Current Outpatient Medications on File Prior to Visit  Medication Sig Dispense Refill  . amantadine (SYMMETREL) 100 MG capsule TAKE ONE CAPSULE THREE TIMES A DAY 90 capsule 5  . carbidopa-levodopa (SINEMET IR) 25-100 MG tablet TAKE 1 TABLET BY MOUTH 4 TIMES DAILY AND1/2 TABLET 1-2 TIMES DAILY 150 tablet 5  . clonazePAM  (KLONOPIN) 0.5 MG tablet TAKE 1-2 TABLETS BY MOUTH AT BEDTIME AS NEEDED 60 tablet 0  . cyclobenzaprine (FLEXERIL) 10 MG tablet Take 2 tablets (20 mg total) by mouth at bedtime. 60 tablet 5  . entacapone (COMTAN) 200 MG tablet Take 1 tablet (200 mg total) by mouth 2 (two) times daily. 180 tablet 1   No current facility-administered medications on file prior to visit.     PAST MEDICAL HISTORY:   Past Medical History:  Diagnosis Date  . Arthritis    left hip   . GERD (gastroesophageal reflux disease) Pre 2002   Mild  . History of kidney stones   . 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 Parkinson disease ([place]) 2004   Slowly progressive  . Ruptured appendix teens    PAST SURGICAL HISTORY:   Past Surgical History:  Procedure Laterality Date  . APPENDECTOMY    . CATARACT EXTRACTION W/ INTRAOCULAR LENS  IMPLANT, BILATERAL  2017  . DEEP BRAIN STIMULATOR PLACEMENT  8/14   L STN  . DOPPLER ECHOCARDIOGRAPHY  01/10/2009   LV NML Mild LVH EF 60-65% aortic sclerosis w/0 stenosis   . JOINT REPLACEMENT     left hip replacement 08/14/11/[place] [facility]   . Neuro evaluation  12/19/2004   [name], benign tremor  . SUBTHALAMIC STIMULATOR BATTERY REPLACEMENT Left 03/25/2017   Procedure: Deep Brain Stimulator battery replacement;  Surgeon: Erline Levine, MD;  Location: [place];  Service: Neurosurgery;  Laterality: Left;  left  . TONSILLECTOMY    . TOTAL HIP REVISION  08/27/2011   Procedure: TOTAL HIP REVISION;  Surgeon: Mauri Pole, MD;  Location: [facility];  Service: Orthopedics;  Laterality: Left;    SOCIAL HISTORY:   Social History   Socioeconomic History  . Marital status: Married    Spouse name: Not on file  . Number of children: 2  . Years of education: Not on file  . Highest education level: Not on file  Occupational History  . 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 Financial  resource strain: Not on file  . Food insecurity:    Worry: Not on file  Inability: Not on file  . Transportation needs:    Medical: Not on file    Non-medical: Not on file  Tobacco Use  . Smoking status: Never Smoker  . Smokeless tobacco: Never Used  Substance and Sexual Activity  . Alcohol use: Yes    Alcohol/week: 3.5 oz    Types: 7 Standard drinks or equivalent per week    Comment: occassionally  . Drug use: No  . Sexual activity: Yes  Lifestyle  . Physical activity:    Days per week: Not on file    Minutes per session: Not on file  . Stress: Not on file  Relationships  . Social connections:    Talks on phone: Not on file    Gets together: Not on file    Attends religious service: Not on file    Active member of club or organization: Not on file    Attends meetings of clubs or organizations: Not on file    Relationship status: Not on file  . Intimate partner violence:    Fear of current or ex partner: Not on file    Emotionally abused: Not on file    Physically abused: Not on file    Forced sexual activity: Not on file  Other Topics Concern  . Not on file  Social History Narrative   Married, lives with wife   2 daughters      Has living will   Wife has health care POA---then daughters   Would still accept CPR--but no prolonged artificial means (ventilator or tube feeds)    FAMILY HISTORY:   Family Status  Relation Name Status  . Mother  Deceased at age 58       knee replacement  . Father  Deceased at age 87       tremor  . Sister  Stage manager in [place], [place]  . Annamarie Major  (Not Specified)    ROS:  A complete 10 system review of systems was obtained and was unremarkable apart from what is mentioned above.  PHYSICAL EXAMINATION:    VITALS:   Vitals:   12/15/17 1425  BP: 108/60  Pulse: 76  SpO2: 96%  Weight: 155 lb (70.3 kg)  Height: 6' (1.829 m)   Wt Readings from Last 3 Encounters:  12/15/17 155 lb (70.3 kg)  10/20/17 163 lb  (73.9 kg)  06/23/17 164 lb (74.4 kg)    GEN:  The patient appears stated age and is in NAD. HEENT:  Normocephalic, atraumatic.  The mucous membranes are moist. The superficial temporal arteries are without ropiness or tenderness. CV:  RRR Lungs:  CTAB Neck/HEME:  There are no carotid bruits bilaterally.  Neurological examination:  Orientation: The patient is alert and oriented x3. Cranial nerves: There is good facial symmetry. The speech is fluent and clear. Soft palate rises symmetrically and there is no tongue deviation. Hearing is intact to conversational tone. Sensation: Sensation is intact to light touch throughout Motor: Strength is 5/5 in the bilateral upper and lower extremities.   Shoulder shrug is equal and symmetric.  There is no pronator drift.  Movement examination: Tone: There is normal tone in the bilateral upper extremities.  The tone in the lower extremities is normal.  Abnormal movements: There is dyskinesia of the R leg, overall moderate and more rare dyskinesia of the R shoulder. Coordination:  There is decreased RAM's with any form of RAMS, including alternating supination and pronation of the forearm,  hand opening and closing, finger taps, heel taps and toe taps, R more than L Gait and Station: The patient used his walker.  Still shuffles but unable to run/festinate with the walker so does better than in the past.   LABS    Chemistry      Component Value Date/Time   NA 139 03/23/2017 1442   NA 142 08/11/2016 1701   NA 139 08/15/2011 0535   K 4.2 03/23/2017 1442   K 3.8 08/15/2011 0535   CL 106 03/23/2017 1442   CL 101 08/15/2011 0535   CO2 28 03/23/2017 1442   CO2 29 08/15/2011 0535   BUN 17 03/23/2017 1442   BUN 18 08/11/2016 1701   BUN 12 08/15/2011 0535   CREATININE 0.82 03/23/2017 1442   CREATININE 0.73 08/15/2011 0535      Component Value Date/Time   CALCIUM 8.8 (L) 03/23/2017 1442   CALCIUM 7.6 (L) 08/15/2011 0535   ALKPHOS 86 08/11/2016 1701     AST 17 08/11/2016 1701   ALT 19 08/11/2016 1701   BILITOT 1.2 08/11/2016 1701     Lab Results  Component Value Date   TSH 2.82 03/16/2012    DBS programming was performed today which is described in more detail on a separate programming procedural notes.    ASSESSMENT/PLAN:  1. Parkinson's disease.    -The patient is status post left STN DBS at [facility] on 03/14/2013.    -remain on the carbidopa/levodopa 25/100,  4-5 tablets/day  -told him to try the schedule that [name] gave the patient so that he takes comtan 200 mg with each levodopa dosage.  Try it for 1-2 weeks.  He tried it for a day or two and thought that he had more leg cramping but that doesn't make much sense  -He is now off of carbidopa/levodopa 50/200, 1 daily.    -off of Pramipexole and should remain off of it.  - for now continue Amantadine, 100 mg tid.  May contribute to memory issues and may need to wean medication in the future.   -asks me about switching to directional lead.  Have discussed this previously.  There would be no advantage in him to do this.  There would potentially be an advantage to putting in lead on the opposite side given the fact that we have to balance medication and DBS currently.  However, not sure that he would be a surgical candidate now given the memory and cognitive changes that I have seen over the last year or so.  He has made an appt with [name] to discuss this on 12/30/17.  -insurance approved inbrija but it is still cost prohibitive.  I gave him samples to try.    -asks me again about nilotib and discussed clinical trial again  -Reiterated the importance of walker at all times.  -discussed duopa but size of pump is very large.  Discussed pump/patch system in trials  -referred for LSVT Big at [facility].  -information given on PWR program at [place] 2.  Memory change  -we scheduled neurocog testing but the patient cancelled it and didn't reschedule 3.  Constipation  -Was made worse  by amantadine.  He is using the rancho recipe.  He is using Biotene for the dry mouth but thinks that is from the amantadine as well and it may be. 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  F/u 4-6 months.  Much greater than 50% of this visit was spent in counseling and coordinating  care.  Total face to face time:  40 min not including DBS time.

## 2017-12-14 NOTE — Telephone Encounter (Signed)
Fax would not go through. I called assistance at number provided, they state they do not require any paperwork from a doctor's office. Patient made aware.

## 2017-12-14 NOTE — Telephone Encounter (Signed)
Patient states that we need to send a HIPPA form to the fax number [phone]. If you need a phone number for the asst funds company it is [phone]. He states that this needs to be done today it is time sensitive.  For [place]

## 2017-12-15 ENCOUNTER — Ambulatory Visit (INDEPENDENT_AMBULATORY_CARE_PROVIDER_SITE_OTHER): Payer: Medicare Other | Admitting: Neurology

## 2017-12-15 ENCOUNTER — Encounter: Payer: Self-pay | Admitting: Neurology

## 2017-12-15 VITALS — BP 108/60 | HR 76 | Ht 72.0 in | Wt 155.0 lb

## 2017-12-15 DIAGNOSIS — G4752 REM sleep behavior disorder: Secondary | ICD-10-CM | POA: Diagnosis not present

## 2017-12-15 DIAGNOSIS — G20 Parkinson's disease: Secondary | ICD-10-CM

## 2017-12-15 DIAGNOSIS — G20B1 Parkinson's disease with dyskinesia, without mention of fluctuations: Secondary | ICD-10-CM

## 2017-12-15 DIAGNOSIS — G20A1 Parkinson's disease without dyskinesia, without mention of fluctuations: Secondary | ICD-10-CM

## 2017-12-15 DIAGNOSIS — G249 Dystonia, unspecified: Secondary | ICD-10-CM | POA: Diagnosis not present

## 2017-12-15 MED ORDER — LEVODOPA 42 MG IN CAPS: 2.0000 | ORAL_CAPSULE | Freq: Every day | RESPIRATORY_TRACT | 0 refills | Status: DC | PRN

## 2017-12-15 NOTE — Procedures (Signed)
DBS Programming was performed.    Total time spent reviewing programming was 10 minutes.  Device was confirmed to be on.  Soft start was confirmed to be on.  Impedences were checked and were within normal limits.   All impedances were well within normal limits and are recorded on a separate worksheet.  Battery was checked and was determined to be functioning normally (2.95).  Final settings were as follows:  Left brain electrode, Group A:     2-C+; Amplitude  3.5   V   ; Pulse width 60 microseconds;   Frequency   130   Hz.  Left brain electrode, group B:  1-C+, amplitude: 3.2 V, PW: 90, frequency 140  Group B was active at the time pt left the office.  Right brain electrode:     n/a

## 2017-12-15 NOTE — Patient Instructions (Addendum)
1. You have been referred to Neuro Rehab for physical therapy at [facility]. They will call you directly to schedule an appointment.  Please call [phone] if you do not hear from them.

## 2017-12-16 ENCOUNTER — Telehealth: Payer: Self-pay | Admitting: Neurology

## 2017-12-16 ENCOUNTER — Other Ambulatory Visit: Payer: Self-pay | Admitting: Internal Medicine

## 2017-12-16 MED ORDER — ENTACAPONE 200 MG PO TABS: 200.0000 mg | ORAL_TABLET | Freq: Every day | ORAL | 1 refills | Status: DC

## 2017-12-16 NOTE — Telephone Encounter (Signed)
Dr. Carles Collet - you had told him to try this at his appt yesterday, but originally increased by [name]. Should this prescription come from her or you?

## 2017-12-16 NOTE — Telephone Encounter (Signed)
Last filled 11-15-17 #60 Last OV 05-11-17 No Future OV

## 2017-12-16 NOTE — Telephone Encounter (Signed)
Ok to give.  4-5 per day with each carbidopa/levodopa dose during the day

## 2017-12-16 NOTE — Telephone Encounter (Signed)
Amy Lmom from pharmacy needing to have a new prescription faxed in for his Entacapone medication due to an increase to 5 a day from what Mr. Nofsinger told them. She also mentioned Dr. Corinna Capra with [facility]. Thanks

## 2017-12-16 NOTE — Telephone Encounter (Signed)
RX sent

## 2017-12-30 DIAGNOSIS — Z6820 Body mass index (BMI) 20.0-20.9, adult: Secondary | ICD-10-CM | POA: Diagnosis not present

## 2017-12-30 DIAGNOSIS — G20 Parkinson's disease: Secondary | ICD-10-CM | POA: Diagnosis not present

## 2018-01-12 ENCOUNTER — Other Ambulatory Visit: Payer: Self-pay | Admitting: Neurology

## 2018-01-12 MED ORDER — CARBIDOPA-LEVODOPA 25-100 MG PO TABS: ORAL_TABLET | ORAL | 1 refills | Status: DC

## 2018-01-21 ENCOUNTER — Encounter: Payer: Self-pay | Admitting: Internal Medicine

## 2018-01-21 ENCOUNTER — Telehealth: Payer: Self-pay

## 2018-01-21 ENCOUNTER — Ambulatory Visit (INDEPENDENT_AMBULATORY_CARE_PROVIDER_SITE_OTHER): Payer: Medicare Other | Admitting: Internal Medicine

## 2018-01-21 VITALS — BP 122/76 | HR 77 | Temp 97.3°F | Ht 70.25 in | Wt 150.0 lb

## 2018-01-21 DIAGNOSIS — R350 Frequency of micturition: Secondary | ICD-10-CM | POA: Insufficient documentation

## 2018-01-21 LAB — POC URINALSYSI DIPSTICK (AUTOMATED)
Bilirubin, UA: 1
Glucose, UA: NEGATIVE
KETONES UA: 1
Nitrite, UA: NEGATIVE
PH UA: 6 (ref 5.0–8.0)
PROTEIN UA: POSITIVE — AB
RBC UA: NEGATIVE
SPEC GRAV UA: 1.02 (ref 1.010–1.025)
UROBILINOGEN UA: 0.2 U/dL

## 2018-01-21 MED ORDER — SULFAMETHOXAZOLE-TRIMETHOPRIM 800-160 MG PO TABS: 1.0000 | ORAL_TABLET | Freq: Two times a day (BID) | ORAL | 0 refills | Status: DC

## 2018-01-21 NOTE — Telephone Encounter (Signed)
Pt has appt with Dr Silvio Pate 01/21/18 at 9:15; pt has been arrived at office.

## 2018-01-21 NOTE — Telephone Encounter (Signed)
Here now Trying to wait for urine specimen--having difficulty

## 2018-01-21 NOTE — Assessment & Plan Note (Addendum)
Mostly at night No other signs of sig BPH or incomplete emptying Mild abnormalities in the urine---will treat empirically with septra If culture positive, will refer to urology Would consider finasteride if ongoing symptoms---reluctant to try tamsulosin with his Parkinsons

## 2018-01-21 NOTE — Telephone Encounter (Signed)
PLEASE NOTE: All timestamps contained within this report are represented as [place] Standard Time. CONFIDENTIALTY NOTICE: This fax transmission is intended only for the addressee. It contains information that is legally privileged, confidential or otherwise protected from use or disclosure. If you are not the intended recipient, you are strictly prohibited from reviewing, disclosing, copying using or disseminating any of this information or taking any action in reliance on or regarding this information. If you have received this fax in error, please notify us immediately by telephone so that we can arrange for its return to [place]. Phone: [phone], Toll-Free: [phone], Fax: [phone] Page: 1 of 1 Call Id: [id number] [place] Night - Client Nonclinical Telephone Record [place] Night - Client Client Site [place] Physician [name] - MD Contact Type Call Who Is Calling Patient / Member / Family / Caregiver Caller Name Casey Tucker Caller Phone Number [phone] Patient Name Casey Tucker Patient DOB [birth year] Call Type Message Only Information Provided Reason for Call Request to Schedule Office Appointment Initial Comment Caller states he has frequent urination at night, stream good, and wanting to schedule an appt for this issue. Additional Comment Provided information for a call back from the office. Call Closed By: Rosana Fret Transaction Date/Time: 01/20/2018 3:05:06 PM (ET)

## 2018-01-21 NOTE — Progress Notes (Signed)
Subjective:    Patient ID: Casey Tucker, male    DOB: Jan 15, 1941, 77 y.o.   MRN: [id number]  HPI Here due to urinary problems  Started about a month ago Going more often at night In past few nights---up like 5-7 times every night No daytime problems At night---very slow Gets dry mouth with comtan--but doesn't increase his drinking Flow seems fine Uses commode at night for safety  No dysuria No hematuria He does feel like he empties  Did have extensive evaluation at [facility] for the Parkinson's Getting new PT and OT some recent changes made in DBS  Current Outpatient Medications on File Prior to Visit  Medication Sig Dispense Refill  . amantadine (SYMMETREL) 100 MG capsule TAKE ONE CAPSULE THREE TIMES A DAY 90 capsule 5  . carbidopa-levodopa (SINEMET IR) 25-100 MG tablet 6 tablets daily as directed 540 tablet 1  . clonazePAM (KLONOPIN) 0.5 MG tablet TAKE 1 TO 2 TABLETS AT BEDTIME AS NEEDED 60 tablet 0  . cyclobenzaprine (FLEXERIL) 10 MG tablet Take 2 tablets (20 mg total) by mouth at bedtime. 60 tablet 5  . entacapone (COMTAN) 200 MG tablet Take 1 tablet (200 mg total) by mouth 5 (five) times daily. Take with Levodopa 150 tablet 1  . Levodopa (INBRIJA) 42 MG CAPS Place 2 capsules into inhaler and inhale 5 (five) times daily as needed. 32 capsule 0   No current facility-administered medications on file prior to visit.     Allergies  Allergen Reactions  . Amoxicillin   . Celebrex [Celecoxib]   . Demerol Other (See Comments)    Hallucinations   . Penicillins     REACTION: RASH    Past Medical History:  Diagnosis Date  . Arthritis    left hip   . GERD (gastroesophageal reflux disease) Pre 2002   Mild  . History of kidney stones   . 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 Parkinson disease ([place]) 2004   Slowly progressive  . Ruptured appendix teens    Past Surgical History:    Procedure Laterality Date  . APPENDECTOMY    . CATARACT EXTRACTION W/ INTRAOCULAR LENS  IMPLANT, BILATERAL  2017  . DEEP BRAIN STIMULATOR PLACEMENT  8/14   L STN  . DOPPLER ECHOCARDIOGRAPHY  01/10/2009   LV NML Mild LVH EF 60-65% aortic sclerosis w/0 stenosis   . JOINT REPLACEMENT     left hip replacement 08/14/11/[place]   . Neuro evaluation  12/19/2004   [name], benign tremor  . SUBTHALAMIC STIMULATOR BATTERY REPLACEMENT Left 03/25/2017   Procedure: Deep Brain Stimulator battery replacement;  Surgeon: Erline Levine, MD;  Location: [place];  Service: Neurosurgery;  Laterality: Left;  left  . TONSILLECTOMY    . TOTAL HIP REVISION  08/27/2011   Procedure: TOTAL HIP REVISION;  Surgeon: Mauri Pole, MD;  Location: [facility];  Service: Orthopedics;  Laterality: Left;    Family History  Problem Relation Age of Onset  . Arthritis Mother        knee replacement  . COPD Father        emphysema, smoker  . Heart disease Father        CHF  . Alcohol abuse Paternal Uncle     Social History   Socioeconomic History  . Marital status: Married    Spouse name: Not on file  . Number of children: 2  . Years of education:  Not on file  . Highest education level: Not on file  Occupational History  . 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 Financial resource strain: Not on file  . Food insecurity:    Worry: Not on file    Inability: Not on file  . Transportation needs:    Medical: Not on file    Non-medical: Not on file  Tobacco Use  . Smoking status: Never Smoker  . Smokeless tobacco: Never Used  Substance and Sexual Activity  . Alcohol use: Yes    Alcohol/week: 4.2 oz    Types: 7 Standard drinks or equivalent per week    Comment: occassionally  . Drug use: No  . Sexual activity: Yes  Lifestyle  . Physical activity:    Days per week: Not on file    Minutes per session: Not on file  . Stress: Not on file  Relationships  . Social connections:     Talks on phone: Not on file    Gets together: Not on file    Attends religious service: Not on file    Active member of club or organization: Not on file    Attends meetings of clubs or organizations: Not on file    Relationship status: Not on file  . Intimate partner violence:    Fear of current or ex partner: Not on file    Emotionally abused: Not on file    Physically abused: Not on file    Forced sexual activity: Not on file  Other Topics Concern  . Not on file  Social History Narrative   Married, lives with wife   2 daughters      Has living will   Wife has health care POA---then daughters   Would still accept CPR--but no prolonged artificial means (ventilator or tube feeds)   Review of Systems No fever Appetite is off---some "heavy problems" at home--wife's health concerns (so not eating as well)    Objective:   Physical Exam  Constitutional: He appears well-developed. No distress.  GI: Soft. There is no tenderness. There is no rebound and no guarding.  No suprapubic dullness  Musculoskeletal:  No CVA tenderness           Assessment & Plan:

## 2018-01-22 LAB — URINE CULTURE
MICRO NUMBER:: 90799225
SPECIMEN QUALITY: ADEQUATE

## 2018-01-27 ENCOUNTER — Telehealth: Payer: Self-pay

## 2018-01-27 MED ORDER — FINASTERIDE 5 MG PO TABS: 5.0000 mg | ORAL_TABLET | Freq: Every day | ORAL | 11 refills | Status: DC

## 2018-01-27 NOTE — Telephone Encounter (Signed)
See lab results. Rx sent for medication per lab result

## 2018-01-31 ENCOUNTER — Other Ambulatory Visit: Payer: Self-pay | Admitting: Internal Medicine

## 2018-01-31 ENCOUNTER — Telehealth: Payer: Self-pay

## 2018-01-31 NOTE — Telephone Encounter (Signed)
Okay to send finasteride for a year (if not already done)

## 2018-01-31 NOTE — Telephone Encounter (Signed)
It was already done. Spoke to pt. He will go get it.

## 2018-01-31 NOTE — Telephone Encounter (Signed)
Pt was seen 01/21/18 and noted in assessment & plan would consider finasteride if ongoing symptoms.

## 2018-01-31 NOTE — Telephone Encounter (Signed)
Copied from [place] [id number]. Topic: Inquiry >> Jan 31, 2018  7:38 AM Pricilla Handler wrote: Reason for CRM: Patient called wanting Dr. Silvio Pate to know that he would like to try a medication to "reduce prostate symptoms". Patient would like Dr. Silvio Pate to call him today at [phone].       Thank You!!!

## 2018-01-31 NOTE — Telephone Encounter (Signed)
Last filled 12-16-17 #60 Last CPE 05-11-17 Last OV Acute 01-21-18 No Future OV  [facility]

## 2018-02-14 ENCOUNTER — Telehealth: Payer: Self-pay | Admitting: Neurology

## 2018-02-14 MED ORDER — ENTACAPONE 200 MG PO TABS: 200.0000 mg | ORAL_TABLET | Freq: Every day | ORAL | 5 refills | Status: DC

## 2018-02-14 NOTE — Telephone Encounter (Signed)
Pt needs clarification on dosage of comtan. Pls call him

## 2018-02-14 NOTE — Telephone Encounter (Signed)
Pres[facility] to [facility] for a refill of Comtan 5 times daily. Patient made aware he can remain on that dosage.

## 2018-03-03 ENCOUNTER — Other Ambulatory Visit: Payer: Self-pay | Admitting: Internal Medicine

## 2018-03-03 NOTE — Progress Notes (Signed)
`   Casey Tucker was seen today in the movement disorders clinic for neurologic consultation at the request of [name], MD.  The consultation is for the evaluation of PD.  The records that were made available to me were reviewed.  Pt is accompanied by his wife who supplements the history.   Pt reports first sx was in 2006.  Pt is R hand dominant.  Pt states that his next door neighbor (Network engineer) noted that he wasn't swinging his arm and thought that he had a stroke. He subsequently saw [name] in about 2006 and he was dx with PD.  He was started on mirapex and azilect.  It helped the overall stiffness.  Tremor developed in the R arm around the same time as the diagnosis.  Levodopa was started in approximately 2009-2010.  He also started an exercise program and he believes that has made a huge difference.  He began to have some motor fluctuations and saw [name] and had his surgery done in 02/2013.  It was a unilateral implant, placed into the L STN.  He has noted that he is sleeping much better since surgery.  No tremor.  [name] programmed the DBS one week post op.  He f/u with the NP at [place], and he now has an A and a B setting.  He hates the A setting (doesn't walk well) and he "cranked" his own B setting up to 280 (assuming frequency) and this helped walking some but he still has problems with balance and freezing.  He is on mirapex, 1 mg tid.  He is on carbidopa/levodopa 25/100, 7:30am/11am/2pm/6pm.    11/15/13:  Pt returns today for follow up.  Overall, he has been doing well.  He is off of medication this AM for programming.  His wife reports that he has done better on his new setting.  Seems to be walking better with less dyskinesia.  Until this morning, he was still on the carbidopa/levodopa 25/100, 1 tablet 4 times per day, Azilect 1 mg daily and Mirapex 1 mg 3 times per day.  He is exercising faithfully.  He has not had any falls.  He has been shuffling a little more this  morning, since he is off of medication.  11/21/13 update:  The patient is returning for followup, earlier than expected.  The patient reports that he really had not been doing as well over the weekend.  He did not feel like he was walking as well. He felt like the setting "wore" off 30 mins before the next levodopa dosage.  His wife wonders if he wasn't doing as well because he was at the beach this past weekend and there were more stairs to negotiate.  No falls.  No hallucinations   He did decrease his carbidopa/levodopa 25/100, one tablet 3 times per day from one tablet 4 times per day.  He is still on Mirapex 1 mg 3 times per day and Azilect 1 mg daily.  12/20/13 update:  The patient returns today for followup, accompanied by his wife and daughter who supplements the history.  The patient is currently on Mirapex, 1 mg 3 times a day, Azilect 1 mg daily and carbidopa/levodopa 25/100 at 7:30 AM/11 AM/2 PM and 6:30 PM.  Overall, the patient states that he is doing much better on his current setting.  It is not seeming to "wear off."  He is having some difficulty in the middle of the night when he  gets up and sometimes has to use a cane, but otherwise is doing well.  No falls.  On one occasion, he did try to get up in the morning and go and take a walk and was able to walk a few miles without taking his medication, but then he needed it when he got home.  He has noticed some dyskinesia, but it has not been particularly bothersome.  He would like to decrease dyskinesia, if able.  03/20/14 update:  Pt is f//u today.  Overall doing well.  He tried to decrease his carbidopa/levodopa 25/100 from one tablet 4 times per day to 1/0.5/0.5/1, but ended up going back up to one tablet 4 times per day.  He finds that he is due for his second dose at 11 AM, but by 10:30 AM he is shuffling, and he thinks it is primarily the right foot.  When he goes hiking, he states that he takes an extra half tablet at 10:30 AM and as well and  often takes another half tablet in the afternoon.  He finds that protein interferes with the levodopa dosing.  He continues to take 2 Flexeril at night.  He does not know if it helps, but has not tried to stop that since the DBS surgery.  He finds that the only time he has dyskinesia is at the end of the dose.  He asks me if we can slightly increase the DBS stimulation.  He continues to exercise.  No falls.  No hallucinations.  05/18/14 update: The patient is following up today, accompanied by his wife who supplements the history.  I tried to slightly decrease the levodopa, but the patient is was not able to do that.  He remains on carbidopa/levodopa 25/100, 1 tablet 4 times per day.  He is also on Mirapex 1 mg 3 times per day along with Azilect 1 mg daily. C/o some "slight" freezing episodes and med wearing off 1/2 hr before due but at the same time having frequent dyskinesia of the R leg.  No falls.  Staying very active.  No hallucinations.  C/o cost of azilect and asks about its benefit.    08/08/14 update:  Overall, the patient is doing well.  He has not taken medication since 6:30 this morning, and wanted me to see him off of medication.  He states that he has some "spotty freezing."  He asks about potentially taking metformin to help him.  He also asks about levodopa nasal spray.  He asks about several other experimental treatments.  He has occasional dyskinesia in the right foot but it is not bothersome to him.  Overall, he feels slightly slower than he used to.  He states that he generally is supposed to take his levodopa at 11 AM but he needs it by 10:30.  If he is very active he will need to take a few extra half dosages of levodopa throughout the day.  Generally, however, he takes 4 tablets of carbidopa/levodopa 25/100 throughout the day in addition to entacapone 200 mg twice a day and Mirapex 1 mg 3 times a day.  He is off of Azilect and wants to know if he should go back on it.  He noticed no  difference when discontinuing it.  He is off of Flexeril as well as noticed no trouble with that.  10/10/14 update:  Overall, the patient is doing well.  He is on carbidopa/levodopa 25/100 4 times per day.  He takes entacapone 200 mg with  the first and third dose of the levodopa.  He really does not notice that it helps and states that he still feels "jerky" 30 minutes before his next dosage is due.  He will often just take the levodopa and then.  He is on Mirapex 1 mg 3 times a day.  I added carbidopa/levodopa 50/200 CR at night because of cramping and the patient states that it helps but he rarely takes it because he thinks the dyskinesia that worse when he did it.  He states that he uses it if he really needs to "get up and go."  He does use Flexeril at night for cramping and thinks it helped as much as the carbidopa/levodopa 50/200 CR.  He did turn down the DBS device somewhat since last visit because of dyskinesia.  He states that he takes Azilect "off and on" because he was told by a previous neurologist that it was neuroprotective.  He asks me about isradapine.  02/12/15 update:  The patient returns today for follow-up.  I have communicated with him several times since last visit.  He called with complaints of freezing or before his next dose and we ended up slightly increasing his levodopa so that he is taking 1-1/2 tablets in the morning, one tablet at his next dose, 1-1/2 tablets in the mid afternoon and then 1 tablet in the later evening.  This helped but he states that sometimes he will take 1 in the AM and the 1/2 an hour or 2 later.  He still only takes 3-4 a day.  He takes entacapone with every other dose of the levodopa.  He is on pramipexole 1 mg 3 times per day.  Dyskinesia has been problematic despite the fact he complains about freezing and we ended up adding amantadine, 100 mg 3 times a day for that. It helped that but caused constipation and dry mouth.   He had 2 falls since last visit, one  of which was at the outer edge of my office when he came to bring me something.  He quickly turned around while carrying something and fell to his knee.  He did not get hurt.   He states that if he gets in a crowded space his feet will just get tangled together.   He does know that if he gets into "shuffle mode" he needs to stop and "reset."  He states overall shuffling is much better.  He asked me about several studies since our last visit, but these are not available to him because of the fact he has already had DBS.  He asks me again about nilotinib study.  05/15/15 update:  The patient is following up today regarding his Parkinson's disease.  States that he has been "fantastic."  He did have a postoperative MRI done since our last visit.  This is first he has ever had since his surgery.  The [facility] plane is approximately at contact number 2 on the lead.  We are currently actively using contact 1.  We have used contact 2 in the past successfully.  He is currently on carbidopa/levodopa 25/100; he has gone down a bit on this to about 4 and sometimes 4.5 tablets a day.  Is on Comtan 200 mg twice a day, Mirapex 1 mg 3 times a day and amantadine 100 mg 3 times a day for dyskinesia.  If he gets nervous, his legs will "twitch."  He is exercising on the bike in addition to doing physical labor.  Dropped his own programming to 3.2 and thinks that it has helped.  Did have 1-2 falls when "I get in a hurry."  Never got hurt.  Notices more trouble when he is at end of dosage.  08/15/15 update:  The patient is following up today regarding his Parkinson's disease.  He is on carbidopa/levodopa 25/100, 4- tablets per day (and then will take 2 extra 1/2 tablets as needed) along with entacapone, 200 mg twice a day.  He also takes pramipexole, 1 mg 3 times per day and amantadine 100 mg 3 times a day for dyskinesia.   Amantadine is causing dry mouth and he is using biotene to help that.   He has called several times since last  visit asking me about medical marijuana and its place in the treatment of Parkinson's disease.  I have talked to him about the position statement by the [employer] [place] [employer].  He has not fallen since our last visit.  He reports increased dyskinesia only when he has increased stress.  Reports that he changed his DBS setting again back to where I had it previously (3.4).  He uses 2 flexeril at night for cramping.  He brings me articles to read on natural "supplements" and also asks me to look up a center in [place] that is owned by a "chiropractic neurologist" and has week long "camps" for patient to have Parkinson's disease that are supposed to help with healing of Parkinson's disease and not paid by insurance.  12/13/15 update:  The patient is following up today regarding his Parkinson's disease.  He is on carbidopa/levodopa 25/100, 4-5 tablets per day along with entacapone, 200 mg twice a day.  He also takes pramipexole, 1 mg 3 times per day and amantadine 100 mg 3 times a day for dyskinesia.  I reviewed records since last visit.  Fell at home on concrete on 11/30/15 and injured L calf.  Saw PCP few days later. Is better now.  States that he has learned that he just has to "stop" when he has a festinating gait.   No hallucinations.  No lightheadedness.    04/28/16 update:  He is on carbidopa/levodopa 25/100, 4-5 tablets per day (usually 4) along with entacapone, 200 mg twice a day.  He also takes pramipexole, 1 mg 3 times per day and amantadine 100 mg 3 times a day for dyskinesia.  He had started taking carbidopa/levodopa 50/200, one tablet in the AM as he thinks that this "gives me a 3 hour window before it is done."  He usually doesn't take it but sometimes he does.    This is odd dosing but he has rearranged his own medication to best suite him.  He has been physical therapy since our last visit.  He does think that it was helpful.  A U Step walker was recommended.  He isn't using it while out and  asks me what he can do for falls.  He does state that "my biggest falls are at home."  He does keep the walker next to him at bedtime because he finds that this is the most troublesome time when he gets up.  He finds that the top of his body gets ahead of the legs and that is why he falls.  Noting some trouble moving in confined spaces.  Reports that "I changed the DBS to 3.2 to see if it would help" but it didn't.  He is going to trainer 4-5 days a week.  Asks  if okay to hunt quail/fish if with someone.    09/01/16 update:  The patient returns today for follow-up.  He is on carbidopa/levodopa 25/100, 4 tablets throughout the day (and then 50% of the time he will add 1/2 extra tablet bid) and entacapone, 200 mg twice per day.  He also takes carbidopa/levodopa 50/200, one tablet in the morning prn.  He remains on Mirapex, 1 mg 3 times a day and amantadine 100 mg 3 times a day, for dyskinesia.  He did call me after our last visit and stated that he was falling, but admitted that he was not using his walker.  He was encouraged to start doing so.    He states today "I am not going to use that thing except at night."  States that he reduced his brain stimulator and "that didn't work so I went back up."  He has been going to rock steady boxing and thought that had helped.  Takes klonopin 0.5 mg for RBD and that is working well.  Needs a refill  12/28/16 update:  The patient returns today for follow-up, accompanied by his wife and daughter who supplement the history.  He is on carbidopa/levodopa 25/100, 4 tablets throughout the day (and then 50% of the time he will add 1/2 extra tablet bid) and entacapone, 200 mg twice per day.  He also takes carbidopa/levodopa 50/200, one tablet at night.  He remains on Mirapex, 1 mg 3 times a day (was going to try and drop that but had trouble cutting that one) and amantadine 100 mg 3 times a day, for dyskinesia.  He is doing boxing 3 days a week.  He asks me again about the [employer]  device.  States that he increased the DBS and when asked why he stated that he was hoping it would help his freezing/walking/balance.  02/24/17 update:  Patient seen today in follow-up, accompanied by his wife who supplements the history.  Patient is on carbidopa/levodopa, 25/100, 4 tablets during the day and entacapone 200 mg twice per day.  He takes 2 extra 1/2 tablets of carbidopa/levodopa 25/100 during the day as needed.   He takes carbidopa/levodopa 50/200 at bedtime.  Sometimes he takes 1/2 of a time released tablet.   Last visit, I told him he could try and reduce his pramipexole from 1 mg 3 times per day to 0.5 mg 3 times per day.  He states that he did that and noted no difference.  He remains on amantadine 100 mg 3 times per day.  He has had no hallucinations.  He continues to exercise with boxing and he loves it.  In regards to falls, his wife states that he is falling more.  He won't use the walker.  He is on klonopin for RBD and "I take one or two" and "it works really well."  He has noted last week his battery has reached "ERI."  06/23/17 update:  Pt seen in f/u for PD.  This patient is accompanied in the office by his spouse who supplements the history.  Patient is on carbidopa/levodopa 25/100, 4 tablets in the day.  He takes entacapone with 2 of these dosages.  He is on carbidopa/levodopa 50/200 at bedtime.  He sometimes takes this bid (one in the AM and one in PM) Last visit, I gave him a weaning schedule to get off of pramipexole.  He states that "I take a little here and there."  He is only taking 1/2 tablet prn per  patient, but then when he gives me a schedule of how he takes his medication, he includes 3 pramipexole per day.  He is still on amantadine, 100 mg 3 times per day.  He is on clonazepam 0.5 mg, 1-2 tablets at night for REM behavior disorder.  He has not had any acting out of the dreams.  He asks about freezing.  He doesn't like to use the walker because he doesn't want to be dependent  on it.  He had to give up hunting because of falls.  He is doing well with RSB.  He asks about cannabis oil.  He asks me about giving him IV thiamine.    10/20/17 update: Patient seen today in follow-up.  This patient is accompanied in the office by his spouse who supplements the history. I have received numerous phone calls from the patient and some from the family since our last visit.  His daughter called about concerns about his driving, but did not want [place] to know that she called.  I recommended that she reveal her concerns to the [facility] to generate paperwork for me, but that has not been done.  Patient has called and asked about clinical trials for nilotinib, but they were not accepting new patients and that trial has been closed for new patients.  He called me both about freezing as well as dyskinesia.  He  called me about Inbrija.  He asks about this today and discussed that this will make his dyskinesia worse.  He still wants to consider. He got stuck one time in a restaurant.   He has met with our social worker in the office since our last visit.  He called and requested a referral to the interdisciplinary clinic at [facility], but when he was contacted he refused to make an appointment.  His daughter called back and has an appt in early May.   He requested transfer to a nurse practitioner in [place], but ultimately after discussion with his primary care physician, he decided to follow-up here.  Medication wise, he remains on carbidopa/levodopa 25/100, 4 tablets throughout the day (occasionally takes an extra) and he takes entacapone with 2 of these dosages.  He is no longer on carbidopa/levodopa 50/200 at bedtime.  He is on amantadine, 100 mg, 3 times per day.  Told him again last visit to discontinue the pramipexole.  Using alarmed pill box and wife states that they are finding this beneficial.  States that biggest c/o is "timing out."  He cannot describing what timing out means but after multiple questions,  may mean freezing.  Wife thinks that since meds more regular doing better but she does not some freezing.  She notes interference with protein when eating.  Going to boxing 3 days a week.  12/15/17 update: Patient is seen today in follow-up.  I have reviewed records since last visit.  He is accompanied by his wife who supplements history.  Patient saw [name] on Nov 17, 2016.  Those records are not yet completed.  I do have a new dosing schedule that she gave him which increased his levodopa from 4 tablets to 5 tablets/day by taking 2 tablets at 7:30 AM/1 tablet at 10 AM/1 tablet at 1:30 PM/1 tablet at 4 PM and 1 tablet at 7 PM.  He was to take entacapone with each of those dosages and amantadine 3 times per day.   He didn't find that taking the entacapone with each dose of levodopa helped and he  had leg cramping and so is back taking it with taking comtan every other dose.   Today, he didn't take the 2 carbidopa/levodopa 25/100 in the AM.   [name] scheduled an appointment with [name], which is now scheduled for December 30, 2017.  The patient wanted work to back into our clinic before that appointment with [name].  He was given samples of inbrija last visit and states that he could not afford the medication as it was $1600 per month.  States that he has an approval from his insurance.  He didn't qualify for patient assist.    03/08/18 update: Patient is seen today in follow-up for Parkinson's disease.  He is accompanied by his son in law (never been here) who supplements the history.  He has seen Dr. Orland Mustard at [facility] since our last visit.  I have reviewed those records.  His programming was changed somewhat.  Like me, Dr. Orland Mustard was hesitant to recommend DBS for the other side because he was concerned about cognitive impairment.  Pt reports dyskinesia is better and son in law reports it is as well.  He has it when stressed or pushed.  He is freezing in the AM.   Patient remains on carbidopa/levodopa  25/100, 2 tablets at 7:30 AM/1 tablet at 10 AM/1 tablet at 1:00 PM/1 tablet at 4 PM and 1 tablet at 7 PM.  Told him to try last visit the schedule that [name] had given him for taking entacapone with each levodopa dose.   He is still on amantadine, 100 mg twice per day.  Pt states that falls are reduced as "I have learned how to manage it."  Son in law states that falls usually occur when doing things that he shouldn't be doing. No hallucinations.  No lightheadedness/near syncope.  He quit boxing as he felt that it interrupted his med regimen.  Is riding stationary bike.  Asks about having DBS surgery on the other side.  Family is concerned about driving.  Patient is concerned about his wife who he feels has dementia  PREVIOUS MEDICATIONS: Sinemet, Mirapex and azilect  ALLERGIES:   Allergies  Allergen Reactions  . Amoxicillin   . Celebrex [Celecoxib]   . Demerol Other (See Comments)    Hallucinations   . Penicillins     REACTION: RASH    CURRENT MEDICATIONS:  Current Outpatient Medications on File Prior to Visit  Medication Sig Dispense Refill  . amantadine (SYMMETREL) 100 MG capsule TAKE ONE CAPSULE THREE TIMES A DAY (Patient taking differently: 1 at 7:30 am, 1 at 1pm, 1 at 4pm) 90 capsule 5  . carbidopa-levodopa (SINEMET IR) 25-100 MG tablet 6 tablets daily as directed (Patient taking differently: 2 at 7:30am, 1 at 10am, 1 at 1pm, 1 at 4pm, 1 at 7pm) 540 tablet 1  . clonazePAM (KLONOPIN) 0.5 MG tablet TAKE 1-2 TABLETS BY MOUTH AT BEDTIME AS NEEDED (Patient taking differently: Take 1 mg by mouth at bedtime. ) 60 tablet 0  . cyclobenzaprine (FLEXERIL) 10 MG tablet Take 2 tablets (20 mg total) by mouth at bedtime. 60 tablet 5  . entacapone (COMTAN) 200 MG tablet Take 1 tablet (200 mg total) by mouth 5 (five) times daily. Take with Levodopa 150 tablet 5  . finasteride (PROSCAR) 5 MG tablet Take 1 tablet (5 mg total) by mouth daily. 30 tablet 11   No current facility-administered  medications on file prior to visit.     PAST MEDICAL HISTORY:   Past  Medical History:  Diagnosis Date  . Arthritis    left hip   . GERD (gastroesophageal reflux disease) Pre 2002   Mild  . History of kidney stones   . 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 Parkinson disease ([place]) 2004   Slowly progressive  . Ruptured appendix teens    PAST SURGICAL HISTORY:   Past Surgical History:  Procedure Laterality Date  . APPENDECTOMY    . CATARACT EXTRACTION W/ INTRAOCULAR LENS  IMPLANT, BILATERAL  2017  . DEEP BRAIN STIMULATOR PLACEMENT  8/14   L STN  . DOPPLER ECHOCARDIOGRAPHY  01/10/2009   LV NML Mild LVH EF 60-65% aortic sclerosis w/0 stenosis   . JOINT REPLACEMENT     left hip replacement 08/14/11/[facility]   . Neuro evaluation  12/19/2004   [name], benign tremor  . SUBTHALAMIC STIMULATOR BATTERY REPLACEMENT Left 03/25/2017   Procedure: Deep Brain Stimulator battery replacement;  Surgeon: Erline Levine, MD;  Location: [facility];  Service: Neurosurgery;  Laterality: Left;  left  . TONSILLECTOMY    . TOTAL HIP REVISION  08/27/2011   Procedure: TOTAL HIP REVISION;  Surgeon: Mauri Pole, MD;  Location: [facility];  Service: Orthopedics;  Laterality: Left;    SOCIAL HISTORY:   Social History   Socioeconomic History  . Marital status: Married    Spouse name: Not on file  . Number of children: 2  . Years of education: Not on file  . Highest education level: Not on file  Occupational History  . 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 Financial resource strain: Not on file  . Food insecurity:    Worry: Not on file    Inability: Not on file  . Transportation needs:    Medical: Not on file    Non-medical: Not on file  Tobacco Use  . Smoking status: Never Smoker  . Smokeless tobacco: Never Used  Substance and Sexual Activity  . Alcohol use: Yes    Alcohol/week: 7.0  standard drinks    Types: 7 Standard drinks or equivalent per week    Comment: occassionally  . Drug use: No  . Sexual activity: Yes  Lifestyle  . Physical activity:    Days per week: Not on file    Minutes per session: Not on file  . Stress: Not on file  Relationships  . Social connections:    Talks on phone: Not on file    Gets together: Not on file    Attends religious service: Not on file    Active member of club or organization: Not on file    Attends meetings of clubs or organizations: Not on file    Relationship status: Not on file  . Intimate partner violence:    Fear of current or ex partner: Not on file    Emotionally abused: Not on file    Physically abused: Not on file    Forced sexual activity: Not on file  Other Topics Concern  . Not on file  Social History Narrative   Married, lives with wife   2 daughters      Has living will   Wife has health care POA---then daughters   Would still accept CPR--but no prolonged artificial means (ventilator or tube feeds)    FAMILY HISTORY:   Family Status  Relation Name Status  . Mother  Deceased at age 37  knee replacement  . Father  Deceased at age 96       tremor  . Sister  Stage manager in [place], [place]  . Annamarie Major  (Not Specified)    ROS:  A complete 10 system review of systems was obtained and was unremarkable apart from what is mentioned above.  PHYSICAL EXAMINATION:    VITALS:   Vitals:   03/08/18 1107  BP: 130/60  Pulse: 70  SpO2: 99%   Wt Readings from Last 3 Encounters:  03/07/18 154 lb (69.9 kg)  01/21/18 150 lb (68 kg)  12/15/17 155 lb (70.3 kg)    GEN:  The patient appears stated age and is in NAD. HEENT:  Normocephalic, atraumatic.  The mucous membranes are moist. The superficial temporal arteries are without ropiness or tenderness. CV:  RRR Lungs:  CTAB Neck/HEME:  There are no carotid bruits bilaterally.  Neurological examination:  Orientation: The patient is alert  and oriented x3. Cranial nerves: There is good facial symmetry. The speech is fluent and clear. Soft palate rises symmetrically and there is no tongue deviation. Hearing is intact to conversational tone. Sensation: Sensation is intact to light touch throughout Motor: Strength is 5/5 in the bilateral upper and lower extremities.   Shoulder shrug is equal and symmetric.  There is no pronator drift.  Movement examination: Tone: There is normal tone in the bilateral upper extremities.  The tone in the lower extremities is normal.  Abnormal movements: There is no dyskinesia today. Coordination:  There is decreased RAMs, with any form of RAMS, including alternating supination and pronation of the forearm, hand opening and closing, finger taps, heel taps and toe taps. Gait and Station: The patient pushes off of the transport chair.  He shuffles, but does not festinate as much today.  He is not running down the hall like he usually does.   LABS    Chemistry      Component Value Date/Time   NA 139 03/23/2017 1442   NA 142 08/11/2016 1701   NA 139 08/15/2011 0535   K 4.2 03/23/2017 1442   K 3.8 08/15/2011 0535   CL 106 03/23/2017 1442   CL 101 08/15/2011 0535   CO2 28 03/23/2017 1442   CO2 29 08/15/2011 0535   BUN 17 03/23/2017 1442   BUN 18 08/11/2016 1701   BUN 12 08/15/2011 0535   CREATININE 0.82 03/23/2017 1442   CREATININE 0.73 08/15/2011 0535      Component Value Date/Time   CALCIUM 8.8 (L) 03/23/2017 1442   CALCIUM 7.6 (L) 08/15/2011 0535   ALKPHOS 86 08/11/2016 1701   AST 17 08/11/2016 1701   ALT 19 08/11/2016 1701   BILITOT 1.2 08/11/2016 1701     Lab Results  Component Value Date   TSH 2.82 03/16/2012    DBS programming was performed today which is described in more detail on a separate programming procedural notes.    ASSESSMENT/PLAN:  1. Parkinson's disease.    -The patient is status post left STN DBS at [facility] on 03/14/2013.    -Continue carbidopa/levodopa 25/100,  2 tablets at 7:30 AM/1 tablet at 10 AM/1 tablet at 1:00 PM/1 tablet at 4 PM and 1 tablet at 7 PM.  Take entacapone with each of these dosages.  He can take a single extra tablet as needed, but I told him not to take more than that throughout the day.  -Restart carbidopa/levodopa 50/200 for morning on.  -off of  Pramipexole and should remain off of it.  - for now continue Amantadine, 100 mg tid.  May contribute to memory issues and may need to wean medication in the future.   -insurance approved inbrija but it is still cost prohibitive.  I gave him samples to try.    -Patient asks me about Duopa.  We have discussed this in the past and he did not want it, but was thinking about reconsidering.  Talk to him again about its benefits and downfalls.  Ultimately, he really did not want to try it given the size of the pump.  Again discussed pump/patch systems in trial.  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  -Patient asked about DBS therapy for the other side of the brain.  Discussed with him again that I think that memory would make this prohibitive.  He would need neurocognitive testing, which he does not wish to complete.  Dr. Valere Dross was concerned about his memory as well. 2.  Memory change  -we scheduled neurocog testing but the patient cancelled it and didn't reschedule.  Discussed that today, but he does not wish to proceed.  -Discussed with the patient that I do not want him driving unless he passes a driving evaluation.  Family with him today agrees.  They are going to try and find alternative situations.  -Discussed with the patient that I want he and his family to talk about alternative living situations.  I am not sure that he/his wife should continue to be living alone. 3.  Constipation  -Was made worse by amantadine.  He is using the rancho recipe.  He is using Biotene for the dry mouth but thinks that is from the amantadine as well and it may be. 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  Follow-up in  the next 4 months, sooner should new neurologic issues arise.  Greater than 50% of the 40-minute visit was spent in counseling with the patient.  Time did not include DBS time.

## 2018-03-03 NOTE — Telephone Encounter (Signed)
Last filled 01-31-18 #60 Last OV 05-11-17 No Future OV

## 2018-03-07 ENCOUNTER — Telehealth: Payer: Self-pay | Admitting: Urology

## 2018-03-07 ENCOUNTER — Encounter: Payer: Self-pay | Admitting: Urology

## 2018-03-07 ENCOUNTER — Ambulatory Visit (INDEPENDENT_AMBULATORY_CARE_PROVIDER_SITE_OTHER): Payer: Medicare Other | Admitting: Urology

## 2018-03-07 VITALS — BP 117/69 | HR 89 | Resp 16 | Ht 70.0 in | Wt 154.0 lb

## 2018-03-07 DIAGNOSIS — K5909 Other constipation: Secondary | ICD-10-CM

## 2018-03-07 DIAGNOSIS — R350 Frequency of micturition: Secondary | ICD-10-CM | POA: Diagnosis not present

## 2018-03-07 LAB — BLADDER SCAN AMB NON-IMAGING: Scan Result: 35

## 2018-03-07 NOTE — Progress Notes (Signed)
03/07/2018 9:20 PM   Casey Tucker 1940-12-14 [id number]  Referring provider: [name], MD [address] [place], [place] [address]  Chief Complaint  Patient presents with  . Urinary Urgency    frequency onset 2 weeks    HPI: Patient is a 77 year old Caucasian male with Parkinson's disease who presents today for an urgent appointment due to frequency and swelling with his wife, Casey Tucker.  He states he feels like someone has put a rod up his rectum for the last week.  He states he performed a self exam of his prostate and it was hard.    He states he has been experiencing strong urgency, occasional 4/10 dysuria, nocturia x 6-10 and incontinence.  This started six weeks ago.  Patient denies any gross hematuria, dysuria or suprapubic/flank pain.  Patient denies any fevers, chills, nausea or vomiting.   His PVR is 35 mL.    He does drink some water and orange juice during the day.    He has not had a bowel movement for the last 4 days.  PMH: Past Medical History:  Diagnosis Date  . Arthritis    left hip   . GERD (gastroesophageal reflux disease) Pre 2002   Mild  . History of kidney stones   . 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 Parkinson disease ([place]) 2004   Slowly progressive  . Ruptured appendix teens    Surgical History: Past Surgical History:  Procedure Laterality Date  . APPENDECTOMY    . CATARACT EXTRACTION W/ INTRAOCULAR LENS  IMPLANT, BILATERAL  2017  . DEEP BRAIN STIMULATOR PLACEMENT  8/14   L STN  . DOPPLER ECHOCARDIOGRAPHY  01/10/2009   LV NML Mild LVH EF 60-65% aortic sclerosis w/0 stenosis   . JOINT REPLACEMENT     left hip replacement 08/14/11/[place]   . Neuro evaluation  12/19/2004   [name], benign tremor  . SUBTHALAMIC STIMULATOR BATTERY REPLACEMENT Left 03/25/2017   Procedure: Deep Brain Stimulator battery replacement;  Surgeon: Erline Levine, MD;   Location: [facility];  Service: Neurosurgery;  Laterality: Left;  left  . TONSILLECTOMY    . TOTAL HIP REVISION  08/27/2011   Procedure: TOTAL HIP REVISION;  Surgeon: Mauri Pole, MD;  Location: [facility];  Service: Orthopedics;  Laterality: Left;    Home Medications:  Allergies as of 03/07/2018      Reactions   Amoxicillin    Celebrex [celecoxib]    Demerol Other (See Comments)   Hallucinations    Penicillins    REACTION: RASH      Medication List        Accurate as of 03/07/18 11:59 PM. Always use your most recent med list.          amantadine 100 MG capsule Commonly known as:  SYMMETREL TAKE ONE CAPSULE THREE TIMES A DAY   carbidopa-levodopa 25-100 MG tablet Commonly known as:  SINEMET IR 6 tablets daily as directed   clonazePAM 0.5 MG tablet Commonly known as:  KLONOPIN TAKE 1-2 TABLETS BY MOUTH AT BEDTIME AS NEEDED   cyclobenzaprine 10 MG tablet Commonly known as:  FLEXERIL Take 2 tablets (20 mg total) by mouth at bedtime.   entacapone 200 MG tablet Commonly known as:  COMTAN Take 1 tablet (200 mg total) by mouth 5 (five) times daily. Take with Levodopa   finasteride 5 MG tablet Commonly known as:  PROSCAR Take  1 tablet (5 mg total) by mouth daily.   Levodopa 42 MG Caps Place 2 capsules into inhaler and inhale 5 (five) times daily as needed.   sulfamethoxazole-trimethoprim 800-160 MG tablet Commonly known as:  BACTRIM DS,SEPTRA DS Take 1 tablet by mouth 2 (two) times daily.       Allergies:  Allergies  Allergen Reactions  . Amoxicillin   . Celebrex [Celecoxib]   . Demerol Other (See Comments)    Hallucinations   . Penicillins     REACTION: RASH    Family History: Family History  Problem Relation Age of Onset  . Arthritis Mother        knee replacement  . COPD Father        emphysema, smoker  . Heart disease Father        CHF  . Alcohol abuse Paternal Uncle     Social History:  reports that he has never smoked. He has never used smokeless  tobacco. He reports that he drinks about 7.0 standard drinks of alcohol per week. He reports that he does not use drugs.  ROS: UROLOGY Frequent Urination?: Yes Hard to postpone urination?: Yes Burning/pain with urination?: Yes(occasional) Get up at night to urinate?: Yes Leakage of urine?: No Urine stream starts and stops?: No Trouble starting stream?: No Do you have to strain to urinate?: No Blood in urine?: No Urinary tract infection?: No Sexually transmitted disease?: No Injury to kidneys or bladder?: No Painful intercourse?: No Weak stream?: No Erection problems?: No Penile pain?: No  Gastrointestinal Nausea?: No Vomiting?: No Indigestion/heartburn?: No Diarrhea?: No Constipation?: Yes  Constitutional Fever: No Night sweats?: No Weight loss?: Yes Fatigue?: Yes  Skin Skin rash/lesions?: No Itching?: No  Eyes Blurred vision?: No Double vision?: No  Ears/Nose/Throat Sore throat?: No Sinus problems?: No  Hematologic/Lymphatic Swollen glands?: No Easy bruising?: Yes  Cardiovascular Leg swelling?: No Chest pain?: No  Respiratory Cough?: No Shortness of breath?: No  Endocrine Excessive thirst?: No  Musculoskeletal Back pain?: No Joint pain?: No  Neurological Headaches?: No Dizziness?: No  Psychologic Depression?: No Anxiety?: No  Physical Exam: BP 117/69   Pulse 89   Resp 16   Ht 5\' 10"  (1.778 m)   Wt 154 lb (69.9 kg)   BMI 22.10 kg/m   Constitutional:  Well nourished. Alert and oriented, No acute distress. HEENT: [place] AT, moist mucus membranes.  Trachea midline, no masses. Cardiovascular: No clubbing, cyanosis, or edema. Respiratory: Normal respiratory effort, no increased work of breathing. GI: Abdomen is soft, non tender, non distended, no abdominal masses. Liver and spleen not palpable.  No hernias appreciated.  Stool sample for occult testing is not indicated.   GU: No CVA tenderness.  No bladder fullness or masses.  Patient with  uncircumcised phallus.  Foreskin easily retracted Urethral meatus is patent.  No penile discharge. No penile lesions or rashes. Scrotum without lesions, cysts, rashes and/or edema.  Testicles are located scrotally bilaterally. No masses are appreciated in the testicles. Left and right epididymis are normal. Rectal: Patient with  normal sphincter tone. Anus and perineum without scarring or rashes. No rectal masses are appreciated. Rectal vault full of stool.  Prostate is approximately 45 grams, no nodules are appreciated. Seminal vesicles are normal. Skin: No rashes, bruises or suspicious lesions. Lymph: No cervical or inguinal adenopathy. Neurologic: Grossly intact, no focal deficits, moving all 4 extremities. Psychiatric: Normal mood and affect.  Laboratory Data: Lab Results  Component Value Date   WBC 5.8 03/23/2017  HGB 10.4 (L) 03/23/2017   HCT 30.4 (L) 03/23/2017   MCV 110.1 (H) 03/23/2017   PLT 238 03/23/2017    Lab Results  Component Value Date   CREATININE 0.82 03/23/2017    Lab Results  Component Value Date   PSA 0.73 01/28/2011   PSA 0.71 01/01/2010   PSA 0.79 12/31/2008    No results found for: TESTOSTERONE  No results found for: HGBA1C  Lab Results  Component Value Date   TSH 2.82 03/16/2012       Component Value Date/Time   CHOL 166 03/16/2012 1029   HDL 62.10 03/16/2012 1029   CHOLHDL 3 03/16/2012 1029   VLDL 6.0 03/16/2012 1029   LDLCALC 98 03/16/2012 1029    Lab Results  Component Value Date   AST 17 08/11/2016   Lab Results  Component Value Date   ALT 19 08/11/2016   No components found for: ALKALINEPHOPHATASE No components found for: BILIRUBINTOTAL  No results found for: ESTRADIOL  Urinalysis Patient could not provide specimen at today's visit.   I have reviewed the labs.   Pertinent Imaging: Results for ALEKS, NAWROT (MRN [id number]) as of 03/07/2018 16:08  Ref. Range 03/07/2018 15:06  Scan Result Unknown 35 ml     Assessment  & Plan:    1. Urinary frequency May be the result of the combination of his Parkinson's and constipation - Urinalysis, Complete pending - CULTURE, URINE COMPREHENSIVE pending - BLADDER SCAN AMB NON-IMAGING  2.  Constipation Explained to the patient that the constipation may be the cause of his frequent urination as there is a large amount of stool in his rectal vault Advised him to perform a fleets enema tonight and to start MiraLAX daily     Return for pending UA and urine culture resutls .  These notes generated with voice recognition software. I apologize for typographical errors.  Zara Council, PA-C  [facility] [address]  [place], [place] [address] [phone]

## 2018-03-07 NOTE — Telephone Encounter (Signed)
Pt was added to nurse schedule with UTI symptoms, urgency and frequency.

## 2018-03-08 ENCOUNTER — Ambulatory Visit (INDEPENDENT_AMBULATORY_CARE_PROVIDER_SITE_OTHER): Payer: Medicare Other | Admitting: Neurology

## 2018-03-08 ENCOUNTER — Encounter: Payer: Self-pay | Admitting: Neurology

## 2018-03-08 ENCOUNTER — Encounter

## 2018-03-08 ENCOUNTER — Telehealth: Payer: Self-pay | Admitting: Urology

## 2018-03-08 VITALS — BP 130/60 | HR 70

## 2018-03-08 DIAGNOSIS — R413 Other amnesia: Secondary | ICD-10-CM | POA: Diagnosis not present

## 2018-03-08 DIAGNOSIS — G20 Parkinson's disease: Secondary | ICD-10-CM

## 2018-03-08 DIAGNOSIS — G249 Dystonia, unspecified: Secondary | ICD-10-CM

## 2018-03-08 NOTE — Telephone Encounter (Signed)
Please contact Casey Tucker and ask if he is able to provide an urine for UA and culture.

## 2018-03-08 NOTE — Patient Instructions (Addendum)
Continue carbidopa/levodopa 25/100, 2 tablets at 7:30 AM/1 tablet at 10 AM/1 tablet at 1:00 PM/1 tablet at 4 PM and 1 tablet at 7 PM.  Take entacapone with each of these dosages  Add carbidopa/levodopa 50/200 at bedtime.  I don't want you to drive unless you pass a driving evaluation. If you are interested in the driving assessment, you can contact [employer] in [place]. [phone].  Look into alternative living situations if you cannot live alone.

## 2018-03-08 NOTE — Procedures (Signed)
DBS Programming was performed.    Total time spent reviewing programming was 9 minutes.  Device was confirmed to be on.  Soft start was confirmed to be on.  Impedences were checked and were within normal limits.   All impedances were well within normal limits and are recorded on a separate worksheet.  Battery was checked and was determined to be functioning normally (2.95).  Final settings were as follows:  Left brain electrode, Group A:     2-C+; Amplitude  3.7   V   ; Pulse width 60 microseconds;   Frequency   135   Hz.  Left brain electrode, group B:  1-C+, amplitude: 3.2 V, PW: 90, frequency 140  Group A was active at the time pt left the office.  Right brain electrode:     n/a

## 2018-03-09 ENCOUNTER — Telehealth: Payer: Self-pay | Admitting: Neurology

## 2018-03-09 NOTE — Telephone Encounter (Signed)
Patient would like to talk to someone about home health and also needs  the bathroom recipe that you put in applesauce

## 2018-03-09 NOTE — Telephone Encounter (Signed)
Tried to call back with no answer  

## 2018-03-10 NOTE — Telephone Encounter (Signed)
Rancho recipe mailed to patient. I will await call back about home health questions.

## 2018-03-10 NOTE — Telephone Encounter (Signed)
Left message for pt to call office

## 2018-03-11 NOTE — Telephone Encounter (Signed)
Pt wife informed, she will have pt come in

## 2018-03-14 ENCOUNTER — Other Ambulatory Visit: Payer: Self-pay

## 2018-03-14 ENCOUNTER — Other Ambulatory Visit: Payer: Medicare Other

## 2018-03-14 DIAGNOSIS — R350 Frequency of micturition: Secondary | ICD-10-CM

## 2018-03-14 LAB — URINALYSIS, COMPLETE
BILIRUBIN UA: NEGATIVE
Glucose, UA: NEGATIVE
Leukocytes, UA: NEGATIVE
NITRITE UA: NEGATIVE
RBC UA: NEGATIVE
Specific Gravity, UA: 1.025 (ref 1.005–1.030)
UUROB: 0.2 mg/dL (ref 0.2–1.0)
pH, UA: 6 (ref 5.0–7.5)

## 2018-03-14 LAB — MICROSCOPIC EXAMINATION: Epithelial Cells (non renal): NONE SEEN /hpf (ref 0–10)

## 2018-03-15 ENCOUNTER — Other Ambulatory Visit: Payer: Self-pay | Admitting: Neurology

## 2018-03-15 ENCOUNTER — Telehealth: Payer: Self-pay | Admitting: Urology

## 2018-03-15 MED ORDER — ENTACAPONE 200 MG PO TABS: 200.0000 mg | ORAL_TABLET | Freq: Every day | ORAL | 1 refills | Status: DC

## 2018-03-15 NOTE — Telephone Encounter (Signed)
Pt called office asking for his urine results. Please advise pt. Thanks.

## 2018-03-16 NOTE — Telephone Encounter (Signed)
Pt calling asking for results of his urine. Please advise. Thanks.

## 2018-03-17 ENCOUNTER — Encounter: Payer: Medicare Other | Admitting: Psychology

## 2018-03-17 NOTE — Telephone Encounter (Signed)
Casey Tucker called pt informed him that he would need to bring another urine sample in order to send for cx.

## 2018-03-17 NOTE — Telephone Encounter (Signed)
Patient notified on vmail that urine was clear no sign of infection so culture was not sent . If still having symptoms he may return to the office to leave another specimen for culture

## 2018-03-23 DIAGNOSIS — R682 Dry mouth, unspecified: Secondary | ICD-10-CM | POA: Diagnosis not present

## 2018-03-23 DIAGNOSIS — R05 Cough: Secondary | ICD-10-CM | POA: Diagnosis not present

## 2018-03-28 ENCOUNTER — Ambulatory Visit: Payer: Medicare Other | Admitting: Occupational Therapy

## 2018-03-29 DIAGNOSIS — Z23 Encounter for immunization: Secondary | ICD-10-CM | POA: Diagnosis not present

## 2018-04-04 ENCOUNTER — Ambulatory Visit: Payer: Medicare Other | Admitting: Occupational Therapy

## 2018-04-05 ENCOUNTER — Ambulatory Visit: Payer: Medicare Other | Admitting: Occupational Therapy

## 2018-04-06 ENCOUNTER — Ambulatory Visit: Payer: Medicare Other | Admitting: Occupational Therapy

## 2018-04-07 ENCOUNTER — Encounter: Payer: Medicare Other | Admitting: Psychology

## 2018-04-07 ENCOUNTER — Ambulatory Visit: Payer: Medicare Other | Admitting: Occupational Therapy

## 2018-04-11 ENCOUNTER — Ambulatory Visit: Payer: Medicare Other | Admitting: Occupational Therapy

## 2018-04-12 ENCOUNTER — Ambulatory Visit: Payer: Medicare Other | Admitting: Occupational Therapy

## 2018-04-13 ENCOUNTER — Ambulatory Visit: Payer: Medicare Other | Admitting: Occupational Therapy

## 2018-04-13 ENCOUNTER — Other Ambulatory Visit: Payer: Self-pay | Admitting: Neurology

## 2018-04-13 MED ORDER — CYCLOBENZAPRINE HCL 10 MG PO TABS: 20.0000 mg | ORAL_TABLET | Freq: Every day | ORAL | 5 refills | Status: DC

## 2018-04-15 ENCOUNTER — Encounter: Payer: Medicare Other | Admitting: Occupational Therapy

## 2018-04-18 ENCOUNTER — Ambulatory Visit: Payer: Medicare Other | Admitting: Occupational Therapy

## 2018-04-19 ENCOUNTER — Ambulatory Visit: Payer: Medicare Other | Admitting: Occupational Therapy

## 2018-04-20 ENCOUNTER — Ambulatory Visit: Payer: Medicare Other | Admitting: Occupational Therapy

## 2018-04-20 ENCOUNTER — Other Ambulatory Visit: Payer: Self-pay | Admitting: Internal Medicine

## 2018-04-20 NOTE — Telephone Encounter (Signed)
Patient stated he is completely out of the medication and he needs it asap.

## 2018-04-20 NOTE — Telephone Encounter (Signed)
Name of Medication: Clonazepam 0.5 mg Name of Pharmacy: [facility] or Written Date and Quantity: # 60 on 03/03/18 Last Office Visit and Type: 05/11/17 annual Next Office Visit and Type:none scheduled

## 2018-04-21 ENCOUNTER — Ambulatory Visit: Payer: Medicare Other | Admitting: Occupational Therapy

## 2018-04-25 ENCOUNTER — Ambulatory Visit: Payer: Medicare Other | Admitting: Occupational Therapy

## 2018-04-26 ENCOUNTER — Ambulatory Visit: Payer: Medicare Other | Admitting: Occupational Therapy

## 2018-04-27 ENCOUNTER — Ambulatory Visit: Payer: Medicare Other | Admitting: Occupational Therapy

## 2018-04-28 ENCOUNTER — Ambulatory Visit: Payer: Medicare Other | Admitting: Occupational Therapy

## 2018-05-02 ENCOUNTER — Encounter: Payer: Medicare Other | Admitting: Speech Pathology

## 2018-05-02 ENCOUNTER — Telehealth: Payer: Self-pay | Admitting: Neurology

## 2018-05-02 NOTE — Telephone Encounter (Signed)
Patient would like to speak with you regarding him having a 2nd DBS surgery. Please Call. Thanks

## 2018-05-03 ENCOUNTER — Encounter: Payer: Medicare Other | Admitting: Speech Pathology

## 2018-05-03 NOTE — Telephone Encounter (Signed)
Spoke with patient and made him aware per Dr. Doristine Devoid last note that she would not consider DBS without neurocognitive testing. He had cancelled his last appt with Dr. Si Raider. He does want to reschedule and he was transferred to our front desk to reschedule this testing.

## 2018-05-03 NOTE — Telephone Encounter (Signed)
Left message on machine for patient to call back.

## 2018-05-04 ENCOUNTER — Encounter: Payer: Medicare Other | Admitting: Speech Pathology

## 2018-05-05 ENCOUNTER — Encounter: Payer: Medicare Other | Admitting: Speech Pathology

## 2018-05-09 ENCOUNTER — Encounter: Payer: Medicare Other | Admitting: Occupational Therapy

## 2018-05-09 ENCOUNTER — Encounter: Payer: Medicare Other | Admitting: Speech Pathology

## 2018-05-10 ENCOUNTER — Encounter: Payer: Medicare Other | Admitting: Speech Pathology

## 2018-05-10 ENCOUNTER — Encounter: Payer: Medicare Other | Admitting: Occupational Therapy

## 2018-05-11 ENCOUNTER — Encounter: Payer: Medicare Other | Admitting: Speech Pathology

## 2018-05-12 ENCOUNTER — Encounter: Payer: Medicare Other | Admitting: Speech Pathology

## 2018-05-16 ENCOUNTER — Encounter: Payer: Medicare Other | Admitting: Speech Pathology

## 2018-05-17 ENCOUNTER — Encounter: Payer: Medicare Other | Admitting: Speech Pathology

## 2018-05-18 ENCOUNTER — Encounter: Payer: Medicare Other | Admitting: Speech Pathology

## 2018-05-18 DIAGNOSIS — G20 Parkinson's disease: Secondary | ICD-10-CM | POA: Diagnosis not present

## 2018-05-19 ENCOUNTER — Encounter: Payer: Medicare Other | Admitting: Speech Pathology

## 2018-05-23 ENCOUNTER — Encounter: Payer: Medicare Other | Admitting: Speech Pathology

## 2018-05-24 ENCOUNTER — Telehealth: Payer: Self-pay | Admitting: Neurology

## 2018-05-24 ENCOUNTER — Encounter: Payer: Medicare Other | Admitting: Speech Pathology

## 2018-05-24 MED ORDER — CARBIDOPA-LEVODOPA 25-100 MG PO TABS: ORAL_TABLET | ORAL | 1 refills | Status: DC

## 2018-05-24 NOTE — Telephone Encounter (Signed)
RX sent to pharmacy  

## 2018-05-24 NOTE — Telephone Encounter (Signed)
Patient needs the Carbidopa-levodopa refill to the [facility] in [place] please! Thanks!

## 2018-05-25 ENCOUNTER — Other Ambulatory Visit: Payer: Self-pay | Admitting: Internal Medicine

## 2018-05-25 ENCOUNTER — Encounter: Payer: Medicare Other | Admitting: Speech Pathology

## 2018-05-25 NOTE — Telephone Encounter (Signed)
Last filled 04-20-18 Last OV Acute 01-21-18 Next OV 06-30-18

## 2018-05-26 ENCOUNTER — Encounter: Payer: Medicare Other | Admitting: Speech Pathology

## 2018-05-26 ENCOUNTER — Other Ambulatory Visit: Payer: Self-pay | Admitting: Neurology

## 2018-05-26 MED ORDER — AMANTADINE HCL 100 MG PO CAPS: ORAL_CAPSULE | ORAL | 5 refills | Status: DC

## 2018-05-27 ENCOUNTER — Telehealth: Payer: Self-pay | Admitting: Neurology

## 2018-05-27 ENCOUNTER — Encounter: Payer: Medicare Other | Admitting: Speech Pathology

## 2018-05-27 NOTE — Telephone Encounter (Signed)
Patient is calling wanting to speak with the nurse. Please call him back at [phone]. I couldn't understand the reason why. Im sorry. Thanks!

## 2018-05-27 NOTE — Telephone Encounter (Signed)
Patient asking about Nourianz and I made him aware that this is a new drug to the market that can be discussed whether it is appropriate for him at his next visit.

## 2018-06-01 ENCOUNTER — Telehealth: Payer: Self-pay | Admitting: Neurology

## 2018-06-01 NOTE — Telephone Encounter (Signed)
Dr Bailar is leaving our office to take a job closer to where she lives. We have mailed a letter to patient to let her know we have canceled all appointments with Bailar. Thank you for your understanding °

## 2018-06-24 ENCOUNTER — Encounter: Payer: Self-pay | Admitting: Internal Medicine

## 2018-06-24 ENCOUNTER — Telehealth: Payer: Self-pay

## 2018-06-24 ENCOUNTER — Ambulatory Visit (INDEPENDENT_AMBULATORY_CARE_PROVIDER_SITE_OTHER): Payer: Medicare Other | Admitting: Internal Medicine

## 2018-06-24 ENCOUNTER — Ambulatory Visit (INDEPENDENT_AMBULATORY_CARE_PROVIDER_SITE_OTHER)
Admission: RE | Admit: 2018-06-24 | Discharge: 2018-06-24 | Disposition: A | Discharge status: Home / Self Care | Payer: Medicare Other | Source: Ambulatory Visit | Attending: Internal Medicine | Admitting: Internal Medicine

## 2018-06-24 VITALS — BP 122/64 | HR 71 | Temp 98.3°F | Resp 24 | Ht 70.0 in | Wt 154.0 lb

## 2018-06-24 DIAGNOSIS — R05 Cough: Secondary | ICD-10-CM | POA: Diagnosis not present

## 2018-06-24 DIAGNOSIS — R059 Cough, unspecified: Secondary | ICD-10-CM

## 2018-06-24 DIAGNOSIS — K219 Gastro-esophageal reflux disease without esophagitis: Secondary | ICD-10-CM

## 2018-06-24 DIAGNOSIS — J181 Lobar pneumonia, unspecified organism: Secondary | ICD-10-CM | POA: Diagnosis not present

## 2018-06-24 LAB — CBC
HCT: 27.3 % — ABNORMAL LOW (ref 39.0–52.0)
Hemoglobin: 9.6 g/dL — ABNORMAL LOW (ref 13.0–17.0)
MCHC: 35.1 g/dL (ref 30.0–36.0)
MCV: 116.8 fl — ABNORMAL HIGH (ref 78.0–100.0)
Platelets: 269 10*3/uL (ref 150.0–400.0)
RBC: 2.34 Mil/uL — ABNORMAL LOW (ref 4.22–5.81)
RDW: 13.8 % (ref 11.5–15.5)
WBC: 5 10*3/uL (ref 4.0–10.5)

## 2018-06-24 LAB — COMPREHENSIVE METABOLIC PANEL
ALT: 2 U/L (ref 0–53)
AST: 11 U/L (ref 0–37)
Albumin: 4.1 g/dL (ref 3.5–5.2)
Alkaline Phosphatase: 59 U/L (ref 39–117)
BILIRUBIN TOTAL: 1.5 mg/dL — AB (ref 0.2–1.2)
BUN: 21 mg/dL (ref 6–23)
CO2: 27 meq/L (ref 19–32)
Calcium: 9 mg/dL (ref 8.4–10.5)
Chloride: 102 mEq/L (ref 96–112)
Creatinine, Ser: 0.87 mg/dL (ref 0.40–1.50)
GFR: 90.51 mL/min (ref >60.00)
Glucose, Bld: 105 mg/dL — ABNORMAL HIGH (ref 70–99)
Potassium: 4 mEq/L (ref 3.5–5.1)
Sodium: 137 mEq/L (ref 135–145)
Total Protein: 6.9 g/dL (ref 6.0–8.3)

## 2018-06-24 MED ORDER — AZITHROMYCIN 250 MG PO TABS: ORAL_TABLET | ORAL | 0 refills | Status: DC

## 2018-06-24 NOTE — Telephone Encounter (Signed)
Dr Silvio Pate called me and said to tell pt radiologist saw a touch of pneumonia. Dr Silvio Pate said to call in Z pak and have pt cb on 06/27/18 with update how he is feeling. Pt voiced understanding and wants [facility] and pt will cb on 06/27/18 with update. FYI to Dr Silvio Pate. (I spoke with Maudie Mercury at [facility] to make sure Z pak rx was received. Maudie Mercury said they did get the Zpak rx and will get ready for pick up).

## 2018-06-24 NOTE — Progress Notes (Signed)
Subjective:    Patient ID: Casey Tucker, male    DOB: 1941-05-15, 77 y.o.   MRN: [id number]  HPI Here due to respiratory illness  Went to party at Thanksgiving Noticed illness after that---sore throat x 1 day Then sick for past 3 days Tremendous amount of phlegm Coughing up at night--up from chest No fever No chills or sweats Not SOB  Some sinus drainage No sig headache or pressure No ear pain  No treatment at all  Current Outpatient Medications on File Prior to Visit  Medication Sig Dispense Refill  . amantadine (SYMMETREL) 100 MG capsule TAKE ONE CAPSULE THREE TIMES A DAY 90 capsule 5  . carbidopa-levodopa (SINEMET IR) 25-100 MG tablet 2 at 7:30am, 1 at 10am, 1 at 1pm, 1 at 4pm, 1 at 7pm 540 tablet 1  . clonazePAM (KLONOPIN) 0.5 MG tablet TAKE 1-2 TABLETS BY MOUTH AT BEDTIME AS NEEDED 60 tablet 0  . cyclobenzaprine (FLEXERIL) 10 MG tablet Take 2 tablets (20 mg total) by mouth at bedtime. 60 tablet 5  . entacapone (COMTAN) 200 MG tablet Take 1 tablet (200 mg total) by mouth 5 (five) times daily. Take with Levodopa 450 tablet 1  . finasteride (PROSCAR) 5 MG tablet Take 1 tablet (5 mg total) by mouth daily. 30 tablet 11   No current facility-administered medications on file prior to visit.     Allergies  Allergen Reactions  . Amoxicillin   . Celebrex [Celecoxib]   . Demerol Other (See Comments)    Hallucinations   . Penicillins     REACTION: RASH    Past Medical History:  Diagnosis Date  . Arthritis    left hip   . GERD (gastroesophageal reflux disease) Pre 2002   Mild  . History of kidney stones   . 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 Parkinson disease ([facility]) 2004   Slowly progressive  . Ruptured appendix teens    Past Surgical History:  Procedure Laterality Date  . APPENDECTOMY    . CATARACT EXTRACTION W/ INTRAOCULAR LENS  IMPLANT, BILATERAL  2017  . DEEP BRAIN  STIMULATOR PLACEMENT  8/14   L STN  . DOPPLER ECHOCARDIOGRAPHY  01/10/2009   LV NML Mild LVH EF 60-65% aortic sclerosis w/0 stenosis   . JOINT REPLACEMENT     left hip replacement 08/14/11/[place]   . Neuro evaluation  12/19/2004   [name], benign tremor  . SUBTHALAMIC STIMULATOR BATTERY REPLACEMENT Left 03/25/2017   Procedure: Deep Brain Stimulator battery replacement;  Surgeon: Erline Levine, MD;  Location: [facility];  Service: Neurosurgery;  Laterality: Left;  left  . TONSILLECTOMY    . TOTAL HIP REVISION  08/27/2011   Procedure: TOTAL HIP REVISION;  Surgeon: Mauri Pole, MD;  Location[facility];  Service: Orthopedics;  Laterality: Left;    Family History  Problem Relation Age of Onset  . Arthritis Mother        knee replacement  . COPD Father        emphysema, smoker  . Heart disease Father        CHF  . Alcohol abuse Paternal Uncle     Social History   Socioeconomic History  . Marital status: Married    Spouse name: Not on file  . Number of children: 2  . Years of education: Not on file  . Highest education level: Not on file  Occupational History  . 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 Financial resource strain: Not on file  . Food insecurity:    Worry: Not on file    Inability: Not on file  . Transportation needs:    Medical: Not on file    Non-medical: Not on file  Tobacco Use  . Smoking status: Never Smoker  . Smokeless tobacco: Never Used  Substance and Sexual Activity  . Alcohol use: Yes    Alcohol/week: 7.0 standard drinks    Types: 7 Standard drinks or equivalent per week    Comment: occassionally  . Drug use: No  . Sexual activity: Yes  Lifestyle  . Physical activity:    Days per week: Not on file    Minutes per session: Not on file  . Stress: Not on file  Relationships  . Social connections:    Talks on phone: Not on file    Gets together: Not on file    Attends religious service: Not on file    Active member  of club or organization: Not on file    Attends meetings of clubs or organizations: Not on file    Relationship status: Not on file  . Intimate partner violence:    Fear of current or ex partner: Not on file    Emotionally abused: Not on file    Physically abused: Not on file    Forced sexual activity: Not on file  Other Topics Concern  . Not on file  Social History Narrative   Married, lives with wife   2 daughters      Has living will   Wife has health care POA---then daughters   Would still accept CPR--but no prolonged artificial means (ventilator or tube feeds)   Review of Systems No vomiting or diarrhea Appetite is fine    Objective:   Physical Exam  Constitutional: No distress.  tachypneic but no distress  HENT:  Mouth/Throat: Oropharynx is clear and moist. No oropharyngeal exudate.  No sinus tenderness Mild nasal congestion TMs normal  Neck: No thyromegaly present.  Respiratory: Effort normal. No respiratory distress. He has no wheezes. He has no rales.  Decreased breath sounds at bases  Lymphadenopathy:    He has no cervical adenopathy.           Assessment & Plan:

## 2018-06-24 NOTE — Assessment & Plan Note (Addendum)
Likely viral and sinus but concerned about possible pneumonia Will check CXR---shows chronic interstitial changes that were seen in 2013 also Discussed likely viral etiology Symptomatic care Discussed red flag symptoms--fever, increased SOB, etc

## 2018-06-24 NOTE — Assessment & Plan Note (Signed)
Okay Will check labs

## 2018-06-24 NOTE — Telephone Encounter (Signed)
Pt was seen earlier today and request cb when gets xray results. Larene Beach CMA said to send note to Dr Silvio Pate (rt middle lobe pneumonia; did not mention to pt).

## 2018-06-25 DIAGNOSIS — J181 Lobar pneumonia, unspecified organism: Secondary | ICD-10-CM | POA: Insufficient documentation

## 2018-06-25 NOTE — Assessment & Plan Note (Signed)
Radiologist called [facility] infiltrate rx for z-pak Will set up follow up CXR

## 2018-06-25 NOTE — Telephone Encounter (Signed)
Please check on him on Monday 

## 2018-06-25 NOTE — Addendum Note (Signed)
Addended by: [name] on: 06/25/2018 02:21 PM   Modules accepted: Orders

## 2018-06-27 ENCOUNTER — Telehealth: Payer: Self-pay | Admitting: Internal Medicine

## 2018-06-27 ENCOUNTER — Telehealth: Payer: Self-pay | Admitting: Neurology

## 2018-06-27 NOTE — Telephone Encounter (Signed)
Okay, great Please set up the follow up CXR in about a month and have him call if he doesn't completely feel better by the end of the week.

## 2018-06-27 NOTE — Telephone Encounter (Signed)
Spoke with patient and he wants to discuss starting Nourianz. I told him this really needs to be discussed at his next office visit (in January). He really wants to start prior to the holidays. He is having a lot of off time.  I let him know I would pass this along to Dr. Carles Collet - Please advise.

## 2018-06-27 NOTE — Telephone Encounter (Signed)
See new TE

## 2018-06-27 NOTE — Telephone Encounter (Signed)
Patient is calling in with questions about DBS? It was hard to understand him. Please call him back at [phone]. Thanks!

## 2018-06-27 NOTE — Telephone Encounter (Signed)
Pt called to report to [facility] how he has been feeling since having pneumonia. Pt states he is still congested. Pt just hung up the phone without me being able to ask anything.

## 2018-06-27 NOTE — Telephone Encounter (Signed)
Spoke to pt. He said he is feeling about 75% better except a cough that seems to be getting better, also.

## 2018-06-28 NOTE — Telephone Encounter (Signed)
Will discuss at next visit, as you told him.

## 2018-06-28 NOTE — Telephone Encounter (Signed)
Patient made aware.

## 2018-06-30 ENCOUNTER — Telehealth: Payer: Self-pay | Admitting: Neurology

## 2018-06-30 ENCOUNTER — Encounter: Payer: Self-pay | Admitting: Internal Medicine

## 2018-06-30 ENCOUNTER — Ambulatory Visit (INDEPENDENT_AMBULATORY_CARE_PROVIDER_SITE_OTHER): Payer: Medicare Other | Admitting: Internal Medicine

## 2018-06-30 VITALS — BP 118/64 | HR 64 | Temp 97.5°F | Ht 70.5 in | Wt 157.0 lb

## 2018-06-30 DIAGNOSIS — Z7189 Other specified counseling: Secondary | ICD-10-CM | POA: Diagnosis not present

## 2018-06-30 DIAGNOSIS — G20 Parkinson's disease: Secondary | ICD-10-CM | POA: Diagnosis not present

## 2018-06-30 DIAGNOSIS — I350 Nonrheumatic aortic (valve) stenosis: Secondary | ICD-10-CM | POA: Diagnosis not present

## 2018-06-30 DIAGNOSIS — Z0000 Encounter for general adult medical examination without abnormal findings: Secondary | ICD-10-CM

## 2018-06-30 DIAGNOSIS — Z1211 Encounter for screening for malignant neoplasm of colon: Secondary | ICD-10-CM | POA: Diagnosis not present

## 2018-06-30 DIAGNOSIS — J181 Lobar pneumonia, unspecified organism: Secondary | ICD-10-CM | POA: Diagnosis not present

## 2018-06-30 MED ORDER — CLONAZEPAM 0.5 MG PO TABS: 0.5000 mg | ORAL_TABLET | Freq: Every evening | ORAL | 0 refills | Status: DC | PRN

## 2018-06-30 NOTE — Telephone Encounter (Signed)
Spoke with wife regarding letter that was mailed (Dr. Si Raider leaving). Wife stated that husband has an appt with Dr. Carles Collet in January and will discuss further whether to pursue a referral to another neuropsychologist for cognitive testing. Wife stated that husband goes back and forth on doing the DBS.

## 2018-06-30 NOTE — Assessment & Plan Note (Signed)
Has improved Will check CXR again in a month

## 2018-06-30 NOTE — Progress Notes (Signed)
Hearing Screening Comments: Saw Dr Thornell Mule last year and needs hearing aids but has not purchased them. Vision Screening Comments: December 2018. Has appt later this month.

## 2018-06-30 NOTE — Assessment & Plan Note (Signed)
Had maintained stable function on the DBS Frustrated about the driving Considering other side DBS Sees Dr Tat

## 2018-06-30 NOTE — Progress Notes (Signed)
Subjective:    Patient ID: Casey Tucker, male    DOB: 14-Aug-1940, 77 y.o.   MRN: [id number]  HPI Here for Medicare wellness visit and follow up of chronic health conditions Reviewed form and advanced directives Reviewed other doctors Occasional drink of alcohol No tobacco Tries to exercise regularly---based on ability with the Parkinson's Multiple falls---"I have learned to fall". No injuries No depression or anhedonia Vision is fine Rx for hearing aides--hasn't filled it He feels his memory is okay  Feels better from the pneumonia Still some cough and drainage (post nasal) Breathing is back to normal again  Parkinson's is about the same DBS working Going for cognitive testing at [facility]--and considering DBS on the other side as well Hasn't been driving--hopes maybe he can drive short distances Does all ADLs and helps with housework  No chest pain No palpitations No dizziness or syncope No edema  Current Outpatient Medications on File Prior to Visit  Medication Sig Dispense Refill  . amantadine (SYMMETREL) 100 MG capsule TAKE ONE CAPSULE THREE TIMES A DAY 90 capsule 5  . carbidopa-levodopa (SINEMET IR) 25-100 MG tablet 2 at 7:30am, 1 at 10am, 1 at 1pm, 1 at 4pm, 1 at 7pm 540 tablet 1  . clonazePAM (KLONOPIN) 0.5 MG tablet TAKE 1-2 TABLETS BY MOUTH AT BEDTIME AS NEEDED 60 tablet 0  . cyclobenzaprine (FLEXERIL) 10 MG tablet Take 2 tablets (20 mg total) by mouth at bedtime. 60 tablet 5  . entacapone (COMTAN) 200 MG tablet Take 1 tablet (200 mg total) by mouth 5 (five) times daily. Take with Levodopa 450 tablet 1  . finasteride (PROSCAR) 5 MG tablet Take 1 tablet (5 mg total) by mouth daily. 30 tablet 11   No current facility-administered medications on file prior to visit.     Allergies  Allergen Reactions  . Amoxicillin   . Celebrex [Celecoxib]   . Demerol Other (See Comments)    Hallucinations   . Penicillins     REACTION: RASH    Past Medical History:    Diagnosis Date  . Arthritis    left hip   . GERD (gastroesophageal reflux disease) Pre 2002   Mild  . History of kidney stones   . 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 Parkinson disease ([place]) 2004   Slowly progressive  . Ruptured appendix teens    Past Surgical History:  Procedure Laterality Date  . APPENDECTOMY    . CATARACT EXTRACTION W/ INTRAOCULAR LENS  IMPLANT, BILATERAL  2017  . DEEP BRAIN STIMULATOR PLACEMENT  8/14   L STN  . DOPPLER ECHOCARDIOGRAPHY  01/10/2009   LV NML Mild LVH EF 60-65% aortic sclerosis w/0 stenosis   . JOINT REPLACEMENT     left hip replacement 08/14/11/[place]   . Neuro evaluation  12/19/2004   [name], benign tremor  . SUBTHALAMIC STIMULATOR BATTERY REPLACEMENT Left 03/25/2017   Procedure: Deep Brain Stimulator battery replacement;  Surgeon: Erline Levine, MD;  Location: [place];  Service: Neurosurgery;  Laterality: Left;  left  . TONSILLECTOMY    . TOTAL HIP REVISION  08/27/2011   Procedure: TOTAL HIP REVISION;  Surgeon: Mauri Pole, MD;  Location: [facility];  Service: Orthopedics;  Laterality: Left;    Family History  Problem Relation Age of Onset  . Arthritis Mother        knee replacement  . COPD Father  emphysema, smoker  . Heart disease Father        CHF  . Alcohol abuse Paternal Uncle     Social History   Socioeconomic History  . Marital status: Married    Spouse name: Not on file  . Number of children: 2  . Years of education: Not on file  . Highest education level: Not on file  Occupational History  . 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 Financial resource strain: Not on file  . Food insecurity:    Worry: Not on file    Inability: Not on file  . Transportation needs:    Medical: Not on file    Non-medical: Not on file  Tobacco Use  . Smoking status: Never Smoker  . Smokeless tobacco: Never Used   Substance and Sexual Activity  . Alcohol use: Yes    Alcohol/week: 7.0 standard drinks    Types: 7 Standard drinks or equivalent per week    Comment: occassionally  . Drug use: No  . Sexual activity: Yes  Lifestyle  . Physical activity:    Days per week: Not on file    Minutes per session: Not on file  . Stress: Not on file  Relationships  . Social connections:    Talks on phone: Not on file    Gets together: Not on file    Attends religious service: Not on file    Active member of club or organization: Not on file    Attends meetings of clubs or organizations: Not on file    Relationship status: Not on file  . Intimate partner violence:    Fear of current or ex partner: Not on file    Emotionally abused: Not on file    Physically abused: Not on file    Forced sexual activity: Not on file  Other Topics Concern  . Not on file  Social History Narrative   Married, lives with wife   2 daughters      Has living will   Wife has health care POA---then daughters   Would still accept CPR--but no prolonged artificial means (ventilator or tube feeds)   Review of Systems No headaches Appetite is fine Weight is down slightly---his choice Wears seat belt Sleeps well with the clonazepam Dry mouth from his meds---keeps up with meds No problems with skin--sees dermatologist Constipated at times--- discussed miralax Voids fine--empties fine. No heartburn or dysphagia    Objective:   Physical Exam  Constitutional: He is oriented to person, place, and time. No distress.  HENT:  Mouth/Throat: Oropharynx is clear and moist. No oropharyngeal exudate.  Neck: No thyromegaly present.  Cardiovascular: Normal rate, regular rhythm and intact distal pulses. Exam reveals no gallop.  Rare skips Gr 2/6 aortic systolic murmur  Respiratory: Effort normal and breath sounds normal. No respiratory distress. He has no wheezes. He has no rales.  GI: Soft. There is no abdominal tenderness.   Musculoskeletal:        General: No tenderness or edema.  Lymphadenopathy:    He has no cervical adenopathy.  Neurological: He is alert and oriented to person, place, and time.  President--- "Dwaine Deter, Bush" [phone] [id number] Recall 2/3  Mild bradykinesia  Skin: No rash noted. No erythema.  Psychiatric: He has a normal mood and affect. His behavior is normal.           Assessment & Plan:

## 2018-06-30 NOTE — Assessment & Plan Note (Signed)
See social history 

## 2018-06-30 NOTE — Addendum Note (Signed)
Addended by: [name] on: 06/30/2018 03:31 PM   Modules accepted: Orders

## 2018-06-30 NOTE — Assessment & Plan Note (Signed)
I have personally reviewed the Medicare Annual Wellness questionnaire and have noted 1. The patient's medical and social history 2. Their use of alcohol, tobacco or illicit drugs 3. Their current medications and supplements 4. The patient's functional ability including ADL's, fall risks, home safety risks and hearing or visual             impairment. 5. Diet and physical activities 6. Evidence for depression or mood disorders  The patients weight, height, BMI and visual acuity have been recorded in the chart I have made referrals, counseling and provided education to the patient based review of the above and I have provided the pt with a written personalized care plan for preventive services.  I have provided you with a copy of your personalized plan for preventive services. Please take the time to review along with your updated medication list.  Had flu vaccine No PSA due to age FIT Tries to stay fit

## 2018-06-30 NOTE — Assessment & Plan Note (Signed)
No symptoms Observe only

## 2018-07-11 ENCOUNTER — Other Ambulatory Visit (INDEPENDENT_AMBULATORY_CARE_PROVIDER_SITE_OTHER): Payer: Medicare Other

## 2018-07-11 DIAGNOSIS — Z1211 Encounter for screening for malignant neoplasm of colon: Secondary | ICD-10-CM

## 2018-07-11 LAB — FECAL OCCULT BLOOD, IMMUNOCHEMICAL: Fecal Occult Bld: NEGATIVE

## 2018-07-15 ENCOUNTER — Telehealth: Payer: Self-pay | Admitting: *Deleted

## 2018-07-15 NOTE — Telephone Encounter (Signed)
Spoke to pt who states his parkinsons med causes him to have constipation. He uses both a stool softener and dulcolax with no relief. Pt believes his "brain is not getting the message" for him to go to the bathroom and he sometimes goes 4days without a BM. He is wanting to know if Dr Silvio Pate has any suggestions, either OTC or Rx to provide some relief. pls advise

## 2018-07-15 NOTE — Telephone Encounter (Signed)
I would recommend senna s 2-4 tablets daily along with miralax 1 capful in full glass of water daily (and can be increased to twice a day)

## 2018-07-15 NOTE — Telephone Encounter (Signed)
Spoke to pt

## 2018-08-02 ENCOUNTER — Telehealth: Payer: Self-pay | Admitting: Internal Medicine

## 2018-08-02 ENCOUNTER — Ambulatory Visit (INDEPENDENT_AMBULATORY_CARE_PROVIDER_SITE_OTHER)
Admission: RE | Admit: 2018-08-02 | Discharge: 2018-08-02 | Disposition: A | Discharge status: Home / Self Care | Payer: Medicare Other | Source: Ambulatory Visit | Attending: Internal Medicine | Admitting: Internal Medicine

## 2018-08-02 DIAGNOSIS — J181 Lobar pneumonia, unspecified organism: Secondary | ICD-10-CM | POA: Diagnosis not present

## 2018-08-02 NOTE — Telephone Encounter (Signed)
Pt called back and wanted to know if he could come in today for CXR. Per CXR 06/24/18 pt should return in 1 month for CXR. Advised pt should at [facility] before $PM. Pt said he will come now to get CXR. FYI to Dr Silvio Pate and Ander Purpura.

## 2018-08-02 NOTE — Progress Notes (Signed)
`   Casey Tucker was seen today in the movement disorders clinic for neurologic consultation at the request of [name], MD.  The consultation is for the evaluation of PD.  The records that were made available to me were reviewed.  Pt is accompanied by his wife who supplements the history.   Pt reports first sx was in 2006.  Pt is R hand dominant.  Pt states that his next door neighbor (Network engineer) noted that he wasn't swinging his arm and thought that he had a stroke. He subsequently saw [name] in about 2006 and he was dx with PD.  He was started on mirapex and azilect.  It helped the overall stiffness.  Tremor developed in the R arm around the same time as the diagnosis.  Levodopa was started in approximately 2009-2010.  He also started an exercise program and he believes that has made a huge difference.  He began to have some motor fluctuations and saw [name] and had his surgery done in 02/2013.  It was a unilateral implant, placed into the L STN.  He has noted that he is sleeping much better since surgery.  No tremor.  [name] programmed the DBS one week post op.  He f/u with the NP at [place], and he now has an A and a B setting.  He hates the A setting (doesn't walk well) and he "cranked" his own B setting up to 280 (assuming frequency) and this helped walking some but he still has problems with balance and freezing.  He is on mirapex, 1 mg tid.  He is on carbidopa/levodopa 25/100, 7:30am/11am/2pm/6pm.    11/15/13:  Pt returns today for follow up.  Overall, he has been doing well.  He is off of medication this AM for programming.  His wife reports that he has done better on his new setting.  Seems to be walking better with less dyskinesia.  Until this morning, he was still on the carbidopa/levodopa 25/100, 1 tablet 4 times per day, Azilect 1 mg daily and Mirapex 1 mg 3 times per day.  He is exercising faithfully.  He has not had any falls.  He has been shuffling a little more this  morning, since he is off of medication.  11/21/13 update:  The patient is returning for followup, earlier than expected.  The patient reports that he really had not been doing as well over the weekend.  He did not feel like he was walking as well. He felt like the setting "wore" off 30 mins before the next levodopa dosage.  His wife wonders if he wasn't doing as well because he was at the beach this past weekend and there were more stairs to negotiate.  No falls.  No hallucinations   He did decrease his carbidopa/levodopa 25/100, one tablet 3 times per day from one tablet 4 times per day.  He is still on Mirapex 1 mg 3 times per day and Azilect 1 mg daily.  12/20/13 update:  The patient returns today for followup, accompanied by his wife and daughter who supplements the history.  The patient is currently on Mirapex, 1 mg 3 times a day, Azilect 1 mg daily and carbidopa/levodopa 25/100 at 7:30 AM/11 AM/2 PM and 6:30 PM.  Overall, the patient states that he is doing much better on his current setting.  It is not seeming to "wear off."  He is having some difficulty in the middle of the night when he  gets up and sometimes has to use a cane, but otherwise is doing well.  No falls.  On one occasion, he did try to get up in the morning and go and take a walk and was able to walk a few miles without taking his medication, but then he needed it when he got home.  He has noticed some dyskinesia, but it has not been particularly bothersome.  He would like to decrease dyskinesia, if able.  03/20/14 update:  Pt is f//u today.  Overall doing well.  He tried to decrease his carbidopa/levodopa 25/100 from one tablet 4 times per day to 1/0.5/0.5/1, but ended up going back up to one tablet 4 times per day.  He finds that he is due for his second dose at 11 AM, but by 10:30 AM he is shuffling, and he thinks it is primarily the right foot.  When he goes hiking, he states that he takes an extra half tablet at 10:30 AM and as well and  often takes another half tablet in the afternoon.  He finds that protein interferes with the levodopa dosing.  He continues to take 2 Flexeril at night.  He does not know if it helps, but has not tried to stop that since the DBS surgery.  He finds that the only time he has dyskinesia is at the end of the dose.  He asks me if we can slightly increase the DBS stimulation.  He continues to exercise.  No falls.  No hallucinations.  05/18/14 update: The patient is following up today, accompanied by his wife who supplements the history.  I tried to slightly decrease the levodopa, but the patient is was not able to do that.  He remains on carbidopa/levodopa 25/100, 1 tablet 4 times per day.  He is also on Mirapex 1 mg 3 times per day along with Azilect 1 mg daily. C/o some "slight" freezing episodes and med wearing off 1/2 hr before due but at the same time having frequent dyskinesia of the R leg.  No falls.  Staying very active.  No hallucinations.  C/o cost of azilect and asks about its benefit.    08/08/14 update:  Overall, the patient is doing well.  He has not taken medication since 6:30 this morning, and wanted me to see him off of medication.  He states that he has some "spotty freezing."  He asks about potentially taking metformin to help him.  He also asks about levodopa nasal spray.  He asks about several other experimental treatments.  He has occasional dyskinesia in the right foot but it is not bothersome to him.  Overall, he feels slightly slower than he used to.  He states that he generally is supposed to take his levodopa at 11 AM but he needs it by 10:30.  If he is very active he will need to take a few extra half dosages of levodopa throughout the day.  Generally, however, he takes 4 tablets of carbidopa/levodopa 25/100 throughout the day in addition to entacapone 200 mg twice a day and Mirapex 1 mg 3 times a day.  He is off of Azilect and wants to know if he should go back on it.  He noticed no  difference when discontinuing it.  He is off of Flexeril as well as noticed no trouble with that.  10/10/14 update:  Overall, the patient is doing well.  He is on carbidopa/levodopa 25/100 4 times per day.  He takes entacapone 200 mg with  the first and third dose of the levodopa.  He really does not notice that it helps and states that he still feels "jerky" 30 minutes before his next dosage is due.  He will often just take the levodopa and then.  He is on Mirapex 1 mg 3 times a day.  I added carbidopa/levodopa 50/200 CR at night because of cramping and the patient states that it helps but he rarely takes it because he thinks the dyskinesia that worse when he did it.  He states that he uses it if he really needs to "get up and go."  He does use Flexeril at night for cramping and thinks it helped as much as the carbidopa/levodopa 50/200 CR.  He did turn down the DBS device somewhat since last visit because of dyskinesia.  He states that he takes Azilect "off and on" because he was told by a previous neurologist that it was neuroprotective.  He asks me about isradapine.  02/12/15 update:  The patient returns today for follow-up.  I have communicated with him several times since last visit.  He called with complaints of freezing or before his next dose and we ended up slightly increasing his levodopa so that he is taking 1-1/2 tablets in the morning, one tablet at his next dose, 1-1/2 tablets in the mid afternoon and then 1 tablet in the later evening.  This helped but he states that sometimes he will take 1 in the AM and the 1/2 an hour or 2 later.  He still only takes 3-4 a day.  He takes entacapone with every other dose of the levodopa.  He is on pramipexole 1 mg 3 times per day.  Dyskinesia has been problematic despite the fact he complains about freezing and we ended up adding amantadine, 100 mg 3 times a day for that. It helped that but caused constipation and dry mouth.   He had 2 falls since last visit, one  of which was at the outer edge of my office when he came to bring me something.  He quickly turned around while carrying something and fell to his knee.  He did not get hurt.   He states that if he gets in a crowded space his feet will just get tangled together.   He does know that if he gets into "shuffle mode" he needs to stop and "reset."  He states overall shuffling is much better.  He asked me about several studies since our last visit, but these are not available to him because of the fact he has already had DBS.  He asks me again about nilotinib study.  05/15/15 update:  The patient is following up today regarding his Parkinson's disease.  States that he has been "fantastic."  He did have a postoperative MRI done since our last visit.  This is first he has ever had since his surgery.  The [facility] plane is approximately at contact number 2 on the lead.  We are currently actively using contact 1.  We have used contact 2 in the past successfully.  He is currently on carbidopa/levodopa 25/100; he has gone down a bit on this to about 4 and sometimes 4.5 tablets a day.  Is on Comtan 200 mg twice a day, Mirapex 1 mg 3 times a day and amantadine 100 mg 3 times a day for dyskinesia.  If he gets nervous, his legs will "twitch."  He is exercising on the bike in addition to doing physical labor.  Dropped his own programming to 3.2 and thinks that it has helped.  Did have 1-2 falls when "I get in a hurry."  Never got hurt.  Notices more trouble when he is at end of dosage.  08/15/15 update:  The patient is following up today regarding his Parkinson's disease.  He is on carbidopa/levodopa 25/100, 4- tablets per day (and then will take 2 extra 1/2 tablets as needed) along with entacapone, 200 mg twice a day.  He also takes pramipexole, 1 mg 3 times per day and amantadine 100 mg 3 times a day for dyskinesia.   Amantadine is causing dry mouth and he is using biotene to help that.   He has called several times since last  visit asking me about medical marijuana and its place in the treatment of Parkinson's disease.  I have talked to him about the position statement by the [employer].  He has not fallen since our last visit.  He reports increased dyskinesia only when he has increased stress.  Reports that he changed his DBS setting again back to where I had it previously (3.4).  He uses 2 flexeril at night for cramping.  He brings me articles to read on natural "supplements" and also asks me to look up a center in [place] that is owned by a "chiropractic neurologist" and has week long "camps" for patient to have Parkinson's disease that are supposed to help with healing of Parkinson's disease and not paid by insurance.  12/13/15 update:  The patient is following up today regarding his Parkinson's disease.  He is on carbidopa/levodopa 25/100, 4-5 tablets per day along with entacapone, 200 mg twice a day.  He also takes pramipexole, 1 mg 3 times per day and amantadine 100 mg 3 times a day for dyskinesia.  I reviewed records since last visit.  Fell at home on concrete on 11/30/15 and injured L calf.  Saw PCP few days later. Is better now.  States that he has learned that he just has to "stop" when he has a festinating gait.   No hallucinations.  No lightheadedness.    04/28/16 update:  He is on carbidopa/levodopa 25/100, 4-5 tablets per day (usually 4) along with entacapone, 200 mg twice a day.  He also takes pramipexole, 1 mg 3 times per day and amantadine 100 mg 3 times a day for dyskinesia.  He had started taking carbidopa/levodopa 50/200, one tablet in the AM as he thinks that this "gives me a 3 hour window before it is done."  He usually doesn't take it but sometimes he does.    This is odd dosing but he has rearranged his own medication to best suite him.  He has been physical therapy since our last visit.  He does think that it was helpful.  A U Step walker was recommended.  He isn't using it while out and  asks me what he can do for falls.  He does state that "my biggest falls are at home."  He does keep the walker next to him at bedtime because he finds that this is the most troublesome time when he gets up.  He finds that the top of his body gets ahead of the legs and that is why he falls.  Noting some trouble moving in confined spaces.  Reports that "I changed the DBS to 3.2 to see if it would help" but it didn't.  He is going to trainer 4-5 days a week.  Asks  if okay to hunt quail/fish if with someone.    09/01/16 update:  The patient returns today for follow-up.  He is on carbidopa/levodopa 25/100, 4 tablets throughout the day (and then 50% of the time he will add 1/2 extra tablet bid) and entacapone, 200 mg twice per day.  He also takes carbidopa/levodopa 50/200, one tablet in the morning prn.  He remains on Mirapex, 1 mg 3 times a day and amantadine 100 mg 3 times a day, for dyskinesia.  He did call me after our last visit and stated that he was falling, but admitted that he was not using his walker.  He was encouraged to start doing so.    He states today "I am not going to use that thing except at night."  States that he reduced his brain stimulator and "that didn't work so I went back up."  He has been going to rock steady boxing and thought that had helped.  Takes klonopin 0.5 mg for RBD and that is working well.  Needs a refill  12/28/16 update:  The patient returns today for follow-up, accompanied by his wife and daughter who supplement the history.  He is on carbidopa/levodopa 25/100, 4 tablets throughout the day (and then 50% of the time he will add 1/2 extra tablet bid) and entacapone, 200 mg twice per day.  He also takes carbidopa/levodopa 50/200, one tablet at night.  He remains on Mirapex, 1 mg 3 times a day (was going to try and drop that but had trouble cutting that one) and amantadine 100 mg 3 times a day, for dyskinesia.  He is doing boxing 3 days a week.  He asks me again about the [employer]  device.  States that he increased the DBS and when asked why he stated that he was hoping it would help his freezing/walking/balance.  02/24/17 update:  Patient seen today in follow-up, accompanied by his wife who supplements the history.  Patient is on carbidopa/levodopa, 25/100, 4 tablets during the day and entacapone 200 mg twice per day.  He takes 2 extra 1/2 tablets of carbidopa/levodopa 25/100 during the day as needed.   He takes carbidopa/levodopa 50/200 at bedtime.  Sometimes he takes 1/2 of a time released tablet.   Last visit, I told him he could try and reduce his pramipexole from 1 mg 3 times per day to 0.5 mg 3 times per day.  He states that he did that and noted no difference.  He remains on amantadine 100 mg 3 times per day.  He has had no hallucinations.  He continues to exercise with boxing and he loves it.  In regards to falls, his wife states that he is falling more.  He won't use the walker.  He is on klonopin for RBD and "I take one or two" and "it works really well."  He has noted last week his battery has reached "ERI."  06/23/17 update:  Pt seen in f/u for PD.  This patient is accompanied in the office by his spouse who supplements the history.  Patient is on carbidopa/levodopa 25/100, 4 tablets in the day.  He takes entacapone with 2 of these dosages.  He is on carbidopa/levodopa 50/200 at bedtime.  He sometimes takes this bid (one in the AM and one in PM) Last visit, I gave him a weaning schedule to get off of pramipexole.  He states that "I take a little here and there."  He is only taking 1/2 tablet prn per  patient, but then when he gives me a schedule of how he takes his medication, he includes 3 pramipexole per day.  He is still on amantadine, 100 mg 3 times per day.  He is on clonazepam 0.5 mg, 1-2 tablets at night for REM behavior disorder.  He has not had any acting out of the dreams.  He asks about freezing.  He doesn't like to use the walker because he doesn't want to be dependent  on it.  He had to give up hunting because of falls.  He is doing well with RSB.  He asks about cannabis oil.  He asks me about giving him IV thiamine.    10/20/17 update: Patient seen today in follow-up.  This patient is accompanied in the office by his spouse who supplements the history. I have received numerous phone calls from the patient and some from the family since our last visit.  His daughter called about concerns about his driving, but did not want [place] to know that she called.  I recommended that she reveal her concerns to the [facility] to generate paperwork for me, but that has not been done.  Patient has called and asked about clinical trials for nilotinib, but they were not accepting new patients and that trial has been closed for new patients.  He called me both about freezing as well as dyskinesia.  He  called me about Inbrija.  He asks about this today and discussed that this will make his dyskinesia worse.  He still wants to consider. He got stuck one time in a restaurant.   He has met with our social worker in the office since our last visit.  He called and requested a referral to the interdisciplinary clinic at [facility], but when he was contacted he refused to make an appointment.  His daughter called back and has an appt in early May.   He requested transfer to a nurse practitioner in [place], but ultimately after discussion with his primary care physician, he decided to follow-up here.  Medication wise, he remains on carbidopa/levodopa 25/100, 4 tablets throughout the day (occasionally takes an extra) and he takes entacapone with 2 of these dosages.  He is no longer on carbidopa/levodopa 50/200 at bedtime.  He is on amantadine, 100 mg, 3 times per day.  Told him again last visit to discontinue the pramipexole.  Using alarmed pill box and wife states that they are finding this beneficial.  States that biggest c/o is "timing out."  He cannot describing what timing out means but after multiple questions,  may mean freezing.  Wife thinks that since meds more regular doing better but she does not some freezing.  She notes interference with protein when eating.  Going to boxing 3 days a week.  12/15/17 update: Patient is seen today in follow-up.  I have reviewed records since last visit.  He is accompanied by his wife who supplements history.  Patient saw [name] on Nov 17, 2016.  Those records are not yet completed.  I do have a new dosing schedule that she gave him which increased his levodopa from 4 tablets to 5 tablets/day by taking 2 tablets at 7:30 AM/1 tablet at 10 AM/1 tablet at 1:30 PM/1 tablet at 4 PM and 1 tablet at 7 PM.  He was to take entacapone with each of those dosages and amantadine 3 times per day.   He didn't find that taking the entacapone with each dose of levodopa helped and he  had leg cramping and so is back taking it with taking comtan every other dose.   Today, he didn't take the 2 carbidopa/levodopa 25/100 in the AM.   [name] scheduled an appointment with [name], which is now scheduled for December 30, 2017.  The patient wanted work to back into our clinic before that appointment with [name].  He was given samples of inbrija last visit and states that he could not afford the medication as it was $1600 per month.  States that he has an approval from his insurance.  He didn't qualify for patient assist.    03/08/18 update: Patient is seen today in follow-up for Parkinson's disease.  He is accompanied by his son in law (never been here) who supplements the history.  He has seen Dr. Orland Mustard at [facility] since our last visit.  I have reviewed those records.  His programming was changed somewhat.  Like me, Dr. Orland Mustard was hesitant to recommend DBS for the other side because he was concerned about cognitive impairment.  Pt reports dyskinesia is better and son in law reports it is as well.  He has it when stressed or pushed.  He is freezing in the AM.   Patient remains on carbidopa/levodopa  25/100, 2 tablets at 7:30 AM/1 tablet at 10 AM/1 tablet at 1:00 PM/1 tablet at 4 PM and 1 tablet at 7 PM.  Told him to try last visit the schedule that [name] had given him for taking entacapone with each levodopa dose.   He is still on amantadine, 100 mg twice per day.  Pt states that falls are reduced as "I have learned how to manage it."  Son in law states that falls usually occur when doing things that he shouldn't be doing. No hallucinations.  No lightheadedness/near syncope.  He quit boxing as he felt that it interrupted his med regimen.  Is riding stationary bike.  Asks about having DBS surgery on the other side.  Family is concerned about driving.  Patient is concerned about his wife who he feels has dementia  08/03/18 update: Patient is seen today in follow-up for Parkinson's disease accompanied by his wife and daughter who supplements the history.  He is currently on carbidopa/levodopa 25/100, 2 tablets at 7:30 AM/1 tablet at 10 AM/1 tablet at 1 PM/1 tablet at 4 PM/1 tablet at 7 PM.  He takes entacapone with each of these dosages. He was to restart carbidopa/levodopa 50/200 for first morning on but he didn't do that as "I didn't want to put too much dopamine in my system."   He is on amantadine, 100 mg three per day.  Records are reviewed since last visit.  Patient had previously seen both [name] and [name] at [facility].  He then went to [facility] on May 18, 2018 for another consult with [name], as both [name] and I were leery about doing DBS on the other side because of cognitive issues.  [name] requested neurocognitive testing.  He decided not to pursue that.  Was given samples last visit of Inbrija to try and "I thought it was junk."  States that it worked some times and other times it didn't.    He is no longer driving and has turned in his license.  Asks about rytary.  Asks about nourianz.  PREVIOUS MEDICATIONS: Sinemet, Mirapex and azilect; inbrija; amantadine  ALLERGIES:     Allergies  Allergen Reactions  . Amoxicillin   . Celebrex [Celecoxib]   .  Demerol Other (See Comments)    Hallucinations   . Penicillins     REACTION: RASH    CURRENT MEDICATIONS:  Current Outpatient Medications on File Prior to Visit  Medication Sig Dispense Refill  . carbidopa-levodopa (SINEMET IR) 25-100 MG tablet 2 at 7:30am, 1 at 10am, 1 at 1pm, 1 at 4pm, 1 at 7pm 540 tablet 1  . clonazePAM (KLONOPIN) 0.5 MG tablet Take 1-2 tablets (0.5-1 mg total) by mouth at bedtime as needed. 60 tablet 0  . cyclobenzaprine (FLEXERIL) 10 MG tablet Take 2 tablets (20 mg total) by mouth at bedtime. 60 tablet 5  . entacapone (COMTAN) 200 MG tablet Take 1 tablet (200 mg total) by mouth 5 (five) times daily. Take with Levodopa 450 tablet 1  . finasteride (PROSCAR) 5 MG tablet Take 1 tablet (5 mg total) by mouth daily. 30 tablet 11   No current facility-administered medications on file prior to visit.     PAST MEDICAL HISTORY:   Past Medical History:  Diagnosis Date  . Arthritis    left hip   . GERD (gastroesophageal reflux disease) Pre 2002   Mild  . History of kidney stones   . 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 Parkinson disease ([place]) 2004   Slowly progressive  . Ruptured appendix teens    PAST SURGICAL HISTORY:   Past Surgical History:  Procedure Laterality Date  . APPENDECTOMY    . CATARACT EXTRACTION W/ INTRAOCULAR LENS  IMPLANT, BILATERAL  2017  . DEEP BRAIN STIMULATOR PLACEMENT  8/14   L STN  . DOPPLER ECHOCARDIOGRAPHY  01/10/2009   LV NML Mild LVH EF 60-65% aortic sclerosis w/0 stenosis   . JOINT REPLACEMENT     left hip replacement 08/14/11/[place]   . Neuro evaluation  12/19/2004   [name], benign tremor  . SUBTHALAMIC STIMULATOR BATTERY REPLACEMENT Left 03/25/2017   Procedure: Deep Brain Stimulator battery replacement;  Surgeon: Erline Levine, MD;  Location: [facility];  Service:  Neurosurgery;  Laterality: Left;  left  . TONSILLECTOMY    . TOTAL HIP REVISION  08/27/2011   Procedure: TOTAL HIP REVISION;  Surgeon: Mauri Pole, MD;  Location: [facility];  Service: Orthopedics;  Laterality: Left;    SOCIAL HISTORY:   Social History   Socioeconomic History  . Marital status: Married    Spouse name: Not on file  . Number of children: 2  . Years of education: Not on file  . Highest education level: Not on file  Occupational History  . 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 Financial resource strain: Not on file  . Food insecurity:    Worry: Not on file    Inability: Not on file  . Transportation needs:    Medical: Not on file    Non-medical: Not on file  Tobacco Use  . Smoking status: Never Smoker  . Smokeless tobacco: Never Used  Substance and Sexual Activity  . Alcohol use: Yes    Alcohol/week: 7.0 standard drinks    Types: 7 Standard drinks or equivalent per week    Comment: occassionally  . Drug use: No  . Sexual activity: Yes  Lifestyle  . Physical activity:    Days per week: Not on file    Minutes per session: Not on file  . Stress: Not on file  Relationships  . Social connections:    Talks on phone:  Not on file    Gets together: Not on file    Attends religious service: Not on file    Active member of club or organization: Not on file    Attends meetings of clubs or organizations: Not on file    Relationship status: Not on file  . Intimate partner violence:    Fear of current or ex partner: Not on file    Emotionally abused: Not on file    Physically abused: Not on file    Forced sexual activity: Not on file  Other Topics Concern  . Not on file  Social History Narrative   Married, lives with wife   2 daughters      Has living will   Wife has health care POA---then daughters   Would still accept CPR--but no prolonged artificial means (ventilator or tube feeds)    FAMILY HISTORY:   Family Status    Relation Name Status  . Mother  Deceased at age 46       knee replacement  . Father  Deceased at age 26       tremor  . Sister  Stage manager in [place]  . Annamarie Major  (Not Specified)    ROS:  Review of Systems  Constitutional: Negative.   HENT: Negative.   Eyes: Negative.   Respiratory: Negative.   Cardiovascular: Negative.   Gastrointestinal: Negative.   Genitourinary: Negative.   Musculoskeletal: Negative.   Skin: Negative.      PHYSICAL EXAMINATION:    VITALS:   Vitals:   08/03/18 1055  BP: 110/62  Pulse: 62  SpO2: 98%  Weight: 157 lb (71.2 kg)  Height: 6' (1.829 m)   Wt Readings from Last 3 Encounters:  08/03/18 157 lb (71.2 kg)  06/30/18 157 lb (71.2 kg)  06/24/18 154 lb (69.9 kg)    GEN:  The patient appears stated age and is in NAD. HEENT:  Normocephalic, atraumatic.  The mucous membranes are moist. The superficial temporal arteries are without ropiness or tenderness. CV:  RRR Lungs:  CTAB Neck/HEME:  There are no carotid bruits bilaterally.  Neurological examination:  Orientation: The patient is alert and oriented x3. Cranial nerves: There is good facial symmetry. The speech is fluent and clear. Soft palate rises symmetrically and there is no tongue deviation. Hearing is intact to conversational tone. Sensation: Sensation is intact to light touch throughout Motor: Strength is 5/5 in the bilateral upper and lower extremities.   Shoulder shrug is equal and symmetric.  There is no pronator drift.  Movement examination: Tone: There is normal tone in the UE/LE Abnormal movements: mild dyskinesia in the R leg Coordination:  There is mild decremation with RAM's, with any form of RAMS, including alternating supination and pronation of the forearm, hand opening and closing, finger taps, heel taps and toe taps. Gait and Station: The patient pushes off of the chair.  He is given a walker.  He freezes in the doorway.  The L foot occasionally  sticks to the ground.  He generally walks well with the walker.    LABS    Chemistry      Component Value Date/Time   NA 137 06/24/2018 1023   NA 142 08/11/2016 1701   NA 139 08/15/2011 0535   K 4.0 06/24/2018 1023   K 3.8 08/15/2011 0535   CL 102 06/24/2018 1023   CL 101 08/15/2011 0535   CO2 27 06/24/2018 1023  CO2 29 08/15/2011 0535   BUN 21 06/24/2018 1023   BUN 18 08/11/2016 1701   BUN 12 08/15/2011 0535   CREATININE 0.87 06/24/2018 1023   CREATININE 0.73 08/15/2011 0535      Component Value Date/Time   CALCIUM 9.0 06/24/2018 1023   CALCIUM 7.6 (L) 08/15/2011 0535   ALKPHOS 59 06/24/2018 1023   AST 11 06/24/2018 1023   ALT 2 06/24/2018 1023   BILITOT 1.5 (H) 06/24/2018 1023   BILITOT 1.2 08/11/2016 1701     Lab Results  Component Value Date   TSH 2.82 03/16/2012    DBS programming was performed today which is described in more detail on a separate programming procedural notes.    ASSESSMENT/PLAN:  1. Parkinson's disease.    -The patient is status post left [facility] DBS [facility] on 03/14/2013.    -Continue carbidopa/levodopa 25/100, 2 tablets at 7:30 AM/1 tablet at 10 AM/1 tablet at 1:00 PM/1 tablet at 4 PM and 1 tablet at 7 PM.  Take entacapone with each of these dosages.  He can take a single extra tablet as needed, but I told him not to take more than that throughout the day.  -he was supposed to take carbidopa/levodopa 50/200 for morning on but didn't so will remain off of it  -asks about nourianz and rytary today.  I don't recommend.  He looks adequately treated  - for now continue Amantadine, 100 mg tid.  May contribute to memory issues and may need to wean medication in the future.   -Long discussion with the patient and his family.  He has now been to myself, [name], [name] and [name] regarding doing DBS on the other side.  Each of [place] has expressed some hesitation regarding memory.  I told him that if he goes to enough surgeons, he will eventually  get someone to do the surgery, but the consequence may not be something that he desires.  He ultimately had already decided before he came back today that he did not want surgery (I think because of family pressure). 2.  Memory change  -we scheduled neurocog testing but the patient cancelled it and didn't reschedule.  Discussed that today, but he does not wish to proceed.  -Patient is no longer driving.  He asks me about this today.  He has already hired a Geophysicist/field seismologist and I think he should continue with that. 3.  Constipation  -Was made worse by amantadine.  He is using the rancho recipe.  He is using Biotene for the dry mouth but thinks that is from the amantadine as well and it may be. 3.  RBD  -on klonopin, prescribed by PCP 4.  Follow-up in 5 to 6 months, sooner should new neurologic issues arise.  Greater than 50% of the 40-minute visit was spent in counseling with the patient and his family.  This did not include DBS time.

## 2018-08-02 NOTE — Telephone Encounter (Signed)
Pt called office wanting to know if he shoud come in for a xray on his chest. Pt states he has had a bad cough and that he spoke with Dr.Letvak about an xray. Pt is requesting a call. Best # [phone]

## 2018-08-03 ENCOUNTER — Encounter: Payer: Self-pay | Admitting: Neurology

## 2018-08-03 ENCOUNTER — Ambulatory Visit (INDEPENDENT_AMBULATORY_CARE_PROVIDER_SITE_OTHER): Payer: Medicare Other | Admitting: Neurology

## 2018-08-03 VITALS — BP 110/62 | HR 62 | Ht 72.0 in | Wt 157.0 lb

## 2018-08-03 DIAGNOSIS — G20 Parkinson's disease: Secondary | ICD-10-CM | POA: Diagnosis not present

## 2018-08-03 DIAGNOSIS — R262 Difficulty in walking, not elsewhere classified: Secondary | ICD-10-CM

## 2018-08-03 MED ORDER — AMANTADINE HCL 100 MG PO CAPS: ORAL_CAPSULE | ORAL | 1 refills | Status: DC

## 2018-08-03 NOTE — Telephone Encounter (Signed)
See x-ray report 

## 2018-08-03 NOTE — Procedures (Signed)
DBS Programming was performed.    Total time spent reviewing programming was 5 minutes.  Device was confirmed to be on.  Soft start was confirmed to be on.  Impedences were checked and were within normal limits.   All impedances were well within normal limits and are recorded on a separate worksheet.  Battery was checked and was determined to be functioning normally (2.94).  Final settings were as follows:  Left brain electrode, Group A:     2-C+; Amplitude  3.7   V   ; Pulse width 60 microseconds;   Frequency   135   Hz.  Left brain electrode, group B:  1-C+, amplitude: 3.2 V, PW: 90, frequency 140  Group A was active at the time pt left the office.  Right brain electrode:     n/a

## 2018-08-08 ENCOUNTER — Telehealth: Payer: Self-pay | Admitting: *Deleted

## 2018-08-08 ENCOUNTER — Telehealth: Payer: Self-pay

## 2018-08-08 NOTE — Telephone Encounter (Signed)
Pt called and needs a driving certificate and wants to speak with Dr Silvio Pate only. While I was typing this message he said to let him ck with a friend to see if he would sign driving certificate and pt will cb if needed. Nothing further needed at this time.

## 2018-08-08 NOTE — Telephone Encounter (Signed)
Okay to prepare one for me to sign

## 2018-08-08 NOTE — Telephone Encounter (Signed)
Left message for pt to call office. I need to clear some stuff up.

## 2018-08-08 NOTE — Telephone Encounter (Signed)
I have the handicap placard form but what should the letter say?

## 2018-08-08 NOTE — Telephone Encounter (Signed)
Spoke to pt who states he is needing a new handicap placard and letter stating it is ok for him to drive. Pt states DMV has already cleared him and given him his license back. pls advise

## 2018-08-08 NOTE — Telephone Encounter (Signed)
I am not going to write the letter till I see that he has been cleared.  I cannot make that determination---if DMV cleared him, he doesn't need a letter from me I would have to send him for an occupational therapy evaluation to assess his driving--because I have my doubts without seeing him behind the wheel

## 2018-08-10 NOTE — Telephone Encounter (Signed)
Pt called back and notified as instructed.Pt voiced understanding and pt stated DMV has cleared pt and given him his license back; pt said he would not get the letter of clearance but did request the handicap placard form to be mailed to his verified home address. Handicap placard mailed as requested to home address and copy sent for scanning.

## 2018-08-12 ENCOUNTER — Telehealth: Payer: Self-pay | Admitting: Urology

## 2018-08-12 ENCOUNTER — Encounter: Payer: Self-pay | Admitting: Urology

## 2018-08-12 ENCOUNTER — Other Ambulatory Visit: Payer: Self-pay

## 2018-08-12 ENCOUNTER — Ambulatory Visit (INDEPENDENT_AMBULATORY_CARE_PROVIDER_SITE_OTHER): Payer: Medicare Other | Admitting: Urology

## 2018-08-12 VITALS — BP 117/56 | HR 69 | Ht 72.0 in | Wt 153.5 lb

## 2018-08-12 DIAGNOSIS — N138 Other obstructive and reflux uropathy: Secondary | ICD-10-CM

## 2018-08-12 DIAGNOSIS — R350 Frequency of micturition: Secondary | ICD-10-CM | POA: Diagnosis not present

## 2018-08-12 DIAGNOSIS — N401 Enlarged prostate with lower urinary tract symptoms: Secondary | ICD-10-CM

## 2018-08-12 DIAGNOSIS — R32 Unspecified urinary incontinence: Secondary | ICD-10-CM

## 2018-08-12 LAB — URINALYSIS, COMPLETE
Bilirubin, UA: NEGATIVE
Leukocytes, UA: NEGATIVE
Nitrite, UA: NEGATIVE
RBC, UA: NEGATIVE
Specific Gravity, UA: >1.03 — ABNORMAL HIGH (ref 1.005–1.030)
Urobilinogen, Ur: 0.2 mg/dL (ref 0.2–1.0)
pH, UA: 5 (ref 5.0–7.5)

## 2018-08-12 LAB — MICROSCOPIC EXAMINATION
Epithelial Cells (non renal): NONE SEEN /hpf (ref 0–10)
RBC, UA: NONE SEEN /hpf (ref 0–2)

## 2018-08-12 LAB — BLADDER SCAN AMB NON-IMAGING

## 2018-08-12 MED ORDER — CLONAZEPAM 0.5 MG PO TABS: 0.5000 mg | ORAL_TABLET | Freq: Every evening | ORAL | 0 refills | Status: DC | PRN

## 2018-08-12 MED ORDER — TAMSULOSIN HCL 0.4 MG PO CAPS: 0.4000 mg | ORAL_CAPSULE | Freq: Every day | ORAL | 11 refills | Status: DC

## 2018-08-12 NOTE — Telephone Encounter (Signed)
Last filled 06-30-18 #60 Last OV 06-30-18 No Future OV [facility]

## 2018-08-12 NOTE — Telephone Encounter (Signed)
Pt called wanting a same day appt for "severe incontinence" I added him on at 1:45pm. Thanks, Rosann Auerbach

## 2018-08-12 NOTE — Progress Notes (Signed)
08/12/2018 1:24 PM   Casey Tucker 1941/05/06 [id number]  Referring provider: [name], MD [address] [place], [place] [address]  Chief Complaint  Patient presents with  . New Patient (Initial Visit)    incontinence    HPI:  Patient returns today with complaints  Frequency, urgency, hesitancy and urge incontinence. His constipation resolved. PVR 0 today. No dysuria or hematuria.   He was seen in August 2019 for urgency, dysuria, frequency and nocturia.  Neurogenic risk includes Parkinson's.  His PVR was 35.  He may have issues with constipation.  He has a history of BPH on finasteride. PSA in 2012 was 0.7.    PMH: Past Medical History:  Diagnosis Date  . Arthritis    left hip   . GERD (gastroesophageal reflux disease) Pre 2002   Mild  . History of kidney stones   . 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 Parkinson disease ([place]) 2004   Slowly progressive  . Ruptured appendix teens    Surgical History: Past Surgical History:  Procedure Laterality Date  . APPENDECTOMY    . CATARACT EXTRACTION W/ INTRAOCULAR LENS  IMPLANT, BILATERAL  2017  . DEEP BRAIN STIMULATOR PLACEMENT  8/14   L STN  . DOPPLER ECHOCARDIOGRAPHY  01/10/2009   LV NML Mild LVH EF 60-65% aortic sclerosis w/0 stenosis   . JOINT REPLACEMENT     left hip replacement 08/14/11/[facility]   . Neuro evaluation  12/19/2004   [name], benign tremor  . SUBTHALAMIC STIMULATOR BATTERY REPLACEMENT Left 03/25/2017   Procedure: Deep Brain Stimulator battery replacement;  Surgeon: Erline Levine, MD;  Location: [facility];  Service: Neurosurgery;  Laterality: Left;  left  . TONSILLECTOMY    . TOTAL HIP REVISION  08/27/2011   Procedure: TOTAL HIP REVISION;  Surgeon: Mauri Pole, MD;  Location: [facility];  Service: Orthopedics;  Laterality: Left;    Home Medications:  Allergies as of 08/12/2018      Reactions   Amoxicillin      Celebrex [celecoxib]    Demerol Other (See Comments)   Hallucinations    Penicillins    REACTION: RASH      Medication List       Accurate as of August 12, 2018  1:24 PM. Always use your most recent med list.        amantadine 100 MG capsule Commonly known as:  SYMMETREL TAKE ONE CAPSULE THREE TIMES A DAY   carbidopa-levodopa 25-100 MG tablet Commonly known as:  SINEMET IR 2 at 7:30am, 1 at 10am, 1 at 1pm, 1 at 4pm, 1 at 7pm   clonazePAM 0.5 MG tablet Commonly known as:  KLONOPIN Take 1-2 tablets (0.5-1 mg total) by mouth at bedtime as needed.   cyclobenzaprine 10 MG tablet Commonly known as:  FLEXERIL Take 2 tablets (20 mg total) by mouth at bedtime.   entacapone 200 MG tablet Commonly known as:  COMTAN Take 1 tablet (200 mg total) by mouth 5 (five) times daily. Take with Levodopa   finasteride 5 MG tablet Commonly known as:  PROSCAR Take 1 tablet (5 mg total) by mouth daily.       Allergies:  Allergies  Allergen Reactions  . Amoxicillin   . Celebrex [Celecoxib]   . Demerol Other (See Comments)    Hallucinations   . Penicillins     REACTION: RASH    Family History: Family History  Problem Relation Age of Onset  . Arthritis Mother        knee replacement  . COPD Father        emphysema, smoker  . Heart disease Father        CHF  . Alcohol abuse Paternal Uncle     Social History:  reports that he has never smoked. He has never used smokeless tobacco. He reports current alcohol use of about 7.0 standard drinks of alcohol per week. He reports that he does not use drugs.  ROS:                                        Physical Exam: There were no vitals taken for this visit.  Constitutional:  Alert and oriented, No acute distress. HEENT: [place] AT, moist mucus membranes.  Trachea midline, no masses. Respiratory: Normal respiratory effort, no increased work of breathing. GI: Abdomen is soft, nontender, nondistended, no  abdominal masses GU: No CVA tenderness Skin: No rashes, bruises or suspicious lesions. Neurologic: Grossly intact, no focal deficits, moving all 4 extremities. Psychiatric: Normal mood and affect.  Laboratory Data: Lab Results  Component Value Date   WBC 5.0 06/24/2018   HGB 9.6 (L) 06/24/2018   HCT 27.3 (L) 06/24/2018   MCV 116.8 (H) 06/24/2018   PLT 269.0 06/24/2018    Lab Results  Component Value Date   CREATININE 0.87 06/24/2018    Lab Results  Component Value Date   PSA 0.73 01/28/2011   PSA 0.71 01/01/2010   PSA 0.79 12/31/2008    No results found for: TESTOSTERONE  No results found for: HGBA1C  Urinalysis    Component Value Date/Time   COLORURINE Amber 08/13/2011 1127   APPEARANCEUR Clear 03/14/2018 1319   LABSPEC 1.027 08/13/2011 1127   PHURINE 5.0 08/13/2011 1127   GLUCOSEU Negative 03/14/2018 1319   GLUCOSEU Negative 08/13/2011 1127   HGBUR Negative 08/13/2011 1127   BILIRUBINUR Negative 03/14/2018 1319   BILIRUBINUR Negative 08/13/2011 1127   KETONESUR Trace 08/13/2011 1127   PROTEINUR 1+ (A) 03/14/2018 1319   PROTEINUR 100 mg/dL 08/13/2011 1127   UROBILINOGEN 0.2 01/21/2018 1049   NITRITE Negative 03/14/2018 1319   NITRITE Negative 08/13/2011 1127   LEUKOCYTESUR Negative 03/14/2018 1319   LEUKOCYTESUR Negative 08/13/2011 1127    Lab Results  Component Value Date   LABMICR See below: 03/14/2018   WBCUA 0-5 03/14/2018   RBCUA 0-2 03/14/2018   LABEPIT None seen 03/14/2018   MUCUS Present (A) 03/14/2018   BACTERIA Moderate (A) 03/14/2018    No results found for this or any previous visit. No results found for this or any previous visit. No results found for this or any previous visit. No results found for this or any previous visit. No results found for this or any previous visit. No results found for this or any previous visit. No results found for this or any previous visit. No results found for this or any previous visit.  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 Will add tamsulosin and then consider OAB med if needed. Also consider PTNS. Discussed the nature r/b/a to alpha blockers. He's already had cataracts removed.   Urgency   There are no diagnoses linked to this encounter.  No follow-ups on file.  Festus Aloe, MD  [facility] [address] [place],  [address] [phone]

## 2018-08-15 DIAGNOSIS — D2271 Melanocytic nevi of right lower limb, including hip: Secondary | ICD-10-CM | POA: Diagnosis not present

## 2018-08-15 DIAGNOSIS — L570 Actinic keratosis: Secondary | ICD-10-CM | POA: Diagnosis not present

## 2018-08-15 DIAGNOSIS — D225 Melanocytic nevi of trunk: Secondary | ICD-10-CM | POA: Diagnosis not present

## 2018-08-15 DIAGNOSIS — D2262 Melanocytic nevi of left upper limb, including shoulder: Secondary | ICD-10-CM | POA: Diagnosis not present

## 2018-08-15 DIAGNOSIS — X32XXXA Exposure to sunlight, initial encounter: Secondary | ICD-10-CM | POA: Diagnosis not present

## 2018-08-15 DIAGNOSIS — D2261 Melanocytic nevi of right upper limb, including shoulder: Secondary | ICD-10-CM | POA: Diagnosis not present

## 2018-08-15 DIAGNOSIS — Z85828 Personal history of other malignant neoplasm of skin: Secondary | ICD-10-CM | POA: Diagnosis not present

## 2018-08-16 LAB — CULTURE, URINE COMPREHENSIVE

## 2018-08-19 ENCOUNTER — Telehealth: Payer: Self-pay | Admitting: Neurology

## 2018-08-19 NOTE — Telephone Encounter (Signed)
Casey Tucker is not back on Monday.  Please call him back and find out what he needs.  If he is calling about nourianz, we have already talked about this with him (I talked about it in the room with him and then he came back that same day and asked Caryl Pina about it).  I don't recommend for him

## 2018-08-19 NOTE — Telephone Encounter (Signed)
Called and spoke with Pt. He is calling to advise [place] his insurance will require a PA for cyclobenzaprine going forward. He would like to remain on this medication and is requesting a PA be initiated through [place] coverage determination telephone # [phone]

## 2018-08-19 NOTE — Telephone Encounter (Signed)
Patient is calling in needing to speak with you about medication. I had a hard time understanding him so im not sure which one. Please call him back. He said he wanted to wait and talk to you on Monday when you got back instead of speaking to someone else. Thanks!

## 2018-08-22 NOTE — Telephone Encounter (Signed)
I won't change it for now since he wants to stay on it.

## 2018-08-31 ENCOUNTER — Ambulatory Visit (INDEPENDENT_AMBULATORY_CARE_PROVIDER_SITE_OTHER): Payer: Medicare Other | Admitting: Urology

## 2018-08-31 ENCOUNTER — Encounter: Payer: Self-pay | Admitting: Urology

## 2018-08-31 VITALS — BP 105/55 | HR 58 | Ht 73.0 in | Wt 156.0 lb

## 2018-08-31 DIAGNOSIS — N401 Enlarged prostate with lower urinary tract symptoms: Secondary | ICD-10-CM | POA: Diagnosis not present

## 2018-08-31 DIAGNOSIS — R3915 Urgency of urination: Secondary | ICD-10-CM | POA: Diagnosis not present

## 2018-08-31 DIAGNOSIS — N138 Other obstructive and reflux uropathy: Secondary | ICD-10-CM

## 2018-08-31 NOTE — Progress Notes (Signed)
08/31/2018 11:48 AM   Casey Tucker [birth year] [id number]  Referring provider: [name], MD [address] [place], [place] [address]  Chief Complaint  Patient presents with  . Follow-up    HPI:  Patient returns today for frequency, urgency, hesitancy and urge incontinence. His constipation resolved. PVR was 0. No dysuria or hematuria. Tamsulosin was added to finasteride a couple of weeks ago. He's back a little quick but is doing  much better. He has not had an urge incontinence episode and stream is better. He asked about PCa screening.   He was seen in August 2019 for urgency, dysuria, frequency and nocturia.  Neurogenic risk includes Parkinson's.  His PVR was 35.  He may have issues with constipation.  He has a history of BPH on finasteride. PSA in 2012 was 0.7.    PMH: Past Medical History:  Diagnosis Date  . Arthritis    left hip   . GERD (gastroesophageal reflux disease) Pre 2002   Mild  . History of kidney stones   . 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 Parkinson disease ([place]) 2004   Slowly progressive  . Ruptured appendix teens    Surgical History: Past Surgical History:  Procedure Laterality Date  . APPENDECTOMY    . CATARACT EXTRACTION W/ INTRAOCULAR LENS  IMPLANT, BILATERAL  2017  . DEEP BRAIN STIMULATOR PLACEMENT  8/14   L STN  . DOPPLER ECHOCARDIOGRAPHY  01/10/2009   LV NML Mild LVH EF 60-65% aortic sclerosis w/0 stenosis   . JOINT REPLACEMENT     left hip replacement 08/14/11/[facility]   . Neuro evaluation  12/19/2004   [name], benign tremor  . SUBTHALAMIC STIMULATOR BATTERY REPLACEMENT Left 03/25/2017   Procedure: Deep Brain Stimulator battery replacement;  Surgeon: Erline Levine, MD;  Location: [facility];  Service: Neurosurgery;  Laterality: Left;  left  . TONSILLECTOMY    . TOTAL HIP REVISION  08/27/2011   Procedure: TOTAL HIP REVISION;  Surgeon:  Mauri Pole, MD;  Location: [facility];  Service: Orthopedics;  Laterality: Left;    Home Medications:  Allergies as of 08/31/2018      Reactions   Amoxicillin    Celebrex [celecoxib]    Demerol Other (See Comments)   Hallucinations    Penicillins    REACTION: RASH      Medication List       Accurate as of August 31, 2018 11:48 AM. Always use your most recent med list.        amantadine 100 MG capsule Commonly known as:  SYMMETREL TAKE ONE CAPSULE THREE TIMES A DAY   carbidopa-levodopa 25-100 MG tablet Commonly known as:  SINEMET IR 2 at 7:30am, 1 at 10am, 1 at 1pm, 1 at 4pm, 1 at 7pm   clonazePAM 0.5 MG tablet Commonly known as:  KLONOPIN Take 1-2 tablets (0.5-1 mg total) by mouth at bedtime as needed.   cyclobenzaprine 10 MG tablet Commonly known as:  FLEXERIL Take 2 tablets (20 mg total) by mouth at bedtime.   entacapone 200 MG tablet Commonly known as:  COMTAN Take 1 tablet (200 mg total) by mouth 5 (five) times daily. Take with Levodopa   finasteride 5 MG tablet Commonly known as:  PROSCAR Take 1 tablet (5 mg total) by mouth daily.   tamsulosin 0.4 MG Caps capsule Commonly known as:  FLOMAX Take 1 capsule (0.4 mg total) by mouth daily  after supper.       Allergies:  Allergies  Allergen Reactions  . Amoxicillin   . Celebrex [Celecoxib]   . Demerol Other (See Comments)    Hallucinations   . Penicillins     REACTION: RASH    Family History: Family History  Problem Relation Age of Onset  . Arthritis Mother        knee replacement  . COPD Father        emphysema, smoker  . Heart disease Father        CHF  . Alcohol abuse Paternal Uncle     Social History:  reports that he has never smoked. He has never used smokeless tobacco. He reports current alcohol use of about 7.0 standard drinks of alcohol per week. He reports that he does not use drugs.  ROS: UROLOGY Frequent Urination?: Yes Hard to postpone urination?: No Burning/pain with  urination?: No Get up at night to urinate?: Yes Leakage of urine?: No Urine stream starts and stops?: No Trouble starting stream?: Yes Do you have to strain to urinate?: No Blood in urine?: No Urinary tract infection?: No Sexually transmitted disease?: No Injury to kidneys or bladder?: No Painful intercourse?: No Weak stream?: No Erection problems?: No Penile pain?: No  Gastrointestinal Nausea?: No Vomiting?: No Indigestion/heartburn?: No Diarrhea?: No Constipation?: No  Constitutional Fever: No Night sweats?: No Weight loss?: No Fatigue?: No  Skin Skin rash/lesions?: No Itching?: No  Eyes Blurred vision?: No Double vision?: No  Ears/Nose/Throat Sore throat?: No Sinus problems?: No  Hematologic/Lymphatic Swollen glands?: No Easy bruising?: No  Cardiovascular Leg swelling?: No Chest pain?: No  Respiratory Cough?: No Shortness of breath?: No  Endocrine Excessive thirst?: No  Musculoskeletal Back pain?: No Joint pain?: No  Neurological Headaches?: No Dizziness?: No  Psychologic Depression?: No Anxiety?: No  Physical Exam: Ht 6\' 1"  (1.854 m)   Wt 70.8 kg   BMI 20.58 kg/m   Constitutional:  Alert and oriented, No acute distress. HEENT: [place] AT, moist mucus membranes.  Trachea midline, no masses. Cardiovascular: No clubbing, cyanosis, or edema. Respiratory: Normal respiratory effort, no increased work of breathing. GI: Abdomen is soft, nontender, nondistended, no abdominal masses Lymph: No cervical or inguinal lymphadenopathy. Skin: No rashes, bruises or suspicious lesions. Neurologic: Grossly intact, no focal deficits, moving all 4 extremities. Psychiatric: Normal mood and affect. GU/DRE: 20 grams, no hard areas or nodules   Laboratory Data: Lab Results  Component Value Date   WBC 5.0 06/24/2018   HGB 9.6 (L) 06/24/2018   HCT 27.3 (L) 06/24/2018   MCV 116.8 (H) 06/24/2018   PLT 269.0 06/24/2018    Lab Results  Component Value Date    CREATININE 0.87 06/24/2018    Lab Results  Component Value Date   PSA 0.73 01/28/2011   PSA 0.71 01/01/2010   PSA 0.79 12/31/2008    No results found for: TESTOSTERONE  No results found for: HGBA1C  Urinalysis    Component Value Date/Time   COLORURINE Amber 08/13/2011 1127   APPEARANCEUR Clear 08/12/2018 1412   LABSPEC 1.027 08/13/2011 1127   PHURINE 5.0 08/13/2011 1127   GLUCOSEU Trace (A) 08/12/2018 1412   GLUCOSEU Negative 08/13/2011 1127   HGBUR Negative 08/13/2011 1127   BILIRUBINUR Negative 08/12/2018 1412   BILIRUBINUR Negative 08/13/2011 1127   KETONESUR Trace 08/13/2011 1127   PROTEINUR 1+ (A) 08/12/2018 1412   PROTEINUR 100 mg/dL 08/13/2011 1127   UROBILINOGEN 0.2 01/21/2018 1049   NITRITE Negative 08/12/2018 1412  NITRITE Negative 08/13/2011 1127   LEUKOCYTESUR Negative 08/12/2018 1412   LEUKOCYTESUR Negative 08/13/2011 1127    Lab Results  Component Value Date   LABMICR See below: 08/12/2018   WBCUA 0-5 08/12/2018   RBCUA None seen 08/12/2018   LABEPIT None seen 08/12/2018   MUCUS Present (A) 08/12/2018   BACTERIA Moderate (A) 08/12/2018     No results found for this or any previous visit. No results found for this or any previous visit. No results found for this or any previous visit. No results found for this or any previous visit. No results found for this or any previous visit. No results found for this or any previous visit. No results found for this or any previous visit. No results found for this or any previous visit.  Assessment & Plan:    Bph, luts, urgency - doing better on tamsulosin and finasteride. Will continue. Discussed the nature r/b of PSA screening and he wants to check one.   No follow-ups on file.  Festus Aloe, MD  [facility] [address] [place], [place] [address] [phone]

## 2018-08-31 NOTE — Patient Instructions (Signed)

## 2018-09-01 LAB — PSA: Prostate Specific Ag, Serum: 0.1 ng/mL (ref 0.0–4.0)

## 2018-09-06 ENCOUNTER — Telehealth: Payer: Self-pay

## 2018-09-06 NOTE — Telephone Encounter (Signed)
Pt last seen 07/01/19 for annual exam; pt still has  Phlegm when pt coughs;prod cough still has greenish grey phlegm; No wheezing or SOB and no fever. Pt said symptoms are getting better by 60% but just taking along time to completely go away.pt wants Dr Alla German opinion if should take OTC med or just wait it out. Pt request cb after Dr Silvio Pate reviews.  [facility]

## 2018-09-07 NOTE — Telephone Encounter (Signed)
Spoke to pt. He may go get some OTC cough syrup.

## 2018-09-07 NOTE — Telephone Encounter (Signed)
There is really no OTC med to take for this (other than a DM cough suppressant). If he starts worsening ( increased sputum, fever, etc), I would try an antibiotic. Otherwise, he probably just needs to wait it out

## 2018-09-12 ENCOUNTER — Other Ambulatory Visit: Payer: Self-pay | Admitting: Internal Medicine

## 2018-09-12 NOTE — Telephone Encounter (Signed)
Last filled 08-12-18 #60 Last OV 06-30-18 No Future OV [facility]

## 2018-09-20 ENCOUNTER — Telehealth: Payer: Self-pay

## 2018-09-20 NOTE — Telephone Encounter (Signed)
Patient called in to schedule an appointment today or tomorrow with his PCP.  He reports having a syncopal event (passed out) after eating at restaurant today.  EMS arrived and assessed patient: BP 128/61, BS 100, EKG WNL.    He states that after eating when he went to stand up, he felt strange and sick to his stomach suddenly and then blacked out.  Denies hitting head and no other injury noted. Denies any dizziness.   After EMS evaluated they did not feel that he needed to go to ER and told him to follow up with PCP.  He is asymptomatic at the present and states this happens to him about every 23mths and Dr. Silvio Pate is aware.  Patient does have parkinson's and is on medication management for disease. Possible that he suffered an orthostatic event and should be followed up by provider.  Patient aware that Dr. Silvio Pate is out of the office this week and we have no openings today with our providers.  Patient requests to see Dr. Lorelei Pont.  He was informed that if symptoms returned or any concerns that he should go to the ER in the interim until appointment with Dr. Lorelei Pont tomorrow at 320pm.  He is aware to change position slowly, increase fluid intake and keep/use his assistive device (walker) by him at all times.      Will forward to Dr. Lorelei Pont as Juluis Rainier.

## 2018-09-20 NOTE — Progress Notes (Signed)
Dr. Frederico Hamman T. Lianny Molter, MD, [facility] [place], [address] Phone: [phone] Fax: [phone]  09/21/2018  Patient: Casey Tucker, MRN: [id number], DOB: [birth year], 78 y.o.  Primary Physician:  [name], MD   Chief Complaint  Patient presents with  . Loss of Consciousness   Subjective:   Casey Tucker is a 78 y.o. very pleasant male patient who presents with the following:  78 yo male patient well-known to me for 40+ years with Parkinson's who presents after falling and "blacking out" at home.   Got quiet at a restrauant and was not really moving according to weife and when stood up and completely blanked and did not fall, but a gentleman in [employer] caught him.  EMS did come and evaluate.   Has AS and murmur - it appears to have last been imaged in 2010 by echocardiogram. He does not appear to have had a formal cardiology evaluation.  He thinks he may have had a cardiac MRI 10 years ago, but I can find no documentation of this anywhere in the medical record.   He is currently anemic.  Lab Results  Component Value Date   WBC 5.0 06/24/2018   HGB 9.6 (L) 06/24/2018   HCT 27.3 (L) 06/24/2018   MCV 116.8 (H) 06/24/2018   PLT 269.0 06/24/2018     Past Medical History, Surgical History, Social History, Family History, Problem List, Medications, and Allergies have been reviewed and updated if relevant.  Patient Active Problem List   Diagnosis Date Noted  . Lobar pneumonia ([place]) 06/25/2018  . Urinary frequency 01/21/2018  . Advanced directives, counseling/discussion 04/20/2014  . Routine general medical examination at a health care facility 03/16/2012  . 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 Osteoarthrosis, hip 08/12/2011  . Parkinson disease ([place]) 01/29/2011  . Aortic stenosis 01/01/2009  . GILBERT'S SYNDROME 12/09/2007  . GERD 12/09/2007  . RENAL CALCULUS, HX OF 12/09/2007    Past  Medical History:  Diagnosis Date  . Arthritis    left hip   . GERD (gastroesophageal reflux disease) Pre 2002   Mild  . History of kidney stones   . 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 Parkinson disease ([place]) 2004   Slowly progressive  . Ruptured appendix teens    Past Surgical History:  Procedure Laterality Date  . APPENDECTOMY    . CATARACT EXTRACTION W/ INTRAOCULAR LENS  IMPLANT, BILATERAL  2017  . DEEP BRAIN STIMULATOR PLACEMENT  8/14   L STN  . DOPPLER ECHOCARDIOGRAPHY  01/10/2009   LV NML Mild LVH EF 60-65% aortic sclerosis w/0 stenosis   . JOINT REPLACEMENT     left hip replacement 08/14/11/   . Neuro evaluation  12/19/2004   [name], benign tremor  . SUBTHALAMIC STIMULATOR BATTERY REPLACEMENT Left 03/25/2017   Procedure: Deep Brain Stimulator battery replacement;  Surgeon: Erline Levine, MD;  Location: [facility];  Service: Neurosurgery;  Laterality: Left;  left  . TONSILLECTOMY    . TOTAL HIP REVISION  08/27/2011   Procedure: TOTAL HIP REVISION;  Surgeon: Mauri Pole, MD;  Location: [facility];  Service: Orthopedics;  Laterality: Left;    Social History   Socioeconomic History  . Marital status: Married    Spouse name: Not on file  . Number of children: 2  . Years of education: Not on  file  . Highest education level: Not on file  Occupational History  . 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 Financial resource strain: Not on file  . Food insecurity:    Worry: Not on file    Inability: Not on file  . Transportation needs:    Medical: Not on file    Non-medical: Not on file  Tobacco Use  . Smoking status: Never Smoker  . Smokeless tobacco: Never Used  Substance and Sexual Activity  . Alcohol use: Yes    Alcohol/week: 7.0 standard drinks    Types: 7 Standard drinks or equivalent per week    Comment: occassionally  . Drug use: No  . Sexual  activity: Yes  Lifestyle  . Physical activity:    Days per week: Not on file    Minutes per session: Not on file  . Stress: Not on file  Relationships  . Social connections:    Talks on phone: Not on file    Gets together: Not on file    Attends religious service: Not on file    Active member of club or organization: Not on file    Attends meetings of clubs or organizations: Not on file    Relationship status: Not on file  . Intimate partner violence:    Fear of current or ex partner: Not on file    Emotionally abused: Not on file    Physically abused: Not on file    Forced sexual activity: Not on file  Other Topics Concern  . Not on file  Social History Narrative   Married, lives with wife   2 daughters      Has living will   Wife has health care POA---then daughters   Would still accept CPR--but no prolonged artificial means (ventilator or tube feeds)    Family History  Problem Relation Age of Onset  . Arthritis Mother        knee replacement  . COPD Father        emphysema, smoker  . Heart disease Father        CHF  . Alcohol abuse Paternal Uncle     Allergies  Allergen Reactions  . Amoxicillin   . Celebrex [Celecoxib]   . Demerol Other (See Comments)    Hallucinations   . Penicillins     REACTION: RASH    Medication list reviewed and updated in full in [facility].   GEN: No acute illnesses, no fevers, chills. GI: No n/v/d, eating normally Pulm: No SOB Interactive and getting along well at home.  Otherwise, ROS is as per the HPI.  Objective:   BP (!) 122/51   Pulse 65   Temp 98.4 F (36.9 C) (Oral)   Ht 5' 10.5" (1.791 m)   Wt 159 lb 12 oz (72.5 kg)   SpO2 98%   BMI 22.60 kg/m    Orthostatic VS for the past 24 hrs:  BP- Lying Pulse- Lying BP- Sitting Pulse- Sitting BP- Standing at 0 minutes Pulse- Standing at 0 minutes  09/21/18 1521 131/64 66 131/61 70 118/59 67      GEN: WDWN, NAD, Non-toxic, A & O x 3 HEENT: Atraumatic,  Normocephalic. Neck supple. No masses, No LAD. Ears and Nose: No external deformity. CV: RRR, No 3/6 SEM. No JVD. No thrill. No extra heart sounds. PULM: CTA B, no wheezes, crackles, rhonchi. No retractions. No resp. distress. No accessory muscle use. EXTR: No c/c/e  NEURO Normal gait. Mild trouble getting up from a seated position and occaisional tremors  PSYCH: Normally interactive. Conversant. Not depressed or anxious appearing.  Calm demeanor.   Laboratory and Imaging Data: Lab Results  Component Value Date   WBC 5.0 06/24/2018   HGB 9.6 (L) 06/24/2018   HCT 27.3 (L) 06/24/2018   PLT 269.0 06/24/2018   GLUCOSE 105 (H) 06/24/2018   CHOL 166 03/16/2012   TRIG 30.0 03/16/2012   HDL 62.10 03/16/2012   LDLCALC 98 03/16/2012   ALT 2 06/24/2018   AST 11 06/24/2018   NA 137 06/24/2018   K 4.0 06/24/2018   CL 102 06/24/2018   CREATININE 0.87 06/24/2018   BUN 21 06/24/2018   CO2 27 06/24/2018   TSH 2.82 03/16/2012   PSA 0.73 01/28/2011   INR 1.09 08/27/2011     Assessment and Plan:   Syncope, unspecified syncope type - Plan: EKG 12-Lead, ECHOCARDIOGRAM COMPLETE  Parkinson disease ([place])  Aortic valve stenosis, etiology of cardiac valve disease unspecified - Plan: ECHOCARDIOGRAM COMPLETE  Anemia due to unknown mechanism - Plan: Vitamin B12, IBC + Ferritin, Folate  B12 deficiency - Plan: Vitamin B12  Folate deficiency - Plan: Folate  He is not orthostatic.  His hemoglobin was 9 on his last check and this may be playing a role.  On exam, sounds to have a fairly significant murmur, most likely aortic stenosis.  We will recheck an echocardiogram to assess valvular function and check for outflow obstruction.  Follow-up: depends on results  Orders Placed This Encounter  Procedures  . Vitamin B12  . IBC + Ferritin  . Folate  . EKG 12-Lead  . ECHOCARDIOGRAM COMPLETE    Signed,  Frederico Hamman T. Willadean Guyton, MD   Outpatient Encounter Medications as of 09/21/2018  Medication Sig   . amantadine (SYMMETREL) 100 MG capsule TAKE ONE CAPSULE THREE TIMES A DAY  . carbidopa-levodopa (SINEMET IR) 25-100 MG tablet 2 at 7:30am, 1 at 10am, 1 at 1pm, 1 at 4pm, 1 at 7pm  . clonazePAM (KLONOPIN) 0.5 MG tablet TAKE 1 TO 2 TABLETS BY MOUTH AT BEDTIME AS NEEDED  . cyclobenzaprine (FLEXERIL) 10 MG tablet Take 2 tablets (20 mg total) by mouth at bedtime.  . entacapone (COMTAN) 200 MG tablet Take 1 tablet (200 mg total) by mouth 5 (five) times daily. Take with Levodopa  . finasteride (PROSCAR) 5 MG tablet Take 1 tablet (5 mg total) by mouth daily.  . tamsulosin (FLOMAX) 0.4 MG CAPS capsule Take 1 capsule (0.4 mg total) by mouth daily after supper.   No facility-administered encounter medications on file as of 09/21/2018.

## 2018-09-21 ENCOUNTER — Encounter: Payer: Self-pay | Admitting: Family Medicine

## 2018-09-21 ENCOUNTER — Encounter: Payer: Self-pay | Admitting: *Deleted

## 2018-09-21 ENCOUNTER — Ambulatory Visit (INDEPENDENT_AMBULATORY_CARE_PROVIDER_SITE_OTHER): Payer: Medicare Other | Admitting: Family Medicine

## 2018-09-21 VITALS — BP 122/51 | HR 65 | Temp 98.4°F | Ht 70.5 in | Wt 159.8 lb

## 2018-09-21 DIAGNOSIS — R4020 Unspecified coma: Secondary | ICD-10-CM | POA: Diagnosis not present

## 2018-09-21 DIAGNOSIS — I350 Nonrheumatic aortic (valve) stenosis: Secondary | ICD-10-CM | POA: Diagnosis not present

## 2018-09-21 DIAGNOSIS — E538 Deficiency of other specified B group vitamins: Secondary | ICD-10-CM

## 2018-09-21 DIAGNOSIS — G20A1 Parkinson's disease without dyskinesia, without mention of fluctuations: Secondary | ICD-10-CM

## 2018-09-21 DIAGNOSIS — D649 Anemia, unspecified: Secondary | ICD-10-CM | POA: Diagnosis not present

## 2018-09-21 DIAGNOSIS — W19XXXA Unspecified fall, initial encounter: Secondary | ICD-10-CM | POA: Diagnosis not present

## 2018-09-21 DIAGNOSIS — G20 Parkinson's disease: Secondary | ICD-10-CM | POA: Diagnosis not present

## 2018-09-21 DIAGNOSIS — R55 Syncope and collapse: Secondary | ICD-10-CM

## 2018-09-21 DIAGNOSIS — R110 Nausea: Secondary | ICD-10-CM | POA: Diagnosis not present

## 2018-09-21 NOTE — Patient Instructions (Signed)
REFERRALS TO SPECIALISTS, SPECIAL TESTS (MRI, CT, ULTRASOUNDS)  [facility] or  Anastasiya will help you. ASK CHECK-IN FOR HELP.  Specialist appointment times vary a great deal, based on their schedule / openings. -- Some specialists have very long wait times. (Example. Dermatology)    

## 2018-09-22 LAB — IBC + FERRITIN
Ferritin: 229 ng/mL (ref 22.0–322.0)
Iron: 189 ug/dL — ABNORMAL HIGH (ref 42–165)
SATURATION RATIOS: 79.9 % — AB (ref 20.0–50.0)
Transferrin: 169 mg/dL — ABNORMAL LOW (ref 212.0–360.0)

## 2018-09-22 LAB — FOLATE: Folate: 19.9 ng/mL (ref >5.9)

## 2018-09-22 LAB — VITAMIN B12: Vitamin B-12: 613 pg/mL (ref 211–911)

## 2018-09-26 NOTE — Telephone Encounter (Signed)
Was seen by Dr Lorelei Pont

## 2018-10-10 ENCOUNTER — Telehealth: Payer: Self-pay

## 2018-10-10 NOTE — Telephone Encounter (Signed)
I do not think he is safe to drive---both because of the fainting and his Parkinson's. The only way I would approve him driving again is if he underwent a thorough Occupational Health driving assessment.  If he wants to try this, I can probably set it up through the therapy at [facility] (but I don't think he will pass)

## 2018-10-10 NOTE — Telephone Encounter (Addendum)
Pt left v/m requesting cb from Dr Lorelei Pont; I spoke with pt and he said he wants to get his drivers license back again and needs a note that he is OK to drive around town. Pt was seen by Dr Lorelei Pont on 09/21/18 with syncope and blacking out at home. Pt said since pt was OK can Dr Lorelei Pont write a letter stating pt is OK to drive. I asked pt if he had had the echocardiogram yet and pt said no not yet; scheduled for echo on 10/25/18. I asked when pt stopped driving and Mr Deleo said he and his wife were having a disagreement right now and we need to stop talking now and call ended. FYI to Dr Lorelei Pont.  I spoke with Dr Lorelei Pont and since he knows pt personally request note sent to Dr Silvio Pate. Dr Lorelei Pont said probably a parkinsons question. FYI to Dr Silvio Pate.

## 2018-10-10 NOTE — Telephone Encounter (Signed)
I spoke with Dr Silvio Pate and he said due to parkinsons it is not a good idea for pt to be driving; pt should not be driving in general. Pt notified as instructed and pt voiced understanding and said "He agrees." FYI to Dr Silvio Pate.

## 2018-10-17 ENCOUNTER — Telehealth: Payer: Self-pay | Admitting: *Deleted

## 2018-10-17 ENCOUNTER — Telehealth: Payer: Self-pay

## 2018-10-17 NOTE — Telephone Encounter (Signed)
[place] Night - Client Nonclinical Telephone Record [facility] Night - Client Client Site [place] - Night Physician Owens Loffler - MD Contact Type Call Who Is Calling Patient / Member / Family / Caregiver Caller Name Raheel Kunkle Caller Phone Number [phone] Patient Name Ansel Ferrall Patient DOB April 03, 1941 Call Type Message Only Information Provided Reason for Call Request to Reschedule Office Appointment Initial Comment Caller says that he is having an electro cardiogram at the hospital on Tuesday but wants to change it until the coronavirus crisis is over. Additional Comment Declined triage Call Closed By: Norton Blizzard Transaction Date/Time: 10/16/2018 6:05:59 PM (ET)

## 2018-10-17 NOTE — Telephone Encounter (Signed)
Rescheduled 2D Echocardiogram, letter mailed to patient with new date and time.

## 2018-10-17 NOTE — Telephone Encounter (Signed)
Patient called stating that he is scheduled to have an EKG done at [facility] and wants to cancel it at this time because of the coronavirus. Patient stated that he does not have the number to call and cancel it and requested that the person that scheduled it contact him.

## 2018-10-17 NOTE — Telephone Encounter (Signed)
Request was addressed on 10/17/18 by Referral Department.

## 2018-10-18 ENCOUNTER — Other Ambulatory Visit: Payer: Self-pay | Admitting: Internal Medicine

## 2018-10-18 NOTE — Telephone Encounter (Signed)
Last filled 09-12-18 #60 Last Ov Acute 09-21-18 Next OV 07-04-19 [facility]

## 2018-10-25 ENCOUNTER — Other Ambulatory Visit: Payer: Medicare Other

## 2018-11-01 ENCOUNTER — Telehealth: Payer: Self-pay | Admitting: Internal Medicine

## 2018-11-01 NOTE — Telephone Encounter (Signed)
Patient is scheduled for 2 D Echo on 12/15/2018 at [facility] in [place] office. He has already rescheduled this once and is calling to reschedule again down the road due to Covid.  I told him I would check with you to see what advice you can give him. Please advise.

## 2018-11-02 NOTE — Telephone Encounter (Signed)
Called patient and he will call me back 1 week before his appt.

## 2018-11-02 NOTE — Telephone Encounter (Signed)
Just let him know he should wait before rescheduling this It may be okay to proceed with this test on that date---since it is 6 weeks away. If it is still advisable to hold off on the test, we can reschedule it when it is about a week away-----just have him hold off for now

## 2018-11-03 ENCOUNTER — Encounter: Payer: Medicare Other | Admitting: Psychology

## 2018-11-03 ENCOUNTER — Encounter

## 2018-11-04 ENCOUNTER — Other Ambulatory Visit: Payer: Self-pay | Admitting: Neurology

## 2018-11-04 MED ORDER — CARBIDOPA-LEVODOPA 25-100 MG PO TABS: ORAL_TABLET | ORAL | 1 refills | Status: DC

## 2018-11-11 ENCOUNTER — Other Ambulatory Visit: Payer: Self-pay | Admitting: *Deleted

## 2018-11-11 MED ORDER — CYCLOBENZAPRINE HCL 10 MG PO TABS: 20.0000 mg | ORAL_TABLET | Freq: Every day | ORAL | 5 refills | Status: DC

## 2018-11-15 ENCOUNTER — Telehealth: Payer: Self-pay | Admitting: Urology

## 2018-11-15 ENCOUNTER — Telehealth: Payer: Self-pay

## 2018-11-15 DIAGNOSIS — R351 Nocturia: Secondary | ICD-10-CM

## 2018-11-15 NOTE — Telephone Encounter (Signed)
Pt called and taking tamsulosin 0.4 mg one po daily and finasteride 5 mg po daily. Pt gets up 5 x a night to urinate. Pt wants to know Dr Everardo Beals opinion if anything else could be done or will this be as good as it will get. Pt does empty his bladder. Pt last seen urology 08/31/18.pt has left message for urology also to get there opinion if anything else can be done. FYI to Dr Silvio Pate.

## 2018-11-15 NOTE — Telephone Encounter (Signed)
Pt called and taking tamsulosin 0.4 mg one po daily and finasteride 5 mg po daily. Pt gets up 5 x a night to urinate. Pt wants to know Dr Lyndal Rainbow opinion if anything else could be done or will this be as good as it will get. Pt does empty his bladder. Pt last seen urology 08/31/18. He wants to know if anything else can be done?

## 2018-11-16 MED ORDER — DESMOPRESSIN ACETATE 0.2 MG PO TABS: 0.2000 mg | ORAL_TABLET | Freq: Every day | ORAL | 3 refills | Status: DC

## 2018-11-16 MED ORDER — SOLIFENACIN SUCCINATE 5 MG PO TABS: 5.0000 mg | ORAL_TABLET | Freq: Every day | ORAL | 2 refills | Status: DC

## 2018-11-16 NOTE — Telephone Encounter (Signed)
Pt called back to see if there was further instruction. Pt notified as instructed. Pt voiced understanding. Pt wanted to wait on scheduling labs and will cb to schedule. Pt did not want to come in to office. I advised for lab test the labs are drawn with pt in the car in the back parking lot. Pt said OK but will cb to schedule; he wants to think about it.FYI to [facility] CMA.

## 2018-11-16 NOTE — Addendum Note (Signed)
Addended by: [name] on: 11/16/2018 08:44 AM   Modules accepted: Orders

## 2018-11-16 NOTE — Telephone Encounter (Signed)
Yes, try an overactive bladder medicine. Let him know I sent solifenacin to his pharmacy. Have him follow-up with [facility] and consider other OAB med or  PTNS.

## 2018-11-16 NOTE — Telephone Encounter (Signed)
There is another medicine called DDAVP (desmopressin) that we could try at bedtime if he wants. I am not sure how expensive it would be

## 2018-11-16 NOTE — Telephone Encounter (Signed)
Spoke to pt. He wants to try the DDAVP. Asked to send it to [facility]. Asking if he continues the other medications, also.

## 2018-11-16 NOTE — Telephone Encounter (Signed)
Called pt informed him of the information below, pt gave verbal understanding.

## 2018-11-16 NOTE — Telephone Encounter (Signed)
Please set him up for blood work in 2 weeks or so (to check sodium level). If it doesn't help within the first prescription, he shouldn't refill it

## 2018-11-23 ENCOUNTER — Other Ambulatory Visit: Payer: Self-pay

## 2018-11-23 MED ORDER — CLONAZEPAM 0.5 MG PO TABS: 0.5000 mg | ORAL_TABLET | Freq: Every evening | ORAL | 0 refills | Status: DC | PRN

## 2018-11-23 NOTE — Telephone Encounter (Signed)
Last filled 10-19-18 Last OV 06-30-18 Next OV 07-04-19 [facility]

## 2018-11-29 ENCOUNTER — Telehealth: Payer: Self-pay

## 2018-11-29 ENCOUNTER — Telehealth: Payer: Self-pay | Admitting: Neurology

## 2018-11-29 NOTE — Telephone Encounter (Signed)
Patient called regarding him having severe constipation. Please Call. Thanks

## 2018-11-29 NOTE — Telephone Encounter (Signed)
Patient left message with after hours service returning your call. Thanks

## 2018-11-29 NOTE — Telephone Encounter (Signed)
There is a medication that may help---amitiza. This will cause nausea at times---so I am not in a rush to try it I also don't know how expensive it would be Find out if he wants me to send it to his pharmacy to give it a try\  If he has not taken the miralax and senna twice a day regularly---he probably just needs to give it more time (he has to take it every day, not just when he starts having problems)

## 2018-11-29 NOTE — Telephone Encounter (Signed)
Called patient back and left message for him to call me.

## 2018-11-29 NOTE — Telephone Encounter (Signed)
Tried to call pt, line was busy

## 2018-11-29 NOTE — Telephone Encounter (Signed)
Does he have a gastroenterologist?  If so, have them call their office.  If not, refer for constipation associated with Parkinsons disease.

## 2018-11-29 NOTE — Telephone Encounter (Signed)
Tried to call pt, no answer, just rang. No VM

## 2018-11-29 NOTE — Telephone Encounter (Signed)
Called patient back.  No voicemail came on to leave message.

## 2018-11-29 NOTE — Telephone Encounter (Signed)
Pt left v/m having a lot of problems with laxative and request cb. I spoke with pt; pt got senna take 2-4 tabs per day  and miralax twice a day. Not effective pt was still constipated. Pt wants to know what else he can do. Pt took 6 tabs of senna yesterday and dose of epsom salt. Last BM was 11/28/18 got good amt of hard constipated stool out. [facility]. Pt request cb.

## 2018-11-30 NOTE — Telephone Encounter (Signed)
Spoke to pt and wife. He will take the Miralax and Senna twice a day until regular and will take the miralax daily and senna twice a day after that.

## 2018-11-30 NOTE — Telephone Encounter (Signed)
Copied from [place] [id number]. Topic: General - Other >> Nov 29, 2018  4:40 PM Nils Flack wrote: Reason for CRM: [phone] Pt called - he would like to no what to take for constipation  Please call back

## 2018-11-30 NOTE — Telephone Encounter (Signed)
I called patient and he said that he has a doctor that he is going to try to get an appointment with.  If he can't, he will call me and let me know if he would like for me to refer him to someone.

## 2018-12-01 ENCOUNTER — Telehealth: Payer: Self-pay

## 2018-12-01 NOTE — Telephone Encounter (Signed)
I sent the medication to the pharmacy on 4/29. I gather he didn't pick it up

## 2018-12-01 NOTE — Telephone Encounter (Signed)
Pt left v/m that he wants to try the med that would reduce the amt of urine he is producing at night and request cb when med sent to pharmacy.

## 2018-12-01 NOTE — Telephone Encounter (Signed)
Spoke to pt's wife. She will check with the pharmacy.

## 2018-12-02 ENCOUNTER — Telehealth: Payer: Self-pay | Admitting: Neurology

## 2018-12-02 NOTE — Telephone Encounter (Signed)
unable to send patient my chart message he isn't active Will call to make him aware of provider response concerning changing Rx

## 2018-12-02 NOTE — Telephone Encounter (Signed)
Called spoke with patient he is aware and understands

## 2018-12-02 NOTE — Telephone Encounter (Signed)
-----   Message from Ranae Plumber, [place] sent at 12/02/2018 11:27 AM EDT ----- Regarding: RE: Re Authorization/ [place] patient to confirm dose and direction on how often he is using this for PA. Pt states he would like to try something else. He doesn't see any benefits to using .  Please advise   [facility] ----- Message ----- From: Ludwig Clarks, DO Sent: 12/02/2018  10:18 AM EDT To: Ranae Plumber, CMA Subject: FW: Re Authorization/ [facility]                  Can you assist? ----- Message ----- From: Elenora Fender Sent: 11/30/2018  11:30 AM EDT To: Aileen Pilot Driver, LPN Subject: Re Authorization/ [place] Degroot called from [facility] [employer] needing to have patient's medication Inbrija re authorized. Please Call [phone].   Thanks  [employer]

## 2018-12-02 NOTE — Telephone Encounter (Signed)
There really isn't anything that he hasn't already tried.  Pt uses my chart.  Feel free to just send him my chart message and let him know that.  Also, make sure that these are in as phone messages (not staff) so that are documented.  I copied into phone message.  Thanks!

## 2018-12-02 NOTE — Telephone Encounter (Signed)
Patient called asking about the medication that was suppose to be sent to the pharmacy to help with his urine problem. Advised patient per Dr. Alla German message on 12/01/18 and he verbalized understanding. Advised patient that if the pharmacy does not have the script to have them call the office.

## 2018-12-02 NOTE — Telephone Encounter (Signed)
Called spoke with [facility] for re authorization a fax with approval will be sent to office within 72 hours or less.

## 2018-12-05 ENCOUNTER — Telehealth: Payer: Self-pay

## 2018-12-05 NOTE — Telephone Encounter (Signed)
Copied from [place] [id number]. Topic: General - Other >> Dec 02, 2018  4:26 PM Nils Flack wrote: Reason for CRM: wife called, she is asking for rx for desmopressin (DDAVP) 0.2 MG tablet [facility]  Call back if questions [phone]

## 2018-12-05 NOTE — Telephone Encounter (Signed)
I spoke with Casey Tucker at [facility] and DDAVP was picked up on 11/16/18 but pharmacy could owe pt and additional qty due to not keeping this med in stock. Mrs Spraggins cannot find bottle now and will call Casey Tucker to see when can pick up DDAVP.nothing further needed.

## 2018-12-07 ENCOUNTER — Other Ambulatory Visit: Payer: Self-pay | Admitting: Internal Medicine

## 2018-12-08 ENCOUNTER — Telehealth: Payer: Self-pay | Admitting: *Deleted

## 2018-12-08 NOTE — Telephone Encounter (Signed)
-----   Message from Elenora Fender sent at 11/30/2018 11:30 AM EDT ----- Regarding: Re Authorization/ Inbrija Hi Osvaldo [place] Degroot called from [place] needing to have patient's medication Inbrija re authorized. Please Call [phone].   Thanks  [employer]

## 2018-12-08 NOTE — Telephone Encounter (Signed)
received fax from [facility] with approval for patient Inbrija 42 mg  Approved from 11/02/18-12/02/2019

## 2018-12-08 NOTE — Telephone Encounter (Signed)
Never received approval letter called back today spoke with Casey Tucker she is refaxing letter to [phone]

## 2018-12-08 NOTE — Telephone Encounter (Signed)
[employer] has spoken with this patient and has started PA.

## 2018-12-09 ENCOUNTER — Other Ambulatory Visit: Payer: Medicare Other

## 2018-12-15 ENCOUNTER — Other Ambulatory Visit: Payer: Medicare Other

## 2018-12-15 ENCOUNTER — Telehealth: Payer: Self-pay | Admitting: *Deleted

## 2018-12-15 NOTE — Telephone Encounter (Signed)
The patient came in for his echo which had been cancelled with the note that he would call back to reschedule. He was informed that the schedule was full today but we could try and call the hospital to see if they could see him. The patient refused to reschedule stating that he would not have the echo at all. He was again asked if we could reschedule but he refused. He has been advised to call back if he changes his mind.

## 2018-12-20 ENCOUNTER — Other Ambulatory Visit: Payer: Self-pay | Admitting: Internal Medicine

## 2018-12-21 ENCOUNTER — Telehealth: Payer: Self-pay | Admitting: Internal Medicine

## 2018-12-21 NOTE — Telephone Encounter (Signed)
Last filled 11-23-18 #60 Last OV 09-21-18 Next OV 07-04-19 [facility]

## 2018-12-21 NOTE — Telephone Encounter (Signed)
Please try to coordinate him getting the echocardiogram in a reasonable safe manner

## 2018-12-21 NOTE — Telephone Encounter (Signed)
Spoke with Mrs Billey she will call [facility] to get the Echo rescheduled.

## 2018-12-21 NOTE — Telephone Encounter (Signed)
Best number [phone]  Pt stated he went to have echocardiogram wed 5/28 and he stated they didn't have record of him having appointment.  He stated he wanted to wait until covid19 is over

## 2019-01-03 ENCOUNTER — Ambulatory Visit: Payer: Medicare Other | Admitting: Neurology

## 2019-01-06 ENCOUNTER — Other Ambulatory Visit: Payer: Self-pay | Admitting: Internal Medicine

## 2019-01-09 ENCOUNTER — Other Ambulatory Visit: Payer: Self-pay

## 2019-01-09 ENCOUNTER — Telehealth: Payer: Self-pay

## 2019-01-09 DIAGNOSIS — N138 Other obstructive and reflux uropathy: Secondary | ICD-10-CM

## 2019-01-09 DIAGNOSIS — N401 Enlarged prostate with lower urinary tract symptoms: Secondary | ICD-10-CM

## 2019-01-09 DIAGNOSIS — R350 Frequency of micturition: Secondary | ICD-10-CM

## 2019-01-09 MED ORDER — ENTACAPONE 200 MG PO TABS: 200.0000 mg | ORAL_TABLET | Freq: Every day | ORAL | 1 refills | Status: DC

## 2019-01-09 MED ORDER — SOLIFENACIN SUCCINATE 5 MG PO TABS: 5.0000 mg | ORAL_TABLET | Freq: Every day | ORAL | 11 refills | Status: DC

## 2019-01-09 NOTE — Telephone Encounter (Signed)
Requested Prescriptions   Pending Prescriptions Disp Refills  . entacapone (COMTAN) 200 MG tablet 450 tablet 1    Sig: Take 1 tablet (200 mg total) by mouth 5 (five) times daily. Take with Levodopa   Rx last filled:03/15/18 #450 1 refills  Pt last seen: 08/03/18  Follow up appt scheduled: 03/30/19

## 2019-01-11 ENCOUNTER — Other Ambulatory Visit: Payer: Self-pay

## 2019-01-11 MED ORDER — CARBIDOPA-LEVODOPA 25-100 MG PO TABS: ORAL_TABLET | ORAL | 1 refills | Status: DC

## 2019-01-11 NOTE — Telephone Encounter (Signed)
Requested Prescriptions   Pending Prescriptions Disp Refills  . carbidopa-levodopa (SINEMET IR) 25-100 MG tablet 540 tablet 1    Sig: 2 at 7:30am, 1 at 10am, 1 at 1pm, 1 at 4pm, 1 at 7pm   Rx last filled:11/04/18 #540 1 refills  Pt last seen: 08/03/18  Follow up appt scheduled:03/30/19

## 2019-01-12 ENCOUNTER — Other Ambulatory Visit: Payer: Medicare Other

## 2019-01-25 ENCOUNTER — Other Ambulatory Visit: Payer: Self-pay | Admitting: Internal Medicine

## 2019-01-25 NOTE — Telephone Encounter (Signed)
Last filled 12-21-18 #60 Last OV 09-21-18 Next OV 07-04-19 [facility]

## 2019-02-08 DIAGNOSIS — H35371 Puckering of macula, right eye: Secondary | ICD-10-CM | POA: Diagnosis not present

## 2019-02-08 DIAGNOSIS — Z961 Presence of intraocular lens: Secondary | ICD-10-CM | POA: Diagnosis not present

## 2019-02-08 DIAGNOSIS — H353131 Nonexudative age-related macular degeneration, bilateral, early dry stage: Secondary | ICD-10-CM | POA: Diagnosis not present

## 2019-02-14 ENCOUNTER — Telehealth: Payer: Self-pay | Admitting: *Deleted

## 2019-02-14 NOTE — Telephone Encounter (Signed)
Mr. Warga notified as instructed by telephone.

## 2019-02-14 NOTE — Telephone Encounter (Signed)
Patient called stating that he is urinating a lot during the day and loses a pound and a half some days. Patient stated that Dr. Silvio Pate put him on Proscar for his overactive bladder. Patient stated that he was later seen by urologist Dr. Junious Silk which he put him on Vesicare and Flomax. Patient stated that his sister is a Software engineer and she told him that he is taking too much medication for his bladder. Patient wants to know what Dr.Letvak thinks?  Patient stated that he does not have to go back to the urologist any time soon, he thinks a year from his last visit. Patient was advised that Dr. Silvio Pate is out of the office this week, so patient requested that this message go back to Dr. Lorelei Pont because he has seen him before and knows him well. Patient stated that if Dr. Lorelei Pont thinks that he should call his urologist again he will. Please advise.

## 2019-02-14 NOTE — Telephone Encounter (Signed)
I think that going back to urology is the best idea.  He can probably call and make a direct appointment, but if needed I can make a referral.

## 2019-02-15 ENCOUNTER — Telehealth: Payer: Self-pay | Admitting: Urology

## 2019-02-15 ENCOUNTER — Telehealth: Payer: Self-pay | Admitting: Neurology

## 2019-02-15 NOTE — Telephone Encounter (Signed)
Pt called and states that he is getting up all through the night having to urinate. He states that he does not want to come into the office right now due to [place], he would like to see if something can be called in to help him. Please advise.

## 2019-02-15 NOTE — Telephone Encounter (Signed)
Patient called in wanting Dr. Carles Collet to know that he has lost about 2-3 pounds. He's been on a medication for having trouble urinating and he said its caused him to lose weight and he wanted her to know. Thanks!

## 2019-02-15 NOTE — Telephone Encounter (Signed)
Noted FYI below

## 2019-02-16 NOTE — Telephone Encounter (Signed)
Would you be ok with a virtual visit to be scheduled or sending in a medication?

## 2019-02-16 NOTE — Telephone Encounter (Signed)
Noted but he should probably let the doctor know that is prescribing the medication that seems to be bothering him

## 2019-02-16 NOTE — Telephone Encounter (Signed)
Please schedule a virtual visit with Dr. Junious Silk next time he is in the office thanks

## 2019-02-16 NOTE — Telephone Encounter (Signed)
Spoke with patient he states that the provider that prescribes medication is aware of this.

## 2019-02-17 NOTE — Telephone Encounter (Signed)
App scheduled for a virtual/telephone app   [name]

## 2019-02-20 ENCOUNTER — Telehealth: Payer: Self-pay

## 2019-02-20 ENCOUNTER — Telehealth: Payer: Self-pay | Admitting: Neurology

## 2019-02-20 NOTE — Telephone Encounter (Signed)
Received voice mail requesting call back for additional information on patient Appeal for Cyclobenzaprine 10 mg  Case id# [id number]

## 2019-02-20 NOTE — Telephone Encounter (Signed)
Patient left vm for nurse about medication. Trying to get it approved through the [employer]. I couldn't understand the name of the meds. Thanks!

## 2019-02-21 NOTE — Telephone Encounter (Signed)
Called spoke with [name] at [facility] regarding additional questions. A determination will be fax to office on 02/23/19

## 2019-02-21 NOTE — Telephone Encounter (Signed)
See other note [employer] called requesting no information about patient cyclobenzaprine 10 mg yesterday. Denial  Doing appeal

## 2019-02-23 ENCOUNTER — Telehealth: Payer: Self-pay

## 2019-02-23 NOTE — Telephone Encounter (Signed)
Spoke with patient yes taken at night. Also aware of provider response about weaning off, no weaning

## 2019-02-23 NOTE — Telephone Encounter (Signed)
Called patient to make him aware that he will need to pay out of pocket for Cyclobenzaprine 10 mg.  He agrees and will check with pharmacy about out of pocket cost.  He would like to know if he decides to come off how would he wean off of this.?

## 2019-02-23 NOTE — Telephone Encounter (Signed)
Insurance denied appeal for medication.  No weaning.  He is just taking it at night, as far as I know.  Confirm that is the case.

## 2019-02-23 NOTE — Progress Notes (Signed)
Received DENIAL from [place] regarding patient Cyclobezaprine 10 mg  Per provider patient would need to patient out of pocket if he wants to continue medication.  No redetermination will be completed by provider.   Patient ID# [id number] Plan Name: [place] ID (PBP Code) 287  Case ID: [id number] SEE SCAN DOCUMENTS

## 2019-03-01 ENCOUNTER — Telehealth (INDEPENDENT_AMBULATORY_CARE_PROVIDER_SITE_OTHER): Payer: Medicare Other | Admitting: Urology

## 2019-03-01 ENCOUNTER — Other Ambulatory Visit: Payer: Self-pay | Admitting: Internal Medicine

## 2019-03-01 ENCOUNTER — Other Ambulatory Visit: Payer: Self-pay

## 2019-03-01 ENCOUNTER — Telehealth: Payer: Self-pay | Admitting: Urology

## 2019-03-01 DIAGNOSIS — N138 Other obstructive and reflux uropathy: Secondary | ICD-10-CM

## 2019-03-01 DIAGNOSIS — R350 Frequency of micturition: Secondary | ICD-10-CM | POA: Diagnosis not present

## 2019-03-01 DIAGNOSIS — R32 Unspecified urinary incontinence: Secondary | ICD-10-CM | POA: Diagnosis not present

## 2019-03-01 DIAGNOSIS — N401 Enlarged prostate with lower urinary tract symptoms: Secondary | ICD-10-CM | POA: Diagnosis not present

## 2019-03-01 NOTE — Progress Notes (Signed)
Virtual Visit via Telephone Note  I connected with Casey Tucker on 03/01/19 at 11:00 AM EDT by telephone and verified that I am speaking with the correct person using two identifiers.   I discussed the limitations, risks, security and privacy concerns of performing an evaluation and management service by telephone and the availability of in person appointments. We discussed the impact of the COVID-19 on the healthcare system, and the importance of social distancing and reducing patient and provider exposure. I also discussed with the patient that there may be a patient responsible charge related to this service. The patient expressed understanding and agreed to proceed.  Reason for visit:   History of Present Illness:  F/u -   BPH/LUTS - frequency, urgency, hesitancy and urge incontinence. Prior constipation. PVR was 0 and 35. No dysuria or hematuria. Tamsulosin was added to finasteride a couple of weeks ago. Symptoms improved. PSA in 2012 was 0.7.  Neurogenic risk includes Parkinson's.   Today, he has been well. He continues tamsulosin and finasteride. We also added solifenacin. This improved his frequency and nocturia. He has "terrible" constipation and Dr. Silvio Pate is working on it. He takes two medications. He has no trouble voiding. His flow is good. No gross hematuria. No dysuria or UTI.   Assessment and Plan:  BPH, frequency - continue combination therapy (solifenacin, tams and finasteride).   Follow Up:  6 months for PVR       I discussed the assessment and treatment plan with the patient. The patient was provided an opportunity to ask questions and all were answered. The patient agreed with the plan and demonstrated an understanding of the instructions.   The patient was advised to call back or seek an in-person evaluation if the symptoms worsen or if the condition fails to improve as anticipated.  I provided 10 minutes of non-face-to-face time during this  encounter.   Festus Aloe, MD

## 2019-03-01 NOTE — Telephone Encounter (Signed)
Unable to make app due to no schedule for [employer] and gave to Angie to make closer to time    [place]

## 2019-03-02 NOTE — Telephone Encounter (Signed)
Last filled 01-26-19 #60 Last OV 09-21-18 Next OV 07-04-19 [facility]

## 2019-03-10 ENCOUNTER — Telehealth: Payer: Self-pay | Admitting: Neurology

## 2019-03-10 NOTE — Telephone Encounter (Signed)
Pt needs to contact PCP regarding frequent urination. Called spoke with patient he states that he has seen Urologist and PCP regarding this.  Wondering is Dr. Carles Collet had any recommendations.

## 2019-03-10 NOTE — Telephone Encounter (Signed)
Pt aware to contact urology whom he has seen recently

## 2019-03-10 NOTE — Telephone Encounter (Signed)
Patient left msg with after hours. Caller states he has frequent urination and he wets the bed. He said it started about a month ago and denies andy painful urination.

## 2019-03-10 NOTE — Telephone Encounter (Signed)
No.  Would defer to urologist

## 2019-03-28 NOTE — Progress Notes (Signed)
`   Casey Tucker was seen today in the movement disorders clinic for neurologic consultation at the request of [name], MD.  The consultation is for the evaluation of PD.  The records that were made available to me were reviewed.  Pt is accompanied by his wife who supplements the history.   Pt reports first sx was in 2006.  Pt is R hand dominant.  Pt states that his next door neighbor (Network engineer) noted that he wasn't swinging his arm and thought that he had a stroke. He subsequently saw [name] in about 2006 and he was dx with PD.  He was started on mirapex and azilect.  It helped the overall stiffness.  Tremor developed in the R arm around the same time as the diagnosis.  Levodopa was started in approximately 2009-2010.  He also started an exercise program and he believes that has made a huge difference.  He began to have some motor fluctuations and saw [name] and had his surgery done in 02/2013.  It was a unilateral implant, placed into the L STN.  He has noted that he is sleeping much better since surgery.  No tremor.  [name] programmed the DBS one week post op.  He f/u with the NP at [place], and he now has an A and a B setting.  He hates the A setting (doesn't walk well) and he "cranked" his own B setting up to 280 (assuming frequency) and this helped walking some but he still has problems with balance and freezing.  He is on mirapex, 1 mg tid.  He is on carbidopa/levodopa 25/100, 7:30am/11am/2pm/6pm.    11/15/13:  Pt returns today for follow up.  Overall, he has been doing well.  He is off of medication this AM for programming.  His wife reports that he has done better on his new setting.  Seems to be walking better with less dyskinesia.  Until this morning, he was still on the carbidopa/levodopa 25/100, 1 tablet 4 times per day, Azilect 1 mg daily and Mirapex 1 mg 3 times per day.  He is exercising faithfully.  He has not had any falls.  He has been shuffling a little more this  morning, since he is off of medication.  11/21/13 update:  The patient is returning for followup, earlier than expected.  The patient reports that he really had not been doing as well over the weekend.  He did not feel like he was walking as well. He felt like the setting "wore" off 30 mins before the next levodopa dosage.  His wife wonders if he wasn't doing as well because he was at the beach this past weekend and there were more stairs to negotiate.  No falls.  No hallucinations   He did decrease his carbidopa/levodopa 25/100, one tablet 3 times per day from one tablet 4 times per day.  He is still on Mirapex 1 mg 3 times per day and Azilect 1 mg daily.  12/20/13 update:  The patient returns today for followup, accompanied by his wife and daughter who supplements the history.  The patient is currently on Mirapex, 1 mg 3 times a day, Azilect 1 mg daily and carbidopa/levodopa 25/100 at 7:30 AM/11 AM/2 PM and 6:30 PM.  Overall, the patient states that he is doing much better on his current setting.  It is not seeming to "wear off."  He is having some difficulty in the middle of the night when he  gets up and sometimes has to use a cane, but otherwise is doing well.  No falls.  On one occasion, he did try to get up in the morning and go and take a walk and was able to walk a few miles without taking his medication, but then he needed it when he got home.  He has noticed some dyskinesia, but it has not been particularly bothersome.  He would like to decrease dyskinesia, if able.  03/20/14 update:  Pt is f//u today.  Overall doing well.  He tried to decrease his carbidopa/levodopa 25/100 from one tablet 4 times per day to 1/0.5/0.5/1, but ended up going back up to one tablet 4 times per day.  He finds that he is due for his second dose at 11 AM, but by 10:30 AM he is shuffling, and he thinks it is primarily the right foot.  When he goes hiking, he states that he takes an extra half tablet at 10:30 AM and as well and  often takes another half tablet in the afternoon.  He finds that protein interferes with the levodopa dosing.  He continues to take 2 Flexeril at night.  He does not know if it helps, but has not tried to stop that since the DBS surgery.  He finds that the only time he has dyskinesia is at the end of the dose.  He asks me if we can slightly increase the DBS stimulation.  He continues to exercise.  No falls.  No hallucinations.  05/18/14 update: The patient is following up today, accompanied by his wife who supplements the history.  I tried to slightly decrease the levodopa, but the patient is was not able to do that.  He remains on carbidopa/levodopa 25/100, 1 tablet 4 times per day.  He is also on Mirapex 1 mg 3 times per day along with Azilect 1 mg daily. C/o some "slight" freezing episodes and med wearing off 1/2 hr before due but at the same time having frequent dyskinesia of the R leg.  No falls.  Staying very active.  No hallucinations.  C/o cost of azilect and asks about its benefit.    08/08/14 update:  Overall, the patient is doing well.  He has not taken medication since 6:30 this morning, and wanted me to see him off of medication.  He states that he has some "spotty freezing."  He asks about potentially taking metformin to help him.  He also asks about levodopa nasal spray.  He asks about several other experimental treatments.  He has occasional dyskinesia in the right foot but it is not bothersome to him.  Overall, he feels slightly slower than he used to.  He states that he generally is supposed to take his levodopa at 11 AM but he needs it by 10:30.  If he is very active he will need to take a few extra half dosages of levodopa throughout the day.  Generally, however, he takes 4 tablets of carbidopa/levodopa 25/100 throughout the day in addition to entacapone 200 mg twice a day and Mirapex 1 mg 3 times a day.  He is off of Azilect and wants to know if he should go back on it.  He noticed no  difference when discontinuing it.  He is off of Flexeril as well as noticed no trouble with that.  10/10/14 update:  Overall, the patient is doing well.  He is on carbidopa/levodopa 25/100 4 times per day.  He takes entacapone 200 mg with  the first and third dose of the levodopa.  He really does not notice that it helps and states that he still feels "jerky" 30 minutes before his next dosage is due.  He will often just take the levodopa and then.  He is on Mirapex 1 mg 3 times a day.  I added carbidopa/levodopa 50/200 CR at night because of cramping and the patient states that it helps but he rarely takes it because he thinks the dyskinesia that worse when he did it.  He states that he uses it if he really needs to "get up and go."  He does use Flexeril at night for cramping and thinks it helped as much as the carbidopa/levodopa 50/200 CR.  He did turn down the DBS device somewhat since last visit because of dyskinesia.  He states that he takes Azilect "off and on" because he was told by a previous neurologist that it was neuroprotective.  He asks me about isradapine.  02/12/15 update:  The patient returns today for follow-up.  I have communicated with him several times since last visit.  He called with complaints of freezing or before his next dose and we ended up slightly increasing his levodopa so that he is taking 1-1/2 tablets in the morning, one tablet at his next dose, 1-1/2 tablets in the mid afternoon and then 1 tablet in the later evening.  This helped but he states that sometimes he will take 1 in the AM and the 1/2 an hour or 2 later.  He still only takes 3-4 a day.  He takes entacapone with every other dose of the levodopa.  He is on pramipexole 1 mg 3 times per day.  Dyskinesia has been problematic despite the fact he complains about freezing and we ended up adding amantadine, 100 mg 3 times a day for that. It helped that but caused constipation and dry mouth.   He had 2 falls since last visit, one  of which was at the outer edge of my office when he came to bring me something.  He quickly turned around while carrying something and fell to his knee.  He did not get hurt.   He states that if he gets in a crowded space his feet will just get tangled together.   He does know that if he gets into "shuffle mode" he needs to stop and "reset."  He states overall shuffling is much better.  He asked me about several studies since our last visit, but these are not available to him because of the fact he has already had DBS.  He asks me again about nilotinib study.  05/15/15 update:  The patient is following up today regarding his Parkinsons disease.  States that he has been "fantastic."  He did have a postoperative MRI done since our last visit.  This is first he has ever had since his surgery.  The [facility] plane is approximately at contact number 2 on the lead.  We are currently actively using contact 1.  We have used contact 2 in the past successfully.  He is currently on carbidopa/levodopa 25/100; he has gone down a bit on this to about 4 and sometimes 4.5 tablets a day.  Is on Comtan 200 mg twice a day, Mirapex 1 mg 3 times a day and amantadine 100 mg 3 times a day for dyskinesia.  If he gets nervous, his legs will "twitch."  He is exercising on the bike in addition to doing physical labor.  Dropped his own programming to 3.2 and thinks that it has helped.  Did have 1-2 falls when "I get in a hurry."  Never got hurt.  Notices more trouble when he is at end of dosage.  08/15/15 update:  The patient is following up today regarding his Parkinsons disease.  He is on carbidopa/levodopa 25/100, 4- tablets per day (and then will take 2 extra 1/2 tablets as needed) along with entacapone, 200 mg twice a day.  He also takes pramipexole, 1 mg 3 times per day and amantadine 100 mg 3 times a day for dyskinesia.   Amantadine is causing dry mouth and he is using biotene to help that.   He has called several times since last  visit asking me about medical marijuana and its place in the treatment of Parkinsons disease.  I have talked to him about the position statement by the [employer].  He has not fallen since our last visit.  He reports increased dyskinesia only when he has increased stress.  Reports that he changed his DBS setting again back to where I had it previously (3.4).  He uses 2 flexeril at night for cramping.  He brings me articles to read on natural "supplements" and also asks me to look up a center in [place] that is owned by a "chiropractic neurologist" and has week long "camps" for patient to have Parkinson's disease that are supposed to help with healing of Parkinson's disease and not paid by insurance.  12/13/15 update:  The patient is following up today regarding his Parkinsons disease.  He is on carbidopa/levodopa 25/100, 4-5 tablets per day along with entacapone, 200 mg twice a day.  He also takes pramipexole, 1 mg 3 times per day and amantadine 100 mg 3 times a day for dyskinesia.  I reviewed records since last visit.  Fell at home on concrete on 11/30/15 and injured L calf.  Saw PCP few days later. Is better now.  States that he has learned that he just has to "stop" when he has a festinating gait.   No hallucinations.  No lightheadedness.    04/28/16 update:  He is on carbidopa/levodopa 25/100, 4-5 tablets per day (usually 4) along with entacapone, 200 mg twice a day.  He also takes pramipexole, 1 mg 3 times per day and amantadine 100 mg 3 times a day for dyskinesia.  He had started taking carbidopa/levodopa 50/200, one tablet in the AM as he thinks that this "gives me a 3 hour window before it is done."  He usually doesn't take it but sometimes he does.    This is odd dosing but he has rearranged his own medication to best suite him.  He has been physical therapy since our last visit.  He does think that it was helpful.  A U Step walker was recommended.  He isn't using it while out and  asks me what he can do for falls.  He does state that "my biggest falls are at home."  He does keep the walker next to him at bedtime because he finds that this is the most troublesome time when he gets up.  He finds that the top of his body gets ahead of the legs and that is why he falls.  Noting some trouble moving in confined spaces.  Reports that "I changed the DBS to 3.2 to see if it would help" but it didn't.  He is going to trainer 4-5 days a week.  Asks  if okay to hunt quail/fish if with someone.    09/01/16 update:  The patient returns today for follow-up.  He is on carbidopa/levodopa 25/100, 4 tablets throughout the day (and then 50% of the time he will add 1/2 extra tablet bid) and entacapone, 200 mg twice per day.  He also takes carbidopa/levodopa 50/200, one tablet in the morning prn.  He remains on Mirapex, 1 mg 3 times a day and amantadine 100 mg 3 times a day, for dyskinesia.  He did call me after our last visit and stated that he was falling, but admitted that he was not using his walker.  He was encouraged to start doing so.    He states today "I am not going to use that thing except at night."  States that he reduced his brain stimulator and "that didn't work so I went back up."  He has been going to rock steady boxing and thought that had helped.  Takes klonopin 0.5 mg for RBD and that is working well.  Needs a refill  12/28/16 update:  The patient returns today for follow-up, accompanied by his wife and daughter who supplement the history.  He is on carbidopa/levodopa 25/100, 4 tablets throughout the day (and then 50% of the time he will add 1/2 extra tablet bid) and entacapone, 200 mg twice per day.  He also takes carbidopa/levodopa 50/200, one tablet at night.  He remains on Mirapex, 1 mg 3 times a day (was going to try and drop that but had trouble cutting that one) and amantadine 100 mg 3 times a day, for dyskinesia.  He is doing boxing 3 days a week.  He asks me again about the [employer]  device.  States that he increased the DBS and when asked why he stated that he was hoping it would help his freezing/walking/balance.  02/24/17 update:  Patient seen today in follow-up, accompanied by his wife who supplements the history.  Patient is on carbidopa/levodopa, 25/100, 4 tablets during the day and entacapone 200 mg twice per day.  He takes 2 extra 1/2 tablets of carbidopa/levodopa 25/100 during the day as needed.   He takes carbidopa/levodopa 50/200 at bedtime.  Sometimes he takes 1/2 of a time released tablet.   Last visit, I told him he could try and reduce his pramipexole from 1 mg 3 times per day to 0.5 mg 3 times per day.  He states that he did that and noted no difference.  He remains on amantadine 100 mg 3 times per day.  He has had no hallucinations.  He continues to exercise with boxing and he loves it.  In regards to falls, his wife states that he is falling more.  He won't use the walker.  He is on klonopin for RBD and "I take one or two" and "it works really well."  He has noted last week his battery has reached "ERI."  06/23/17 update:  Pt seen in f/u for PD.  This patient is accompanied in the office by his spouse who supplements the history.  Patient is on carbidopa/levodopa 25/100, 4 tablets in the day.  He takes entacapone with 2 of these dosages.  He is on carbidopa/levodopa 50/200 at bedtime.  He sometimes takes this bid (one in the AM and one in PM) Last visit, I gave him a weaning schedule to get off of pramipexole.  He states that "I take a little here and there."  He is only taking 1/2 tablet prn per  patient, but then when he gives me a schedule of how he takes his medication, he includes 3 pramipexole per day.  He is still on amantadine, 100 mg 3 times per day.  He is on clonazepam 0.5 mg, 1-2 tablets at night for REM behavior disorder.  He has not had any acting out of the dreams.  He asks about freezing.  He doesn't like to use the walker because he doesn't want to be dependent  on it.  He had to give up hunting because of falls.  He is doing well with RSB.  He asks about cannabis oil.  He asks me about giving him IV thiamine.    10/20/17 update: Patient seen today in follow-up.  This patient is accompanied in the office by his spouse who supplements the history. I have received numerous phone calls from the patient and some from the family since our last visit.  His daughter called about concerns about his driving, but did not want [place] to know that she called.  I recommended that she reveal her concerns to the [facility] to generate paperwork for me, but that has not been done.  Patient has called and asked about clinical trials for nilotinib, but they were not accepting new patients and that trial has been closed for new patients.  He called me both about freezing as well as dyskinesia.  He  called me about Inbrija.  He asks about this today and discussed that this will make his dyskinesia worse.  He still wants to consider. He got stuck one time in a restaurant.   He has met with our social worker in the office since our last visit.  He called and requested a referral to the interdisciplinary clinic at [facility], but when he was contacted he refused to make an appointment.  His daughter called back and has an appt in early May.   He requested transfer to a nurse practitioner in [place], but ultimately after discussion with his primary care physician, he decided to follow-up here.  Medication wise, he remains on carbidopa/levodopa 25/100, 4 tablets throughout the day (occasionally takes an extra) and he takes entacapone with 2 of these dosages.  He is no longer on carbidopa/levodopa 50/200 at bedtime.  He is on amantadine, 100 mg, 3 times per day.  Told him again last visit to discontinue the pramipexole.  Using alarmed pill box and wife states that they are finding this beneficial.  States that biggest c/o is "timing out."  He cannot describing what timing out means but after multiple questions,  may mean freezing.  Wife thinks that since meds more regular doing better but she does not some freezing.  She notes interference with protein when eating.  Going to boxing 3 days a week.  12/15/17 update: Patient is seen today in follow-up.  I have reviewed records since last visit.  He is accompanied by his wife who supplements history.  Patient saw [name] on Nov 17, 2016.  Those records are not yet completed.  I do have a new dosing schedule that she gave him which increased his levodopa from 4 tablets to 5 tablets/day by taking 2 tablets at 7:30 AM/1 tablet at 10 AM/1 tablet at 1:30 PM/1 tablet at 4 PM and 1 tablet at 7 PM.  He was to take entacapone with each of those dosages and amantadine 3 times per day.   He didn't find that taking the entacapone with each dose of levodopa helped and he  had leg cramping and so is back taking it with taking comtan every other dose.   Today, he didn't take the 2 carbidopa/levodopa 25/100 in the AM.   [name] scheduled an appointment with [name], which is now scheduled for December 30, 2017.  The patient wanted work to back into our clinic before that appointment with [name].  He was given samples of inbrija last visit and states that he could not afford the medication as it was $1600 per month.  States that he has an approval from his insurance.  He didn't qualify for patient assist.    03/08/18 update: Patient is seen today in follow-up for Parkinson's disease.  He is accompanied by his son in law (never been here) who supplements the history.  He has seen Dr. Orland Mustard at [facility] since our last visit.  I have reviewed those records.  His programming was changed somewhat.  Like me, Dr. Orland Mustard was hesitant to recommend DBS for the other side because he was concerned about cognitive impairment.  Pt reports dyskinesia is better and son in law reports it is as well.  He has it when stressed or pushed.  He is freezing in the AM.   Patient remains on carbidopa/levodopa  25/100, 2 tablets at 7:30 AM/1 tablet at 10 AM/1 tablet at 1:00 PM/1 tablet at 4 PM and 1 tablet at 7 PM.  Told him to try last visit the schedule that [name] had given him for taking entacapone with each levodopa dose.   He is still on amantadine, 100 mg twice per day.  Pt states that falls are reduced as "I have learned how to manage it."  Son in law states that falls usually occur when doing things that he shouldn't be doing. No hallucinations.  No lightheadedness/near syncope.  He quit boxing as he felt that it interrupted his med regimen.  Is riding stationary bike.  Asks about having DBS surgery on the other side.  Family is concerned about driving.  Patient is concerned about his wife who he feels has dementia  08/03/18 update: Patient is seen today in follow-up for Parkinson's disease accompanied by his wife and daughter who supplements the history.  He is currently on carbidopa/levodopa 25/100, 2 tablets at 7:30 AM/1 tablet at 10 AM/1 tablet at 1 PM/1 tablet at 4 PM/1 tablet at 7 PM.  He takes entacapone with each of these dosages. He was to restart carbidopa/levodopa 50/200 for first morning on but he didn't do that as "I didn't want to put too much dopamine in my system."   He is on amantadine, 100 mg three per day.  Records are reviewed since last visit.  Patient had previously seen both [name] and [name] at [facility].  He then went to [facility] on May 18, 2018 for another consult with [name], as both [name] and I were leery about doing DBS on the other side because of cognitive issues.  [name] requested neurocognitive testing.  He decided not to pursue that.  Was given samples last visit of Inbrija to try and "I thought it was junk."  States that it worked some times and other times it didn't.    He is no longer driving and has turned in his license.  Asks about rytary.  Asks about nourianz.  03/30/19 update: Patient seen today in follow-up for Parkinson's disease.  Patient is  accompanied by his wife who supplements the history.  Patient is on carbidopa/levodopa  25/100, 2 tablets at 7:30 AM/1 tablet at 10 AM/1 tablet at 1 PM/1 tablet at 4 PM and 1 tablet at 7 PM.  he admits that he sometimes takes and "extra 1 or two."  He is on entacapone with each of these dosages.  He is also on amantadine, 100 mg 3 times per day.  Wife wonders if meds are being taken properly.   He was using Flexeril at bedtime as needed for muscle cramping and spasms.  His insurance has since denied that.  Medical records have been reviewed since last visit.  Patient saw urology on March 01, 2019.  Patient was on Vesicare, tamsulosin and finasteride.  Pt states today "I quit taking those."  States that he seemed to be urinating more.  Wife states that he gets up a lot at night to urinate. Those medications were not changed at his telemedicine visit in August.  Pt reports multiple falls - perhaps 10-12.  Admits that he never falls with the walker.  No hallucinations.    PREVIOUS MEDICATIONS: Sinemet, Mirapex and azilect; inbrija; amantadine  ALLERGIES:   Allergies  Allergen Reactions   Amoxicillin    Celebrex [Celecoxib]    Demerol Other (See Comments)    Hallucinations    Penicillins     REACTION: RASH    CURRENT MEDICATIONS:  Current Outpatient Medications on File Prior to Visit  Medication Sig Dispense Refill   amantadine (SYMMETREL) 100 MG capsule TAKE ONE CAPSULE THREE TIMES A DAY 270 capsule 1   carbidopa-levodopa (SINEMET IR) 25-100 MG tablet 2 at 7:30am, 1 at 10am, 1 at 1pm, 1 at 4pm, 1 at 7pm 540 tablet 1   clonazePAM (KLONOPIN) 0.5 MG tablet TAKE ONE TO TWO TABLETS BY MOUTH AT BEDTIME AS NEEDED 60 tablet 0   cyclobenzaprine (FLEXERIL) 10 MG tablet Take 2 tablets (20 mg total) by mouth at bedtime. 60 tablet 5   desmopressin (DDAVP) 0.2 MG tablet Take 1 tablet (0.2 mg total) by mouth at bedtime. 30 tablet 3   entacapone (COMTAN) 200 MG tablet Take 1 tablet (200 mg total) by  mouth 5 (five) times daily. Take with Levodopa 450 tablet 1   finasteride (PROSCAR) 5 MG tablet TAKE ONE TABLET BY MOUTH EVERY DAY 30 tablet 11   solifenacin (VESICARE) 5 MG tablet Take 1 tablet (5 mg total) by mouth daily. 30 tablet 11   tamsulosin (FLOMAX) 0.4 MG CAPS capsule Take 1 capsule (0.4 mg total) by mouth daily after supper. 30 capsule 11   No current facility-administered medications on file prior to visit.     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   Parkinson disease ([place]) 2004   Slowly progressive   Ruptured appendix teens    PAST SURGICAL HISTORY:   Past Surgical History:  Procedure Laterality Date   APPENDECTOMY     CATARACT EXTRACTION W/ INTRAOCULAR LENS  IMPLANT, BILATERAL  2017   DEEP BRAIN STIMULATOR PLACEMENT  8/14   L STN   DOPPLER ECHOCARDIOGRAPHY  01/10/2009   LV NML Mild LVH EF 60-65% aortic sclerosis w/0 stenosis    JOINT REPLACEMENT     left hip replacement 08/14/11/[place]    SUBTHALAMIC STIMULATOR BATTERY REPLACEMENT Left  03/25/2017   Procedure: Deep Brain Stimulator battery replacement;  Surgeon: Erline Levine, MD;  Location: [place];  Service: Neurosurgery;  Laterality: Left;  left   TONSILLECTOMY     TOTAL HIP REVISION  08/27/2011   Procedure: TOTAL HIP REVISION;  Surgeon: Mauri Pole, MD;  Location[facility];  Service: Orthopedics;  Laterality: Left;    SOCIAL HISTORY:   Social History   Socioeconomic History   Marital status: Married    Spouse name: Not on file   Number of children: 2   Years of education: Not on file   Highest education level: Doctorate  Occupational History   Occupation: Optometrist    Comment: medically retired  Scientist, product/process development  strain: Not on file   Food insecurity    Worry: Not on file    Inability: Not on Lexicographer needs    Medical: Not on file    Non-medical: Not on file  Tobacco Use   Smoking status: Never Smoker   Smokeless tobacco: Never Used  Substance and Sexual Activity   Alcohol use: Yes    Alcohol/week: 7.0 standard drinks    Types: 7 Standard drinks or equivalent per week    Comment: occassionally   Drug use: No   Sexual activity: Yes  Lifestyle   Physical activity    Days per week: Not on file    Minutes per session: Not on file   Stress: Not on file  Relationships   Social connections    Talks on phone: Not on file    Gets together: Not on file    Attends religious service: Not on file    Active member of club or organization: Not on file    Attends meetings of clubs or organizations: Not on file    Relationship status: Not on file   Intimate partner violence    Fear of current or ex partner: Not on file    Emotionally abused: Not on file    Physically abused: Not on file    Forced sexual activity: Not on file  Other Topics Concern   Not on file  Social History Narrative   Married, lives with wife   2 daughters      Has living will   Wife has health care POA---then daughters   Would still accept CPR--but no prolonged artificial means (ventilator or tube feeds)    FAMILY HISTORY:   Family Status  Relation Name Status   Mother  Deceased at age 21       knee replacement   Father  Deceased at age 74       tremor   Sister  Stage manager in [place]   Psychiatrist  (Not Specified)   Daughter X2 Alive    ROS:  Review of Systems  Constitutional: Negative.   HENT: Negative.   Eyes: Negative.   Respiratory: Negative.   Cardiovascular: Negative.   Gastrointestinal: Negative.   Genitourinary: Negative.   Skin: Negative.      PHYSICAL EXAMINATION:    VITALS:   Vitals:   03/30/19 0757  BP: 115/62  Pulse: 66  SpO2: 98%    Weight: 145 lb 12.8 oz (66.1 kg)  Height: _0  (1.803 m)   Wt Readings from Last 3 Encounters:  03/30/19 145 lb 12.8 oz (66.1 kg)  09/21/18 159 lb 12 oz (72.5 kg)  08/31/18 156 lb (70.8 kg)  GEN:  The patient appears stated age and is in NAD. HEENT:  Normocephalic, atraumatic.  The mucous membranes are moist. The superficial temporal arteries are without ropiness or tenderness. CV:  RRR Lungs:  CTAB Neck/HEME:  There are no carotid bruits bilaterally.  Neurological examination:  Orientation:  Montreal Cognitive Assessment  03/29/2019  Visuospatial/ Executive (0/5) 1  Naming (0/3) 3  Attention: Read list of digits (0/2) 0  Attention: Read list of letters (0/1) 0  Attention: Serial 7 subtraction starting at 100 (0/3) 2  Language: Repeat phrase (0/2) 0  Language : Fluency (0/1) 0  Abstraction (0/2) 2  Delayed Recall (0/5) 5  Orientation (0/6) 4  Total 17  Adjusted Score (based on education) 17   Cranial nerves: There is good facial symmetry. The speech is fluent and somewhat dysarthric. Soft palate rises symmetrically and there is no tongue deviation. Hearing is intact to conversational tone. Sensation: Sensation is intact to light touch throughout Motor: Strength is 5/5 in the bilateral upper and lower extremities.   Shoulder shrug is equal and symmetric.  There is no pronator drift.  Movement examination: Tone: There is normal tone in the UE/LE Abnormal movements: mild dyskinesia in the R leg only with distraction.  Has jaw dyskinesia with distraction only.   Coordination:  There is mild decremation, with any form of RAMS, including alternating supination and pronation of the forearm, hand opening and closing, finger taps, heel taps and toe taps bilaterally, R more than L, but only mild Gait and Station: The patient pushes off of the chair.  He is given a walker.  He freezes in the doorway.  The L foot occasionally sticks to the ground.  He festinates.  He generally walks  well with the walker.    LABS    Chemistry      Component Value Date/Time   NA 137 06/24/2018 1023   NA 142 08/11/2016 1701   NA 139 08/15/2011 0535   K 4.0 06/24/2018 1023   K 3.8 08/15/2011 0535   CL 102 06/24/2018 1023   CL 101 08/15/2011 0535   CO2 27 06/24/2018 1023   CO2 29 08/15/2011 0535   BUN 21 06/24/2018 1023   BUN 18 08/11/2016 1701   BUN 12 08/15/2011 0535   CREATININE 0.87 06/24/2018 1023   CREATININE 0.73 08/15/2011 0535      Component Value Date/Time   CALCIUM 9.0 06/24/2018 1023   CALCIUM 7.6 (L) 08/15/2011 0535   ALKPHOS 59 06/24/2018 1023   AST 11 06/24/2018 1023   ALT 2 06/24/2018 1023   BILITOT 1.5 (H) 06/24/2018 1023   BILITOT 1.2 08/11/2016 1701     Lab Results  Component Value Date   TSH 2.82 03/16/2012    DBS programming was performed today which is described in more detail on a separate programming procedural notes.    ASSESSMENT/PLAN:  1. Parkinson's disease.    -The patient is status post left STN DBS at [facility] on 03/14/2013.  Battery just changed on 03/25/17  -Continue carbidopa/levodopa 25/100, 2 tablets at 7:30 AM/1 tablet at 10 AM/1 tablet at 1:00 PM/1 tablet at 4 PM and 1 tablet at 7 PM.  Take entacapone with each of these dosages.  He can take a single extra tablet as needed, but I told him not to take more than that throughout the day.  -he was supposed to take carbidopa/levodopa 50/200 for morning on but didn't so will remain off of it  -discussed that each medication  dosage needs directly monitored  - wean amantadine - 100 mg bid x 1 week, then q day for a week then d/c  - doing primarily due to memory change  -needs to use walker at all times  -referral for PT.  Pt doesn't drive and wife is nervous about driving.  They don't want to get out in pandemic.  Will refer for home health  -Refer for speech therapy 2.  Memory change, consistent with Parkinsons dementia  -we scheduled neurocog testing but the patient cancelled it and didn't  reschedule.  Discussed that today, but he does not wish to proceed.  We did discuss that he does have Parkinson's related memory change.  Discussed that he should not be hunting.  He asked me this multiple times within the visit.  -Patient is no longer driving.  He asks me about this today, and has the last several visits.  He understands he should not be driving. 3.  Constipation  -Was made worse by amantadine and we are stopping that today  -needs to drink more water  -add miralax daily 3.  RBD  -on klonopin, prescribed by PCP 4.  I will see him back in the next 6 months, sooner should new neurologic issues arise.  Much greater than 50% of this 60-minute visit was spent in counseling with the patient and his wife.  This did not include DBS time.

## 2019-03-29 ENCOUNTER — Other Ambulatory Visit: Payer: Self-pay | Admitting: Internal Medicine

## 2019-03-29 NOTE — Telephone Encounter (Signed)
Klonopin last filled 03/02/2019 #60 Last OV with you CPE 06/2018 Next OV 07/04/2019 [facility]

## 2019-03-30 ENCOUNTER — Ambulatory Visit (INDEPENDENT_AMBULATORY_CARE_PROVIDER_SITE_OTHER): Payer: Medicare Other | Admitting: Neurology

## 2019-03-30 ENCOUNTER — Encounter: Payer: Self-pay | Admitting: Neurology

## 2019-03-30 ENCOUNTER — Other Ambulatory Visit: Payer: Self-pay

## 2019-03-30 VITALS — BP 115/62 | HR 66 | Ht 71.0 in | Wt 145.8 lb

## 2019-03-30 DIAGNOSIS — G20 Parkinson's disease: Secondary | ICD-10-CM

## 2019-03-30 DIAGNOSIS — G249 Dystonia, unspecified: Secondary | ICD-10-CM | POA: Diagnosis not present

## 2019-03-30 DIAGNOSIS — F0280 Dementia in other diseases classified elsewhere without behavioral disturbance: Secondary | ICD-10-CM

## 2019-03-30 NOTE — Procedures (Signed)
DBS Programming was performed.    Total time spent reviewing programming was 5 minutes.  Device was confirmed to be on.  Soft start was confirmed to be on.  Impedences were checked and were within normal limits.   All impedances were well within normal limits and are recorded on a separate worksheet.  Battery was checked and was determined to be functioning normally (2.91).  Final settings were as follows:  Left brain electrode, Group A:     2-C+; Amplitude  3.7   V   ; Pulse width 60 microseconds;   Frequency   135   Hz.  Left brain electrode, group B:  1-C+, amplitude: 3.2 V, PW: 90, frequency 140  Group A was active at the time pt left the office.  Right brain electrode:     n/a

## 2019-03-30 NOTE — Patient Instructions (Addendum)
1.  Decrease amantadine - 100 mg - 1 tablet twice per day for a week then once per day for a week and then STOP the amantadine 2.  We will refer for physical therapy 3.  Use walker at all times 4. A referral has been place for Physical Therapy and Speech Therapy at [facility] someone should be calling you to schedule home visit within the next 2-3 days.

## 2019-04-04 ENCOUNTER — Telehealth: Payer: Self-pay

## 2019-04-04 NOTE — Telephone Encounter (Signed)
Received call from [facility] at [facility] requesting to delay patient start of care until tomorrow.   Pt spouse request to start tomorrow. Called spoke with Melissa given verbal approval to delay until tomorrow.

## 2019-04-05 ENCOUNTER — Telehealth: Payer: Self-pay | Admitting: Internal Medicine

## 2019-04-05 ENCOUNTER — Telehealth: Payer: Self-pay | Admitting: Neurology

## 2019-04-05 DIAGNOSIS — G4752 REM sleep behavior disorder: Secondary | ICD-10-CM | POA: Diagnosis not present

## 2019-04-05 DIAGNOSIS — K219 Gastro-esophageal reflux disease without esophagitis: Secondary | ICD-10-CM | POA: Diagnosis not present

## 2019-04-05 DIAGNOSIS — R1311 Dysphagia, oral phase: Secondary | ICD-10-CM | POA: Diagnosis not present

## 2019-04-05 DIAGNOSIS — Z96642 Presence of left artificial hip joint: Secondary | ICD-10-CM | POA: Diagnosis not present

## 2019-04-05 DIAGNOSIS — F0280 Dementia in other diseases classified elsewhere without behavioral disturbance: Secondary | ICD-10-CM | POA: Diagnosis not present

## 2019-04-05 DIAGNOSIS — I350 Nonrheumatic aortic (valve) stenosis: Secondary | ICD-10-CM | POA: Diagnosis not present

## 2019-04-05 DIAGNOSIS — G20 Parkinson's disease: Secondary | ICD-10-CM | POA: Diagnosis not present

## 2019-04-05 NOTE — Telephone Encounter (Signed)
Called patient several times unable to get through phone call can no be completed at this time. Will try back later

## 2019-04-05 NOTE — Telephone Encounter (Signed)
I don't believe I started him on anything.  I am weaning him off of amantadine.  That should not cause leg pain.  When does he have it?

## 2019-04-05 NOTE — Telephone Encounter (Signed)
Spoke to pt. Made appt for Friday at 68.

## 2019-04-05 NOTE — Telephone Encounter (Signed)
Patient was calling in about his problems with new medication and he has some questions. Extreme pain in both legs. Thanks!

## 2019-04-05 NOTE — Telephone Encounter (Signed)
I saw him yesterday to pick up shrimp (5PM) and he was walking around and didn't complain of severe pain (just mild pain)  I suggested trying tylenol Speak to him and set up visit----I will need to examine him to decide what is going on

## 2019-04-05 NOTE — Telephone Encounter (Signed)
Liji with [facility] stated that the patient was out seeing the patient and he stated he has been having some severe Pain in legs for the past 1 week.  She stated that it is mainly over the calf.  Patient described it as a aching/hurting pain. She stated that he tried to take some medication to try to help with pain and a heating pad but it is not helping.  Patient stated that it is usually later in the day and/or at night.  Nurse stated that while he is describing the pain you can see by the expression on the patient's face that he is some serious pain.  While on the phone she is asking the patient about his pain and he stated that pain level is 8 out of 10.  And it has been lasting 30 mins now  She would like to know what the Dr suggest and/or if this patient could be seen   Phone-

## 2019-04-05 NOTE — Telephone Encounter (Signed)
Received call from [place], [place] from [facility]  She is with patient now she states that patient is in a lot of pain. Pain Started 1 week ago, new pain he did not have this 1 week ago.  Current Meds:  sinemet 25/100 2 tablets 7:30 am then 1 tablet 4 times a day Comtan 200 mg 5 times a day    She will continue PT home visits  4 times for 1 week 3 times for 4 weeks 2 time for 2 weeks Verbal given

## 2019-04-06 ENCOUNTER — Telehealth: Payer: Self-pay | Admitting: Neurology

## 2019-04-06 DIAGNOSIS — I350 Nonrheumatic aortic (valve) stenosis: Secondary | ICD-10-CM | POA: Diagnosis not present

## 2019-04-06 DIAGNOSIS — G4752 REM sleep behavior disorder: Secondary | ICD-10-CM | POA: Diagnosis not present

## 2019-04-06 DIAGNOSIS — K219 Gastro-esophageal reflux disease without esophagitis: Secondary | ICD-10-CM | POA: Diagnosis not present

## 2019-04-06 DIAGNOSIS — G20 Parkinson's disease: Secondary | ICD-10-CM | POA: Diagnosis not present

## 2019-04-06 DIAGNOSIS — R1311 Dysphagia, oral phase: Secondary | ICD-10-CM | POA: Diagnosis not present

## 2019-04-06 DIAGNOSIS — F0280 Dementia in other diseases classified elsewhere without behavioral disturbance: Secondary | ICD-10-CM | POA: Diagnosis not present

## 2019-04-06 NOTE — Telephone Encounter (Signed)
See other note

## 2019-04-06 NOTE — Telephone Encounter (Signed)
Casey Tucker from [facility] called as well [phone] needing Verbal Orders for Severe Pain, not getting help from Tylenol, Swallowing, Malnutrition, Voice and Dry Mouth. He is needing to see Dr. Carles Collet. Thanks

## 2019-04-06 NOTE — Telephone Encounter (Signed)
Called patient regarding provider response no answer  Phone rings no voice mail to leave message

## 2019-04-06 NOTE — Telephone Encounter (Signed)
Left message with after hour service on 04-06-19 @ 12:20 pm    Patient daughter is calling to speak to someone about her fathers home health visit he is having a lot of urinary issues

## 2019-04-06 NOTE — Telephone Encounter (Signed)
Spoke with Deidre Requesting verbal orders for speech therapy as well    [facility] HOME [facility]ALTH verbal orders 1 WEEK X 1 2 WEEK X 3 1 WEEK X 1  For voice, swallow and nutritions   Verbal given

## 2019-04-06 NOTE — Telephone Encounter (Signed)
Did he WEAN off of the amantadine as directed?  If so, he should not be off of it yet.  See wean schedule given to patient.  I still can't imagine that this would cause leg pain though.

## 2019-04-07 ENCOUNTER — Telehealth: Payer: Self-pay | Admitting: Neurology

## 2019-04-07 ENCOUNTER — Other Ambulatory Visit: Payer: Self-pay

## 2019-04-07 ENCOUNTER — Ambulatory Visit (INDEPENDENT_AMBULATORY_CARE_PROVIDER_SITE_OTHER): Payer: Medicare Other | Admitting: Internal Medicine

## 2019-04-07 ENCOUNTER — Ambulatory Visit (INDEPENDENT_AMBULATORY_CARE_PROVIDER_SITE_OTHER)
Admission: RE | Admit: 2019-04-07 | Discharge: 2019-04-07 | Disposition: A | Discharge status: Home / Self Care | Payer: Medicare Other | Source: Ambulatory Visit | Attending: Internal Medicine | Admitting: Internal Medicine

## 2019-04-07 ENCOUNTER — Encounter: Payer: Self-pay | Admitting: Internal Medicine

## 2019-04-07 VITALS — BP 100/58 | HR 73 | Temp 97.6°F | Ht 71.0 in | Wt 141.0 lb

## 2019-04-07 DIAGNOSIS — E441 Mild protein-calorie malnutrition: Secondary | ICD-10-CM | POA: Diagnosis not present

## 2019-04-07 DIAGNOSIS — Z23 Encounter for immunization: Secondary | ICD-10-CM

## 2019-04-07 DIAGNOSIS — M79605 Pain in left leg: Secondary | ICD-10-CM

## 2019-04-07 DIAGNOSIS — I499 Cardiac arrhythmia, unspecified: Secondary | ICD-10-CM

## 2019-04-07 DIAGNOSIS — R200 Anesthesia of skin: Secondary | ICD-10-CM | POA: Diagnosis not present

## 2019-04-07 DIAGNOSIS — M79604 Pain in right leg: Secondary | ICD-10-CM | POA: Diagnosis not present

## 2019-04-07 DIAGNOSIS — M48062 Spinal stenosis, lumbar region with neurogenic claudication: Secondary | ICD-10-CM | POA: Insufficient documentation

## 2019-04-07 LAB — CBC
HCT: 27.1 % — ABNORMAL LOW (ref 39.0–52.0)
Hemoglobin: 9.5 g/dL — ABNORMAL LOW (ref 13.0–17.0)
MCHC: 35.3 g/dL (ref 30.0–36.0)
MCV: 115.3 fl — ABNORMAL HIGH (ref 78.0–100.0)
Platelets: 286 10*3/uL (ref 150.0–400.0)
RBC: 2.35 Mil/uL — ABNORMAL LOW (ref 4.22–5.81)
RDW: 14.9 % (ref 11.5–15.5)
WBC: 9.4 10*3/uL (ref 4.0–10.5)

## 2019-04-07 LAB — COMPREHENSIVE METABOLIC PANEL
ALT: 4 U/L (ref 0–53)
AST: 10 U/L (ref 0–37)
Albumin: 4.1 g/dL (ref 3.5–5.2)
Alkaline Phosphatase: 65 U/L (ref 39–117)
BUN: 17 mg/dL (ref 6–23)
CO2: 30 mEq/L (ref 19–32)
Calcium: 9.4 mg/dL (ref 8.4–10.5)
Chloride: 102 mEq/L (ref 96–112)
Creatinine, Ser: 0.96 mg/dL (ref 0.40–1.50)
GFR: 75.85 mL/min (ref >60.00)
Glucose, Bld: 82 mg/dL (ref 70–99)
Potassium: 4.4 mEq/L (ref 3.5–5.1)
Sodium: 138 mEq/L (ref 135–145)
Total Bilirubin: 2.1 mg/dL — ABNORMAL HIGH (ref 0.2–1.2)
Total Protein: 6.2 g/dL (ref 6.0–8.3)

## 2019-04-07 LAB — SEDIMENTATION RATE: Sed Rate: 10 mm/hr (ref 0–20)

## 2019-04-07 LAB — CK: Total CK: 44 U/L (ref 7–232)

## 2019-04-07 LAB — T4, FREE: Free T4: 0.85 ng/dL (ref 0.60–1.60)

## 2019-04-07 LAB — MAGNESIUM: Magnesium: 2.3 mg/dL (ref 1.5–2.5)

## 2019-04-07 LAB — VITAMIN B12: Vitamin B-12: 1195 pg/mL — ABNORMAL HIGH (ref 211–911)

## 2019-04-07 NOTE — Assessment & Plan Note (Signed)
Afternoon, not exertional, can be severe It sounds most like muscle spasm and I suspect metabolic cause (related to severe weight loss) Will check labs Stop some of the bladder meds Try heat/massage

## 2019-04-07 NOTE — Assessment & Plan Note (Addendum)
New Will check EKG This shows sinus @68  with PACs No ischemic changes and no sig change

## 2019-04-07 NOTE — Patient Instructions (Signed)
Stop the vesicare and DDAVP. Add boost or ensure--- 2 cans daily in between meals. Increase your fluid intake

## 2019-04-07 NOTE — Telephone Encounter (Signed)
Called spoke with patient he is still weaning off medication as directed. He had appt with PCP  Today whom address his leg pain and is now taken care of this for him.

## 2019-04-07 NOTE — Telephone Encounter (Signed)
Ok to miss dose not to double up but to try to stay on schedule. Called patient to let him to this no answer no voice mail phone just rings

## 2019-04-07 NOTE — Telephone Encounter (Signed)
Patient needs to know what he needs to do if he missed a pill, does he double up or just cont as normal please call

## 2019-04-07 NOTE — Assessment & Plan Note (Addendum)
Stop new medications Add ensure/boost Check labs Will recheck CXR as well

## 2019-04-07 NOTE — Progress Notes (Signed)
Subjective:    Patient ID: Casey Tucker, male    DOB: 27-Mar-1941, 78 y.o.   MRN: [id number]  HPI Here due to intermittent, but severe, leg pain  Will come on hard--calves and thighs "Hurts like hell" Started about a week or 2 ago No fall or injury Usually ~4PM---not positional or exertional Like a cramp Yesterday --lasted on and off for at least 30 minutes and hours at times Tried tylenol---may have helped a little/briefly  Current Outpatient Medications on File Prior to Visit  Medication Sig Dispense Refill  . amantadine (SYMMETREL) 100 MG capsule TAKE ONE CAPSULE THREE TIMES A DAY 270 capsule 1  . carbidopa-levodopa (SINEMET IR) 25-100 MG tablet 2 at 7:30am, 1 at 10am, 1 at 1pm, 1 at 4pm, 1 at 7pm 540 tablet 1  . clonazePAM (KLONOPIN) 0.5 MG tablet TAKE ONE TO TWO TABLETS BY MOUTH AT BEDTIME AS NEEDED 60 tablet 0  . cyclobenzaprine (FLEXERIL) 10 MG tablet Take 2 tablets (20 mg total) by mouth at bedtime. 60 tablet 5  . desmopressin (DDAVP) 0.2 MG tablet Take 1 tablet (0.2 mg total) by mouth at bedtime. 30 tablet 3  . entacapone (COMTAN) 200 MG tablet Take 1 tablet (200 mg total) by mouth 5 (five) times daily. Take with Levodopa 450 tablet 1  . finasteride (PROSCAR) 5 MG tablet TAKE ONE TABLET BY MOUTH EVERY DAY 30 tablet 11  . solifenacin (VESICARE) 5 MG tablet Take 1 tablet (5 mg total) by mouth daily. 30 tablet 11  . tamsulosin (FLOMAX) 0.4 MG CAPS capsule Take 1 capsule (0.4 mg total) by mouth daily after supper. 30 capsule 11   No current facility-administered medications on file prior to visit.     Allergies  Allergen Reactions  . Amoxicillin   . Celebrex [Celecoxib]   . Demerol Other (See Comments)    Hallucinations   . Penicillins     REACTION: RASH    Past Medical History:  Diagnosis Date  . Arthritis    left hip   . GERD (gastroesophageal reflux disease) Pre 2002   Mild  . History of kidney stones   . 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 Parkinson disease ([place]) 2004   Slowly progressive  . Ruptured appendix teens    Past Surgical History:  Procedure Laterality Date  . APPENDECTOMY    . CATARACT EXTRACTION W/ INTRAOCULAR LENS  IMPLANT, BILATERAL  2017  . DEEP BRAIN STIMULATOR PLACEMENT  8/14   L STN  . DOPPLER ECHOCARDIOGRAPHY  01/10/2009   LV NML Mild LVH EF 60-65% aortic sclerosis w/0 stenosis   . JOINT REPLACEMENT     left hip replacement 08/14/11/[place]   . SUBTHALAMIC STIMULATOR BATTERY REPLACEMENT Left 03/25/2017   Procedure: Deep Brain Stimulator battery replacement;  Surgeon: Erline Levine, MD;  Location: [place];  Service: Neurosurgery;  Laterality: Left;  left  . TONSILLECTOMY    . TOTAL HIP REVISION  08/27/2011   Procedure: TOTAL HIP REVISION;  Surgeon: Mauri Pole, MD;  Location: [facility];  Service: Orthopedics;  Laterality: Left;    Family History  Problem Relation Age of Onset  . Arthritis Mother        knee replacement  . COPD Father        emphysema, smoker  . Heart disease Father        CHF  . Healthy Sister   . Alcohol abuse Paternal Uncle   .  Rheum arthritis Daughter     Social History   Socioeconomic History  . Marital status: Married    Spouse name: Not on file  . Number of children: 2  . Years of education: Not on file  . Highest education level: Doctorate  Occupational History  . 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 Financial resource strain: Not on file  . Food insecurity    Worry: Not on file    Inability: Not on file  . Transportation needs    Medical: Not on file    Non-medical: Not on file  Tobacco Use  . Smoking status: Never Smoker  . Smokeless tobacco: Never Used  Substance and Sexual Activity  . Alcohol use: Yes    Alcohol/week: 7.0 standard drinks    Types: 7 Standard drinks or equivalent per week    Comment: occassionally  . Drug use: No  . Sexual activity:  Yes  Lifestyle  . Physical activity    Days per week: Not on file    Minutes per session: Not on file  . Stress: Not on file  Relationships  . Social Herbalist on phone: Not on file    Gets together: Not on file    Attends religious service: Not on file    Active member of club or organization: Not on file    Attends meetings of clubs or organizations: Not on file    Relationship status: Not on file  . Intimate partner violence    Fear of current or ex partner: Not on file    Emotionally abused: Not on file    Physically abused: Not on file    Forced sexual activity: Not on file  Other Topics Concern  . Not on file  Social History Narrative   Married, lives with wife   2 daughters      Has living will   Wife has health care POA---then daughters   Would still accept CPR--but no prolonged artificial means (ventilator or tube feeds)   Review of Systems Has been losing weight-- down 18# in 6 months Works out daily---push ups, leg lifts, stationery bike-----no changes recently Still bothered by night urine incontrinence---DDAVP and vesicare haven't really helped this    Objective:   Physical Exam  Constitutional: No distress.  Clear muscle wasting  Neck: No thyromegaly present.  Cardiovascular: Normal rate. Exam reveals no gallop.  No murmur heard. Faint but clearly palpable distal pulses Irregular rhythm  Respiratory: Effort normal and breath sounds normal. No respiratory distress. He has no wheezes. He has no rales.  GI: Soft. There is no abdominal tenderness.  Musculoskeletal:        General: No edema.     Comments: No leg tenderness or swelling  Lymphadenopathy:    He has no cervical adenopathy.  Psychiatric: He has a normal mood and affect. His behavior is normal.           Assessment & Plan:

## 2019-04-10 ENCOUNTER — Telehealth: Payer: Self-pay

## 2019-04-10 DIAGNOSIS — G20 Parkinson's disease: Secondary | ICD-10-CM | POA: Diagnosis not present

## 2019-04-10 DIAGNOSIS — R1311 Dysphagia, oral phase: Secondary | ICD-10-CM | POA: Diagnosis not present

## 2019-04-10 DIAGNOSIS — G4752 REM sleep behavior disorder: Secondary | ICD-10-CM | POA: Diagnosis not present

## 2019-04-10 DIAGNOSIS — F0280 Dementia in other diseases classified elsewhere without behavioral disturbance: Secondary | ICD-10-CM | POA: Diagnosis not present

## 2019-04-10 DIAGNOSIS — K219 Gastro-esophageal reflux disease without esophagitis: Secondary | ICD-10-CM | POA: Diagnosis not present

## 2019-04-10 DIAGNOSIS — I350 Nonrheumatic aortic (valve) stenosis: Secondary | ICD-10-CM | POA: Diagnosis not present

## 2019-04-10 MED ORDER — TIZANIDINE HCL 2 MG PO TABS: 2.0000 mg | ORAL_TABLET | Freq: Three times a day (TID) | ORAL | 0 refills | Status: DC | PRN

## 2019-04-10 NOTE — Telephone Encounter (Signed)
Left a message on VM for Deidra. Spoke to pt's wife.

## 2019-04-10 NOTE — Telephone Encounter (Signed)
Please her know (and patient/wife) that I sent a prescription for a muscle relaxer--to see if that would help at all

## 2019-04-10 NOTE — Telephone Encounter (Signed)
Casey Tucker with [facility] left v/m requesting cb; pt having acute/chronic severe bilateral lower extremity pain and Casey Tucker wants to know if there is something for pain relief past Tylenol.pt had visit 04/07/19 for lower extremity pain.Please advise.

## 2019-04-11 ENCOUNTER — Telehealth: Payer: Self-pay | Admitting: Internal Medicine

## 2019-04-11 DIAGNOSIS — K219 Gastro-esophageal reflux disease without esophagitis: Secondary | ICD-10-CM | POA: Diagnosis not present

## 2019-04-11 DIAGNOSIS — G4752 REM sleep behavior disorder: Secondary | ICD-10-CM | POA: Diagnosis not present

## 2019-04-11 DIAGNOSIS — R1311 Dysphagia, oral phase: Secondary | ICD-10-CM | POA: Diagnosis not present

## 2019-04-11 DIAGNOSIS — I350 Nonrheumatic aortic (valve) stenosis: Secondary | ICD-10-CM | POA: Diagnosis not present

## 2019-04-11 DIAGNOSIS — G20 Parkinson's disease: Secondary | ICD-10-CM | POA: Diagnosis not present

## 2019-04-11 DIAGNOSIS — F0280 Dementia in other diseases classified elsewhere without behavioral disturbance: Secondary | ICD-10-CM | POA: Diagnosis not present

## 2019-04-11 NOTE — Telephone Encounter (Signed)
Did reach her today We can consider referral to the geriatric center--but I am a geriatrician (basically) Discussed that we stopped the medications for his bladder, hoping that would help I am concerned about his weight loss as well  They are not willing to go into [facility] as yet (mostly his wife) Need home care--discussed alternatives for this

## 2019-04-11 NOTE — Telephone Encounter (Signed)
PC with Dr Earnestine Leys He has evaluated him for the leg pain as well Wonders about spinal stenosis---reviewed the testing and my concerns  Discussed options He wants to try meloxicam that has helped him in the past---renal function fine so okay May consider some gabapentin as well----just asked him to wait to see if the tizanidine does any good (and if not, have him stop it before gabapentin trial)

## 2019-04-11 NOTE — Telephone Encounter (Signed)
She will come in with him for his appointment next month

## 2019-04-11 NOTE — Telephone Encounter (Signed)
Called and left her message about this Will try to reach her tomorrow before proceeding with the consultation

## 2019-04-11 NOTE — Telephone Encounter (Signed)
[name] returned Dr.Letvak's call.  I let her know he would be calling her tomorrow.

## 2019-04-11 NOTE — Telephone Encounter (Signed)
Patient's daughter, [name] called.  She'd like for patient to be referred to a Geriatrician. She was interested in patient seeing Dr.Jan Busby-Whitehead at [facility] phone number  [phone]. [name] said if Dr.Letvak has someone else in mind that would be fine. Patient's  not managing his medications well at home. He's falling at night. Patient's wife is unable to help patient up when he falls. He's up several times at night to go to the bathroom. Patient needs help at home to enhance his quality of life. If Dr.Letvak has any questions, he can call [name].

## 2019-04-12 DIAGNOSIS — G4752 REM sleep behavior disorder: Secondary | ICD-10-CM | POA: Diagnosis not present

## 2019-04-12 DIAGNOSIS — K219 Gastro-esophageal reflux disease without esophagitis: Secondary | ICD-10-CM | POA: Diagnosis not present

## 2019-04-12 DIAGNOSIS — R1311 Dysphagia, oral phase: Secondary | ICD-10-CM | POA: Diagnosis not present

## 2019-04-12 DIAGNOSIS — G20 Parkinson's disease: Secondary | ICD-10-CM | POA: Diagnosis not present

## 2019-04-12 DIAGNOSIS — R918 Other nonspecific abnormal finding of lung field: Secondary | ICD-10-CM | POA: Diagnosis not present

## 2019-04-12 DIAGNOSIS — J189 Pneumonia, unspecified organism: Secondary | ICD-10-CM | POA: Diagnosis not present

## 2019-04-12 DIAGNOSIS — I350 Nonrheumatic aortic (valve) stenosis: Secondary | ICD-10-CM | POA: Diagnosis not present

## 2019-04-12 DIAGNOSIS — F0280 Dementia in other diseases classified elsewhere without behavioral disturbance: Secondary | ICD-10-CM | POA: Diagnosis not present

## 2019-04-13 DIAGNOSIS — I350 Nonrheumatic aortic (valve) stenosis: Secondary | ICD-10-CM | POA: Diagnosis not present

## 2019-04-13 DIAGNOSIS — R1311 Dysphagia, oral phase: Secondary | ICD-10-CM | POA: Diagnosis not present

## 2019-04-13 DIAGNOSIS — F0280 Dementia in other diseases classified elsewhere without behavioral disturbance: Secondary | ICD-10-CM | POA: Diagnosis not present

## 2019-04-13 DIAGNOSIS — G4752 REM sleep behavior disorder: Secondary | ICD-10-CM | POA: Diagnosis not present

## 2019-04-13 DIAGNOSIS — K219 Gastro-esophageal reflux disease without esophagitis: Secondary | ICD-10-CM | POA: Diagnosis not present

## 2019-04-13 DIAGNOSIS — G20 Parkinson's disease: Secondary | ICD-10-CM | POA: Diagnosis not present

## 2019-04-14 ENCOUNTER — Telehealth: Payer: Self-pay | Admitting: Neurology

## 2019-04-14 DIAGNOSIS — G20 Parkinson's disease: Secondary | ICD-10-CM | POA: Diagnosis not present

## 2019-04-14 DIAGNOSIS — K219 Gastro-esophageal reflux disease without esophagitis: Secondary | ICD-10-CM | POA: Diagnosis not present

## 2019-04-14 DIAGNOSIS — F0280 Dementia in other diseases classified elsewhere without behavioral disturbance: Secondary | ICD-10-CM | POA: Diagnosis not present

## 2019-04-14 DIAGNOSIS — R1311 Dysphagia, oral phase: Secondary | ICD-10-CM | POA: Diagnosis not present

## 2019-04-14 DIAGNOSIS — I350 Nonrheumatic aortic (valve) stenosis: Secondary | ICD-10-CM | POA: Diagnosis not present

## 2019-04-14 DIAGNOSIS — G4752 REM sleep behavior disorder: Secondary | ICD-10-CM | POA: Diagnosis not present

## 2019-04-14 NOTE — Telephone Encounter (Signed)
Spoke with Casey Tucker provider response was given to her.

## 2019-04-14 NOTE — Telephone Encounter (Signed)
i'm sorry.  He will need to f/u with PCP for this.

## 2019-04-14 NOTE — Telephone Encounter (Signed)
Deidre called regarding this patient and needing to have an prescription called in for Chronic Dry Mouth. Please Call [phone]. Thanks

## 2019-04-17 DIAGNOSIS — K219 Gastro-esophageal reflux disease without esophagitis: Secondary | ICD-10-CM | POA: Diagnosis not present

## 2019-04-17 DIAGNOSIS — F0280 Dementia in other diseases classified elsewhere without behavioral disturbance: Secondary | ICD-10-CM | POA: Diagnosis not present

## 2019-04-17 DIAGNOSIS — R1311 Dysphagia, oral phase: Secondary | ICD-10-CM | POA: Diagnosis not present

## 2019-04-17 DIAGNOSIS — G4752 REM sleep behavior disorder: Secondary | ICD-10-CM | POA: Diagnosis not present

## 2019-04-17 DIAGNOSIS — G20 Parkinson's disease: Secondary | ICD-10-CM | POA: Diagnosis not present

## 2019-04-17 DIAGNOSIS — I350 Nonrheumatic aortic (valve) stenosis: Secondary | ICD-10-CM | POA: Diagnosis not present

## 2019-04-18 DIAGNOSIS — G20 Parkinson's disease: Secondary | ICD-10-CM | POA: Diagnosis not present

## 2019-04-18 DIAGNOSIS — G4752 REM sleep behavior disorder: Secondary | ICD-10-CM | POA: Diagnosis not present

## 2019-04-18 DIAGNOSIS — R1311 Dysphagia, oral phase: Secondary | ICD-10-CM | POA: Diagnosis not present

## 2019-04-18 DIAGNOSIS — F0280 Dementia in other diseases classified elsewhere without behavioral disturbance: Secondary | ICD-10-CM | POA: Diagnosis not present

## 2019-04-18 DIAGNOSIS — I350 Nonrheumatic aortic (valve) stenosis: Secondary | ICD-10-CM | POA: Diagnosis not present

## 2019-04-18 DIAGNOSIS — K219 Gastro-esophageal reflux disease without esophagitis: Secondary | ICD-10-CM | POA: Diagnosis not present

## 2019-04-19 ENCOUNTER — Telehealth: Payer: Self-pay

## 2019-04-19 DIAGNOSIS — K219 Gastro-esophageal reflux disease without esophagitis: Secondary | ICD-10-CM | POA: Diagnosis not present

## 2019-04-19 DIAGNOSIS — I350 Nonrheumatic aortic (valve) stenosis: Secondary | ICD-10-CM | POA: Diagnosis not present

## 2019-04-19 DIAGNOSIS — G4752 REM sleep behavior disorder: Secondary | ICD-10-CM | POA: Diagnosis not present

## 2019-04-19 DIAGNOSIS — F0280 Dementia in other diseases classified elsewhere without behavioral disturbance: Secondary | ICD-10-CM | POA: Diagnosis not present

## 2019-04-19 DIAGNOSIS — R1311 Dysphagia, oral phase: Secondary | ICD-10-CM | POA: Diagnosis not present

## 2019-04-19 DIAGNOSIS — G20 Parkinson's disease: Secondary | ICD-10-CM | POA: Diagnosis not present

## 2019-04-19 NOTE — Telephone Encounter (Signed)
Delay visit for today until the end of plan of care.  Request verbal approval  Verbal given

## 2019-04-20 DIAGNOSIS — I350 Nonrheumatic aortic (valve) stenosis: Secondary | ICD-10-CM | POA: Diagnosis not present

## 2019-04-20 DIAGNOSIS — K219 Gastro-esophageal reflux disease without esophagitis: Secondary | ICD-10-CM | POA: Diagnosis not present

## 2019-04-20 DIAGNOSIS — F0280 Dementia in other diseases classified elsewhere without behavioral disturbance: Secondary | ICD-10-CM | POA: Diagnosis not present

## 2019-04-20 DIAGNOSIS — G4752 REM sleep behavior disorder: Secondary | ICD-10-CM | POA: Diagnosis not present

## 2019-04-20 DIAGNOSIS — G20 Parkinson's disease: Secondary | ICD-10-CM | POA: Diagnosis not present

## 2019-04-20 DIAGNOSIS — R1311 Dysphagia, oral phase: Secondary | ICD-10-CM | POA: Diagnosis not present

## 2019-04-24 ENCOUNTER — Other Ambulatory Visit: Payer: Self-pay

## 2019-04-24 ENCOUNTER — Telehealth: Payer: Self-pay | Admitting: Internal Medicine

## 2019-04-24 ENCOUNTER — Telehealth: Payer: Self-pay | Admitting: Neurology

## 2019-04-24 ENCOUNTER — Other Ambulatory Visit: Payer: Self-pay | Admitting: Internal Medicine

## 2019-04-24 DIAGNOSIS — G4752 REM sleep behavior disorder: Secondary | ICD-10-CM | POA: Diagnosis not present

## 2019-04-24 DIAGNOSIS — K219 Gastro-esophageal reflux disease without esophagitis: Secondary | ICD-10-CM | POA: Diagnosis not present

## 2019-04-24 DIAGNOSIS — R1311 Dysphagia, oral phase: Secondary | ICD-10-CM | POA: Diagnosis not present

## 2019-04-24 DIAGNOSIS — F0280 Dementia in other diseases classified elsewhere without behavioral disturbance: Secondary | ICD-10-CM | POA: Diagnosis not present

## 2019-04-24 DIAGNOSIS — G20 Parkinson's disease: Secondary | ICD-10-CM | POA: Diagnosis not present

## 2019-04-24 DIAGNOSIS — I350 Nonrheumatic aortic (valve) stenosis: Secondary | ICD-10-CM | POA: Diagnosis not present

## 2019-04-24 MED ORDER — CARBIDOPA-LEVODOPA 25-100 MG PO TABS: ORAL_TABLET | ORAL | 1 refills | Status: DC

## 2019-04-24 MED ORDER — GABAPENTIN 100 MG PO CAPS: 100.0000 mg | ORAL_CAPSULE | Freq: Every day | ORAL | 0 refills | Status: DC

## 2019-04-24 NOTE — Telephone Encounter (Signed)
Spoke with patient he was made aware of this. He was only taken 4 tablets a day patient was given instruction on how much he should be taken.

## 2019-04-24 NOTE — Telephone Encounter (Signed)
Patient called and said, "I need to take more dopamine when I'm doing my exercises that Dr. Carles Collet recommended. Does she have any other suggestions for me?" I confirmed this with the patient several times.  [place]

## 2019-04-24 NOTE — Telephone Encounter (Signed)
Spoke with patient he was made aware of this. He will contact Dr. Silvio Pate to discuss possible changing medication.

## 2019-04-24 NOTE — Telephone Encounter (Signed)
Have him stop the tizanidine though (I assume it didn't really help)

## 2019-04-24 NOTE — Telephone Encounter (Signed)
Requested Prescriptions   Pending Prescriptions Disp Refills  . carbidopa-levodopa (SINEMET IR) 25-100 MG tablet 540 tablet 1    Sig: 2 at 7:30am, 1 at 10am, 1 at 1pm, 1 at 4pm, 1 at 7pm   Rx last filled: 01/09/19 #450 1 REFILL  Pt last seen:03/30/19  Follow up appt scheduled:09/11/2019

## 2019-04-24 NOTE — Telephone Encounter (Signed)
Per 9/22 phone note with Dr. Silvio Pate.  He has tried some new medication (tizanidine) for that.  He wrote that if it didn't help he would try gabapentin.

## 2019-04-24 NOTE — Telephone Encounter (Signed)
Gabapentin is not on his med list. Will see if Dr Silvio Pate knows anything about it.

## 2019-04-24 NOTE — Telephone Encounter (Signed)
Last filled 03-30-19 #60 Last OV 04-07-19 Next OV 05-10-19 [facility]

## 2019-04-24 NOTE — Telephone Encounter (Signed)
He should be on 6 total carbidopa/levodopa 25/100 per day.  I told him he can take an extra if needed but not more than that.

## 2019-04-24 NOTE — Telephone Encounter (Signed)
I hadn't prescribed it yet Let him know that I am sending it now He should start with 100mg  at bedtime---and increase to 200mg  in 3 days if his pain is no better. If it is not helping during the day after 2 weeks or so, we can consider adding a daytime dose (but it can be sedating)

## 2019-04-24 NOTE — Telephone Encounter (Signed)
Patient said his legs are hurting at night and wanting to know what to do for them. Pharm on file is still correct.Thanks!

## 2019-04-24 NOTE — Telephone Encounter (Signed)
Patient is requesting a call back. He stated he is needing instructions on how to take the Gabapentin.  He called Dr Doristine Devoid office and they advised him to give our office a call about switching the medications

## 2019-04-25 ENCOUNTER — Telehealth: Payer: Self-pay | Admitting: Neurology

## 2019-04-25 DIAGNOSIS — I350 Nonrheumatic aortic (valve) stenosis: Secondary | ICD-10-CM | POA: Diagnosis not present

## 2019-04-25 DIAGNOSIS — R1311 Dysphagia, oral phase: Secondary | ICD-10-CM | POA: Diagnosis not present

## 2019-04-25 DIAGNOSIS — K219 Gastro-esophageal reflux disease without esophagitis: Secondary | ICD-10-CM | POA: Diagnosis not present

## 2019-04-25 DIAGNOSIS — F0280 Dementia in other diseases classified elsewhere without behavioral disturbance: Secondary | ICD-10-CM | POA: Diagnosis not present

## 2019-04-25 DIAGNOSIS — G4752 REM sleep behavior disorder: Secondary | ICD-10-CM | POA: Diagnosis not present

## 2019-04-25 DIAGNOSIS — G20 Parkinson's disease: Secondary | ICD-10-CM | POA: Diagnosis not present

## 2019-04-25 NOTE — Telephone Encounter (Signed)
Spoke with home health therapist she states that patient is having hard time with DBS devices. I informed her that I spoke with patient wife at 10:49 am today and gave her the number to the [employer] rep to call.

## 2019-04-25 NOTE — Telephone Encounter (Signed)
He can try the 300mg  at bedtime--but be careful in the morning. I would not recommend that dose during the day. He should contact us to let us know how the 300mg  at bedtime is working (in the next week or 2)

## 2019-04-25 NOTE — Telephone Encounter (Signed)
Spoke to pt. He said he has an appt in the next week or 2 and will update [place] then.

## 2019-04-25 NOTE — Telephone Encounter (Signed)
Dierdre is calling in from home health wanting to speak with you about this patient. She said she needs a call back ASAP. Thanks!

## 2019-04-25 NOTE — Telephone Encounter (Signed)
Patient said that the DBS is not working right. He is not getting a signal and he hasn't noticed any difference. He cant get info from the signal. Thanks!

## 2019-04-25 NOTE — Telephone Encounter (Signed)
He should have a number to a [employer] rep?  They can walk him through it.  [phone] is rep number if he doesn't have it

## 2019-04-25 NOTE — Telephone Encounter (Signed)
Spoke to pt. He said his neighbor, Dr Sabra Heck, wrote him a prescription of gabapentin 300mg  because he was in so much pain. But, they told him to call Dr Tat to figure out how and when he should take it.

## 2019-04-25 NOTE — Telephone Encounter (Signed)
Please advise 

## 2019-04-25 NOTE — Telephone Encounter (Signed)
Spoke with patient spouse she was given number to DBS Rep

## 2019-04-26 DIAGNOSIS — F0280 Dementia in other diseases classified elsewhere without behavioral disturbance: Secondary | ICD-10-CM | POA: Diagnosis not present

## 2019-04-26 DIAGNOSIS — G4752 REM sleep behavior disorder: Secondary | ICD-10-CM | POA: Diagnosis not present

## 2019-04-26 DIAGNOSIS — R1311 Dysphagia, oral phase: Secondary | ICD-10-CM | POA: Diagnosis not present

## 2019-04-26 DIAGNOSIS — K219 Gastro-esophageal reflux disease without esophagitis: Secondary | ICD-10-CM | POA: Diagnosis not present

## 2019-04-26 DIAGNOSIS — I350 Nonrheumatic aortic (valve) stenosis: Secondary | ICD-10-CM | POA: Diagnosis not present

## 2019-04-26 DIAGNOSIS — G20 Parkinson's disease: Secondary | ICD-10-CM | POA: Diagnosis not present

## 2019-04-27 ENCOUNTER — Ambulatory Visit (INDEPENDENT_AMBULATORY_CARE_PROVIDER_SITE_OTHER): Payer: Medicare Other | Admitting: Internal Medicine

## 2019-04-27 ENCOUNTER — Telehealth: Payer: Self-pay

## 2019-04-27 ENCOUNTER — Encounter: Payer: Self-pay | Admitting: Internal Medicine

## 2019-04-27 ENCOUNTER — Ambulatory Visit: Payer: Medicare Other | Admitting: Internal Medicine

## 2019-04-27 ENCOUNTER — Other Ambulatory Visit: Payer: Self-pay

## 2019-04-27 DIAGNOSIS — M79604 Pain in right leg: Secondary | ICD-10-CM | POA: Diagnosis not present

## 2019-04-27 DIAGNOSIS — K219 Gastro-esophageal reflux disease without esophagitis: Secondary | ICD-10-CM | POA: Diagnosis not present

## 2019-04-27 DIAGNOSIS — G20 Parkinson's disease: Secondary | ICD-10-CM | POA: Diagnosis not present

## 2019-04-27 DIAGNOSIS — I350 Nonrheumatic aortic (valve) stenosis: Secondary | ICD-10-CM | POA: Diagnosis not present

## 2019-04-27 DIAGNOSIS — M79605 Pain in left leg: Secondary | ICD-10-CM | POA: Diagnosis not present

## 2019-04-27 DIAGNOSIS — R1311 Dysphagia, oral phase: Secondary | ICD-10-CM | POA: Diagnosis not present

## 2019-04-27 DIAGNOSIS — G4752 REM sleep behavior disorder: Secondary | ICD-10-CM | POA: Diagnosis not present

## 2019-04-27 DIAGNOSIS — R0902 Hypoxemia: Secondary | ICD-10-CM | POA: Diagnosis not present

## 2019-04-27 DIAGNOSIS — F0280 Dementia in other diseases classified elsewhere without behavioral disturbance: Secondary | ICD-10-CM | POA: Diagnosis not present

## 2019-04-27 NOTE — Telephone Encounter (Signed)
Deidra speech therapist with [facility] is with pt; today pulse ox is 85-90 % and is usually between 96-98%. Pt has been moving around some but not a lot this morning. BP 98/48 after approx 10 ' recked BP 81/45. Pt has been falling asleep more often than usual but was without DBS (deep brain stimulator) for 1 1/2 wks. Just got DBS to working on 04/26/19.pt is not on any BP meds. No H/A,dizziness,CP or SOB.  Pt did pass out 1 wk ago but EMT's came out cked pt and pt did not go to ED. Only symptoms today is feeling tired and sleeping more than usual. Pt wants to see Dr Silvio Pate. Pt scheduled appt with Dr Silvio Pate today at 3:45. Dr Silvio Pate not in office this morning and Dr Glori Bickers said BP can run low with parkinson's; if pt does not have any symptoms dizziness, H/A, CP or SOB or if BP does not continue to drop and pulse ox remains steady pt can keep appt with Dr Silvio Pate but any symptoms noted pt should go to ED. Deidra voiced understanding and will let pt know. FYI to Dr Silvio Pate.

## 2019-04-27 NOTE — Assessment & Plan Note (Signed)
Increasing functional needs Extensive discussion about moving into environment where services are available (like [place])

## 2019-04-27 NOTE — Assessment & Plan Note (Signed)
Seems to be radicular of spinal stenosis ?some better with gabapentin Will adjust dosing

## 2019-04-27 NOTE — Patient Instructions (Signed)
Please stop the cyclobenzaprine for good. Only use the clonazepam at night----if you can't sleep after the gabapentin. For now, okay to continue the 300mg  gabapentin capsule at night---but if you are still sleepy, etc in the day on this--we should cut the dose. If this helps some, but you are still having pain in your legs in the day, we can consider adding small doses during the day.

## 2019-04-27 NOTE — Progress Notes (Signed)
Subjective:    Patient ID: Casey Tucker, male    DOB: 12-Sep-1940, 78 y.o.   MRN: [id number]  HPI Here due to lethargy and low O2 sats at home Wife and daughter [name] are here See phone note  Did start the gabapentin 2-3 days ago Started the 300mg  dose He has noted some improvement even in day---but daughter reminds him he was in bad pain today  Also takes the clonazepam every night  No chest pain  No SOB No fever or cough Not really dizzy  Current Outpatient Medications on File Prior to Visit  Medication Sig Dispense Refill  . carbidopa-levodopa (SINEMET IR) 25-100 MG tablet 2 at 7:30am, 1 at 10am, 1 at 1pm, 1 at 4pm, 1 at 7pm 540 tablet 1  . clonazePAM (KLONOPIN) 0.5 MG tablet TAKE 1 TO 2 TABLETS AT BEDTIME AS NEEDED 60 tablet 0  . cyclobenzaprine (FLEXERIL) 10 MG tablet Take 2 tablets (20 mg total) by mouth at bedtime. 60 tablet 5  . entacapone (COMTAN) 200 MG tablet Take 1 tablet (200 mg total) by mouth 5 (five) times daily. Take with Levodopa 450 tablet 1  . gabapentin (NEURONTIN) 100 MG capsule Take 1-2 capsules (100-200 mg total) by mouth at bedtime. Increase as directed 90 capsule 0   No current facility-administered medications on file prior to visit.     Allergies  Allergen Reactions  . Amoxicillin   . Celebrex [Celecoxib]   . Demerol Other (See Comments)    Hallucinations   . Penicillins     REACTION: RASH    Past Medical History:  Diagnosis Date  . Arthritis    left hip   . GERD (gastroesophageal reflux disease) Pre 2002   Mild  . History of kidney stones   . 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 Parkinson disease ([place]) 2004   Slowly progressive  . Ruptured appendix teens    Past Surgical History:  Procedure Laterality Date  . APPENDECTOMY    . CATARACT EXTRACTION W/ INTRAOCULAR LENS  IMPLANT, BILATERAL  2017  . DEEP BRAIN STIMULATOR PLACEMENT  8/14   L STN  .  DOPPLER ECHOCARDIOGRAPHY  01/10/2009   LV NML Mild LVH EF 60-65% aortic sclerosis w/0 stenosis   . JOINT REPLACEMENT     left hip replacement 08/14/11/[place]   . SUBTHALAMIC STIMULATOR BATTERY REPLACEMENT Left 03/25/2017   Procedure: Deep Brain Stimulator battery replacement;  Surgeon: Erline Levine, MD;  Location: [facility];  Service: Neurosurgery;  Laterality: Left;  left  . TONSILLECTOMY    . TOTAL HIP REVISION  08/27/2011   Procedure: TOTAL HIP REVISION;  Surgeon: Mauri Pole, MD;  Location: [facility];  Service: Orthopedics;  Laterality: Left;    Family History  Problem Relation Age of Onset  . Arthritis Mother        knee replacement  . COPD Father        emphysema, smoker  . Heart disease Father        CHF  . Healthy Sister   . Alcohol abuse Paternal Uncle   . Rheum arthritis Daughter     Social History   Socioeconomic History  . Marital status: Married    Spouse name: Not on file  . Number of children: 2  . Years of education: Not on file  . Highest education level: Doctorate  Occupational History  . Occupation: Optometrist    Comment: medically retired  Social Needs  . Financial resource strain: Not on file  . Food insecurity    Worry: Not on file    Inability: Not on file  . Transportation needs    Medical: Not on file    Non-medical: Not on file  Tobacco Use  . Smoking status: Never Smoker  . Smokeless tobacco: Never Used  Substance and Sexual Activity  . Alcohol use: Yes    Alcohol/week: 7.0 standard drinks    Types: 7 Standard drinks or equivalent per week    Comment: occassionally  . Drug use: No  . Sexual activity: Yes  Lifestyle  . Physical activity    Days per week: Not on file    Minutes per session: Not on file  . Stress: Not on file  Relationships  . Social Herbalist on phone: Not on file    Gets together: Not on file    Attends religious service: Not on file    Active member of club or organization: Not on file    Attends  meetings of clubs or organizations: Not on file    Relationship status: Not on file  . Intimate partner violence    Fear of current or ex partner: Not on file    Emotionally abused: Not on file    Physically abused: Not on file    Forced sexual activity: Not on file  Other Topics Concern  . Not on file  Social History Narrative   Married, lives with wife   2 daughters      Has living will   Wife has health care POA---then daughters   Would still accept CPR--but no prolonged artificial means (ventilator or tube feeds)   Review of Systems  Eating okay Has gained some weight      Objective:   Physical Exam  Constitutional: No distress.  Neck: No thyromegaly present.  Cardiovascular: Normal rate, regular rhythm and normal heart sounds. Exam reveals no gallop.  No murmur heard. Respiratory: Effort normal and breath sounds normal. No respiratory distress. He has no wheezes. He has no rales.  GI: Soft. There is no abdominal tenderness.  Musculoskeletal:        General: No edema.  Lymphadenopathy:    He has no cervical adenopathy.  Neurological:  Still with leg wasting  Psychiatric:  Clear headed now           Assessment & Plan:

## 2019-04-27 NOTE — Telephone Encounter (Signed)
This may be due to the gabapentin if he started it. Can review at OV and consider decrease (I think he may have started 300mg  which may be too much)

## 2019-04-27 NOTE — Assessment & Plan Note (Signed)
And sedation Seems to be related to the medications Will stop the cyclobenzaprine Make the clonazepam prn only Continue the gabapentin 300mg  at bedtime for now--but may need to cut back if the sedation persists

## 2019-04-28 DIAGNOSIS — F0280 Dementia in other diseases classified elsewhere without behavioral disturbance: Secondary | ICD-10-CM | POA: Diagnosis not present

## 2019-04-28 DIAGNOSIS — K219 Gastro-esophageal reflux disease without esophagitis: Secondary | ICD-10-CM | POA: Diagnosis not present

## 2019-04-28 DIAGNOSIS — R1311 Dysphagia, oral phase: Secondary | ICD-10-CM | POA: Diagnosis not present

## 2019-04-28 DIAGNOSIS — I350 Nonrheumatic aortic (valve) stenosis: Secondary | ICD-10-CM | POA: Diagnosis not present

## 2019-04-28 DIAGNOSIS — G4752 REM sleep behavior disorder: Secondary | ICD-10-CM | POA: Diagnosis not present

## 2019-04-28 DIAGNOSIS — G20 Parkinson's disease: Secondary | ICD-10-CM | POA: Diagnosis not present

## 2019-05-02 ENCOUNTER — Telehealth: Payer: Self-pay

## 2019-05-02 ENCOUNTER — Telehealth: Payer: Self-pay | Admitting: Neurology

## 2019-05-02 DIAGNOSIS — K219 Gastro-esophageal reflux disease without esophagitis: Secondary | ICD-10-CM | POA: Diagnosis not present

## 2019-05-02 DIAGNOSIS — I350 Nonrheumatic aortic (valve) stenosis: Secondary | ICD-10-CM | POA: Diagnosis not present

## 2019-05-02 DIAGNOSIS — G20 Parkinson's disease: Secondary | ICD-10-CM | POA: Diagnosis not present

## 2019-05-02 DIAGNOSIS — F0280 Dementia in other diseases classified elsewhere without behavioral disturbance: Secondary | ICD-10-CM | POA: Diagnosis not present

## 2019-05-02 DIAGNOSIS — G4752 REM sleep behavior disorder: Secondary | ICD-10-CM | POA: Diagnosis not present

## 2019-05-02 DIAGNOSIS — R1311 Dysphagia, oral phase: Secondary | ICD-10-CM | POA: Diagnosis not present

## 2019-05-02 NOTE — Telephone Encounter (Signed)
I spoke to [employer] rep.  His device is on and working but Casey Tucker doesn't know how to work remote.  Sounds like pt may have accidentally turned up the voltage and doesn't know how to turn it back down.  The [employer] rep can come next Thursday afternoon (22nd) and look at patients device.  Perhaps early afternoon but will need to confirm with rep

## 2019-05-02 NOTE — Telephone Encounter (Signed)
Please patient needs in person appt to check device setting? Does appt need to be 60 mins?

## 2019-05-02 NOTE — Telephone Encounter (Signed)
Spoke with patient spouse we reviewed his medications times/ doses. Also gave her the number to [employer] rep regarding DBS devices.

## 2019-05-02 NOTE — Telephone Encounter (Signed)
Received voice mail from [place] from [facility]  Requesting verbal orders to extend speech therapy for 2 times a week for week.  Called no answer verbal approval left on voice mail.  She also mention patient having fall last night getting out of the bath. Minor pain in right knee. Pt spouse states that he was unresponsive for about 7-10 seconds but is better now.  Called spoke with patient spouse no head injuries. Dr. Sabra Heck Orthopedic surgeon is his neighbors he came over to evaluate him and stayed for a while.

## 2019-05-02 NOTE — Telephone Encounter (Signed)
Spoke with [employer] rep.  He can meet patient at our office at [place] on 10/22.  Please let pt know

## 2019-05-02 NOTE — Telephone Encounter (Signed)
Patient called in today regarding having spoke to the [employer] Rep. They are wanting him seen as soon as possible. Mr. Casey Tucker said that "it's not working". The next available 6 Min spot would be in March. Is there a sooner appointment time? Please Advise. Thanks

## 2019-05-03 NOTE — Telephone Encounter (Signed)
Sending to clinical staff for review: Okay to sign/close encounter or is further follow up needed? ° °

## 2019-05-03 NOTE — Telephone Encounter (Signed)
YES

## 2019-05-03 NOTE — Telephone Encounter (Signed)
Pt spouse aware and will have him here. Do we need to place him on your schedule for this day/time?

## 2019-05-03 NOTE — Telephone Encounter (Signed)
No as that would create an encounter.  Just let the front staff know to expect him

## 2019-05-03 NOTE — Telephone Encounter (Signed)
NOTED

## 2019-05-04 DIAGNOSIS — G20 Parkinson's disease: Secondary | ICD-10-CM | POA: Diagnosis not present

## 2019-05-04 DIAGNOSIS — F0280 Dementia in other diseases classified elsewhere without behavioral disturbance: Secondary | ICD-10-CM | POA: Diagnosis not present

## 2019-05-04 DIAGNOSIS — R1311 Dysphagia, oral phase: Secondary | ICD-10-CM | POA: Diagnosis not present

## 2019-05-04 DIAGNOSIS — G4752 REM sleep behavior disorder: Secondary | ICD-10-CM | POA: Diagnosis not present

## 2019-05-04 DIAGNOSIS — K219 Gastro-esophageal reflux disease without esophagitis: Secondary | ICD-10-CM | POA: Diagnosis not present

## 2019-05-04 DIAGNOSIS — I350 Nonrheumatic aortic (valve) stenosis: Secondary | ICD-10-CM | POA: Diagnosis not present

## 2019-05-05 ENCOUNTER — Telehealth: Payer: Self-pay

## 2019-05-05 ENCOUNTER — Other Ambulatory Visit: Payer: Self-pay

## 2019-05-05 DIAGNOSIS — F0280 Dementia in other diseases classified elsewhere without behavioral disturbance: Secondary | ICD-10-CM | POA: Diagnosis not present

## 2019-05-05 DIAGNOSIS — K219 Gastro-esophageal reflux disease without esophagitis: Secondary | ICD-10-CM | POA: Diagnosis not present

## 2019-05-05 DIAGNOSIS — R1311 Dysphagia, oral phase: Secondary | ICD-10-CM | POA: Diagnosis not present

## 2019-05-05 DIAGNOSIS — I350 Nonrheumatic aortic (valve) stenosis: Secondary | ICD-10-CM | POA: Diagnosis not present

## 2019-05-05 DIAGNOSIS — G20 Parkinson's disease: Secondary | ICD-10-CM | POA: Diagnosis not present

## 2019-05-05 DIAGNOSIS — G4752 REM sleep behavior disorder: Secondary | ICD-10-CM | POA: Diagnosis not present

## 2019-05-05 DIAGNOSIS — Z96642 Presence of left artificial hip joint: Secondary | ICD-10-CM | POA: Diagnosis not present

## 2019-05-05 MED ORDER — ENTACAPONE 200 MG PO TABS: 200.0000 mg | ORAL_TABLET | Freq: Every day | ORAL | 1 refills | Status: DC

## 2019-05-05 NOTE — Telephone Encounter (Signed)
Requested Prescriptions   Pending Prescriptions Disp Refills  . entacapone (COMTAN) 200 MG tablet 450 tablet 1    Sig: Take 1 tablet (200 mg total) by mouth 5 (five) times daily. Take with Levodopa   Rx last filled:01/09/19 #450 1 refills  Pt last seen:03/30/19  Follow up appt scheduled:09/11/2019

## 2019-05-05 NOTE — Telephone Encounter (Signed)
Shawn, PT at [facility] requesting verbal orders to visit for 2 times a week for 3 weeks.  Pt had some missed visit requesting to started visit the week ok 05/03/19.  Called no answer given verbal orders for visit.

## 2019-05-08 DIAGNOSIS — I350 Nonrheumatic aortic (valve) stenosis: Secondary | ICD-10-CM | POA: Diagnosis not present

## 2019-05-08 DIAGNOSIS — K219 Gastro-esophageal reflux disease without esophagitis: Secondary | ICD-10-CM | POA: Diagnosis not present

## 2019-05-08 DIAGNOSIS — F0280 Dementia in other diseases classified elsewhere without behavioral disturbance: Secondary | ICD-10-CM | POA: Diagnosis not present

## 2019-05-08 DIAGNOSIS — G4752 REM sleep behavior disorder: Secondary | ICD-10-CM | POA: Diagnosis not present

## 2019-05-08 DIAGNOSIS — R1311 Dysphagia, oral phase: Secondary | ICD-10-CM | POA: Diagnosis not present

## 2019-05-08 DIAGNOSIS — G20 Parkinson's disease: Secondary | ICD-10-CM | POA: Diagnosis not present

## 2019-05-09 DIAGNOSIS — R1311 Dysphagia, oral phase: Secondary | ICD-10-CM | POA: Diagnosis not present

## 2019-05-09 DIAGNOSIS — F0280 Dementia in other diseases classified elsewhere without behavioral disturbance: Secondary | ICD-10-CM | POA: Diagnosis not present

## 2019-05-09 DIAGNOSIS — K219 Gastro-esophageal reflux disease without esophagitis: Secondary | ICD-10-CM | POA: Diagnosis not present

## 2019-05-09 DIAGNOSIS — G20 Parkinson's disease: Secondary | ICD-10-CM | POA: Diagnosis not present

## 2019-05-09 DIAGNOSIS — I350 Nonrheumatic aortic (valve) stenosis: Secondary | ICD-10-CM | POA: Diagnosis not present

## 2019-05-09 DIAGNOSIS — G4752 REM sleep behavior disorder: Secondary | ICD-10-CM | POA: Diagnosis not present

## 2019-05-10 ENCOUNTER — Encounter: Payer: Self-pay | Admitting: Internal Medicine

## 2019-05-10 ENCOUNTER — Ambulatory Visit (INDEPENDENT_AMBULATORY_CARE_PROVIDER_SITE_OTHER): Payer: Medicare Other | Admitting: Internal Medicine

## 2019-05-10 ENCOUNTER — Other Ambulatory Visit: Payer: Self-pay

## 2019-05-10 DIAGNOSIS — G20 Parkinson's disease: Secondary | ICD-10-CM | POA: Diagnosis not present

## 2019-05-10 DIAGNOSIS — M48062 Spinal stenosis, lumbar region with neurogenic claudication: Secondary | ICD-10-CM

## 2019-05-10 NOTE — Assessment & Plan Note (Signed)
Has increasing care needs Discussed arranging for daily caregiver---and supervision with meds (neither he nor wife seem to have firm grip on the meds)

## 2019-05-10 NOTE — Progress Notes (Signed)
Subjective:    Patient ID: Casey Tucker, male    DOB: 1941/07/03, 78 y.o.   MRN: [id number]  HPI Here with wife and daughter  Only taking the gabapentin as needed He is unsure how often he is taking it Has still been using the clonazepam  Did fall 4 days ago--getting up to void at night No sig injury  Is getting PT and OT now Has part time aide as well--discussed needing to increase the time They are really not ready to move to a facility  Current Outpatient Medications on File Prior to Visit  Medication Sig Dispense Refill  . carbidopa-levodopa (SINEMET IR) 25-100 MG tablet 2 at 7:30am, 1 at 10am, 1 at 1pm, 1 at 4pm, 1 at 7pm 540 tablet 1  . clonazePAM (KLONOPIN) 0.5 MG tablet TAKE 1 TO 2 TABLETS AT BEDTIME AS NEEDED 60 tablet 0  . entacapone (COMTAN) 200 MG tablet Take 1 tablet (200 mg total) by mouth 5 (five) times daily. Take with Levodopa 450 tablet 1  . gabapentin (NEURONTIN) 100 MG capsule Take 1-2 capsules (100-200 mg total) by mouth at bedtime. Increase as directed 90 capsule 0   No current facility-administered medications on file prior to visit.     Allergies  Allergen Reactions  . Amoxicillin   . Celebrex [Celecoxib]   . Demerol Other (See Comments)    Hallucinations   . Penicillins     REACTION: RASH    Past Medical History:  Diagnosis Date  . Arthritis    left hip   . GERD (gastroesophageal reflux disease) Pre 2002   Mild  . History of kidney stones   . 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 Parkinson disease ([place]) 2004   Slowly progressive  . Ruptured appendix teens    Past Surgical History:  Procedure Laterality Date  . APPENDECTOMY    . CATARACT EXTRACTION W/ INTRAOCULAR LENS  IMPLANT, BILATERAL  2017  . DEEP BRAIN STIMULATOR PLACEMENT  8/14   L STN  . DOPPLER ECHOCARDIOGRAPHY  01/10/2009   LV NML Mild LVH EF 60-65% aortic sclerosis w/0 stenosis   . JOINT  REPLACEMENT     left hip replacement 08/14/11/[place]   . SUBTHALAMIC STIMULATOR BATTERY REPLACEMENT Left 03/25/2017   Procedure: Deep Brain Stimulator battery replacement;  Surgeon: Erline Levine, MD;  Location: [facility];  Service: Neurosurgery;  Laterality: Left;  left  . TONSILLECTOMY    . TOTAL HIP REVISION  08/27/2011   Procedure: TOTAL HIP REVISION;  Surgeon: Mauri Pole, MD;  Location: [facility];  Service: Orthopedics;  Laterality: Left;    Family History  Problem Relation Age of Onset  . Arthritis Mother        knee replacement  . COPD Father        emphysema, smoker  . Heart disease Father        CHF  . Healthy Sister   . Alcohol abuse Paternal Uncle   . Rheum arthritis Daughter     Social History   Socioeconomic History  . Marital status: Married    Spouse name: Not on file  . Number of children: 2  . Years of education: Not on file  . Highest education level: Doctorate  Occupational History  . 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 Financial resource strain: Not on file  . Food insecurity    Worry: Not  on file    Inability: Not on file  . Transportation needs    Medical: Not on file    Non-medical: Not on file  Tobacco Use  . Smoking status: Never Smoker  . Smokeless tobacco: Never Used  Substance and Sexual Activity  . Alcohol use: Yes    Alcohol/week: 7.0 standard drinks    Types: 7 Standard drinks or equivalent per week    Comment: occassionally  . Drug use: No  . Sexual activity: Yes  Lifestyle  . Physical activity    Days per week: Not on file    Minutes per session: Not on file  . Stress: Not on file  Relationships  . Social Herbalist on phone: Not on file    Gets together: Not on file    Attends religious service: Not on file    Active member of club or organization: Not on file    Attends meetings of clubs or organizations: Not on file    Relationship status: Not on file  . Intimate partner violence     Fear of current or ex partner: Not on file    Emotionally abused: Not on file    Physically abused: Not on file    Forced sexual activity: Not on file  Other Topics Concern  . Not on file  Social History Narrative   Married, lives with wife   2 daughters      Has living will   Wife has health care POA---then daughters   Would still accept CPR--but no prolonged artificial means (ventilator or tube feeds)   Review of Systems Eating fair Weight is stable    Objective:   Physical Exam  Constitutional: No distress.  Neurological:  Seems more comfortable Same bradykinesia  Psychiatric:  Speech is unclear but understandable           Assessment & Plan:

## 2019-05-10 NOTE — Assessment & Plan Note (Signed)
Seems to be helped by the gabapentin but we have to get the schedule down (and some cognitive issues are affecting his med use)

## 2019-05-10 NOTE — Patient Instructions (Signed)
Please take the gabapentin 100mg  every night---and you can take another one in the early afternoon (as long as it doesn't make you too tired). Try to save the clonazepam for if you are having a really tough night--otherwise try not taking it.

## 2019-05-11 DIAGNOSIS — G4752 REM sleep behavior disorder: Secondary | ICD-10-CM | POA: Diagnosis not present

## 2019-05-11 DIAGNOSIS — G20 Parkinson's disease: Secondary | ICD-10-CM | POA: Diagnosis not present

## 2019-05-11 DIAGNOSIS — K219 Gastro-esophageal reflux disease without esophagitis: Secondary | ICD-10-CM | POA: Diagnosis not present

## 2019-05-11 DIAGNOSIS — R1311 Dysphagia, oral phase: Secondary | ICD-10-CM | POA: Diagnosis not present

## 2019-05-11 DIAGNOSIS — F0280 Dementia in other diseases classified elsewhere without behavioral disturbance: Secondary | ICD-10-CM | POA: Diagnosis not present

## 2019-05-11 DIAGNOSIS — I350 Nonrheumatic aortic (valve) stenosis: Secondary | ICD-10-CM | POA: Diagnosis not present

## 2019-05-12 ENCOUNTER — Telehealth: Payer: Self-pay | Admitting: Neurology

## 2019-05-12 DIAGNOSIS — G4752 REM sleep behavior disorder: Secondary | ICD-10-CM | POA: Diagnosis not present

## 2019-05-12 DIAGNOSIS — I350 Nonrheumatic aortic (valve) stenosis: Secondary | ICD-10-CM | POA: Diagnosis not present

## 2019-05-12 DIAGNOSIS — F0280 Dementia in other diseases classified elsewhere without behavioral disturbance: Secondary | ICD-10-CM | POA: Diagnosis not present

## 2019-05-12 DIAGNOSIS — K219 Gastro-esophageal reflux disease without esophagitis: Secondary | ICD-10-CM | POA: Diagnosis not present

## 2019-05-12 DIAGNOSIS — R1311 Dysphagia, oral phase: Secondary | ICD-10-CM | POA: Diagnosis not present

## 2019-05-12 DIAGNOSIS — G20 Parkinson's disease: Secondary | ICD-10-CM | POA: Diagnosis not present

## 2019-05-12 NOTE — Telephone Encounter (Signed)
Spoke with Casey Tucker she states that left heel pressure sore. She states that she will contact PCP for wound care orders.  She states regarding PT requesting extend 2 mores weeks for PT pt not responding like he was in beginning due to his anxiety.   Verbal order to extend PT given.

## 2019-05-12 NOTE — Telephone Encounter (Signed)
Liji from [facility] needs to speak to someone about orders for home health nurse to go out on 05-16-19. She also states that the patient is having pressure on the heel

## 2019-05-15 ENCOUNTER — Telehealth: Payer: Self-pay

## 2019-05-15 DIAGNOSIS — I350 Nonrheumatic aortic (valve) stenosis: Secondary | ICD-10-CM | POA: Diagnosis not present

## 2019-05-15 DIAGNOSIS — G20 Parkinson's disease: Secondary | ICD-10-CM | POA: Diagnosis not present

## 2019-05-15 DIAGNOSIS — K219 Gastro-esophageal reflux disease without esophagitis: Secondary | ICD-10-CM | POA: Diagnosis not present

## 2019-05-15 DIAGNOSIS — G4752 REM sleep behavior disorder: Secondary | ICD-10-CM | POA: Diagnosis not present

## 2019-05-15 DIAGNOSIS — R1311 Dysphagia, oral phase: Secondary | ICD-10-CM | POA: Diagnosis not present

## 2019-05-15 DIAGNOSIS — F0280 Dementia in other diseases classified elsewhere without behavioral disturbance: Secondary | ICD-10-CM | POA: Diagnosis not present

## 2019-05-15 NOTE — Telephone Encounter (Signed)
Receive voice mail call from [place] from [facility] requesting verbal orders to extend ST for twice a week for 1 week and PT for twice a week for 2 weeks.  Called her back no answer left voice mail with verbal order approvals.

## 2019-05-15 NOTE — Telephone Encounter (Signed)
Okay Sounds like he may need reevaluation here as well (if it is inflamed)

## 2019-05-15 NOTE — Telephone Encounter (Signed)
Lege PT with [facility] left v/m requesting verbal orders for [facility] nurse to eval and treat wound on lt lower extremity in the heel area; started out as pressure sore with fluid; a blister like area that is now red also.Please advise.

## 2019-05-15 NOTE — Telephone Encounter (Signed)
Spoke to [place]. She will have the nurse evaluate it and then decide if he needs to be seen.

## 2019-05-16 ENCOUNTER — Telehealth: Payer: Self-pay | Admitting: Neurology

## 2019-05-16 NOTE — Telephone Encounter (Signed)
Ok (don't think that there is probably much choice if the ST is out)

## 2019-05-16 NOTE — Telephone Encounter (Signed)
Rhonda from [facility] left msg about patient having speech for home health - the speech therapist has to be out for a week and a half. Is this ok to hold off on?  Please call her back and let her know. Thanks!

## 2019-05-16 NOTE — Telephone Encounter (Signed)
Rhonda at [facility] advised

## 2019-05-16 NOTE — Telephone Encounter (Signed)
Please advise okay to hold on speech therapy

## 2019-05-17 ENCOUNTER — Telehealth: Payer: Self-pay

## 2019-05-17 NOTE — Telephone Encounter (Signed)
Pt having pain in head and lower extremities; pt is not eating a lot; pt lower legs hurting more ; no swelling or redness in lower legs. Tylenol 2 tabs has not had time to take effect yet;pt took tylenol around 4 PM.pt had pain like this along time ago with DVT. Pt does not want to go to UC or ED and will see how he does. Pt said to send the note to Dr Silvio Pate and see if he has further instructions or suggestions. UC& ED precautions given to pt and pt voiced understanding.

## 2019-05-18 DIAGNOSIS — G20 Parkinson's disease: Secondary | ICD-10-CM | POA: Diagnosis not present

## 2019-05-18 DIAGNOSIS — R1311 Dysphagia, oral phase: Secondary | ICD-10-CM | POA: Diagnosis not present

## 2019-05-18 DIAGNOSIS — F0280 Dementia in other diseases classified elsewhere without behavioral disturbance: Secondary | ICD-10-CM | POA: Diagnosis not present

## 2019-05-18 DIAGNOSIS — G4752 REM sleep behavior disorder: Secondary | ICD-10-CM | POA: Diagnosis not present

## 2019-05-18 DIAGNOSIS — I350 Nonrheumatic aortic (valve) stenosis: Secondary | ICD-10-CM | POA: Diagnosis not present

## 2019-05-18 DIAGNOSIS — K219 Gastro-esophageal reflux disease without esophagitis: Secondary | ICD-10-CM | POA: Diagnosis not present

## 2019-05-18 NOTE — Telephone Encounter (Signed)
Spoke to pt. He said it is better today. Had his neighbor, who is an orthopedic specialist, look at it last night.

## 2019-05-18 NOTE — Telephone Encounter (Signed)
Please check on him today Probably no reason to think the pain is DVT---as long as it is better, no action. I would add him on at end of day if he felt he needed to be seen

## 2019-05-19 ENCOUNTER — Telehealth: Payer: Self-pay | Admitting: Neurology

## 2019-05-19 NOTE — Telephone Encounter (Signed)
Casey Tucker - PT with [facility] called and stated patient was not feeling well today and cancelled his PT appt. Casey Tucker stated this will be a missed visit and not made up due to the PT's schedule. Patient will be seen next week 2x.

## 2019-05-19 NOTE — Telephone Encounter (Signed)
Ann from [facility] home [facility] is needing verbal orders on patient- Skilled nursing 1 week 1, 2 week 1, and 2 week 3. Thanks!

## 2019-05-19 NOTE — Telephone Encounter (Signed)
Provider stated any patients she send to treatment to approve the verbal orders. Ann at [facility] is aware

## 2019-05-22 DIAGNOSIS — G4752 REM sleep behavior disorder: Secondary | ICD-10-CM | POA: Diagnosis not present

## 2019-05-22 DIAGNOSIS — G20 Parkinson's disease: Secondary | ICD-10-CM | POA: Diagnosis not present

## 2019-05-22 DIAGNOSIS — F0280 Dementia in other diseases classified elsewhere without behavioral disturbance: Secondary | ICD-10-CM | POA: Diagnosis not present

## 2019-05-22 DIAGNOSIS — K219 Gastro-esophageal reflux disease without esophagitis: Secondary | ICD-10-CM | POA: Diagnosis not present

## 2019-05-22 DIAGNOSIS — R1311 Dysphagia, oral phase: Secondary | ICD-10-CM | POA: Diagnosis not present

## 2019-05-22 DIAGNOSIS — I350 Nonrheumatic aortic (valve) stenosis: Secondary | ICD-10-CM | POA: Diagnosis not present

## 2019-05-23 DIAGNOSIS — R1311 Dysphagia, oral phase: Secondary | ICD-10-CM | POA: Diagnosis not present

## 2019-05-23 DIAGNOSIS — G20 Parkinson's disease: Secondary | ICD-10-CM | POA: Diagnosis not present

## 2019-05-23 DIAGNOSIS — F0280 Dementia in other diseases classified elsewhere without behavioral disturbance: Secondary | ICD-10-CM | POA: Diagnosis not present

## 2019-05-23 DIAGNOSIS — K219 Gastro-esophageal reflux disease without esophagitis: Secondary | ICD-10-CM | POA: Diagnosis not present

## 2019-05-23 DIAGNOSIS — I350 Nonrheumatic aortic (valve) stenosis: Secondary | ICD-10-CM | POA: Diagnosis not present

## 2019-05-23 DIAGNOSIS — G4752 REM sleep behavior disorder: Secondary | ICD-10-CM | POA: Diagnosis not present

## 2019-05-24 ENCOUNTER — Telehealth: Payer: Self-pay | Admitting: *Deleted

## 2019-05-24 DIAGNOSIS — R1311 Dysphagia, oral phase: Secondary | ICD-10-CM | POA: Diagnosis not present

## 2019-05-24 DIAGNOSIS — G20 Parkinson's disease: Secondary | ICD-10-CM | POA: Diagnosis not present

## 2019-05-24 DIAGNOSIS — K219 Gastro-esophageal reflux disease without esophagitis: Secondary | ICD-10-CM | POA: Diagnosis not present

## 2019-05-24 DIAGNOSIS — G4752 REM sleep behavior disorder: Secondary | ICD-10-CM | POA: Diagnosis not present

## 2019-05-24 DIAGNOSIS — F0280 Dementia in other diseases classified elsewhere without behavioral disturbance: Secondary | ICD-10-CM | POA: Diagnosis not present

## 2019-05-24 DIAGNOSIS — I350 Nonrheumatic aortic (valve) stenosis: Secondary | ICD-10-CM | POA: Diagnosis not present

## 2019-05-24 NOTE — Telephone Encounter (Signed)
There are artificial saliva preparations that I think are over the counter

## 2019-05-24 NOTE — Telephone Encounter (Signed)
Spoke to [employer]. She will advise the family.

## 2019-05-24 NOTE — Telephone Encounter (Signed)
Deidra with [facility] left a voicemail stating that patient is complaining with a dry mouth. Deidra stated that this can impact swallow function. Deidra wants to know if Dr. Silvio Pate can sent a prescription to the pharmacy to help him with his dry mouth and help his comfort level?  Deidra stated that it is okay to contact the patient or can call her back. [facility]

## 2019-05-25 DIAGNOSIS — I350 Nonrheumatic aortic (valve) stenosis: Secondary | ICD-10-CM | POA: Diagnosis not present

## 2019-05-25 DIAGNOSIS — R1311 Dysphagia, oral phase: Secondary | ICD-10-CM | POA: Diagnosis not present

## 2019-05-25 DIAGNOSIS — G20 Parkinson's disease: Secondary | ICD-10-CM | POA: Diagnosis not present

## 2019-05-25 DIAGNOSIS — F0280 Dementia in other diseases classified elsewhere without behavioral disturbance: Secondary | ICD-10-CM | POA: Diagnosis not present

## 2019-05-25 DIAGNOSIS — G4752 REM sleep behavior disorder: Secondary | ICD-10-CM | POA: Diagnosis not present

## 2019-05-25 DIAGNOSIS — K219 Gastro-esophageal reflux disease without esophagitis: Secondary | ICD-10-CM | POA: Diagnosis not present

## 2019-05-26 DIAGNOSIS — G4752 REM sleep behavior disorder: Secondary | ICD-10-CM | POA: Diagnosis not present

## 2019-05-26 DIAGNOSIS — G20 Parkinson's disease: Secondary | ICD-10-CM | POA: Diagnosis not present

## 2019-05-26 DIAGNOSIS — R1311 Dysphagia, oral phase: Secondary | ICD-10-CM | POA: Diagnosis not present

## 2019-05-26 DIAGNOSIS — K219 Gastro-esophageal reflux disease without esophagitis: Secondary | ICD-10-CM | POA: Diagnosis not present

## 2019-05-26 DIAGNOSIS — F0280 Dementia in other diseases classified elsewhere without behavioral disturbance: Secondary | ICD-10-CM | POA: Diagnosis not present

## 2019-05-26 DIAGNOSIS — I350 Nonrheumatic aortic (valve) stenosis: Secondary | ICD-10-CM | POA: Diagnosis not present

## 2019-05-29 ENCOUNTER — Other Ambulatory Visit: Payer: Self-pay | Admitting: Internal Medicine

## 2019-05-29 ENCOUNTER — Telehealth: Payer: Self-pay

## 2019-05-29 DIAGNOSIS — F0280 Dementia in other diseases classified elsewhere without behavioral disturbance: Secondary | ICD-10-CM | POA: Diagnosis not present

## 2019-05-29 DIAGNOSIS — R1311 Dysphagia, oral phase: Secondary | ICD-10-CM | POA: Diagnosis not present

## 2019-05-29 DIAGNOSIS — G4752 REM sleep behavior disorder: Secondary | ICD-10-CM | POA: Diagnosis not present

## 2019-05-29 DIAGNOSIS — K219 Gastro-esophageal reflux disease without esophagitis: Secondary | ICD-10-CM | POA: Diagnosis not present

## 2019-05-29 DIAGNOSIS — I350 Nonrheumatic aortic (valve) stenosis: Secondary | ICD-10-CM | POA: Diagnosis not present

## 2019-05-29 DIAGNOSIS — G20 Parkinson's disease: Secondary | ICD-10-CM | POA: Diagnosis not present

## 2019-05-29 MED ORDER — GABAPENTIN 100 MG PO CAPS: 100.0000 mg | ORAL_CAPSULE | Freq: Three times a day (TID) | ORAL | 2 refills | Status: DC

## 2019-05-29 NOTE — Telephone Encounter (Signed)
That would be an excellent idea. They can definitely benefit from a palliative care consultation

## 2019-05-29 NOTE — Telephone Encounter (Signed)
Called to verify how he is taking it now. Wife said 3 times a day. I have built the prescription for tid. Sent to Dr Silvio Pate to approve refills.

## 2019-05-29 NOTE — Telephone Encounter (Signed)
Spoke to [place]. She will start the referral

## 2019-05-29 NOTE — Telephone Encounter (Signed)
Patient's daughter called requesting a refill for the patient she stated that the home health nurse was out this morning and they advised her to call our office to have this sent asap for the patient  She stated that the patient is in a lot of pain  And they really need this as soon as we can send it   GABAPENTIN   [place]

## 2019-05-29 NOTE — Telephone Encounter (Signed)
Langley Gauss with [facility] palliative care division left v/m that [facility] requested palliative services for the pt. Langley Gauss said if OK with Dr Silvio Pate request verbal order for palliative care and Langley Gauss is faxing a palliative care request form to be completed and signed by Dr Silvio Pate and faxed back to [phone]. Pt and pts family is in agreement with palliative care for the pt per [facility].Please advise.

## 2019-05-30 ENCOUNTER — Telehealth: Payer: Self-pay | Admitting: Adult Health Nurse Practitioner

## 2019-05-30 DIAGNOSIS — K219 Gastro-esophageal reflux disease without esophagitis: Secondary | ICD-10-CM | POA: Diagnosis not present

## 2019-05-30 DIAGNOSIS — G20 Parkinson's disease: Secondary | ICD-10-CM | POA: Diagnosis not present

## 2019-05-30 DIAGNOSIS — F0280 Dementia in other diseases classified elsewhere without behavioral disturbance: Secondary | ICD-10-CM | POA: Diagnosis not present

## 2019-05-30 DIAGNOSIS — I350 Nonrheumatic aortic (valve) stenosis: Secondary | ICD-10-CM | POA: Diagnosis not present

## 2019-05-30 DIAGNOSIS — R1311 Dysphagia, oral phase: Secondary | ICD-10-CM | POA: Diagnosis not present

## 2019-05-30 DIAGNOSIS — G4752 REM sleep behavior disorder: Secondary | ICD-10-CM | POA: Diagnosis not present

## 2019-05-30 NOTE — Telephone Encounter (Signed)
Called daughter back to let her know that NP could see patient tomorrow afternoon.  I have scheduled an In-Person Palliative Consult for 05/31/19 @ 2:30 PM.

## 2019-05-30 NOTE — Telephone Encounter (Signed)
Spoke with patient's daughter Eschol Melear regarding Palliative services and all questions answered and she was in agreement with this.  She asked if we could see patient this week while she was in town from [place], I told her that I would contact NP and see if she could work her in and that I would call her back and she was in agreement with this.

## 2019-05-31 ENCOUNTER — Telehealth: Payer: Self-pay | Admitting: Internal Medicine

## 2019-05-31 ENCOUNTER — Other Ambulatory Visit: Payer: Medicare Other | Admitting: Adult Health Nurse Practitioner

## 2019-05-31 ENCOUNTER — Other Ambulatory Visit: Payer: Self-pay

## 2019-05-31 DIAGNOSIS — Z515 Encounter for palliative care: Secondary | ICD-10-CM

## 2019-05-31 DIAGNOSIS — Z0279 Encounter for issue of other medical certificate: Secondary | ICD-10-CM

## 2019-05-31 DIAGNOSIS — G20 Parkinson's disease: Secondary | ICD-10-CM

## 2019-05-31 NOTE — Progress Notes (Signed)
[facility] Consult Note Telephone: [phone]  Fax: [phone]  PATIENT NAME: Casey Tucker DOB: January 29, 1941 MRN: [id number]  PRIMARY CARE PROVIDER:   [name], MD  REFERRING PROVIDER:  [name], MD [place],  [place] [address]  RESPONSIBLE PARTY:   Wife, Casey Tucker [id number] Daughter, Casey Tucker [phone] (lives in [place])      [place] and PLAN:  1.  Advanced care planning.  Patient is full code.  According to previous documents patient wants CPR but no prolonged artificial interventions.  Will discuss MOST at future visits.  2.  Parkinson's.  Patient is able to walk with a walker.  Does have several falls but has being doing better to remember to use his walker.  Requires assistance with ADLs.  Has a part time aide that comes into the home.  Have appointment with [facility] today to get more help in the home including with cooking and cleaning.  Patient currently is being followed by Dr. Carles Collet for his Parkinson's and Dr. Silvio Pate does the prescribing of his meds including his Parkinson's meds.  He is currently doing well with symptom management on Sinemet 25/100--2 tabs at 7:30a and 1 tab at 10a, 1p, 4p, and 7p.  He also gets Comtan 200 mg five times a day with each dose of Sinemet.  Continue follow up and recommendations by neurology.   3.  Pain.  Patient has started having severe leg pain in his lower legs a approximately a month ago. Does get some relief with icing and heating pad.  Also uses a Production assistant, radio.  Was started on gabapentin 200 mg QHS and 100mg  PRN.  Patient has been taking about 4-5 doses of the PRN Gabapentin 100mg  per day.  States that he gets relief for about an hour and will take another one an hour after taking one if still having pain.  Daughter does state that on the days that he is taking the Gabapentin at more scheduled times before the pain starts that he has a  better day.  Recommend scheduling the Gabapentin 200mg  TID and adding requip 0.5mg  QHS and see if this gives better pain control.  Have reached out to Dr. Alla German office with these recommendations.  Have also recommended to the family that they could use topical agents such as IcyHot cream or patches.    4.  SDTI.  Has SDTI on left heel which is being managed by home health nurse.  Currently are applying something like second skin to the area.  Continue current wound care  Palliative will continue to follow and make recommendations as needed for symptom management and decline.  Have appointment next week to assess effectiveness recommendations if approved by Dr. Silvio Pate  I spent 60 minutes providing this consultation,  from 2:30 to 3:30. More than 50% of the time in this consultation was spent coordinating communication.   HISTORY OF PRESENT ILLNESS:  Casey Tucker is a 78 y.o. year old male with multiple medical problems including Parkinson's with some dementia, OA, GERD. Palliative Care was asked to help address goals of care.   CODE STATUS: Full code  PPS: 50% HOSPICE ELIGIBILITY/DIAGNOSIS: TBD  PHYSICAL EXAM:   General: NAD, frail appearing, thin Cardiovascular: regular rate and rhythm Pulmonary: lung sounds clear with normal respiratory effort Abdomen: soft, nontender, + bowel sounds GU: no suprapubic tenderness Extremities: no edema, no joint deformities Skin: has SDTI to medial side of left heel  Neurological: Weakness but otherwise nonfocal; can be  forgetful  PAST MEDICAL HISTORY:  Past Medical History:  Diagnosis Date  . Arthritis    left hip   . GERD (gastroesophageal reflux disease) Pre 2002   Mild  . History of kidney stones   . 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 Parkinson disease ([place]) 2004   Slowly progressive  . Ruptured appendix teens    SOCIAL HX:  Social History   Tobacco  Use  . Smoking status: Never Smoker  . Smokeless tobacco: Never Used  Substance Use Topics  . Alcohol use: Yes    Alcohol/week: 7.0 standard drinks    Types: 7 Standard drinks or equivalent per week    Comment: occassionally    ALLERGIES:  Allergies  Allergen Reactions  . Amoxicillin   . Celebrex [Celecoxib]   . Demerol Other (See Comments)    Hallucinations   . Penicillins     REACTION: RASH     PERTINENT MEDICATIONS:  Outpatient Encounter Medications as of 05/31/2019  Medication Sig  . carbidopa-levodopa (SINEMET IR) 25-100 MG tablet 2 at 7:30am, 1 at 10am, 1 at 1pm, 1 at 4pm, 1 at 7pm  . clonazePAM (KLONOPIN) 0.5 MG tablet TAKE 1 TO 2 TABLETS AT BEDTIME AS NEEDED  . entacapone (COMTAN) 200 MG tablet Take 1 tablet (200 mg total) by mouth 5 (five) times daily. Take with Levodopa  . gabapentin (NEURONTIN) 100 MG capsule Take 1 capsule (100 mg total) by mouth 3 (three) times daily.   No facility-administered encounter medications on file as of 05/31/2019.       Amy Jenetta Downer, NP

## 2019-05-31 NOTE — Telephone Encounter (Signed)
LTD attending physician's statement placed in Dr. Alla German box for review.

## 2019-05-31 NOTE — Telephone Encounter (Signed)
Rebeca Allegra, nurse with [facility], called today in regards to the patient's Gabapentin.  Patient is currently taking 200mg  @ night then taking as needed through out the day. Amy stated that the patient has been taking 4-5 a day due to leg pain.  She would like a verbal order to Change the Gabapentin to 200mg  3x a day Then add REQUIP 1/2mg  @ night for leg pain.   Please advise

## 2019-06-01 ENCOUNTER — Telehealth: Payer: Self-pay

## 2019-06-01 DIAGNOSIS — G20 Parkinson's disease: Secondary | ICD-10-CM | POA: Diagnosis not present

## 2019-06-01 DIAGNOSIS — K219 Gastro-esophageal reflux disease without esophagitis: Secondary | ICD-10-CM | POA: Diagnosis not present

## 2019-06-01 DIAGNOSIS — I350 Nonrheumatic aortic (valve) stenosis: Secondary | ICD-10-CM | POA: Diagnosis not present

## 2019-06-01 DIAGNOSIS — R1311 Dysphagia, oral phase: Secondary | ICD-10-CM | POA: Diagnosis not present

## 2019-06-01 DIAGNOSIS — G4752 REM sleep behavior disorder: Secondary | ICD-10-CM | POA: Diagnosis not present

## 2019-06-01 DIAGNOSIS — F0280 Dementia in other diseases classified elsewhere without behavioral disturbance: Secondary | ICD-10-CM | POA: Diagnosis not present

## 2019-06-01 DIAGNOSIS — Z0279 Encounter for issue of other medical certificate: Secondary | ICD-10-CM

## 2019-06-01 NOTE — Telephone Encounter (Signed)
I am okay with gabapentin 100 bid (morning and afternoon) and 200mg  at bedtime Absolutely no requip---chance of psychosis is very high for him

## 2019-06-01 NOTE — Telephone Encounter (Signed)
Form finished and signed--thanks for the help $20

## 2019-06-01 NOTE — Telephone Encounter (Signed)
I left message on verified VM for Casey Tucker with the message from Dr Silvio Pate. I made a note in the medication list.

## 2019-06-01 NOTE — Telephone Encounter (Signed)
Spoke to Amy. She was asking about a rx. I advised that we did the rx on 05-29-19. He should have that. She wll let hima and the family know that the directions are 1 in am 1 in afternoon, and 2 at bedtime.

## 2019-06-01 NOTE — Telephone Encounter (Signed)
Paperwork faxed °

## 2019-06-01 NOTE — Telephone Encounter (Signed)
At the direction of Amy NP, message left for patient's daughter, [name] regarding gabapentin and Requip. Message included that directions of gabapentin as follows: 100mg  in the am and at lunch time, 200mg  at bed time. Dr. Kara Pacer is not in agreement with Requip due to high risk of psychosis for patient. Call back information provided

## 2019-06-02 NOTE — Telephone Encounter (Signed)
Paperwork faxed 11/12

## 2019-06-04 DIAGNOSIS — R1311 Dysphagia, oral phase: Secondary | ICD-10-CM | POA: Diagnosis not present

## 2019-06-04 DIAGNOSIS — I350 Nonrheumatic aortic (valve) stenosis: Secondary | ICD-10-CM | POA: Diagnosis not present

## 2019-06-04 DIAGNOSIS — K219 Gastro-esophageal reflux disease without esophagitis: Secondary | ICD-10-CM | POA: Diagnosis not present

## 2019-06-04 DIAGNOSIS — F0280 Dementia in other diseases classified elsewhere without behavioral disturbance: Secondary | ICD-10-CM | POA: Diagnosis not present

## 2019-06-04 DIAGNOSIS — L89622 Pressure ulcer of left heel, stage 2: Secondary | ICD-10-CM | POA: Diagnosis not present

## 2019-06-04 DIAGNOSIS — G20 Parkinson's disease: Secondary | ICD-10-CM | POA: Diagnosis not present

## 2019-06-04 DIAGNOSIS — Z96642 Presence of left artificial hip joint: Secondary | ICD-10-CM | POA: Diagnosis not present

## 2019-06-04 DIAGNOSIS — G4752 REM sleep behavior disorder: Secondary | ICD-10-CM | POA: Diagnosis not present

## 2019-06-06 ENCOUNTER — Telehealth: Payer: Self-pay | Admitting: Neurology

## 2019-06-06 ENCOUNTER — Telehealth: Payer: Self-pay

## 2019-06-06 NOTE — Telephone Encounter (Signed)
Spouse aware Copy for pt Copy for scan Copy for billing

## 2019-06-06 NOTE — Telephone Encounter (Signed)
[place] with [facility] left v/m requesting verbal orders to continue [facility] skilled nursing 2 x a wk for 3 wks for assessment of lt heel wound; is presently healing but wants to make sure the wound care being done is effective.

## 2019-06-06 NOTE — Telephone Encounter (Signed)
Liji from [facility] called requesting verbal orders for home health: 2 x week 1, 1 x week 1, 2 times week 4, and 1 time week 2

## 2019-06-06 NOTE — Telephone Encounter (Signed)
Detailed message left on VM with verbal orders. Number given on VM matched number in the message.

## 2019-06-06 NOTE — Telephone Encounter (Signed)
Casey Tucker is aware of verbal orders, and per protocol by Dr Tat the ok was given

## 2019-06-06 NOTE — Telephone Encounter (Signed)
That is fine 

## 2019-06-07 ENCOUNTER — Other Ambulatory Visit: Payer: Medicare Other | Admitting: Adult Health Nurse Practitioner

## 2019-06-07 ENCOUNTER — Other Ambulatory Visit: Payer: Self-pay

## 2019-06-07 DIAGNOSIS — Z515 Encounter for palliative care: Secondary | ICD-10-CM

## 2019-06-07 DIAGNOSIS — G20 Parkinson's disease: Secondary | ICD-10-CM | POA: Diagnosis not present

## 2019-06-07 NOTE — Progress Notes (Signed)
[facility] Consult Note Telephone: [phone]  Fax: [phone]  PATIENT NAME: Casey Tucker DOB: [birth year] MRN: [id number]  PRIMARY CARE PROVIDER:   [name], MD  REFERRING PROVIDER:  [name], MD [place],  [place] [address]  RESPONSIBLE PARTY:   Wife, Casey Tucker [id number] Daughter, Casey Tucker [phone] (lives in [place])      [place] and PLAN:  1.  Advanced care planning.  Patient is full code.  According to previous documents patient wants CPR but no prolonged artificial interventions. Wife flustered today with patient not feeling well due to pain.  Did not go into ACP conversation.  Updated daughter, Casey Tucker, via phone about visit.  2.  Pain.  Patient still having pain in legs.  He is taking gabapentin 100mg --1 tab in the AM, 1 tab midday, and 2 tabs at night. He gets some relief with this but still having a lot of pain. Dr. Silvio Pate does not want to increase the gabapentin and I agree as he says it does make him drowsy. Requip not started due to risk of psychosis.  An episode happened this morning in which his caregiver stated that he turned white and he states that it was due to the pain. The caregiver did give him his 1:00 dose of sinemet and comtan, and gabapentin at 11:15 and he said that it helped. When I saw him an hour later he was not pale and he was not complaining of pain.  Encouraged to give his 4:00 dose of Sinemet and comtan an hour early at 3:00 and then resume his usual dosing time schedule.  Wife and caregiver concerned that he may be in pain before his next dose of gabapentin at bedtime.  With the change in dosing times today did instruct that if he was having moderate to severe pain to give just one gabapentin before his bedtime dose but only if needed.  Have encouraged topical agents that could help with the pain as he does describe it as cramping.  Have  suggested magnesium oil, muscle rubs or patches, capsaicin cream. Patient believes that it may possibly related to effects of his Sinemet wearing off.  Have reached out to Dr. Carles Collet for any advice  3.  Parkinson's.  Patient is able to walk with a walker.  Does have several falls but has being doing better to remember to use his walker.  Requires assistance with ADLs.  Has a part time aide that comes into the home.  Has started having assistance from [place] from [facility].  Daughter was concerned that they are not using this extra help and has concerns for proper med management.  Daughter has ordered pill packs to help organize patient's meds.  Discussed with patient and wife that Casey Tucker was there to help with bathing along with light house work and meal prep.  Discussed that this may feel uncomfortable and awkward for a while but they would get used to it.    Palliative will continue to follow and make recommendations as needed for symptom management and decline. Have appointment after Thanksgiving.  I spent 60 minutes providing this consultation,  from 12:15 to 1:15. More than 50% of the time in this consultation was spent coordinating communication.   HISTORY OF PRESENT ILLNESS:  Casey Tucker is a 78 y.o. year old male with multiple medical problems including Parkinson's with some dementia, OA, GERD. Palliative Care was asked to help address  goals of care.   CODE STATUS: full code  PPS: 50% HOSPICE ELIGIBILITY/DIAGNOSIS: TBD  PHYSICAL EXAM:   General: NAD, frail appearing, thin Cardiovascular: regular rate and rhythm Pulmonary: lung sounds clear; normal respiratory effort Abdomen: soft, nontender, + bowel sounds GU: no suprapubic tenderness Extremities: no edema, no joint deformities Neurological: Weakness but otherwise nonfocal  PAST MEDICAL HISTORY:  Past Medical History:  Diagnosis Date   Arthritis    left hip    GERD (gastroesophageal reflux disease) Pre 2002   Mild    History of kidney stones    History of MRI of brain and brain stem 12/12/2004   with 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   Parkinson disease ([place]) 2004   Slowly progressive   Ruptured appendix teens    SOCIAL HX:  Social History   Tobacco Use   Smoking status: Never Smoker   Smokeless tobacco: Never Used  Substance Use Topics   Alcohol use: Yes    Alcohol/week: 7.0 standard drinks    Types: 7 Standard drinks or equivalent per week    Comment: occassionally    ALLERGIES:  Allergies  Allergen Reactions   Amoxicillin    Celebrex [Celecoxib]    Demerol Other (See Comments)    Hallucinations    Penicillins     REACTION: RASH     PERTINENT MEDICATIONS:  Outpatient Encounter Medications as of 06/07/2019  Medication Sig   carbidopa-levodopa (SINEMET IR) 25-100 MG tablet 2 at 7:30am, 1 at 10am, 1 at 1pm, 1 at 4pm, 1 at 7pm   clonazePAM (KLONOPIN) 0.5 MG tablet TAKE 1 TO 2 TABLETS AT BEDTIME AS NEEDED   entacapone (COMTAN) 200 MG tablet Take 1 tablet (200 mg total) by mouth 5 (five) times daily. Take with Levodopa   gabapentin (NEURONTIN) 100 MG capsule Take 1 capsule (100 mg total) by mouth 3 (three) times daily. (Patient taking differently: Take 100 mg by mouth 3 (three) times daily. 1 capsule in am and 1 in afternoon. 2 caps at bedtime)   No facility-administered encounter medications on file as of 06/07/2019.       Sherlie Boyum Jenetta Downer, NP

## 2019-06-08 DIAGNOSIS — F0280 Dementia in other diseases classified elsewhere without behavioral disturbance: Secondary | ICD-10-CM | POA: Diagnosis not present

## 2019-06-08 DIAGNOSIS — G20 Parkinson's disease: Secondary | ICD-10-CM | POA: Diagnosis not present

## 2019-06-08 DIAGNOSIS — R1311 Dysphagia, oral phase: Secondary | ICD-10-CM | POA: Diagnosis not present

## 2019-06-08 DIAGNOSIS — G4752 REM sleep behavior disorder: Secondary | ICD-10-CM | POA: Diagnosis not present

## 2019-06-08 DIAGNOSIS — L89622 Pressure ulcer of left heel, stage 2: Secondary | ICD-10-CM | POA: Diagnosis not present

## 2019-06-08 DIAGNOSIS — I350 Nonrheumatic aortic (valve) stenosis: Secondary | ICD-10-CM | POA: Diagnosis not present

## 2019-06-09 ENCOUNTER — Telehealth: Payer: Self-pay | Admitting: Neurology

## 2019-06-09 NOTE — Telephone Encounter (Signed)
noted 

## 2019-06-09 NOTE — Telephone Encounter (Signed)
Jennie from [facility] called regarding this patient and needing to Omit the visit for 06/08/19 due to having other in home visits.  She said they will do 1 x this week and reschedule the one they missed. Thank you

## 2019-06-12 ENCOUNTER — Telehealth: Payer: Self-pay | Admitting: Neurology

## 2019-06-12 ENCOUNTER — Telehealth: Payer: Self-pay

## 2019-06-12 DIAGNOSIS — M79604 Pain in right leg: Secondary | ICD-10-CM

## 2019-06-12 DIAGNOSIS — M79605 Pain in left leg: Secondary | ICD-10-CM

## 2019-06-12 NOTE — Telephone Encounter (Signed)
[place] Sonia Baller: Question about possible blood flow issues causing leg pain- she doesn't know if this has ever been an issue or if he has had a doppler. Please call her with any information. Thanks!

## 2019-06-12 NOTE — Telephone Encounter (Signed)
Casey Tucker is aware

## 2019-06-12 NOTE — Telephone Encounter (Signed)
This is out of my field of expertise.  Call pcp

## 2019-06-12 NOTE — Telephone Encounter (Signed)
Casey Tucker from [facility] called wanting to ask Dr. Silvio Pate if maybe patient could benefit from a doppler of lower extremities. Casey Tucker has been seen patient since September per Dr. Tat-neurology-order for Parkinson's. Patient is still complaining of leg pain and Gabapentin not helping, patient had a heel pressure ulcer on the left about 1 month ago that has healed now-but not usual for patient, patient also had a lower b/p reading the other day of 98/42-patient was asymptomatic and after a few exercises with P.T b/p was 96/50. Due to all of these symptoms Casey Tucker was wondering if this could be a vascular issue patient is having or any concerns for this have been done before. Doppler needed? Jenni's CB is [phone]

## 2019-06-12 NOTE — Telephone Encounter (Signed)
Let her know (and Dr Deon Pilling) that that is probably a good idea. I did order it to be done at the [facility] cardiology office--they should contact him soon about setting up the test

## 2019-06-12 NOTE — Telephone Encounter (Signed)
See previous note dated 06/12/2019 and then advise on message below as well

## 2019-06-12 NOTE — Telephone Encounter (Signed)
Jennie from [facility] called. Patients blood pressure was 98/42 at beginning of session and by the end it was 98/50. Asymptomatic.

## 2019-06-12 NOTE — Telephone Encounter (Signed)
She said he had a pressure area on his heel recently and that is odd for him. That is what was bringing up the concern for the blood flow issue.

## 2019-06-13 ENCOUNTER — Telehealth: Payer: Self-pay | Admitting: Neurology

## 2019-06-13 ENCOUNTER — Telehealth: Payer: Self-pay

## 2019-06-13 ENCOUNTER — Telehealth: Payer: Self-pay | Admitting: Internal Medicine

## 2019-06-13 NOTE — Telephone Encounter (Signed)
Spoke to [place] and advised her it was [facility]. I had no other information than that.

## 2019-06-13 NOTE — Telephone Encounter (Signed)
Casey Tucker is aware, patient handles his own medications and did state he increased the medication. Casey Tucker is calling Dr Silvio Pate in regards to increasing the Gabapentin

## 2019-06-13 NOTE — Telephone Encounter (Signed)
Home Health nurse Dondra Spry is calling in about patient's pain. The pain is in both his legs and the Neurontin isn't helping he said. She isn't sure what Dr. Carles Collet would like to do. Please call her back at [phone]. Thanks!

## 2019-06-13 NOTE — Telephone Encounter (Signed)
If taking the extra levodopa as instructed (or extra 1/2) didn't help the leg pain, then it is not PD related.  Is someone monitoring his meds (I.e. directly giving them to him) or are they relying on him to give them to himself

## 2019-06-13 NOTE — Telephone Encounter (Signed)
Spoke to [place] and pt's wife. Pt's wife advised that he is scheduled.

## 2019-06-13 NOTE — Telephone Encounter (Signed)
Leamon Arnt [facility] nurse left v/m that pt having pain without relief from taking gabapentin 100 mg taking one capsule in morning, one capsule in afternoon and 2 capsules at hs. Sherry request cb. Dr Silvio Pate out of office with no computer access until 06/19/19.Please advise.

## 2019-06-13 NOTE — Telephone Encounter (Signed)
Please see telephone call from the PCP office as well in regards to this, please advise

## 2019-06-13 NOTE — Telephone Encounter (Signed)
Increase gabapentin to 300 mg 3 x day, notify us if no improvement

## 2019-06-13 NOTE — Telephone Encounter (Signed)
[place], requesting the name of facility that did physical therapy, so she can get records.

## 2019-06-14 ENCOUNTER — Other Ambulatory Visit: Payer: Self-pay | Admitting: Internal Medicine

## 2019-06-14 NOTE — Telephone Encounter (Signed)
Spoke to [place]. She will advise the pt and his family.

## 2019-06-14 NOTE — Telephone Encounter (Signed)
Pt calling to verify that gabapentin 300 mg was sent to [facility]. Pt advised that gabapentin had already been sent electronically to [facility]; pt voiced understanding.

## 2019-06-19 ENCOUNTER — Other Ambulatory Visit (INDEPENDENT_AMBULATORY_CARE_PROVIDER_SITE_OTHER): Payer: Medicare Other

## 2019-06-19 ENCOUNTER — Other Ambulatory Visit: Payer: Self-pay

## 2019-06-19 DIAGNOSIS — M79604 Pain in right leg: Secondary | ICD-10-CM

## 2019-06-19 DIAGNOSIS — M79605 Pain in left leg: Secondary | ICD-10-CM | POA: Diagnosis not present

## 2019-06-20 ENCOUNTER — Telehealth: Payer: Self-pay

## 2019-06-20 NOTE — Telephone Encounter (Signed)
[facility] nurse left v/m that pt could not tolerate the Gabapentin 300 mg taking tid. Sherry request that Gabapentin 300 mg be d/c and pt could take gabapentin 100 mg taking 200 mg in AM and 100 mg in afternoon. Sherry request cb.

## 2019-06-21 MED ORDER — GABAPENTIN 100 MG PO CAPS: ORAL_CAPSULE | ORAL | 2 refills | Status: DC

## 2019-06-21 NOTE — Telephone Encounter (Signed)
Spoke to [place]. He is already taking 200mg  at bedtime. He wants to take the 100mg  4 times a day and 200mg  at bedtime. So the new rx needs to read 1-2 capsules up to 4 times a day and 2 capsules at bedtime. Send to [facility]

## 2019-06-21 NOTE — Telephone Encounter (Signed)
I am fine with adjusting the gabapentin as he can tolerate. He may want to increase just the nighttime dose----she didn't mention how much he was taking at bedtime

## 2019-06-21 NOTE — Telephone Encounter (Signed)
Left message on VM to call back

## 2019-06-22 ENCOUNTER — Other Ambulatory Visit: Payer: Self-pay

## 2019-06-22 ENCOUNTER — Other Ambulatory Visit: Payer: Medicare Other | Admitting: Adult Health Nurse Practitioner

## 2019-06-22 DIAGNOSIS — G20 Parkinson's disease: Secondary | ICD-10-CM

## 2019-06-22 DIAGNOSIS — Z515 Encounter for palliative care: Secondary | ICD-10-CM

## 2019-06-22 NOTE — Progress Notes (Signed)
[facility] Consult Note Telephone: [phone]  Fax: [phone]  PATIENT NAME: Casey Tucker DOB: April 24, 1941 MRN: [id number]  PRIMARY CARE PROVIDER:   [name], MD  REFERRING PROVIDER:  [name], MD [place],   [id number]  RESPONSIBLE PARTY:   Wife, Aubrey Powelson [id number] Daughter, [name] [phone] (lives in [place])       [place] and PLAN:  1.  Advanced care planning. Patient is full code. According to previous documents patient wants CPR but no prolonged artificial interventions.  Left VM for daughter, [name], to update on visit.   2.  Pain.  Patient still having pain in legs.  He was started on Gabapentin 300 mg but this made him too sleep.  Went back to Gabapentin 100mg  1 tab at 7:30AM and at 1:00pm; 2 tabs at 7:30pm.  Wife says that she thinks this seems to be helping.  He has not picked up the new gabapentin dosage so has not had any this morning.  Have called the pharmacy and have it arranged to be delivered.  Wife states that they bought some magnesium oil to massage into his legs but have not tried this yet. Continue gabapentin as directed and use of topical agents to help with the leg pain.  3.  Parkinson's.  Patient is able to walk with a walker. Does have several falls but has being doing better to remember to use his walker. Requires assistance with ADLs. Has a part time aide that comes into the home. Patient is getting more used to [facility] from [facility] being there and helping out.  States that he massages his legs, helps him with a bath, runs errands, cooks supper, and helps with some cleaning.  They also have a lady who comes in every Tuesday to help with cleaning.  Have encouraged to continue using these services to help lessen their burden. Continue follow up and recommendations by neurology  4.  Constipation.  Patient states he has not had a BM in 4  days.  States he takes Senna S--4 tabs per day and Miralax daily.  Have suggested Milk of Magnesia or use of bisacodyl suppositories. Was able to have pharmacy deliver these along with his gabapentin.  Patient was able to have a BM before our visit ended.  Was instructed on use of Milk of Magnesia and suppositories and he will have them in the home if needed in the future.  If not contraindicated could try linzess or amitiza to see if this gives him better regularity.    Palliative will continue to follow and make recommendations as needed for symptom management and decline.  Will call after the holidays for next appointment.   I spent 60 minutes providing this consultation,  from 10:00 to 11:00. More than 50% of the time in this consultation was spent coordinating communication.   HISTORY OF PRESENT ILLNESS:  Casey Tucker is a 78 y.o. year old male with multiple medical problems including Parkinson's with some dementia, OA, GERD. Palliative Care was asked to help address goals of care.   CODE STATUS: full code   PPS: 50% HOSPICE ELIGIBILITY/DIAGNOSIS: TBD  PHYSICAL EXAM:   General: NAD, frail appearing, thin Extremities: no edema, no joint deformities Skin: no rashes Neurological: Weakness but otherwise nonfocal   PAST MEDICAL HISTORY:  Past Medical History:  Diagnosis Date  . Arthritis    left hip   . GERD (gastroesophageal reflux  disease) Pre 2002   Mild  . History of kidney stones   . 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 Parkinson disease ([place]) 2004   Slowly progressive  . Ruptured appendix teens    SOCIAL HX:  Social History   Tobacco Use  . Smoking status: Never Smoker  . Smokeless tobacco: Never Used  Substance Use Topics  . Alcohol use: Yes    Alcohol/week: 7.0 standard drinks    Types: 7 Standard drinks or equivalent per week    Comment: occassionally    ALLERGIES:   Allergies  Allergen Reactions  . Amoxicillin   . Celebrex [Celecoxib]   . Demerol Other (See Comments)    Hallucinations   . Penicillins     REACTION: RASH     PERTINENT MEDICATIONS:  Outpatient Encounter Medications as of 06/22/2019  Medication Sig  . carbidopa-levodopa (SINEMET IR) 25-100 MG tablet 2 at 7:30am, 1 at 10am, 1 at 1pm, 1 at 4pm, 1 at 7pm  . clonazePAM (KLONOPIN) 0.5 MG tablet TAKE 1 TO 2 TABLETS AT BEDTIME AS NEEDED  . entacapone (COMTAN) 200 MG tablet Take 1 tablet (200 mg total) by mouth 5 (five) times daily. Take with Levodopa  . gabapentin (NEURONTIN) 100 MG capsule 1-2 capsules up to 4 times a day as needed and 2 capsules at bedtime   No facility-administered encounter medications on file as of 06/22/2019.      Amy Jenetta Downer, NP

## 2019-06-23 ENCOUNTER — Other Ambulatory Visit: Payer: Medicare Other

## 2019-07-04 ENCOUNTER — Encounter: Payer: Self-pay | Admitting: Internal Medicine

## 2019-07-04 ENCOUNTER — Ambulatory Visit (INDEPENDENT_AMBULATORY_CARE_PROVIDER_SITE_OTHER): Payer: Medicare Other | Admitting: Internal Medicine

## 2019-07-04 ENCOUNTER — Other Ambulatory Visit: Payer: Self-pay

## 2019-07-04 VITALS — BP 114/70 | HR 76 | Temp 96.4°F | Ht 71.5 in | Wt 147.0 lb

## 2019-07-04 DIAGNOSIS — Z0000 Encounter for general adult medical examination without abnormal findings: Secondary | ICD-10-CM | POA: Diagnosis not present

## 2019-07-04 DIAGNOSIS — E441 Mild protein-calorie malnutrition: Secondary | ICD-10-CM | POA: Diagnosis not present

## 2019-07-04 DIAGNOSIS — F0150 Vascular dementia without behavioral disturbance: Secondary | ICD-10-CM | POA: Insufficient documentation

## 2019-07-04 DIAGNOSIS — G4752 REM sleep behavior disorder: Secondary | ICD-10-CM | POA: Diagnosis not present

## 2019-07-04 DIAGNOSIS — G20 Parkinson's disease: Secondary | ICD-10-CM

## 2019-07-04 DIAGNOSIS — R1311 Dysphagia, oral phase: Secondary | ICD-10-CM | POA: Diagnosis not present

## 2019-07-04 DIAGNOSIS — F0280 Dementia in other diseases classified elsewhere without behavioral disturbance: Secondary | ICD-10-CM

## 2019-07-04 DIAGNOSIS — F0390 Unspecified dementia without behavioral disturbance: Secondary | ICD-10-CM | POA: Insufficient documentation

## 2019-07-04 DIAGNOSIS — F39 Unspecified mood [affective] disorder: Secondary | ICD-10-CM | POA: Diagnosis not present

## 2019-07-04 DIAGNOSIS — I350 Nonrheumatic aortic (valve) stenosis: Secondary | ICD-10-CM | POA: Diagnosis not present

## 2019-07-04 DIAGNOSIS — Z7189 Other specified counseling: Secondary | ICD-10-CM | POA: Diagnosis not present

## 2019-07-04 DIAGNOSIS — M48062 Spinal stenosis, lumbar region with neurogenic claudication: Secondary | ICD-10-CM

## 2019-07-04 DIAGNOSIS — G20A1 Parkinson's disease without dyskinesia, without mention of fluctuations: Secondary | ICD-10-CM

## 2019-07-04 DIAGNOSIS — L89622 Pressure ulcer of left heel, stage 2: Secondary | ICD-10-CM | POA: Diagnosis not present

## 2019-07-04 DIAGNOSIS — Z96642 Presence of left artificial hip joint: Secondary | ICD-10-CM | POA: Diagnosis not present

## 2019-07-04 DIAGNOSIS — K219 Gastro-esophageal reflux disease without esophagitis: Secondary | ICD-10-CM | POA: Diagnosis not present

## 2019-07-04 MED ORDER — GABAPENTIN 100 MG PO CAPS: 100.0000 mg | ORAL_CAPSULE | Freq: Every day | ORAL | 0 refills | Status: DC

## 2019-07-04 NOTE — Assessment & Plan Note (Signed)
Dysthymia related to his increased disability No MDD No Rx indicated at this point

## 2019-07-04 NOTE — Assessment & Plan Note (Signed)
Has stabilized More help getting food in the house now

## 2019-07-04 NOTE — Assessment & Plan Note (Signed)
Still with functional limitations--but some relief with massage and night gabapentin

## 2019-07-04 NOTE — Assessment & Plan Note (Signed)
I have personally reviewed the Medicare Annual Wellness questionnaire and have noted 1. The patient's medical and social history 2. Their use of alcohol, tobacco or illicit drugs 3. Their current medications and supplements 4. The patient's functional ability including ADL's, fall risks, home safety risks and hearing or visual             impairment. 5. Diet and physical activities 6. Evidence for depression or mood disorders  The patients weight, height, BMI and visual acuity have been recorded in the chart I have made referrals, counseling and provided education to the patient based review of the above and I have provided the pt with a written personalized care plan for preventive services.  I have provided you with a copy of your personalized plan for preventive services. Please take the time to review along with your updated medication list.  Flu vaccine daily Done with cancer screening Discussed strengthening as possible

## 2019-07-04 NOTE — Assessment & Plan Note (Signed)
Mild Discussed safety for meds, etc Now with some home help and supervision (daughter helping)

## 2019-07-04 NOTE — Assessment & Plan Note (Signed)
DBS and sinemet Now with some functional decline---home aides have helped

## 2019-07-04 NOTE — Progress Notes (Signed)
Hearing Screening   125Hz  250Hz  500Hz  1000Hz  2000Hz  3000Hz  4000Hz  6000Hz  8000Hz   Right ear:           Left ear:           Comments: Has hearing aids. Not wearing them today.  Vision Screening Comments: May 2020

## 2019-07-04 NOTE — Assessment & Plan Note (Signed)
See social history 

## 2019-07-04 NOTE — Progress Notes (Signed)
Subjective:    Patient ID: Casey Tucker, male    DOB: 1941-03-02, 78 y.o.   MRN: [id number]  HPI Here with wife for Medicare wellness visit and follow up of chronic health conditions  This visit occurred during the SARS-CoV-2 public health emergency.  Safety protocols were in place, including screening questions prior to the visit, additional usage of staff PPE, and extensive cleaning of exam room while observing appropriate contact time as indicated for disinfecting solutions.   Reviewed advanced directives Reviewed other doctors--Dr Tat--neurology, Dr Lenox Ponds, Dr Lenis Noon No hospitalizations or surgery in past year 2 falls---no injury. Doing better with keeping walker or cane all the time Occasional drink of alcohol No tobacco Some down days--not daily. Hard times lately with his changes (and staying home with COVID). No regular anxiety Vision is okay Hearing aides are helpful Ongoing stable memory changes---wife feels hers is worse  Still working with the long term disability insurance---they are delaying Has aide 3 days a week--help with showering Needs some help getting dressed--wife can help with this Generally can use the restroom himself No recent incontinence Walks with 3 wheeled rolling walker RN visits at times Daughter doing the shopping Wife does most of cooking--and they order online He doesn't really help with instrumental ADLs  Voids fair Nocturia x 1-2  Parkinson's is about the same DBS in place Same sinemet  No chest pain or SOB No palpitations Dizziness related to Parkinson's No syncope  Ongoing back pain---not much there Ongoing pain into the legs---runs in "spells"----good days/bad days No other Rx except tylenol or aleve  Current Outpatient Medications on File Prior to Visit  Medication Sig Dispense Refill  . carbidopa-levodopa (SINEMET IR) 25-100 MG tablet 2 at 7:30am, 1 at 10am, 1 at 1pm, 1 at 4pm, 1 at 7pm 540 tablet 1  .  clonazePAM (KLONOPIN) 0.5 MG tablet TAKE 1 TO 2 TABLETS AT BEDTIME AS NEEDED 60 tablet 0  . entacapone (COMTAN) 200 MG tablet Take 1 tablet (200 mg total) by mouth 5 (five) times daily. Take with Levodopa 450 tablet 1  . gabapentin (NEURONTIN) 100 MG capsule 1-2 capsules up to 4 times a day as needed and 2 capsules at bedtime 300 capsule 2  . polyethylene glycol (MIRALAX / GLYCOLAX) 17 g packet Take 17 g by mouth daily.    . sennosides-docusate sodium (SENOKOT-S) 8.6-50 MG tablet Take 2 tablets by mouth 2 (two) times daily.     No current facility-administered medications on file prior to visit.    Allergies  Allergen Reactions  . Amoxicillin   . Celebrex [Celecoxib]   . Demerol Other (See Comments)    Hallucinations   . Penicillins     REACTION: RASH    Past Medical History:  Diagnosis Date  . Arthritis    left hip   . GERD (gastroesophageal reflux disease) Pre 2002   Mild  . History of kidney stones   . 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 Parkinson disease ([place]) 2004   Slowly progressive  . Ruptured appendix teens    Past Surgical History:  Procedure Laterality Date  . APPENDECTOMY    . CATARACT EXTRACTION W/ INTRAOCULAR LENS  IMPLANT, BILATERAL  2017  . DEEP BRAIN STIMULATOR PLACEMENT  8/14   L STN  . DOPPLER ECHOCARDIOGRAPHY  01/10/2009   LV NML Mild LVH EF 60-65% aortic sclerosis w/0 stenosis   . JOINT REPLACEMENT  left hip replacement 08/14/11/   . SUBTHALAMIC STIMULATOR BATTERY REPLACEMENT Left 03/25/2017   Procedure: Deep Brain Stimulator battery replacement;  Surgeon: Erline Levine, MD;  Location: [facility];  Service: Neurosurgery;  Laterality: Left;  left  . TONSILLECTOMY    . TOTAL HIP REVISION  08/27/2011   Procedure: TOTAL HIP REVISION;  Surgeon: Mauri Pole, MD;  Location: [facility];  Service: Orthopedics;  Laterality: Left;    Family History  Problem Relation Age of  Onset  . Arthritis Mother        knee replacement  . COPD Father        emphysema, smoker  . Heart disease Father        CHF  . Healthy Sister   . Alcohol abuse Paternal Uncle   . Rheum arthritis Daughter     Social History   Socioeconomic History  . Marital status: Married    Spouse name: Not on file  . Number of children: 2  . Years of education: Not on file  . Highest education level: Doctorate  Occupational History  . Occupation: Optometrist    Comment: medically retired  Tobacco Use  . Smoking status: Never Smoker  . Smokeless tobacco: Never Used  Substance and Sexual Activity  . Alcohol use: Yes    Alcohol/week: 7.0 standard drinks    Types: 7 Standard drinks or equivalent per week    Comment: occassionally  . Drug use: No  . Sexual activity: Yes  Other Topics Concern  . Not on file  Social History Narrative   Married, lives with wife   2 daughters      Has living will   Wife has health care POA---then daughters   Would still accept CPR--but no prolonged artificial means (ventilator or tube feeds)   Social Determinants of Health   Financial Resource Strain:   . Difficulty of Paying Living Expenses: Not on file  Food Insecurity:   . Worried About Charity fundraiser in the Last Year: Not on file  . Ran Out of Food in the Last Year: Not on file  Transportation Needs:   . Lack of Transportation (Medical): Not on file  . Lack of Transportation (Non-Medical): Not on file  Physical Activity:   . Days of Exercise per Week: Not on file  . Minutes of Exercise per Session: Not on file  Stress:   . Feeling of Stress : Not on file  Social Connections:   . Frequency of Communication with Friends and Family: Not on file  . Frequency of Social Gatherings with Friends and Family: Not on file  . Attends Religious Services: Not on file  . Active Member of Clubs or Organizations: Not on file  . Attends Archivist Meetings: Not on file  . Marital Status:  Not on file  Intimate Partner Violence:   . Fear of Current or Ex-Partner: Not on file  . Emotionally Abused: Not on file  . Physically Abused: Not on file  . Sexually Abused: Not on file   Review of Systems Appetite is still spotty--- weight has now stabilized Sleep is okay with 100mg  gabapentin. More knocks him out too much Teeth okay--keeps up with dentist No rash or suspicious skin lesions Wears seat belt No heartburn or dysphagia Bowels okay with meds. No blood    Objective:   Physical Exam  Constitutional: No distress.  HENT:  Mouth/Throat: Oropharynx is clear and moist. No oropharyngeal exudate.  Neck: No thyromegaly present.  Cardiovascular: Normal rate, regular rhythm, normal heart sounds and intact distal pulses. Exam reveals no gallop.  No murmur heard. Respiratory: Effort normal and breath sounds normal. No respiratory distress. He has no wheezes. He has no rales.  GI: Soft. There is no abdominal tenderness.  Musculoskeletal:        General: No edema.  Lymphadenopathy:    He has no cervical adenopathy.  Neurological: He is alert.  Stated "2001" President--- "Biden, Obama, Bush" [id number] (lots of trouble) mixing up some Recall 3/3  Mild increased tone and spasticity  Skin: No rash noted. No erythema.  Psychiatric: He has a normal mood and affect. His behavior is normal.           Assessment & Plan:

## 2019-07-05 DIAGNOSIS — L89622 Pressure ulcer of left heel, stage 2: Secondary | ICD-10-CM | POA: Diagnosis not present

## 2019-07-05 DIAGNOSIS — F0280 Dementia in other diseases classified elsewhere without behavioral disturbance: Secondary | ICD-10-CM | POA: Diagnosis not present

## 2019-07-05 DIAGNOSIS — R1311 Dysphagia, oral phase: Secondary | ICD-10-CM | POA: Diagnosis not present

## 2019-07-05 DIAGNOSIS — G4752 REM sleep behavior disorder: Secondary | ICD-10-CM | POA: Diagnosis not present

## 2019-07-05 DIAGNOSIS — G20 Parkinson's disease: Secondary | ICD-10-CM | POA: Diagnosis not present

## 2019-07-05 DIAGNOSIS — I350 Nonrheumatic aortic (valve) stenosis: Secondary | ICD-10-CM | POA: Diagnosis not present

## 2019-07-06 ENCOUNTER — Other Ambulatory Visit: Payer: Self-pay

## 2019-07-06 MED ORDER — ENTACAPONE 200 MG PO TABS: 200.0000 mg | ORAL_TABLET | Freq: Every day | ORAL | 1 refills | Status: DC

## 2019-07-07 DIAGNOSIS — F0280 Dementia in other diseases classified elsewhere without behavioral disturbance: Secondary | ICD-10-CM | POA: Diagnosis not present

## 2019-07-07 DIAGNOSIS — L89622 Pressure ulcer of left heel, stage 2: Secondary | ICD-10-CM | POA: Diagnosis not present

## 2019-07-07 DIAGNOSIS — G4752 REM sleep behavior disorder: Secondary | ICD-10-CM | POA: Diagnosis not present

## 2019-07-07 DIAGNOSIS — G20 Parkinson's disease: Secondary | ICD-10-CM | POA: Diagnosis not present

## 2019-07-07 DIAGNOSIS — R1311 Dysphagia, oral phase: Secondary | ICD-10-CM | POA: Diagnosis not present

## 2019-07-07 DIAGNOSIS — I350 Nonrheumatic aortic (valve) stenosis: Secondary | ICD-10-CM | POA: Diagnosis not present

## 2019-07-12 ENCOUNTER — Telehealth: Payer: Self-pay | Admitting: Neurology

## 2019-07-12 NOTE — Telephone Encounter (Signed)
Sonia Baller called from [facility] and wanted to let Dr. Carles Collet know that he wanted to Eliminate this visit and add it to the end of Care.

## 2019-07-18 DIAGNOSIS — I350 Nonrheumatic aortic (valve) stenosis: Secondary | ICD-10-CM | POA: Diagnosis not present

## 2019-07-18 DIAGNOSIS — L89622 Pressure ulcer of left heel, stage 2: Secondary | ICD-10-CM | POA: Diagnosis not present

## 2019-07-18 DIAGNOSIS — G20 Parkinson's disease: Secondary | ICD-10-CM | POA: Diagnosis not present

## 2019-07-18 DIAGNOSIS — G4752 REM sleep behavior disorder: Secondary | ICD-10-CM | POA: Diagnosis not present

## 2019-07-18 DIAGNOSIS — R1311 Dysphagia, oral phase: Secondary | ICD-10-CM | POA: Diagnosis not present

## 2019-07-18 DIAGNOSIS — F0280 Dementia in other diseases classified elsewhere without behavioral disturbance: Secondary | ICD-10-CM | POA: Diagnosis not present

## 2019-07-24 DIAGNOSIS — R1311 Dysphagia, oral phase: Secondary | ICD-10-CM | POA: Diagnosis not present

## 2019-07-24 DIAGNOSIS — L89622 Pressure ulcer of left heel, stage 2: Secondary | ICD-10-CM | POA: Diagnosis not present

## 2019-07-24 DIAGNOSIS — G20 Parkinson's disease: Secondary | ICD-10-CM | POA: Diagnosis not present

## 2019-07-24 DIAGNOSIS — F0280 Dementia in other diseases classified elsewhere without behavioral disturbance: Secondary | ICD-10-CM | POA: Diagnosis not present

## 2019-07-24 DIAGNOSIS — I350 Nonrheumatic aortic (valve) stenosis: Secondary | ICD-10-CM | POA: Diagnosis not present

## 2019-07-24 DIAGNOSIS — G4752 REM sleep behavior disorder: Secondary | ICD-10-CM | POA: Diagnosis not present

## 2019-07-27 ENCOUNTER — Telehealth: Payer: Self-pay | Admitting: Adult Health Nurse Practitioner

## 2019-07-27 NOTE — Telephone Encounter (Signed)
Returned daughter's call about her father and mother.  Patient has parkinson's and is unable to take care of himself.  His wife has mild dementia and is becoming more forgetful which is affecting his care as well.  Daughters have concerns for them living alone and they are cutting back on the hours of the [facility] staff they have coming in. Requesting that I make an unannounced visit to see what is going on.  Plan on visiting Monday 1/11 around 11am to see how they are doing.  Concerned that placement in assisted living may be best for them but they will be resistant to the idea.  Discussed SW involvement if needed. Thatiana Renbarger K. Olena Heckle NP

## 2019-07-31 ENCOUNTER — Other Ambulatory Visit: Payer: Medicare Other | Admitting: Adult Health Nurse Practitioner

## 2019-07-31 ENCOUNTER — Other Ambulatory Visit: Payer: Self-pay

## 2019-07-31 DIAGNOSIS — R1311 Dysphagia, oral phase: Secondary | ICD-10-CM | POA: Diagnosis not present

## 2019-07-31 DIAGNOSIS — L89622 Pressure ulcer of left heel, stage 2: Secondary | ICD-10-CM | POA: Diagnosis not present

## 2019-07-31 DIAGNOSIS — G4752 REM sleep behavior disorder: Secondary | ICD-10-CM | POA: Diagnosis not present

## 2019-07-31 DIAGNOSIS — F0280 Dementia in other diseases classified elsewhere without behavioral disturbance: Secondary | ICD-10-CM | POA: Diagnosis not present

## 2019-07-31 DIAGNOSIS — G20 Parkinson's disease: Secondary | ICD-10-CM | POA: Diagnosis not present

## 2019-07-31 DIAGNOSIS — I350 Nonrheumatic aortic (valve) stenosis: Secondary | ICD-10-CM | POA: Diagnosis not present

## 2019-07-31 DIAGNOSIS — G20A1 Parkinson's disease without dyskinesia, without mention of fluctuations: Secondary | ICD-10-CM

## 2019-07-31 DIAGNOSIS — Z515 Encounter for palliative care: Secondary | ICD-10-CM | POA: Diagnosis not present

## 2019-07-31 NOTE — Telephone Encounter (Signed)
Home health called asking for orders, following up from call in 12/23, I did not see any new orders from the 23rd, Sonia Baller from home health stated that he would be DC today with out any new orders

## 2019-07-31 NOTE — Progress Notes (Signed)
[facility] Consult Note Telephone: [phone]  Fax: [phone]  PATIENT NAME: Casey Tucker DOB: 09/11/1940 MRN: [id number]  PRIMARY CARE PROVIDER:   [name], MD  REFERRING PROVIDER:  [name], MD [place],  [place] [address]  RESPONSIBLE PARTY:   Wife, Favor Rauner [id number] Daughter, [name] [phone] (lives in [place])     [place] and PLAN:  1.  Advanced care planning. Patient is full code. According to previous documents patient wants CPR but no prolonged artificial interventions.  Will call daughter with update  2.  Parkinson's.  Patient is able to ambulate with a walker. He requires assistance with ADLs. He is still having leg pain and does get relief with gabapentin 100mg . He also gets relief with heating pad with vibrator and massage.  Have encouraged to continue non pharmacological interventions as they do give him relief.  Have also encouraged use of magnesium oil or lotion when massaging legs. He takes sinemet 25/100-- 2tabs in the morning and then 1 tab 4 other times throughout the day.  He takes comtan 200 mg with each dose of sinemet.  Neurologist allows one extra dose of sinemet as needed, especially on days he is more active.  Continue follow up and recommendations by neurology.  3.  Constipation.  Patient has occasional constipation and is encouraged to take Miralax and Senna S daily.  He has only been taking it as needed and will go 2-3 days without a BM.  Have written down instructions for 1 dose of Miralax daily and Senna S 1 tab BID.  His wife put the instructions with his pill box.  Continue to monitor for effectiveness.  4.  Support.  Daughter [name] had called last week and her and her sister have concerns about their parents living alone by themselves.  States that he was found one day sitting in a chair in the living room in nothing but a soiled brief  while his wife was cleaning the bedding in the bedroom.  His wife has early stage of dementia and forgets things such as when he asks for something to eat.  She will go to fix something and then forget what she was doing and not get him something to eat.  They do have [facility] aide that comes to help.  They have cut back on how often this aide comes in the home from 5 days a week to 3 days a week.  They have also cut back on how many hours he is in the home. The wife states that they don't need someone in the home that much.  Have encouraged them to use [facility] to give them more help.  They did mention possibly adding another day per week for him to come in the home and help.  Daughters are concerned that they may need placement in assisted living.  Tried discussing this with the patient and his wife and they do not feel they need this.  Today when I arrived at the home the patient answered the door and he appeared clean, comfortable, and appropriately dressed.  He was in a good mood.  The wife came out a minute later and stated she didn't answer the door because she was washing her face. Today everything appeared fine. Main concern is that they will have good and bad days and on the bad days meds may not be given properly, meals may be skipped, and  unsafe surroundings (such as cords for his heating pad lying in the way) could lead to falls.  Have discussed this situation with our SW and was given a list of assisted living facilities in the area and a list of questions the family can ask while searching for an assisted living.  I have emailed this to [place].  I do have concerns about him taking his medications properly.  I went over at length how each of medications are prescribed and how often to take them.  He does have his meds in pill packs and what is written down on the case it comes in is how he is to properly take his meds.  This will be an ongoing discussion.  Patient and wife are not on board with  assisted living or 24/7 care in the home at this time.  Will encourage daughters to look into this on their own and they may be able to convince them to take that step.  Will continue to follow up every 2-4 weeks to monitor the safety of their living conditions and bring in SW when warranted.    Palliative will continue to follow and make recommendations as needed for symptom management and decline.  I spent 60 minutes providing this consultation. More than 50% of the time in this consultation was spent coordinating communication.   HISTORY OF PRESENT ILLNESS:  YOUNIS FELGAR is a 79 y.o. year old male with multiple medical problems including Parkinson's with some dementia, OA, GERD. Palliative Care was asked to help address goals of care.   CODE STATUS: full code  PPS: 50% HOSPICE ELIGIBILITY/DIAGNOSIS: TBD  PHYSICAL EXAM:   General: NAD, frail appearing, thin Extremities: no edema, no joint deformities Skin: no rashes Neurological: Weakness but otherwise nonfocal   PAST MEDICAL HISTORY:  Past Medical History:  Diagnosis Date  . Arthritis    left hip   . GERD (gastroesophageal reflux disease) Pre 2002   Mild  . History of kidney stones   . 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 Parkinson disease ([place]) 2004   Slowly progressive  . Ruptured appendix teens    SOCIAL HX:  Social History   Tobacco Use  . Smoking status: Never Smoker  . Smokeless tobacco: Never Used  Substance Use Topics  . Alcohol use: Yes    Alcohol/week: 7.0 standard drinks    Types: 7 Standard drinks or equivalent per week    Comment: occassionally    ALLERGIES:  Allergies  Allergen Reactions  . Amoxicillin   . Celebrex [Celecoxib]   . Demerol Other (See Comments)    Hallucinations   . Penicillins     REACTION: RASH     PERTINENT MEDICATIONS:  Outpatient Encounter Medications as of 07/31/2019  Medication Sig  .  carbidopa-levodopa (SINEMET IR) 25-100 MG tablet 2 at 7:30am, 1 at 10am, 1 at 1pm, 1 at 4pm, 1 at 7pm  . clonazePAM (KLONOPIN) 0.5 MG tablet TAKE 1 TO 2 TABLETS AT BEDTIME AS NEEDED  . entacapone (COMTAN) 200 MG tablet Take 1 tablet (200 mg total) by mouth 5 (five) times daily. Take with Levodopa  . gabapentin (NEURONTIN) 100 MG capsule Take 1 capsule (100 mg total) by mouth at bedtime. And 1 twice a day during the day as needed  . polyethylene glycol (MIRALAX / GLYCOLAX) 17 g packet Take 17 g by mouth daily.  . sennosides-docusate sodium (SENOKOT-S) 8.6-50 MG tablet  Take 2 tablets by mouth 2 (two) times daily.   No facility-administered encounter medications on file as of 07/31/2019.     Harel Repetto Jenetta Downer, NP

## 2019-07-31 NOTE — Telephone Encounter (Signed)
Looks like I referred for [facility] on 9/10.  Is this a continuation of that?  If so, that is ok

## 2019-07-31 NOTE — Telephone Encounter (Signed)
Spoke with Sonia Baller she had verbal order to add missed Therapy to this week. Pt will be DC from PT this week as well Per Sonia Baller. She stated that he still has the leg pain but he is very functional, and strong. She Sonia Baller) fills that there isnt anything else they can do for him at this time.

## 2019-07-31 NOTE — Telephone Encounter (Signed)
Sonia Baller from [facility] called needing to speak with a medical staff member regarding orders for this patient. She said the call is somewhat urgent now as the patient is pending discharge this week.

## 2019-08-02 ENCOUNTER — Other Ambulatory Visit: Payer: Self-pay

## 2019-08-02 DIAGNOSIS — G4752 REM sleep behavior disorder: Secondary | ICD-10-CM | POA: Diagnosis not present

## 2019-08-02 DIAGNOSIS — F0280 Dementia in other diseases classified elsewhere without behavioral disturbance: Secondary | ICD-10-CM | POA: Diagnosis not present

## 2019-08-02 DIAGNOSIS — L89622 Pressure ulcer of left heel, stage 2: Secondary | ICD-10-CM | POA: Diagnosis not present

## 2019-08-02 DIAGNOSIS — R1311 Dysphagia, oral phase: Secondary | ICD-10-CM | POA: Diagnosis not present

## 2019-08-02 DIAGNOSIS — G20 Parkinson's disease: Secondary | ICD-10-CM | POA: Diagnosis not present

## 2019-08-02 DIAGNOSIS — I350 Nonrheumatic aortic (valve) stenosis: Secondary | ICD-10-CM | POA: Diagnosis not present

## 2019-08-02 MED ORDER — CARBIDOPA-LEVODOPA 25-100 MG PO TABS: ORAL_TABLET | ORAL | 1 refills | Status: DC

## 2019-08-03 ENCOUNTER — Telehealth: Payer: Self-pay | Admitting: Adult Health Nurse Practitioner

## 2019-08-03 NOTE — Telephone Encounter (Signed)
Spoke with daughter about update on patient visit earlier this week.  Discussed concerns for med management and safety as he requires assistance with ADLs and his wife is having increased forgetfulness.  Discussed SW involvement.  This will be ongoing discussion with patient and his wife and with the family Cathalina Barcia K. Olena Heckle NP

## 2019-08-11 ENCOUNTER — Other Ambulatory Visit: Payer: Self-pay | Admitting: Internal Medicine

## 2019-08-11 NOTE — Telephone Encounter (Signed)
Last filled on 05/29/2019 #90 with 0 refill  LOV 07/04/19 CPE, Next appointment on 01/02/20

## 2019-08-14 DIAGNOSIS — D485 Neoplasm of uncertain behavior of skin: Secondary | ICD-10-CM | POA: Diagnosis not present

## 2019-08-14 DIAGNOSIS — C44311 Basal cell carcinoma of skin of nose: Secondary | ICD-10-CM | POA: Diagnosis not present

## 2019-08-14 DIAGNOSIS — X32XXXA Exposure to sunlight, initial encounter: Secondary | ICD-10-CM | POA: Diagnosis not present

## 2019-08-14 DIAGNOSIS — C44519 Basal cell carcinoma of skin of other part of trunk: Secondary | ICD-10-CM | POA: Diagnosis not present

## 2019-08-14 DIAGNOSIS — D2261 Melanocytic nevi of right upper limb, including shoulder: Secondary | ICD-10-CM | POA: Diagnosis not present

## 2019-08-14 DIAGNOSIS — L570 Actinic keratosis: Secondary | ICD-10-CM | POA: Diagnosis not present

## 2019-08-14 DIAGNOSIS — D044 Carcinoma in situ of skin of scalp and neck: Secondary | ICD-10-CM | POA: Diagnosis not present

## 2019-08-14 DIAGNOSIS — L821 Other seborrheic keratosis: Secondary | ICD-10-CM | POA: Diagnosis not present

## 2019-08-14 DIAGNOSIS — Z85828 Personal history of other malignant neoplasm of skin: Secondary | ICD-10-CM | POA: Diagnosis not present

## 2019-08-14 DIAGNOSIS — D2262 Melanocytic nevi of left upper limb, including shoulder: Secondary | ICD-10-CM | POA: Diagnosis not present

## 2019-08-18 ENCOUNTER — Telehealth: Payer: Self-pay | Admitting: Neurology

## 2019-08-18 ENCOUNTER — Other Ambulatory Visit: Payer: Medicare Other | Admitting: Adult Health Nurse Practitioner

## 2019-08-18 ENCOUNTER — Other Ambulatory Visit: Payer: Self-pay

## 2019-08-18 ENCOUNTER — Other Ambulatory Visit: Payer: Medicare Other

## 2019-08-18 DIAGNOSIS — Z515 Encounter for palliative care: Secondary | ICD-10-CM | POA: Diagnosis not present

## 2019-08-18 DIAGNOSIS — G20 Parkinson's disease: Secondary | ICD-10-CM

## 2019-08-18 NOTE — Telephone Encounter (Signed)
Patient called regarding his batteries for his DBS. He would like a call back regarding how to put them in right. Please Call. Thank you

## 2019-08-18 NOTE — Telephone Encounter (Signed)
Pt is aware but he said that he is just struggling, he is reaching out to his daughters.

## 2019-08-18 NOTE — Progress Notes (Signed)
COMMUNITY PALLIATIVE CARE SW NOTE  PATIENT NAME: Casey Tucker DOB: [birth year] MRN: [id number]  PRIMARY CARE PROVIDER: [name], MD  RESPONSIBLE PARTY:  Acct ID - Guarantor Home Phone Work Phone Relationship Acct Type  [id number] [name]  Self P/F     [address], [place], [place] [address]     PLAN OF CARE and INTERVENTIONS:             1. GOALS OF CARE/ ADVANCE CARE PLANNING:  Patient is FULL CODE. HCPOA is [name] (patient's wife), [name] (patient's daughter) and [name] (patient's daughter). Living Will is complete. Goal is to remain at home and be as independent as possible. 2. SOCIAL/EMOTIONAL/SPIRITUAL ASSESSMENT/ INTERVENTIONS:  SW and RN met with patient and [name] (patient's wife) in the home. Patient was sitting up in chair. Patient showed SW copies of an explanation of benefits from his [place]. SW encouraged patient to keep this letter and wait until he received a letter from [facility]. Patient and [name] agreed. Patient said pain is more managed and he is trying to move around more. Discussed the recent change in pharmacy, and patient said they are not getting the "blister pack" anymore. NP and SW encouraged patient and [name] to talk to pharmacy about this service as it helps with monitoring medications and ensuring that patient is taking the right medication at the right time. Patient and [name] feel that they are managing "fine". [name] said that she helps with meals and will make their food or pick-up to go orders. Discussed the importance of ongoing evaluation of needs in regards to care and the changes that can occur with patient's disease progression. Encouraged ongoing conversations. Discussed safety. Patient and [name] are appreciative of visit and support.  3. PATIENT/CAREGIVER EDUCATION/ COPING:  Patient was alert, oriented. Patient has a flat affect. Patient was calm during visit. Patient denies concerns. Family is supportive and has concerns for patient  and [name] managing their care in the home. SW to follow-up with daughter.  4. PERSONAL EMERGENCY PLAN:  Family/caregiver will call 9-1-1 for emergencies. 5. COMMUNITY RESOURCES COORDINATION/ HEALTH CARE NAVIGATION:  Patient has follow-up with Dr. Carles Collet on 2/22. Patient has received the first COVID-19 vaccine and is scheduled for his second one next week. Patient has an aide with [facility], coming three days a week.  6. FINANCIAL/LEGAL CONCERNS/INTERVENTIONS:  Patient has LTC Set designer. Has accessed this for some respite care and daughters are hopeful that they will utilize this policy for more care in the home. Patient and [name] are unsure at this time.     SOCIAL HX:  Social History   Tobacco Use  . Smoking status: Never Smoker  . Smokeless tobacco: Never Used  Substance Use Topics  . Alcohol use: Yes    Alcohol/week: 7.0 standard drinks    Types: 7 Standard drinks or equivalent per week    Comment: occassionally    CODE STATUS:   Code Status: Prior (FULL) ADVANCED DIRECTIVES: Y MOST FORM COMPLETE:  No. HOSPICE EDUCATION PROVIDED: None.  PPS: Patient is ambulating with his walker. Patient is able to feed himself. Patient needs assistance with tolieting, bathing and dressing -- for safety.   I spent 45 minutes with patient/family, from 4:15-5:00p providing education, support and consultation.   Margaretmary Lombard, LCSW

## 2019-08-18 NOTE — Telephone Encounter (Signed)
Please advise on below  

## 2019-08-18 NOTE — Telephone Encounter (Signed)
For his controllers?  They are just standard AAA (I think) batteries.  If he can't figure it out, his daughters should be able to help with that part.

## 2019-08-19 NOTE — Progress Notes (Signed)
[facility] Palliative Care Consult Note Telephone: [phone]  Fax: [phone]  PATIENT NAME: DASH CARDARELLI DOB: [birth year] MRN: [id number]  PRIMARY CARE PROVIDER:   [name], MD  REFERRING PROVIDER:  [name], MD [place],  [place] [address]  RESPONSIBLE PARTY:  Wife, Suede Greenawalt [phone] Daughter, [name] [phone] (lives in [place])    [place] and PLAN:  Advanced care planning. Patient is full code. According to previous documents patient wants CPR but no prolonged artificial interventions.   This was a joint visit today with SW due to concerns by daughters that the patient and wife may be having difficulties managing on their own.  Did receive a VM from daughter, [name], that they had stopped getting his meds in pill packs.    Have discussed why they stopped the pill packs.  Stated that they went back to their local pharmacy because they knew that the pharmacy knew them and would do right by them.  Have encouraged discussing getting pill packs with this familiar pharmacy.    Upon arriving today patient and wife are doing well and were sorting through stuff in their closet.  Patient is ambulating with his walker with no difficulty.  He states his pain is better controlled.  States that he finds that if he takes one gabapentin 140m when he first gets up, at 12p, and at 4p and 2 caps at bedtime that it seems to help control the pain well.  Have discussed before that this is not how it is prescribed but it does seem to give him good relief of the leg pain.  His next appointment with Dr. TCarles Colletis 09/11/2019.    They are still using [facility] 3 days a week for 4 hours per day. Discussion with patient, wife, and SW including being honest with themselves about their care needs and reassessing their needs to add more care when needed.  Discussed also monitoring when they felt they needed the  most care and arranging caregivers at those times.    Patient and wife appear to be doing well managing household chores and meals and getting supplies in the home.  No real red flags that their needs aren't being met.  Main concern is medication management, SW and I discussed this with them, see above.  SW is to reach out to daughter with update and any advice.  Palliative will continue to monitor for symptom management/decline and make recommendations as needed.  Have next appointment on 2/9/20278.   I spent 60 minutes providing this consultation,  including time with patient/family, chart review, provider coordination, and documentation. More than 50% of the time in this consultation was spent coordinating communication.   HISTORY OF PRESENT ILLNESS:  TPSALM SCHAPPELLis a 79y.o. year old male with multiple medical problems including Parkinson's with some dementia, OA, GERD. Palliative Care was asked to help address goals of care.   CODE STATUS: full code  PPS: 50% HOSPICE ELIGIBILITY/DIAGNOSIS: TBD  PHYSICAL EXAM:  BP 120/54  HR  66  O2 99% on RA General: NAD, frail appearing, thin Cardiovascular: regular rate and rhythm Pulmonary: lung sounds clear; normal respiratory effort Abdomen: soft, nontender, + bowel sounds GU: no suprapubic tenderness Extremities: no edema, no joint deformities Neurological: Weakness but otherwise nonfocal  PAST MEDICAL HISTORY:  Past Medical History:  Diagnosis Date  . Arthritis    left hip   . GERD (gastroesophageal reflux disease) Pre  2002   Mild  . History of kidney stones   . 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 Parkinson disease ([place]) 2004   Slowly progressive  . Ruptured appendix teens    SOCIAL HX:  Social History   Tobacco Use  . Smoking status: Never Smoker  . Smokeless tobacco: Never Used  Substance Use Topics  . Alcohol use: Yes    Alcohol/week:  7.0 standard drinks    Types: 7 Standard drinks or equivalent per week    Comment: occassionally    ALLERGIES:  Allergies  Allergen Reactions  . Amoxicillin   . Celebrex [Celecoxib]   . Demerol Other (See Comments)    Hallucinations   . Penicillins     REACTION: RASH     PERTINENT MEDICATIONS:  Outpatient Encounter Medications as of 08/18/2019  Medication Sig  . carbidopa-levodopa (SINEMET IR) 25-100 MG tablet 2 at 7:30am, 1 at 10am, 1 at 1pm, 1 at 4pm, 1 at 7pm  . clonazePAM (KLONOPIN) 0.5 MG tablet TAKE 1 TO 2 TABLETS AT BEDTIME AS NEEDED  . entacapone (COMTAN) 200 MG tablet Take 1 tablet (200 mg total) by mouth 5 (five) times daily. Take with Levodopa  . gabapentin (NEURONTIN) 100 MG capsule Take 1 capsule by mouth three times daily.  . polyethylene glycol (MIRALAX / GLYCOLAX) 17 g packet Take 17 g by mouth daily.  . sennosides-docusate sodium (SENOKOT-S) 8.6-50 MG tablet Take 2 tablets by mouth 2 (two) times daily.   No facility-administered encounter medications on file as of 08/18/2019.      Ortencia Askari Jenetta Downer, NP

## 2019-08-21 ENCOUNTER — Telehealth: Payer: Self-pay

## 2019-08-21 NOTE — Telephone Encounter (Signed)
SW attempted to contact patient's daughter, [name]. SW left VM with contact information.

## 2019-08-23 ENCOUNTER — Telehealth: Payer: Self-pay

## 2019-08-24 NOTE — Telephone Encounter (Signed)
SW contacted patient's daughter, Casey Tucker to follow-up from visit. Discussed patient's decline and increased needs. Discussed concerns for patient and Casey Tucker (patient's wife) safety. Family is in agreement of the need for additional care but patient and Casey Tucker have been reluctant. Casey Tucker has also had conversations with PCP regarding this. Family wants to support patient and Casey Tucker, and promote independence but recognizes the ongoing issues with needs and Casey Tucker's inability to continue to maintain patient's care. Casey Tucker is interested in a family meeting with patient and Casey Tucker, to discuss more in-depth and work towards establishing a set care plan. SW provided emotional support, discussed care plan and goals, validated concerns and used active and reflective listening. SW to discuss and coordinate with NP.

## 2019-08-25 ENCOUNTER — Telehealth: Payer: Self-pay

## 2019-08-25 NOTE — Telephone Encounter (Signed)
Telephone call to patient to schedule palliative care visit with patient. Patient/family in agreement with home visit on 09-07-19 at 9:30AM.

## 2019-08-29 ENCOUNTER — Telehealth: Payer: Self-pay

## 2019-08-29 NOTE — Telephone Encounter (Signed)
SW received call from [name] (patient's daughter) to discuss ongoing care and safety concerns. [name] is going to coordinate with her sister, and patient's siblings and [name] (patient's wife) siblings to also be able to either be present for the next scheduled visit or be on Zoom. [name] said that family is in agreement of desires for increased care for patient and [name], and are hopeful that they can develop plan for care with support of palliative care team. SW to update NP.

## 2019-08-29 NOTE — Telephone Encounter (Signed)
Phone call placed to patient's wife to inquire if it is ok to reschedule Palliative Care visit until 09/07/2019 @ 9:30am so that daughter, [name], can be a part of the visit. All in agreement that this time will work.

## 2019-09-06 ENCOUNTER — Telehealth: Payer: Self-pay | Admitting: Adult Health Nurse Practitioner

## 2019-09-06 NOTE — Telephone Encounter (Signed)
Called patient to reschedule in home visit in light of the inclement weather advisory.  Stated he thought that would be good and wanted a call back next week to reschedule. Robert Sperl K. Olena Heckle NP

## 2019-09-07 ENCOUNTER — Other Ambulatory Visit: Payer: Self-pay

## 2019-09-07 ENCOUNTER — Other Ambulatory Visit: Payer: Medicare Other | Admitting: Adult Health Nurse Practitioner

## 2019-09-08 ENCOUNTER — Ambulatory Visit: Payer: Medicare Other | Admitting: Urology

## 2019-09-08 NOTE — Progress Notes (Signed)
Casey Tucker was seen today in follow up for Parkinsons disease.  Patient's wife accompanies the patient and supplements the history.  Amantadine was weaned off last visit because of memory change.  My previous records were reviewed prior to todays visit.  Patient did call my office since our last visit.  He did not think his device was working properly.  Turns out that he turned up the voltage on his device, and then did not know how to get it back down.  The [employer] rep ultimately did help with that.  He called at the end of January stating that he could not get the battery in his controllers.  These are just standard AAA batteries, so I told him he needed to reach out to his family (daughters) as these are put in like traditional batteries.   Current prescribed movement disorder medications: Carbidopa/levodopa 25/100, 2 tablets at 7:30 AM/1 tablet at 10 AM/1 tablet at 1 PM/1 tablet at 4 PM/1 tablet at 7 PM (states that he often takes 7 per day) Entacapone 200 mg with each dose of levodopa ("I take that most of the time") Clonazepam (prescribed by primary care) - pt states that he takes 1-2 in the day and 1 at night   PREVIOUS MEDICATIONS:  carbidopa/levodopa; pramipexole; Azilect; Inbrija; amantadine; carbidopa/levodopa 50/200;  ALLERGIES:   Allergies  Allergen Reactions  . Amoxicillin   . Celebrex [Celecoxib]   . Demerol Other (See Comments)    Hallucinations   . Penicillins     REACTION: RASH    CURRENT MEDICATIONS:  Outpatient Encounter Medications as of 09/11/2019  Medication Sig  . carbidopa-levodopa (SINEMET IR) 25-100 MG tablet 2 at 7:30am, 1 at 10am, 1 at 1pm, 1 at 4pm, 1 at 7pm  . clonazePAM (KLONOPIN) 0.5 MG tablet TAKE 1 TO 2 TABLETS AT BEDTIME AS NEEDED  . entacapone (COMTAN) 200 MG tablet Take 1 tablet (200 mg total) by mouth 5 (five) times daily. Take with Levodopa  . gabapentin (NEURONTIN) 100 MG capsule Take 1 capsule by mouth three times daily.  .  polyethylene glycol (MIRALAX / GLYCOLAX) 17 g packet Take 17 g by mouth daily.  . sennosides-docusate sodium (SENOKOT-S) 8.6-50 MG tablet Take 2 tablets by mouth 2 (two) times daily.   No facility-administered encounter medications on file as of 09/11/2019.    PHYSICAL EXAMINATION:    VITALS:   Vitals:   09/11/19 1012  BP: 133/62  Pulse: 69  SpO2: 99%  Weight: 152 lb (68.9 kg)  Height: 6' (1.829 m)    GEN:  The patient appears stated age and is in NAD. HEENT:  Normocephalic, atraumatic.  The mucous membranes are moist. The superficial temporal arteries are without ropiness or tenderness. CV:  RRR Lungs:  CTAB Neck/HEME:  There are no carotid bruits bilaterally.  Neurological examination:  Orientation: The patient is alert and oriented x2.  Supplies most of history very accurately.  Relies on wife for some details. Cranial nerves: There is good facial symmetry with facial hypomimia. The speech is fluent and clear. Soft palate rises symmetrically and there is no tongue deviation. Hearing is intact to conversational tone. Sensation: Sensation is intact to light touch throughout Motor: Strength is at least antigravity x4.  Movement examination: Tone: There is mild increased tone in the LUE Abnormal movements: mild dyskinesia in the L leg only with distraction procedures Coordination:  There is  decremation with RAM's, with any form of RAMS, including alternating supination and pronation of  the forearm, hand opening and closing, finger taps, heel taps and toe taps Gait and Station: The patient uses his walker to ambulate.   ASSESSMENT/PLAN:  1.  Parkinsons Disease  -Patient is status post left ST[facility] DBS at [facility] in August, 2014.  Battery last changed in September, 2018  -Continue carbidopa/levodopa 25/100, 2 tablets at 7:30 AM/1 tablet at 10 AM/1 tablet at 1 PM/1 tablet at 4 PM/1 tablet at 7 PM.  Can take an extra if needed but not daily.  -Discussed entacapone.  Discussed that this  can increase confusion.  We will decrease this to 1 tablet 3 times per day with the first 3 dosages of levodopa. 2.  PDD  -Patient no longer driving  `-Meds should be directly monitored. 3.  Constipation  -discussed nature and pathophysiology and association with PD  -discussed importance of hydration.  Pt is to increase water intake  -pt is given a copy of the rancho recipe  -recommended daily colace  -recommended miralax prn 4.  RBD  -Primary care prescribing clonazepam  -pt states today that he is taking 1-2 klonopin in the day.  PDMP is down today and cannot review it.  Told pt that I don't want him taking this during the day d/t increased risk of falls/confusion.  Patient and wife understand.  5.  Leg pain  -told patient unassociated with Parkinsons Disease.    -told to f/u with PCP  -Wife does state that putting ice or heat on the leg and rubbing it does help.  Total time spent on today's visit was 45 minutes, including both face-to-face time and nonface-to-face time.  Time included that spent on review of records (prior notes available to me/labs/imaging if pertinent), discussing treatment and goals, answering patient's questions and coordinating care.  This did not include DBS time.  Cc:  [name], MD

## 2019-09-11 ENCOUNTER — Other Ambulatory Visit: Payer: Self-pay

## 2019-09-11 ENCOUNTER — Ambulatory Visit (INDEPENDENT_AMBULATORY_CARE_PROVIDER_SITE_OTHER): Payer: Medicare Other | Admitting: Neurology

## 2019-09-11 ENCOUNTER — Encounter: Payer: Self-pay | Admitting: Neurology

## 2019-09-11 VITALS — BP 133/62 | HR 69 | Ht 72.0 in | Wt 152.0 lb

## 2019-09-11 DIAGNOSIS — G20 Parkinson's disease: Secondary | ICD-10-CM

## 2019-09-11 DIAGNOSIS — G249 Dystonia, unspecified: Secondary | ICD-10-CM | POA: Diagnosis not present

## 2019-09-11 DIAGNOSIS — F0280 Dementia in other diseases classified elsewhere without behavioral disturbance: Secondary | ICD-10-CM | POA: Diagnosis not present

## 2019-09-11 NOTE — Patient Instructions (Signed)
1.  Take carbidopa/levodopa 25/100, 2 tablets at 7:30 AM/1 tablet at 10 AM/1 tablet at 1 PM/1 tablet at 4 PM/1 tablet at 7 PM.  Can take an extra if needed but not daily. 2.  DECREASE entacapone (comtan) so that you take ONE tablet with your carbidopa/levodopa 25/100 at 7:30 at, 10am and 1 pm.  You will NOT take entacapone with your 4pm or 7pm dose of carbidopa/levodopa 3.  STOP your daytime use of clonazepam

## 2019-09-11 NOTE — Procedures (Signed)
DBS Programming was performed.    Manufacturer of DBS device: [employer]onic  Total time spent programming was 10 minutes.  Device was confirmed to be on.  Soft start was confirmed to be on.  Impedences were checked and were within normal limits.  Battery was checked and was determined to be functioning normally and not near the end of life.  Final settings were as follows:   Active Contacts Amplitude (V) PW (ms) Frequency (hz) Battery  Left Brain       Group A (active) 2-C+ 3.7 60 135 2.88         Group B (not active) 1-C+ 3.2 90 140          Right Brain       n/a                      Changed programming and turned off patient ability to change settings on group A

## 2019-09-12 ENCOUNTER — Encounter: Payer: Self-pay | Admitting: Urology

## 2019-09-12 NOTE — Telephone Encounter (Signed)
Form completed and signed by Webb Silversmith.

## 2019-09-12 NOTE — Telephone Encounter (Signed)
Please print off the certificate of medical necessity. Answer 1 is NO The rest are yes Have someone sign on my behalf Thanks!

## 2019-09-13 ENCOUNTER — Ambulatory Visit: Payer: Medicare Other | Admitting: Urology

## 2019-09-13 ENCOUNTER — Telehealth: Payer: Self-pay | Admitting: Adult Health Nurse Practitioner

## 2019-09-13 NOTE — Telephone Encounter (Signed)
Called to schedule appointment.  Patient wanted call back in a couple weeks to set up appointment. Cylinda Santoli K. Olena Heckle NP

## 2019-09-18 DIAGNOSIS — C44311 Basal cell carcinoma of skin of nose: Secondary | ICD-10-CM | POA: Diagnosis not present

## 2019-09-25 DIAGNOSIS — C44519 Basal cell carcinoma of skin of other part of trunk: Secondary | ICD-10-CM | POA: Diagnosis not present

## 2019-09-29 ENCOUNTER — Telehealth: Payer: Self-pay

## 2019-09-29 NOTE — Telephone Encounter (Addendum)
SW received call from [name] (patient's daughter) notifying that patient/patient's wife has cancelled home health/in-home services. Concerns noted, and discussed LTC policy. [name] requesting discussion with NP and schedule visit soon. SW to follow-up.

## 2019-10-02 ENCOUNTER — Telehealth: Payer: Self-pay | Admitting: Adult Health Nurse Practitioner

## 2019-10-02 NOTE — Telephone Encounter (Signed)
At the request of Hanley Ben, NP she wanted me to call and schedule a joint Palliative f/u visit with NP and SW.  Spoke with patient's wife and she was in agreement with this and I have scheduled an In-person f/u visit for NP & SW on 10/16/19 @ 1:30 PM

## 2019-10-09 DIAGNOSIS — D044 Carcinoma in situ of skin of scalp and neck: Secondary | ICD-10-CM | POA: Diagnosis not present

## 2019-10-16 ENCOUNTER — Other Ambulatory Visit: Payer: Self-pay

## 2019-10-16 ENCOUNTER — Other Ambulatory Visit: Payer: Medicare Other | Admitting: Adult Health Nurse Practitioner

## 2019-10-16 ENCOUNTER — Other Ambulatory Visit: Payer: Medicare Other

## 2019-10-16 DIAGNOSIS — Z515 Encounter for palliative care: Secondary | ICD-10-CM

## 2019-10-16 DIAGNOSIS — G20 Parkinson's disease: Secondary | ICD-10-CM

## 2019-10-16 NOTE — Progress Notes (Addendum)
COMMUNITY PALLIATIVE CARE SW NOTE  PATIENT NAME: Casey Tucker DOB: August 05, 1940 MRN: [id number]  PRIMARY CARE PROVIDER: [name], MD  RESPONSIBLE PARTY:  Acct ID - Guarantor Home Phone Work Phone Relationship Acct Type  [id number] [name]  Self P/F     [place], [place],  [address]     PLAN OF CARE and INTERVENTIONS:             1. GOALS OF CARE/ ADVANCE CARE PLANNING:  Patient does want CPR, but no prolonged artificial interventions. HCPOA is [name] (patient's wife), [name] (patient's daughter) and [name] (patient's daughter). Living Will is complete. Goal is to remain at home and be as independent as possible. Ongoing discussion with providers about in-home needs and plan of care.  2. SOCIAL/EMOTIONAL/SPIRITUAL ASSESSMENT/ INTERVENTIONS:  SW and Amy, NP met with patient and [name] (patient's wife) in the home. Discussed patient's daily routine. Patient reports leg pain, does use Tylenol. [name] helps with ice and massage. Patient said he is sleeping well, sleeps for about 12 hours each night. Patient noted episodes of incontinence during the night. Patient said his appetite is good. Patient and [name] seemed more motivated and focused on managing care today. Patient denies concerns. Patient and [name] welcoming of team. Patient was dressed, sitting in chair in his room. NP reviewed and discussed medications, and provided education on disease progression. SW provided emotional support, discussed care options and used active and reflective listening. 3. PATIENT/CAREGIVER EDUCATION/ COPING:  Patient was alert, engaged and more talkative during this visit. Patient has flat affect. Patient was calm, pleasant. Patient denies any coping concerns. Patient seems content with current situation and hopes to maintain. Patient feels that he and [name] are managing "okay" in the home. Family is supportive, and would like consistent care for patient and [name] but is respectful of their  wishes. 4. PERSONAL EMERGENCY PLAN:  Family will call 9-1-1 for emergencies. 5. COMMUNITY RESOURCES COORDINATION/ HEALTH CARE NAVIGATION:  No upcoming appointments. Patient and [name] have "paused" [facility] aides at this time.  6. FINANCIAL/LEGAL CONCERNS/INTERVENTIONS:  Patient has LTC Set designer. Patient mentioned that he does not feel he needs a consistently scheduled in-home aide. Patient is open to a PRN aide to come for doctor's appointments, etc. Patient aware that he may need increased care as disease progresses.     SOCIAL HX:  Social History   Tobacco Use  . Smoking status: Never Smoker  . Smokeless tobacco: Never Used  Substance Use Topics  . Alcohol use: Yes    Alcohol/week: 7.0 standard drinks    Types: 7 Standard drinks or equivalent per week    Comment: occassionally    CODE STATUS:   Code Status: Prior (FULL) ADVANCED DIRECTIVES: Y MOST FORM COMPLETE:  No. HOSPICE EDUCATION PROVIDED: None.  PPS: Patient is ambulating with his walker. Patient is able to feed himself. Patient needs assistance with tolieting, bathing and dressing.   I spent51mnutes with patient/family, from1:10-1:55pproviding education, support and consultation.  WMargaretmary Lombard LCSW

## 2019-10-16 NOTE — Progress Notes (Signed)
[facility] Palliative Care Consult Note Telephone: [phone]  Fax: [phone]  PATIENT NAME: Casey Tucker DOB: 06/18/1941 MRN: [id number]  PRIMARY CARE PROVIDER:   [name], MD  REFERRING PROVIDER:  [name], MD [place],  [place] [address]  RESPONSIBLE PARTY:   Wife, Alejando Kopper [id number] Daughter, [name] [phone] (lives in [place])       [place] and PLAN:  1.  Advanced care planning. Patient is full code. According to previous documents patient wants CPR but no prolonged artificial interventions.  2.  Parkinson's.  Patient is able to ambulate with a walker. He requires assistance with ADLs.  Patient still has leg pain.  States that the gabapentin makes him sleepy and has been using Tylenol with some relief.  Also gets relief with hot/cold treatments and massage.   Have suggested that he could schedule Tylenol to get better round the clock coverage.   Not to take more than 3000 mg acetaminophen in a 24 hour period.  Have also suggested using topicals such as capsaicin cream or magnesium oil to help with the pain. He is sleeping more and having more incontinent episodes at night.  He takes sinemet 25/100-- 2tabs in the morning and then 1 tab 4 other times throughout the day.  His comtan 200mg  has been reduced to three times a day with the sinemet.  Neurologist allows one extra dose of sinemet as needed, especially on days he is more active.  Continue follow up and recommendations by neurology.  3.  Constipation.  States that taking milk of magnesia as needed has helped.  States that if he has not had a BM in 2 days he takes the milk of magnesia with good results.  Continue with milk of magnesia as needed  4.  Nutrition.  Patient states that his appetite has improved and he has gained weight.  States that he had lost down to 141 pounds and now weighs 152.    5.  Support.  This was a  joint visit with SW. Patient and wife do not currently have in home help with [employer].  They have stopped this as they thought they did not need someone there as often as the aides were there.  Aides were coming in 4 hours a day 3 days a week.  They also felt unsafe as one of the aides had a family member with [place].  Patient and wife felt like they would rather use the money for in home help when they needed it more and are aware that in the future he will require more help than she will be able to provide.  Today upon arrival both patient and wife are clean and appropriately dressed.  House is clean (do have house keeper that comes in once a week).  Medications are in pill boxes and appear to be taking appropriately today.  Though there have been concerns in the past about patient properly taking his meds.   Palliative will continue to monitor for symptom management/decline and make recommendations as needed.  Declined to schedule follow up appointment today but agreed to phone call in a month to set up follow up appointment if needed.   I spent 90 minutes providing this consultation,  from 1:15 to 2:45 including time with patient/family, chart review, provider coordination, and documentation. More than 50% of the time in this consultation was spent coordinating communication.   HISTORY OF PRESENT ILLNESS:  Casey Tucker is a 79 y.o. year old male with multiple medical problems including Parkinson's with some dementia, OA, GERD. Palliative Care was asked to help address goals of care.   CODE STATUS: full code  PPS: 50% HOSPICE ELIGIBILITY/DIAGNOSIS: TBD  PHYSICAL EXAM:  BP 102/58  HR  65  O2 98% on RA General: NAD, frail appearing, thin Cardiovascular: regular rate and rhythm Pulmonary: lung sounds clear; normal respiratory effort Abdomen: soft, nontender, + bowel sounds GU: no suprapubic tenderness Extremities: no edema, arthritic changes to hands Neurological: Weakness but otherwise  nonfocal  PAST MEDICAL HISTORY:  Past Medical History:  Diagnosis Date  . Arthritis    left hip   . GERD (gastroesophageal reflux disease) Pre 2002   Mild  . History of kidney stones   . 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 Parkinson disease ([place]) 2004   Slowly progressive  . Ruptured appendix teens    SOCIAL HX:  Social History   Tobacco Use  . Smoking status: Never Smoker  . Smokeless tobacco: Never Used  Substance Use Topics  . Alcohol use: Yes    Alcohol/week: 7.0 standard drinks    Types: 7 Standard drinks or equivalent per week    Comment: occassionally    ALLERGIES:  Allergies  Allergen Reactions  . Amoxicillin   . Celebrex [Celecoxib]   . Demerol Other (See Comments)    Hallucinations   . Penicillins     REACTION: RASH     PERTINENT MEDICATIONS:  Outpatient Encounter Medications as of 10/16/2019  Medication Sig  . carbidopa-levodopa (SINEMET IR) 25-100 MG tablet 2 at 7:30am, 1 at 10am, 1 at 1pm, 1 at 4pm, 1 at 7pm  . clonazePAM (KLONOPIN) 0.5 MG tablet TAKE 1 TO 2 TABLETS AT BEDTIME AS NEEDED  . entacapone (COMTAN) 200 MG tablet Take 1 tablet (200 mg total) by mouth 5 (five) times daily. Take with Levodopa  . gabapentin (NEURONTIN) 100 MG capsule Take 1 capsule by mouth three times daily.  . polyethylene glycol (MIRALAX / GLYCOLAX) 17 g packet Take 17 g by mouth daily.  . sennosides-docusate sodium (SENOKOT-S) 8.6-50 MG tablet Take 2 tablets by mouth 2 (two) times daily.   No facility-administered encounter medications on file as of 10/16/2019.     Lijah Bourque Jenetta Downer, NP

## 2019-10-31 ENCOUNTER — Telehealth: Payer: Self-pay | Admitting: Internal Medicine

## 2019-10-31 NOTE — Telephone Encounter (Signed)
Patient called today asking for advice  He stated that in his record it is recorded that he has trouble with Extreme Bladder control. Patient has noticed that around 4 am it is the worse. HE is not sure what he should do. Patient would like to know if you have any recommendations

## 2019-10-31 NOTE — Telephone Encounter (Signed)
Pt left v/m wanting to know if Dr Silvio Pate would consider prescribing Myrbetriq for over active bladder. Pt request cb after Dr Silvio Pate reviews.

## 2019-11-01 NOTE — Telephone Encounter (Signed)
Spoke to pt. He does not want to wait until June. Made him an appt for tomorrow.

## 2019-11-01 NOTE — Telephone Encounter (Signed)
I would not be a fan of myrbetriq for him---but we can discuss options for  the urinary problems at his upcoming June visit (or sooner if he wants to come in before that)

## 2019-11-02 ENCOUNTER — Ambulatory Visit (INDEPENDENT_AMBULATORY_CARE_PROVIDER_SITE_OTHER): Payer: Medicare Other | Admitting: Internal Medicine

## 2019-11-02 ENCOUNTER — Encounter: Payer: Self-pay | Admitting: Internal Medicine

## 2019-11-02 ENCOUNTER — Other Ambulatory Visit: Payer: Self-pay

## 2019-11-02 DIAGNOSIS — N39498 Other specified urinary incontinence: Secondary | ICD-10-CM | POA: Diagnosis not present

## 2019-11-02 NOTE — Progress Notes (Signed)
Subjective:    Patient ID: Casey Tucker, male    DOB: 1940/08/01, 79 y.o.   MRN: [id number]  HPI Here due to urinary concerns--with wife This visit occurred during the SARS-CoV-2 public health emergency.  Safety protocols were in place, including screening questions prior to the visit, additional usage of staff PPE, and extensive cleaning of exam room while observing appropriate contact time as indicated for disinfecting solutions.   Having nocturia between 2-4AM---actually involuntary incontinence in bed Wearing pull up ----sometimes leaks out Uses pad on bed Has been going on for a year or so Has bedside commode but sometimes can't move well enough to make it there before going  Voids okay Stream is fine Gets typical urgency and is able to make it to the bathroom fine Does feel that he can empty completely Had low PVR at the urologist's office Did try solifenacin from Dr Lavenia Atlas it made things worse Brief trial DDAVP didn't do anything either  Current Outpatient Medications on File Prior to Visit  Medication Sig Dispense Refill  . carbidopa-levodopa (SINEMET IR) 25-100 MG tablet 2 at 7:30am, 1 at 10am, 1 at 1pm, 1 at 4pm, 1 at 7pm 540 tablet 1  . clonazePAM (KLONOPIN) 0.5 MG tablet TAKE 1 TO 2 TABLETS AT BEDTIME AS NEEDED 60 tablet 0  . entacapone (COMTAN) 200 MG tablet Take 1 tablet (200 mg total) by mouth 5 (five) times daily. Take with Levodopa 450 tablet 1  . gabapentin (NEURONTIN) 100 MG capsule Take 1 capsule by mouth three times daily. 90 capsule 3  . polyethylene glycol (MIRALAX / GLYCOLAX) 17 g packet Take 17 g by mouth daily.    . sennosides-docusate sodium (SENOKOT-S) 8.6-50 MG tablet Take 2 tablets by mouth 2 (two) times daily.     No current facility-administered medications on file prior to visit.    Allergies  Allergen Reactions  . Amoxicillin   . Celebrex [Celecoxib]   . Demerol Other (See Comments)    Hallucinations   . Penicillins     REACTION:  RASH    Past Medical History:  Diagnosis Date  . Arthritis    left hip   . GERD (gastroesophageal reflux disease) Pre 2002   Mild  . History of kidney stones   . 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 Parkinson disease ([facility]) 2004   Slowly progressive  . Ruptured appendix teens    Past Surgical History:  Procedure Laterality Date  . APPENDECTOMY    . CATARACT EXTRACTION W/ INTRAOCULAR LENS  IMPLANT, BILATERAL  2017  . DEEP BRAIN STIMULATOR PLACEMENT  8/14   L STN  . DOPPLER ECHOCARDIOGRAPHY  01/10/2009   LV NML Mild LVH EF 60-65% aortic sclerosis w/0 stenosis   . JOINT REPLACEMENT     left hip replacement 08/14/11/[facility]   . SUBTHALAMIC STIMULATOR BATTERY REPLACEMENT Left 03/25/2017   Procedure: Deep Brain Stimulator battery replacement;  Surgeon: Erline Levine, MD;  Location: [facility];  Service: Neurosurgery;  Laterality: Left;  left  . TONSILLECTOMY    . TOTAL HIP REVISION  08/27/2011   Procedure: TOTAL HIP REVISION;  Surgeon: Mauri Pole, MD;  Location: [facility];  Service: Orthopedics;  Laterality: Left;    Family History  Problem Relation Age of Onset  . Arthritis Mother        knee replacement  . COPD Father        emphysema, smoker  .  Heart disease Father        CHF  . Healthy Sister   . Alcohol abuse Paternal Uncle   . Rheum arthritis Daughter     Social History   Socioeconomic History  . Marital status: Married    Spouse name: Not on file  . Number of children: 2  . Years of education: Not on file  . Highest education level: Doctorate  Occupational History  . Occupation: Optometrist    Comment: medically retired  Tobacco Use  . Smoking status: Never Smoker  . Smokeless tobacco: Never Used  Substance and Sexual Activity  . Alcohol use: Yes    Alcohol/week: 7.0 standard drinks    Types: 7 Standard drinks or equivalent per week    Comment: occassionally  . Drug use: No  .  Sexual activity: Yes  Other Topics Concern  . Not on file  Social History Narrative   Married, lives with wife   2 daughters   Right handed   Has living will   Wife has health care POA---then daughters   Would still accept CPR--but no prolonged artificial means (ventilator or tube feeds)   Social Determinants of Health   Financial Resource Strain:   . Difficulty of Paying Living Expenses:   Food Insecurity:   . Worried About Charity fundraiser in the Last Year:   . Arboriculturist in the Last Year:   Transportation Needs:   . Film/video editor (Medical):   Marland Kitchen Lack of Transportation (Non-Medical):   Physical Activity:   . Days of Exercise per Week:   . Minutes of Exercise per Session:   Stress:   . Feeling of Stress :   Social Connections:   . Frequency of Communication with Friends and Family:   . Frequency of Social Gatherings with Friends and Family:   . Attends Religious Services:   . Active Member of Clubs or Organizations:   . Attends Archivist Meetings:   Marland Kitchen Marital Status:   Intimate Partner Violence:   . Fear of Current or Ex-Partner:   . Emotionally Abused:   Marland Kitchen Physically Abused:   . Sexually Abused:    Review of Systems Eating okay Weight up 12# from the nadir Has help weekly for housework Cut back on some of the aides though---"that was too much" per wife    Objective:   Physical Exam  Constitutional: No distress.  GI: Soft. He exhibits no distension. There is no abdominal tenderness.  No suprapubic tenderness           Assessment & Plan:

## 2019-11-02 NOTE — Assessment & Plan Note (Addendum)
Goes back a year Doesn't seem to be typical type of incontinence Failed vesicare No daytime problems Apparently no retention per testing by Dr Junious Silk  Will check with Dr Tat to see if there are any options for this with PD Otherwise, asked him to try urinal in bed Explained that I don't think mybetriq wil help Continue pullups and pad  Reviewed situation with Dr Tat She felt that a trial of myrbetriq would be worthwhile Will proceed with Rx and see how he does

## 2019-11-03 MED ORDER — MIRABEGRON ER 25 MG PO TB24: 25.0000 mg | ORAL_TABLET | Freq: Every day | ORAL | 2 refills | Status: DC

## 2019-11-03 NOTE — Addendum Note (Signed)
Addended by: [name] on: 11/03/2019 07:30 AM   Modules accepted: Orders

## 2019-11-06 ENCOUNTER — Other Ambulatory Visit: Payer: Self-pay | Admitting: Internal Medicine

## 2019-11-30 DIAGNOSIS — Z961 Presence of intraocular lens: Secondary | ICD-10-CM | POA: Diagnosis not present

## 2019-11-30 DIAGNOSIS — H35371 Puckering of macula, right eye: Secondary | ICD-10-CM | POA: Diagnosis not present

## 2019-11-30 DIAGNOSIS — H353131 Nonexudative age-related macular degeneration, bilateral, early dry stage: Secondary | ICD-10-CM | POA: Diagnosis not present

## 2019-12-07 ENCOUNTER — Telehealth: Payer: Self-pay | Admitting: Neurology

## 2019-12-07 NOTE — Telephone Encounter (Signed)
Spoke with patients the Carbidopa-Levodop is not lasting the entire time. He wants to know if there is something Dr Tat can do.

## 2019-12-07 NOTE — Telephone Encounter (Signed)
Patient notified and stated ok. Advised patient to reach out to his pcp and see what he can do. He voiced understanding.

## 2019-12-07 NOTE — Telephone Encounter (Signed)
See last notes re: leg pain.  Pt may need pain management doc

## 2019-12-07 NOTE — Telephone Encounter (Signed)
Pt is having really bad pain in the legs and wants to speak to someone about what we can do to help with this

## 2019-12-11 ENCOUNTER — Other Ambulatory Visit: Payer: Self-pay | Admitting: Internal Medicine

## 2019-12-11 NOTE — Telephone Encounter (Signed)
Refill request Gabapentin Last refill 08/11/19 #90/3 Last office visit 11/02/19

## 2019-12-11 NOTE — Telephone Encounter (Signed)
Please find out what he is actually taking--these instructions are too vague and not sure why #300 put in as quantity

## 2019-12-12 NOTE — Telephone Encounter (Signed)
Spoke to pt. He said these were the directions that were agreed upon awhile back. He takes it up to 4 times a day and 2 at bedtime. Sometimes he takes 1 and other times he takes 2 during the day.

## 2019-12-13 ENCOUNTER — Telehealth: Payer: Self-pay | Admitting: Family Medicine

## 2019-12-13 NOTE — Telephone Encounter (Signed)
Patient called with complaints of over active bladder and urinary incontinence. He has not been seen in our office in over 1 year. Patient is currently on Myrbetriq 25mg . An appointment was made for an office visit.

## 2019-12-15 ENCOUNTER — Telehealth: Payer: Self-pay

## 2019-12-15 NOTE — Telephone Encounter (Signed)
Phone call placed to patient to speak with his daughter,Lori, to check in and offer to schedule a follow up visit with Palliative care. Unable to leave message

## 2019-12-20 ENCOUNTER — Ambulatory Visit: Payer: Medicare Other | Admitting: Urology

## 2019-12-25 ENCOUNTER — Telehealth: Payer: Self-pay

## 2019-12-25 NOTE — Telephone Encounter (Signed)
Telephone call to patient to schedule palliative care visit.  Patient in agreement with RN making home visit on Monday 01/01/20 at 1:00 PM.

## 2019-12-29 ENCOUNTER — Ambulatory Visit: Payer: BLUE CROSS/BLUE SHIELD | Admitting: Urology

## 2019-12-31 ENCOUNTER — Telehealth: Payer: Self-pay

## 2019-12-31 NOTE — Telephone Encounter (Signed)
Error no message left for this patient.  Visit scheduled for 01/01/20 at 1:00 PM.

## 2020-01-01 ENCOUNTER — Other Ambulatory Visit: Payer: Self-pay

## 2020-01-01 ENCOUNTER — Telehealth: Payer: Self-pay

## 2020-01-01 ENCOUNTER — Other Ambulatory Visit: Payer: Medicare Other

## 2020-01-01 ENCOUNTER — Telehealth: Payer: Self-pay | Admitting: Neurology

## 2020-01-01 NOTE — Telephone Encounter (Signed)
Pt said palliative nurse came out today and suggested taking Kynmobi for  Parkinson Disease. Pt request Kynmobi sent to [facility]. Pt has a 6 mth FU with Dr Silvio Pate on 01/02/20 at 10:30 but wants med sent to [facility] today. I advised I would send note and pt will ck with pharmacy later today but if does not get sent in today to talk with Dr Silvio Pate at pt appt on 01/02/20. FYI to Dr Silvio Pate.

## 2020-01-01 NOTE — Telephone Encounter (Signed)
Spoke to pt. He will call Dr Doristine Devoid office.

## 2020-01-01 NOTE — Telephone Encounter (Signed)
Line busy

## 2020-01-01 NOTE — Progress Notes (Signed)
PATIENT NAME: Casey Tucker DOB: October 05, 1940 MRN: [id number]  PRIMARY CARE PROVIDER: [name], MD  RESPONSIBLE PARTY:  Acct ID - Guarantor Home Phone Work Phone Relationship Acct Type  [id number] [name]  Self P/F     [address], [place], [place] [address]    PLAN OF CARE and INTERVENTIONS:               1.  GOALS OF CARE/ ADVANCE CARE PLANNING:  Remain in home wife as long as care can be managed.                    2.  PATIENT/CAREGIVER EDUCATION:  Education on fall precautions, education on palliative care services, reviewed meds               3.  DISEASE STATUS:  RN made scheduled palliative care home visit. Nurse met with patient.  Patients Livette left shortly after nurse arrived. Patient reports he is having a lag time for his medication to begin working and medication wears off before he is scheduled for his next dose.  Patient also complains of over active bladder and states MD is aware.  Patient reports he is scheduled to see Dr Silvio Pate tomorrow.  Patient talks with RN asking about meds that may help with rigidity, stiffness and poor balance.  RN informed patient to talk with MD and the only medication RN is aware of for PRN use is Kynmobi.  RN informed patient he would need to discuss this with his MD and MD may feel this medication would not be appropriate for patient.   Patient uses rolling walker when he ambulates. Patient did get up to  answer phone walking to side of his bed without using his walker. Patient has shuffling gait and will pause when ambulating before he can begin ambulating again. Patient reports he suffered a fall in the bathroom yesterday. Patient suffered a skin tear on left upper forearm. Patient has dressing covering area. Patient reports his appetite is pretty good and patient drinks a boost everyday. Patient reports Gabapentin is controlling his pain. Patient reports he sleeps well at night and takes Gabapentin before bedtime.  Patient  denies having any shortness of breath or cough. Patient having increased episodes of urinary incontinence during the night. Wife reports patient is falling more frequently. Patient has no edema in his lower extremities. Nurse reviewed patient's medications with patient. Patient currently has no hired caregivers in the home as wife felt they upset household routine. Patient remains in agreement with palliative care services. Patient encouraged to contact palliative care with questions or concerns.      HISTORY OF PRESENT ILLNESS:  Patient is a 79 year old patient with Parkinson's disease.  Patient is followed by palliative care and is seen monthly and PRN.   CODE STATUS: Full Code  ADVANCED DIRECTIVES: Y HCPOA MOST FORM: No PPS: 50%   PHYSICAL EXAM:   VITALS: Today's Vitals   01/01/20 1328  BP: 110/60  Pulse: 85  Resp: 16  Temp: 97.9 F (36.6 C)  TempSrc: Temporal  SpO2: 94%  PainSc: 0-No pain    LUNGS: clear to auscultation  CARDIAC: Cor RRR  EXTREMITIES: none edema SKIN: skin tear to right forearm, area covered with large band-Aid  NEURO: positive for gait problems and memory problems       Nilda Simmer, RN

## 2020-01-01 NOTE — Telephone Encounter (Signed)
Patient called in and left a message wanting to talk to someone about changing his medication.

## 2020-01-01 NOTE — Telephone Encounter (Signed)
I have a hard time believing that the palliative nurse would recommend this medication (and I don't see a note from her). Please let him know that any medication for his Parkinson's should come from Dr Tat

## 2020-01-02 ENCOUNTER — Ambulatory Visit: Payer: Medicare Other | Admitting: Internal Medicine

## 2020-01-02 NOTE — Telephone Encounter (Signed)
Spoke with pt, he was wanting information on a new medication that he couldn't remember the name of, he stated he was going to call another doctor about it.

## 2020-01-12 ENCOUNTER — Other Ambulatory Visit: Payer: Self-pay

## 2020-01-12 ENCOUNTER — Ambulatory Visit (INDEPENDENT_AMBULATORY_CARE_PROVIDER_SITE_OTHER): Payer: Medicare Other | Admitting: Internal Medicine

## 2020-01-12 ENCOUNTER — Encounter: Payer: Self-pay | Admitting: Internal Medicine

## 2020-01-12 DIAGNOSIS — F39 Unspecified mood [affective] disorder: Secondary | ICD-10-CM

## 2020-01-12 DIAGNOSIS — M48062 Spinal stenosis, lumbar region with neurogenic claudication: Secondary | ICD-10-CM | POA: Diagnosis not present

## 2020-01-12 DIAGNOSIS — E441 Mild protein-calorie malnutrition: Secondary | ICD-10-CM

## 2020-01-12 MED ORDER — GABAPENTIN 100 MG PO CAPS: 100.0000 mg | ORAL_CAPSULE | Freq: Three times a day (TID) | ORAL | 0 refills | Status: DC

## 2020-01-12 NOTE — Assessment & Plan Note (Signed)
Still with ongoing pain---neuropathic Not taking the gabapentin correctly Asked him to take 100mg  tid (with meals) and then 200mg  at bedtime Would consider adding duloxetine if that isn't effective

## 2020-01-12 NOTE — Patient Instructions (Signed)
Please take the gabapentin 100mg  three times a day (around meal times) and 200mg  (2 capsules) at bedtime. If the nerve pain continues, I would like to try another medication (duloxetine 30mg )

## 2020-01-12 NOTE — Assessment & Plan Note (Signed)
Still with dysthymia, denial of condition, etc Duloxetine might help--but for now will hold off (unless neuropathy pain is not better controlled)

## 2020-01-12 NOTE — Assessment & Plan Note (Signed)
Weight has stabilized at least Seems to be eating some better

## 2020-01-12 NOTE — Progress Notes (Signed)
Subjective:    Patient ID: Casey Tucker, male    DOB: 03-May-1941, 79 y.o.   MRN: [id number]  HPI Here due to ongoing neuropathic pain in legs With wife This visit occurred during the SARS-CoV-2 public health emergency.  Safety protocols were in place, including screening questions prior to the visit, additional usage of staff PPE, and extensive cleaning of exam room while observing appropriate contact time as indicated for disinfecting solutions.   Having more pain in his legs Takes gabapentin 100mg  with some relief--but it wears off Takes 2-3 times a day--but wife thinks he may be taking just in the AM (and then at bedtime) Does get better with massage/cream Has sudden onset of pains though--hard to predict More of an issue in the past month  Current Outpatient Medications on File Prior to Visit  Medication Sig Dispense Refill  . carbidopa-levodopa (SINEMET IR) 25-100 MG tablet 2 at 7:30am, 1 at 10am, 1 at 1pm, 1 at 4pm, 1 at 7pm 540 tablet 1  . clonazePAM (KLONOPIN) 0.5 MG tablet TAKE 1 TO 2 TABLETS AT BEDTIME AS NEEDED 60 tablet 0  . entacapone (COMTAN) 200 MG tablet Take 1 tablet (200 mg total) by mouth 5 (five) times daily. Take with Levodopa 450 tablet 1  . gabapentin (NEURONTIN) 100 MG capsule TAKE 1-2 CAPSULES BY MOUTH UP TO 4 TIMESDAILY AS NEEDED AND 2 CAPSULES AT BEDTIME 300 capsule 2  . polyethylene glycol (MIRALAX / GLYCOLAX) 17 g packet Take 17 g by mouth daily.    . sennosides-docusate sodium (SENOKOT-S) 8.6-50 MG tablet Take 2 tablets by mouth 2 (two) times daily.     No current facility-administered medications on file prior to visit.    Allergies  Allergen Reactions  . Amoxicillin   . Celebrex [Celecoxib]   . Demerol Other (See Comments)    Hallucinations   . Penicillins     REACTION: RASH    Past Medical History:  Diagnosis Date  . Arthritis    left hip   . GERD (gastroesophageal reflux disease) Pre 2002   Mild  . History of kidney stones   .  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 Parkinson disease ([place]) 2004   Slowly progressive  . Ruptured appendix teens    Past Surgical History:  Procedure Laterality Date  . APPENDECTOMY    . CATARACT EXTRACTION W/ INTRAOCULAR LENS  IMPLANT, BILATERAL  2017  . DEEP BRAIN STIMULATOR PLACEMENT  8/14   L STN  . DOPPLER ECHOCARDIOGRAPHY  01/10/2009   LV NML Mild LVH EF 60-65% aortic sclerosis w/0 stenosis   . JOINT REPLACEMENT     left hip replacement 08/14/11/[place]   . SUBTHALAMIC STIMULATOR BATTERY REPLACEMENT Left 03/25/2017   Procedure: Deep Brain Stimulator battery replacement;  Surgeon: Erline Levine, MD;  Location: [facility];  Service: Neurosurgery;  Laterality: Left;  left  . TONSILLECTOMY    . TOTAL HIP REVISION  08/27/2011   Procedure: TOTAL HIP REVISION;  Surgeon: Mauri Pole, MD;  Location: [facility];  Service: Orthopedics;  Laterality: Left;    Family History  Problem Relation Age of Onset  . Arthritis Mother        knee replacement  . COPD Father        emphysema, smoker  . Heart disease Father        CHF  . Healthy Sister   . Alcohol abuse Paternal Uncle   .  Rheum arthritis Daughter     Social History   Socioeconomic History  . Marital status: Married    Spouse name: Not on file  . Number of children: 2  . Years of education: Not on file  . Highest education level: Doctorate  Occupational History  . Occupation: Optometrist    Comment: medically retired  Tobacco Use  . Smoking status: Never Smoker  . Smokeless tobacco: Never Used  Vaping Use  . Vaping Use: Never used  Substance and Sexual Activity  . Alcohol use: Yes    Alcohol/week: 7.0 standard drinks    Types: 7 Standard drinks or equivalent per week    Comment: occassionally  . Drug use: No  . Sexual activity: Yes  Other Topics Concern  . Not on file  Social History Narrative   Married, lives with wife   2 daughters    Right handed   Has living will   Wife has health care POA---then daughters   Would still accept CPR--but no prolonged artificial means (ventilator or tube feeds)   Social Determinants of Health   Financial Resource Strain:   . Difficulty of Paying Living Expenses:   Food Insecurity:   . Worried About Charity fundraiser in the Last Year:   . Arboriculturist in the Last Year:   Transportation Needs:   . Film/video editor (Medical):   Marland Kitchen Lack of Transportation (Non-Medical):   Physical Activity:   . Days of Exercise per Week:   . Minutes of Exercise per Session:   Stress:   . Feeling of Stress :   Social Connections:   . Frequency of Communication with Friends and Family:   . Frequency of Social Gatherings with Friends and Family:   . Attends Religious Services:   . Active Member of Clubs or Organizations:   . Attends Archivist Meetings:   Marland Kitchen Marital Status:   Intimate Partner Violence:   . Fear of Current or Ex-Partner:   . Emotionally Abused:   Marland Kitchen Physically Abused:   . Sexually Abused:    Review of Systems Not much response with the myrbetriq--seems to have stopped it Appetite is okay Is holding with his weight    Objective:   Physical Exam  Constitutional: No distress.  Mild wasting still evident  Cardiovascular: Normal rate, regular rhythm and normal pulses. Exam reveals no gallop.  No murmur heard. Respiratory: Effort normal and breath sounds normal. He has no wheezes. He has no rales.  Musculoskeletal:     Right lower leg: No edema.     Left lower leg: No edema.  Neurological:  Mumbling speech Clear memory problems           Assessment & Plan:

## 2020-01-18 ENCOUNTER — Emergency Department: Payer: Medicare Other

## 2020-01-18 ENCOUNTER — Other Ambulatory Visit: Payer: Self-pay

## 2020-01-18 ENCOUNTER — Emergency Department
Admission: EM | Admit: 2020-01-18 | Discharge: 2020-01-19 | Disposition: A | Discharge status: Home / Self Care | Payer: Medicare Other | Attending: Emergency Medicine | Admitting: Emergency Medicine

## 2020-01-18 DIAGNOSIS — Z96642 Presence of left artificial hip joint: Secondary | ICD-10-CM | POA: Diagnosis not present

## 2020-01-18 DIAGNOSIS — G20 Parkinson's disease: Secondary | ICD-10-CM | POA: Diagnosis not present

## 2020-01-18 DIAGNOSIS — R55 Syncope and collapse: Secondary | ICD-10-CM | POA: Diagnosis not present

## 2020-01-18 DIAGNOSIS — R519 Headache, unspecified: Secondary | ICD-10-CM | POA: Diagnosis not present

## 2020-01-18 DIAGNOSIS — F0281 Dementia in other diseases classified elsewhere with behavioral disturbance: Secondary | ICD-10-CM | POA: Diagnosis not present

## 2020-01-18 LAB — COMPREHENSIVE METABOLIC PANEL
ALT: <5 U/L (ref 0–44)
AST: 9 U/L — ABNORMAL LOW (ref 15–41)
Albumin: 3.2 g/dL — ABNORMAL LOW (ref 3.5–5.0)
Alkaline Phosphatase: 42 U/L (ref 38–126)
Anion gap: 4 — ABNORMAL LOW (ref 5–15)
BUN: 22 mg/dL (ref 8–23)
CO2: 29 mmol/L (ref 22–32)
Calcium: 7.6 mg/dL — ABNORMAL LOW (ref 8.9–10.3)
Chloride: 105 mmol/L (ref 98–111)
Creatinine, Ser: 0.71 mg/dL (ref 0.61–1.24)
GFR calc Af Amer: >60 mL/min (ref >60)
GFR calc non Af Amer: >60 mL/min (ref >60)
Glucose, Bld: 147 mg/dL — ABNORMAL HIGH (ref 70–99)
Potassium: 3.9 mmol/L (ref 3.5–5.1)
Sodium: 138 mmol/L (ref 135–145)
Total Bilirubin: 1.4 mg/dL — ABNORMAL HIGH (ref 0.3–1.2)
Total Protein: 5.5 g/dL — ABNORMAL LOW (ref 6.5–8.1)

## 2020-01-18 LAB — CBC WITH DIFFERENTIAL/PLATELET
Abs Immature Granulocytes: 0.01 10*3/uL (ref 0.00–0.07)
Basophils Absolute: 0.1 10*3/uL (ref 0.0–0.1)
Basophils Relative: 2 %
Eosinophils Absolute: 0.2 10*3/uL (ref 0.0–0.5)
Eosinophils Relative: 4 %
HCT: 24.4 % — ABNORMAL LOW (ref 39.0–52.0)
Hemoglobin: 8.5 g/dL — ABNORMAL LOW (ref 13.0–17.0)
Immature Granulocytes: 0 %
Lymphocytes Relative: 21 %
Lymphs Abs: 0.7 10*3/uL (ref 0.7–4.0)
MCH: 41.7 pg — ABNORMAL HIGH (ref 26.0–34.0)
MCHC: 34.8 g/dL (ref 30.0–36.0)
MCV: 119.6 fL — ABNORMAL HIGH (ref 80.0–100.0)
Monocytes Absolute: 0.5 10*3/uL (ref 0.1–1.0)
Monocytes Relative: 14 %
Neutro Abs: 2 10*3/uL (ref 1.7–7.7)
Neutrophils Relative %: 59 %
Platelets: 251 10*3/uL (ref 150–400)
RBC: 2.04 MIL/uL — ABNORMAL LOW (ref 4.22–5.81)
RDW: 13.2 % (ref 11.5–15.5)
Smear Review: NORMAL
WBC: 3.4 10*3/uL — ABNORMAL LOW (ref 4.0–10.5)
nRBC: 0 % (ref 0.0–0.2)

## 2020-01-18 LAB — TROPONIN I (HIGH SENSITIVITY): Troponin I (High Sensitivity): 4 ng/L (ref <18)

## 2020-01-18 LAB — BRAIN NATRIURETIC PEPTIDE: B Natriuretic Peptide: 51.7 pg/mL (ref 0.0–100.0)

## 2020-01-18 LAB — LACTIC ACID, PLASMA: Lactic Acid, Venous: 1.2 mmol/L (ref 0.5–1.9)

## 2020-01-18 NOTE — ED Triage Notes (Addendum)
Pt to ED via [facility] from home for chief complaint syncope and hypotension. Per EMS wife found pt unconscious while sitting.  EMS had BP of 106/51 while sitting, decreased to 80/40 upon standing. BP 118/51 upon arrival with eMS.  CBG 112 with EMS EMS found pt to have urinated on himself, per wife that is normal for pt at baseline.  Pt with 18g to R AC by EMS Hx parkinsons

## 2020-01-18 NOTE — ED Provider Notes (Signed)
[facility] Emergency Department Provider Note   ____________________________________________   First MD Initiated Contact with Patient 01/18/20 2224     (approximate)  I have reviewed the triage vital signs and the nursing notes.   HISTORY  Chief Complaint Loss of Consciousness and Hypotension   HPI Casey Tucker is a 79 y.o. male who reports he has a history of syncope in the past.  Today he was sitting down and passed out.  His wife reports it took much longer than usual to wake him up.  When he stood up his blood pressure dropped.  Now he reports he does not have any chest pain shortness of breath bellyache or any other complaints at all.  He did not have any complaints when he was passed passing out either.  Past medical history as below.      Past Medical History:  Diagnosis Date  . Arthritis    left hip   . GERD (gastroesophageal reflux disease) Pre 2002   Mild  . History of kidney stones   . 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 Parkinson disease ([place]) 2004   Slowly progressive  . Ruptured appendix teens    Patient Active Problem List   Diagnosis Date Noted  . Neurogenic urinary incontinence 11/02/2019  . Dementia ([place]) 07/04/2019  . Mood disorder ([place]) 07/04/2019  . Spinal stenosis, lumbar region with neurogenic claudication 04/07/2019  . Irregular heart beat 04/07/2019  . Malnutrition of mild degree ([place]) 04/07/2019  . Urinary frequency 01/21/2018  . Advanced directives, counseling/discussion 04/20/2014  . Routine general medical examination at a health care facility 03/16/2012  . 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 Osteoarthrosis, hip 08/12/2011  . Parkinson disease ([place]) 01/29/2011  . Aortic stenosis 01/01/2009  . GILBERT'S SYNDROME 12/09/2007  . GERD 12/09/2007  . RENAL CALCULUS, HX OF 12/09/2007    Past Surgical History:    Procedure Laterality Date  . APPENDECTOMY    . CATARACT EXTRACTION W/ INTRAOCULAR LENS  IMPLANT, BILATERAL  2017  . DEEP BRAIN STIMULATOR PLACEMENT  8/14   L STN  . DOPPLER ECHOCARDIOGRAPHY  01/10/2009   LV NML Mild LVH EF 60-65% aortic sclerosis w/0 stenosis   . JOINT REPLACEMENT     left hip replacement 08/14/11/[place]   . SUBTHALAMIC STIMULATOR BATTERY REPLACEMENT Left 03/25/2017   Procedure: Deep Brain Stimulator battery replacement;  Surgeon: Erline Levine, MD;  Location: [facility];  Service: Neurosurgery;  Laterality: Left;  left  . TONSILLECTOMY    . TOTAL HIP REVISION  08/27/2011   Procedure: TOTAL HIP REVISION;  Surgeon: Mauri Pole, MD;  [facility];  Service: Orthopedics;  Laterality: Left;    Prior to Admission medications   Medication Sig Start Date End Date Taking? Authorizing Provider  carbidopa-levodopa (SINEMET IR) 25-100 MG tablet 2 at 7:30am, 1 at 10am, 1 at 1pm, 1 at 4pm, 1 at 7pm 08/02/19   Tat, Wells Guiles S, DO  clonazePAM (KLONOPIN) 0.5 MG tablet TAKE 1 TO 2 TABLETS AT BEDTIME AS NEEDED 04/25/19   [name], MD  entacapone (COMTAN) 200 MG tablet Take 1 tablet (200 mg total) by mouth 5 (five) times daily. Take with Levodopa 07/06/19   Tat, Eustace Quail, DO  gabapentin (NEURONTIN) 100 MG capsule Take 1 capsule (100 mg total) by mouth 3 (three) times daily. And 200mg  at bedtime (2 capsules) 01/12/20   [name]  I, MD  polyethylene glycol (MIRALAX / GLYCOLAX) 17 g packet Take 17 g by mouth daily.    [provider]  sennosides-docusate sodium (SENOKOT-S) 8.6-50 MG tablet Take 2 tablets by mouth 2 (two) times daily.    [provider]    Allergies Amoxicillin, Celebrex [celecoxib], Demerol, and Penicillins  Family History  Problem Relation Age of Onset  . Arthritis Mother        knee replacement  . COPD Father        emphysema, smoker  . Heart disease Father        CHF  . Healthy Sister   . Alcohol abuse Paternal Uncle   . Rheum  arthritis Daughter     Social History Social History   Tobacco Use  . Smoking status: Never Smoker  . Smokeless tobacco: Never Used  Vaping Use  . Vaping Use: Never used  Substance Use Topics  . Alcohol use: Yes    Alcohol/week: 7.0 standard drinks    Types: 7 Standard drinks or equivalent per week    Comment: occassionally  . Drug use: No    Review of Systems  Constitutional: No fever/chills Eyes: No visual changes. ENT: No sore throat. Cardiovascular: Denies chest pain. Respiratory: Denies shortness of breath. Gastrointestinal: No abdominal pain.  No nausea, no vomiting.  No diarrhea.  No constipation. Genitourinary: Negative for dysuria. Musculoskeletal: Negative for back pain. Skin: Negative for rash. Neurological: Negative for headaches, focal weakness  ____________________________________________   PHYSICAL EXAM:  VITAL SIGNS: ED Triage Vitals  Enc Vitals Group     BP 01/18/20 2234 (!) 113/54     Pulse Rate 01/18/20 2233 67     Resp 01/18/20 2233 19     Temp 01/18/20 2233 97.7 F (36.5 C)     Temp Source 01/18/20 2233 Oral     SpO2 01/18/20 2233 100 %     Weight 01/18/20 2227 147 lb 11.3 oz (67 kg)     Height 01/18/20 2227 6' (1.829 m)     Head Circumference --      Peak Flow --      Pain Score 01/18/20 2227 0     Pain Loc --      Pain Edu? --      Excl. in [place]? --     Constitutional: Alert and oriented. Well appearing and in no acute distress. Eyes: Conjunctivae are somewhat pale Head: Atraumatic. Nose: No congestion/rhinnorhea. Mouth/Throat: Mucous membranes are moist.  Oropharynx non-erythematous. Neck: No stridor.   Cardiovascular: Normal rate, regular rhythm. Grossly normal heart sounds.  Good peripheral circulation. Respiratory: Normal respiratory effort.  No retractions. Lungs CTAB. Gastrointestinal: Soft and nontender. No distention. No abdominal bruits.  Musculoskeletal: No lower extremity tenderness nor edema.   Neurologic:  Normal  speech and language. No gross focal neurologic deficits are appreciated.  Skin:  Skin is warm, dry and intact. No rash noted.   ____________________________________________   LABS (all labs ordered are listed, but only abnormal results are displayed)  Labs Reviewed  COMPREHENSIVE METABOLIC PANEL - Abnormal; Notable for the following components:      Result Value   Glucose, Bld 147 (*)    Calcium 7.6 (*)    Total Protein 5.5 (*)    Albumin 3.2 (*)    AST 9 (*)    Total Bilirubin 1.4 (*)    Anion gap 4 (*)    All other components within normal limits  CBC WITH DIFFERENTIAL/PLATELET - Abnormal; Notable for  the following components:   WBC 3.4 (*)    RBC 2.04 (*)    Hemoglobin 8.5 (*)    HCT 24.4 (*)    MCV 119.6 (*)    MCH 41.7 (*)    All other components within normal limits  BRAIN NATRIURETIC PEPTIDE  LACTIC ACID, PLASMA  URINALYSIS, COMPLETE (UACMP) WITH MICROSCOPIC  TROPONIN I (HIGH SENSITIVITY)  TROPONIN I (HIGH SENSITIVITY)   ____________________________________________  EKG  EKG read interpreted by me shows a heart rate of about 70 normal axis very very poor baseline but no apparent ST segment elevation or depression.  Computer is reading A. fib this is not true I see P waves. ____________________________________________  RADIOLOGY  ED MD interpretation: CT of the head shows no acute changes chest x-ray shows a questionable opacity left lung base.  Official radiology report(s): CT Head Wo Contrast  Result Date: 01/18/2020 CLINICAL DATA:  Syncope, hypotension, headache EXAM: CT HEAD WITHOUT CONTRAST TECHNIQUE: Contiguous axial images were obtained from the base of the skull through the vertex without intravenous contrast. COMPARISON:  03/06/2015 FINDINGS: Brain: No acute infarct or hemorrhage. Deep brain stimulator lead from a left frontal approach unchanged since prior MRI. Lateral ventricles and midline structures are unremarkable. No acute extra-axial fluid  collections. No mass effect. Vascular: No hyperdense vessel or unexpected calcification. Skull: Postsurgical changes from left frontal burr hole and stimulator lead placement. No acute bony abnormalities. Sinuses/Orbits: No acute finding. Other: None. IMPRESSION: 1. No acute intracranial process. Electronically Signed   By: Randa Ngo M.D.   On: 01/18/2020 22:54   DG Chest Portable 1 View  Result Date: 01/18/2020 CLINICAL DATA:  Syncope EXAM: PORTABLE CHEST 1 VIEW COMPARISON:  April 07, 2019 FINDINGS: A stimulator pack is noted overlying the left upper lung zone. This obscures lung details at this level. Atherosclerotic changes are noted of the thoracic aorta. The heart size is normal. There is a questionable opacity at the left lung base. There are advanced degenerative changes of both glenohumeral joints. IMPRESSION: 1. No definite acute cardiopulmonary process. 2. Questionable opacity at the left lung base may represent atelectasis or infiltrate. A 4-6 week follow-up two-view chest x-ray is recommended. Electronically Signed   By: Constance Holster M.D.   On: 01/18/2020 22:38    ____________________________________________   PROCEDURES  Procedure(s) performed (including Critical Care):  Procedures   ____________________________________________   INITIAL IMPRESSION / ASSESSMENT AND PLAN / ED COURSE  Patient's wife reports he is not been himself today he just does not feel good.  He is not running a fever.  His white blood count and differential are okay.  His lungs are clear on exam.  I explained the patient and his wife that I think it would be safest if he stayed in the hospital.  He did pass out he was only sitting down his blood pressure dropped and I am not sure why this happened.  He is also anemic I told him all of this.  Also told him about the chest x-ray.  Patient is adamant he wants to go home.  He understands the risks.  I will let him go home.  His wife will keep an eye  on him.  They can always return via EMS if need be.              ____________________________________________   FINAL CLINICAL IMPRESSION(S) / ED DIAGNOSES  Final diagnoses:  Syncope and collapse     ED Discharge Orders    None  Note:  This document was prepared using Dragon voice recognition software and may include unintentional dictation errors.    Nena Polio, MD 01/19/20 [phone]

## 2020-01-19 NOTE — Discharge Instructions (Addendum)
Please follow-up with your regular doctor.  Please have him keep benign your chest x-ray remember there was a haziness at the base there.  Please return for fever weakness or any other problems.

## 2020-01-24 ENCOUNTER — Other Ambulatory Visit: Payer: Self-pay | Admitting: Neurology

## 2020-01-24 ENCOUNTER — Telehealth: Payer: Self-pay

## 2020-01-24 NOTE — Telephone Encounter (Signed)
Telephone call to schedule palliative care visit.  Patient in agreement with palliative care home visit 01-25-20 at 2:00 PM.

## 2020-01-24 NOTE — Telephone Encounter (Signed)
Rx(s) sent to pharmacy electronically.  Note to pharmacy to have patient contact office for appt for additional refills and 90 day supply.

## 2020-01-25 ENCOUNTER — Other Ambulatory Visit: Payer: Medicare Other

## 2020-01-25 ENCOUNTER — Other Ambulatory Visit: Payer: Self-pay

## 2020-01-25 DIAGNOSIS — Z515 Encounter for palliative care: Secondary | ICD-10-CM

## 2020-01-25 NOTE — Progress Notes (Signed)
COMMUNITY PALLIATIVE CARE SW NOTE  PATIENT NAME: Casey Tucker DOB: September 03, 1940 MRN: [id number]  PRIMARY CARE PROVIDER: [name], MD  RESPONSIBLE PARTY:  Acct ID - Guarantor Home Phone Work Phone Relationship Acct Type  [id number] [name]  Self P/F     [address], [place], [place] [address]     PLAN OF CARE and INTERVENTIONS:             1. GOALS OF CARE/ ADVANCE CARE PLANNING: Patient is Full Code. HCPOA is patients wife [name] and daughters [name]. Living Will is complete. Goal is to remain at home and be as independent as possible.  2. SOCIAL/EMOTIONAL/SPIRITUAL ASSESSMENT/ INTERVENTIONS: SW and RN met with patient and [name] (patient's wife) in the home for scheduled visit. Patient had a passing out episode last week that led to an ED visit to orthostatic hypertension. Discussed patient's daily routine. Patient reports on going leg pain, does use Tylenol and gabapentin, patient was not in pain during visit. Patient said he is sleeping well. Patient noted episodes of incontinence during the night and has briefs and BSC. Patient said his appetite is good and he eats well. RN reviewed and discussed medications, and provided education on disease progression. SW provided emotional support, discussed care options and used active and reflective listening. No concerns voices. Will Palliative care will continue to follow. 3. PATIENT/CAREGIVER EDUCATION/ COPING:  Patient was alert, engaged and more talkative during this visit. Patient has flat affect. Patient was calm, pleasant. Patient denies any coping concerns. Patient seems content with current situation and hopes to maintain. Family continues to be supportive. 4. PERSONAL EMERGENCY PLAN:  Family will call 9-1-1 for emergencies. 5. COMMUNITY RESOURCES COORDINATION/ HEALTH CARE NAVIGATION:  None. Wife shared that she someone that comes to the home once a week to assist with things around the house and running errands.  Both patient and wife declined the need for in home aid assistance at this time. 6. FINANCIAL/LEGAL CONCERNS/INTERVENTIONS:  None.     SOCIAL HX:  Social History   Tobacco Use  . Smoking status: Never Smoker  . Smokeless tobacco: Never Used  Substance Use Topics  . Alcohol use: Yes    Alcohol/week: 7.0 standard drinks    Types: 7 Standard drinks or equivalent per week    Comment: occassionally    CODE STATUS: FULL CODE ADVANCED DIRECTIVES: Y MOST FORM COMPLETE: N HOSPICE EDUCATION PROVIDED: Yes, education provided during this visit.   PPS: Patient is ambulatory with RW, but is weak and is a fall risk as he has had falls prior. Patient has incontinence episodes but is able to transfer to toilet or BSC. Wife assist with bathing ADLS and meal prep.   Time Spent: Visit last 64 mins       [place], [place]

## 2020-01-25 NOTE — Progress Notes (Signed)
PATIENT NAME: Casey Tucker DOB: 16-May-1941 MRN: [id number]  PRIMARY CARE PROVIDER: [name], MD  RESPONSIBLE PARTY:  Acct ID - Guarantor Home Phone Work Phone Relationship Acct Type  [id number] [name]  Self P/F     [place], [place], [place] [address]    PLAN OF CARE and INTERVENTIONS:               1.  GOALS OF CARE/ ADVANCE CARE PLANNING: Patient wants to remain at home wife wife for as long as care can be managed.                  2.  PATIENT/CAREGIVER EDUCATION: Education on fall precautions, education on disease progression, reviewed meds, support                3.  DISEASE STATUS: SW [place] and RN made schedulde palliative care home visit. Palliative care team met with patient and his wife Casey Tucker. Patient sitting in den in [place] chair with feet elevated. Patient reports he was having pain in his legs earlier.  Patient states he took 2 ES Tylenol and pain has subsided. Wife reports patient complains frequently with pain in his legs and pain will come on patient suddenly. Patient reports he does get relief with taking extra strength Tylenol. Patient had a ED 01-18-20 for syncope and collapse. MD felt patient suffered a episode of hypotension. MD wanted to admit patient to the hospital however patient was adamant to return home. Patient continues to ambulate with his rolling walker. Wife reports patient continues to have frequent falls. Patient wight at Dr Alla German office was 149 pounds. Patient reports his appetite comes and goes. Wife feels patient's appetite is fair but states patient does not eat as much as he used to. Patients vital signs are stable. Patient breath sounds are clear. Patient denies having any cough or shortness of breath. Patient has no edema in his lower extremities. Patient has bruising on mid right arm. Patient states he has been sleeping well but complains of urinary incontinence during the night. Nurse reviewed patient's medications with  patient. Patient states he is no longer taking clonazepam. Patient continues to take Carbidopa-levodopa 5 times per day.  Patients wife states she does not feel medication is working as well. Patient has been taking Gabapentin for pain in his legs in addition to Tylenol which he takes as needed. Patient states he wants to talk to his neurologist Dr Tat about any other medications available to him to control Parkinsons symptoms.  Patient and wife remain in agreement with palliative care services. Patient remains a Full code. Patient and wife encouraged to contact palliative care with questions or concerns.     HISTORY OF PRESENT ILLNESS: Patient is a 79 year old male who resides in home with his wife Casey Tucker.  Patient is followed by palliative care.  Patient is seen monthly and PRN      CODE STATUS: Full Code  ADVANCED DIRECTIVES: Y MOST FORM: No PPS: 50%   PHYSICAL EXAM:   VITALS: Today's Vitals   01/25/20 1425  BP: (!) 100/50  Pulse: (!) 59  Resp: 16  Temp: 98.1 F (36.7 C)  TempSrc: Temporal  SpO2: 99%  Weight: 146 lb (66.2 kg)  PainSc: 0-No pain    LUNGS: clear to auscultation  CARDIAC: Cor RRR  EXTREMITIES: none edema SKIN: Skin color, texture, turgor normal. No rashes or lesions  NEURO: positive for gait problems and memory problems  Keely Drennan, RN 

## 2020-01-26 ENCOUNTER — Other Ambulatory Visit: Payer: Medicare Other

## 2020-01-29 ENCOUNTER — Telehealth: Payer: Self-pay | Admitting: Neurology

## 2020-01-29 NOTE — Telephone Encounter (Signed)
Spoke with pt who says when he checked his DBS to make sure it was working he discovered it was turned off. He is unsure how to get it back on. Will call him back with contact number of rep who can assist him.

## 2020-01-29 NOTE — Procedures (Signed)
DBS Programming was performed.    Manufacturer of DBS device: [employer]onic  Total time spent programming was 10 minutes.  Device was OFF.  Device turned back on.  Pt limits turned OFF.  Soft start was confirmed to be on.  Impedences were checked and were within normal limits.  Battery was checked and was determined to be functioning normally and not near the end of life.  Final settings were as follows:   Active Contact Amplitude (mA) PW (ms) Frequency (hz) Side Effects Battery  Left Brain        01/29/20        Group A (active) 2-C+ 3.7 60 135  2.88  Group B (not active) 1-C+ 3.2 90 140            Right Brain        n/a

## 2020-01-29 NOTE — Telephone Encounter (Signed)
Attempted to instruct patient how to turn on device. No luck. Per Dr Tat patient can come in so we can turn on his DBS.   Appointment scheduled.  Patients wife agreeable and states they will come to the office tomorrow.

## 2020-01-29 NOTE — Telephone Encounter (Signed)
Patient is having issues with DBS, requesting a call back.

## 2020-01-29 NOTE — Telephone Encounter (Signed)
Tell him to turn on the remote with the small white button in the bottom of the remote.  Once on, push the orange checkmark button and put the remote over the top of the battery.  It should say its "off" if it is.  Now he can push the larger white button (next to the orange button) and put that over the device in the chest and it should turn on the battery.  Tell him to not mess with the device any longer.

## 2020-01-30 ENCOUNTER — Ambulatory Visit (INDEPENDENT_AMBULATORY_CARE_PROVIDER_SITE_OTHER): Payer: Medicare Other | Admitting: Neurology

## 2020-01-30 ENCOUNTER — Encounter: Payer: Self-pay | Admitting: Neurology

## 2020-01-30 ENCOUNTER — Other Ambulatory Visit: Payer: Self-pay

## 2020-01-30 VITALS — BP 110/60 | HR 80 | Ht 72.0 in | Wt 146.0 lb

## 2020-01-30 DIAGNOSIS — R55 Syncope and collapse: Secondary | ICD-10-CM | POA: Diagnosis not present

## 2020-01-30 DIAGNOSIS — G20 Parkinson's disease: Secondary | ICD-10-CM

## 2020-01-30 DIAGNOSIS — G4752 REM sleep behavior disorder: Secondary | ICD-10-CM

## 2020-01-30 NOTE — Progress Notes (Signed)
Assessment/Plan:   1.  Parkinsons Disease  -Status post left STN DBS at [facility] in August, 2014.  Battery last changed in September, 2018.  Told the patient not to mess with his DBS any longer, as he accidentally turned it off.  Told him he does not need to check it and to just let me do that.  The only time he needs to use his remote is if he is at an office where he needs an EKG.            -Continue carbidopa/levodopa 25/100, 2 tablets at 7:30 AM/1 tablet at 10 AM/1 tablet at 1 PM/1 tablet at 4 PM/1 tablet at 7 PM.  Can take an extra if needed but not daily.             -Stop entacapone due to increased risk of confusion and not sure it is helping that much 2.  PDD             -Patient no longer driving             `-Meds should be directly monitored.  -Discussed living situation and concern of patient's family.  I think this concern is very valid.  I agree with family that living situation like [place] is ideal with step up care.  Agree with family that competancy eval could be helpful for them in family being able to help with finances.  Patient's neighbor was with him today. 3.  Constipation             -discussed nature and pathophysiology and association with PD             -discussed importance of hydration.  Pt is to increase water intake             -pt is given a copy of the rancho recipe             -recommended daily colace             -recommended miralax prn 4.  RBD             -PDMP has been reviewed and it appears that the patient is off clonazepam. 5.  Syncope  -Needs to follow-up with primary care.  Patient refused admission to the hospital when it happened on July 1.  While this could be related to orthostasis, would be unusual for this to happen while sitting down.  His hemoglobin was only 8.5 in the emergency room (it was only 9.5 in September).  However, his white count also was significantly lower compared to September (9.4 in September and 3.4 in July).  I looked  and his next pcp appt is in Jan.  Will contact pcp and see if we can get earlier appt  Subjective:   Casey Tucker was seen today in follow up for Parkinsons disease.  My previous records were reviewed prior to todays visit as well as outside records available to me.  Patient was worked in today as he called and stated that his DBS got turned off and he could not figure out how to get it turned back on.  We attempted to help him over the telephone as well as help his wife, but they could not figure it out so we brought him in today.  He also had not had a follow-up appointment scheduled for some reason so we brought him in for that.  I have had correspondences with his daughters who  have sent email correspondence of since last visit.  Daughters have been concerned that patient and wife have refused caregivers/home health in the home despite the fact that family thinks that they need help.  Daughter states that family calls them daily with financial and health questions, but they cannot help them the way that they would like.  They would like the patient/wife to go to [facility], but are meeting resistance from patient and wife.  I did recommend competency examination, but discussed that this would have to be agreeable, and we could get that done likely within their home.  Patient was in the emergency room on July 1 after a syncopal episode.  Patient stated that he was sitting down when it happened.  He also states that his blood pressure dropped.  Patient declined admission.  Patient was anemic with a hemoglobin of 8.5.  His white count was 3.4.  He did not follow-up with his primary care physician following this.  Current prescribed movement disorder medications: Carbidopa/levodopa 25/100, 2 tablets at 7:30 AM/1 tablet at 10 AM/1 tablet at 1 PM/1 tablet at 4 PM/1 tablet at 7 PM (states that he often takes 7 per day) Entacapone 200 mg, 1 tablet with first 3 dosages of levodopa (decreased last  visit)  ALLERGIES:   Allergies  Allergen Reactions  . Amoxicillin   . Celebrex [Celecoxib]   . Demerol Other (See Comments)    Hallucinations   . Penicillins     REACTION: RASH    CURRENT MEDICATIONS:  Outpatient Encounter Medications as of 01/30/2020  Medication Sig  . carbidopa-levodopa (SINEMET IR) 25-100 MG tablet TAKE TWO TABLETS AT 7:30AM, ONE AT 10AM,ONE AT 1PM, ONE AT 4PM AND 1 AT 7PM.  . clonazePAM (KLONOPIN) 0.5 MG tablet TAKE 1 TO 2 TABLETS AT BEDTIME AS NEEDED  . entacapone (COMTAN) 200 MG tablet Take 1 tablet (200 mg total) by mouth 5 (five) times daily. Take with Levodopa  . gabapentin (NEURONTIN) 100 MG capsule Take 1 capsule (100 mg total) by mouth 3 (three) times daily. And 200mg  at bedtime (2 capsules)  . polyethylene glycol (MIRALAX / GLYCOLAX) 17 g packet Take 17 g by mouth daily.  . sennosides-docusate sodium (SENOKOT-S) 8.6-50 MG tablet Take 2 tablets by mouth 2 (two) times daily.   No facility-administered encounter medications on file as of 01/30/2020.    Objective:   PHYSICAL EXAMINATION:    VITALS:  There were no vitals filed for this visit.  GEN:  The patient appears stated age and is in NAD. HEENT:  Normocephalic, atraumatic.  The mucous membranes are moist. The superficial temporal arteries are without ropiness or tenderness. CV:  RRR Lungs:  CTAB Neck/HEME:  There are no carotid bruits bilaterally.  Neurological examination:  Orientation: The patient is alert and oriented x3. Cranial nerves: There is good facial symmetry with significant facial hypomimia. The speech is fluent and clear. Soft palate rises symmetrically and there is no tongue deviation. Hearing is intact to conversational tone. Sensation: Sensation is intact to light touch throughout Motor: Strength is at least antigravity x4.  Movement examination: Tone: There is normal tone in the UE/LE (even with device off) Abnormal movements: none Coordination:  There is  decremation with  RAM's, esp with finger and toe taps bilaterally Gait and Station: not tested.  Pt in transport chair  I have reviewed and interpreted the following labs independently    Chemistry      Component Value Date/Time   NA 138 01/18/2020  2225   NA 142 08/11/2016 1701   NA 139 08/15/2011 0535   K 3.9 01/18/2020 2225   K 3.8 08/15/2011 0535   CL 105 01/18/2020 2225   CL 101 08/15/2011 0535   CO2 29 01/18/2020 2225   CO2 29 08/15/2011 0535   BUN 22 01/18/2020 2225   BUN 18 08/11/2016 1701   BUN 12 08/15/2011 0535   CREATININE 0.71 01/18/2020 2225   CREATININE 0.73 08/15/2011 0535      Component Value Date/Time   CALCIUM 7.6 (L) 01/18/2020 2225   CALCIUM 7.6 (L) 08/15/2011 0535   ALKPHOS 42 01/18/2020 2225   AST 9 (L) 01/18/2020 2225   ALT <5 01/18/2020 2225   BILITOT 1.4 (H) 01/18/2020 2225   BILITOT 1.2 08/11/2016 1701       Lab Results  Component Value Date   WBC 3.4 (L) 01/18/2020   HGB 8.5 (L) 01/18/2020   HCT 24.4 (L) 01/18/2020   MCV 119.6 (H) 01/18/2020   PLT 251 01/18/2020    Lab Results  Component Value Date   TSH 2.82 03/16/2012     Total time spent on today's visit was 45 minutes, including both face-to-face time and nonface-to-face time.  Time included that spent on review of records (prior notes available to me/labs/imaging if pertinent), discussing treatment and goals, answering patient's questions and coordinating care.  This did not include DBS time today.  Cc:  [name], MD

## 2020-01-30 NOTE — Patient Instructions (Signed)
1.  Stop entacapone 2.  You need to consider moving to [place]  The physicians and staff at [facility] are committed to providing excellent care. You may receive a survey requesting feedback about your experience at our office. We strive to receive "very good" responses to the survey questions. If you feel that your experience would prevent you from giving the office a "very good " response, please contact our office to try to remedy the situation. We may be reached at [phone]. Thank you for taking the time out of your busy day to complete the survey.

## 2020-02-02 ENCOUNTER — Telehealth: Payer: Self-pay

## 2020-02-02 NOTE — Telephone Encounter (Signed)
Patient has been notified directly and voiced understanding.   

## 2020-02-02 NOTE — Telephone Encounter (Signed)
Received voicemail from patient stating if its ok for hi to use vibrating device on legs.

## 2020-02-02 NOTE — Telephone Encounter (Signed)
yes

## 2020-02-02 NOTE — Telephone Encounter (Signed)
Patient states he has a vibrating device, tens unit, for his legs and wanted to know if its safe to use with his DBS.

## 2020-02-07 ENCOUNTER — Emergency Department: Payer: Medicare Other

## 2020-02-07 ENCOUNTER — Encounter: Payer: Self-pay | Admitting: Medical Oncology

## 2020-02-07 ENCOUNTER — Observation Stay
Admission: EM | Admit: 2020-02-07 | Discharge: 2020-02-08 | Disposition: A | Discharge status: Home / Self Care | Payer: Medicare Other | Attending: Internal Medicine | Admitting: Internal Medicine

## 2020-02-07 ENCOUNTER — Other Ambulatory Visit: Payer: Self-pay

## 2020-02-07 ENCOUNTER — Observation Stay
Admit: 2020-02-07 | Discharge: 2020-02-07 | Disposition: A | Discharge status: Home / Self Care | Payer: Medicare Other | Attending: Internal Medicine | Admitting: Internal Medicine

## 2020-02-07 DIAGNOSIS — Z96642 Presence of left artificial hip joint: Secondary | ICD-10-CM | POA: Diagnosis not present

## 2020-02-07 DIAGNOSIS — D72819 Decreased white blood cell count, unspecified: Secondary | ICD-10-CM | POA: Diagnosis present

## 2020-02-07 DIAGNOSIS — F0390 Unspecified dementia without behavioral disturbance: Secondary | ICD-10-CM | POA: Insufficient documentation

## 2020-02-07 DIAGNOSIS — I350 Nonrheumatic aortic (valve) stenosis: Secondary | ICD-10-CM | POA: Diagnosis not present

## 2020-02-07 DIAGNOSIS — G20 Parkinson's disease: Secondary | ICD-10-CM | POA: Insufficient documentation

## 2020-02-07 DIAGNOSIS — I959 Hypotension, unspecified: Secondary | ICD-10-CM | POA: Diagnosis not present

## 2020-02-07 DIAGNOSIS — R4182 Altered mental status, unspecified: Secondary | ICD-10-CM | POA: Diagnosis not present

## 2020-02-07 DIAGNOSIS — I951 Orthostatic hypotension: Secondary | ICD-10-CM | POA: Diagnosis not present

## 2020-02-07 DIAGNOSIS — R55 Syncope and collapse: Principal | ICD-10-CM | POA: Insufficient documentation

## 2020-02-07 DIAGNOSIS — R0902 Hypoxemia: Secondary | ICD-10-CM | POA: Diagnosis not present

## 2020-02-07 DIAGNOSIS — Z20822 Contact with and (suspected) exposure to covid-19: Secondary | ICD-10-CM | POA: Insufficient documentation

## 2020-02-07 DIAGNOSIS — K219 Gastro-esophageal reflux disease without esophagitis: Secondary | ICD-10-CM | POA: Diagnosis not present

## 2020-02-07 DIAGNOSIS — R4020 Unspecified coma: Secondary | ICD-10-CM | POA: Diagnosis not present

## 2020-02-07 DIAGNOSIS — D649 Anemia, unspecified: Secondary | ICD-10-CM | POA: Diagnosis not present

## 2020-02-07 DIAGNOSIS — R7989 Other specified abnormal findings of blood chemistry: Secondary | ICD-10-CM | POA: Diagnosis not present

## 2020-02-07 LAB — BASIC METABOLIC PANEL
Anion gap: 8 (ref 5–15)
BUN: 21 mg/dL (ref 8–23)
CO2: 25 mmol/L (ref 22–32)
Calcium: 7.6 mg/dL — ABNORMAL LOW (ref 8.9–10.3)
Chloride: 105 mmol/L (ref 98–111)
Creatinine, Ser: 0.65 mg/dL (ref 0.61–1.24)
GFR calc Af Amer: >60 mL/min (ref >60)
GFR calc non Af Amer: >60 mL/min (ref >60)
Glucose, Bld: 109 mg/dL — ABNORMAL HIGH (ref 70–99)
Potassium: 3.7 mmol/L (ref 3.5–5.1)
Sodium: 138 mmol/L (ref 135–145)

## 2020-02-07 LAB — CBC WITH DIFFERENTIAL/PLATELET
Abs Immature Granulocytes: 0.01 10*3/uL (ref 0.00–0.07)
Basophils Absolute: 0.1 10*3/uL (ref 0.0–0.1)
Basophils Relative: 2 %
Eosinophils Absolute: 0.2 10*3/uL (ref 0.0–0.5)
Eosinophils Relative: 8 %
HCT: 21.9 % — ABNORMAL LOW (ref 39.0–52.0)
Hemoglobin: 7.6 g/dL — ABNORMAL LOW (ref 13.0–17.0)
Immature Granulocytes: 1 %
Lymphocytes Relative: 30 %
Lymphs Abs: 0.7 10*3/uL (ref 0.7–4.0)
MCH: 42 pg — ABNORMAL HIGH (ref 26.0–34.0)
MCHC: 34.7 g/dL (ref 30.0–36.0)
MCV: 121 fL — ABNORMAL HIGH (ref 80.0–100.0)
Monocytes Absolute: 0.4 10*3/uL (ref 0.1–1.0)
Monocytes Relative: 17 %
Neutro Abs: 0.9 10*3/uL — ABNORMAL LOW (ref 1.7–7.7)
Neutrophils Relative %: 42 %
Platelets: 218 10*3/uL (ref 150–400)
RBC: 1.81 MIL/uL — ABNORMAL LOW (ref 4.22–5.81)
RDW: 13.7 % (ref 11.5–15.5)
WBC: 2.2 10*3/uL — ABNORMAL LOW (ref 4.0–10.5)
nRBC: 0 % (ref 0.0–0.2)

## 2020-02-07 LAB — TROPONIN I (HIGH SENSITIVITY)
Troponin I (High Sensitivity): 4 ng/L (ref <18)
Troponin I (High Sensitivity): 4 ng/L (ref <18)

## 2020-02-07 LAB — IRON AND TIBC
Iron: 179 ug/dL (ref 45–182)
Saturation Ratios: 82 % — ABNORMAL HIGH (ref 17.9–39.5)
TIBC: 218 ug/dL — ABNORMAL LOW (ref 250–450)
UIBC: 39 ug/dL

## 2020-02-07 LAB — TYPE AND SCREEN
ABO/RH(D): A NEG
Antibody Screen: NEGATIVE

## 2020-02-07 LAB — SARS CORONAVIRUS 2 (TAT 6-24 HRS): SARS Coronavirus 2: NEGATIVE

## 2020-02-07 MED ORDER — ONDANSETRON HCL 4 MG PO TABS: 4.0000 mg | ORAL_TABLET | Freq: Four times a day (QID) | ORAL | Status: DC | PRN

## 2020-02-07 MED ORDER — POLYETHYLENE GLYCOL 3350 17 G PO PACK: 17.0000 g | PACK | Freq: Every day | ORAL | Status: DC | PRN

## 2020-02-07 MED ORDER — SODIUM CHLORIDE 0.9% FLUSH
3.0000 mL | Freq: Two times a day (BID) | INTRAVENOUS | Status: DC
  Administered 2020-02-07 (×2): 3 mL via INTRAVENOUS

## 2020-02-07 MED ORDER — CARBIDOPA-LEVODOPA 25-100 MG PO TABS
1.0000 | ORAL_TABLET | Freq: Four times a day (QID) | ORAL | Status: DC
  Administered 2020-02-07 (×2): 1 via ORAL
  Filled 2020-02-07 (×6): qty 1

## 2020-02-07 MED ORDER — ENOXAPARIN SODIUM 40 MG/0.4ML ~~LOC~~ SOLN: 40.0000 mg | SUBCUTANEOUS | Status: DC

## 2020-02-07 MED ORDER — ONDANSETRON HCL 4 MG/2ML IJ SOLN: 4.0000 mg | Freq: Four times a day (QID) | INTRAMUSCULAR | Status: DC | PRN

## 2020-02-07 MED ORDER — LACTATED RINGERS IV BOLUS
1000.0000 mL | Freq: Once | INTRAVENOUS | Status: AC
  Administered 2020-02-07: 1000 mL via INTRAVENOUS

## 2020-02-07 MED ORDER — MAGNESIUM HYDROXIDE 400 MG/5ML PO SUSP: 5.0000 mL | Freq: Every day | ORAL | Status: DC | PRN

## 2020-02-07 MED ORDER — GABAPENTIN 100 MG PO CAPS
100.0000 mg | ORAL_CAPSULE | Freq: Three times a day (TID) | ORAL | Status: DC
  Administered 2020-02-07 – 2020-02-08 (×4): 100 mg via ORAL
  Filled 2020-02-07 (×6): qty 1

## 2020-02-07 MED ORDER — ACETAMINOPHEN 650 MG RE SUPP: 650.0000 mg | Freq: Four times a day (QID) | RECTAL | Status: DC | PRN

## 2020-02-07 MED ORDER — ACETAMINOPHEN 325 MG PO TABS
650.0000 mg | ORAL_TABLET | Freq: Four times a day (QID) | ORAL | Status: DC | PRN
  Administered 2020-02-08: 650 mg via ORAL
  Filled 2020-02-07: qty 2

## 2020-02-07 NOTE — ED Notes (Addendum)
Spouse at bedside st, sitting on a chair and passed out. Spouse did not witness episode- st "it took for ever for him to respond".  St similar episodes in the past but it did not take him long to respond and get back to baseline.  Pt denies melena.

## 2020-02-07 NOTE — Consult Note (Signed)
[facility] Consultation Note  Patient ID: Casey Tucker, MRN: [id number], DOB/AGE: September 22, 1940 79 y.o. Admit date: 02/07/2020   Date of Consult: 02/07/2020 Primary Physician: [name], MD Primary Cardiologist: None  Chief Complaint:  Chief Complaint  Patient presents with  . Loss of Consciousness   Reason for Consult: Syncope  HPI: 79 y.o. male with known severe Parkinson's and apparent some valvular heart disease in the past who has had a significant amount of Parkinson's requiring a stimulator.  The patient has had more more episodes of dizziness and presyncope as well as syncope.  He had an episode where he sitting in a chair and have the syncope for quite a long time but apparently had no other issues including PND orthopnea's dizziness nausea diaphoresis chest pain shortness of breath or lower extremity edema.  When seen in the emergency room the patient had an EKG showing normal sinus rhythm otherwise normal EKG.  Otherwise he does have anemia with a hemoglobin of 7.6 a troponin of 4 without any significant consequence.  Advanced Parkinson's can cause significant episodes of syncope weakness and hemodynamic compromise not specifically due to a primary cardiac origin.  Currently there is no evidence of anginal symptoms and/or congestive heart failure  Past Medical History:  Diagnosis Date  . Arthritis    left hip   . GERD (gastroesophageal reflux disease) Pre 2002   Mild  . History of kidney stones   . 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 Parkinson disease ([place]) 2004   Slowly progressive  . Ruptured appendix teens      Surgical History:  Past Surgical History:  Procedure Laterality Date  . APPENDECTOMY    . CATARACT EXTRACTION W/ INTRAOCULAR LENS  IMPLANT, BILATERAL  2017  . DEEP BRAIN STIMULATOR PLACEMENT  8/14   L STN  . DOPPLER ECHOCARDIOGRAPHY  01/10/2009   LV NML  Mild LVH EF 60-65% aortic sclerosis w/0 stenosis   . JOINT REPLACEMENT     left hip replacement 08/14/11/[place]   . SUBTHALAMIC STIMULATOR BATTERY REPLACEMENT Left 03/25/2017   Procedure: Deep Brain Stimulator battery replacement;  Surgeon: Erline Levine, MD;  Location: [place];  Service: Neurosurgery;  Laterality: Left;  left  . TONSILLECTOMY    . TOTAL HIP REVISION  08/27/2011   Procedure: TOTAL HIP REVISION;  Surgeon: Mauri Pole, MD;  Location: [facility];  Service: Orthopedics;  Laterality: Left;     Home Meds: Prior to Admission medications   Medication Sig Start Date End Date Taking? Authorizing Provider  carbidopa-levodopa (SINEMET IR) 25-100 MG tablet TAKE TWO TABLETS AT 7:30AM, ONE AT 10AM,ONE AT 1PM, ONE AT 4PM AND 1 AT 7PM. 01/24/20  Yes Tat, Eustace Quail, DO  gabapentin (NEURONTIN) 100 MG capsule Take 1 capsule (100 mg total) by mouth 3 (three) times daily. And 200mg  at bedtime (2 capsules) 01/12/20  Yes [name], MD  magnesium hydroxide (MILK OF MAGNESIA) 400 MG/5ML suspension Take 5 mLs by mouth daily as needed for mild constipation.   Yes [provider]  polyethylene glycol (MIRALAX / GLYCOLAX) 17 g packet Take 17 g by mouth daily as needed.    Yes [provider]  meloxicam (MOBIC) 15 MG tablet Take 15 mg by mouth daily. 02/06/20   [provider]    Inpatient Medications:  . carbidopa-levodopa  1 tablet Oral QID  . gabapentin  100 mg Oral TID  .  sodium chloride flush  3 mL Intravenous Q12H     Allergies:  Allergies  Allergen Reactions  . Amoxicillin   . Celebrex [Celecoxib]   . Demerol Other (See Comments)    Hallucinations   . Penicillins     REACTION: RASH    Social History   Socioeconomic History  . Marital status: Married    Spouse name: Not on file  . Number of children: 2  . Years of education: Not on file  . Highest education level: Doctorate  Occupational History  . Occupation: Optometrist    Comment: medically retired   Tobacco Use  . Smoking status: Never Smoker  . Smokeless tobacco: Never Used  Vaping Use  . Vaping Use: Never used  Substance and Sexual Activity  . Alcohol use: Yes    Alcohol/week: 7.0 standard drinks    Types: 7 Standard drinks or equivalent per week    Comment: occassionally  . Drug use: No  . Sexual activity: Yes  Other Topics Concern  . Not on file  Social History Narrative   Married, lives with wife   2 daughters   Right handed   Has living will   Wife has health care POA---then daughters   Would still accept CPR--but no prolonged artificial means (ventilator or tube feeds)   Social Determinants of Health   Financial Resource Strain:   . Difficulty of Paying Living Expenses:   Food Insecurity:   . Worried About Charity fundraiser in the Last Year:   . Arboriculturist in the Last Year:   Transportation Needs:   . Film/video editor (Medical):   Marland Kitchen Lack of Transportation (Non-Medical):   Physical Activity:   . Days of Exercise per Week:   . Minutes of Exercise per Session:   Stress:   . Feeling of Stress :   Social Connections:   . Frequency of Communication with Friends and Family:   . Frequency of Social Gatherings with Friends and Family:   . Attends Religious Services:   . Active Member of Clubs or Organizations:   . Attends Archivist Meetings:   Marland Kitchen Marital Status:   Intimate Partner Violence:   . Fear of Current or Ex-Partner:   . Emotionally Abused:   Marland Kitchen Physically Abused:   . Sexually Abused:      Family History  Problem Relation Age of Onset  . Arthritis Mother        knee replacement  . COPD Father        emphysema, smoker  . Heart disease Father        CHF  . Healthy Sister   . Alcohol abuse Paternal Uncle   . Rheum arthritis Daughter      Review of Systems Positive for syncope Negative for: General:  chills, fever, night sweats or weight changes.  Cardiovascular: PND orthopnea positive for syncope dizziness   Dermatological skin lesions rashes Respiratory: Cough congestion Urologic: Frequent urination urination at night and hematuria Abdominal: negative for nausea, vomiting, diarrhea, bright red blood per rectum, melena, or hematemesis Neurologic: negative for visual changes, and/or hearing changes  All other systems reviewed and are otherwise negative except as noted above.  Labs: No results for input(s): CKTOTAL, CKMB, TROPONINI in the last 72 hours. Lab Results  Component Value Date   WBC 2.2 (L) 02/07/2020   HGB 7.6 (L) 02/07/2020   HCT 21.9 (L) 02/07/2020   MCV 121.0 (H) 02/07/2020   PLT 218 02/07/2020  Recent Labs  Lab 02/07/20 1209  NA 138  K 3.7  CL 105  CO2 25  BUN 21  CREATININE 0.65  CALCIUM 7.6*  GLUCOSE 109*   Lab Results  Component Value Date   CHOL 166 03/16/2012   HDL 62.10 03/16/2012   LDLCALC 98 03/16/2012   TRIG 30.0 03/16/2012   No results found for: DDIMER  Radiology/Studies:  CT Head Wo Contrast  Result Date: 02/07/2020 CLINICAL DATA:  Mental status change, unknown cause. Additional history provided: Syncopal episode while sitting in chair. EXAM: CT HEAD WITHOUT CONTRAST TECHNIQUE: Contiguous axial images were obtained from the base of the skull through the vertex without intravenous contrast. COMPARISON:  Head CT 01/18/2020 FINDINGS: Brain: Unchanged position of a left frontal approach deep brain stimulator lead. Cerebral volume is normal for age. There is no acute intracranial hemorrhage. No demarcated cortical infarct. No extra-axial fluid collection. No evidence of intracranial mass. No midline shift. Vascular: No hyperdense vessel. Skull: Normal. Negative for fracture or focal lesion. Sinuses/Orbits: Visualized orbits show no acute finding. No significant paranasal sinus disease or mastoid effusion at the imaged levels. IMPRESSION: Stable non-contrast CT appearance of the brain as compared to 01/18/2020. No evidence of acute intracranial abnormality.  Electronically Signed   By: Kellie Simmering DO   On: 02/07/2020 13:22   CT Head Wo Contrast  Result Date: 01/18/2020 CLINICAL DATA:  Syncope, hypotension, headache EXAM: CT HEAD WITHOUT CONTRAST TECHNIQUE: Contiguous axial images were obtained from the base of the skull through the vertex without intravenous contrast. COMPARISON:  03/06/2015 FINDINGS: Brain: No acute infarct or hemorrhage. Deep brain stimulator lead from a left frontal approach unchanged since prior MRI. Lateral ventricles and midline structures are unremarkable. No acute extra-axial fluid collections. No mass effect. Vascular: No hyperdense vessel or unexpected calcification. Skull: Postsurgical changes from left frontal burr hole and stimulator lead placement. No acute bony abnormalities. Sinuses/Orbits: No acute finding. Other: None. IMPRESSION: 1. No acute intracranial process. Electronically Signed   By: Randa Ngo M.D.   On: 01/18/2020 22:54   DG Chest Portable 1 View  Result Date: 01/18/2020 CLINICAL DATA:  Syncope EXAM: PORTABLE CHEST 1 VIEW COMPARISON:  April 07, 2019 FINDINGS: A stimulator pack is noted overlying the left upper lung zone. This obscures lung details at this level. Atherosclerotic changes are noted of the thoracic aorta. The heart size is normal. There is a questionable opacity at the left lung base. There are advanced degenerative changes of both glenohumeral joints. IMPRESSION: 1. No definite acute cardiopulmonary process. 2. Questionable opacity at the left lung base may represent atelectasis or infiltrate. A 4-6 week follow-up two-view chest x-ray is recommended. Electronically Signed   By: Constance Holster M.D.   On: 01/18/2020 22:38    EKG: Normal sinus rhythm otherwise normal EKG  Weights: Filed Weights   02/07/20 1209  Weight: 66 kg     Physical Exam: Blood pressure (!) 137/55, pulse 86, temperature 97.9 F (36.6 C), temperature source Axillary, resp. rate 19, height 6' (1.829 m), weight 66  kg, SpO2 99 %. Body mass index is 19.73 kg/m. General: Well developed, well nourished, in no acute distress. Head eyes ears nose throat: Normocephalic, atraumatic, sclera non-icteric, no xanthomas, nares are without discharge. No apparent thyromegaly and/or mass  Lungs: Normal respiratory effort.  no wheezes, no rales, no rhonchi.  Heart: RRR with normal S1 S2.  2+ right upper sternal border murmur gallop, no rub, PMI is normal size and placement, carotid upstroke normal  without bruit, jugular venous pressure is normal Abdomen: Soft, non-tender, non-distended with normoactive bowel sounds. No hepatomegaly. No rebound/guarding. No obvious abdominal masses. Abdominal aorta is normal size without bruit Extremities: No edema. no cyanosis, no clubbing, no ulcers  Peripheral : 2+ bilateral upper extremity pulses, 2+ bilateral femoral pulses, 2+ bilateral dorsal pedal pulse Neuro: Alert and oriented. No facial asymmetry. No focal deficit. Moves all extremities spontaneously. Musculoskeletal: Normal muscle tone without kyphosis Psych:  Responds to questions appropriately with a normal affect.    Assessment: 79 year old male with significant advanced Parkinson's with recurrent episodes of syncope most consistent with labile blood pressure from Parkinson's rather than rhythm disturbances and/or primary cardiac condition without evidence of myocardial infarction or congestive heart failure  Plan: 1.  Continue supportive care of Parkinson's 2.  Echocardiogram for LV systolic dysfunction valvular heart disease as might be contributing to above 3.  No additional medication management due to concerns of contributing to worsening hypotension and/or syncope 4.  Consider consultation with neurology for further confirmation of above 5.  Continue telemetry to assess for rhythm disturbances possibly contributing to above  Signed, Corey Skains M.D. [facility] 02/07/2020, 5:49 PM

## 2020-02-07 NOTE — ED Notes (Signed)
Pt resting quietly.  No distress noted.  

## 2020-02-07 NOTE — ED Triage Notes (Signed)
Pt from home via ems with reports of having syncopal episode while sitting in chair. Denies injury/no fall. When ems arrived pts SBP was 90 lying and 70 when standing. Pt was given 1L NS pta. CBG 150. Denies pain

## 2020-02-07 NOTE — ED Provider Notes (Signed)
[facility] Emergency Department Provider Note   ____________________________________________   None    (approximate)  I have reviewed the triage vital signs and the nursing notes.   HISTORY  Chief Complaint Loss of Consciousness    HPI Casey Tucker is a 79 y.o. male with past medical history of Parkinson disease status post deep brain stimulator, GERD, and aortic stenosis who presents to the ED following syncopal episode.  History is somewhat limited by patient's confusion, wife states that he has had a gradual decline in his cognitive capacity over the past year, but has not officially been diagnosed with dementia.  Wife states that she came into the house from outside and found the patient passed out in his chair while watching TV.  He was initially quite difficult to arouse, but eventually woke up after a couple of minutes.  He denies any associated chest pain or shortness of breath, states he has been feeling well recently with no fevers, cough, dysuria, or hematuria.  Wife reports he has had similar episodes in the past, but this is now his second one in the past couple of weeks with the recent episodes lasting much longer than they have in the past.        Past Medical History:  Diagnosis Date  . Arthritis    left hip   . GERD (gastroesophageal reflux disease) Pre 2002   Mild  . History of kidney stones   . 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 Parkinson disease ([place]) 2004   Slowly progressive  . Ruptured appendix teens    Patient Active Problem List   Diagnosis Date Noted  . Neurogenic urinary incontinence 11/02/2019  . Dementia ([place]) 07/04/2019  . Mood disorder ([place]) 07/04/2019  . Spinal stenosis, lumbar region with neurogenic claudication 04/07/2019  . Irregular heart beat 04/07/2019  . Malnutrition of mild degree ([place]) 04/07/2019  . Urinary frequency  01/21/2018  . Advanced directives, counseling/discussion 04/20/2014  . Routine general medical examination at a health care facility 03/16/2012  . 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 Osteoarthrosis, hip 08/12/2011  . Parkinson disease ([place]) 01/29/2011  . Aortic stenosis 01/01/2009  . GILBERT'S SYNDROME 12/09/2007  . GERD 12/09/2007  . RENAL CALCULUS, HX OF 12/09/2007    Past Surgical History:  Procedure Laterality Date  . APPENDECTOMY    . CATARACT EXTRACTION W/ INTRAOCULAR LENS  IMPLANT, BILATERAL  2017  . DEEP BRAIN STIMULATOR PLACEMENT  8/14   L STN  . DOPPLER ECHOCARDIOGRAPHY  01/10/2009   LV NML Mild LVH EF 60-65% aortic sclerosis w/0 stenosis   . JOINT REPLACEMENT     left hip replacement 08/14/11/[place]   . SUBTHALAMIC STIMULATOR BATTERY REPLACEMENT Left 03/25/2017   Procedure: Deep Brain Stimulator battery replacement;  Surgeon: Erline Levine, MD;  Location: [facility];  Service: Neurosurgery;  Laterality: Left;  left  . TONSILLECTOMY    . TOTAL HIP REVISION  08/27/2011   Procedure: TOTAL HIP REVISION;  Surgeon: Mauri Pole, MD;  Location: [facility];  Service: Orthopedics;  Laterality: Left;    Prior to Admission medications   Medication Sig Start Date End Date Taking? Authorizing Provider  carbidopa-levodopa (SINEMET IR) 25-100 MG tablet TAKE TWO TABLETS AT 7:30AM, ONE AT 10AM,ONE AT 1PM, ONE AT 4PM AND 1 AT 7PM. 01/24/20   Tat, Eustace Quail, DO  gabapentin (NEURONTIN) 100 MG capsule Take 1 capsule (100  mg total) by mouth 3 (three) times daily. And 200mg  at bedtime (2 capsules) 01/12/20   [name], MD  magnesium hydroxide (MILK OF MAGNESIA) 400 MG/5ML suspension Take 5 mLs by mouth daily as needed for mild constipation.    [provider]  polyethylene glycol (MIRALAX / GLYCOLAX) 17 g packet Take 17 g by mouth daily as needed.     [provider]    Allergies Amoxicillin, Celebrex [celecoxib], Demerol, and Penicillins  Family History  Problem  Relation Age of Onset  . Arthritis Mother        knee replacement  . COPD Father        emphysema, smoker  . Heart disease Father        CHF  . Healthy Sister   . Alcohol abuse Paternal Uncle   . Rheum arthritis Daughter     Social History Social History   Tobacco Use  . Smoking status: Never Smoker  . Smokeless tobacco: Never Used  Vaping Use  . Vaping Use: Never used  Substance Use Topics  . Alcohol use: Yes    Alcohol/week: 7.0 standard drinks    Types: 7 Standard drinks or equivalent per week    Comment: occassionally  . Drug use: No    Review of Systems  Constitutional: No fever/chills Eyes: No visual changes. ENT: No sore throat. Cardiovascular: Denies chest pain.  Positive for syncope. Respiratory: Denies shortness of breath. Gastrointestinal: No abdominal pain.  No nausea, no vomiting.  No diarrhea.  No constipation. Genitourinary: Negative for dysuria. Musculoskeletal: Negative for back pain. Skin: Negative for rash. Neurological: Negative for headaches, focal weakness or numbness.  ____________________________________________   PHYSICAL EXAM:  VITAL SIGNS: ED Triage Vitals  Enc Vitals Group     BP 02/07/20 1207 (!) 100/55     Pulse Rate 02/07/20 1207 (!) 53     Resp 02/07/20 1207 16     Temp 02/07/20 1215 97.9 F (36.6 C)     Temp Source 02/07/20 1215 Axillary     SpO2 02/07/20 1207 98 %     Weight 02/07/20 1209 145 lb 8.1 oz (66 kg)     Height 02/07/20 1209 6' (1.829 m)     Head Circumference --      Peak Flow --      Pain Score 02/07/20 1208 0     Pain Loc --      Pain Edu? --      Excl. in [place]? --     Constitutional: Alert and oriented to person and place, but not time. Eyes: Conjunctivae are normal. Head: Atraumatic. Nose: No congestion/rhinnorhea. Mouth/Throat: Mucous membranes are moist. Neck: Normal ROM Cardiovascular: Bradycardic, regular rhythm. Grossly normal heart sounds. Respiratory: Normal respiratory effort.  No  retractions. Lungs CTAB. Gastrointestinal: Soft and nontender. No distention. Genitourinary: deferred Musculoskeletal: No lower extremity tenderness nor edema. Neurologic:  Normal speech and language. No gross focal neurologic deficits are appreciated. Skin:  Skin is warm, dry and intact. No rash noted. Psychiatric: Mood and affect are normal. Speech and behavior are normal.  ____________________________________________   LABS (all labs ordered are listed, but only abnormal results are displayed)  Labs Reviewed  BASIC METABOLIC PANEL - Abnormal; Notable for the following components:      Result Value   Glucose, Bld 109 (*)    Calcium 7.6 (*)    All other components within normal limits  CBC WITH DIFFERENTIAL/PLATELET - Abnormal; Notable for the following components:   WBC  2.2 (*)    RBC 1.81 (*)    Hemoglobin 7.6 (*)    HCT 21.9 (*)    MCV 121.0 (*)    MCH 42.0 (*)    Neutro Abs 0.9 (*)    All other components within normal limits  SARS CORONAVIRUS 2 (TAT 6-24 HRS)  TROPONIN I (HIGH SENSITIVITY)   ____________________________________________  EKG  ED ECG REPORT I, Blake Divine, the attending physician, personally viewed and interpreted this ECG.   Date: 02/07/2020  EKG Time: 13:10  Rate: 55  Rhythm: sinus bradycardia  Axis: Normal  Intervals:none  ST&T Change: None   PROCEDURES  Procedure(s) performed (including Critical Care):  Procedures   ____________________________________________   INITIAL IMPRESSION / ASSESSMENT AND PLAN / ED COURSE       79 year old male with past medical history of Parkinson disease status post deep brain stimulator, GERD, and aortic stenosis who presents to the ED following syncopal episode.  He reportedly has a history of these episodes, but the last couple of times he has been unresponsive for much longer.  He appears well at this time with no complaints, did not have any chest pain or shortness of breath with the episode,  but does have borderline low blood pressure on arrival.  We will hydrate with IV fluids and check orthostatic vital signs.  He has no focal neurologic deficits, but given his mild confusion, we will check CT head.  His confusion could potentially be related to dementia as wife states he has had a gradual decline in the past year.  CT head is negative for acute process, blood pressure remains borderline low with patient currently receiving fluids, however MAP greater than 65.  EKG shows sinus bradycardia without acute ischemic changes.  Patient also noted to have downtrending hemoglobin, denies any recent bleeding and stool guaiac is negative.  His wife was able to convince him to stay for admission for further syncope work-up, case discussed with hospitalist.      ____________________________________________   FINAL CLINICAL IMPRESSION(S) / ED DIAGNOSES  Final diagnoses:  Syncope, unspecified syncope type  Anemia, unspecified type     ED Discharge Orders    None       Note:  This document was prepared using Dragon voice recognition software and may include unintentional dictation errors.   Blake Divine, MD 02/07/20 1348

## 2020-02-07 NOTE — H&P (Signed)
History and Physical    Casey Tucker:[id number] DOB: Jul 17, 1941 DOA: 02/07/2020  PCP: [name], MD   Patient coming from: Home  I have personally briefly reviewed patient's old medical records in [place]  Chief Complaint: " I passed out"  HPI: Casey Tucker is a 79 y.o. male with medical history significant for Parkinson's disease status post deep brain stimulator, history of aortic stenosis and GERD who was brought into the ER by EMS after he had a witnessed syncopal episode while sitting in the chair.  His wife states that she had come into the house and found him passed out in his chair while watching TV.  He was initially difficult to arouse but eventually woke up after a couple of minutes.   Patient had a similar episode about 3 weeks ago and presented to the emergency room but declined hospitalization at that time.  When EMS arrived she was noted to have a systolic blood pressure of 90 mmHg while lying down and 29mmHg while standing. He denies having any chest pain, no shortness of breath, no fever, no cough, no changes in his bowel habits, no urinary symptoms. Labs in the ER reveal a white cell count of 2.7, hemoglobin of 7.6 with a hematocrit of 21.9.  Patient stool is heme-negative.  Baseline hemoglobin is about 9.5g/dl. Patient had a CT scan of the brain without contrast reviewed by me which did not show any acute findings. Twelve-lead EKG reviewed by me shows a junctional rhythm with heart rate of 55 bpm Patient will be referred to observation status   ED Course: Patient is a 79 year old Caucasian male with a history of Parkinson's disease who presents to the emergency room for the second time in 3 weeks for evaluation of a syncopal episode which occurred while he was sitting.  Labs reveal a drop in his hemoglobin from 9.5 >>7.6 and twelve-lead EKG shows bradycardia.  He will be referred to observation status for further evaluation.  Review of Systems: As  per HPI otherwise 10 point review of systems negative.    Past Medical History:  Diagnosis Date  . Arthritis    left hip   . GERD (gastroesophageal reflux disease) Pre 2002   Mild  . History of kidney stones   . 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 Parkinson disease ([place]) 2004   Slowly progressive  . Ruptured appendix teens    Past Surgical History:  Procedure Laterality Date  . APPENDECTOMY    . CATARACT EXTRACTION W/ INTRAOCULAR LENS  IMPLANT, BILATERAL  2017  . DEEP BRAIN STIMULATOR PLACEMENT  8/14   L STN  . DOPPLER ECHOCARDIOGRAPHY  01/10/2009   LV NML Mild LVH EF 60-65% aortic sclerosis w/0 stenosis   . JOINT REPLACEMENT     left hip replacement 08/14/11/[place]   . SUBTHALAMIC STIMULATOR BATTERY REPLACEMENT Left 03/25/2017   Procedure: Deep Brain Stimulator battery replacement;  Surgeon: Erline Levine, MD;  Location: [facility];  Service: Neurosurgery;  Laterality: Left;  left  . TONSILLECTOMY    . TOTAL HIP REVISION  08/27/2011   Procedure: TOTAL HIP REVISION;  Surgeon: Mauri Pole, MD;  Location[facility];  Service: Orthopedics;  Laterality: Left;     reports that he has never smoked. He has never used smokeless tobacco. He reports current alcohol use of about 7.0 standard drinks of alcohol per week. He reports that he does  not use drugs.  Allergies  Allergen Reactions  . Amoxicillin   . Celebrex [Celecoxib]   . Demerol Other (See Comments)    Hallucinations   . Penicillins     REACTION: RASH    Family History  Problem Relation Age of Onset  . Arthritis Mother        knee replacement  . COPD Father        emphysema, smoker  . Heart disease Father        CHF  . Healthy Sister   . Alcohol abuse Paternal Uncle   . Rheum arthritis Daughter      Prior to Admission medications   Medication Sig Start Date End Date Taking? Authorizing Provider  carbidopa-levodopa (SINEMET IR)  25-100 MG tablet TAKE TWO TABLETS AT 7:30AM, ONE AT 10AM,ONE AT 1PM, ONE AT 4PM AND 1 AT 7PM. 01/24/20  Yes Tat, Eustace Quail, DO  gabapentin (NEURONTIN) 100 MG capsule Take 1 capsule (100 mg total) by mouth 3 (three) times daily. And 200mg  at bedtime (2 capsules) 01/12/20  Yes [name], MD  magnesium hydroxide (MILK OF MAGNESIA) 400 MG/5ML suspension Take 5 mLs by mouth daily as needed for mild constipation.    [provider]  meloxicam (MOBIC) 15 MG tablet Take 15 mg by mouth daily. 02/06/20   [provider]  polyethylene glycol (MIRALAX / GLYCOLAX) 17 g packet Take 17 g by mouth daily as needed.     [provider]    Physical Exam: Vitals:   02/07/20 1209 02/07/20 1215 02/07/20 1316 02/07/20 1317  BP:   (!) 101/51 (!) 103/49  Pulse:   (!) 58 (!) 58  Resp:      Temp:  97.9 F (36.6 C)    TempSrc:  Axillary    SpO2:   100% 100%  Weight: 66 kg     Height: 6' (1.829 m)        Vitals:   02/07/20 1209 02/07/20 1215 02/07/20 1316 02/07/20 1317  BP:   (!) 101/51 (!) 103/49  Pulse:   (!) 58 (!) 58  Resp:      Temp:  97.9 F (36.6 C)    TempSrc:  Axillary    SpO2:   100% 100%  Weight: 66 kg     Height: 6' (1.829 m)       Constitutional: NAD, alert and oriented x 2.  Chronically ill-appearing Eyes: PERRL, lids and conjunctivae normal ENMT: Mucous membranes are moist.  Neck: normal, supple, no masses, no thyromegaly Respiratory: clear to auscultation bilaterally, no wheezing, no crackles. Normal respiratory effort. No accessory muscle use.  Cardiovascular: Bradycardia, no murmurs / rubs / gallops. No extremity edema. 2+ pedal pulses. No carotid bruits.  Abdomen: no tenderness, no masses palpated. No hepatosplenomegaly. Bowel sounds positive.  Musculoskeletal: no clubbing / cyanosis. No joint deformity upper and lower extremities.  Skin: no rashes, lesions, ulcers.  Neurologic: No gross focal neurologic deficit. Psychiatric: Flat mood and  affect.   Labs on Admission: I have personally reviewed following labs and imaging studies  CBC: Recent Labs  Lab 02/07/20 1209  WBC 2.2*  NEUTROABS 0.9*  HGB 7.6*  HCT 21.9*  MCV 121.0*  PLT 761   Basic Metabolic Panel: Recent Labs  Lab 02/07/20 1209  NA 138  K 3.7  CL 105  CO2 25  GLUCOSE 109*  BUN 21  CREATININE 0.65  CALCIUM 7.6*   GFR: Estimated Creatinine Clearance: 71 mL/min (by C-G formula based on  SCr of 0.65 mg/dL). Liver Function Tests: No results for input(s): AST, ALT, ALKPHOS, BILITOT, PROT, ALBUMIN in the last 168 hours. No results for input(s): LIPASE, AMYLASE in the last 168 hours. No results for input(s): AMMONIA in the last 168 hours. Coagulation Profile: No results for input(s): INR, PROTIME in the last 168 hours. Cardiac Enzymes: No results for input(s): CKTOTAL, CKMB, CKMBINDEX, TROPONINI in the last 168 hours. BNP (last 3 results) No results for input(s): PROBNP in the last 8760 hours. HbA1C: No results for input(s): HGBA1C in the last 72 hours. CBG: No results for input(s): GLUCAP in the last 168 hours. Lipid Profile: No results for input(s): CHOL, HDL, LDLCALC, TRIG, CHOLHDL, LDLDIRECT in the last 72 hours. Thyroid Function Tests: No results for input(s): TSH, T4TOTAL, FREET4, T3FREE, THYROIDAB in the last 72 hours. Anemia Panel: No results for input(s): VITAMINB12, FOLATE, FERRITIN, TIBC, IRON, RETICCTPCT in the last 72 hours. Urine analysis:    Component Value Date/Time   COLORURINE Amber 08/13/2011 1127   APPEARANCEUR Clear 08/12/2018 1412   LABSPEC 1.027 08/13/2011 1127   PHURINE 5.0 08/13/2011 1127   GLUCOSEU Trace (A) 08/12/2018 1412   GLUCOSEU Negative 08/13/2011 1127   HGBUR Negative 08/13/2011 1127   BILIRUBINUR Negative 08/12/2018 1412   BILIRUBINUR Negative 08/13/2011 1127   KETONESUR Trace 08/13/2011 1127   PROTEINUR 1+ (A) 08/12/2018 1412   PROTEINUR 100 mg/dL 08/13/2011 1127   UROBILINOGEN 0.2 01/21/2018 1049    NITRITE Negative 08/12/2018 1412   NITRITE Negative 08/13/2011 1127   LEUKOCYTESUR Negative 08/12/2018 1412   LEUKOCYTESUR Negative 08/13/2011 1127    Radiological Exams on Admission: CT Head Wo Contrast  Result Date: 02/07/2020 CLINICAL DATA:  Mental status change, unknown cause. Additional history provided: Syncopal episode while sitting in chair. EXAM: CT HEAD WITHOUT CONTRAST TECHNIQUE: Contiguous axial images were obtained from the base of the skull through the vertex without intravenous contrast. COMPARISON:  Head CT 01/18/2020 FINDINGS: Brain: Unchanged position of a left frontal approach deep brain stimulator lead. Cerebral volume is normal for age. There is no acute intracranial hemorrhage. No demarcated cortical infarct. No extra-axial fluid collection. No evidence of intracranial mass. No midline shift. Vascular: No hyperdense vessel. Skull: Normal. Negative for fracture or focal lesion. Sinuses/Orbits: Visualized orbits show no acute finding. No significant paranasal sinus disease or mastoid effusion at the imaged levels. IMPRESSION: Stable non-contrast CT appearance of the brain as compared to 01/18/2020. No evidence of acute intracranial abnormality. Electronically Signed   By: Kellie Simmering DO   On: 02/07/2020 13:22    EKG: Independently reviewed. Junctional rhythm  Assessment/Plan Principal Problem:   Syncope Active Problems:   GERD   Aortic stenosis   Parkinson's disease (HCC)   Anemia   Leukopenia     Syncope  May be related to patient's known history of aortic stenosis as well as autonomic dysfunction from Parkinson's disease We will obtain 2D echocardiogram to assess LVEF as well as aortic valve Will request cardiology consult    Anemia/leukopenia Patient has had a 2 g drop in his H&H from 9.5g/dl >> 7.5g/dl He also has leukopenia Stools are heme negative We will type and crossmatch and will transfuse for hemoglobin less than 7 Obtain iron studies.  Hold  NSAIDs Patient may benefit from endoscopy for further evaluation of his anemia He will also benefit from referral to hematology as an outpatient   Parkinson's disease Continue Sinemet    DVT prophylaxis: SCD Code Status: Full code Family Communication: Greater than  50% of time was spent discussing patient's condition and plan of care with him and his wife at the bedside.  All questions and concerns have been addressed.  They verbalized understanding and agreement with the plan. Disposition Plan: Back to previous home environment Consults called: Cardiology/ Neurology    Collier Bullock MD [facility]     02/07/2020, 3:41 PM

## 2020-02-07 NOTE — ED Notes (Signed)
EDP [place] at bedside performing a rectal exam with this Rn assistance. Pt tolerated well.

## 2020-02-07 NOTE — ED Notes (Signed)
Pt assisted to the bedside commode for a bowel movement. Pt did well with one assist. Pt cleaned and placed back in bed.

## 2020-02-07 NOTE — ED Notes (Signed)
Pt notified of bed assignment and pt verbalized understanding.

## 2020-02-07 NOTE — Plan of Care (Signed)
  Problem: Education: Goal: Knowledge of condition and prescribed therapy will improve Outcome: Progressing   Problem: Cardiac: Goal: Will achieve and/or maintain adequate cardiac output Outcome: Progressing   Problem: Education: Goal: Knowledge of General Education information will improve Description: Including pain rating scale, medication(s)/side effects and non-pharmacologic comfort measures Outcome: Progressing   Problem: Activity: Goal: Risk for activity intolerance will decrease Outcome: Progressing Note: Pt call light in reach and given a fall risk bracelet. Educated on calling not falling.   Problem: Safety: Goal: Ability to remain free from injury will improve Outcome: Progressing

## 2020-02-07 NOTE — ED Notes (Signed)
Cardiologist at bedside.  

## 2020-02-07 NOTE — ED Notes (Signed)
Pt to CT

## 2020-02-08 ENCOUNTER — Telehealth: Payer: Self-pay

## 2020-02-08 ENCOUNTER — Telehealth: Payer: Self-pay | Admitting: Internal Medicine

## 2020-02-08 DIAGNOSIS — D6489 Other specified anemias: Secondary | ICD-10-CM

## 2020-02-08 DIAGNOSIS — Z515 Encounter for palliative care: Secondary | ICD-10-CM

## 2020-02-08 DIAGNOSIS — Z7189 Other specified counseling: Secondary | ICD-10-CM

## 2020-02-08 DIAGNOSIS — R55 Syncope and collapse: Secondary | ICD-10-CM

## 2020-02-08 DIAGNOSIS — K219 Gastro-esophageal reflux disease without esophagitis: Secondary | ICD-10-CM

## 2020-02-08 DIAGNOSIS — G20 Parkinson's disease: Secondary | ICD-10-CM | POA: Diagnosis not present

## 2020-02-08 DIAGNOSIS — D72819 Decreased white blood cell count, unspecified: Secondary | ICD-10-CM

## 2020-02-08 DIAGNOSIS — I951 Orthostatic hypotension: Secondary | ICD-10-CM | POA: Diagnosis not present

## 2020-02-08 LAB — BASIC METABOLIC PANEL WITH GFR
Anion gap: 4 — ABNORMAL LOW (ref 5–15)
BUN: 13 mg/dL (ref 8–23)
CO2: 28 mmol/L (ref 22–32)
Calcium: 8.1 mg/dL — ABNORMAL LOW (ref 8.9–10.3)
Chloride: 107 mmol/L (ref 98–111)
Creatinine, Ser: 0.68 mg/dL (ref 0.61–1.24)
GFR calc Af Amer: >60 mL/min (ref >60)
GFR calc non Af Amer: >60 mL/min (ref >60)
Glucose, Bld: 98 mg/dL (ref 70–99)
Potassium: 3.5 mmol/L (ref 3.5–5.1)
Sodium: 139 mmol/L (ref 135–145)

## 2020-02-08 LAB — ECHOCARDIOGRAM COMPLETE
AR max vel: 2 cm2
AV Area VTI: 1.86 cm2
AV Area mean vel: 1.95 cm2
AV Mean grad: 6 mmHg
AV Peak grad: 11 mmHg
Ao pk vel: 1.66 m/s
Area-P 1/2: 4.06 cm2
Height: 72 in
S' Lateral: 3.02 cm
Weight: 2328.06 oz

## 2020-02-08 LAB — CBC
HCT: 24.9 % — ABNORMAL LOW (ref 39.0–52.0)
Hemoglobin: 8.5 g/dL — ABNORMAL LOW (ref 13.0–17.0)
MCH: 40.9 pg — ABNORMAL HIGH (ref 26.0–34.0)
MCHC: 34.1 g/dL (ref 30.0–36.0)
MCV: 119.7 fL — ABNORMAL HIGH (ref 80.0–100.0)
Platelets: 245 K/uL (ref 150–400)
RBC: 2.08 MIL/uL — ABNORMAL LOW (ref 4.22–5.81)
RDW: 13.4 % (ref 11.5–15.5)
WBC: 2.6 K/uL — ABNORMAL LOW (ref 4.0–10.5)
nRBC: 0 % (ref 0.0–0.2)

## 2020-02-08 LAB — MRSA PCR SCREENING: MRSA by PCR: NEGATIVE

## 2020-02-08 MED ORDER — CARBIDOPA-LEVODOPA 25-100 MG PO TABS
2.0000 | ORAL_TABLET | Freq: Every day | ORAL | Status: DC
  Filled 2020-02-08: qty 2

## 2020-02-08 MED ORDER — CARBIDOPA-LEVODOPA 25-100 MG PO TABS
1.0000 | ORAL_TABLET | Freq: Every day | ORAL | Status: DC
  Filled 2020-02-08: qty 1

## 2020-02-08 MED ORDER — CARBIDOPA-LEVODOPA 25-100 MG PO TABS
1.0000 | ORAL_TABLET | Freq: Four times a day (QID) | ORAL | Status: DC
  Administered 2020-02-08 (×2): 1 via ORAL
  Filled 2020-02-08 (×3): qty 1

## 2020-02-08 MED ORDER — CARBIDOPA-LEVODOPA 25-100 MG PO TABS
1.0000 | ORAL_TABLET | Freq: Every day | ORAL | Status: DC
  Administered 2020-02-08: 1 via ORAL
  Filled 2020-02-08: qty 1

## 2020-02-08 NOTE — Telephone Encounter (Signed)
I will get with Dr Silvio Pate. I already added one on the end of Tuesday's schedule. Will need him to let me know what to do.

## 2020-02-08 NOTE — Evaluation (Signed)
Physical Therapy Evaluation Patient Details Name: Casey Tucker MRN: [id number] DOB: 12-03-1940 Today's Date: 02/08/2020   History of Present Illness  79 y.o. male with medical history significant for Parkinson's disease status post deep brain stimulator, history of aortic stenosis and GERD who was brought into the ER by EMS after he had a witnessed syncopal episode while sitting in the chair.  Had similar passing out episode a few weeks ago.   Clinical Impression  Pt eager to show that he can go home ("soon") and was therefore very will to work with PT to prove this.  He was ultimately able to move well with bed mobility, transfers and with >100 ft walk to/from the stairwell along with negotiation of ~7 steps with L rail.  Pt did need cues to slow and use walker more appropriately as he was inconsistent with cadence, etc.  He did not have any overt LOBs but displayed some general instability that likely is not too far from his baseline (reports he had not gotten his Parkinson meds at time of PT exam).  Wife present and confirms that he is not too far from his normal, but agrees that he is a little weak and would benefit from HHPT to work on safe transition back to PLOF once discharged from the hospital.    Follow Up Recommendations Home health PT    Equipment Recommendations  None recommended by PT    Recommendations for Other Services       Precautions / Restrictions Precautions Precautions: Fall Restrictions Weight Bearing Restrictions: No      Mobility  Bed Mobility Overal bed mobility: Independent             General bed mobility comments: Pt did well getting to EOB, eager to show what he can do  Transfers Overall transfer level: Independent Equipment used: Rolling walker (2 wheeled)             General transfer comment: cuing for appropriate set up and sequencing, but able to rise w/o assist  Ambulation/Gait Ambulation/Gait assistance: Modified independent  (Device/Increase time) Gait Distance (Feet): 125 Feet Assistive device: Rolling walker (2 wheeled)       General Gait Details: Pt with confident bout of ambulation, but was inconsistent with walker use (at times even picking it up partially).  He did not have any overt LOBs but did have some occasional unsteadiness and needed cues t/o to slow and be more deliberate.    Stairs Stairs: Yes Stairs assistance: Supervision Stair Management: One rail Left Number of Stairs: 7 General stair comments: Pt was able to negotiate up/down steps with reciprocal strategy and single rail use   Wheelchair Mobility    Modified Rankin (Stroke Patients Only)       Balance Overall balance assessment: Modified Independent                                           Pertinent Vitals/Pain Pain Assessment: No/denies pain    Home Living Family/patient expects to be discharged to:: Private residence Living Arrangements: Spouse/significant other Available Help at Discharge: Family Type of Home: House Home Access: Stairs to enter Entrance Stairs-Rails: Left Entrance Stairs-Number of Steps: 3 Home Layout: One level (does have 3 steps to get to bed room, has rails) Home Equipment: Walker - 2 wheels;Walker - 4 wheels      Prior Function Level of  Independence: Independent with assistive device(s)         Comments: Wife will assist with some IADLs, but apparently he can do most of this w/o assist if needed.  Wife drives/runs errands but pt can/does get out and go with her some     Hand Dominance        Extremity/Trunk Assessment   Upper Extremity Assessment Upper Extremity Assessment: Generalized weakness (functional with mild Parkinsonian QofM)    Lower Extremity Assessment Lower Extremity Assessment: Generalized weakness (functional with mild Parkinsonian QofM)       Communication   Communication: No difficulties  Cognition Arousal/Alertness: Awake/alert Behavior  During Therapy: WFL for tasks assessed/performed Overall Cognitive Status: Within Functional Limits for tasks assessed                                        General Comments      Exercises     Assessment/Plan    PT Assessment Patient needs continued PT services  PT Problem List Decreased strength;Decreased range of motion;Decreased balance;Decreased knowledge of use of DME;Decreased safety awareness       PT Treatment Interventions DME instruction;Gait training;Stair training;Functional mobility training;Therapeutic activities;Therapeutic exercise;Balance training;Neuromuscular re-education;Patient/family education    PT Goals (Current goals can be found in the Care Plan section)  Acute Rehab PT Goals Patient Stated Goal: go home ASAP PT Goal Formulation: With patient/family Time For Goal Achievement: 02/22/20 Potential to Achieve Goals: Good    Frequency Min 2X/week   Barriers to discharge        Co-evaluation               AM-PAC PT "6 Clicks" Mobility  Outcome Measure Help needed turning from your back to your side while in a flat bed without using bedrails?: None Help needed moving from lying on your back to sitting on the side of a flat bed without using bedrails?: A Little Help needed moving to and from a bed to a chair (including a wheelchair)?: A Little Help needed standing up from a chair using your arms (e.g., wheelchair or bedside chair)?: A Little Help needed to walk in hospital room?: A Little Help needed climbing 3-5 steps with a railing? : A Little 6 Click Score: 19    End of Session Equipment Utilized During Treatment: Gait belt Activity Tolerance: Patient tolerated treatment well Patient left: with chair alarm set;with call bell/phone within reach Nurse Communication: Mobility status PT Visit Diagnosis: Other abnormalities of gait and mobility (R26.89);Ataxic gait (R26.0);Unsteadiness on feet (R26.81)    Time: 2563-8937 PT  Time Calculation (min) (ACUTE ONLY): 21 min   Charges:   PT Evaluation $PT Eval Low Complexity: 1 Low PT Treatments $Gait Training: 8-22 mins        Kreg Shropshire, DPT 02/08/2020, 12:28 PM

## 2020-02-08 NOTE — Consult Note (Signed)
Consultation Note Date: 02/08/2020   Patient Name: Casey Tucker  DOB: 10-Apr-1941  MRN: [id number]  Age / Sex: 79 y.o., male  PCP: [name], MD Referring Physician: [place] Eric J, DO  Reason for Consultation: Establishing goals of care  HPI/Patient Profile:Casey Tucker is a 79 y.o. male with medical history significant for Parkinson's disease status post deep brain stimulator, history of aortic stenosis and GERD who was brought into the ER by EMS after he had a witnessed syncopal episode while sitting in the chair.   Clinical Assessment and Goals of Care: Patient is resting in bed with wife at bedside. 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 He discusses displeasure with his hospital experience, and with healthcare in general this day in age. Patient seems confused at times during the conversation not answering the questions asked. Wife spoke about her feelings on healthcare.   Patient states he wants to continue to treat the treatable as long as he has QOL which is his independence, and dignity. He states things need to be explained better to him in the future. He states as far as GOC, he would like to take one day at a time. He continues to discuss healthcare as it used to be and as it is now.      SUMMARY OF RECOMMENDATIONS   Continue to treat the treatable.  Recommend outpatient palliative to continue [place] conversations with patient and family.      Prognosis:   Unable to determine       Primary Diagnoses: Present on Admission: . (Resolved) Syncope due to orthostatic hypotension . (Resolved) Syncope . GERD . Parkinson's disease ([place]) . Anemia . Leukopenia   I have reviewed the medical record, interviewed the patient and family, and examined the patient. The following aspects are pertinent.  Past Medical History:  Diagnosis Date  . Arthritis    left hip   . GERD  (gastroesophageal reflux disease) Pre 2002   Mild  . History of kidney stones   . 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 Parkinson disease ([place]) 2004   Slowly progressive  . Ruptured appendix teens   Social History   Socioeconomic History  . Marital status: Married    Spouse name: Not on file  . Number of children: 2  . Years of education: Not on file  . Highest education level: Doctorate  Occupational History  . Occupation: Optometrist    Comment: medically retired  Tobacco Use  . Smoking status: Never Smoker  . Smokeless tobacco: Never Used  Vaping Use  . Vaping Use: Never used  Substance and Sexual Activity  . Alcohol use: Yes    Alcohol/week: 7.0 standard drinks    Types: 7 Standard drinks or equivalent per week    Comment: occassionally  . Drug use: No  . Sexual activity: Yes  Other Topics Concern  . Not on file  Social History Narrative   Married, lives with  wife   2 daughters   Right handed   Has living will   Wife has health care POA---then daughters   Would still accept CPR--but no prolonged artificial means (ventilator or tube feeds)   Social Determinants of Health   Financial Resource Strain:   . Difficulty of Paying Living Expenses:   Food Insecurity:   . Worried About Charity fundraiser in the Last Year:   . Arboriculturist in the Last Year:   Transportation Needs:   . Film/video editor (Medical):   Marland Kitchen Lack of Transportation (Non-Medical):   Physical Activity:   . Days of Exercise per Week:   . Minutes of Exercise per Session:   Stress:   . Feeling of Stress :   Social Connections:   . Frequency of Communication with Friends and Family:   . Frequency of Social Gatherings with Friends and Family:   . Attends Religious Services:   . Active Member of Clubs or Organizations:   . Attends Archivist Meetings:   Marland Kitchen Marital Status:    Family History    Problem Relation Age of Onset  . Arthritis Mother        knee replacement  . COPD Father        emphysema, smoker  . Heart disease Father        CHF  . Healthy Sister   . Alcohol abuse Paternal Uncle   . Rheum arthritis Daughter    Scheduled Meds: . carbidopa-levodopa  1 tablet Oral QID  . [START ON 02/09/2020] carbidopa-levodopa  2 tablet Oral Daily  . gabapentin  100 mg Oral TID  . sodium chloride flush  3 mL Intravenous Q12H   Continuous Infusions: PRN Meds:.acetaminophen **OR** acetaminophen, magnesium hydroxide, ondansetron **OR** ondansetron (ZOFRAN) IV, polyethylene glycol Medications Prior to Admission:  Prior to Admission medications   Medication Sig Start Date End Date Taking? Authorizing Provider  carbidopa-levodopa (SINEMET IR) 25-100 MG tablet TAKE TWO TABLETS AT 7:30AM, ONE AT 10AM,ONE AT 1PM, ONE AT 4PM AND 1 AT 7PM. 01/24/20  Yes Tat, Eustace Quail, DO  gabapentin (NEURONTIN) 100 MG capsule Take 1 capsule (100 mg total) by mouth 3 (three) times daily. And 200mg  at bedtime (2 capsules) 01/12/20  Yes [name], MD  magnesium hydroxide (MILK OF MAGNESIA) 400 MG/5ML suspension Take 5 mLs by mouth daily as needed for mild constipation.   Yes [provider]  polyethylene glycol (MIRALAX / GLYCOLAX) 17 g packet Take 17 g by mouth daily as needed.    Yes [provider]  meloxicam (MOBIC) 15 MG tablet Take 15 mg by mouth daily. 02/06/20   [provider]   Allergies  Allergen Reactions  . Amoxicillin   . Celebrex [Celecoxib]   . Demerol Other (See Comments)    Hallucinations   . Penicillins     REACTION: RASH   Review of Systems  All other systems reviewed and are negative.   Physical Exam Pulmonary:     Effort: Pulmonary effort is normal.  Neurological:     Mental Status: He is alert.     Vital Signs: BP (!) 100/53 (BP Location: Left Arm)   Pulse 62   Temp 98.6 F (37 C) (Oral)   Resp 19   Ht 6' (1.829 m)   Wt 65.6 kg    SpO2 100%   BMI 19.61 kg/m  Pain Scale: 0-10 POSS *See Group Information*: 1-Acceptable,Awake and alert Pain Score: 0-No pain  SpO2: SpO2: 100 % O2 Device:SpO2: 100 % O2 Flow Rate: .   IO: Intake/output summary:   Intake/Output Summary (Last 24 hours) at 02/08/2020 1600 Last data filed at 02/08/2020 1019 Gross per 24 hour  Intake 240 ml  Output 350 ml  Net -110 ml    LBM: Last BM Date: 02/08/20 Baseline Weight: Weight: 66 kg Most recent weight: Weight: 65.6 kg     Palliative Assessment/Data:     Time In: 2:50 Time Out: 3:20 Time Total: 30 min Greater than 50%  of this time was spent counseling and coordinating care related to the above assessment and plan.  Signed by: Asencion Gowda, NP   Please contact Palliative Medicine Team phone at [phone] for questions and concerns.  For individual provider: See Shea Evans

## 2020-02-08 NOTE — Progress Notes (Signed)
This morning pt very agitated and wanting to leave. Pt pulled IV out and took telemetry off and is refusing to put it back on. MD aware. Wife helped me calm pt down and talked him into staying until at least this afternoon. Will continue monitor.

## 2020-02-08 NOTE — Consult Note (Addendum)
Reason for Consult:Syncope Requesting Physician: [place]  CC: Syncope  I have been asked by Dr. [place] to see this patient in consultation for Syncope.  HPI: Casey Tucker is an 79 y.o. male with medical history significant for Parkinson's disease s/p DBS, aortic stenosis and GERD who was brought into the ER by EMS after he had a witnessed syncopal episode while sitting in the chair.  His wife states that she had come into the house and found him passed out in his chair while watching TV.  He was initially difficult to arouse but eventually woke up after a couple of minutes.   Patient had a similar episode about 3 weeks ago and presented to the emergency room but declined hospitalization at that time.  When EMS arrived she was noted to have a systolic blood pressure of 90 mmHg while lying down and 29mmHg while standing.  Past Medical History:  Diagnosis Date  . Arthritis    left hip   . GERD (gastroesophageal reflux disease) Pre 2002   Mild  . History of kidney stones   . 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 Parkinson disease ([place]) 2004   Slowly progressive  . Ruptured appendix teens    Past Surgical History:  Procedure Laterality Date  . APPENDECTOMY    . CATARACT EXTRACTION W/ INTRAOCULAR LENS  IMPLANT, BILATERAL  2017  . DEEP BRAIN STIMULATOR PLACEMENT  8/14   L STN  . DOPPLER ECHOCARDIOGRAPHY  01/10/2009   LV NML Mild LVH EF 60-65% aortic sclerosis w/0 stenosis   . JOINT REPLACEMENT     left hip replacement 08/14/11/[place]   . SUBTHALAMIC STIMULATOR BATTERY REPLACEMENT Left 03/25/2017   Procedure: Deep Brain Stimulator battery replacement;  Surgeon: Erline Levine, MD;  Location: [facility];  Service: Neurosurgery;  Laterality: Left;  left  . TONSILLECTOMY    . TOTAL HIP REVISION  08/27/2011   Procedure: TOTAL HIP REVISION;  Surgeon: Mauri Pole, MD;  Location: [facility];  Service: Orthopedics;   Laterality: Left;    Family History  Problem Relation Age of Onset  . Arthritis Mother        knee replacement  . COPD Father        emphysema, smoker  . Heart disease Father        CHF  . Healthy Sister   . Alcohol abuse Paternal Uncle   . Rheum arthritis Daughter     Social History:  reports that he has never smoked. He has never used smokeless tobacco. He reports current alcohol use of about 7.0 standard drinks of alcohol per week. He reports that he does not use drugs.  Allergies  Allergen Reactions  . Amoxicillin   . Celebrex [Celecoxib]   . Demerol Other (See Comments)    Hallucinations   . Penicillins     REACTION: RASH    Medications:  I have reviewed the patient's current medications. Prior to Admission:  Medications Prior to Admission  Medication Sig Dispense Refill Last Dose  . carbidopa-levodopa (SINEMET IR) 25-100 MG tablet TAKE TWO TABLETS AT 7:30AM, ONE AT 10AM,ONE AT 1PM, ONE AT 4PM AND 1 AT 7PM. 180 tablet 0 02/07/2020 at 1000  . gabapentin (NEURONTIN) 100 MG capsule Take 1 capsule (100 mg total) by mouth 3 (three) times daily. And 200mg  at bedtime (2 capsules) 1 capsule 0 Past Week at Unknown time  . magnesium hydroxide (MILK OF MAGNESIA) 400  MG/5ML suspension Take 5 mLs by mouth daily as needed for mild constipation.   Past Month at PRN  . polyethylene glycol (MIRALAX / GLYCOLAX) 17 g packet Take 17 g by mouth daily as needed.    Past Month at PRN  . meloxicam (MOBIC) 15 MG tablet Take 15 mg by mouth daily.      Scheduled: . carbidopa-levodopa  1 tablet Oral QID  . [START ON 02/09/2020] carbidopa-levodopa  2 tablet Oral Daily  . gabapentin  100 mg Oral TID  . sodium chloride flush  3 mL Intravenous Q12H    ROS: History obtained from the patient  General ROS: negative for - chills, fatigue, fever, night sweats, weight gain or weight loss Psychological ROS: negative for - behavioral disorder, hallucinations, memory difficulties, mood swings or suicidal  ideation Ophthalmic ROS: negative for - blurry vision, double vision, eye pain or loss of vision ENT ROS: negative for - epistaxis, nasal discharge, oral lesions, sore throat, tinnitus or vertigo Allergy and Immunology ROS: negative for - hives or itchy/watery eyes Hematological and Lymphatic ROS: negative for - bleeding problems, bruising or swollen lymph nodes Endocrine ROS: negative for - galactorrhea, hair pattern changes, polydipsia/polyuria or temperature intolerance Respiratory ROS: negative for - cough, hemoptysis, shortness of breath or wheezing Cardiovascular ROS: negative for - chest pain, dyspnea on exertion, edema or irregular heartbeat Gastrointestinal ROS: negative for - abdominal pain, diarrhea, hematemesis, nausea/vomiting or stool incontinence Genito-Urinary ROS: negative for - dysuria, hematuria, incontinence or urinary frequency/urgency Musculoskeletal ROS: negative for - joint swelling or muscular weakness Neurological ROS: as noted in HPI Dermatological ROS: negative for rash and skin lesion changes  Physical Examination: Blood pressure (!) 100/53, pulse 62, temperature 98.6 F (37 C), temperature source Oral, resp. rate 19, height 6' (1.829 m), weight 65.6 kg, SpO2 100 %.  HEENT-  Normocephalic, no lesions, without obvious abnormality.  Normal external eye and conjunctiva.  Normal TM's bilaterally.  Normal auditory canals and external ears. Normal external nose, mucus membranes and septum.  Normal pharynx. Cardiovascular- S1, S2 normal, pulses palpable throughout   Lungs- chest clear, no wheezing, rales, normal symmetric air entry Abdomen- soft, non-tender; bowel sounds normal; no masses,  no organomegaly Extremities- no edema Lymph-no adenopathy palpable Musculoskeletal-no joint tenderness, deformity or swelling Skin-warm and dry, no hyperpigmentation, vitiligo, or suspicious lesions  Neurological Examination   Mental Status: Alert, oriented, thought content  appropriate.  Speech fluent without evidence of aphasia but dysarthric.  Able to follow 3 step commands without difficulty. Cranial Nerves: II: Visual fields grossly normal, pupils equal, round, reactive to light and accommodation III,IV, VI: ptosis not present, extra-ocular motions intact bilaterally V,VII: smile symmetric, facial light touch sensation normal bilaterally VIII: hearing normal bilaterally XI: bilateral shoulder shrug XII: midline tongue extension Motor: 5/5 strength in the BUE's with 4/5 strength in the lower extremities.  Increased tone throughout, left greater than right.   Sensory: Pinprick and light touch intact throughout, bilaterally Deep Tendon Reflexes: Symmetric throughout Plantars: Right: mute   Left: mute  Laboratory Studies:   Basic Metabolic Panel: Recent Labs  Lab 02/07/20 1209 02/08/20 0429  NA 138 139  K 3.7 3.5  CL 105 107  CO2 25 28  GLUCOSE 109* 98  BUN 21 13  CREATININE 0.65 0.68  CALCIUM 7.6* 8.1*    Liver Function Tests: No results for input(s): AST, ALT, ALKPHOS, BILITOT, PROT, ALBUMIN in the last 168 hours. No results for input(s): LIPASE, AMYLASE in the last 168 hours. No  results for input(s): AMMONIA in the last 168 hours.  CBC: Recent Labs  Lab 02/07/20 1209 02/08/20 0429  WBC 2.2* 2.6*  NEUTROABS 0.9*  --   HGB 7.6* 8.5*  HCT 21.9* 24.9*  MCV 121.0* 119.7*  PLT 218 245    Cardiac Enzymes: No results for input(s): CKTOTAL, CKMB, CKMBINDEX, TROPONINI in the last 168 hours.  BNP: Invalid input(s): POCBNP  CBG: No results for input(s): GLUCAP in the last 168 hours.  Microbiology: Results for orders placed or performed during the hospital encounter of 02/07/20  SARS CORONAVIRUS 2 (TAT 6-24 HRS) Nasopharyngeal Nasopharyngeal Swab     Status: None   Collection Time: 02/07/20  2:46 PM   Specimen: Nasopharyngeal Swab  Result Value Ref Range Status   SARS Coronavirus 2 NEGATIVE NEGATIVE Final    Comment:  (NOTE) SARS-CoV-2 target nucleic acids are NOT DETECTED.  The SARS-CoV-2 RNA is generally detectable in upper and lower respiratory specimens during the acute phase of infection. Negative results do not preclude SARS-CoV-2 infection, do not rule out co-infections with other pathogens, and should not be used as the sole basis for treatment or other patient management decisions. Negative results must be combined with clinical observations, patient history, and epidemiological information. The expected result is Negative.  Fact Sheet for Patients: SugarRoll.be  Fact Sheet for Healthcare Providers: https://www.woods-mathews.com/  This test is not yet approved or cleared by the [place] FDA and  has been authorized for detection and/or diagnosis of SARS-CoV-2 by FDA under an Emergency Use Authorization (EUA). This EUA will remain  in effect (meaning this test can be used) for the duration of the COVID-19 declaration under Se ction 564(b)(1) of the Act, 21 U.S.C. section 360bbb-3(b)(1), unless the authorization is terminated or revoked sooner.  Performed at [facility], [place] [address] [place], [place] [address]   MRSA PCR Screening     Status: None   Collection Time: 02/07/20 11:33 PM   Specimen: Nasopharyngeal  Result Value Ref Range Status   MRSA by PCR NEGATIVE NEGATIVE Final    Comment:        The GeneXpert MRSA Assay (FDA approved for NASAL specimens only), is one component of a comprehensive MRSA colonization surveillance program. It is not intended to diagnose MRSA infection nor to guide or monitor treatment for MRSA infections. Performed at [facility], [address] [place],  [address]     Coagulation Studies: No results for input(s): LABPROT, INR in the last 72 hours.  Urinalysis: No results for input(s): COLORURINE, LABSPEC, PHURINE, GLUCOSEU, HGBUR, BILIRUBINUR, KETONESUR, PROTEINUR,  UROBILINOGEN, NITRITE, LEUKOCYTESUR in the last 168 hours.  Invalid input(s): APPERANCEUR  Lipid Panel:     Component Value Date/Time   CHOL 166 03/16/2012 1029   TRIG 30.0 03/16/2012 1029   HDL 62.10 03/16/2012 1029   CHOLHDL 3 03/16/2012 1029   VLDL 6.0 03/16/2012 1029   LDLCALC 98 03/16/2012 1029    HgbA1C: No results found for: HGBA1C  Urine Drug Screen:  No results found for: LABOPIA, COCAINSCRNUR, LABBENZ, AMPHETMU, THCU, LABBARB  Alcohol Level: No results for input(s): ETH in the last 168 hours.  Other results: EKG: junctional rhythm at 55 bpm.  Imaging: CT Head Wo Contrast  Result Date: 02/07/2020 CLINICAL DATA:  Mental status change, unknown cause. Additional history provided: Syncopal episode while sitting in chair. EXAM: CT HEAD WITHOUT CONTRAST TECHNIQUE: Contiguous axial images were obtained from the base of the skull through the vertex without intravenous contrast. COMPARISON:  Head CT 01/18/2020 FINDINGS: Brain: Unchanged position of a left frontal approach deep brain stimulator lead. Cerebral volume is normal for age. There is no acute intracranial hemorrhage. No demarcated cortical infarct. No extra-axial fluid collection. No evidence of intracranial mass. No midline shift. Vascular: No hyperdense vessel. Skull: Normal. Negative for fracture or focal lesion. Sinuses/Orbits: Visualized orbits show no acute finding. No significant paranasal sinus disease or mastoid effusion at the imaged levels. IMPRESSION: Stable non-contrast CT appearance of the brain as compared to 01/18/2020. No evidence of acute intracranial abnormality. Electronically Signed   By: Kellie Simmering DO   On: 02/07/2020 13:22     Assessment/Plan: 79 y.o. male with medical history significant for Parkinson's disease s/p DBS, aortic stenosis and GERD who was brought into the ER by EMS after he had a witnessed syncopal episode while sitting in the chair.  His wife states that she had come into the house and  found him passed out in his chair while watching TV.  He was initially difficult to arouse but eventually woke up after a couple of minutes.  Head CT personally reviewed and shows no acute changes.   Patient had a similar episode about 3 weeks ago and presented to the emergency room but declined hospitalization at that time.   Patient found to be orthostatic.  This orthostasis likely a combination of his medications and his PD.  Before further addressing his medications (patient currently having some medications adjusted on an outpatient basis), will attempt some more conservative measures.    Recommendations: 1. Compression hose to be worn daily 2. Patient to be vigilant about hydration 3. Follow up with Dr. Carles Collet on an outpatient basis.   4. Continue current medical regimen for PD  Alexis Goodell, MD Neurology [phone] 02/08/2020, 1:46 PM

## 2020-02-08 NOTE — Discharge Instructions (Signed)
Anemia  Anemia is a condition in which you do not have enough red blood cells or hemoglobin. Hemoglobin is a substance in red blood cells that carries oxygen. When you do not have enough red blood cells or hemoglobin (are anemic), your body cannot get enough oxygen and your organs may not work properly. As a result, you may feel very tired or have other problems. What are the causes? Common causes of anemia include:  Excessive bleeding. Anemia can be caused by excessive bleeding inside or outside the body, including bleeding from the intestine or from periods in women.  Poor nutrition.  Long-lasting (chronic) kidney, thyroid, and liver disease.  Bone marrow disorders.  Cancer and treatments for cancer.  HIV (human immunodeficiency virus) and AIDS (acquired immunodeficiency syndrome).  Treatments for HIV and AIDS.  Spleen problems.  Blood disorders.  Infections, medicines, and autoimmune disorders that destroy red blood cells. What are the signs or symptoms? Symptoms of this condition include:  Minor weakness.  Dizziness.  Headache.  Feeling heartbeats that are irregular or faster than normal (palpitations).  Shortness of breath, especially with exercise.  Paleness.  Cold sensitivity.  Indigestion.  Nausea.  Difficulty sleeping.  Difficulty concentrating. Symptoms may occur suddenly or develop slowly. If your anemia is mild, you may not have symptoms. How is this diagnosed? This condition is diagnosed based on:  Blood tests.  Your medical history.  A physical exam.  Bone marrow biopsy. Your health care provider may also check your stool (feces) for blood and may do additional testing to look for the cause of your bleeding. You may also have other tests, including:  Imaging tests, such as a CT scan or MRI.  Endoscopy.  Colonoscopy. How is this treated? Treatment for this condition depends on the cause. If you continue to lose a lot of blood, you  may need to be treated at a hospital. Treatment may include:  Taking supplements of iron, vitamin T01, or folic acid.  Taking a hormone medicine (erythropoietin) that can help to stimulate red blood cell growth.  Having a blood transfusion. This may be needed if you lose a lot of blood.  Making changes to your diet.  Having surgery to remove your spleen. Follow these instructions at home:  Take over-the-counter and prescription medicines only as told by your health care provider.  Take supplements only as told by your health care provider.  Follow any diet instructions that you were given.  Keep all follow-up visits as told by your health care provider. This is important. Contact a health care provider if:  You develop new bleeding anywhere in the body. Get help right away if:  You are very weak.  You are short of breath.  You have pain in your abdomen or chest.  You are dizzy or feel faint.  You have trouble concentrating.  You have bloody or black, tarry stools.  You vomit repeatedly or you vomit up blood. Summary  Anemia is a condition in which you do not have enough red blood cells or enough of a substance in your red blood cells that carries oxygen (hemoglobin).  Symptoms may occur suddenly or develop slowly.  If your anemia is mild, you may not have symptoms.  This condition is diagnosed with blood tests as well as a medical history and physical exam. Other tests may be needed.  Treatment for this condition depends on the cause of the anemia. This information is not intended to replace advice given to you  by your health care provider. Make sure you discuss any questions you have with your health care provider. Document Revised: 06/18/2017 Document Reviewed: 08/07/2016 Elsevier Patient Education  [place].   Near-Syncope Near-syncope is when you suddenly get weak or dizzy, or you feel like you might pass out (faint). This may also be called  presyncope. This is due to a lack of blood flow to the brain. During an episode of near-syncope, you may:  Feel dizzy, weak, or light-headed.  Feel sick to your stomach (nauseous).  See all white or all black.  See spots.  Have cold, clammy skin. This condition is caused by a sudden decrease in blood flow to the brain. This decrease can result from various causes, but most of those causes are not dangerous. However, near-syncope may be a sign of a serious medical problem, so it is important to seek medical care. Follow these instructions at home: Medicines  Take over-the-counter and prescription medicines only as told by your doctor.  If you are taking blood pressure or heart medicine, get up slowly and spend many minutes getting ready to sit and then stand. This can help with dizziness. General instructions  Be aware of any changes in your symptoms.  Talk with your doctor about your symptoms. You may need to have testing to find the cause of your near-syncope.  If you start to feel like you might pass out, lie down right away. Raise (elevate) your feet above the level of your heart. Breathe deeply and steadily. Wait until all of the symptoms are gone.  Have someone stay with you until you feel stable.  Do not drive, use machinery, or play sports until your doctor says it is okay.  Drink enough fluid to keep your pee (urine) pale yellow.  Keep all follow-up visits as told by your doctor. This is important. Get help right away if you:  Have a seizure.  Have pain in your: ? Chest. ? Belly (abdomen). ? Back.  Faint once or more than once.  Have a very bad headache.  Are bleeding from your mouth or butt.  Have black or tarry poop (stool).  Have a very fast or uneven heartbeat (palpitations).  Are mixed up (confused).  Have trouble walking.  Are very weak.  Have trouble seeing. These symptoms may be an emergency. Do not wait to see if the symptoms will go away.  Get medical help right away. Call your local emergency services (911 in the [place] Do not drive yourself to the hospital. Summary  Near-syncope is when you suddenly get weak or dizzy, or you feel like you might pass out (faint).  This condition is caused by a lack of blood flow to the brain.  Near-syncope may be a sign of a serious medical problem, so it is important to seek medical care. This information is not intended to replace advice given to you by your health care provider. Make sure you discuss any questions you have with your health care provider. Document Revised: 10/28/2018 Document Reviewed: 05/25/2018 Elsevier Patient Education  [place].   Syncope Syncope is when you pass out (faint) for a short time. It is caused by a sudden decrease in blood flow to the brain. Signs that you may be about to pass out include:  Feeling dizzy or light-headed.  Feeling sick to your stomach (nauseous).  Seeing all white or all black.  Having cold, clammy skin. If you pass out, get help right away. Call  your local emergency services (911 in the [place] Do not drive yourself to the hospital. Follow these instructions at home: Watch for any changes in your symptoms. Take these actions to stay safe and help with your symptoms: Lifestyle  Do not drive, use machinery, or play sports until your doctor says it is okay.  Do not drink alcohol.  Do not use any products that contain nicotine or tobacco, such as cigarettes and e-cigarettes. If you need help quitting, ask your doctor.  Drink enough fluid to keep your pee (urine) pale yellow. General instructions  Take over-the-counter and prescription medicines only as told by your doctor.  If you are taking blood pressure or heart medicine, sit up and stand up slowly. Spend a few minutes getting ready to sit and then stand. This can help you feel less dizzy.  Have someone stay with you until you feel stable.  If you start to feel  like you might pass out, lie down right away and raise (elevate) your feet above the level of your heart. Breathe deeply and steadily. Wait until all of the symptoms are gone.  Keep all follow-up visits as told by your doctor. This is important. Get help right away if:  You have a very bad headache.  You pass out once or more than once.  You have pain in your chest, belly, or back.  You have a very fast or uneven heartbeat (palpitations).  It hurts to breathe.  You are bleeding from your mouth or your bottom (rectum).  You have black or tarry poop (stool).  You have jerky movements that you cannot control (seizure).  You are confused.  You have trouble walking.  You are very weak.  You have vision problems. These symptoms may be an emergency. Do not wait to see if the symptoms will go away. Get medical help right away. Call your local emergency services (911 in the [place] Do not drive yourself to the hospital. Summary  Syncope is when you pass out (faint) for a short time. It is caused by a sudden decrease in blood flow to the brain.  Signs that you may be about to faint include feeling dizzy, light-headed, or sick to your stomach, seeing all white or all black, or having cold, clammy skin.  If you start to feel like you might pass out, lie down right away and raise (elevate) your feet above the level of your heart. Breathe deeply and steadily. Wait until all of the symptoms are gone. This information is not intended to replace advice given to you by your health care provider. Make sure you discuss any questions you have with your health care provider. Document Revised: 08/18/2017 Document Reviewed: 08/18/2017 Elsevier Patient Education  [place] Disease Parkinson's disease is a type of movement disorder. It is a long-term condition that gets worse over time (is progressive). Each person with Parkinson's disease is affected differently. This condition  limits your ability to control movements and move your body normally. The condition can range from mild to severe. Parkinson's disease tends to get worse slowly over several years. What are the causes? Parkinson's disease results from a loss of brain cells (neurons) that make a brain chemical called dopamine. Dopamine is needed to control movement. As the condition gets worse, neurons make less dopamine. This makes it hard to move or control your movements. The exact cause of the loss of neurons and why they make less dopamine is not known. Factors related to  genes and the environment may contribute to the cause of Parkinson's disease. What increases the risk? The following factors may make you more likely to develop this condition:  Being male.  Being age 37 or older.  Having a family history of Parkinson's disease.  Having had a traumatic brain injury.  Having experienced depression.  Having been exposed to toxins, such as pesticides. What are the signs or symptoms? Symptoms of this condition can vary. The main symptoms are related to movement. These include:  A tremor or shaking while you are resting that you cannot control.  Stiffness in your neck, arms, and legs (rigidity).  Slowing of movement. You may lose facial expressions and have trouble making small movements that are needed to button clothing or brush your teeth.  An abnormal walk. You may walk with short, shuffling steps.  Loss of balance and stability when standing. You may sway, fall backward, and have trouble making turns. Other symptoms include:  Mental or cognitive changes including depression, anxiety, having false beliefs (delusions), or seeing, hearing, or feeling things that do not exist (hallucinations).  Trouble speaking or swallowing.  Changes in bowel or bladder functions including constipation, having to go urgently or frequently, or not being able to control your bowel or bladder.  Changes in sleep  habits or trouble sleeping. Parkinson's disease may be graded by severity of your condition as mild, moderate, or advanced. Parkinson's disease progression is different for everyone. You may not progress to the advanced stage.  Mild Parkinson's disease involves: ? Movement problems that do not affect daily activities. ? Movement problems on one side of the body.  Moderate Parkinson's disease involves: ? Movement problems on both sides of the body. ? Slowing of movement. ? Coordination and balance problems.  Advanced Parkinson's disease involves: ? Extreme difficulty walking. ? Inability to live alone safely. ? Signs of dementia, such as having trouble remembering things, doing daily tasks such as getting dressed, and problem solving. How is this diagnosed? This condition is diagnosed by a specialist. A diagnosis may be made based on symptoms, your medical history, and a physical exam. You may also have brain imaging tests to check for a loss of dopamine-producing areas of the brain. How is this treated? There is no cure for Parkinson's disease. Treatment focuses on managing your symptoms. Treatment may include:  Medicines. Everyone responds to medicines differently. Your response may change over time. Work with your health care provider to find the best medicines for you.  Speech, occupational, and physical therapy.  Deep brain stimulation surgery to reduce tremors and other involuntary movements. Follow these instructions at home: Medicines  Take over-the-counter and prescription medicines only as told by your health care provider.  Avoid taking medicines that can affect thinking, such as pain or sleeping medicines. Eating and drinking  Follow instructions from your health care provider about eating or drinking restrictions.  Do not drink alcohol. Activity  Talk with your health care provider about if it is safe for you to drive.  Do exercises as told by your health care  provider or physical therapist. Lifestyle      Install grab bars and railings in your home to prevent falls.  Do not use any products that contain nicotine or tobacco, such as cigarettes, e-cigarettes, and chewing tobacco. If you need help quitting, ask your health care provider.  Consider joining a support group for people with Parkinson's disease. General instructions  Work with your health care provider to determine what  you need help with and what your safety needs are.  Keep all follow-up visits as told by your health care provider, including any visits with a physical therapist, speech therapist, or occupational therapist. This is important. Contact a health care provider if:  Medicines do not help your symptoms.  You are unsteady or have fallen at home.  You need more support to function well at home.  You have trouble swallowing.  You have severe constipation.  You are having problems with side effects from your medicines.  You feel confused, anxious, or depressed. Get help right away if you:  Are injured after a fall.  See or hear things that are not real.  Cannot swallow without choking.  Have chest pain or trouble breathing.  Do not feel safe at home.  Have thoughts about hurting yourself or others. If you ever feel like you may hurt yourself or others, or have thoughts about taking your own life, get help right away. You can go to your nearest emergency department or call:  Your local emergency services (911 in the [place]  A suicide crisis helpline, such as the [place] [facility] at 979 689 9700. This is open 24 hours a day. Summary  Parkinson's disease is a long-term condition that gets worse over time. This condition limits your ability to control your movements and move your body normally.  There is no cure for Parkinson's disease. Treatment focuses on managing your symptoms.  Work with your health care provider to determine  what you need help with and what your safety needs are.  Keep all follow-up visits as told by your health care provider, including any visits with a physical therapist, speech therapist, or occupational therapist. This is important. This information is not intended to replace advice given to you by your health care provider. Make sure you discuss any questions you have with your health care provider. Document Revised: 09/22/2018 Document Reviewed: 09/22/2018 Elsevier Patient Education  [place].

## 2020-02-08 NOTE — Discharge Summary (Signed)
Physician Discharge Summary  Casey Tucker:[id number] DOB: June 26, 1941 DOA: 02/07/2020  PCP: [name], MD  Admit date: 02/07/2020 Discharge date: 02/08/2020  Admitted From: Home Disposition: Home  Recommendations for Outpatient Follow-up:  1. Follow up with PCP in 1-2 weeks 2. Follow-up with neurology, Dr. Carles Collet in 2 weeks 3. Advised patient to use TED hose 4. Referral to outpatient palliative care for assistance with goals of care medical decision making given his advanced Parkinson's disease with dementia  Home Health: Yes, PT/OT/RN/aide/social work Equipment/Devices: None  Discharge Condition: Stable CODE STATUS: Full code Diet recommendation: Regular diet  History of present illness:  Casey Tucker is a 79 y.o. male with medical history significant for Parkinson's disease status post deep brain stimulator and GERD who was brought into the ER by EMS after he had a witnessed syncopal episode while sitting in the chair.  His wife states that she had come into the house and found him passed out in his chair while watching TV.  He was initially difficult to arouse but eventually woke up after a couple of minutes.    Patient had a similar episode about 3 weeks ago and presented to the emergency room but declined hospitalization at that time.  When EMS arrived she was noted to have a systolic blood pressure of 90 mmHg while lying down and 39mmHg while standing.  He denies having any chest pain, no shortness of breath, no fever, no cough, no changes in his bowel habits, no urinary symptoms.  In the ER reveal a white cell count of 2.7, hemoglobin of 7.6 with a hematocrit of 21.9.  Patient stool is heme-negative.  Baseline hemoglobin is about 9.5g/dl. Patient had a CT scan of the brain without contrast which did not show any acute findings. EKG shows sinus bradycardia with a rate of 55.  ED P requested admission for further up with observation, evaluation and treatment.  Hospital  course:  Syncopal episode Patient presenting via EMS to the ED following syncopal episode at home. This is apparently been a recurrent event. Patient has underlying advanced Parkinson's disease with some underlying mild dementia with etiology likely secondary to autonomic dysfunction. CT head without contrast with no acute intracranial abnormality. Patient was seen by cardiology and echocardiogram was performed that showed a preserved LVEF and no other valvular abnormalities. Patient was monitored on telemetry during hospitalization with no significant findings. Patient was also evaluated by neurology who recommended TED hose at all times. Patient will need follow-up with his primary neurologist following discharge.  Anemia of chronic disease Leukopenia Hemoglobin on presentation 7.6. Iron within normal limits at 179 with a TIBC low at 218. Patient also has low white blood cell count of 2.6. Unclear etiology, suspect possible marrow suppression. Given his advanced age and underlying Parkinson's disease; little benefit for further aggressive evaluation but could consider outpatient hematology referral. Hemoglobin at time of discharge 8.5; and he did not require any transfusions during the hospitalization  Parkinson's disease with dementia Patient with fairly progressive advanced Parkinson's disease with some underlying dementia. Daughter reports that she has seen a significant change over the past year. Patient is adamant about maintaining his residence with his wife. Currently has home health assisting, but sometimes his wife and himself refused care occasionally. Was seen by neurology while inpatient with recommendations to continue his home Sinemet at current doses. Has had extensive therapy in the past including brain stimulation. Will refer outpatient palliative care for assistance with goals of care and  medical decision making as he remains a full code at this time.  Discharge Diagnoses:  Active  Problems:   GERD   Parkinson's disease ([place])   Anemia   Leukopenia  Discharge Instructions  Discharge Instructions    Amb Referral to Palliative Care   Complete by: As directed    Patient with advanced Parkinson's disease, please assist with goals of care, medical decision making   Call MD for:  extreme fatigue   Complete by: As directed    Call MD for:  hives   Complete by: As directed    Call MD for:  persistant dizziness or light-headedness   Complete by: As directed    Call MD for:  persistant nausea and vomiting   Complete by: As directed    Call MD for:  redness, tenderness, or signs of infection (pain, swelling, redness, odor or green/yellow discharge around incision site)   Complete by: As directed    Call MD for:  severe uncontrolled pain   Complete by: As directed    Call MD for:  temperature >100.4   Complete by: As directed    Diet - low sodium heart healthy   Complete by: As directed    Increase activity slowly   Complete by: As directed      Allergies as of 02/08/2020      Reactions   Amoxicillin    Celebrex [celecoxib]    Demerol Other (See Comments)   Hallucinations    Penicillins    REACTION: RASH      Medication List    TAKE these medications   carbidopa-levodopa 25-100 MG tablet Commonly known as: SINEMET IR TAKE TWO TABLETS AT 7:30AM, ONE AT 10AM,ONE AT 1PM, ONE AT 4PM AND 1 AT 7PM.   gabapentin 100 MG capsule Commonly known as: NEURONTIN Take 1 capsule (100 mg total) by mouth 3 (three) times daily. And 200mg  at bedtime (2 capsules)   magnesium hydroxide 400 MG/5ML suspension Commonly known as: MILK OF MAGNESIA Take 5 mLs by mouth daily as needed for mild constipation.   meloxicam 15 MG tablet Commonly known as: MOBIC Take 15 mg by mouth daily.   polyethylene glycol 17 g packet Commonly known as: MIRALAX / GLYCOLAX Take 17 g by mouth daily as needed.       Follow-up Information    [name], MD. Schedule an appointment as  soon as possible for a visit in 1 week(s).   Specialties: Internal Medicine, Pediatrics Contact information: [address] [place] [address] [phone]        Tat, Eustace Quail, DO. Schedule an appointment as soon as possible for a visit in 2 week(s).   Specialty: Neurology Contact information: [address]  [address] [place] [address] [phone]              Allergies  Allergen Reactions  . Amoxicillin   . Celebrex [Celecoxib]   . Demerol Other (See Comments)    Hallucinations   . Penicillins     REACTION: RASH    Consultations:  Neurology, Dr. Doy Mince  Cardiology, Dr. Nehemiah Massed   Procedures/Studies: CT Head Wo Contrast  Result Date: 02/07/2020 CLINICAL DATA:  Mental status change, unknown cause. Additional history provided: Syncopal episode while sitting in chair. EXAM: CT HEAD WITHOUT CONTRAST TECHNIQUE: Contiguous axial images were obtained from the base of the skull through the vertex without intravenous contrast. COMPARISON:  Head CT 01/18/2020 FINDINGS: Brain: Unchanged position of a left frontal  approach deep brain stimulator lead. Cerebral volume is normal for age. There is no acute intracranial hemorrhage. No demarcated cortical infarct. No extra-axial fluid collection. No evidence of intracranial mass. No midline shift. Vascular: No hyperdense vessel. Skull: Normal. Negative for fracture or focal lesion. Sinuses/Orbits: Visualized orbits show no acute finding. No significant paranasal sinus disease or mastoid effusion at the imaged levels. IMPRESSION: Stable non-contrast CT appearance of the brain as compared to 01/18/2020. No evidence of acute intracranial abnormality. Electronically Signed   By: Kellie Simmering DO   On: 02/07/2020 13:22   CT Head Wo Contrast  Result Date: 01/18/2020 CLINICAL DATA:  Syncope, hypotension, headache EXAM: CT HEAD WITHOUT CONTRAST TECHNIQUE: Contiguous axial images were obtained from the base of the skull  through the vertex without intravenous contrast. COMPARISON:  03/06/2015 FINDINGS: Brain: No acute infarct or hemorrhage. Deep brain stimulator lead from a left frontal approach unchanged since prior MRI. Lateral ventricles and midline structures are unremarkable. No acute extra-axial fluid collections. No mass effect. Vascular: No hyperdense vessel or unexpected calcification. Skull: Postsurgical changes from left frontal burr hole and stimulator lead placement. No acute bony abnormalities. Sinuses/Orbits: No acute finding. Other: None. IMPRESSION: 1. No acute intracranial process. Electronically Signed   By: Randa Ngo M.D.   On: 01/18/2020 22:54   DG Chest Portable 1 View  Result Date: 01/18/2020 CLINICAL DATA:  Syncope EXAM: PORTABLE CHEST 1 VIEW COMPARISON:  April 07, 2019 FINDINGS: A stimulator pack is noted overlying the left upper lung zone. This obscures lung details at this level. Atherosclerotic changes are noted of the thoracic aorta. The heart size is normal. There is a questionable opacity at the left lung base. There are advanced degenerative changes of both glenohumeral joints. IMPRESSION: 1. No definite acute cardiopulmonary process. 2. Questionable opacity at the left lung base may represent atelectasis or infiltrate. A 4-6 week follow-up two-view chest x-ray is recommended. Electronically Signed   By: Constance Holster M.D.   On: 01/18/2020 22:38   ECHOCARDIOGRAM COMPLETE  Result Date: 02/08/2020    ECHOCARDIOGRAM REPORT   Patient Name:   DR. Suzzanne Cloud Date of Exam: 02/07/2020 Medical Rec #:  [id number]           Height:       72.0 in Accession #:    [id number]          Weight:       145.5 lb Date of Birth:  10/14/1940           BSA:          1.861 m Patient Age:    16 years            BP:           137/55 mmHg Patient Gender: M                   HR:           70 bpm. Exam Location:  [facility] Procedure: 2D Echo, Cardiac Doppler and Color Doppler Indications:     R55 Syncope   History:         Patient has no prior history of Echocardiogram examinations.                  Parkinson's Disease.  Sonographer:     Wilford Sports Rodgers-Jones Referring Phys:  [id number] AGBATA Diagnosing Phys: Bartholome Bill MD IMPRESSIONS  1. Left ventricular ejection fraction, by estimation, is 60 to 65%. The left  ventricle has normal function. The left ventricle has no regional wall motion abnormalities. Left ventricular diastolic parameters were normal.  2. Right ventricular systolic function is normal. The right ventricular size is normal. There is normal pulmonary artery systolic pressure.  3. The mitral valve is grossly normal. Trivial mitral valve regurgitation.  4. The aortic valve is grossly normal. Aortic valve regurgitation is trivial. FINDINGS  Left Ventricle: Left ventricular ejection fraction, by estimation, is 60 to 65%. The left ventricle has normal function. The left ventricle has no regional wall motion abnormalities. The left ventricular internal cavity size was normal in size. There is  no left ventricular hypertrophy. Left ventricular diastolic parameters were normal. Right Ventricle: The right ventricular size is normal. No increase in right ventricular wall thickness. Right ventricular systolic function is normal. There is normal pulmonary artery systolic pressure. The tricuspid regurgitant velocity is 2.42 m/s, and  with an assumed right atrial pressure of 10 mmHg, the estimated right ventricular systolic pressure is 89.2 mmHg. Left Atrium: Left atrial size was normal in size. Right Atrium: Right atrial size was normal in size. Pericardium: There is no evidence of pericardial effusion. Mitral Valve: The mitral valve is grossly normal. Trivial mitral valve regurgitation. Tricuspid Valve: The tricuspid valve is grossly normal. Tricuspid valve regurgitation is trivial. Aortic Valve: The aortic valve is grossly normal. Aortic valve regurgitation is trivial. Aortic valve mean gradient measures  6.0 mmHg. Aortic valve peak gradient measures 11.0 mmHg. Aortic valve area, by VTI measures 1.86 cm. Pulmonic Valve: The pulmonic valve was not well visualized. Pulmonic valve regurgitation is trivial. Aorta: The aortic root is normal in size and structure. IAS/Shunts: The interatrial septum was not assessed.  LEFT VENTRICLE PLAX 2D LVIDd:         4.71 cm  Diastology LVIDs:         3.02 cm  LV e' lateral:   9.25 cm/s LV PW:         1.02 cm  LV E/e' lateral: 10.4 LV IVS:        1.01 cm  LV e' medial:    10.00 cm/s LVOT diam:     1.80 cm  LV E/e' medial:  9.6 LV SV:         61 LV SV Index:   33 LVOT Area:     2.54 cm  RIGHT VENTRICLE             IVC RV Basal diam:  3.78 cm     IVC diam: 1.45 cm RV S prime:     17.10 cm/s TAPSE (M-mode): 3.1 cm LEFT ATRIUM             Index       RIGHT ATRIUM           Index LA diam:        4.40 cm 2.36 cm/m  RA Area:     12.80 cm LA Vol (A2C):   38.1 ml 20.47 ml/m RA Volume:   34.40 ml  18.48 ml/m LA Vol (A4C):   49.2 ml 26.44 ml/m LA Biplane Vol: 45.8 ml 24.61 ml/m  AORTIC VALVE AV Area (Vmax):    2.00 cm AV Area (Vmean):   1.95 cm AV Area (VTI):     1.86 cm AV Vmax:           165.50 cm/s AV Vmean:          116.500 cm/s AV VTI:  0.330 m AV Peak Grad:      11.0 mmHg AV Mean Grad:      6.0 mmHg LVOT Vmax:         130.00 cm/s LVOT Vmean:        89.200 cm/s LVOT VTI:          0.241 m LVOT/AV VTI ratio: 0.73  AORTA Ao Root diam: 3.60 cm MITRAL VALVE               TRICUSPID VALVE MV Area (PHT): 4.06 cm    TR Peak grad:   23.4 mmHg MV Decel Time: 187 msec    TR Vmax:        242.00 cm/s MV E velocity: 96.10 cm/s MV A velocity: 74.40 cm/s  SHUNTS MV E/A ratio:  1.29        Systemic VTI:  0.24 m                            Systemic Diam: 1.80 cm Bartholome Bill MD Electronically signed by Bartholome Bill MD Signature Date/Time: 02/08/2020/2:17:27 PM    Final       Subjective: Patient seen and examined at bedside, resting comfortably.  Slightly agitated and wants to  discharge home immediately.  Spouse present at bedside and updated.  Also called patient's daughter who lives in [place] and given her an update this morning.  Echocardiogram reports back with normal LVEF with no valvular abnormalities.  Patient has ambulated with physical therapy with no episodes of recurrent syncope.  No arrhythmias appreciated on telemetry.  Etiology discussed with patient and spouse at bedside likely related to his underlying advanced Parkinson's disease with autonomic dysfunction with neurology's recommendations of TED hose at all times.  Patient will be discharging home with outpatient follow-up with PCP and neurology.  No other complaints or concerns at this time.  Denies headache, no visual changes, no chest pain, no palpitations, no shortness of breath, no abdominal pain.  No concerns per nursing this afternoon.  Discharge Exam: Vitals:   02/08/20 0751 02/08/20 1208  BP: (!) 146/65 (!) 100/53  Pulse: 72 62  Resp: 18 19  Temp: 97.9 F (36.6 C) 98.6 F (37 C)  SpO2: 100% 100%   Vitals:   02/07/20 2300 02/08/20 0420 02/08/20 0751 02/08/20 1208  BP: 140/61 127/63 (!) 146/65 (!) 100/53  Pulse: 70 73 72 62  Resp:   18 19  Temp: 98 F (36.7 C) 98.6 F (37 C) 97.9 F (36.6 C) 98.6 F (37 C)  TempSrc: Oral Oral Oral Oral  SpO2: 100% 97% 100% 100%  Weight:  65.6 kg    Height:        General: Pt is alert, awake, not in acute distress Cardiovascular: RRR, S1/S2 +, no rubs, no gallops Respiratory: CTA bilaterally, no wheezing, no rhonchi Abdominal: Soft, NT, ND, bowel sounds + Extremities: no edema, no cyanosis    The results of significant diagnostics from this hospitalization (including imaging, microbiology, ancillary and laboratory) are listed below for reference.     Microbiology: Recent Results (from the past 240 hour(s))  SARS CORONAVIRUS 2 (TAT 6-24 HRS) Nasopharyngeal Nasopharyngeal Swab     Status: None   Collection Time: 02/07/20  2:46 PM    Specimen: Nasopharyngeal Swab  Result Value Ref Range Status   SARS Coronavirus 2 NEGATIVE NEGATIVE Final    Comment: (NOTE) SARS-CoV-2 target nucleic acids are NOT DETECTED.  The SARS-CoV-2 RNA is  generally detectable in upper and lower respiratory specimens during the acute phase of infection. Negative results do not preclude SARS-CoV-2 infection, do not rule out co-infections with other pathogens, and should not be used as the sole basis for treatment or other patient management decisions. Negative results must be combined with clinical observations, patient history, and epidemiological information. The expected result is Negative.  Fact Sheet for Patients: SugarRoll.be  Fact Sheet for Healthcare Providers: https://www.woods-mathews.com/  This test is not yet approved or cleared by the [place] FDA and  has been authorized for detection and/or diagnosis of SARS-CoV-2 by FDA under an Emergency Use Authorization (EUA). This EUA will remain  in effect (meaning this test can be used) for the duration of the COVID-19 declaration under Se ction 564(b)(1) of the Act, 21 U.S.C. section 360bbb-3(b)(1), unless the authorization is terminated or revoked sooner.  Performed at [facility], [place] [address] [place], [place] [address]   MRSA PCR Screening     Status: None   Collection Time: 02/07/20 11:33 PM   Specimen: Nasopharyngeal  Result Value Ref Range Status   MRSA by PCR NEGATIVE NEGATIVE Final    Comment:        The GeneXpert MRSA Assay (FDA approved for NASAL specimens only), is one component of a comprehensive MRSA colonization surveillance program. It is not intended to diagnose MRSA infection nor to guide or monitor treatment for MRSA infections. Performed at [facility], [address] [place], [place] [address]      Labs: BNP (last 3 results) Recent Labs    01/18/20 2225  BNP 94.1   Basic  Metabolic Panel: Recent Labs  Lab 02/07/20 1209 02/08/20 0429  NA 138 139  K 3.7 3.5  CL 105 107  CO2 25 28  GLUCOSE 109* 98  BUN 21 13  CREATININE 0.65 0.68  CALCIUM 7.6* 8.1*   Liver Function Tests: No results for input(s): AST, ALT, ALKPHOS, BILITOT, PROT, ALBUMIN in the last 168 hours. No results for input(s): LIPASE, AMYLASE in the last 168 hours. No results for input(s): AMMONIA in the last 168 hours. CBC: Recent Labs  Lab 02/07/20 1209 02/08/20 0429  WBC 2.2* 2.6*  NEUTROABS 0.9*  --   HGB 7.6* 8.5*  HCT 21.9* 24.9*  MCV 121.0* 119.7*  PLT 218 245   Cardiac Enzymes: No results for input(s): CKTOTAL, CKMB, CKMBINDEX, TROPONINI in the last 168 hours. BNP: Invalid input(s): POCBNP CBG: No results for input(s): GLUCAP in the last 168 hours. D-Dimer No results for input(s): DDIMER in the last 72 hours. Hgb A1c No results for input(s): HGBA1C in the last 72 hours. Lipid Profile No results for input(s): CHOL, HDL, LDLCALC, TRIG, CHOLHDL, LDLDIRECT in the last 72 hours. Thyroid function studies No results for input(s): TSH, T4TOTAL, T3FREE, THYROIDAB in the last 72 hours.  Invalid input(s): FREET3 Anemia work up Recent Labs    02/07/20 1629  TIBC 218*  IRON 179   Urinalysis    Component Value Date/Time   COLORURINE Amber 08/13/2011 1127   APPEARANCEUR Clear 08/12/2018 1412   LABSPEC 1.027 08/13/2011 1127   PHURINE 5.0 08/13/2011 1127   GLUCOSEU Trace (A) 08/12/2018 1412   GLUCOSEU Negative 08/13/2011 1127   HGBUR Negative 08/13/2011 1127   BILIRUBINUR Negative 08/12/2018 1412   BILIRUBINUR Negative 08/13/2011 1127   KETONESUR Trace 08/13/2011 1127   PROTEINUR 1+ (A) 08/12/2018 1412   PROTEINUR 100 mg/dL 08/13/2011 1127   UROBILINOGEN 0.2 01/21/2018 1049  NITRITE Negative 08/12/2018 1412   NITRITE Negative 08/13/2011 1127   LEUKOCYTESUR Negative 08/12/2018 1412   LEUKOCYTESUR Negative 08/13/2011 1127   Sepsis Labs Invalid input(s):  PROCALCITONIN,  WBC,  LACTICIDVEN Microbiology Recent Results (from the past 240 hour(s))  SARS CORONAVIRUS 2 (TAT 6-24 HRS) Nasopharyngeal Nasopharyngeal Swab     Status: None   Collection Time: 02/07/20  2:46 PM   Specimen: Nasopharyngeal Swab  Result Value Ref Range Status   SARS Coronavirus 2 NEGATIVE NEGATIVE Final    Comment: (NOTE) SARS-CoV-2 target nucleic acids are NOT DETECTED.  The SARS-CoV-2 RNA is generally detectable in upper and lower respiratory specimens during the acute phase of infection. Negative results do not preclude SARS-CoV-2 infection, do not rule out co-infections with other pathogens, and should not be used as the sole basis for treatment or other patient management decisions. Negative results must be combined with clinical observations, patient history, and epidemiological information. The expected result is Negative.  Fact Sheet for Patients: SugarRoll.be  Fact Sheet for Healthcare Providers: https://www.woods-mathews.com/  This test is not yet approved or cleared by the [place] FDA and  has been authorized for detection and/or diagnosis of SARS-CoV-2 by FDA under an Emergency Use Authorization (EUA). This EUA will remain  in effect (meaning this test can be used) for the duration of the COVID-19 declaration under Se ction 564(b)(1) of the Act, 21 U.S.C. section 360bbb-3(b)(1), unless the authorization is terminated or revoked sooner.  Performed at [facility], [place] [address], [place], [place] [address]   MRSA PCR Screening     Status: None   Collection Time: 02/07/20 11:33 PM   Specimen: Nasopharyngeal  Result Value Ref Range Status   MRSA by PCR NEGATIVE NEGATIVE Final    Comment:        The GeneXpert MRSA Assay (FDA approved for NASAL specimens only), is one component of a comprehensive MRSA colonization surveillance program. It is not intended to diagnose MRSA infection nor to guide  or monitor treatment for MRSA infections. Performed at [facility], [address] [place], [place] [address]      Time coordinating discharge: Over 30 minutes  SIGNED:   Eyana Stolze J [place] DO  [facility] 02/08/2020, 3:18 PM

## 2020-02-08 NOTE — TOC Initial Note (Signed)
Transition of Care [facility]) - Initial/Assessment Note    Patient Details  Name: [place] MRN: [id number] Date of Birth: Jun 07, 1941  Transition of Care [facility]) CM/SW Contact:    Shelbie Ammons, RN Phone Number: 02/08/2020, 2:35 PM  Clinical Narrative:   RNCM assessed patient by telephone, patient reports to feeling ok and is very upset that he is still here and wants to go home as soon as possible. Patient is agreeable to home health services and reports that he does not care who the agency is as long as his insurance will pay for it and it doesn't hold him up from leaving the hospital. Patient reports that he has a 2 wheel and 4 wheel walker at home.  RNCM reached out to [facility] with [facility] who will accept referral.               Expected Discharge Plan: [place] Barriers to Discharge: No Barriers Identified   Patient Goals and CMS Choice        Expected Discharge Plan and Services Expected Discharge Plan: [place]   Discharge Planning Services: CM Consult   Living arrangements for the past 2 months: Single Family Home                           HH Arranged: RN, PT, OT, Nurse's Aide, Social Work [employer] Agency: [place] ([place]) Date [place]: 02/08/20 Time HH Agency Contacted: 22 Representative spoke with at [place]: Corene Cornea  Prior Living Arrangements/Services Living arrangements for the past 2 months: [place] with:: Spouse Patient language and need for interpreter reviewed:: Yes Do you feel safe going back to the place where you live?: Yes      Need for Family Participation in Patient Care: Yes (Comment) Care giver support system in place?: Yes (comment)   Criminal Activity/Legal Involvement Pertinent to Current Situation/Hospitalization: No - Comment as needed  Activities of Daily Living Home Assistive Devices/Equipment: Cane (specify quad or straight), Walker (specify type), Hearing  aid, Eyeglasses ADL Screening (condition at time of admission) Patient's cognitive ability adequate to safely complete daily activities?: Yes Is the patient deaf or have difficulty hearing?: No Does the patient have difficulty seeing, even when wearing glasses/contacts?: No Does the patient have difficulty concentrating, remembering, or making decisions?: No Patient able to express need for assistance with ADLs?: Yes Does the patient have difficulty dressing or bathing?: Yes Independently performs ADLs?: Yes (appropriate for developmental age) Does the patient have difficulty walking or climbing stairs?: Yes Weakness of Legs: Both Weakness of Arms/Hands: Both  Permission Sought/Granted                  Emotional Assessment Appearance:: Appears stated age Attitude/Demeanor/Rapport: Guarded Affect (typically observed): Appropriate Orientation: : Oriented to Self, Oriented to Place, Oriented to  Time, Oriented to Situation Alcohol / Substance Use: Not Applicable Psych Involvement: No (comment)  Admission diagnosis:  Syncope due to orthostatic hypotension [I95.1] Syncope, unspecified syncope type [R55] Anemia, unspecified type [D64.9] Patient Active Problem List   Diagnosis Date Noted  . Syncope 02/07/2020  . Anemia 02/07/2020  . Leukopenia 02/07/2020  . Neurogenic urinary incontinence 11/02/2019  . Dementia ([place]) 07/04/2019  . Mood disorder ([place]) 07/04/2019  . Spinal stenosis, lumbar region with neurogenic claudication 04/07/2019  . Irregular heart beat 04/07/2019  . Malnutrition of mild degree ([place]) 04/07/2019  . Urinary frequency 01/21/2018  .  Advanced directives, counseling/discussion 04/20/2014  . Routine general medical examination at a health care facility 03/16/2012  . 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 Osteoarthrosis, hip 08/12/2011  . Parkinson's disease ([place]) 01/29/2011  . Aortic stenosis 01/01/2009  . GILBERT'S SYNDROME 12/09/2007  . GERD  12/09/2007  . RENAL CALCULUS, HX OF 12/09/2007   PCP:  [name], MD Pharmacy:   [place], [place] - [address] [place] [address] Phone: [phone] Fax: [phone]  [place], [place] [address] [place] [address] Phone: [phone] Fax: [phone]  [place] by [place], [place] [address] [place] [address] Phone: [phone] Fax: [phone]     Social Determinants of Health (SDOH) Interventions    Readmission Risk Interventions No flowsheet data found.

## 2020-02-08 NOTE — Telephone Encounter (Signed)
[place] from [employer] for authorization for Palliative Care. I advised her that in the past the pt has declined care in the home. She said the hospital notes do not mention if he had been talked to about this referral prior to discharge. She suggested we discuss this with him at the hospital follow-up that needs to be scheduled for next week. [place] we can fax the order or call it to  [phone] opt 2 if pt agrees.Marland Kitchen

## 2020-02-08 NOTE — Progress Notes (Signed)
OT Cancellation Note  Patient Details Name: [place] MRN: [id number] DOB: 12-Mar-1941   Cancelled Treatment:    Reason Eval/Treat Not Completed: Patient at procedure or test/ unavailable. Order received and chart reviewed. Upon arrival to room, MD in to see pt. Will hold OT evaluation at this time and re-attempt at a later date/time as available and pt medically appropriate.   Shara Blazing, M.S., OTR/L Ascom: [phone] 02/08/20, 1:39 PM

## 2020-02-08 NOTE — Telephone Encounter (Signed)
Teneda @[facility]  called to get a 1 week follow up. Please advise where pt can be added

## 2020-02-09 NOTE — Telephone Encounter (Signed)
Will have him wait until hospital followup

## 2020-02-10 NOTE — Telephone Encounter (Signed)
Can he come in on Monday at [place]? If not, I will add him on at the end of Tuesday morning--if that works for him

## 2020-02-12 ENCOUNTER — Other Ambulatory Visit: Payer: Self-pay

## 2020-02-12 ENCOUNTER — Other Ambulatory Visit: Payer: Self-pay | Admitting: Internal Medicine

## 2020-02-12 ENCOUNTER — Ambulatory Visit (INDEPENDENT_AMBULATORY_CARE_PROVIDER_SITE_OTHER): Payer: Medicare Other | Admitting: Internal Medicine

## 2020-02-12 ENCOUNTER — Encounter: Payer: Self-pay | Admitting: Internal Medicine

## 2020-02-12 ENCOUNTER — Telehealth: Payer: Medicare Other | Admitting: Internal Medicine

## 2020-02-12 VITALS — BP 122/60 | HR 77 | Temp 97.6°F | Ht 72.0 in | Wt 155.0 lb

## 2020-02-12 DIAGNOSIS — R55 Syncope and collapse: Secondary | ICD-10-CM

## 2020-02-12 DIAGNOSIS — G20 Parkinson's disease: Secondary | ICD-10-CM

## 2020-02-12 DIAGNOSIS — E441 Mild protein-calorie malnutrition: Secondary | ICD-10-CM | POA: Diagnosis not present

## 2020-02-12 DIAGNOSIS — D649 Anemia, unspecified: Secondary | ICD-10-CM

## 2020-02-12 DIAGNOSIS — F0280 Dementia in other diseases classified elsewhere without behavioral disturbance: Secondary | ICD-10-CM | POA: Diagnosis not present

## 2020-02-12 NOTE — Assessment & Plan Note (Signed)
Probably multifactorial Likely orthostasis from Parkinson's Ongoing malnutrition Anemia is worse Echo was okay

## 2020-02-12 NOTE — Assessment & Plan Note (Signed)
No sig functional difference

## 2020-02-12 NOTE — Telephone Encounter (Signed)
Patient scheduled appointment on 02/12/20 at 3:00.

## 2020-02-12 NOTE — Progress Notes (Signed)
Subjective:    Patient ID: [place], male    DOB: August 01, 1940, 79 y.o.   MRN: [id number]  HPI Here with wife and daughter, [place] for hospital follow up This visit occurred during the SARS-CoV-2 public health emergency.  Safety protocols were in place, including screening questions prior to the visit, additional usage of staff PPE, and extensive cleaning of exam room while observing appropriate contact time as indicated for disinfecting solutions.   He passed out at home--found by wife in chair but not awake or responding BP was found to be low ?orthostasis due to Parkinson's Had previous spell a few weeks ago Concern about aortic stenosis---but echo doesn't show AS Anemia--goes back a long way--but is worse  No dizziness now He feels the Parkinson's is stable  Daughters are trying to set back up with aides Wife has resisted having others in their home  Current Outpatient Medications on File Prior to Visit  Medication Sig Dispense Refill  . carbidopa-levodopa (SINEMET IR) 25-100 MG tablet TAKE TWO TABLETS AT 7:30AM, ONE AT 10AM,ONE AT 1PM, ONE AT 4PM AND 1 AT 7PM. 180 tablet 0  . magnesium hydroxide (MILK OF MAGNESIA) 400 MG/5ML suspension Take 5 mLs by mouth daily as needed for mild constipation.    . meloxicam (MOBIC) 15 MG tablet Take 15 mg by mouth daily.    . polyethylene glycol (MIRALAX / GLYCOLAX) 17 g packet Take 17 g by mouth daily as needed.      No current facility-administered medications on file prior to visit.    Allergies  Allergen Reactions  . Amoxicillin   . Celebrex [Celecoxib]   . Demerol Other (See Comments)    Hallucinations   . Penicillins     REACTION: RASH    Past Medical History:  Diagnosis Date  . Arthritis    left hip   . GERD (gastroesophageal reflux disease) Pre 2002   Mild  . History of kidney stones   . 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  Parkinson disease ([place]) 2004   Slowly progressive  . Ruptured appendix teens    Past Surgical History:  Procedure Laterality Date  . APPENDECTOMY    . CATARACT EXTRACTION W/ INTRAOCULAR LENS  IMPLANT, BILATERAL  2017  . DEEP BRAIN STIMULATOR PLACEMENT  8/14   L STN  . DOPPLER ECHOCARDIOGRAPHY  01/10/2009   LV NML Mild LVH EF 60-65% aortic sclerosis w/0 stenosis   . JOINT REPLACEMENT     left hip replacement 08/14/11/[place]   . SUBTHALAMIC STIMULATOR BATTERY REPLACEMENT Left 03/25/2017   Procedure: Deep Brain Stimulator battery replacement;  Surgeon: Erline Levine, MD;  Location: [facility];  Service: Neurosurgery;  Laterality: Left;  left  . TONSILLECTOMY    . TOTAL HIP REVISION  08/27/2011   Procedure: TOTAL HIP REVISION;  Surgeon: Mauri Pole, MD;  Location: [facility];  Service: Orthopedics;  Laterality: Left;    Family History  Problem Relation Age of Onset  . Arthritis Mother        knee replacement  . COPD Father        emphysema, smoker  . Heart disease Father        CHF  . Healthy Sister   . Alcohol abuse Paternal Uncle   . Rheum arthritis Daughter     Social History   Socioeconomic History  . Marital status: Married    Spouse name: Not on file  .  Number of children: 2  . Years of education: Not on file  . Highest education level: Doctorate  Occupational History  . Occupation: Optometrist    Comment: medically retired  Tobacco Use  . Smoking status: Never Smoker  . Smokeless tobacco: Never Used  Vaping Use  . Vaping Use: Never used  Substance and Sexual Activity  . Alcohol use: Yes    Alcohol/week: 7.0 standard drinks    Types: 7 Standard drinks or equivalent per week    Comment: occassionally  . Drug use: No  . Sexual activity: Yes  Other Topics Concern  . Not on file  Social History Narrative   Married, lives with wife   2 daughters   Right handed   Has living will   Wife has health care POA---then daughters   Would still accept CPR--but no prolonged  artificial means (ventilator or tube feeds)   Social Determinants of Health   Financial Resource Strain:   . Difficulty of Paying Living Expenses:   Food Insecurity:   . Worried About Charity fundraiser in the Last Year:   . Arboriculturist in the Last Year:   Transportation Needs:   . Film/video editor (Medical):   Marland Kitchen Lack of Transportation (Non-Medical):   Physical Activity:   . Days of Exercise per Week:   . Minutes of Exercise per Session:   Stress:   . Feeling of Stress :   Social Connections:   . Frequency of Communication with Friends and Family:   . Frequency of Social Gatherings with Friends and Family:   . Attends Religious Services:   . Active Member of Clubs or Organizations:   . Attends Archivist Meetings:   Marland Kitchen Marital Status:   Intimate Partner Violence:   . Fear of Current or Ex-Partner:   . Emotionally Abused:   Marland Kitchen Physically Abused:   . Sexually Abused:    Review of Systems  Eats okay if they have food Sleeping okay--more lately No chest pain No palpitations    Objective:   Physical Exam Constitutional:      Comments: Some wasting  Cardiovascular:     Rate and Rhythm: Normal rate and regular rhythm.     Heart sounds: No murmur heard.  No gallop.   Pulmonary:     Effort: Pulmonary effort is normal.     Breath sounds: Normal breath sounds. No wheezing or rales.  Abdominal:     Palpations: Abdomen is soft.     Tenderness: There is no abdominal tenderness.  Musculoskeletal:     Right lower leg: No edema.     Left lower leg: No edema.  Lymphadenopathy:     Cervical: No cervical adenopathy.  Skin:    Findings: No rash.  Neurological:     Comments: Shuffling gait and bradykinesia  Psychiatric:        Mood and Affect: Mood normal.            Assessment & Plan:

## 2020-02-12 NOTE — Assessment & Plan Note (Signed)
Weight up some--but they are not eating right and he has lost weight overall They need help at home--and have been reluctant

## 2020-02-12 NOTE — Assessment & Plan Note (Signed)
Not iron deficient Needs hematology evaluation---?myelodysplasia?

## 2020-02-12 NOTE — Assessment & Plan Note (Signed)
Mild and likely related to the Parkinsons  Had palliative consult in hospital--will continue this

## 2020-02-14 ENCOUNTER — Telehealth: Payer: Self-pay | Admitting: Internal Medicine

## 2020-02-14 DIAGNOSIS — D72819 Decreased white blood cell count, unspecified: Secondary | ICD-10-CM | POA: Diagnosis not present

## 2020-02-14 DIAGNOSIS — Z9181 History of falling: Secondary | ICD-10-CM | POA: Diagnosis not present

## 2020-02-14 DIAGNOSIS — K219 Gastro-esophageal reflux disease without esophagitis: Secondary | ICD-10-CM | POA: Diagnosis not present

## 2020-02-14 DIAGNOSIS — Z9049 Acquired absence of other specified parts of digestive tract: Secondary | ICD-10-CM | POA: Diagnosis not present

## 2020-02-14 DIAGNOSIS — M1612 Unilateral primary osteoarthritis, left hip: Secondary | ICD-10-CM | POA: Diagnosis not present

## 2020-02-14 DIAGNOSIS — Z9682 Presence of neurostimulator: Secondary | ICD-10-CM | POA: Diagnosis not present

## 2020-02-14 DIAGNOSIS — Z87442 Personal history of urinary calculi: Secondary | ICD-10-CM | POA: Diagnosis not present

## 2020-02-14 DIAGNOSIS — I350 Nonrheumatic aortic (valve) stenosis: Secondary | ICD-10-CM | POA: Diagnosis not present

## 2020-02-14 DIAGNOSIS — D638 Anemia in other chronic diseases classified elsewhere: Secondary | ICD-10-CM | POA: Diagnosis not present

## 2020-02-14 DIAGNOSIS — G20 Parkinson's disease: Secondary | ICD-10-CM | POA: Diagnosis not present

## 2020-02-14 DIAGNOSIS — Z9089 Acquired absence of other organs: Secondary | ICD-10-CM | POA: Diagnosis not present

## 2020-02-14 DIAGNOSIS — F0280 Dementia in other diseases classified elsewhere without behavioral disturbance: Secondary | ICD-10-CM | POA: Diagnosis not present

## 2020-02-14 NOTE — Telephone Encounter (Signed)
Home Health Verbal Orders - Caller/Agency: [facility] Number: [phone]   Requesting OT/PT/Skilled Nursing/Social Work/Speech Therapy: PT and speech therapy request Frequency: 2 week 6, 1 week 3.

## 2020-02-15 NOTE — Telephone Encounter (Signed)
That would be great.  Thanks 

## 2020-02-15 NOTE — Telephone Encounter (Signed)
Verbal orders given to Stacy 

## 2020-02-16 ENCOUNTER — Telehealth: Payer: Self-pay | Admitting: Neurology

## 2020-02-16 DIAGNOSIS — D638 Anemia in other chronic diseases classified elsewhere: Secondary | ICD-10-CM | POA: Diagnosis not present

## 2020-02-16 DIAGNOSIS — I350 Nonrheumatic aortic (valve) stenosis: Secondary | ICD-10-CM | POA: Diagnosis not present

## 2020-02-16 DIAGNOSIS — M1612 Unilateral primary osteoarthritis, left hip: Secondary | ICD-10-CM | POA: Diagnosis not present

## 2020-02-16 DIAGNOSIS — F0280 Dementia in other diseases classified elsewhere without behavioral disturbance: Secondary | ICD-10-CM | POA: Diagnosis not present

## 2020-02-16 DIAGNOSIS — K219 Gastro-esophageal reflux disease without esophagitis: Secondary | ICD-10-CM | POA: Diagnosis not present

## 2020-02-16 DIAGNOSIS — G20 Parkinson's disease: Secondary | ICD-10-CM | POA: Diagnosis not present

## 2020-02-16 NOTE — Telephone Encounter (Signed)
[facility] neurology is the only group there.  He may have started there?  Its Dr. Melrose Nakayama and Dr. Manuella Ghazi.  Let us know if he needs referral.

## 2020-02-16 NOTE — Telephone Encounter (Signed)
Spoke with pt wife informed her that [facility] neurology is the only group there.  He may have started there?  Its Dr. Melrose Nakayama and Dr. Manuella Ghazi.  Let us know if he needs referral. Pt wife stated that they will call us back to let us know if they need a referral.

## 2020-02-16 NOTE — Telephone Encounter (Signed)
Patient is considering transferring care closer to home in [place] and is asking if Dr. Carles Collet knows of anyone she would recommend. Please call.

## 2020-02-17 DIAGNOSIS — M1612 Unilateral primary osteoarthritis, left hip: Secondary | ICD-10-CM | POA: Diagnosis not present

## 2020-02-17 DIAGNOSIS — F0280 Dementia in other diseases classified elsewhere without behavioral disturbance: Secondary | ICD-10-CM | POA: Diagnosis not present

## 2020-02-17 DIAGNOSIS — D638 Anemia in other chronic diseases classified elsewhere: Secondary | ICD-10-CM | POA: Diagnosis not present

## 2020-02-17 DIAGNOSIS — G20 Parkinson's disease: Secondary | ICD-10-CM | POA: Diagnosis not present

## 2020-02-17 DIAGNOSIS — K219 Gastro-esophageal reflux disease without esophagitis: Secondary | ICD-10-CM | POA: Diagnosis not present

## 2020-02-17 DIAGNOSIS — I350 Nonrheumatic aortic (valve) stenosis: Secondary | ICD-10-CM | POA: Diagnosis not present

## 2020-02-19 ENCOUNTER — Other Ambulatory Visit: Payer: Self-pay

## 2020-02-19 DIAGNOSIS — F0280 Dementia in other diseases classified elsewhere without behavioral disturbance: Secondary | ICD-10-CM | POA: Diagnosis not present

## 2020-02-19 DIAGNOSIS — M1612 Unilateral primary osteoarthritis, left hip: Secondary | ICD-10-CM | POA: Diagnosis not present

## 2020-02-19 DIAGNOSIS — D638 Anemia in other chronic diseases classified elsewhere: Secondary | ICD-10-CM | POA: Diagnosis not present

## 2020-02-19 DIAGNOSIS — I350 Nonrheumatic aortic (valve) stenosis: Secondary | ICD-10-CM | POA: Diagnosis not present

## 2020-02-19 DIAGNOSIS — K219 Gastro-esophageal reflux disease without esophagitis: Secondary | ICD-10-CM | POA: Diagnosis not present

## 2020-02-19 DIAGNOSIS — G20 Parkinson's disease: Secondary | ICD-10-CM | POA: Diagnosis not present

## 2020-02-19 MED ORDER — CARBIDOPA-LEVODOPA 25-100 MG PO TABS: ORAL_TABLET | ORAL | 0 refills | Status: DC

## 2020-02-19 NOTE — Telephone Encounter (Signed)
Rx(s) sent to pharmacy electronically.  

## 2020-02-20 ENCOUNTER — Telehealth: Payer: Self-pay

## 2020-02-20 NOTE — Telephone Encounter (Signed)
That is fine 

## 2020-02-20 NOTE — Telephone Encounter (Signed)
Verbal orders given to Lynn 

## 2020-02-20 NOTE — Telephone Encounter (Signed)
[place] w/ [facility] req VO for HH OT   1x a week for 1 week then 2x a week for 3 weeks  Please advise

## 2020-02-21 ENCOUNTER — Other Ambulatory Visit: Payer: Self-pay

## 2020-02-21 ENCOUNTER — Encounter: Payer: Self-pay | Admitting: Oncology

## 2020-02-21 ENCOUNTER — Inpatient Hospital Stay: Payer: Medicare Other

## 2020-02-21 ENCOUNTER — Inpatient Hospital Stay: Payer: Medicare Other | Attending: Oncology | Admitting: Oncology

## 2020-02-21 VITALS — BP 99/51 | HR 57 | Temp 96.5°F | Wt 143.4 lb

## 2020-02-21 DIAGNOSIS — D72819 Decreased white blood cell count, unspecified: Secondary | ICD-10-CM | POA: Diagnosis not present

## 2020-02-21 DIAGNOSIS — D709 Neutropenia, unspecified: Secondary | ICD-10-CM | POA: Diagnosis not present

## 2020-02-21 DIAGNOSIS — D649 Anemia, unspecified: Secondary | ICD-10-CM | POA: Diagnosis not present

## 2020-02-21 DIAGNOSIS — D539 Nutritional anemia, unspecified: Secondary | ICD-10-CM

## 2020-02-21 DIAGNOSIS — G20 Parkinson's disease: Secondary | ICD-10-CM | POA: Insufficient documentation

## 2020-02-21 DIAGNOSIS — D708 Other neutropenia: Secondary | ICD-10-CM

## 2020-02-21 LAB — CBC WITH DIFFERENTIAL/PLATELET
Abs Immature Granulocytes: 0.02 10*3/uL (ref 0.00–0.07)
Basophils Absolute: 0.1 10*3/uL (ref 0.0–0.1)
Basophils Relative: 1 %
Eosinophils Absolute: 0.2 10*3/uL (ref 0.0–0.5)
Eosinophils Relative: 5 %
HCT: 27 % — ABNORMAL LOW (ref 39.0–52.0)
Hemoglobin: 9.1 g/dL — ABNORMAL LOW (ref 13.0–17.0)
Immature Granulocytes: 1 %
Lymphocytes Relative: 22 %
Lymphs Abs: 0.9 10*3/uL (ref 0.7–4.0)
MCH: 40.4 pg — ABNORMAL HIGH (ref 26.0–34.0)
MCHC: 33.7 g/dL (ref 30.0–36.0)
MCV: 120 fL — ABNORMAL HIGH (ref 80.0–100.0)
Monocytes Absolute: 0.6 10*3/uL (ref 0.1–1.0)
Monocytes Relative: 15 %
Neutro Abs: 2.1 10*3/uL (ref 1.7–7.7)
Neutrophils Relative %: 56 %
Platelets: 321 10*3/uL (ref 150–400)
RBC: 2.25 MIL/uL — ABNORMAL LOW (ref 4.22–5.81)
RDW: 13.8 % (ref 11.5–15.5)
WBC: 3.8 10*3/uL — ABNORMAL LOW (ref 4.0–10.5)
nRBC: 0 % (ref 0.0–0.2)

## 2020-02-21 LAB — COMPREHENSIVE METABOLIC PANEL
ALT: 5 U/L (ref 0–44)
AST: 14 U/L — ABNORMAL LOW (ref 15–41)
Albumin: 3.9 g/dL (ref 3.5–5.0)
Alkaline Phosphatase: 64 U/L (ref 38–126)
Anion gap: 6 (ref 5–15)
BUN: 21 mg/dL (ref 8–23)
CO2: 29 mmol/L (ref 22–32)
Calcium: 8.5 mg/dL — ABNORMAL LOW (ref 8.9–10.3)
Chloride: 102 mmol/L (ref 98–111)
Creatinine, Ser: 0.73 mg/dL (ref 0.61–1.24)
GFR calc Af Amer: >60 mL/min (ref >60)
GFR calc non Af Amer: >60 mL/min (ref >60)
Glucose, Bld: 101 mg/dL — ABNORMAL HIGH (ref 70–99)
Potassium: 4.3 mmol/L (ref 3.5–5.1)
Sodium: 137 mmol/L (ref 135–145)
Total Bilirubin: 2 mg/dL — ABNORMAL HIGH (ref 0.3–1.2)
Total Protein: 6.7 g/dL (ref 6.5–8.1)

## 2020-02-21 LAB — HEPATITIS PANEL, ACUTE
HCV Ab: NONREACTIVE
Hep A IgM: NONREACTIVE
Hep B C IgM: NONREACTIVE
Hepatitis B Surface Ag: NONREACTIVE

## 2020-02-21 LAB — RETIC PANEL
Immature Retic Fract: 17.2 % — ABNORMAL HIGH (ref 2.3–15.9)
RBC.: 2.23 MIL/uL — ABNORMAL LOW (ref 4.22–5.81)
Retic Count, Absolute: 68.2 10*3/uL (ref 19.0–186.0)
Retic Ct Pct: 3.1 % (ref 0.4–3.1)
Reticulocyte Hemoglobin: 40.3 pg (ref >27.9)

## 2020-02-21 LAB — FOLATE: Folate: 11.4 ng/mL (ref >5.9)

## 2020-02-21 LAB — HIV ANTIBODY (ROUTINE TESTING W REFLEX): HIV Screen 4th Generation wRfx: NONREACTIVE

## 2020-02-21 LAB — IRON AND TIBC
Iron: 173 ug/dL (ref 45–182)
Saturation Ratios: 69 % — ABNORMAL HIGH (ref 17.9–39.5)
TIBC: 252 ug/dL (ref 250–450)
UIBC: 79 ug/dL

## 2020-02-21 LAB — LACTATE DEHYDROGENASE: LDH: 91 U/L — ABNORMAL LOW (ref 98–192)

## 2020-02-21 LAB — VITAMIN B12: Vitamin B-12: 546 pg/mL (ref 180–914)

## 2020-02-21 LAB — PATHOLOGIST SMEAR REVIEW

## 2020-02-21 LAB — FERRITIN: Ferritin: 283 ng/mL (ref 24–336)

## 2020-02-21 LAB — TSH: TSH: 2.682 u[IU]/mL (ref 0.350–4.500)

## 2020-02-21 MED ORDER — VITAMIN B-12 1000 MCG PO TABS: 1000.0000 ug | ORAL_TABLET | Freq: Every day | ORAL | 0 refills | Status: DC

## 2020-02-21 NOTE — Progress Notes (Signed)
Hematology/Oncology Consult note [facility] Telephone:([phone] [phone]   Patient Care Team: [name], MD as PCP - General (Pediatrics) Tat, Eustace Quail, DO as Consulting Physician (Neurology)  REFERRING PROVIDER: [name], MD  CHIEF COMPLAINTS/REASON FOR VISIT:  Evaluation of anemia and leukopenia  HISTORY OF PRESENTING ILLNESS:   Casey Tucker is a  79 y.o.  male with PMH listed below was seen in consultation at the request of  [name], MD  for evaluation of anemia and leukopenia  Patient was accompanied by wife today.  Patient's daughter Casey Tucker was called per patient's request. 02/08/2020, patient had a CBC showed WBC 2.6, hemoglobin 8.5, MCV 119, platelet count 245. 02/07/2020, hemoglobin 7.6, MCV 121, differential showed neutropenia with ANC 0.9.  Patient reports 20 pound loss in the past 6 to 12 months.  Fatigue and weakness.  Appetite is fair. Wife reports that patient takes naps during the day.  No fever, chills, night sweating. Chronic history of Parkinson's disease on Carbidopa-levodopa, gabapentin for chronic pain. Patient drinks wine occasionally.  Denies smoking history.  Review of Systems  Constitutional: Positive for fatigue and unexpected weight change. Negative for chills and fever.  HENT:   Negative for hearing loss and voice change.   Eyes: Negative for eye problems and icterus.  Respiratory: Negative for chest tightness, cough and shortness of breath.   Cardiovascular: Negative for chest pain and leg swelling.  Gastrointestinal: Negative for abdominal distention and abdominal pain.  Endocrine: Negative for hot flashes.  Genitourinary: Negative for difficulty urinating, dysuria and frequency.   Musculoskeletal: Negative for arthralgias.  Skin: Negative for itching and rash.  Neurological: Negative for light-headedness and numbness.  Hematological: Negative for adenopathy. Does not bruise/bleed  easily.  Psychiatric/Behavioral: Negative for confusion.    MEDICAL HISTORY:  Past Medical History:  Diagnosis Date  . Arthritis    left hip   . GERD (gastroesophageal reflux disease) Pre 2002   Mild  . History of kidney stones   . 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 Parkinson disease ([place]) 2004   Slowly progressive  . Ruptured appendix teens    SURGICAL HISTORY: Past Surgical History:  Procedure Laterality Date  . APPENDECTOMY    . CATARACT EXTRACTION W/ INTRAOCULAR LENS  IMPLANT, BILATERAL  2017  . DEEP BRAIN STIMULATOR PLACEMENT  8/14   L STN  . DOPPLER ECHOCARDIOGRAPHY  01/10/2009   LV NML Mild LVH EF 60-65% aortic sclerosis w/0 stenosis   . JOINT REPLACEMENT     left hip replacement 08/14/11/[place]   . SUBTHALAMIC STIMULATOR BATTERY REPLACEMENT Left 03/25/2017   Procedure: Deep Brain Stimulator battery replacement;  Surgeon: Erline Levine, MD;  Location: [place];  Service: Neurosurgery;  Laterality: Left;  left  . TONSILLECTOMY    . TOTAL HIP REVISION  08/27/2011   Procedure: TOTAL HIP REVISION;  Surgeon: Mauri Pole, MD;  Location: [facility];  Service: Orthopedics;  Laterality: Left;    SOCIAL HISTORY: Social History   Socioeconomic History  . Marital status: Married    Spouse name: Not on file  . Number of children: 2  . Years of education: Not on file  . Highest education level: Doctorate  Occupational History  . Occupation: Optometrist    Comment: medically retired  Tobacco Use  . Smoking status: Never Smoker  . Smokeless tobacco: Never Used  Vaping Use  . Vaping Use: Never  used  Substance and Sexual Activity  . Alcohol use: Yes    Alcohol/week: 7.0 standard drinks    Types: 7 Standard drinks or equivalent per week    Comment: occassionally  . Drug use: No  . Sexual activity: Yes  Other Topics Concern  . Not on file  Social History Narrative   Married, lives with wife    2 daughters   Right handed   Has living will   Wife has health care POA---then daughters   Would still accept CPR--but no prolonged artificial means (ventilator or tube feeds)   Social Determinants of Health   Financial Resource Strain:   . Difficulty of Paying Living Expenses:   Food Insecurity:   . Worried About Charity fundraiser in the Last Year:   . Arboriculturist in the Last Year:   Transportation Needs:   . Film/video editor (Medical):   Marland Kitchen Lack of Transportation (Non-Medical):   Physical Activity:   . Days of Exercise per Week:   . Minutes of Exercise per Session:   Stress:   . Feeling of Stress :   Social Connections:   . Frequency of Communication with Friends and Family:   . Frequency of Social Gatherings with Friends and Family:   . Attends Religious Services:   . Active Member of Clubs or Organizations:   . Attends Archivist Meetings:   Marland Kitchen Marital Status:   Intimate Partner Violence:   . Fear of Current or Ex-Partner:   . Emotionally Abused:   Marland Kitchen Physically Abused:   . Sexually Abused:     FAMILY HISTORY: Family History  Problem Relation Age of Onset  . Arthritis Mother        knee replacement  . COPD Father        emphysema, smoker  . Heart disease Father        CHF  . Healthy Sister   . Alcohol abuse Paternal Uncle   . Rheum arthritis Daughter     ALLERGIES:  is allergic to amoxicillin, celebrex [celecoxib], demerol, and penicillins.  MEDICATIONS:  Current Outpatient Medications  Medication Sig Dispense Refill  . carbidopa-levodopa (SINEMET IR) 25-100 MG tablet TAKE TWO TABLETS AT 7:30AM, ONE AT 10AM,ONE AT 1PM, ONE AT 4PM AND 1 AT 7PM. 540 tablet 0  . gabapentin (NEURONTIN) 100 MG capsule Take 1 capsule (100 mg total) by mouth 3 (three) times daily. And 211m at bedtime (2 capsules) 150 capsule 5  . magnesium hydroxide (MILK OF MAGNESIA) 400 MG/5ML suspension Take 5 mLs by mouth daily as needed for mild constipation.    .  meloxicam (MOBIC) 15 MG tablet Take 15 mg by mouth daily.    . polyethylene glycol (MIRALAX / GLYCOLAX) 17 g packet Take 17 g by mouth daily as needed.      No current facility-administered medications for this visit.     PHYSICAL EXAMINATION: ECOG PERFORMANCE STATUS: 2 - Symptomatic, <50% confined to bed Vitals:   02/21/20 1105 02/21/20 1106  BP: (!) 96/50 (!) 99/51  Pulse: (!) 57 (!) 57  Temp: (!) 96.5 F (35.8 C)   SpO2: 97%    Filed Weights   02/21/20 1105  Weight: 143 lb 6.4 oz (65 kg)    Physical Exam Constitutional:      General: He is not in acute distress.    Comments: Patient walks with assistance.  Thin built  HENT:     Head: Normocephalic and atraumatic.  Eyes:  General: No scleral icterus. Cardiovascular:     Rate and Rhythm: Normal rate and regular rhythm.     Heart sounds: Normal heart sounds.  Pulmonary:     Effort: Pulmonary effort is normal. No respiratory distress.     Breath sounds: No wheezing.  Abdominal:     General: Bowel sounds are normal. There is no distension.     Palpations: Abdomen is soft.  Musculoskeletal:        General: No deformity. Normal range of motion.     Cervical back: Normal range of motion and neck supple.  Skin:    General: Skin is warm and dry.     Findings: No erythema or rash.  Neurological:     Mental Status: He is alert and oriented to person, place, and time. Mental status is at baseline.     Cranial Nerves: No cranial nerve deficit.     Coordination: Coordination normal.  Psychiatric:        Mood and Affect: Mood normal.     LABORATORY DATA:  I have reviewed the data as listed Lab Results  Component Value Date   WBC 3.8 (L) 02/21/2020   HGB 9.1 (L) 02/21/2020   HCT 27.0 (L) 02/21/2020   MCV 120.0 (H) 02/21/2020   PLT 321 02/21/2020   Recent Labs    04/07/19 1220 04/07/19 1220 01/18/20 2225 01/18/20 2225 02/07/20 1209 02/08/20 0429 02/21/20 1204  NA 138   < > 138   < > 138 139 137  K 4.4   <  > 3.9   < > 3.7 3.5 4.3  CL 102   < > 105   < > 105 107 102  CO2 30   < > 29   < > _0 GLUCOSE 82   < > 147*   < > 109* 98 101*  BUN 17   < > 22   < > _1 CREATININE 0.96   < > 0.71   < > 0.65 0.68 0.73  CALCIUM 9.4   < > 7.6*   < > 7.6* 8.1* 8.5*  GFRNONAA  --   --  >60   < > >60 >60 >60  GFRAA  --   --  >60   < > >60 >60 >60  PROT 6.2  --  5.5*  --   --   --  6.7  ALBUMIN 4.1  --  3.2*  --   --   --  3.9  AST 10  --  9*  --   --   --  14*  ALT 4  --  <5  --   --   --  5  ALKPHOS 65  --  42  --   --   --  64  BILITOT 2.1*  --  1.4*  --   --   --  2.0*   < > = values in this interval not displayed.   Iron/TIBC/Ferritin/ %Sat    Component Value Date/Time   IRON 173 02/21/2020 1204   TIBC 252 02/21/2020 1204   FERRITIN 283 02/21/2020 1204   IRONPCTSAT 69 (H) 02/21/2020 1204      RADIOGRAPHIC STUDIES: I have personally reviewed the radiological images as listed and agreed with the findings in the report. CT Head Wo Contrast  Result Date: 02/07/2020 CLINICAL DATA:  Mental status change, unknown cause. Additional history provided: Syncopal episode while sitting in chair. EXAM: CT HEAD WITHOUT CONTRAST TECHNIQUE:  Contiguous axial images were obtained from the base of the skull through the vertex without intravenous contrast. COMPARISON:  Head CT 01/18/2020 FINDINGS: Brain: Unchanged position of a left frontal approach deep brain stimulator lead. Cerebral volume is normal for age. There is no acute intracranial hemorrhage. No demarcated cortical infarct. No extra-axial fluid collection. No evidence of intracranial mass. No midline shift. Vascular: No hyperdense vessel. Skull: Normal. Negative for fracture or focal lesion. Sinuses/Orbits: Visualized orbits show no acute finding. No significant paranasal sinus disease or mastoid effusion at the imaged levels. IMPRESSION: Stable non-contrast CT appearance of the brain as compared to 01/18/2020. No evidence of acute intracranial  abnormality. 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 Exam Location:  [facility] Procedure: 2D Echo, Cardiac Doppler and Color Doppler Indications:     R55 Syncope  History:         Patient has no prior history of Echocardiogram examinations.                  Parkinson's Disease.  Sonographer:     Wilford Sports Rodgers-Jones Referring Phys:  [id number] EYCXKGYJ AGBATA Diagnosing Phys: Bartholome Bill MD IMPRESSIONS  1. Left ventricular ejection fraction, by estimation, is 60 to 65%. The left ventricle has normal function. The left ventricle has no regional wall motion abnormalities. Left ventricular diastolic parameters were normal.  2. Right ventricular systolic function is normal. The right ventricular size is normal. There is normal pulmonary artery systolic pressure.  3. The mitral valve is grossly normal. Trivial mitral valve regurgitation.  4. The aortic valve is grossly normal. Aortic valve regurgitation is trivial. FINDINGS  Left Ventricle: Left ventricular ejection fraction, by estimation, is 60 to 65%. The left ventricle has normal function. The left ventricle has no regional wall motion abnormalities. The left ventricular internal cavity size was normal in size. There is  no left ventricular hypertrophy. Left ventricular diastolic parameters were normal. Right Ventricle: The right ventricular size is normal. No increase in right ventricular wall thickness. Right ventricular systolic function is normal. There is normal pulmonary artery systolic pressure. The tricuspid  regurgitant velocity is 2.42 m/s, and  with an assumed right atrial pressure of 10 mmHg, the estimated right ventricular systolic pressure is 85.6 mmHg. Left Atrium: Left atrial size was normal in size. Right Atrium: Right atrial size was normal in size. Pericardium: There is no evidence of pericardial effusion. Mitral Valve: The mitral valve is grossly normal. Trivial mitral valve regurgitation. Tricuspid Valve: The tricuspid valve  is grossly normal. Tricuspid valve regurgitation is trivial. Aortic Valve: The aortic valve is grossly normal. Aortic valve regurgitation is trivial. Aortic valve mean gradient measures 6.0 mmHg. Aortic valve peak gradient measures 11.0 mmHg. Aortic valve area, by VTI measures 1.86 cm. Pulmonic Valve: The pulmonic valve was not well visualized. Pulmonic valve regurgitation is trivial. Aorta: The aortic root is normal in size and structure. IAS/Shunts: The interatrial septum was not assessed.  LEFT VENTRICLE PLAX 2D LVIDd:         4.71 cm  Diastology LVIDs:         3.02 cm  LV e' lateral:   9.25 cm/s LV PW:         1.02 cm  LV E/e' lateral: 10.4 LV IVS:        1.01 cm  LV e' medial:    10.00 cm/s LVOT diam:     1.80 cm  LV E/e' medial:  9.6 LV SV:         61 LV SV Index:   33 LVOT Area:     2.54 cm  RIGHT VENTRICLE             IVC RV Basal diam:  3.78 cm     IVC diam: 1.45 cm RV S prime:     17.10 cm/s TAPSE (M-mode): 3.1 cm LEFT ATRIUM             Index       RIGHT ATRIUM           Index LA diam:        4.40 cm 2.36 cm/m  RA Area:     12.80 cm LA Vol (A2C):   38.1 ml 20.47 ml/m RA Volume:   34.40 ml  18.48 ml/m LA Vol (A4C):   49.2 ml 26.44 ml/m LA Biplane Vol: 45.8 ml 24.61 ml/m  AORTIC VALVE AV Area (Vmax):    2.00 cm AV Area (Vmean):   1.95 cm AV Area (VTI):     1.86 cm AV Vmax:           165.50 cm/s AV Vmean:          116.500 cm/s AV VTI:            0.330 m AV Peak Grad:      11.0 mmHg AV Mean Grad:      6.0 mmHg LVOT Vmax:         130.00 cm/s LVOT Vmean:        89.200  cm/s LVOT VTI:          0.241 m LVOT/AV VTI ratio: 0.73  AORTA Ao Root diam: 3.60 cm MITRAL VALVE               TRICUSPID VALVE MV Area (PHT): 4.06 cm    TR Peak grad:   23.4 mmHg MV Decel Time: 187 msec    TR Vmax:        242.00 cm/s MV E velocity: 96.10 cm/s MV A velocity: 74.40 cm/s  SHUNTS MV E/A ratio:  1.29        Systemic VTI:  0.24 m                            Systemic Diam: 1.80 cm Bartholome Bill MD Electronically signed by Bartholome Bill MD Signature Date/Time: 02/08/2020/2:17:27 PM    Final       ASSESSMENT & PLAN:  1. Other neutropenia ([place])  2. Macrocytic anemia    #Chronic macrocytic anemia, reviewed previous blood records.  Patient has been anemia since 2018,  Chronically macrocytic, with MCV progressively increasing since 2009. Check CBC, CMP, pathology smear, vitamin B12, folate, iron, TIBC, ferritin, multiple myeloma panel, LDH, TSH.  #Neutropenia, check flow cytometry, smear, hepatitis, HIV Further management pending above work-up. We discussed about the possibility of bone marrow biopsy for further evaluation.  Smear showed granulocyte hypersegmentation, possible due to nutritional deficiencies.  Vitamin B12 and folate levels are within normal limits.  I will start patient on oral vitamin B12 supplementation while waiting for the rest of the work-up to result.   Orders Placed This Encounter  Procedures  . Folate    Standing Status:   Future    Number of Occurrences:   1    Standing Expiration Date:   02/20/2021  . Vitamin B12    Standing Status:   Future    Number of Occurrences:   1    Standing Expiration Date:   02/20/2021  . Ferritin    Standing Status:   Future    Number of Occurrences:   1    Standing Expiration Date:   08/23/2020  . Iron and TIBC    Standing Status:   Future    Number of Occurrences:   1    Standing Expiration Date:   02/20/2021  . Comprehensive metabolic panel    Standing Status:   Future    Number of Occurrences:   1    Standing Expiration  Date:   02/20/2021  . CBC with Differential/Platelet    Standing Status:   Future    Number of Occurrences:   1    Standing Expiration Date:   02/20/2021  . TSH    Standing Status:   Future    Number of Occurrences:   1    Standing Expiration Date:   02/20/2021  . Multiple Myeloma Panel (SPEP&IFE w/QIG)    Standing Status:   Future    Number of Occurrences:   1    Standing Expiration Date:   02/20/2021  . Kappa/lambda light chains    Standing Status:   Future    Number of Occurrences:   1    Standing Expiration Date:   02/20/2021  . Retic Panel    Standing Status:   Future    Number of Occurrences:   1    Standing Expiration Date:   02/20/2021  . Flow cytometry panel-leukemia/lymphoma work-up    Standing Status:   Future    Number of Occurrences:   1    Standing Expiration Date:   02/20/2021  . Hepatitis panel, acute    Standing Status:   Future    Number of Occurrences:   1    Standing Expiration Date:   02/20/2021  . HIV Antibody (routine testing w rflx)    Standing Status:   Future    Number of Occurrences:   1    Standing Expiration Date:   02/20/2021  . Lactate dehydrogenase    Standing Status:   Future    Number of Occurrences:   1    Standing Expiration Date:   02/20/2021  . Pathologist smear review    Standing Status:   Future    Number of Occurrences:   1    Standing Expiration Date:   02/20/2021    All questions were answered. The patient knows to call the clinic with any problems questions or  concerns.  cc [name], MD    Return of visit: 2 weeks Thank you for this kind referral and the opportunity to participate in the care of this patient. A copy of today's note is routed to referring provider    Earlie Server, MD, PhD Hematology Oncology [facility] Pager- [phone] 02/21/2020

## 2020-02-21 NOTE — Progress Notes (Signed)
Pt here to establish care for anemia.  °

## 2020-02-22 ENCOUNTER — Telehealth: Payer: Self-pay

## 2020-02-22 DIAGNOSIS — D638 Anemia in other chronic diseases classified elsewhere: Secondary | ICD-10-CM | POA: Diagnosis not present

## 2020-02-22 DIAGNOSIS — K219 Gastro-esophageal reflux disease without esophagitis: Secondary | ICD-10-CM | POA: Diagnosis not present

## 2020-02-22 DIAGNOSIS — F0280 Dementia in other diseases classified elsewhere without behavioral disturbance: Secondary | ICD-10-CM | POA: Diagnosis not present

## 2020-02-22 DIAGNOSIS — G20 Parkinson's disease: Secondary | ICD-10-CM | POA: Diagnosis not present

## 2020-02-22 DIAGNOSIS — I350 Nonrheumatic aortic (valve) stenosis: Secondary | ICD-10-CM | POA: Diagnosis not present

## 2020-02-22 DIAGNOSIS — M1612 Unilateral primary osteoarthritis, left hip: Secondary | ICD-10-CM | POA: Diagnosis not present

## 2020-02-22 LAB — KAPPA/LAMBDA LIGHT CHAINS
Kappa free light chain: 43.9 mg/L — ABNORMAL HIGH (ref 3.3–19.4)
Kappa, lambda light chain ratio: 1.84 — ABNORMAL HIGH (ref 0.26–1.65)
Lambda free light chains: 23.9 mg/L (ref 5.7–26.3)

## 2020-02-22 NOTE — Telephone Encounter (Signed)
Unable to add direct bilirubin. MD aware.   Pt's wife, [name], notified of prescription sent to [facility].

## 2020-02-22 NOTE — Telephone Encounter (Signed)
-----   Message from Earlie Server, MD sent at 02/21/2020  8:37 PM EDT ----- Please add direct bilirubin.  Ordered. Please let patient know that I recommend patient to start oral vitamin B12 supplementation 1000 MCG daily while waiting for his work-up to result.  Prescription was sent to his pharmacy.

## 2020-02-22 NOTE — Telephone Encounter (Signed)
Casey Tucker with [facility] requesting orders for speech therapy, pt declined previously but is agreeable now  VO requested for speech therapy evaluation

## 2020-02-23 ENCOUNTER — Telehealth: Payer: Self-pay

## 2020-02-23 DIAGNOSIS — M1612 Unilateral primary osteoarthritis, left hip: Secondary | ICD-10-CM | POA: Diagnosis not present

## 2020-02-23 DIAGNOSIS — F0280 Dementia in other diseases classified elsewhere without behavioral disturbance: Secondary | ICD-10-CM | POA: Diagnosis not present

## 2020-02-23 DIAGNOSIS — G20 Parkinson's disease: Secondary | ICD-10-CM | POA: Diagnosis not present

## 2020-02-23 DIAGNOSIS — D638 Anemia in other chronic diseases classified elsewhere: Secondary | ICD-10-CM | POA: Diagnosis not present

## 2020-02-23 DIAGNOSIS — I350 Nonrheumatic aortic (valve) stenosis: Secondary | ICD-10-CM | POA: Diagnosis not present

## 2020-02-23 DIAGNOSIS — K219 Gastro-esophageal reflux disease without esophagitis: Secondary | ICD-10-CM | POA: Diagnosis not present

## 2020-02-23 LAB — MULTIPLE MYELOMA PANEL, SERUM
Albumin SerPl Elph-Mcnc: 3.4 g/dL (ref 2.9–4.4)
Albumin/Glob SerPl: 1.4 (ref 0.7–1.7)
Alpha 1: 0.2 g/dL (ref 0.0–0.4)
Alpha2 Glob SerPl Elph-Mcnc: 0.6 g/dL (ref 0.4–1.0)
B-Globulin SerPl Elph-Mcnc: 0.8 g/dL (ref 0.7–1.3)
Gamma Glob SerPl Elph-Mcnc: 1 g/dL (ref 0.4–1.8)
Globulin, Total: 2.6 g/dL (ref 2.2–3.9)
IgA: 188 mg/dL (ref 61–437)
IgG (Immunoglobin G), Serum: 888 mg/dL (ref 603–1613)
IgM (Immunoglobulin M), Srm: 147 mg/dL — ABNORMAL HIGH (ref 15–143)
Total Protein ELP: 6 g/dL (ref 6.0–8.5)

## 2020-02-23 NOTE — Telephone Encounter (Signed)
Rowe Clack OT with [facility] left v/m requesting change in frequency of [facility] OT visits per pt request. Pt request [place] OT to be changed to 1 x a wk for 6 wks starting the week of 02/26/20.

## 2020-02-23 NOTE — Telephone Encounter (Signed)
Verbal orders given to [facility]. She said with Speech there are some other things they help him with.

## 2020-02-23 NOTE — Telephone Encounter (Signed)
Okay for evaluation I am not sure that he needs speech therapy though

## 2020-02-24 NOTE — Telephone Encounter (Signed)
That is fine I am not surprised that he wanted a less frequent schedule

## 2020-02-26 ENCOUNTER — Telehealth: Payer: Self-pay

## 2020-02-26 ENCOUNTER — Other Ambulatory Visit: Payer: Self-pay | Admitting: Neurology

## 2020-02-26 ENCOUNTER — Other Ambulatory Visit: Payer: Self-pay | Admitting: Oncology

## 2020-02-26 DIAGNOSIS — M1612 Unilateral primary osteoarthritis, left hip: Secondary | ICD-10-CM | POA: Diagnosis not present

## 2020-02-26 DIAGNOSIS — I350 Nonrheumatic aortic (valve) stenosis: Secondary | ICD-10-CM | POA: Diagnosis not present

## 2020-02-26 DIAGNOSIS — D539 Nutritional anemia, unspecified: Secondary | ICD-10-CM

## 2020-02-26 DIAGNOSIS — D638 Anemia in other chronic diseases classified elsewhere: Secondary | ICD-10-CM | POA: Diagnosis not present

## 2020-02-26 DIAGNOSIS — F0280 Dementia in other diseases classified elsewhere without behavioral disturbance: Secondary | ICD-10-CM | POA: Diagnosis not present

## 2020-02-26 DIAGNOSIS — G20 Parkinson's disease: Secondary | ICD-10-CM | POA: Diagnosis not present

## 2020-02-26 DIAGNOSIS — K219 Gastro-esophageal reflux disease without esophagitis: Secondary | ICD-10-CM | POA: Diagnosis not present

## 2020-02-26 LAB — COMP PANEL: LEUKEMIA/LYMPHOMA

## 2020-02-26 NOTE — Telephone Encounter (Signed)
Spoke to [place]. Wife stated this has happened several times recently. I suggested the contact neurology to let them know. She will advise them of that.

## 2020-02-26 NOTE — Telephone Encounter (Signed)
Left message on verified VM.

## 2020-02-26 NOTE — Telephone Encounter (Signed)
I don't know what to make of that report. I am sure he would not agree to ER evaluation if he came back to normal----doesn't really sound like seizure with no post ictal period.  Okay for the speech therapy

## 2020-02-26 NOTE — Telephone Encounter (Signed)
Jennifer speech therapist with [facility] left v/m that she did eval on pt and when Anderson Malta went in room to see pt the pt became verbally unresponsive and was convulsing. HR was in the 30's. After a few mins pt came to and pt said he was fine and his vitals were OK. Anderson Malta wanted Dr Silvio Pate to be aware. Anderson Malta also request verbal orders for [facility] speech therapy 1 x a wk for 7 weeks.

## 2020-02-27 ENCOUNTER — Telehealth: Payer: Self-pay | Admitting: *Deleted

## 2020-02-27 ENCOUNTER — Telehealth: Payer: Self-pay

## 2020-02-27 DIAGNOSIS — M1612 Unilateral primary osteoarthritis, left hip: Secondary | ICD-10-CM | POA: Diagnosis not present

## 2020-02-27 DIAGNOSIS — I350 Nonrheumatic aortic (valve) stenosis: Secondary | ICD-10-CM | POA: Diagnosis not present

## 2020-02-27 DIAGNOSIS — F0280 Dementia in other diseases classified elsewhere without behavioral disturbance: Secondary | ICD-10-CM | POA: Diagnosis not present

## 2020-02-27 DIAGNOSIS — G20 Parkinson's disease: Secondary | ICD-10-CM | POA: Diagnosis not present

## 2020-02-27 DIAGNOSIS — K219 Gastro-esophageal reflux disease without esophagitis: Secondary | ICD-10-CM | POA: Diagnosis not present

## 2020-02-27 DIAGNOSIS — D638 Anemia in other chronic diseases classified elsewhere: Secondary | ICD-10-CM | POA: Diagnosis not present

## 2020-02-27 NOTE — Telephone Encounter (Signed)
Daughter updated 

## 2020-02-27 NOTE — Telephone Encounter (Signed)
Done  Labs Only on 03/01/20 @ 1145 per pt request..

## 2020-02-27 NOTE — Telephone Encounter (Signed)
12:47pm-1:03pm Palliative care SW returned call to patient's daughter-Casey Tucker (spelling maybe wrong). She advised that she was worried about her father. She stated that he is not eating, he is "emaciated" from not eating despite the family sending hot meals to him through a delivery service daily. She stated that her mother's dementia is advancing and because of this, she is not feeding patient. She states that her mother is forgetful, where she mixes up patient's medication or either does not give it to him and when the food is delivered she throws it in the trash. SW advice to daughter is that it appears that patient needs more care or possible placement. Casey Tucker states that they have hired care and her mother sends them away and they refused to leave their home. Casey Tucker stated that her parents are not listening to her and "need a higher power" than her to tell them that they need more help. Or perhaps the palliative team should tell her mother that she has to get more help or go into placement. SW advised her that it was not this teams role to tell her parents what to do. Instead we can empower her to take action to ensure they are safe and their needs are met and help the family facilitate and guide them in getting resources for additional help or placement. Casey Tucker asked when would an authority get involved. 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 SW advised her that she will consult with the NP and primary team and get back to her. Casey Tucker thanked SW for the call.

## 2020-02-27 NOTE — Telephone Encounter (Signed)
Patient notified.  Please schedule him for lab only this week and call them with appt details.

## 2020-02-27 NOTE — Telephone Encounter (Signed)
Daughter [name] who was conferenced in during patient visit called asking if lab results are back and if a decision has been made regarding a biopsy yet. She requests that she be called due to her parents memory issues.

## 2020-02-27 NOTE — Telephone Encounter (Signed)
-----   Message from Earlie Server, MD sent at 02/26/2020  5:41 PM EDT ----- His blood work is not conclusive. Recommend him to continue B12 supplementation and have lab encounter this week for MMA and direct bilirubin testing. Ordered. Thanks.

## 2020-02-28 DIAGNOSIS — D638 Anemia in other chronic diseases classified elsewhere: Secondary | ICD-10-CM | POA: Diagnosis not present

## 2020-02-28 DIAGNOSIS — K219 Gastro-esophageal reflux disease without esophagitis: Secondary | ICD-10-CM | POA: Diagnosis not present

## 2020-02-28 DIAGNOSIS — M1612 Unilateral primary osteoarthritis, left hip: Secondary | ICD-10-CM | POA: Diagnosis not present

## 2020-02-28 DIAGNOSIS — I350 Nonrheumatic aortic (valve) stenosis: Secondary | ICD-10-CM | POA: Diagnosis not present

## 2020-02-28 DIAGNOSIS — F0280 Dementia in other diseases classified elsewhere without behavioral disturbance: Secondary | ICD-10-CM | POA: Diagnosis not present

## 2020-02-28 DIAGNOSIS — G20 Parkinson's disease: Secondary | ICD-10-CM | POA: Diagnosis not present

## 2020-02-29 ENCOUNTER — Telehealth: Payer: Self-pay | Admitting: Internal Medicine

## 2020-02-29 DIAGNOSIS — K219 Gastro-esophageal reflux disease without esophagitis: Secondary | ICD-10-CM | POA: Diagnosis not present

## 2020-02-29 DIAGNOSIS — G20 Parkinson's disease: Secondary | ICD-10-CM | POA: Diagnosis not present

## 2020-02-29 DIAGNOSIS — M1612 Unilateral primary osteoarthritis, left hip: Secondary | ICD-10-CM | POA: Diagnosis not present

## 2020-02-29 DIAGNOSIS — F0280 Dementia in other diseases classified elsewhere without behavioral disturbance: Secondary | ICD-10-CM | POA: Diagnosis not present

## 2020-02-29 DIAGNOSIS — I350 Nonrheumatic aortic (valve) stenosis: Secondary | ICD-10-CM | POA: Diagnosis not present

## 2020-02-29 DIAGNOSIS — D638 Anemia in other chronic diseases classified elsewhere: Secondary | ICD-10-CM | POA: Diagnosis not present

## 2020-02-29 NOTE — Telephone Encounter (Signed)
[facility]y Tucker @ [facility] called to let you know pt fell yesterday.  He has skin tear on back of right hand.  Skin tear on left elbow quarter size bruise left cheek (face).  Pt denies any other injuries or pain

## 2020-02-29 NOTE — Telephone Encounter (Signed)
Unfortunately---not an unusual occurrence for him. Glad to hear he didn't get any major injury

## 2020-03-01 ENCOUNTER — Other Ambulatory Visit: Payer: Self-pay

## 2020-03-01 ENCOUNTER — Inpatient Hospital Stay: Payer: Medicare Other

## 2020-03-01 DIAGNOSIS — D539 Nutritional anemia, unspecified: Secondary | ICD-10-CM

## 2020-03-01 DIAGNOSIS — D708 Other neutropenia: Secondary | ICD-10-CM

## 2020-03-01 DIAGNOSIS — D649 Anemia, unspecified: Secondary | ICD-10-CM | POA: Diagnosis not present

## 2020-03-01 DIAGNOSIS — G20 Parkinson's disease: Secondary | ICD-10-CM | POA: Diagnosis not present

## 2020-03-01 DIAGNOSIS — D709 Neutropenia, unspecified: Secondary | ICD-10-CM | POA: Diagnosis not present

## 2020-03-01 DIAGNOSIS — D72819 Decreased white blood cell count, unspecified: Secondary | ICD-10-CM | POA: Diagnosis not present

## 2020-03-01 LAB — BILIRUBIN, DIRECT: Bilirubin, Direct: 0.2 mg/dL (ref 0.0–0.2)

## 2020-03-04 ENCOUNTER — Telehealth: Payer: Self-pay

## 2020-03-04 DIAGNOSIS — D638 Anemia in other chronic diseases classified elsewhere: Secondary | ICD-10-CM | POA: Diagnosis not present

## 2020-03-04 DIAGNOSIS — I350 Nonrheumatic aortic (valve) stenosis: Secondary | ICD-10-CM | POA: Diagnosis not present

## 2020-03-04 DIAGNOSIS — G20 Parkinson's disease: Secondary | ICD-10-CM | POA: Diagnosis not present

## 2020-03-04 DIAGNOSIS — M1612 Unilateral primary osteoarthritis, left hip: Secondary | ICD-10-CM | POA: Diagnosis not present

## 2020-03-04 DIAGNOSIS — F0280 Dementia in other diseases classified elsewhere without behavioral disturbance: Secondary | ICD-10-CM | POA: Diagnosis not present

## 2020-03-04 DIAGNOSIS — K219 Gastro-esophageal reflux disease without esophagitis: Secondary | ICD-10-CM | POA: Diagnosis not present

## 2020-03-04 LAB — METHYLMALONIC ACID, SERUM: Methylmalonic Acid, Quantitative: 142 nmol/L (ref 0–378)

## 2020-03-04 NOTE — Telephone Encounter (Signed)
Patient's daughter [name] returned RN's phone call from this AM.  [name] reports patient is scheduled to see Dr Tasia Catchings this week. [name] reports patients hemoglobin was 7 and MD is wanting to discuss patient having bone marrow biopsy. [name] states MD feels patient may have  acute myeloid dysplastic syndrome. [name] states they have arranged for patient and wife to have caregiver for 4 hours a day 5 days per week. [name] concerned that patient is falling more and is aware patient and wife need more care. Wife forgets to fix patient meals and will refuse help when home health and hired caregivers would show up.  Nurse offered palliative care visit and daughter in agreement with nurse making home visit on Wednesday at 69 AM.  Nurse has daughters contact number to call daughter with any concerns. Daughter verbalizes appreciation for palliative care services and nurse making home visit.

## 2020-03-04 NOTE — Telephone Encounter (Signed)
Telephone call to patients daughter [name].  RN LM requesting call back to discuss hospice care verses palliative care and offer to make palliative care home visit.

## 2020-03-04 NOTE — Telephone Encounter (Signed)
-----   Message from Earlie Server, MD sent at 03/04/2020  9:08 AM EDT ----- Please arrange him to have a virtual or in person visit to discuss results. Thanks.

## 2020-03-04 NOTE — Telephone Encounter (Signed)
Message sent to scheduling 

## 2020-03-05 DIAGNOSIS — I350 Nonrheumatic aortic (valve) stenosis: Secondary | ICD-10-CM | POA: Diagnosis not present

## 2020-03-05 DIAGNOSIS — K219 Gastro-esophageal reflux disease without esophagitis: Secondary | ICD-10-CM | POA: Diagnosis not present

## 2020-03-05 DIAGNOSIS — M1612 Unilateral primary osteoarthritis, left hip: Secondary | ICD-10-CM | POA: Diagnosis not present

## 2020-03-05 DIAGNOSIS — F0280 Dementia in other diseases classified elsewhere without behavioral disturbance: Secondary | ICD-10-CM | POA: Diagnosis not present

## 2020-03-05 DIAGNOSIS — G20 Parkinson's disease: Secondary | ICD-10-CM | POA: Diagnosis not present

## 2020-03-05 DIAGNOSIS — D638 Anemia in other chronic diseases classified elsewhere: Secondary | ICD-10-CM | POA: Diagnosis not present

## 2020-03-06 ENCOUNTER — Inpatient Hospital Stay: Payer: Medicare Other | Admitting: Oncology

## 2020-03-06 ENCOUNTER — Other Ambulatory Visit: Payer: Medicare Other

## 2020-03-06 ENCOUNTER — Other Ambulatory Visit: Payer: Self-pay

## 2020-03-06 VITALS — BP 118/60 | HR 77 | Temp 98.0°F | Resp 18

## 2020-03-06 DIAGNOSIS — K219 Gastro-esophageal reflux disease without esophagitis: Secondary | ICD-10-CM | POA: Diagnosis not present

## 2020-03-06 DIAGNOSIS — Z515 Encounter for palliative care: Secondary | ICD-10-CM

## 2020-03-06 DIAGNOSIS — G20 Parkinson's disease: Secondary | ICD-10-CM | POA: Diagnosis not present

## 2020-03-06 DIAGNOSIS — F0280 Dementia in other diseases classified elsewhere without behavioral disturbance: Secondary | ICD-10-CM | POA: Diagnosis not present

## 2020-03-06 DIAGNOSIS — D638 Anemia in other chronic diseases classified elsewhere: Secondary | ICD-10-CM | POA: Diagnosis not present

## 2020-03-06 DIAGNOSIS — I350 Nonrheumatic aortic (valve) stenosis: Secondary | ICD-10-CM | POA: Diagnosis not present

## 2020-03-06 DIAGNOSIS — M1612 Unilateral primary osteoarthritis, left hip: Secondary | ICD-10-CM | POA: Diagnosis not present

## 2020-03-06 NOTE — Progress Notes (Signed)
PATIENT NAME: Casey Tucker DOB: November 08, 1940 MRN: [id number]  PRIMARY CARE PROVIDER: [name], MD  RESPONSIBLE PARTY:  Acct ID - Guarantor Home Phone Work Phone Relationship Acct Type  [id number] [name]  Self P/F     [address], [place], [place] [address]    PLAN OF CARE and INTERVENTIONS:               1.  GOALS OF CARE/ ADVANCE CARE PLANNING:  Remain in home for as long as patients care can be managed.               2.  PATIENT/CAREGIVER EDUCATION:  Education on fall precautions, education on swallowing precautions, reviewed meds, support               3.  DISEASE STATUS:  RN made scheduled home palliative care visit. [facility] physical therapist was exiting the home and speech therapist Casey Tucker was arriving at the home when nurse arrived. Patient sitting in his chair in living room. Patients wife Casey Tucker and caregiver Casey Tucker in home with patient. Patient currently rates pain at 4 on pain scale and reports pain is in his legs. Patient reminded that he just had Physical Therapy thus causing increased pain.  Patient did take one capsule 100 mg Gabapentin during visit. Patient and wife talked about patients medications and speech therapist made recommendation to have alarm set on patients clock however patient and wife not open to this. Wife has been filling patients medbox. Patient and wife both have issues with memory and get confused.  Patient reports he suffered a fall 1  week ago. Patient saw Dr Tasia Catchings at cancer center and had blood work. Patient is awaiting results of blood work. Patients weight 143 lbs. Patients appetite is fair. Patient talked with speech therapist about increased saliva. Speech therapist provided education on botox injections and reminded patient to swallow frequently. Education to patient also to chew gum or suck on hard candy to also assist with swallowing saliva. Daughter Casey Tucker had informed nurse patients hemoglobin was down to 7. Patient did  not receive blood or platelets. Patients vital signs are stable today. Patient continues to ambulate with his rolling walker. Patient has no edema to lower extremities. Patient continues to have incontinence at night. Nurse reviewed medications with patient and wife. Patient and wife remain in agreement with palliative care services. Patient, wife and caregiver encouraged to contact palliative care with questions or concerns.      HISTORY OF PRESENT ILLNESS:  Patient is a 50 yesr old male who resides in home with wife Casey Tucker.  Patient currently receiving HH.  Patient is followed by palliative care and is seen monthly and PRN.    CODE STATUS: Full Code   ADVANCED DIRECTIVES: Y MOST FORM: No PPS: 50%   PHYSICAL EXAM:   VITALS: Today's Vitals   03/06/20 1115  BP: 118/60  Pulse: 77  Resp: 18  Temp: 98 F (36.7 C)  TempSrc: Temporal  SpO2: 98%  PainSc: 4   PainLoc: Leg    LUNGS: patient denied having shortness of breath or cough CARDIAC: Cor RRR  EXTREMITIES: none edema SKIN: healing bruise on left cheek patient suffered bruise when he suffered a fall 1 week ago  NEURO: positive for gait problems, memory problems, tremors and weakness       Nilda Simmer, RN

## 2020-03-07 ENCOUNTER — Telehealth: Payer: Self-pay

## 2020-03-07 NOTE — Telephone Encounter (Signed)
Pt left v/m requesting cb about the referral to [facility] center and progress with that and pt wants to know who sent medicare person for occupational health to pts home and wants cb about that situation also.

## 2020-03-07 NOTE — Telephone Encounter (Signed)
Done.. Per pt request to RTC on 03/11/20 @ 145 MD Only for lab Results.

## 2020-03-07 NOTE — Telephone Encounter (Signed)
He has already seen the hematologist about the anemia. I sent the palliative care specialist ----we spoke about that and he agreed

## 2020-03-07 NOTE — Telephone Encounter (Signed)
Pt no showed to appt on 8/18 for lab results. Please reschedule and notify pt.

## 2020-03-08 DIAGNOSIS — K219 Gastro-esophageal reflux disease without esophagitis: Secondary | ICD-10-CM | POA: Diagnosis not present

## 2020-03-08 DIAGNOSIS — I350 Nonrheumatic aortic (valve) stenosis: Secondary | ICD-10-CM | POA: Diagnosis not present

## 2020-03-08 DIAGNOSIS — G20 Parkinson's disease: Secondary | ICD-10-CM | POA: Diagnosis not present

## 2020-03-08 DIAGNOSIS — D638 Anemia in other chronic diseases classified elsewhere: Secondary | ICD-10-CM | POA: Diagnosis not present

## 2020-03-08 DIAGNOSIS — F0280 Dementia in other diseases classified elsewhere without behavioral disturbance: Secondary | ICD-10-CM | POA: Diagnosis not present

## 2020-03-08 DIAGNOSIS — M1612 Unilateral primary osteoarthritis, left hip: Secondary | ICD-10-CM | POA: Diagnosis not present

## 2020-03-11 ENCOUNTER — Inpatient Hospital Stay: Payer: Medicare Other

## 2020-03-11 ENCOUNTER — Other Ambulatory Visit: Payer: Self-pay

## 2020-03-11 ENCOUNTER — Encounter: Payer: Self-pay | Admitting: Oncology

## 2020-03-11 ENCOUNTER — Inpatient Hospital Stay (HOSPITAL_BASED_OUTPATIENT_CLINIC_OR_DEPARTMENT_OTHER): Payer: Medicare Other | Admitting: Oncology

## 2020-03-11 VITALS — BP 99/55 | HR 63 | Temp 97.3°F | Resp 18 | Wt 152.1 lb

## 2020-03-11 DIAGNOSIS — D539 Nutritional anemia, unspecified: Secondary | ICD-10-CM

## 2020-03-11 DIAGNOSIS — D708 Other neutropenia: Secondary | ICD-10-CM | POA: Diagnosis not present

## 2020-03-11 DIAGNOSIS — G20 Parkinson's disease: Secondary | ICD-10-CM | POA: Diagnosis not present

## 2020-03-11 DIAGNOSIS — D649 Anemia, unspecified: Secondary | ICD-10-CM | POA: Diagnosis not present

## 2020-03-11 DIAGNOSIS — D72819 Decreased white blood cell count, unspecified: Secondary | ICD-10-CM | POA: Diagnosis not present

## 2020-03-11 DIAGNOSIS — D709 Neutropenia, unspecified: Secondary | ICD-10-CM | POA: Diagnosis not present

## 2020-03-11 LAB — CBC WITH DIFFERENTIAL/PLATELET
Abs Immature Granulocytes: 0.04 10*3/uL (ref 0.00–0.07)
Basophils Absolute: 0.1 10*3/uL (ref 0.0–0.1)
Basophils Relative: 1 %
Eosinophils Absolute: 0.3 10*3/uL (ref 0.0–0.5)
Eosinophils Relative: 5 %
HCT: 26.9 % — ABNORMAL LOW (ref 39.0–52.0)
Hemoglobin: 9.3 g/dL — ABNORMAL LOW (ref 13.0–17.0)
Immature Granulocytes: 1 %
Lymphocytes Relative: 23 %
Lymphs Abs: 1.2 10*3/uL (ref 0.7–4.0)
MCH: 41.2 pg — ABNORMAL HIGH (ref 26.0–34.0)
MCHC: 34.6 g/dL (ref 30.0–36.0)
MCV: 119 fL — ABNORMAL HIGH (ref 80.0–100.0)
Monocytes Absolute: 0.7 10*3/uL (ref 0.1–1.0)
Monocytes Relative: 14 %
Neutro Abs: 2.9 10*3/uL (ref 1.7–7.7)
Neutrophils Relative %: 56 %
Platelets: 275 10*3/uL (ref 150–400)
RBC: 2.26 MIL/uL — ABNORMAL LOW (ref 4.22–5.81)
RDW: 13.7 % (ref 11.5–15.5)
WBC: 5.1 10*3/uL (ref 4.0–10.5)
nRBC: 0 % (ref 0.0–0.2)

## 2020-03-11 LAB — RETIC PANEL
Immature Retic Fract: 13.9 % (ref 2.3–15.9)
RBC.: 2.3 MIL/uL — ABNORMAL LOW (ref 4.22–5.81)
Retic Count, Absolute: 50.4 10*3/uL (ref 19.0–186.0)
Retic Ct Pct: 2.2 % (ref 0.4–3.1)
Reticulocyte Hemoglobin: 39.6 pg (ref >27.9)

## 2020-03-11 LAB — VITAMIN B12: Vitamin B-12: 1078 pg/mL — ABNORMAL HIGH (ref 180–914)

## 2020-03-11 NOTE — Progress Notes (Signed)
Pt here for follow up. No new concerns voiced.   

## 2020-03-11 NOTE — Progress Notes (Signed)
Hematology/Oncology  Follow up note [facility] Telephone:([phone]) [phone] [phone]) [phone]   Patient Care Team: [name], MD as PCP - General (Pediatrics) Tat, Eustace Quail, DO as Consulting Physician (Neurology)  REFERRING PROVIDER: [name], MD  CHIEF COMPLAINTS/REASON FOR VISIT:  Follow up  anemia and leukopenia  HISTORY OF PRESENTING ILLNESS:   Casey Tucker is a  79 y.o.  male with PMH listed below was seen in consultation at the request of  [name], MD  for evaluation of anemia and leukopenia  Patient was accompanied by wife today.  Patient's daughter [name] was called per patient's request. 02/08/2020, patient had a CBC showed WBC 2.6, hemoglobin 8.5, MCV 119, platelet count 245. 02/07/2020, hemoglobin 7.6, MCV 121, differential showed neutropenia with ANC 0.9.  Patient reports 20 pound loss in the past 6 to 12 months.  Fatigue and weakness.  Appetite is fair. Wife reports that patient takes naps during the day.  No fever, chills, night sweating. Chronic history of Parkinson's disease on Carbidopa-levodopa, gabapentin for chronic pain. Patient drinks wine occasionally.  Denies smoking history.   INTERVAL HISTORY Casey Tucker is a 79 y.o. male who has above history reviewed by me today presents for follow up visit for management of anemia and leukopenia  Problems and complaints are listed below: Patient had blood work done and presents to discuss results.  Per patient's request, I also called patient's daughter [name] and she is able to hear the entire conversation and participate in discussion. Patient has been started on oral vitamin B12 supplementation during the interval.  He reports feeling subjectively better with improved appetite and energy level. His weight has slightly improved since his visit 2 weeks ago. Daughter also reports that patient has had chronic positional low blood pressure and syncope episodes.  Review  of Systems  Constitutional: Positive for fatigue and unexpected weight change. Negative for chills and fever.  HENT:   Negative for hearing loss and voice change.   Eyes: Negative for eye problems and icterus.  Respiratory: Negative for chest tightness, cough and shortness of breath.   Cardiovascular: Negative for chest pain and leg swelling.  Gastrointestinal: Negative for abdominal distention and abdominal pain.  Endocrine: Negative for hot flashes.  Genitourinary: Negative for difficulty urinating, dysuria and frequency.   Musculoskeletal: Negative for arthralgias.  Skin: Negative for itching and rash.  Neurological: Negative for light-headedness and numbness.  Hematological: Negative for adenopathy. Does not bruise/bleed easily.  Psychiatric/Behavioral: Negative for confusion.    MEDICAL HISTORY:  Past Medical History:  Diagnosis Date  . Arthritis    left hip   . GERD (gastroesophageal reflux disease) Pre 2002   Mild  . History of kidney stones   . 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 Parkinson disease ([place]) 2004   Slowly progressive  . Ruptured appendix teens    SURGICAL HISTORY: Past Surgical History:  Procedure Laterality Date  . APPENDECTOMY    . CATARACT EXTRACTION W/ INTRAOCULAR LENS  IMPLANT, BILATERAL  2017  . DEEP BRAIN STIMULATOR PLACEMENT  8/14   L STN  . DOPPLER ECHOCARDIOGRAPHY  01/10/2009   LV NML Mild LVH EF 60-65% aortic sclerosis w/0 stenosis   . JOINT REPLACEMENT     left hip replacement 08/14/11/[facility]   . SUBTHALAMIC STIMULATOR BATTERY REPLACEMENT Left 03/25/2017   Procedure: Deep Brain Stimulator battery replacement;  Surgeon: Erline Levine, MD;  Location:  [facility];  Service: Neurosurgery;  Laterality: Left;  left  . TONSILLECTOMY    . TOTAL HIP REVISION  08/27/2011   Procedure: TOTAL HIP REVISION;  Surgeon: Mauri Pole, MD;  Location: [facility];  Service: Orthopedics;   Laterality: Left;    SOCIAL HISTORY: Social History   Socioeconomic History  . Marital status: Married    Spouse name: Not on file  . Number of children: 2  . Years of education: Not on file  . Highest education level: Doctorate  Occupational History  . Occupation: Optometrist    Comment: medically retired  Tobacco Use  . Smoking status: Never Smoker  . Smokeless tobacco: Never Used  Vaping Use  . Vaping Use: Never used  Substance and Sexual Activity  . Alcohol use: Yes    Alcohol/week: 7.0 standard drinks    Types: 7 Standard drinks or equivalent per week    Comment: occassionally  . Drug use: No  . Sexual activity: Yes  Other Topics Concern  . Not on file  Social History Narrative   Married, lives with wife   2 daughters   Right handed   Has living will   Wife has health care POA---then daughters   Would still accept CPR--but no prolonged artificial means (ventilator or tube feeds)   Social Determinants of Health   Financial Resource Strain:   . Difficulty of Paying Living Expenses: Not on file  Food Insecurity:   . Worried About Charity fundraiser in the Last Year: Not on file  . Ran Out of Food in the Last Year: Not on file  Transportation Needs:   . Lack of Transportation (Medical): Not on file  . Lack of Transportation (Non-Medical): Not on file  Physical Activity:   . Days of Exercise per Week: Not on file  . Minutes of Exercise per Session: Not on file  Stress:   . Feeling of Stress : Not on file  Social Connections:   . Frequency of Communication with Friends and Family: Not on file  . Frequency of Social Gatherings with Friends and Family: Not on file  . Attends Religious Services: Not on file  . Active Member of Clubs or Organizations: Not on file  . Attends Archivist Meetings: Not on file  . Marital Status: Not on file  Intimate Partner Violence:   . Fear of Current or Ex-Partner: Not on file  . Emotionally Abused: Not on file  .  Physically Abused: Not on file  . Sexually Abused: Not on file    FAMILY HISTORY: Family History  Problem Relation Age of Onset  . Arthritis Mother        knee replacement  . COPD Father        emphysema, smoker  . Heart disease Father        CHF  . Healthy Sister   . Alcohol abuse Paternal Uncle   . Rheum arthritis Daughter     ALLERGIES:  is allergic to amoxicillin, celebrex [celecoxib], demerol, and penicillins.  MEDICATIONS:  Current Outpatient Medications  Medication Sig Dispense Refill  . carbidopa-levodopa (SINEMET IR) 25-100 MG tablet TAKE TWO TABLETS AT 7:30AM, ONE AT 10AM,ONE AT 1PM, ONE AT 4PM AND 1 AT 7PM. 540 tablet 0  . gabapentin (NEURONTIN) 100 MG capsule Take 1 capsule (100 mg total) by mouth 3 (three) times daily. And 226m at bedtime (2 capsules) 150 capsule 5  . magnesium hydroxide (MILK OF MAGNESIA) 400 MG/5ML suspension Take 5  mLs by mouth daily as needed for mild constipation.    . polyethylene glycol (MIRALAX / GLYCOLAX) 17 g packet Take 17 g by mouth daily as needed.     . vitamin B-12 (CYANOCOBALAMIN) 1000 MCG tablet Take 1 tablet (1,000 mcg total) by mouth daily. 30 tablet 0  . meloxicam (MOBIC) 15 MG tablet Take 15 mg by mouth daily. (Patient not taking: Reported on 03/11/2020)     No current facility-administered medications for this visit.     PHYSICAL EXAMINATION: ECOG PERFORMANCE STATUS: 2 - Symptomatic, <50% confined to bed Vitals:   03/11/20 1336  BP: (!) 99/55  Pulse: 63  Resp: 18  Temp: (!) 97.3 F (36.3 C)   Filed Weights   03/11/20 1336  Weight: 152 lb 1.6 oz (69 kg)    Physical Exam Constitutional:      General: He is not in acute distress.    Comments: Patient sits in a wheelchair today.  Thin built  HENT:     Head: Normocephalic and atraumatic.  Eyes:     General: No scleral icterus. Cardiovascular:     Rate and Rhythm: Normal rate and regular rhythm.     Heart sounds: Normal heart sounds.  Pulmonary:     Effort:  Pulmonary effort is normal. No respiratory distress.     Breath sounds: No wheezing.  Abdominal:     General: Bowel sounds are normal. There is no distension.     Palpations: Abdomen is soft.  Musculoskeletal:        General: No deformity. Normal range of motion.     Cervical back: Normal range of motion and neck supple.  Skin:    General: Skin is warm and dry.     Findings: No erythema or rash.  Neurological:     Mental Status: He is alert and oriented to person, place, and time. Mental status is at baseline.     Cranial Nerves: No cranial nerve deficit.     Coordination: Coordination normal.  Psychiatric:        Mood and Affect: Mood normal.     LABORATORY DATA:  I have reviewed the data as listed Lab Results  Component Value Date   WBC 5.1 03/11/2020   HGB 9.3 (L) 03/11/2020   HCT 26.9 (L) 03/11/2020   MCV 119.0 (H) 03/11/2020   PLT 275 03/11/2020   Recent Labs    04/07/19 1220 04/07/19 1220 01/18/20 2225 01/18/20 2225 02/07/20 1209 02/08/20 0429 02/21/20 1204 03/01/20 1142  NA 138   < > 138   < > 138 139 137  --   K 4.4   < > 3.9   < > 3.7 3.5 4.3  --   CL 102   < > 105   < > 105 107 102  --   CO2 30   < > 29   < > 25 28 29   --   GLUCOSE 82   < > 147*   < > 109* 98 101*  --   BUN 17   < > 22   < > 21 13 21   --   CREATININE 0.96   < > 0.71   < > 0.65 0.68 0.73  --   CALCIUM 9.4   < > 7.6*   < > 7.6* 8.1* 8.5*  --   GFRNONAA  --   --  >60   < > >60 >60 >60  --   GFRAA  --   --  >  60   < > >60 >60 >60  --   PROT 6.2  --  5.5*  --   --   --  6.7  --   ALBUMIN 4.1  --  3.2*  --   --   --  3.9  --   AST 10  --  9*  --   --   --  14*  --   ALT 4  --  <5  --   --   --  5  --   ALKPHOS 65  --  42  --   --   --  64  --   BILITOT 2.1*  --  1.4*  --   --   --  2.0*  --   BILIDIR  --   --   --   --   --   --   --  0.2   < > = values in this interval not displayed.   Iron/TIBC/Ferritin/ %Sat    Component Value Date/Time   IRON 173 02/21/2020 1204   TIBC 252  02/21/2020 1204   FERRITIN 283 02/21/2020 1204   IRONPCTSAT 69 (H) 02/21/2020 1204      RADIOGRAPHIC STUDIES: I have personally reviewed the radiological images as listed and agreed with the findings in the report. No results found.    ASSESSMENT & PLAN:  1. Macrocytic anemia   2. Other neutropenia ([place])    #Chronic macrocytic anemia, reviewed previous blood records.  Patient has been anemia since 2018,  Chronically macrocytic, with MCV progressively increasing since 2009. Discussed with patient about her lab work-up results. Smear showed hyper segmental neutrophilia which may be secondary to B12 deficiency. Vitamin B12 level is in the 500s.  MMA is normal.  Decreased LDH, negative hepatitis and HIV Peripheral blood flow cytometry showed no significant immunophenotypic abnormality. Leukopenia has improved. Since patient has reported clinical improvement.  I recommend to repeat CBC and vitamin B12 level today.  If no significant improvement, will proceed with bone marrow biopsy.  He and her daughter agree with the plan.  -Labs reviewed.  Hemoglobin has improved to 9.3, his hemoglobin has trended up since July 2021.  Leukopenia has completely resolved.  I think the acute drop of her hemoglobin and neutropenia potentially for reactive and the patient is recovering to his baseline.  However it appears that he has been chronically anemic with increased MCV for many years.  Not explained by vitamin B12 deficiency or folate deficiency. I would recommend patient to proceed with bone marrow biopsy for further evaluation .   Orders Placed This Encounter  Procedures  . CBC with Differential/Platelet    Standing Status:   Future    Number of Occurrences:   1    Standing Expiration Date:   03/11/2021  . Retic Panel    Standing Status:   Future    Number of Occurrences:   1    Standing Expiration Date:   03/11/2021  . Vitamin B12    Standing Status:   Future    Number of Occurrences:   1     Standing Expiration Date:   03/11/2021    All questions were answered. The patient knows to call the clinic with any problems questions or concerns.  cc [name], MD    Return of visit: 2 weeks after bone marrow biopsy. Thank you for this kind referral and the opportunity to participate in the care of this patient. A copy of  today's note is routed to referring provider    Earlie Server, MD, PhD Hematology Oncology [facility] at [facility] Pager- [phone] 03/11/2020

## 2020-03-12 ENCOUNTER — Telehealth: Payer: Self-pay

## 2020-03-12 ENCOUNTER — Telehealth: Payer: Self-pay | Admitting: *Deleted

## 2020-03-12 DIAGNOSIS — D638 Anemia in other chronic diseases classified elsewhere: Secondary | ICD-10-CM | POA: Diagnosis not present

## 2020-03-12 DIAGNOSIS — K219 Gastro-esophageal reflux disease without esophagitis: Secondary | ICD-10-CM | POA: Diagnosis not present

## 2020-03-12 DIAGNOSIS — F0280 Dementia in other diseases classified elsewhere without behavioral disturbance: Secondary | ICD-10-CM | POA: Diagnosis not present

## 2020-03-12 DIAGNOSIS — G20 Parkinson's disease: Secondary | ICD-10-CM | POA: Diagnosis not present

## 2020-03-12 DIAGNOSIS — D539 Nutritional anemia, unspecified: Secondary | ICD-10-CM

## 2020-03-12 DIAGNOSIS — M1612 Unilateral primary osteoarthritis, left hip: Secondary | ICD-10-CM | POA: Diagnosis not present

## 2020-03-12 DIAGNOSIS — I350 Nonrheumatic aortic (valve) stenosis: Secondary | ICD-10-CM | POA: Diagnosis not present

## 2020-03-12 NOTE — Telephone Encounter (Signed)
Please call pt

## 2020-03-12 NOTE — Telephone Encounter (Signed)
Daughter and I have been recommending that for months and they adamantly refuse. I am okay with their social worker trying again

## 2020-03-12 NOTE — Telephone Encounter (Signed)
RN called and spoke with pt wife and provided her with the bone marrow procedure date 03/19/20 at 57am, arrive at 66am and pt needed someone to go with him.  Instructed that radiology would call 1-2 days prior to procedure. RN also gave wife appt info to return to cancer center to see Dr. Tasia Catchings on 04/03/20 at 215pm.  Wife verbalized understanding, RN stated for her to call cancer center if she had any further questions.

## 2020-03-12 NOTE — Telephone Encounter (Signed)
Esther OT with [facility] left v/m requesting verbal order for medical social worker order for eval and to discuss possible long term care option due to cognition and medication mismanagement and discuss this with pt, pts daughter and family.

## 2020-03-12 NOTE — Telephone Encounter (Signed)
Bone marrow bx is scheduled for 03/19/20 @ 8:30 and arrive at 7:30.  Please schedule MD in person 2 weeks after biopsy.  I will give them appt detail when I let them know about the biopsy appt.

## 2020-03-12 NOTE — Telephone Encounter (Signed)
-----  Message from Earlie Server, MD sent at 03/12/2020 10:04 AM EDT -----  ----- Message ----- From: Earlie Server, MD Sent: 03/11/2020   8:55 PM EDT To: Evelina Dun, RN  Please arrange him to get bone marrow biopsy- next available spot is fine. And follow up with me 2 weeks after BM biopsy. Pt is aware of the BM biopsy possibility.

## 2020-03-12 NOTE — Telephone Encounter (Signed)
Done.. Pt is scheduled to RTC on 04/03/20 @ 2:15

## 2020-03-12 NOTE — Telephone Encounter (Signed)
Order faxed to specialty scheduling,

## 2020-03-13 ENCOUNTER — Other Ambulatory Visit: Payer: Self-pay | Admitting: Oncology

## 2020-03-13 ENCOUNTER — Telehealth: Payer: Self-pay | Admitting: *Deleted

## 2020-03-13 NOTE — Telephone Encounter (Signed)
Patient notified as instructed by telephone and verbalized understanding..  /Patient wants to know if he needs to keep his follow-up appointment with Dr. Silvio Pate Friday?

## 2020-03-13 NOTE — Telephone Encounter (Signed)
Patient notified as instructed by telephone and verbalized understanding. Patient stated that he will just keep the appointment Friday and his wife agreed.

## 2020-03-13 NOTE — Telephone Encounter (Signed)
I do need to see him back soon---so it would be best to keep that appointment. If he wants to push it back 2-3 weeks, so it is after the bone marrow test---that would be fine

## 2020-03-13 NOTE — Telephone Encounter (Signed)
Please let him know that I think he should proceed with the test. I am concerned that his bone marrow is not working right---and finding out what is going on may give [place] a way to help him feel better. That is why I sent him to the blood doctor

## 2020-03-13 NOTE — Telephone Encounter (Signed)
Jeani Hawking with [facility] called and was advised of Dr. Alla German response to Medical Social Worker.  Jeani Hawking stated that Sherlynn Stalls OT feels that she needs one more visit this week with patient and wants to make sure that is okay. Advised Jeani Hawking that message will go back to Dr. Silvio Pate and if she does not hear anything back the extra visit should be okay.

## 2020-03-13 NOTE — Telephone Encounter (Signed)
Patient called stating that he saw the cancer doctor and he has recommended that he have a bone marrow biopsy done. Patient stated that he is not sure that he wants to have the procedure done. Patient wants to know what Dr. Silvio Pate thinks about this.

## 2020-03-14 ENCOUNTER — Other Ambulatory Visit: Payer: Self-pay | Admitting: Radiology

## 2020-03-14 DIAGNOSIS — I350 Nonrheumatic aortic (valve) stenosis: Secondary | ICD-10-CM | POA: Diagnosis not present

## 2020-03-14 DIAGNOSIS — F0280 Dementia in other diseases classified elsewhere without behavioral disturbance: Secondary | ICD-10-CM | POA: Diagnosis not present

## 2020-03-14 DIAGNOSIS — M1612 Unilateral primary osteoarthritis, left hip: Secondary | ICD-10-CM | POA: Diagnosis not present

## 2020-03-14 DIAGNOSIS — K219 Gastro-esophageal reflux disease without esophagitis: Secondary | ICD-10-CM | POA: Diagnosis not present

## 2020-03-14 DIAGNOSIS — G20 Parkinson's disease: Secondary | ICD-10-CM | POA: Diagnosis not present

## 2020-03-14 DIAGNOSIS — D638 Anemia in other chronic diseases classified elsewhere: Secondary | ICD-10-CM | POA: Diagnosis not present

## 2020-03-14 NOTE — Telephone Encounter (Signed)
Yes I am fine with that.

## 2020-03-15 ENCOUNTER — Ambulatory Visit (INDEPENDENT_AMBULATORY_CARE_PROVIDER_SITE_OTHER): Payer: Medicare Other | Admitting: Internal Medicine

## 2020-03-15 ENCOUNTER — Other Ambulatory Visit: Payer: Self-pay | Admitting: Radiology

## 2020-03-15 ENCOUNTER — Other Ambulatory Visit: Payer: Self-pay

## 2020-03-15 ENCOUNTER — Encounter: Payer: Self-pay | Admitting: Internal Medicine

## 2020-03-15 DIAGNOSIS — F0150 Vascular dementia without behavioral disturbance: Secondary | ICD-10-CM | POA: Diagnosis not present

## 2020-03-15 DIAGNOSIS — Z9049 Acquired absence of other specified parts of digestive tract: Secondary | ICD-10-CM | POA: Diagnosis not present

## 2020-03-15 DIAGNOSIS — Z9181 History of falling: Secondary | ICD-10-CM | POA: Diagnosis not present

## 2020-03-15 DIAGNOSIS — Z87442 Personal history of urinary calculi: Secondary | ICD-10-CM | POA: Diagnosis not present

## 2020-03-15 DIAGNOSIS — M48062 Spinal stenosis, lumbar region with neurogenic claudication: Secondary | ICD-10-CM

## 2020-03-15 DIAGNOSIS — Z9682 Presence of neurostimulator: Secondary | ICD-10-CM | POA: Diagnosis not present

## 2020-03-15 DIAGNOSIS — G20 Parkinson's disease: Secondary | ICD-10-CM

## 2020-03-15 DIAGNOSIS — M1612 Unilateral primary osteoarthritis, left hip: Secondary | ICD-10-CM | POA: Diagnosis not present

## 2020-03-15 DIAGNOSIS — D649 Anemia, unspecified: Secondary | ICD-10-CM

## 2020-03-15 DIAGNOSIS — D638 Anemia in other chronic diseases classified elsewhere: Secondary | ICD-10-CM | POA: Diagnosis not present

## 2020-03-15 DIAGNOSIS — K219 Gastro-esophageal reflux disease without esophagitis: Secondary | ICD-10-CM | POA: Diagnosis not present

## 2020-03-15 DIAGNOSIS — D72819 Decreased white blood cell count, unspecified: Secondary | ICD-10-CM | POA: Diagnosis not present

## 2020-03-15 DIAGNOSIS — F0280 Dementia in other diseases classified elsewhere without behavioral disturbance: Secondary | ICD-10-CM | POA: Diagnosis not present

## 2020-03-15 DIAGNOSIS — Z9089 Acquired absence of other organs: Secondary | ICD-10-CM | POA: Diagnosis not present

## 2020-03-15 DIAGNOSIS — I350 Nonrheumatic aortic (valve) stenosis: Secondary | ICD-10-CM | POA: Diagnosis not present

## 2020-03-15 NOTE — Progress Notes (Signed)
Subjective:    Patient ID: Casey Tucker, male    DOB: [birth year], 79 y.o.   MRN: [id number]  HPI Here with aide Vicky for follow up  This visit occurred during the SARS-CoV-2 public health emergency.  Safety protocols were in place, including screening questions prior to the visit, additional usage of staff PPE, and extensive cleaning of exam room while observing appropriate contact time as indicated for disinfecting solutions.   Doing "alright" Now with aide about 6 hours per day ---5 days a week She does the shopping, cooking, cleaning Wife helps him with showering and dressing Independent with bathroom---uses commode often  No syncope since hospital stay Some dizziness Reviewed hematology work up Now planning bone marrow biopsy next week  Frustrated by all the people from home health Now coordinating them--still with speech, PT, OT  Pain is better with the gabapentin--but it sedates him  Current Outpatient Medications on File Prior to Visit  Medication Sig Dispense Refill  . carbidopa-levodopa (SINEMET IR) 25-100 MG tablet TAKE TWO TABLETS AT 7:30AM, ONE AT 10AM,ONE AT 1PM, ONE AT 4PM AND 1 AT 7PM. 540 tablet 0  . gabapentin (NEURONTIN) 100 MG capsule Take 1 capsule (100 mg total) by mouth 3 (three) times daily. And 258m at bedtime (2 capsules) 150 capsule 5  . magnesium hydroxide (MILK OF MAGNESIA) 400 MG/5ML suspension Take 5 mLs by mouth daily as needed for mild constipation.    . polyethylene glycol (MIRALAX / GLYCOLAX) 17 g packet Take 17 g by mouth daily as needed.     . vitamin B-12 (CYANOCOBALAMIN) 1000 MCG tablet Take 1 tablet (1,000 mcg total) by mouth daily. 30 tablet 0  . meloxicam (MOBIC) 15 MG tablet Take 15 mg by mouth daily. (Patient not taking: Reported on 03/11/2020)     No current facility-administered medications on file prior to visit.    Allergies  Allergen Reactions  . Amoxicillin   . Celebrex [Celecoxib]   . Demerol Other (See Comments)     Hallucinations   . Penicillins     REACTION: RASH    Past Medical History:  Diagnosis Date  . Arthritis    left hip   . GERD (gastroesophageal reflux disease) Pre 2002   Mild  . History of kidney stones   . 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 Parkinson disease ([place] 2004   Slowly progressive  . Ruptured appendix teens    Past Surgical History:  Procedure Laterality Date  . APPENDECTOMY    . CATARACT EXTRACTION W/ INTRAOCULAR LENS  IMPLANT, BILATERAL  2017  . DEEP BRAIN STIMULATOR PLACEMENT  8/14   L STN  . DOPPLER ECHOCARDIOGRAPHY  01/10/2009   LV NML Mild LVH EF 60-65% aortic sclerosis w/0 stenosis   . JOINT REPLACEMENT     left hip replacement 08/14/11/[place]   . SUBTHALAMIC STIMULATOR BATTERY REPLACEMENT Left 03/25/2017   Procedure: Deep Brain Stimulator battery replacement;  Surgeon: SErline Levine MD;  [place] [facility]  Service: Neurosurgery;  Laterality: Left;  left  . TONSILLECTOMY    . TOTAL HIP REVISION  08/27/2011   Procedure: TOTAL HIP REVISION;  Surgeon: MMauri Pole MD;  Location: [facility];  Service: Orthopedics;  Laterality: Left;    Family History  Problem Relation Age of Onset  . Arthritis Mother        knee replacement  . COPD Father  emphysema, smoker  . Heart disease Father        CHF  . Healthy Sister   . Alcohol abuse Paternal Uncle   . Rheum arthritis Daughter     Social History   Socioeconomic History  . Marital status: Married    Spouse name: Not on file  . Number of children: 2  . Years of education: Not on file  . Highest education level: Doctorate  Occupational History  . Occupation: Optometrist    Comment: medically retired  Tobacco Use  . Smoking status: Never Smoker  . Smokeless tobacco: Never Used  Vaping Use  . Vaping Use: Never used  Substance and Sexual Activity  . Alcohol use: Yes    Alcohol/week: 7.0 standard drinks    Types: 7  Standard drinks or equivalent per week    Comment: occassionally  . Drug use: No  . Sexual activity: Yes  Other Topics Concern  . Not on file  Social History Narrative   Married, lives with wife   2 daughters   Right handed   Has living will   Wife has health care POA---then daughters   Would still accept CPR--but no prolonged artificial means (ventilator or tube feeds)   Social Determinants of Health   Financial Resource Strain:   . Difficulty of Paying Living Expenses: Not on file  Food Insecurity:   . Worried About Charity fundraiser in the Last Year: Not on file  . Ran Out of Food in the Last Year: Not on file  Transportation Needs:   . Lack of Transportation (Medical): Not on file  . Lack of Transportation (Non-Medical): Not on file  Physical Activity:   . Days of Exercise per Week: Not on file  . Minutes of Exercise per Session: Not on file  Stress:   . Feeling of Stress : Not on file  Social Connections:   . Frequency of Communication with Friends and Family: Not on file  . Frequency of Social Gatherings with Friends and Family: Not on file  . Attends Religious Services: Not on file  . Active Member of Clubs or Organizations: Not on file  . Attends Archivist Meetings: Not on file  . Marital Status: Not on file  Intimate Partner Violence:   . Fear of Current or Ex-Partner: Not on file  . Emotionally Abused: Not on file  . Physically Abused: Not on file  . Sexually Abused: Not on file   Review of Systems Appetite fair Has gained some weight    Objective:   Physical Exam Constitutional:      Comments: Looks a little better  Cardiovascular:     Rate and Rhythm: Normal rate and regular rhythm.     Heart sounds: No murmur heard.  No gallop.   Pulmonary:     Effort: Pulmonary effort is normal.     Breath sounds: Normal breath sounds. No wheezing or rales.  Abdominal:     Palpations: Abdomen is soft.     Tenderness: There is no abdominal  tenderness.  Musculoskeletal:     Right lower leg: Edema present.     Left lower leg: No edema.  Neurological:     Mental Status: He is alert.     Comments: Slurred speech and some bradykinesia            Assessment & Plan:

## 2020-03-15 NOTE — Patient Instructions (Signed)
You can add another sinemet tablet at 5:30AM if you awaken then. Just take the medicine and then stay in bed at least another 30-60 minutes before getting up. Try cutting out one of the daytime gabapentin doses---to see if that reduces your sleepiness.

## 2020-03-15 NOTE — Assessment & Plan Note (Signed)
No clear diagnosis as yet Is having bone marrow biopsy tomorrow

## 2020-03-15 NOTE — Assessment & Plan Note (Signed)
Using the gabapentin but he is not sure it is helping Asked him to drop one of the daytime doses---to see if that helps with apparent sedation

## 2020-03-15 NOTE — Assessment & Plan Note (Signed)
Ongoing disability but doing better with increased help Discussed adding a sinemet dose at 5:30AM while he is still in bed (if he wakes up early) ---to reduce his AM "off" period

## 2020-03-15 NOTE — Assessment & Plan Note (Signed)
Mild without apparent progression Doing better overall with increased support

## 2020-03-18 ENCOUNTER — Telehealth: Payer: Self-pay | Admitting: Internal Medicine

## 2020-03-18 ENCOUNTER — Other Ambulatory Visit: Payer: Self-pay | Admitting: Radiology

## 2020-03-18 NOTE — Telephone Encounter (Signed)
Pt spouse called wanting to know Pt is schedule for CT bone marrow tomorrow and wanted to know if he should take Levodopa before the test  Please advise

## 2020-03-18 NOTE — Telephone Encounter (Signed)
It is a bone marrow biopsy and as far as I know, he should take all his regular medications

## 2020-03-18 NOTE — Telephone Encounter (Signed)
I let patient's wife know patient should take all of his regular medications.

## 2020-03-18 NOTE — Progress Notes (Signed)
Patient on schedule for BMB 03/19/2020, called and spoke with daughter/[name] on phone who was given information per request. Made aware to be here @ 0730, NPO after MN as well as driver after discharge. Stated understanding.

## 2020-03-19 ENCOUNTER — Other Ambulatory Visit: Payer: Self-pay

## 2020-03-19 ENCOUNTER — Ambulatory Visit
Admission: RE | Admit: 2020-03-19 | Discharge: 2020-03-19 | Disposition: A | Discharge status: Home / Self Care | Payer: Medicare Other | Source: Ambulatory Visit | Attending: Oncology | Admitting: Oncology

## 2020-03-19 DIAGNOSIS — Z79899 Other long term (current) drug therapy: Secondary | ICD-10-CM | POA: Diagnosis not present

## 2020-03-19 DIAGNOSIS — D72819 Decreased white blood cell count, unspecified: Secondary | ICD-10-CM | POA: Diagnosis not present

## 2020-03-19 DIAGNOSIS — K219 Gastro-esophageal reflux disease without esophagitis: Secondary | ICD-10-CM | POA: Diagnosis not present

## 2020-03-19 DIAGNOSIS — D638 Anemia in other chronic diseases classified elsewhere: Secondary | ICD-10-CM | POA: Diagnosis not present

## 2020-03-19 DIAGNOSIS — R109 Unspecified abdominal pain: Secondary | ICD-10-CM | POA: Insufficient documentation

## 2020-03-19 DIAGNOSIS — G20 Parkinson's disease: Secondary | ICD-10-CM | POA: Insufficient documentation

## 2020-03-19 DIAGNOSIS — D709 Neutropenia, unspecified: Secondary | ICD-10-CM | POA: Diagnosis not present

## 2020-03-19 DIAGNOSIS — D539 Nutritional anemia, unspecified: Secondary | ICD-10-CM | POA: Insufficient documentation

## 2020-03-19 DIAGNOSIS — D649 Anemia, unspecified: Secondary | ICD-10-CM | POA: Diagnosis not present

## 2020-03-19 DIAGNOSIS — I350 Nonrheumatic aortic (valve) stenosis: Secondary | ICD-10-CM | POA: Diagnosis not present

## 2020-03-19 DIAGNOSIS — M1612 Unilateral primary osteoarthritis, left hip: Secondary | ICD-10-CM | POA: Diagnosis not present

## 2020-03-19 DIAGNOSIS — F0280 Dementia in other diseases classified elsewhere without behavioral disturbance: Secondary | ICD-10-CM | POA: Diagnosis not present

## 2020-03-19 LAB — CBC WITH DIFFERENTIAL/PLATELET
Abs Immature Granulocytes: 0.02 10*3/uL (ref 0.00–0.07)
Basophils Absolute: 0.1 10*3/uL (ref 0.0–0.1)
Basophils Relative: 2 %
Eosinophils Absolute: 0.3 10*3/uL (ref 0.0–0.5)
Eosinophils Relative: 8 %
HCT: 28.2 % — ABNORMAL LOW (ref 39.0–52.0)
Hemoglobin: 9.5 g/dL — ABNORMAL LOW (ref 13.0–17.0)
Immature Granulocytes: 1 %
Lymphocytes Relative: 25 %
Lymphs Abs: 1 10*3/uL (ref 0.7–4.0)
MCH: 40.6 pg — ABNORMAL HIGH (ref 26.0–34.0)
MCHC: 33.7 g/dL (ref 30.0–36.0)
MCV: 120.5 fL — ABNORMAL HIGH (ref 80.0–100.0)
Monocytes Absolute: 0.6 10*3/uL (ref 0.1–1.0)
Monocytes Relative: 14 %
Neutro Abs: 2 10*3/uL (ref 1.7–7.7)
Neutrophils Relative %: 50 %
Platelets: 284 10*3/uL (ref 150–400)
RBC: 2.34 MIL/uL — ABNORMAL LOW (ref 4.22–5.81)
RDW: 13.6 % (ref 11.5–15.5)
Smear Review: NORMAL
WBC: 4 10*3/uL (ref 4.0–10.5)
nRBC: 0 % (ref 0.0–0.2)

## 2020-03-19 LAB — PROTIME-INR
INR: 1 (ref 0.8–1.2)
Prothrombin Time: 12.9 seconds (ref 11.4–15.2)

## 2020-03-19 MED ORDER — HEPARIN SOD (PORK) LOCK FLUSH 100 UNIT/ML IV SOLN
INTRAVENOUS | Status: AC
  Filled 2020-03-19: qty 5

## 2020-03-19 MED ORDER — FENTANYL CITRATE (PF) 100 MCG/2ML IJ SOLN
INTRAMUSCULAR | Status: AC | PRN
  Administered 2020-03-19: 25 ug via INTRAVENOUS

## 2020-03-19 MED ORDER — SODIUM CHLORIDE 0.9 % IV SOLN: INTRAVENOUS | Status: DC

## 2020-03-19 MED ORDER — FENTANYL CITRATE (PF) 100 MCG/2ML IJ SOLN
INTRAMUSCULAR | Status: AC
  Filled 2020-03-19: qty 2

## 2020-03-19 MED ORDER — MIDAZOLAM HCL 2 MG/2ML IJ SOLN
INTRAMUSCULAR | Status: AC | PRN
  Administered 2020-03-19: 1 mg via INTRAVENOUS

## 2020-03-19 MED ORDER — MIDAZOLAM HCL 2 MG/2ML IJ SOLN
INTRAMUSCULAR | Status: AC
  Filled 2020-03-19: qty 2

## 2020-03-19 NOTE — Progress Notes (Signed)
Patient clinically stable post BMB per Dr Anselm Pancoast, tolerated well. bandade dry/intact to medial sacral area. Awake/alert and oriented post procedure. Received Versed 1 mg along with Fentanyl 50mcg IV for procedure. Report given to Johnney Ou in specials post procedure with questions answered.

## 2020-03-19 NOTE — Discharge Instructions (Signed)
Moderate Conscious Sedation, Adult, Care After These instructions provide you with information about caring for yourself after your procedure. Your health care provider may also give you more specific instructions. Your treatment has been planned according to current medical practices, but problems sometimes occur. Call your health care provider if you have any problems or questions after your procedure. What can I expect after the procedure? After your procedure, it is common:  To feel sleepy for several hours.  To feel clumsy and have poor balance for several hours.  To have poor judgment for several hours.  To vomit if you eat too soon. Follow these instructions at home: For at least 24 hours after the procedure:   Do not: ? Participate in activities where you could fall or become injured. ? Drive. ? Use heavy machinery. ? Drink alcohol. ? Take sleeping pills or medicines that cause drowsiness. ? Make important decisions or sign legal documents. ? Take care of children on your own.  Rest. Eating and drinking  Follow the diet recommended by your health care provider.  If you vomit: ? Drink water, juice, or soup when you can drink without vomiting. ? Make sure you have little or no nausea before eating solid foods. General instructions  Have a responsible adult stay with you until you are awake and alert.  Take over-the-counter and prescription medicines only as told by your health care provider.  If you smoke, do not smoke without supervision.  Keep all follow-up visits as told by your health care provider. This is important. Contact a health care provider if:  You keep feeling nauseous or you keep vomiting.  You feel light-headed.  You develop a rash.  You have a fever. Get help right away if:  You have trouble breathing. This information is not intended to replace advice given to you by your health care provider. Make sure you discuss any questions you have  with your health care provider. Document Revised: 06/18/2017 Document Reviewed: 10/26/2015 Elsevier Patient Education  2020 [place]. Bone Marrow Aspiration and Bone Marrow Biopsy, Adult, Care After This sheet gives you information about how to care for yourself after your procedure. Your health care provider may also give you more specific instructions. If you have problems or questions, contact your health care provider. What can I expect after the procedure? After the procedure, it is common to have:  Mild pain and tenderness.  Swelling.  Bruising. Follow these instructions at home: Puncture site care   Follow instructions from your health care provider about how to take care of the puncture site. Make sure you: ? Wash your hands with soap and water before and after you change your bandage (dressing). If soap and water are not available, use hand sanitizer. ? Change your dressing as told by your health care provider.  Check your puncture site every day for signs of infection. Check for: ? More redness, swelling, or pain. ? Fluid or blood. ? Warmth. ? Pus or a bad smell. Activity  Return to your normal activities as told by your health care provider. Ask your health care provider what activities are safe for you.  Do not lift anything that is heavier than 10 lb (4.5 kg), or the limit that you are told, until your health care provider says that it is safe.  Do not drive for 24 hours if you were given a sedative during your procedure. General instructions   Take over-the-counter and prescription medicines only as told by your  health care provider.  Do not take baths, swim, or use a hot tub until your health care provider approves. Ask your health care provider if you may take showers. You may only be allowed to take sponge baths.  If directed, put ice on the affected area. To do this: ? Put ice in a plastic bag. ? Place a towel between your skin and the bag. ? Leave  the ice on for 20 minutes, 2-3 times a day.  Keep all follow-up visits as told by your health care provider. This is important. Contact a health care provider if:  Your pain is not controlled with medicine.  You have a fever.  You have more redness, swelling, or pain around the puncture site.  You have fluid or blood coming from the puncture site.  Your puncture site feels warm to the touch.  You have pus or a bad smell coming from the puncture site. Summary  After the procedure, it is common to have mild pain, tenderness, swelling, and bruising.  Follow instructions from your health care provider about how to take care of the puncture site and what activities are safe for you.  Take over-the-counter and prescription medicines only as told by your health care provider.  Contact a health care provider if you have any signs of infection, such as fluid or blood coming from the puncture site. This information is not intended to replace advice given to you by your health care provider. Make sure you discuss any questions you have with your health care provider. Document Revised: 11/22/2018 Document Reviewed: 11/22/2018 Elsevier Patient Education  [place].

## 2020-03-19 NOTE — Procedures (Signed)
Interventional Radiology Procedure:   Indications: Macrocytic anemia  Procedure: CT guided bone marrow biopsy  Findings: 2 aspirates and 1 core from right ilium  Complications: None     EBL: Minimal, less than 10 ml  Plan: Discharge to home in one hour.   Gwenna Fuston R. Doraine Schexnider, MD  Pager: [phone]    

## 2020-03-19 NOTE — Consult Note (Signed)
Chief Complaint: Patient was seen in consultation today for CT-guided bone marrow biopsy Referring Physician(s): Yu,Zhou   Patient Status: [facility] - Out-pt  History of Present Illness: Casey Tucker is a 79 y.o. male with anemia and leukopenia.  Past medical history is significant for Parkinson's disease the patient has a deep brain stimulator device.  Patient has no specific complaints today.  He denies chest pain, shortness of breath, bowel issues, urinary issues or abdominal pain.  Past Medical History:  Diagnosis Date  . Arthritis    left hip   . GERD (gastroesophageal reflux disease) Pre 2002   Mild  . History of kidney stones   . 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 Parkinson disease ([place]) 2004   Slowly progressive  . Ruptured appendix teens    Past Surgical History:  Procedure Laterality Date  . APPENDECTOMY    . CATARACT EXTRACTION W/ INTRAOCULAR LENS  IMPLANT, BILATERAL  2017  . DEEP BRAIN STIMULATOR PLACEMENT  8/14   L STN  . DOPPLER ECHOCARDIOGRAPHY  01/10/2009   LV NML Mild LVH EF 60-65% aortic sclerosis w/0 stenosis   . JOINT REPLACEMENT     left hip replacement 08/14/11/[place]   . SUBTHALAMIC STIMULATOR BATTERY REPLACEMENT Left 03/25/2017   Procedure: Deep Brain Stimulator battery replacement;  Surgeon: Erline Levine, MD;  Location: [place];  Service: Neurosurgery;  Laterality: Left;  left  . TONSILLECTOMY    . TOTAL HIP REVISION  08/27/2011   Procedure: TOTAL HIP REVISION;  Surgeon: Mauri Pole, MD;  Location: [facility];  Service: Orthopedics;  Laterality: Left;    Allergies: Amoxicillin, Celebrex [celecoxib], Demerol, and Penicillins  Medications: Prior to Admission medications   Medication Sig Start Date End Date Taking? Authorizing Provider  carbidopa-levodopa (SINEMET IR) 25-100 MG tablet TAKE TWO TABLETS AT 7:30AM, ONE AT 10AM,ONE AT 1PM, ONE AT 4PM AND 1 AT 7PM.  02/19/20  Yes Tat, Eustace Quail, DO  gabapentin (NEURONTIN) 100 MG capsule Take 1 capsule (100 mg total) by mouth 3 (three) times daily. And 249m at bedtime (2 capsules) 02/12/20  Yes LVenia Carbon MD  magnesium hydroxide (MILK OF MAGNESIA) 400 MG/5ML suspension Take 5 mLs by mouth daily as needed for mild constipation.   Yes [provider]  vitamin B-12 (CYANOCOBALAMIN) 1000 MCG tablet Take 1 tablet (1,000 mcg total) by mouth daily. 02/21/20  Yes YEarlie Server MD  meloxicam (MOBIC) 15 MG tablet Take 15 mg by mouth daily. Patient not taking: Reported on 03/11/2020 02/06/20   [provider]  polyethylene glycol (MIRALAX / GLYCOLAX) 17 g packet Take 17 g by mouth daily as needed.     [provider]     Family History  Problem Relation Age of Onset  . Arthritis Mother        knee replacement  . COPD Father        emphysema, smoker  . Heart disease Father        CHF  . Healthy Sister   . Alcohol abuse Paternal Uncle   . Rheum arthritis Daughter     Social History   Socioeconomic History  . Marital status: Married    Spouse name: Not on file  . Number of children: 2  . Years of education: Not on file  . Highest education level: Doctorate  Occupational History  . Occupation: Optometrist    Comment: medically retired  Tobacco Use  .  Smoking status: Never Smoker  . Smokeless tobacco: Never Used  Vaping Use  . Vaping Use: Never used  Substance and Sexual Activity  . Alcohol use: Yes    Alcohol/week: 7.0 standard drinks    Types: 7 Standard drinks or equivalent per week    Comment: occassionally  . Drug use: No  . Sexual activity: Yes  Other Topics Concern  . Not on file  Social History Narrative   Married, lives with wife   2 daughters   Right handed   Has living will   Wife has health care POA---then daughters   Would still accept CPR--but no prolonged artificial means (ventilator or tube feeds)   Social Determinants of Health   Financial  Resource Strain:   . Difficulty of Paying Living Expenses: Not on file  Food Insecurity:   . Worried About Charity fundraiser in the Last Year: Not on file  . Ran Out of Food in the Last Year: Not on file  Transportation Needs:   . Lack of Transportation (Medical): Not on file  . Lack of Transportation (Non-Medical): Not on file  Physical Activity:   . Days of Exercise per Week: Not on file  . Minutes of Exercise per Session: Not on file  Stress:   . Feeling of Stress : Not on file  Social Connections:   . Frequency of Communication with Friends and Family: Not on file  . Frequency of Social Gatherings with Friends and Family: Not on file  . Attends Religious Services: Not on file  . Active Member of Clubs or Organizations: Not on file  . Attends Archivist Meetings: Not on file  . Marital Status: Not on file     Review of Systems  Constitutional: Negative.   Respiratory: Negative.   Cardiovascular: Negative.   Gastrointestinal: Negative.   Genitourinary: Negative.     Vital Signs: BP 111/62   Pulse (!) 59   Temp 97.6 F (36.4 C) (Oral)   Resp 17   Ht 6' (1.829 m)   Wt 68 kg   SpO2 98%   BMI 20.34 kg/m   Physical Exam Constitutional:      Comments: Patient's speech is somewhat difficult to understand due to Parkinson's disease.  Cardiovascular:     Rate and Rhythm: Normal rate and regular rhythm.  Pulmonary:     Effort: Pulmonary effort is normal.     Breath sounds: Normal breath sounds.  Abdominal:     General: Abdomen is flat.     Palpations: Abdomen is soft.  Musculoskeletal:     Comments: Deep brain stimulator device in the left chest soft tissues.  Neurological:     Mental Status: He is alert.     Imaging: No results found.  Labs:  CBC: Recent Labs    02/07/20 1209 02/08/20 0429 02/21/20 1204 03/11/20 1438  WBC 2.2* 2.6* 3.8* 5.1  HGB 7.6* 8.5* 9.1* 9.3*  HCT 21.9* 24.9* 27.0* 26.9*  PLT 218 245 321 275    COAGS: No  results for input(s): INR, APTT in the last 8760 hours.  BMP: Recent Labs    01/18/20 2225 02/07/20 1209 02/08/20 0429 02/21/20 1204  NA 138 138 139 137  K 3.9 3.7 3.5 4.3  CL 105 105 107 102  CO2 29 25 28 29   GLUCOSE 147* 109* 98 101*  BUN 22 21 13 21   CALCIUM 7.6* 7.6* 8.1* 8.5*  CREATININE 0.71 0.65 0.68 0.73  GFRNONAA >60 >60 >60 >  60  GFRAA >60 >60 >60 >60    LIVER FUNCTION TESTS: Recent Labs    04/07/19 1220 01/18/20 2225 02/21/20 1204  BILITOT 2.1* 1.4* 2.0*  AST 10 9* 14*  ALT 4 <5 5  ALKPHOS 65 42 64  PROT 6.2 5.5* 6.7  ALBUMIN 4.1 3.2* 3.9    TUMOR MARKERS: No results for input(s): AFPTM, CEA, CA199, CHROMGRNA in the last 8760 hours.  Assessment and Plan:  79 year old with anemia and leukopenia.  Request for bone marrow biopsy.  Plan for CT-guided bone marrow biopsy with moderate sedation.  Risks and benefits of CT-guided bone marrow biopsy was discussed with the patient and/or patient's family including, but not limited to bleeding, infection, damage to adjacent structures or low yield requiring additional tests.  All of the questions were answered and there is agreement to proceed.  Consent signed and in chart.    Thank you for this interesting consult.  I greatly enjoyed meeting Casey Tucker and look forward to participating in their care.  A copy of this report was sent to the requesting provider on this date.  Electronically Signed: Burman Riis, MD 03/19/2020, 8:23 AM   I spent a total of  15 Minutes   in face to face in clinical consultation, greater than 50% of which was counseling/coordinating care for bone marrow biopsy.

## 2020-03-21 ENCOUNTER — Other Ambulatory Visit: Payer: Self-pay

## 2020-03-21 ENCOUNTER — Other Ambulatory Visit: Payer: Medicare Other

## 2020-03-21 DIAGNOSIS — M1612 Unilateral primary osteoarthritis, left hip: Secondary | ICD-10-CM | POA: Diagnosis not present

## 2020-03-21 DIAGNOSIS — F0280 Dementia in other diseases classified elsewhere without behavioral disturbance: Secondary | ICD-10-CM | POA: Diagnosis not present

## 2020-03-21 DIAGNOSIS — G20 Parkinson's disease: Secondary | ICD-10-CM | POA: Diagnosis not present

## 2020-03-21 DIAGNOSIS — D638 Anemia in other chronic diseases classified elsewhere: Secondary | ICD-10-CM | POA: Diagnosis not present

## 2020-03-21 DIAGNOSIS — K219 Gastro-esophageal reflux disease without esophagitis: Secondary | ICD-10-CM | POA: Diagnosis not present

## 2020-03-21 DIAGNOSIS — Z515 Encounter for palliative care: Secondary | ICD-10-CM

## 2020-03-21 DIAGNOSIS — I350 Nonrheumatic aortic (valve) stenosis: Secondary | ICD-10-CM | POA: Diagnosis not present

## 2020-03-21 NOTE — Progress Notes (Signed)
COMMUNITY PALLIATIVE CARE SW NOTE  PATIENT NAME: Casey Tucker DOB: May 24, 1941 MRN: [id number]  PRIMARY CARE PROVIDER: [name], MD  RESPONSIBLE PARTY:  Acct ID - Guarantor Home Phone Work Phone Relationship Acct Type  [id number] [name]  Self P/F     [address], [place], [place] [address]     PLAN OF CARE and INTERVENTIONS:             1. GOALS OF CARE/ ADVANCE CARE PLANNING: Patient is Full Code. HCPOA is patients wife Casey Tucker and daughters Casey Tucker. Living Will is complete. Goal is to remain at home and be as independent as possible.  2. SOCIAL/EMOTIONAL/SPIRITUAL ASSESSMENT/ INTERVENTIONS: SW met with patient and Casey Tucker (patient's wife) in the home for scheduled visit. Patients private duty caregiver, Casey Tucker, was present as well. Patient was sitting in recliner in living room. Patient was very alert and talkative this visit. Patient shared that he had just got back from grocery shopping with wife and caregiver. Patient reports that he currently has leg pain due to walking around grocery store, recently took tylenol and gabapentin for pain. Patient said he is sleeping well. Patient noted episodes of incontinence during the night and has briefs and BSC. Patient said his appetite is good and he eats well. Patient has not had any medication changes. Patient has not had any recent falls. Patient and wife shared that patient  Recently had a bone marrow biopsy on 8/31, but does not know the findings. SW outreached medical center for MD to share/explain biopsy findings with patient. Patient has f/u appt with oncologist Dr. Tasia Catchings on 9/15 @2pm . SW provided emotional support, discussed care options and used active and reflective listening. No concerns voices. Will Palliative care will continue to follow. 3. PATIENT/CAREGIVER EDUCATION/ COPING:  Patient was alert, engaged and more talkative during this visit. Patient has flat affect. Patient was calm and pleasant. Patient denies any  coping concerns. Patient seems content with current situation and hopes to maintain. Family continues to be supportive. 4. PERSONAL EMERGENCY PLAN:  Family will call 9-1-1 for emergencies. 5. COMMUNITY RESOURCES COORDINATION/ HEALTH CARE NAVIGATION:  None. Wife and patient has private caregiver that assist with chores around the home, grocery shopping and doctors appointments. 6. FINANCIAL/LEGAL CONCERNS/INTERVENTIONS:  None.     SOCIAL HX:  Social History   Tobacco Use  . Smoking status: Never Smoker  . Smokeless tobacco: Never Used  Substance Use Topics  . Alcohol use: Yes    Alcohol/week: 7.0 standard drinks    Types: 7 Standard drinks or equivalent per week    Comment: occassionally    CODE STATUS: Full Code  ADVANCED DIRECTIVES: Y MOST FORM COMPLETE:  N HOSPICE EDUCATION PROVIDED: N  PPS: Patient is I with ADL's but has wife to assist as needed. Patient has RW and BSC. Wife does meal prep.  Time spent: 40 min      [name], [place]

## 2020-03-23 DIAGNOSIS — G20 Parkinson's disease: Secondary | ICD-10-CM | POA: Diagnosis not present

## 2020-03-23 DIAGNOSIS — I350 Nonrheumatic aortic (valve) stenosis: Secondary | ICD-10-CM | POA: Diagnosis not present

## 2020-03-23 DIAGNOSIS — M1612 Unilateral primary osteoarthritis, left hip: Secondary | ICD-10-CM | POA: Diagnosis not present

## 2020-03-23 DIAGNOSIS — K219 Gastro-esophageal reflux disease without esophagitis: Secondary | ICD-10-CM | POA: Diagnosis not present

## 2020-03-23 DIAGNOSIS — D638 Anemia in other chronic diseases classified elsewhere: Secondary | ICD-10-CM | POA: Diagnosis not present

## 2020-03-23 DIAGNOSIS — F0280 Dementia in other diseases classified elsewhere without behavioral disturbance: Secondary | ICD-10-CM | POA: Diagnosis not present

## 2020-03-26 ENCOUNTER — Encounter (HOSPITAL_COMMUNITY): Payer: Self-pay | Admitting: Oncology

## 2020-03-26 DIAGNOSIS — K219 Gastro-esophageal reflux disease without esophagitis: Secondary | ICD-10-CM | POA: Diagnosis not present

## 2020-03-26 DIAGNOSIS — D638 Anemia in other chronic diseases classified elsewhere: Secondary | ICD-10-CM | POA: Diagnosis not present

## 2020-03-26 DIAGNOSIS — F0280 Dementia in other diseases classified elsewhere without behavioral disturbance: Secondary | ICD-10-CM | POA: Diagnosis not present

## 2020-03-26 DIAGNOSIS — I350 Nonrheumatic aortic (valve) stenosis: Secondary | ICD-10-CM | POA: Diagnosis not present

## 2020-03-26 DIAGNOSIS — G20 Parkinson's disease: Secondary | ICD-10-CM | POA: Diagnosis not present

## 2020-03-26 DIAGNOSIS — M1612 Unilateral primary osteoarthritis, left hip: Secondary | ICD-10-CM | POA: Diagnosis not present

## 2020-03-27 ENCOUNTER — Encounter (HOSPITAL_COMMUNITY): Payer: Self-pay | Admitting: Oncology

## 2020-04-01 ENCOUNTER — Encounter (HOSPITAL_COMMUNITY): Payer: Self-pay | Admitting: Oncology

## 2020-04-01 DIAGNOSIS — K219 Gastro-esophageal reflux disease without esophagitis: Secondary | ICD-10-CM | POA: Diagnosis not present

## 2020-04-01 DIAGNOSIS — G20 Parkinson's disease: Secondary | ICD-10-CM | POA: Diagnosis not present

## 2020-04-01 DIAGNOSIS — F0280 Dementia in other diseases classified elsewhere without behavioral disturbance: Secondary | ICD-10-CM | POA: Diagnosis not present

## 2020-04-01 DIAGNOSIS — M1612 Unilateral primary osteoarthritis, left hip: Secondary | ICD-10-CM | POA: Diagnosis not present

## 2020-04-01 DIAGNOSIS — D638 Anemia in other chronic diseases classified elsewhere: Secondary | ICD-10-CM | POA: Diagnosis not present

## 2020-04-01 DIAGNOSIS — I350 Nonrheumatic aortic (valve) stenosis: Secondary | ICD-10-CM | POA: Diagnosis not present

## 2020-04-02 ENCOUNTER — Telehealth: Payer: Self-pay | Admitting: *Deleted

## 2020-04-02 NOTE — Telephone Encounter (Signed)
Spoke to  pt's wife .

## 2020-04-02 NOTE — Telephone Encounter (Signed)
I would recommend miralax 1 full capful in water daily to prevent constipation He may need 2-3 doses a day for the next couple of days before it will work--then can cut back. If he takes it tid for 2 days and still hasn't gone--he can try the milk of magnesia again

## 2020-04-02 NOTE — Telephone Encounter (Signed)
Message left on [name]'s voice mail that patient has appointment tomorrow and that she can either come with him, but only 1 person allowed or she can be called into visit.

## 2020-04-02 NOTE — Telephone Encounter (Signed)
Patient called stating that he is constipated and has not had a bowel movement in about a week. Patient stated that he took some milk of magnesia this morning. Patient stated that he has not taken Miralax recently. Patient stated that he wants to know what he should take on a daily basis that will allow him to have regular bowel movements. Pharmacy [facility]

## 2020-04-02 NOTE — Telephone Encounter (Signed)
Patient has an appt do discuss BM bx results tomorrow.  She may accompany him to this visit (1 visitor policy) or can be called on the phone during visit.

## 2020-04-02 NOTE — Telephone Encounter (Signed)
Daughter [name] called asking for results of patient BMBx. Please return her call [phone]

## 2020-04-03 ENCOUNTER — Other Ambulatory Visit: Payer: Self-pay

## 2020-04-03 ENCOUNTER — Inpatient Hospital Stay: Payer: Medicare Other | Attending: Oncology | Admitting: Oncology

## 2020-04-03 ENCOUNTER — Encounter: Payer: Self-pay | Admitting: Oncology

## 2020-04-03 VITALS — BP 108/48 | HR 83 | Temp 97.6°F | Resp 18 | Wt 154.0 lb

## 2020-04-03 DIAGNOSIS — Z7189 Other specified counseling: Secondary | ICD-10-CM | POA: Diagnosis not present

## 2020-04-03 DIAGNOSIS — Z79899 Other long term (current) drug therapy: Secondary | ICD-10-CM | POA: Insufficient documentation

## 2020-04-03 DIAGNOSIS — E538 Deficiency of other specified B group vitamins: Secondary | ICD-10-CM | POA: Insufficient documentation

## 2020-04-03 DIAGNOSIS — G20 Parkinson's disease: Secondary | ICD-10-CM | POA: Diagnosis not present

## 2020-04-03 DIAGNOSIS — D469 Myelodysplastic syndrome, unspecified: Secondary | ICD-10-CM | POA: Diagnosis not present

## 2020-04-03 NOTE — Progress Notes (Signed)
Hematology/Oncology  Follow up note [facility] [phone] [phone]   Patient Care Team: [name], MD as PCP - General (Pediatrics) Tucker, Casey Quail, DO as Consulting Physician (Neurology)  REFERRING PROVIDER: [name], MD  CHIEF COMPLAINTS/REASON FOR VISIT:  Follow up  anemia and leukopenia  HISTORY OF PRESENTING ILLNESS:   Casey Tucker is a  79 y.o.  male with PMH listed below was seen in consultation at the request of  [name], MD  for evaluation of anemia and leukopenia  Patient was accompanied by wife today.  Patient's daughter [name] was called per patient's request. 02/08/2020, patient had a CBC showed WBC 2.6, hemoglobin 8.5, MCV 119, platelet count 245. 02/07/2020, hemoglobin 7.6, MCV 121, differential showed neutropenia with ANC 0.9.  Patient reports 20 pound loss in the past 6 to 12 months.  Fatigue and weakness.  Appetite is fair. Wife reports that patient takes naps during the day.  No fever, chills, night sweating. Chronic history of Parkinson's disease on Carbidopa-levodopa, gabapentin for chronic pain. Patient drinks wine occasionally.  Denies smoking history.   INTERVAL HISTORY Casey Tucker is a 79 y.o. male who has above history reviewed by me today presents for follow up visit for management of anemia and leukopenia  Problems and complaints are listed below: Patient had bone marrow biopsy done and presents to discuss results.  Per patient's request, I also called patient's daughter [name] and she is able to hear the entire conversation and participate in discussion. Patient denies no new complaints.  .  Review of Systems  Constitutional: Positive for fatigue and unexpected weight change. Negative for chills and fever.  HENT:   Negative for hearing loss and voice change.   Eyes: Negative for eye problems and icterus.  Respiratory: Negative for chest tightness, cough and shortness of  breath.   Cardiovascular: Negative for chest pain and leg swelling.  Gastrointestinal: Negative for abdominal distention and abdominal pain.  Endocrine: Negative for hot flashes.  Genitourinary: Negative for difficulty urinating, dysuria and frequency.   Musculoskeletal: Negative for arthralgias.  Skin: Negative for itching and rash.  Neurological: Negative for light-headedness and numbness.  Hematological: Negative for adenopathy. Does not bruise/bleed easily.  Psychiatric/Behavioral: Negative for confusion.    MEDICAL HISTORY:  Past Medical History:  Diagnosis Date  . Arthritis    left hip   . GERD (gastroesophageal reflux disease) Pre 2002   Mild  . History of kidney stones   . 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 Parkinson disease ([place]) 2004   Slowly progressive  . Ruptured appendix teens    SURGICAL HISTORY: Past Surgical History:  Procedure Laterality Date  . APPENDECTOMY    . CATARACT EXTRACTION W/ INTRAOCULAR LENS  IMPLANT, BILATERAL  2017  . DEEP BRAIN STIMULATOR PLACEMENT  8/14   L STN  . DOPPLER ECHOCARDIOGRAPHY  01/10/2009   LV NML Mild LVH EF 60-65% aortic sclerosis w/0 stenosis   . JOINT REPLACEMENT     left hip replacement 08/14/11/[place]   . SUBTHALAMIC STIMULATOR BATTERY REPLACEMENT Left 03/25/2017   Procedure: Deep Brain Stimulator battery replacement;  Surgeon: Erline Levine, MD;  Location: [place];  Service: Neurosurgery;  Laterality: Left;  left  . TONSILLECTOMY    . TOTAL HIP REVISION  08/27/2011   Procedure: TOTAL HIP REVISION;  Surgeon: Mauri Pole, MD;  Location: [facility];  Service: Orthopedics;  Laterality: Left;    SOCIAL HISTORY: Social History   Socioeconomic History  . Marital status: Married    Spouse name: Not on file  . Number of children: 2  . Years of education: Not on file  . Highest education level: Doctorate  Occupational History  . Occupation:  Optometrist    Comment: medically retired  Tobacco Use  . Smoking status: Never Smoker  . Smokeless tobacco: Never Used  Vaping Use  . Vaping Use: Never used  Substance and Sexual Activity  . Alcohol use: Yes    Alcohol/week: 7.0 standard drinks    Types: 7 Standard drinks or equivalent per week    Comment: occassionally  . Drug use: No  . Sexual activity: Yes  Other Topics Concern  . Not on file  Social History Narrative   Married, lives with wife   2 daughters   Right handed   Has living will   Wife has health care POA---then daughters   Would still accept CPR--but no prolonged artificial means (ventilator or tube feeds)   Social Determinants of Health   Financial Resource Strain:   . Difficulty of Paying Living Expenses: Not on file  Food Insecurity:   . Worried About Charity fundraiser in the Last Year: Not on file  . Ran Out of Food in the Last Year: Not on file  Transportation Needs:   . Lack of Transportation (Medical): Not on file  . Lack of Transportation (Non-Medical): Not on file  Physical Activity:   . Days of Exercise per Week: Not on file  . Minutes of Exercise per Session: Not on file  Stress:   . Feeling of Stress : Not on file  Social Connections:   . Frequency of Communication with Friends and Family: Not on file  . Frequency of Social Gatherings with Friends and Family: Not on file  . Attends Religious Services: Not on file  . Active Member of Clubs or Organizations: Not on file  . Attends Archivist Meetings: Not on file  . Marital Status: Not on file  Intimate Partner Violence:   . Fear of Current or Ex-Partner: Not on file  . Emotionally Abused: Not on file  . Physically Abused: Not on file  . Sexually Abused: Not on file    FAMILY HISTORY: Family History  Problem Relation Age of Onset  . Arthritis Mother        knee replacement  . COPD Father        emphysema, smoker  . Heart disease Father        CHF  . Healthy Sister    . Alcohol abuse Paternal Uncle   . Rheum arthritis Daughter     ALLERGIES:  is allergic to amoxicillin, celebrex [celecoxib], demerol, and penicillins.  MEDICATIONS:  Current Outpatient Medications  Medication Sig Dispense Refill  . carbidopa-levodopa (SINEMET IR) 25-100 MG tablet TAKE TWO TABLETS AT 7:30AM, ONE AT 10AM,ONE AT 1PM, ONE AT 4PM AND 1 AT 7PM. 540 tablet 0  . gabapentin (NEURONTIN) 100 MG capsule Take 1 capsule (100 mg total) by mouth 3 (three) times daily. And 265m at bedtime (2 capsules) 150 capsule 5  . magnesium hydroxide (MILK OF MAGNESIA) 400 MG/5ML suspension Take 5 mLs by mouth daily as needed for mild constipation.    . polyethylene glycol (MIRALAX / GLYCOLAX) 17 g packet Take 17 g by mouth daily as needed.     . vitamin B-12 (CYANOCOBALAMIN) 1000 MCG tablet Take 1  tablet (1,000 mcg total) by mouth daily. 30 tablet 0  . meloxicam (MOBIC) 15 MG tablet Take 15 mg by mouth daily. (Patient not taking: Reported on 04/03/2020)     No current facility-administered medications for this visit.     PHYSICAL EXAMINATION: ECOG PERFORMANCE STATUS: 2 - Symptomatic, <50% confined to bed Vitals:   04/03/20 1429  BP: (!) 108/48  Pulse: 83  Resp: 18  Temp: 97.6 F (36.4 C)   Filed Weights   04/03/20 1429  Weight: 154 lb (69.9 kg)    Physical Exam Constitutional:      General: He is not in acute distress.    Comments: Patient sits in a wheelchair today.  Thin built  HENT:     Head: Normocephalic and atraumatic.  Eyes:     General: No scleral icterus. Cardiovascular:     Rate and Rhythm: Normal rate and regular rhythm.     Heart sounds: Normal heart sounds.  Pulmonary:     Effort: Pulmonary effort is normal. No respiratory distress.     Breath sounds: No wheezing.  Abdominal:     General: Bowel sounds are normal. There is no distension.     Palpations: Abdomen is soft.  Musculoskeletal:        General: No deformity. Normal range of motion.     Cervical back:  Normal range of motion and neck supple.  Skin:    General: Skin is warm and dry.     Findings: No erythema or rash.  Neurological:     Mental Status: He is alert and oriented to person, place, and time. Mental status is at baseline.     Cranial Nerves: No cranial nerve deficit.     Coordination: Coordination normal.  Psychiatric:        Mood and Affect: Mood normal.     LABORATORY DATA:  I have reviewed the data as listed Lab Results  Component Value Date   WBC 4.0 03/19/2020   HGB 9.5 (L) 03/19/2020   HCT 28.2 (L) 03/19/2020   MCV 120.5 (H) 03/19/2020   PLT 284 03/19/2020   Recent Labs    04/07/19 1220 04/07/19 1220 01/18/20 2225 01/18/20 2225 02/07/20 1209 02/08/20 0429 02/21/20 1204 03/01/20 1142  NA 138   < > 138   < > 138 139 137  --   K 4.4   < > 3.9   < > 3.7 3.5 4.3  --   CL 102   < > 105   < > 105 107 102  --   CO2 30   < > 29   < > 25 28 29   --   GLUCOSE 82   < > 147*   < > 109* 98 101*  --   BUN 17   < > 22   < > 21 13 21   --   CREATININE 0.96   < > 0.71   < > 0.65 0.68 0.73  --   CALCIUM 9.4   < > 7.6*   < > 7.6* 8.1* 8.5*  --   GFRNONAA  --   --  >60   < > >60 >60 >60  --   GFRAA  --   --  >60   < > >60 >60 >60  --   PROT 6.2  --  5.5*  --   --   --  6.7  --   ALBUMIN 4.1  --  3.2*  --   --   --  3.9  --   AST 10  --  9*  --   --   --  14*  --   ALT 4  --  <5  --   --   --  5  --   ALKPHOS 65  --  42  --   --   --  64  --   BILITOT 2.1*  --  1.4*  --   --   --  2.0*  --   BILIDIR  --   --   --   --   --   --   --  0.2   < > = values in this interval not displayed.   Iron/TIBC/Ferritin/ %Sat    Component Value Date/Time   IRON 173 02/21/2020 1204   TIBC 252 02/21/2020 1204   FERRITIN 283 02/21/2020 1204   IRONPCTSAT 69 (H) 02/21/2020 1204      RADIOGRAPHIC STUDIES: I have personally reviewed the radiological images as listed and agreed with the findings in the report. CT BONE MARROW BIOPSY & ASPIRATION  Result Date: 03/19/2020 INDICATION:  79 year old with macrocytic anemia and neutropenia. EXAM: CT GUIDED BONE MARROW ASPIRATES AND BIOPSY Physician: Stephan Minister. Anselm Pancoast, MD MEDICATIONS: None. ANESTHESIA/SEDATION: Fentanyl 25 mcg IV; Versed 1.0 mg IV Moderate Sedation Time:  11 minutes The patient was continuously monitored during the procedure by the interventional radiology nurse under my direct supervision. COMPLICATIONS: None immediate. PROCEDURE: The procedure was explained to the patient. The risks and benefits of the procedure were discussed and the patient's questions were addressed. Informed consent was obtained from the patient. The patient was placed prone on CT table. Images of the pelvis were obtained. The right side of back was prepped and draped in sterile fashion. The skin and right posterior ilium were anesthetized with 1% lidocaine. 11 gauge bone needle was directed into the right ilium with CT guidance. Two aspirates and one core biopsy were obtained. Bandage placed over the puncture site. FINDINGS: Left hip replacement is partially imaged. Atherosclerotic calcifications in the iliac arteries. Biopsy needle directed into the posterior right ilium. Adequate specimens obtained. IMPRESSION: CT guided bone marrow aspiration and core biopsy. Electronically Signed   By: Markus Daft M.D.   On: 03/19/2020 09:47      ASSESSMENT & PLAN:  1. MDS (myelodysplastic syndrome) ([place])    #Chronic macrocytic anemia, reviewed previous blood records.  Patient has been anemia since 2018,  Chronically macrocytic, with MCV progressively increasing since 2009. Discussed with patient about her lab work-up results. Smear showed hyper segmental neutrophilia which may be secondary to B12 deficiency. Vitamin B12 level is in the 500s.  MMA is normal.  Decreased LDH, negative hepatitis and HIV Peripheral blood flow cytometry showed no significant immunophenotypic abnormality. Leukopenia has improved after started on vitamin B12 supplementation. Bone marrow  biopsy results were reviewed and discussed with patient. Biopsy showed primary myelodysplastic syndrome particularly with ring sideroblast (10%).  No increase in blastic cells.  Blast 1%. Cytogenetics normal. MDS FISH panel normal. IPSS-R 1 point from good cytogenetic; 1 point from anemia with hemoglobin less than 10. Patient has low risk MDS with median survival in the absence of therapy is 5.3 years. Currently he does not have symptomatic anemia.  His hemoglobin is at 9.5 which has been his baseline for the past 2 years. I discussed with patient and daughter about the diagnosis, disease nature, possibilities of transformation to acute leukemia.  Both patient and daughter understand that his condition is  not curable.  He is not a transplant candidate due to his age and other comorbidities. Recommend observation for now.  I will check erythropoietin level at the next visit.   #Vitamin B12 deficiency, B12 has improved.  Continue oral vitamin B12 supplementation.  No orders of the defined types were placed in this encounter.   All questions were answered. The patient knows to call the clinic with any problems questions or concerns.  cc [name], MD    Return of visit: 2 weeks after bone marrow biopsy. Thank you for this kind referral and the opportunity to participate in the care of this patient. A copy of today's note is routed to referring provider    Earlie Server, MD, PhD Hematology Oncology [facility] at [facility] Pager- [phone] 04/03/2020

## 2020-04-03 NOTE — Progress Notes (Signed)
Pt here for follow up. No new concerns voiced.   

## 2020-04-04 ENCOUNTER — Other Ambulatory Visit: Payer: Self-pay | Admitting: Oncology

## 2020-04-04 NOTE — Progress Notes (Signed)
tumor °

## 2020-04-05 LAB — SURGICAL PATHOLOGY

## 2020-04-09 DIAGNOSIS — D638 Anemia in other chronic diseases classified elsewhere: Secondary | ICD-10-CM | POA: Diagnosis not present

## 2020-04-09 DIAGNOSIS — K219 Gastro-esophageal reflux disease without esophagitis: Secondary | ICD-10-CM | POA: Diagnosis not present

## 2020-04-09 DIAGNOSIS — F0280 Dementia in other diseases classified elsewhere without behavioral disturbance: Secondary | ICD-10-CM | POA: Diagnosis not present

## 2020-04-09 DIAGNOSIS — I350 Nonrheumatic aortic (valve) stenosis: Secondary | ICD-10-CM | POA: Diagnosis not present

## 2020-04-09 DIAGNOSIS — M1612 Unilateral primary osteoarthritis, left hip: Secondary | ICD-10-CM | POA: Diagnosis not present

## 2020-04-09 DIAGNOSIS — G20 Parkinson's disease: Secondary | ICD-10-CM | POA: Diagnosis not present

## 2020-04-11 DIAGNOSIS — X32XXXA Exposure to sunlight, initial encounter: Secondary | ICD-10-CM | POA: Diagnosis not present

## 2020-04-11 DIAGNOSIS — L570 Actinic keratosis: Secondary | ICD-10-CM | POA: Diagnosis not present

## 2020-04-11 DIAGNOSIS — Z872 Personal history of diseases of the skin and subcutaneous tissue: Secondary | ICD-10-CM | POA: Diagnosis not present

## 2020-04-11 DIAGNOSIS — D2261 Melanocytic nevi of right upper limb, including shoulder: Secondary | ICD-10-CM | POA: Diagnosis not present

## 2020-04-11 DIAGNOSIS — D225 Melanocytic nevi of trunk: Secondary | ICD-10-CM | POA: Diagnosis not present

## 2020-04-11 DIAGNOSIS — Z85828 Personal history of other malignant neoplasm of skin: Secondary | ICD-10-CM | POA: Diagnosis not present

## 2020-04-11 DIAGNOSIS — D2272 Melanocytic nevi of left lower limb, including hip: Secondary | ICD-10-CM | POA: Diagnosis not present

## 2020-04-11 DIAGNOSIS — D2271 Melanocytic nevi of right lower limb, including hip: Secondary | ICD-10-CM | POA: Diagnosis not present

## 2020-04-12 DIAGNOSIS — Z23 Encounter for immunization: Secondary | ICD-10-CM | POA: Diagnosis not present

## 2020-04-22 ENCOUNTER — Other Ambulatory Visit: Payer: Self-pay | Admitting: Neurology

## 2020-04-22 MED ORDER — CARBIDOPA-LEVODOPA 25-100 MG PO TABS: ORAL_TABLET | ORAL | 0 refills | Status: DC

## 2020-04-22 NOTE — Telephone Encounter (Signed)
Rx(s) sent to pharmacy electronically.  

## 2020-05-02 ENCOUNTER — Other Ambulatory Visit: Payer: Self-pay | Admitting: Neurology

## 2020-05-03 ENCOUNTER — Telehealth: Payer: Self-pay

## 2020-05-03 NOTE — Telephone Encounter (Signed)
340 pm.  Phone call made to patient's home.  Call disconnected x 2 and on the 3rd attempt Mrs. Ngo answers.  Patient is doing okay per wife.  He is having good/bad days and they are just taking things one day at a time.  There has been about a 20 lb weight loss in the last 6-12 months.  Patient is eating 2-3 meals a day with snacking at times but the portion sizes are small.  Wife reports her and patient have received the booster [id number]-40 shot in addition to the flu shot.  They both noted an upset stomach as a reaction after the booster shot but this has since resolved.  When I asked about pain she asked me to speak with Mr. Chestnut to get a better understanding of his pain.  Mr. Priebe is reporting leg pain that comes on suddenly and he is rating this a 6-7 on the pain scale.  He is currently taking gabapentin and feels this is beneficial.  He is noted to have one fall in the last month due to loss of balance.  No injuries are reported.  Patient is using his walker at all times.  He continues to reports fatigue and naps throughout the day.  He feels this is more related to his medications.  He is having regular bowel movements and does take milk of magnesia on an as needed basis.  No swelling is reported to his extremities. No recent medication changes reported.  I have re-enforced Palliative Care services and advised to call with changes or should a visit be needed.  Palliative care will follow up again next month for either an in-person visit or telephonic visit based on patient's desire.

## 2020-05-13 ENCOUNTER — Telehealth: Payer: Self-pay

## 2020-05-13 NOTE — Telephone Encounter (Signed)
Pt called to verify appt with Dr Silvio Pate on 05/15/20 at 11 AM. Verified appt on 05/15/20 at 11 AM with Dr Silvio Pate. Pt said nothing further needed.

## 2020-05-15 ENCOUNTER — Other Ambulatory Visit: Payer: Self-pay

## 2020-05-15 ENCOUNTER — Encounter: Payer: Self-pay | Admitting: Internal Medicine

## 2020-05-15 ENCOUNTER — Ambulatory Visit (INDEPENDENT_AMBULATORY_CARE_PROVIDER_SITE_OTHER): Payer: Medicare Other | Admitting: Internal Medicine

## 2020-05-15 DIAGNOSIS — M48062 Spinal stenosis, lumbar region with neurogenic claudication: Secondary | ICD-10-CM

## 2020-05-15 DIAGNOSIS — F0150 Vascular dementia without behavioral disturbance: Secondary | ICD-10-CM | POA: Diagnosis not present

## 2020-05-15 DIAGNOSIS — E441 Mild protein-calorie malnutrition: Secondary | ICD-10-CM | POA: Diagnosis not present

## 2020-05-15 DIAGNOSIS — D469 Myelodysplastic syndrome, unspecified: Secondary | ICD-10-CM

## 2020-05-15 DIAGNOSIS — G20 Parkinson's disease: Secondary | ICD-10-CM | POA: Diagnosis not present

## 2020-05-15 DIAGNOSIS — G20A1 Parkinson's disease without dyskinesia, without mention of fluctuations: Secondary | ICD-10-CM

## 2020-05-15 MED ORDER — GABAPENTIN 100 MG PO CAPS
100.0000 mg | ORAL_CAPSULE | Freq: Three times a day (TID) | ORAL | 0 refills | Status: DC
Start: 2020-05-15 — End: 2020-08-03

## 2020-05-15 NOTE — Progress Notes (Signed)
Subjective:    Patient ID: Casey Tucker, male    DOB: [birth year], 79 y.o.   MRN: [id number]  HPI Here with friend for follow up of multiple medical issues This visit occurred during the SARS-CoV-2 public health emergency.  Safety protocols were in place, including screening questions prior to the visit, additional usage of staff PPE, and extensive cleaning of exam room while observing appropriate contact time as indicated for disinfecting solutions.   Has aide 7 days a week now Usually 4-5 hours a day--but may be less on weekends They handle shopping and "share" the housework He remains independent with ADLs No or rare urinary incontinence  Eating well Weight has stabilized  Has been diagnosed with myelodysplasia No Rx at present  Ongoing leg pain Only tolerates 1-2 gabapentin ----usually 2-3 times a day---more gives side effects  Current Outpatient Medications on File Prior to Visit  Medication Sig Dispense Refill  . carbidopa-levodopa (SINEMET IR) 25-100 MG tablet TAKE TWO TABLETS AT 7:30AM, ONE AT 10AM,ONE AT 1PM, ONE AT 4PM AND ONE AT 7PM 540 tablet 0  . gabapentin (NEURONTIN) 100 MG capsule Take 1 capsule (100 mg total) by mouth 3 (three) times daily. And 200mg  at bedtime (2 capsules) 150 capsule 5  . magnesium hydroxide (MILK OF MAGNESIA) 400 MG/5ML suspension Take 5 mLs by mouth daily as needed for mild constipation.    . polyethylene glycol (MIRALAX / GLYCOLAX) 17 g packet Take 17 g by mouth daily as needed.     . vitamin B-12 (CYANOCOBALAMIN) 1000 MCG tablet Take 1 tablet (1,000 mcg total) by mouth daily. 30 tablet 0  . meloxicam (MOBIC) 15 MG tablet Take 15 mg by mouth daily. (Patient not taking: Reported on 04/03/2020)     No current facility-administered medications on file prior to visit.    Allergies  Allergen Reactions  . Amoxicillin   . Celebrex [Celecoxib]   . Demerol Other (See Comments)    Hallucinations   . Penicillins     REACTION: RASH    Past  Medical History:  Diagnosis Date  . Arthritis    left hip   . GERD (gastroesophageal reflux disease) Pre 2002   Mild  . History of kidney stones   . 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 Parkinson disease ([place]) 2004   Slowly progressive  . Ruptured appendix teens    Past Surgical History:  Procedure Laterality Date  . APPENDECTOMY    . CATARACT EXTRACTION W/ INTRAOCULAR LENS  IMPLANT, BILATERAL  2017  . DEEP BRAIN STIMULATOR PLACEMENT  8/14   L STN  . DOPPLER ECHOCARDIOGRAPHY  01/10/2009   LV NML Mild LVH EF 60-65% aortic sclerosis w/0 stenosis   . JOINT REPLACEMENT     left hip replacement 08/14/11/[place]   . SUBTHALAMIC STIMULATOR BATTERY REPLACEMENT Left 03/25/2017   Procedure: Deep Brain Stimulator battery replacement;  Surgeon: Erline Levine, MD;  Location: [place];  Service: Neurosurgery;  Laterality: Left;  left  . TONSILLECTOMY    . TOTAL HIP REVISION  08/27/2011   Procedure: TOTAL HIP REVISION;  Surgeon: Mauri Pole, MD;  Location[facility];  Service: Orthopedics;  Laterality: Left;    Family History  Problem Relation Age of Onset  . Arthritis Mother        knee replacement  . COPD Father        emphysema, smoker  . Heart disease Father  CHF  . Healthy Sister   . Alcohol abuse Paternal Uncle   . Rheum arthritis Daughter     Social History   Socioeconomic History  . Marital status: Married    Spouse name: Not on file  . Number of children: 2  . Years of education: Not on file  . Highest education level: Doctorate  Occupational History  . Occupation: Optometrist    Comment: medically retired  Tobacco Use  . Smoking status: Never Smoker  . Smokeless tobacco: Never Used  Vaping Use  . Vaping Use: Never used  Substance and Sexual Activity  . Alcohol use: Yes    Alcohol/week: 7.0 standard drinks    Types: 7 Standard drinks or equivalent per week    Comment:  occassionally  . Drug use: No  . Sexual activity: Yes  Other Topics Concern  . Not on file  Social History Narrative   Married, lives with wife   2 daughters   Right handed   Has living will   Wife has health care POA---then daughters   Would still accept CPR--but no prolonged artificial means (ventilator or tube feeds)   Social Determinants of Health   Financial Resource Strain:   . Difficulty of Paying Living Expenses: Not on file  Food Insecurity:   . Worried About Charity fundraiser in the Last Year: Not on file  . Ran Out of Food in the Last Year: Not on file  Transportation Needs:   . Lack of Transportation (Medical): Not on file  . Lack of Transportation (Non-Medical): Not on file  Physical Activity:   . Days of Exercise per Week: Not on file  . Minutes of Exercise per Session: Not on file  Stress:   . Feeling of Stress : Not on file  Social Connections:   . Frequency of Communication with Friends and Family: Not on file  . Frequency of Social Gatherings with Friends and Family: Not on file  . Attends Religious Services: Not on file  . Active Member of Clubs or Organizations: Not on file  . Attends Archivist Meetings: Not on file  . Marital Status: Not on file  Intimate Partner Violence:   . Fear of Current or Ex-Partner: Not on file  . Emotionally Abused: Not on file  . Physically Abused: Not on file  . Sexually Abused: Not on file   Review of Systems Sleeping okay He and wife are satisfied with current arrangement---have adjusted to having daily help    Objective:   Physical Exam Constitutional:      Appearance: Normal appearance.  Cardiovascular:     Rate and Rhythm: Normal rate and regular rhythm.     Heart sounds: No murmur heard.  No gallop.   Pulmonary:     Effort: Pulmonary effort is normal.     Breath sounds: Normal breath sounds. No wheezing or rales.  Abdominal:     Palpations: Abdomen is soft.     Tenderness: There is no  abdominal tenderness.  Musculoskeletal:     Cervical back: Neck supple.  Lymphadenopathy:     Cervical: No cervical adenopathy.  Neurological:     Mental Status: He is alert.     Comments: Speech is not clear Some bradykinesia            Assessment & Plan:

## 2020-05-15 NOTE — Assessment & Plan Note (Signed)
Overall doing better with daily help at home Still independent with ADLs

## 2020-05-15 NOTE — Assessment & Plan Note (Signed)
Weight has now stabilized with the daily aides and better nutrition

## 2020-05-15 NOTE — Assessment & Plan Note (Signed)
Still on the sinemet dosing per Dr Carles Collet Will go back to her in January Still has stimulator

## 2020-05-15 NOTE — Assessment & Plan Note (Signed)
Still with pain Discussed the gabapentin--can adjust dose as needed (though I am concerned about him being able to keep it straight)

## 2020-05-15 NOTE — Assessment & Plan Note (Signed)
Follows with Dr Tasia Catchings Chronic anemia is stable No Rx for now

## 2020-05-22 ENCOUNTER — Telehealth: Payer: Self-pay | Admitting: Neurology

## 2020-05-22 NOTE — Telephone Encounter (Signed)
Patient called to report he is having a lot of pain and is wanting to know if there is something Dr Tat can do or advise to help him out? Please call

## 2020-05-22 NOTE — Telephone Encounter (Signed)
Patient called in and left message returning call.

## 2020-05-22 NOTE — Telephone Encounter (Signed)
Spoke with patient and gave him Dr Doristine Devoid recommendations.   He states he is going to see if he can get a ride to the pain management office in [place] and if he does then he will call back and request the referral.

## 2020-05-22 NOTE — Telephone Encounter (Signed)
Tee, we have addressed with him before.  I don't have anything else unfortunately.  Dr. Silvio Pate is treating him with gabapentin.  We could send him to pain management if he wants but I don't have more to offer for it personally

## 2020-05-28 ENCOUNTER — Telehealth: Payer: Self-pay

## 2020-05-28 NOTE — Telephone Encounter (Signed)
9:46AM: Palliative care SW outreached patient to complete telephonic visit.   Palliative care SW outreached patient to complete telephonic visit. Patient provided update on medical condition and/or changes. Patient shared that overall he feels he has been pretty good. He states that he has been having some pain in his legs and hasn't had much relief from the pain medication he is taking. Patient spoke with neuro office about a pain management referral. Patient shared that he is not sure if he wants the referral yet and needs time to think about. SW offered support with process if warranted. No recent falls. No medication changes, per patient. Patient states that he  eats well and trying to maintain his weight after having a weight loss over the past few months, patient unsure of current weight, per last office visit patient was 152. Patient sleeping well. No other psychosocial needs. Patient appreciative of telephonic check in. Palliative care will continue to monitor and assist with long term care planning as needed.

## 2020-07-03 ENCOUNTER — Telehealth: Payer: Self-pay

## 2020-07-03 NOTE — Telephone Encounter (Signed)
Received a fax from the pharmacy stating " the patient is requesting this medication a month early because he is out of medication. His sister Velva Harman) has expressed concern that he dose may be needs some adjusting and that he has become harder to understand. He also take gabapentin and she is unsure if that is controlling his pain effectively.  Please advise [place] on if there are charges that need to be made to fill his medication."    Left message for patient to ask about medication.

## 2020-07-04 NOTE — Telephone Encounter (Signed)
Left message for patient to contact office on home and mobile number.

## 2020-07-05 NOTE — Telephone Encounter (Signed)
I don't prescribe his gabapentin.  The pharmacy will need to contact prescribing physician with concerns

## 2020-07-05 NOTE — Telephone Encounter (Signed)
Left detailed message informing pharmacy that Dr Tat does not prescribe Gabapentin and for them to contact the prescriber that does. Also informed them that ive tried to contact the patient with no luck. Advised them to have the patient contact the office if he has any questions about his medication.   Advised a call back with any questions or concerns.

## 2020-07-17 ENCOUNTER — Telehealth: Payer: Self-pay

## 2020-07-17 NOTE — Telephone Encounter (Signed)
1040 am. Telephone call made to patient to complete a telephonic visit and schedule an in-person visit for next month.  No answer.  Message has been left on VM requesting a call back.  PLAN:  Awaiting call back.  If no call back,  PC will outreach patient again next month.

## 2020-07-22 ENCOUNTER — Inpatient Hospital Stay: Payer: Medicare Other | Attending: Oncology

## 2020-07-22 ENCOUNTER — Telehealth: Payer: Self-pay | Admitting: Oncology

## 2020-07-22 NOTE — Telephone Encounter (Signed)
Patient left message with OnCall answering service on 07/20/20 at 1:24 pm to cancel appt on 07/24/20.  Schedule message sent for follow up as he also had a lab appointment scheduled for today at 1045.

## 2020-07-24 ENCOUNTER — Other Ambulatory Visit: Payer: Medicare Other

## 2020-07-24 ENCOUNTER — Inpatient Hospital Stay: Payer: Medicare Other | Admitting: Oncology

## 2020-07-25 ENCOUNTER — Ambulatory Visit: Payer: Medicare Other | Admitting: Internal Medicine

## 2020-07-30 NOTE — Progress Notes (Deleted)
Assessment/Plan:   1.  Parkinsons Disease  -Status post left STN DBS at [facility] in August, 2014.  Battery last changed in September, 2018.  Told the patient not to mess with his DBS any longer, as he accidentally turned it off.  Told him he does not need to check it and to just let me do that.  The only time he needs to use his remote is if he is at an office where he needs an EKG. -Continue carbidopa/levodopa 25/100, 2 tablets at 7:30 AM/1 tablet at 10 AM/1 tablet at 1 PM/1 tablet at 4 PM/1 tablet at 7 PM. Can take an extra if needed but not daily. 2.PDD -Patient should not drive.  Meds should be monitored and directly administered to him.  Should be living in step up care. 3.  Syncope             -Needs to follow-up with primary care.  Patient refused admission to the hospital when it happened on July 1.  While this could be related to orthostasis, would be unusual for this to happen while sitting down.  His hemoglobin was only 8.5 in the emergency room (it was only 9.5 in September).  However, his white count also was significantly lower compared to September (9.4 in September and 3.4 in July).  I looked and his next pcp appt is in Jan.  Will contact pcp and see if we can get earlier appt 4. Myelodysplastic syndrome  -Following with hematology/oncology  Subjective:   Casey Tucker was seen today in follow up for Parkinsons disease.  My previous records were reviewed prior to todays visit as well as outside records available to me.  Patient's daughter contacted me after last visit to state that she agreed with me that the patient was not safe in his home and has been [place].  His wife, like the patient, was also confused and often did not know the day of the week.  While the patient's family had set up care in the home, the patient and his wife had dismissed that care. When I last saw the patient, expressed concerned about his low white count and anemia. He has since  been worked up by oncology and was diagnosed with myelodysplastic syndrome.  Current prescribed movement disorder medications: ***Carbidopa/levodopa 25/100, 2 tablets at 7:30 AM/1 tablet at 10 AM/1 tablet at 1 PM/1 tablet at 4 PM/1 tablet at 7 PM(states that he often takes 7 per day)    PREVIOUS MEDICATIONS: entacapone (d/c last visit d/t confusion)  ALLERGIES:   Allergies  Allergen Reactions  . Amoxicillin   . Celebrex [Celecoxib]   . Demerol Other (See Comments)    Hallucinations   . Penicillins     REACTION: RASH    CURRENT MEDICATIONS:  Outpatient Encounter Medications as of 08/05/2020  Medication Sig  . carbidopa-levodopa (SINEMET IR) 25-100 MG tablet TAKE TWO TABLETS AT 7:30AM, ONE AT 10AM,ONE AT 1PM, ONE AT 4PM AND ONE AT 7PM  . gabapentin (NEURONTIN) 100 MG capsule Take 1-2 capsules (100-200 mg total) by mouth 3 (three) times daily.  . magnesium hydroxide (MILK OF MAGNESIA) 400 MG/5ML suspension Take 5 mLs by mouth daily as needed for mild constipation.  . polyethylene glycol (MIRALAX / GLYCOLAX) 17 g packet Take 17 g by mouth daily as needed.   . vitamin B-12 (CYANOCOBALAMIN) 1000 MCG tablet Take 1 tablet (1,000 mcg total) by mouth daily.   No facility-administered encounter medications on file as of 08/05/2020.  Objective:   PHYSICAL EXAMINATION:    VITALS:  There were no vitals filed for this visit.  GEN:  The patient appears stated age and is in NAD. HEENT:  Normocephalic, atraumatic.  The mucous membranes are moist. The superficial temporal arteries are without ropiness or tenderness. CV:  RRR Lungs:  CTAB Neck/HEME:  There are no carotid bruits bilaterally.  Neurological examination:  Orientation: The patient is alert and oriented x3. Cranial nerves: There is good facial symmetry with*** facial hypomimia. The speech is fluent and clear. Soft palate rises symmetrically and there is no tongue deviation. Hearing is intact to conversational tone. Sensation:  Sensation is intact to light touch throughout Motor: Strength is at least antigravity x4.  Movement examination: Tone: There is ***tone in the *** Abnormal movements: *** Coordination:  There is *** decremation with RAM's, *** Gait and Station: The patient has *** difficulty arising out of a deep-seated chair without the use of the hands. The patient's stride length is ***.  The patient has a *** pull test.     I have reviewed and interpreted the following labs independently    Chemistry      Component Value Date/Time   NA 137 02/21/2020 1204   NA 142 08/11/2016 1701   NA 139 08/15/2011 0535   K 4.3 02/21/2020 1204   K 3.8 08/15/2011 0535   CL 102 02/21/2020 1204   CL 101 08/15/2011 0535   CO2 29 02/21/2020 1204   CO2 29 08/15/2011 0535   BUN 21 02/21/2020 1204   BUN 18 08/11/2016 1701   BUN 12 08/15/2011 0535   CREATININE 0.73 02/21/2020 1204   CREATININE 0.73 08/15/2011 0535      Component Value Date/Time   CALCIUM 8.5 (L) 02/21/2020 1204   CALCIUM 7.6 (L) 08/15/2011 0535   ALKPHOS 64 02/21/2020 1204   AST 14 (L) 02/21/2020 1204   ALT 5 02/21/2020 1204   BILITOT 2.0 (H) 02/21/2020 1204   BILITOT 1.2 08/11/2016 1701       Lab Results  Component Value Date   WBC 4.0 03/19/2020   HGB 9.5 (L) 03/19/2020   HCT 28.2 (L) 03/19/2020   MCV 120.5 (H) 03/19/2020   PLT 284 03/19/2020    Lab Results  Component Value Date   TSH 2.682 02/21/2020     Total time spent on today's visit was ***30 minutes, including both face-to-face time and nonface-to-face time.  Time included that spent on review of records (prior notes available to me/labs/imaging if pertinent), discussing treatment and goals, answering patient's questions and coordinating care.  Cc:  [name], MD

## 2020-08-01 NOTE — Progress Notes (Signed)
Assessment/Plan:   1.  Parkinsons Disease  -Status post left STN DBS at [facility] in August, 2014.  Battery last changed in September, 2018.  Told the patient not to mess with his DBS any longer, as he accidentally turned it off.  Told him he does not need to check it and to just let me do that.  reiterated that today.  The only time he needs to use his remote is if he is at an office where he needs an EKG. -Continue carbidopa/levodopa 25/100, 2 tablets at 7:30 AM/1 tablet at 10 AM/1 tablet at 1 PM/1 tablet at 4 PM/1 tablet at 7 PM. Can take an extra if needed but not daily.  Told him no more than 7 tabls per day. 2.PDD -Patient should not drive.  Meds should be monitored and directly administered to him.  Should be living in step up care. 3. Myelodysplastic syndrome  -Following with hematology/oncology 4.  Chronic pain syndrome  -again discussed not related to Parkinsons Disease  -offered pain management referral and declined  -discussed today and many other visits that I don't treat chronic pain  Subjective:   Casey Tucker was seen today in follow up for Parkinsons disease.  My previous records were reviewed prior to todays visit as well as outside records available to me.  Pt with friend/driver today.  Patient's daughter contacted me after last visit to state that she agreed with me that the patient was not safe in his home.  His wife, like the patient, was also confused and often did not know the day of the week.  While the patient's family had set up care in the home, the patient and his wife had dismissed that care. When I last saw the patient, expressed concerned about his low white count and anemia. He has since been worked up by oncology and was diagnosed with myelodysplastic syndrome.  Pt had a fall since last visit.  Cannot tell me details but states didn't get hurt.  This was about 2 months.  He has a caregiver in the home 5 days per week and they may be  there 3-4 hours and sometimes they "pop in."  Pt admits that he sometimes lets the caregiver her.  Current prescribed movement disorder medications: Carbidopa/levodopa 25/100, 2 tablets at 7:30 AM/1 tablet at 10 AM/1 tablet at 1 PM/1 tablet at 4 PM/1 tablet at 7 PM(states that he often takes 7 per day) - he knew this dosing schedule without prompting    PREVIOUS MEDICATIONS: entacapone (d/c last visit d/t confusion)  ALLERGIES:   Allergies  Allergen Reactions  . Amoxicillin   . Celebrex [Celecoxib]   . Demerol Other (See Comments)    Hallucinations   . Penicillins     REACTION: RASH    CURRENT MEDICATIONS:  Outpatient Encounter Medications as of 08/02/2020  Medication Sig  . carbidopa-levodopa (SINEMET IR) 25-100 MG tablet TAKE TWO TABLETS AT 7:30AM, ONE AT 10AM,ONE AT 1PM, ONE AT 4PM AND ONE AT 7PM  . gabapentin (NEURONTIN) 100 MG capsule Take 1-2 capsules (100-200 mg total) by mouth 3 (three) times daily.  . magnesium hydroxide (MILK OF MAGNESIA) 400 MG/5ML suspension Take 5 mLs by mouth daily as needed for mild constipation.  . polyethylene glycol (MIRALAX / GLYCOLAX) 17 g packet Take 17 g by mouth daily as needed.   . vitamin B-12 (CYANOCOBALAMIN) 1000 MCG tablet Take 1 tablet (1,000 mcg total) by mouth daily.   No facility-administered encounter medications on  file as of 08/02/2020.    Objective:   PHYSICAL EXAMINATION:    VITALS:   Vitals:   08/02/20 0914  BP: (!) 104/50  Pulse: (!) 58  SpO2: 99%  Weight: 152 lb (68.9 kg)  Height: 6' (1.829 m)    GEN:  The patient appears stated age and is in NAD. HEENT:  Normocephalic, atraumatic.  The mucous membranes are moist. The superficial temporal arteries are without ropiness or tenderness.  Neurological examination:  Orientation: The patient is alert and oriented to person/place. Cranial nerves: There is good facial symmetry with facial hypomimia. The speech is fluent and dysarthric. Soft palate rises symmetrically  and there is no tongue deviation. Hearing is intact to conversational tone. Sensation: Sensation is intact to light touch throughout Motor: Strength is at least antigravity x4.  Movement examination: Tone: There is mod increased tone in the rue prior to programming Abnormal movements: none Coordination:  There is decremation with RAM's, with any form of RAMS, including alternating supination and pronation of the forearm, hand opening and closing, finger taps, heel taps and toe taps bilaterally Gait and Station: The patient pushes off of the chair to arise.  He has start hesitation.  He freezes in the doorway.  He does well out in the hallway with a walker, but he does festinate.  I have reviewed and interpreted the following labs independently    Chemistry      Component Value Date/Time   NA 137 02/21/2020 1204   NA 142 08/11/2016 1701   NA 139 08/15/2011 0535   K 4.3 02/21/2020 1204   K 3.8 08/15/2011 0535   CL 102 02/21/2020 1204   CL 101 08/15/2011 0535   CO2 29 02/21/2020 1204   CO2 29 08/15/2011 0535   BUN 21 02/21/2020 1204   BUN 18 08/11/2016 1701   BUN 12 08/15/2011 0535   CREATININE 0.73 02/21/2020 1204   CREATININE 0.73 08/15/2011 0535      Component Value Date/Time   CALCIUM 8.5 (L) 02/21/2020 1204   CALCIUM 7.6 (L) 08/15/2011 0535   ALKPHOS 64 02/21/2020 1204   AST 14 (L) 02/21/2020 1204   ALT 5 02/21/2020 1204   BILITOT 2.0 (H) 02/21/2020 1204   BILITOT 1.2 08/11/2016 1701       Lab Results  Component Value Date   WBC 4.0 03/19/2020   HGB 9.5 (L) 03/19/2020   HCT 28.2 (L) 03/19/2020   MCV 120.5 (H) 03/19/2020   PLT 284 03/19/2020    Lab Results  Component Value Date   TSH 2.682 02/21/2020     Total time spent on today's visit was 40 minutes, including both face-to-face time and nonface-to-face time.  Time included that spent on review of records (prior notes available to me/labs/imaging if pertinent), discussing treatment and goals, answering  patient's questions and coordinating care.  This did not include DBS time.  Cc:  [name], MD

## 2020-08-02 ENCOUNTER — Encounter: Payer: Self-pay | Admitting: Neurology

## 2020-08-02 ENCOUNTER — Other Ambulatory Visit: Payer: Self-pay

## 2020-08-02 ENCOUNTER — Ambulatory Visit (INDEPENDENT_AMBULATORY_CARE_PROVIDER_SITE_OTHER): Payer: Medicare Other | Admitting: Neurology

## 2020-08-02 ENCOUNTER — Telehealth: Payer: Self-pay | Admitting: Internal Medicine

## 2020-08-02 VITALS — BP 104/50 | HR 58 | Ht 72.0 in | Wt 152.0 lb

## 2020-08-02 DIAGNOSIS — G894 Chronic pain syndrome: Secondary | ICD-10-CM

## 2020-08-02 DIAGNOSIS — G20 Parkinson's disease: Secondary | ICD-10-CM | POA: Diagnosis not present

## 2020-08-02 MED ORDER — CARBIDOPA-LEVODOPA 25-100 MG PO TABS
ORAL_TABLET | ORAL | 1 refills | Status: DC
Start: 2020-08-02 — End: 2020-10-09

## 2020-08-02 NOTE — Patient Instructions (Signed)
Your physician recommends that you schedule a follow-up appointment in: 6 months with Dr Tat.

## 2020-08-02 NOTE — Telephone Encounter (Signed)
Pt left a message on triage line stating he wanted a referral to see someone for pain. He then left another message on triage line that he wanted to see someone her for pain today. I called back and spoke to pt's wife and advised her that if he was in that much pain he need to go to the ER. She agreed and will let him know.

## 2020-08-02 NOTE — Procedures (Signed)
DBS Programming was performed.    Manufacturer of DBS device: [employer]onic  Total time spent programming was 15 minutes.  Device was on.  Soft start was confirmed to be on.  Impedences were checked and were within normal limits.  Battery was checked and was determined to be functioning normally and not near the end of life.  Final settings were as follows:   Active Contact Amplitude (mA) PW (ms) Frequency (hz) Side Effects Battery  Left Brain        01/29/20        Group A (active) 2-C+ 3.7 60 135  2.88  Group B (not active) 1-C+ 3.2 90 140    08/02/20 2-C+ 3.9 60 140  2.76                                          Right Brain        n/a

## 2020-08-03 ENCOUNTER — Other Ambulatory Visit: Payer: Self-pay | Admitting: Internal Medicine

## 2020-08-04 NOTE — Telephone Encounter (Signed)
Please set him up this week to review the options

## 2020-08-05 ENCOUNTER — Encounter: Payer: Medicare Other | Admitting: Neurology

## 2020-08-06 ENCOUNTER — Ambulatory Visit: Payer: Medicare Other | Admitting: Internal Medicine

## 2020-08-06 ENCOUNTER — Telehealth: Payer: Self-pay

## 2020-08-06 NOTE — Telephone Encounter (Signed)
okay

## 2020-08-06 NOTE — Telephone Encounter (Signed)
10:30AM: Palliative care SW outreached patient/family for monthly telephonic visit.  SW left HIPPA complaint VM. Awaiting return call.  Will continue to offer palliative care support.

## 2020-08-06 NOTE — Telephone Encounter (Signed)
Spoke to pt. He said thank you for the offer. He has been talking to Dr tat and she is working on Pain Management referral for him.

## 2020-08-28 DIAGNOSIS — H35371 Puckering of macula, right eye: Secondary | ICD-10-CM | POA: Diagnosis not present

## 2020-08-28 DIAGNOSIS — Z961 Presence of intraocular lens: Secondary | ICD-10-CM | POA: Diagnosis not present

## 2020-08-28 DIAGNOSIS — H353131 Nonexudative age-related macular degeneration, bilateral, early dry stage: Secondary | ICD-10-CM | POA: Diagnosis not present

## 2020-08-28 DIAGNOSIS — H01009 Unspecified blepharitis unspecified eye, unspecified eyelid: Secondary | ICD-10-CM | POA: Diagnosis not present

## 2020-09-01 IMAGING — CT CT BIOPSY AND ASPIRATION BONE MARROW
1 of 2 series · 11 of 14 positions shown, 14 images · non-contrast
Comparison: none

INDICATION: 79-year-old with macrocytic anemia and neutropenia.

[Series 2: i-spiral 5.0 b30f · axial · 0.67mm/px · z∈[-108,-24]mm · 11 of 30 slices shown, 14 images]
[im 3/30  soft-tissue]
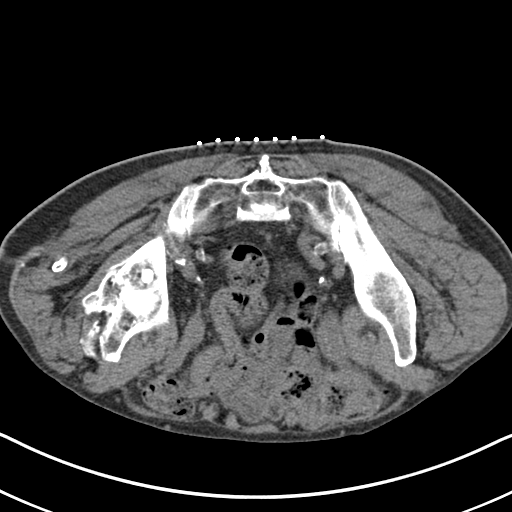
[im 3/30  bone]
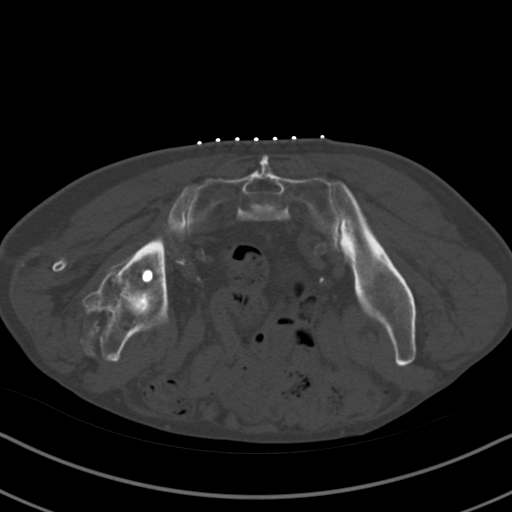
[im 5/30  bone]
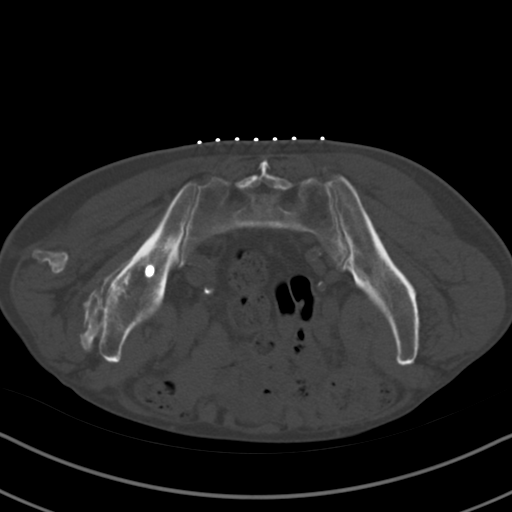
[im 8/30  bone]
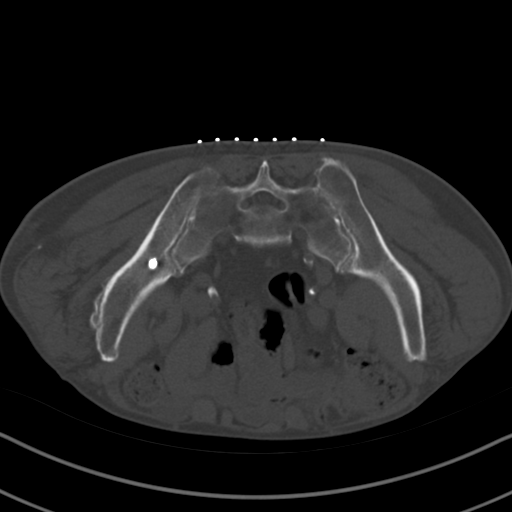
[im 10/30  bone]
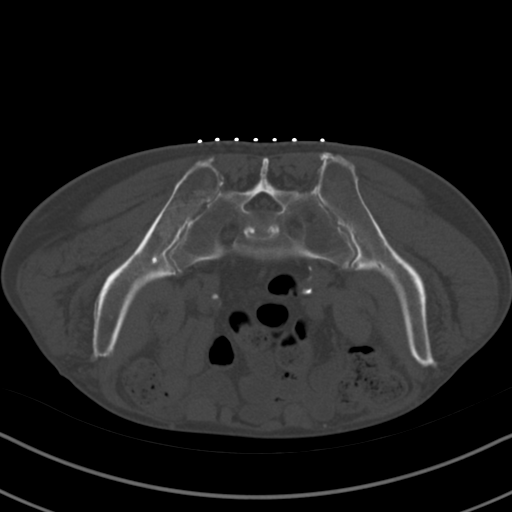
[im 13/30  soft-tissue]
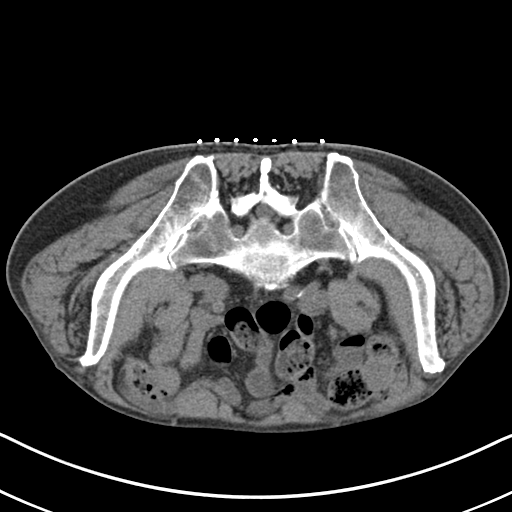
[im 13/30  bone]
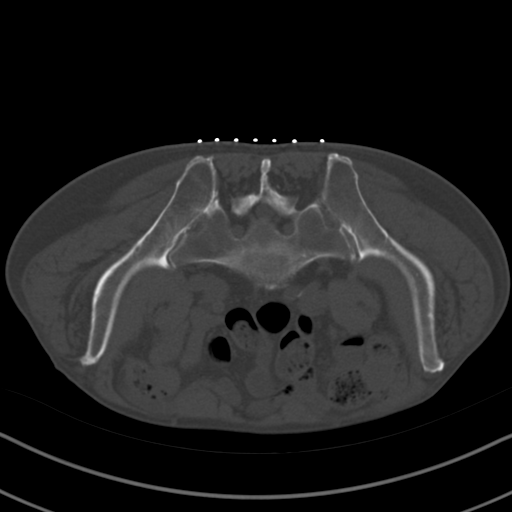
[im 15/30  bone]
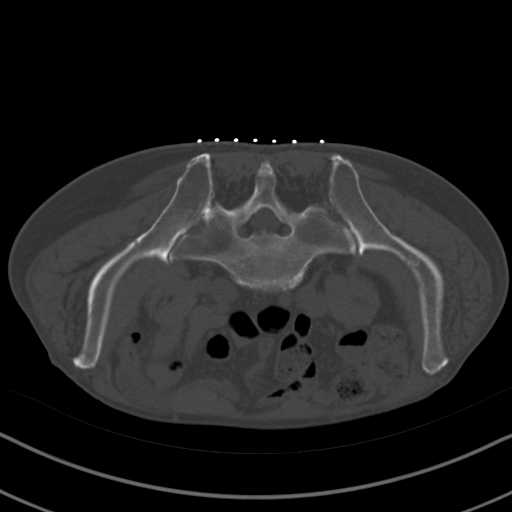
[im 17/30  bone]
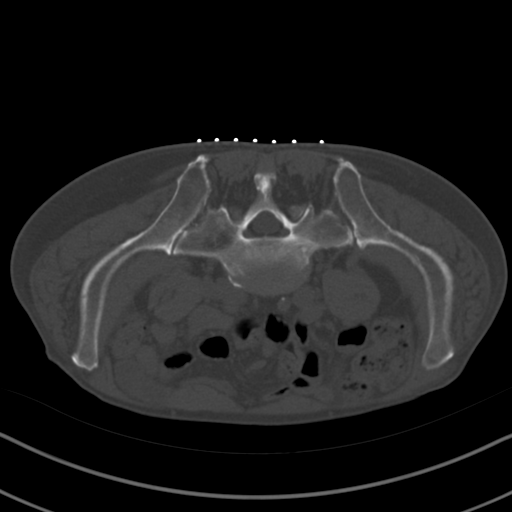
[im 20/30  bone]
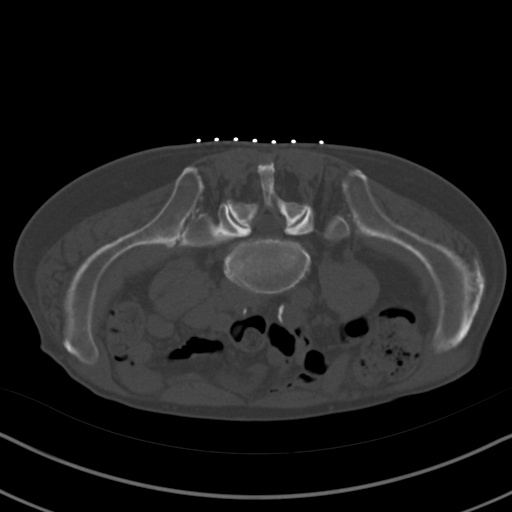
[im 22/30  soft-tissue]
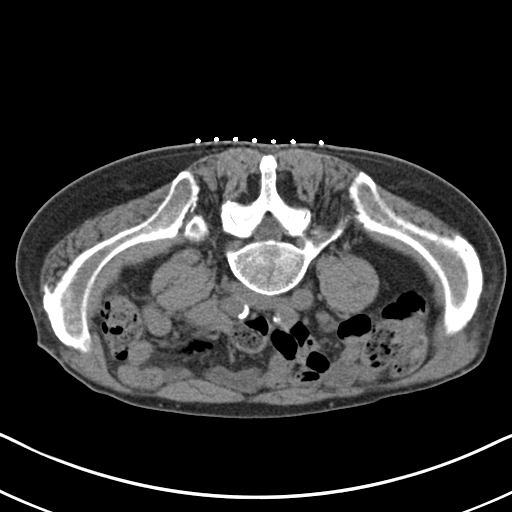
[im 22/30  bone]
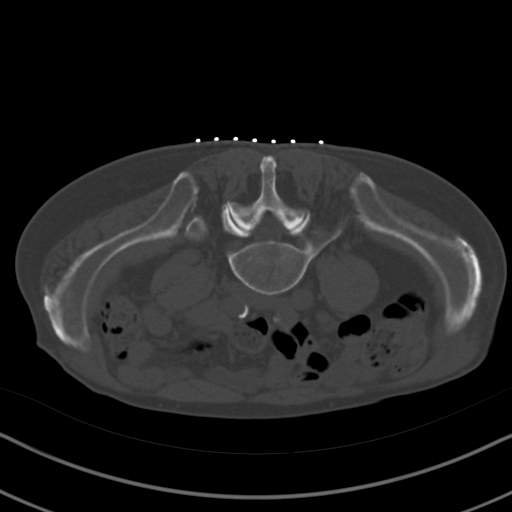
[im 25/30  bone]
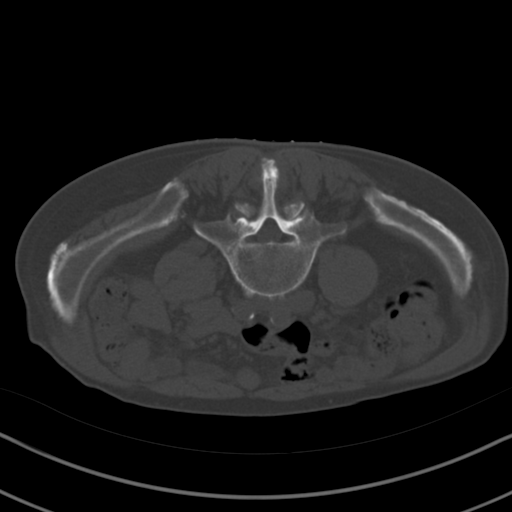
[im 27/30  bone]
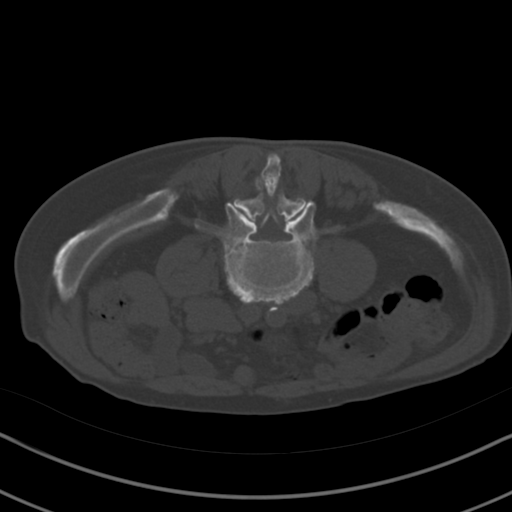

[11 of 14 positions shown; findings below may reference images not displayed]

EXAM:
CT GUIDED BONE MARROW ASPIRATES AND BIOPSY

MEDICATIONS:
None.

ANESTHESIA/SEDATION:
Fentanyl 25 mcg IV; Versed 1.0 mg IV

Moderate Sedation Time:  11 minutes

The patient was continuously monitored during the procedure by the
interventional radiology nurse under my direct supervision.

COMPLICATIONS:
None immediate.

PROCEDURE:
The procedure was explained to the patient. The risks and benefits
of the procedure were discussed and the patient's questions were
addressed. Informed consent was obtained from the patient. The
patient was placed prone on CT table. Images of the pelvis were
obtained. The right side of back was prepped and draped in sterile
fashion. The skin and right posterior ilium were anesthetized with
1% lidocaine. 11 gauge bone needle was directed into the right ilium
with CT guidance. Two aspirates and one core biopsy were obtained.
Bandage placed over the puncture site.
FINDINGS: Left hip replacement is partially imaged. Atherosclerotic
calcifications in the iliac arteries. Biopsy needle directed into
the posterior right ilium. Adequate specimens obtained.
IMPRESSION: CT guided bone marrow aspiration and core biopsy.

## 2020-09-02 ENCOUNTER — Telehealth: Payer: Self-pay | Admitting: *Deleted

## 2020-09-02 NOTE — Telephone Encounter (Signed)
Patient called and left a voicemail which most of the message was hard to understand. Patient did say that he was having a hard time breathing.  Called patient back and got his wife Casey Tucker (on [place]) and was advised that she didn't know that he had called until after the fact. Casey Tucker stated that her husband got himself worked up over several issues and she thought that he may have had an anxiety attack. Casey Tucker stated that they have been getting tax information together and feels that he got a little upset because he is not able to do much about their taxes now. Patient's wife also stated that they had some friends over last night and the husband was hateful to his wife which is unusual for him. Casey Tucker stated that this upset her husband. Casey Tucker stated that she has gotten the mail in and her husband is going thru the mail and has calmed down. Offered to schedule an appointment with Dr. Silvio Pate which his wife declined at this time  Patient's wife stated that her husband is fine right now. Patient's wife stated that she will monitor her husband closely and will call back if things change and she feels that he needs to be seen. Advised patient's wife that this message will go to Dr. Silvio Pate for him to be aware of the situation.

## 2020-09-03 NOTE — Telephone Encounter (Signed)
May want to check back with daughter so she knows what happened.

## 2020-09-03 NOTE — Telephone Encounter (Signed)
Spoke to [place]. She appreciated the call. She was unaware of the issue. They are still declining help in the house even though he tells Dr Silvio Pate different. She is really concerned that he is going to get hurt but feels her hands are tied because both the pt and wife will not accept help in the house or agree to put the patient somewhere where he can be helped.

## 2020-09-03 NOTE — Telephone Encounter (Signed)
Left a message for [name] to call the office or send me a MyChart message.

## 2020-09-04 NOTE — Telephone Encounter (Signed)
Noted. It is a tough situation.

## 2020-09-06 ENCOUNTER — Telehealth: Payer: Self-pay

## 2020-09-06 NOTE — Telephone Encounter (Addendum)
925 am.  Phone call made to patient to complete a telephonic visit.  Spoke with patient's wife [name].  [name] states patient has been doing fairly well.  He continues to be mobile and is using a cane and walker.  She states he is mostly using a walker now.  No falls have been reported.  Patient is eating well at this time.  Wife is agreeable to an in-person visit with the Palliative Care team.  Patient is scheduled to be seen on March 3 at 1 pm.

## 2020-09-19 ENCOUNTER — Other Ambulatory Visit: Payer: Self-pay

## 2020-09-19 ENCOUNTER — Other Ambulatory Visit: Payer: Medicare Other

## 2020-09-19 VITALS — BP 108/50 | HR 61 | Temp 97.9°F

## 2020-09-19 DIAGNOSIS — Z515 Encounter for palliative care: Secondary | ICD-10-CM

## 2020-09-19 NOTE — Progress Notes (Signed)
PATIENT NAME: Casey Tucker DOB: 14-Mar-1941 MRN: [id number]  PRIMARY CARE PROVIDER: [name], MD  RESPONSIBLE PARTY:  Acct ID - Guarantor Home Phone Work Phone Relationship Acct Type  [id number] Casey [phone]  Self P/F     [address], [place], [place] [address]    PLAN OF CARE and INTERVENTIONS:               1.  GOALS OF CARE/ ADVANCE CARE PLANNING:  Remain in his home and independent with the supervision of his wife.               2.  PATIENT/CAREGIVER EDUCATION:  Palliative Care Services.               4. PERSONAL EMERGENCY PLAN:  Activate 911 for emergencies.               5.  DISEASE STATUS:  Visit completed with patient, wife and daughter.  Patient states he is doing fairly well overall.  They have a private caregiver who is coming in 3 x weekly to assist with care.  He continues to use a rolling walker for ambulation.  Patient denies any recent falls.  Appetite has been good and no changes with appetite are noted.  Patient denies any recent weight loss.  Patient reports fluctuations with secretions.  He reports a dry, parched mouth at times and then times where his requires guaifenesin.  Patient is asking for assistance with this.  I have recommended biotene mouthwash to aide in dry mouth and to continue with guaifenesin as needed.    Patient requested additional information on Palliative Care Services.  Explanation was provided to patient.  Patient states he has limited the amount of people coming into his home as this was overwhelming for him and his wife.  He is agreeable to continue with Palliative Care services at this time.   HISTORY OF PRESENT ILLNESS:  80 year old male with Parkinson's disease.  Patient is being followed by Palliative Care monthly and PRN.  CODE STATUS: Full ADVANCED DIRECTIVES: Yes  MOST FORM: No PPS: 50%   PHYSICAL EXAM:   VITALS: Today's Vitals   09/19/20 1303  Pulse: 61  Temp: 97.9 F (36.6 C)  SpO2: 95%    LUNGS:  CTA-no shortness of breath noted. CARDIAC: HRR EXTREMITIES: no edema SKIN: warm and dry to touch.  No skin breakdown reported. NEURO: alert and oriented.       Casey Burton, RN

## 2020-09-27 ENCOUNTER — Telehealth: Payer: Self-pay

## 2020-09-27 NOTE — Telephone Encounter (Signed)
Spoke with caregiver with patient near by she said the patient changed the batteries in the remote and the monitor says ok.   Dr Tat made aware.

## 2020-09-27 NOTE — Telephone Encounter (Signed)
I don't know what this means.  His DBS should not be out of battery yet I don't think so not sure what he means by "failed."

## 2020-09-27 NOTE — Telephone Encounter (Signed)
Spoke with patient and advised him to change the batteries in his remote. He states he will do that and call me back.

## 2020-09-27 NOTE — Telephone Encounter (Signed)
Spoke with patient who states his DBS says failed this morning. Patient states he is making Dr Tat aware and wants to know what does he need to do.

## 2020-10-02 ENCOUNTER — Inpatient Hospital Stay
Admission: EM | Admit: 2020-10-02 | Discharge: 2020-10-06 | DRG: 177 | Disposition: A | Discharge status: Skilled Nursing Facility | Payer: Medicare Other | Attending: Family Medicine | Admitting: Family Medicine

## 2020-10-02 ENCOUNTER — Emergency Department: Payer: Medicare Other

## 2020-10-02 ENCOUNTER — Observation Stay: Payer: Medicare Other

## 2020-10-02 DIAGNOSIS — D469 Myelodysplastic syndrome, unspecified: Secondary | ICD-10-CM | POA: Diagnosis present

## 2020-10-02 DIAGNOSIS — F0150 Vascular dementia without behavioral disturbance: Secondary | ICD-10-CM | POA: Diagnosis not present

## 2020-10-02 DIAGNOSIS — R42 Dizziness and giddiness: Secondary | ICD-10-CM | POA: Diagnosis not present

## 2020-10-02 DIAGNOSIS — Z880 Allergy status to penicillin: Secondary | ICD-10-CM

## 2020-10-02 DIAGNOSIS — R0902 Hypoxemia: Secondary | ICD-10-CM | POA: Diagnosis not present

## 2020-10-02 DIAGNOSIS — Z888 Allergy status to other drugs, medicaments and biological substances status: Secondary | ICD-10-CM

## 2020-10-02 DIAGNOSIS — R4182 Altered mental status, unspecified: Secondary | ICD-10-CM | POA: Diagnosis present

## 2020-10-02 DIAGNOSIS — J189 Pneumonia, unspecified organism: Secondary | ICD-10-CM | POA: Diagnosis present

## 2020-10-02 DIAGNOSIS — G20 Parkinson's disease: Secondary | ICD-10-CM | POA: Diagnosis present

## 2020-10-02 DIAGNOSIS — R55 Syncope and collapse: Secondary | ICD-10-CM | POA: Diagnosis present

## 2020-10-02 DIAGNOSIS — R1310 Dysphagia, unspecified: Secondary | ICD-10-CM | POA: Diagnosis present

## 2020-10-02 DIAGNOSIS — Z96642 Presence of left artificial hip joint: Secondary | ICD-10-CM | POA: Diagnosis present

## 2020-10-02 DIAGNOSIS — E43 Unspecified severe protein-calorie malnutrition: Secondary | ICD-10-CM | POA: Diagnosis present

## 2020-10-02 DIAGNOSIS — K8020 Calculus of gallbladder without cholecystitis without obstruction: Secondary | ICD-10-CM | POA: Diagnosis not present

## 2020-10-02 DIAGNOSIS — G629 Polyneuropathy, unspecified: Secondary | ICD-10-CM | POA: Diagnosis present

## 2020-10-02 DIAGNOSIS — D638 Anemia in other chronic diseases classified elsewhere: Secondary | ICD-10-CM | POA: Diagnosis present

## 2020-10-02 DIAGNOSIS — K5909 Other constipation: Secondary | ICD-10-CM | POA: Diagnosis not present

## 2020-10-02 DIAGNOSIS — G8929 Other chronic pain: Secondary | ICD-10-CM | POA: Diagnosis present

## 2020-10-02 DIAGNOSIS — R32 Unspecified urinary incontinence: Secondary | ICD-10-CM | POA: Diagnosis not present

## 2020-10-02 DIAGNOSIS — Z8261 Family history of arthritis: Secondary | ICD-10-CM

## 2020-10-02 DIAGNOSIS — D649 Anemia, unspecified: Secondary | ICD-10-CM | POA: Diagnosis not present

## 2020-10-02 DIAGNOSIS — K219 Gastro-esophageal reflux disease without esophagitis: Secondary | ICD-10-CM | POA: Diagnosis present

## 2020-10-02 DIAGNOSIS — N132 Hydronephrosis with renal and ureteral calculous obstruction: Secondary | ICD-10-CM | POA: Diagnosis not present

## 2020-10-02 DIAGNOSIS — Z23 Encounter for immunization: Secondary | ICD-10-CM | POA: Diagnosis not present

## 2020-10-02 DIAGNOSIS — R4020 Unspecified coma: Secondary | ICD-10-CM | POA: Diagnosis not present

## 2020-10-02 DIAGNOSIS — I959 Hypotension, unspecified: Secondary | ICD-10-CM | POA: Diagnosis not present

## 2020-10-02 DIAGNOSIS — D72829 Elevated white blood cell count, unspecified: Secondary | ICD-10-CM | POA: Diagnosis not present

## 2020-10-02 DIAGNOSIS — K5900 Constipation, unspecified: Secondary | ICD-10-CM | POA: Diagnosis not present

## 2020-10-02 DIAGNOSIS — N138 Other obstructive and reflux uropathy: Secondary | ICD-10-CM | POA: Diagnosis not present

## 2020-10-02 DIAGNOSIS — R531 Weakness: Secondary | ICD-10-CM | POA: Diagnosis not present

## 2020-10-02 DIAGNOSIS — Z825 Family history of asthma and other chronic lower respiratory diseases: Secondary | ICD-10-CM

## 2020-10-02 DIAGNOSIS — I6523 Occlusion and stenosis of bilateral carotid arteries: Secondary | ICD-10-CM | POA: Diagnosis not present

## 2020-10-02 DIAGNOSIS — J690 Pneumonitis due to inhalation of food and vomit: Principal | ICD-10-CM | POA: Diagnosis present

## 2020-10-02 DIAGNOSIS — Z20822 Contact with and (suspected) exposure to covid-19: Secondary | ICD-10-CM | POA: Diagnosis not present

## 2020-10-02 DIAGNOSIS — Z6820 Body mass index (BMI) 20.0-20.9, adult: Secondary | ICD-10-CM

## 2020-10-02 DIAGNOSIS — Z8249 Family history of ischemic heart disease and other diseases of the circulatory system: Secondary | ICD-10-CM

## 2020-10-02 LAB — CBC WITH DIFFERENTIAL/PLATELET
Abs Immature Granulocytes: 0.17 10*3/uL — ABNORMAL HIGH (ref 0.00–0.07)
Basophils Absolute: 0.1 10*3/uL (ref 0.0–0.1)
Basophils Relative: 0 %
Eosinophils Absolute: 0 10*3/uL (ref 0.0–0.5)
Eosinophils Relative: 0 %
HCT: 23.2 % — ABNORMAL LOW (ref 39.0–52.0)
Hemoglobin: 7.7 g/dL — ABNORMAL LOW (ref 13.0–17.0)
Immature Granulocytes: 1 %
Lymphocytes Relative: 3 %
Lymphs Abs: 0.6 10*3/uL — ABNORMAL LOW (ref 0.7–4.0)
MCH: 39.1 pg — ABNORMAL HIGH (ref 26.0–34.0)
MCHC: 33.2 g/dL (ref 30.0–36.0)
MCV: 117.8 fL — ABNORMAL HIGH (ref 80.0–100.0)
Monocytes Absolute: 1 10*3/uL (ref 0.1–1.0)
Monocytes Relative: 4 %
Neutro Abs: 20.7 10*3/uL — ABNORMAL HIGH (ref 1.7–7.7)
Neutrophils Relative %: 92 %
Platelets: 285 10*3/uL (ref 150–400)
RBC: 1.97 MIL/uL — ABNORMAL LOW (ref 4.22–5.81)
RDW: 14.6 % (ref 11.5–15.5)
WBC: 22.5 10*3/uL — ABNORMAL HIGH (ref 4.0–10.5)
nRBC: 0 % (ref 0.0–0.2)

## 2020-10-02 LAB — URINALYSIS, COMPLETE (UACMP) WITH MICROSCOPIC
Bacteria, UA: NONE SEEN
Bilirubin Urine: NEGATIVE
Glucose, UA: NEGATIVE mg/dL
Hgb urine dipstick: NEGATIVE
Ketones, ur: 20 mg/dL — AB
Leukocytes,Ua: NEGATIVE
Nitrite: NEGATIVE
Protein, ur: NEGATIVE mg/dL
Specific Gravity, Urine: 1.014 (ref 1.005–1.030)
Squamous Epithelial / HPF: NONE SEEN (ref 0–5)
pH: 7 (ref 5.0–8.0)

## 2020-10-02 LAB — COMPREHENSIVE METABOLIC PANEL
ALT: <5 U/L (ref 0–44)
AST: 8 U/L — ABNORMAL LOW (ref 15–41)
Albumin: 3.5 g/dL (ref 3.5–5.0)
Alkaline Phosphatase: 51 U/L (ref 38–126)
Anion gap: 6 (ref 5–15)
BUN: 23 mg/dL (ref 8–23)
CO2: 27 mmol/L (ref 22–32)
Calcium: 8.3 mg/dL — ABNORMAL LOW (ref 8.9–10.3)
Chloride: 101 mmol/L (ref 98–111)
Creatinine, Ser: 0.93 mg/dL (ref 0.61–1.24)
GFR, Estimated: >60 mL/min (ref >60)
Glucose, Bld: 119 mg/dL — ABNORMAL HIGH (ref 70–99)
Potassium: 4.3 mmol/L (ref 3.5–5.1)
Sodium: 134 mmol/L — ABNORMAL LOW (ref 135–145)
Total Bilirubin: 2 mg/dL — ABNORMAL HIGH (ref 0.3–1.2)
Total Protein: 5.8 g/dL — ABNORMAL LOW (ref 6.5–8.1)

## 2020-10-02 LAB — TSH: TSH: 2.899 u[IU]/mL (ref 0.350–4.500)

## 2020-10-02 LAB — RESP PANEL BY RT-PCR (FLU A&B, COVID) ARPGX2
Influenza A by PCR: NEGATIVE
Influenza B by PCR: NEGATIVE
SARS Coronavirus 2 by RT PCR: NEGATIVE

## 2020-10-02 LAB — LACTIC ACID, PLASMA
Lactic Acid, Venous: 1.3 mmol/L (ref 0.5–1.9)
Lactic Acid, Venous: 1.2 mmol/L (ref 0.5–1.9)

## 2020-10-02 LAB — D-DIMER, QUANTITATIVE: D-Dimer, Quant: 0.5 ug/mL-FEU (ref 0.00–0.50)

## 2020-10-02 LAB — IRON AND TIBC
Iron: 17 ug/dL — ABNORMAL LOW (ref 45–182)
Saturation Ratios: 7 % — ABNORMAL LOW (ref 17.9–39.5)
TIBC: 244 ug/dL — ABNORMAL LOW (ref 250–450)
UIBC: 227 ug/dL

## 2020-10-02 LAB — T4, FREE: Free T4: 0.89 ng/dL (ref 0.61–1.12)

## 2020-10-02 LAB — PROTIME-INR
INR: 1.2 (ref 0.8–1.2)
Prothrombin Time: 14.5 seconds (ref 11.4–15.2)

## 2020-10-02 LAB — FERRITIN: Ferritin: 280 ng/mL (ref 24–336)

## 2020-10-02 LAB — CK: Total CK: 24 U/L — ABNORMAL LOW (ref 49–397)

## 2020-10-02 LAB — RETICULOCYTES
Immature Retic Fract: 17.2 % — ABNORMAL HIGH (ref 2.3–15.9)
RBC.: 1.98 MIL/uL — ABNORMAL LOW (ref 4.22–5.81)
Retic Count, Absolute: 38.6 10*3/uL (ref 19.0–186.0)
Retic Ct Pct: 2 % (ref 0.4–3.1)

## 2020-10-02 LAB — TROPONIN I (HIGH SENSITIVITY)
Troponin I (High Sensitivity): 4 ng/L (ref <18)
Troponin I (High Sensitivity): 4 ng/L (ref <18)

## 2020-10-02 LAB — MAGNESIUM: Magnesium: 2.2 mg/dL (ref 1.7–2.4)

## 2020-10-02 LAB — PHOSPHORUS: Phosphorus: 3 mg/dL (ref 2.5–4.6)

## 2020-10-02 LAB — PROCALCITONIN: Procalcitonin: 0.75 ng/mL

## 2020-10-02 LAB — FOLATE: Folate: 8.7 ng/mL (ref >5.9)

## 2020-10-02 LAB — TYPE AND SCREEN

## 2020-10-02 LAB — APTT: aPTT: 31 seconds (ref 24–36)

## 2020-10-02 MED ORDER — CARBIDOPA-LEVODOPA 25-100 MG PO TABS
1.0000 | ORAL_TABLET | Freq: Every day | ORAL | Status: DC
  Administered 2020-10-03 – 2020-10-06 (×4): 1 via ORAL
  Filled 2020-10-02 (×5): qty 1

## 2020-10-02 MED ORDER — HYDROCODONE-ACETAMINOPHEN 5-325 MG PO TABS
1.0000 | ORAL_TABLET | ORAL | Status: DC | PRN
  Administered 2020-10-03 (×2): 1 via ORAL
  Administered 2020-10-04 (×3): 2 via ORAL
  Administered 2020-10-04 – 2020-10-05 (×3): 1 via ORAL
  Administered 2020-10-06: 2 via ORAL
  Administered 2020-10-06: 19:00:00 1 via ORAL
  Filled 2020-10-02 (×2): qty 1
  Filled 2020-10-02 (×3): qty 2
  Filled 2020-10-02: qty 1
  Filled 2020-10-02: qty 2
  Filled 2020-10-02: qty 1
  Filled 2020-10-02: qty 2

## 2020-10-02 MED ORDER — IOHEXOL 300 MG/ML  SOLN
100.0000 mL | Freq: Once | INTRAMUSCULAR | Status: AC | PRN
  Administered 2020-10-02: 100 mL via INTRAVENOUS

## 2020-10-02 MED ORDER — GABAPENTIN 100 MG PO CAPS
100.0000 mg | ORAL_CAPSULE | Freq: Three times a day (TID) | ORAL | Status: DC
  Administered 2020-10-02 – 2020-10-03 (×2): 100 mg via ORAL
  Administered 2020-10-03: 22:00:00 200 mg via ORAL
  Administered 2020-10-03: 100 mg via ORAL
  Administered 2020-10-04 (×3): 200 mg via ORAL
  Administered 2020-10-05 (×2): 100 mg via ORAL
  Administered 2020-10-05 – 2020-10-06 (×3): 200 mg via ORAL
  Filled 2020-10-02 (×4): qty 2
  Filled 2020-10-02 (×2): qty 1
  Filled 2020-10-02: qty 2
  Filled 2020-10-02 (×2): qty 1
  Filled 2020-10-02: qty 2
  Filled 2020-10-02: qty 1
  Filled 2020-10-02: qty 2

## 2020-10-02 MED ORDER — METRONIDAZOLE IN NACL 5-0.79 MG/ML-% IV SOLN
500.0000 mg | Freq: Once | INTRAVENOUS | Status: AC
  Administered 2020-10-02: 500 mg via INTRAVENOUS
  Filled 2020-10-02: qty 100

## 2020-10-02 MED ORDER — SODIUM CHLORIDE 0.9 % IV SOLN
2.0000 g | Freq: Once | INTRAVENOUS | Status: AC
  Administered 2020-10-02: 2 g via INTRAVENOUS
  Filled 2020-10-02: qty 2

## 2020-10-02 MED ORDER — VANCOMYCIN HCL IN DEXTROSE 1-5 GM/200ML-% IV SOLN: 1000.0000 mg | Freq: Once | INTRAVENOUS | Status: DC

## 2020-10-02 MED ORDER — SODIUM CHLORIDE 0.9 % IV SOLN
75.0000 mL/h | INTRAVENOUS | Status: DC
  Administered 2020-10-02: 75 mL/h via INTRAVENOUS

## 2020-10-02 MED ORDER — ACETAMINOPHEN 650 MG RE SUPP: 650.0000 mg | Freq: Four times a day (QID) | RECTAL | Status: DC | PRN

## 2020-10-02 MED ORDER — SODIUM CHLORIDE 0.9 % IV BOLUS
500.0000 mL | Freq: Once | INTRAVENOUS | Status: AC
  Administered 2020-10-02: 500 mL via INTRAVENOUS

## 2020-10-02 MED ORDER — ACETAMINOPHEN 325 MG PO TABS: 650.0000 mg | ORAL_TABLET | Freq: Four times a day (QID) | ORAL | Status: DC | PRN

## 2020-10-02 MED ORDER — VANCOMYCIN HCL 1750 MG/350ML IV SOLN
1750.0000 mg | Freq: Once | INTRAVENOUS | Status: AC
  Administered 2020-10-02: 1750 mg via INTRAVENOUS
  Filled 2020-10-02: qty 350

## 2020-10-02 MED ORDER — CARBIDOPA-LEVODOPA 25-100 MG PO TABS
1.0000 | ORAL_TABLET | Freq: Every day | ORAL | Status: DC
  Filled 2020-10-02 (×3): qty 2

## 2020-10-02 MED ORDER — SODIUM CHLORIDE 0.9 % IV SOLN
500.0000 mg | INTRAVENOUS | Status: DC
  Administered 2020-10-02 – 2020-10-03 (×2): 500 mg via INTRAVENOUS
  Filled 2020-10-02 (×3): qty 500

## 2020-10-02 MED ORDER — IOHEXOL 9 MG/ML PO SOLN
500.0000 mL | ORAL | Status: AC
  Administered 2020-10-02 (×2): 500 mL via ORAL

## 2020-10-02 MED ORDER — SODIUM CHLORIDE 0.9 % IV SOLN
2.0000 g | INTRAVENOUS | Status: DC
  Administered 2020-10-03 – 2020-10-04 (×2): 2 g via INTRAVENOUS
  Filled 2020-10-02 (×3): qty 20

## 2020-10-02 MED ORDER — CARBIDOPA-LEVODOPA 25-100 MG PO TABS
1.0000 | ORAL_TABLET | Freq: Every day | ORAL | Status: DC
  Administered 2020-10-02 – 2020-10-06 (×20): 1 via ORAL
  Filled 2020-10-02 (×22): qty 1

## 2020-10-02 NOTE — H&P (Addendum)
Casey Tucker MEQ:[id number] DOB: 1941/04/08 DOA: 10/02/2020     PCP: [name], MD   Outpatient Specialists:    NEurology   Dr. Carles Collet    Oncology   Dr. Tasia Catchings   Patient arrived to ER on 10/02/20 at 1704 Referred by Attending Vanessa Monetta, MD   Patient coming from: home Lives  With family    Chief Complaint:   Chief Complaint  Patient presents with  . Dizziness    HPI: Casey Tucker is a 80 y.o. male with medical history significant of Parkinson sp  left STN DBS at [facility] in August, 2014., Myelodysplastic syndrome, GERD    Presented with unresponsive episode found by his wife with his caregiver was unresponsive EMS but then became more alert. Family states he is not drinking well, trys to restrict himself due to limited mobility Patient did not endorse any associated chest pain or shortness of breath no fevers or chills. Neurologically appeared to be intact At baseline walks with a walker Patient apparently had a recent fall hitting his right knee. Family did not notice any black stools  Infectious risk factors:  Reports none    Has  been vaccinated against COVID  and boosted   Initial COVID TEST   in house  PCR testing  Pending  Lab Results  Component Value Date   [place] NEGATIVE 02/07/2020     Regarding pertinent Chronic problems:   Parkinson on carbidopa/levodopa sp STN DBS     Chronic anemia - baseline hg Hemoglobin & Hematocrit  Recent Labs    03/11/20 1438 03/19/20 0749 10/02/20 1711  HGB 9.3* 9.5* 7.7*    While in ER: Noted to be initially hypotensive 99/49 elevated white blood cell count up to 22.5 anemic with hemoglobin of 7.7 Blood cultures were obtained and he was started on cefepime and vancomycin    ED Triage Vitals [10/02/20 1707]  Enc Vitals Group     BP (!) 99/49     Pulse Rate 63     Resp 19     Temp 98 F (36.7 C)     Temp Source Oral     SpO2 99 %     Weight      Height      Head Circumference      Peak Flow       Pain Score      Pain Loc      Pain Edu?      Excl. in [place]?   [facility](89)@     _________________________________________ Significant initial  Findings: Abnormal Labs Reviewed  CBC WITH DIFFERENTIAL/PLATELET - Abnormal; Notable for the following components:      Result Value   WBC 22.5 (*)    RBC 1.97 (*)    Hemoglobin 7.7 (*)    HCT 23.2 (*)    MCV 117.8 (*)    MCH 39.1 (*)    Neutro Abs 20.7 (*)    Lymphs Abs 0.6 (*)    Abs Immature Granulocytes 0.17 (*)    All other components within normal limits  COMPREHENSIVE METABOLIC PANEL - Abnormal; Notable for the following components:   Sodium 134 (*)    Glucose, Bld 119 (*)    Calcium 8.3 (*)    Total Protein 5.8 (*)    AST 8 (*)    Total Bilirubin 2.0 (*)    All other components within normal limits   ____________________________________________ Ordered CT HEAD  NON acute  CXR -  NON acute   ___________________ Troponin 4 ECG: Ordered Personally reviewed by me showing: HR : 73 Rhythm:  NSR,  Significant artifact     The recent clinical data is shown below. Vitals:   10/02/20 1707 10/02/20 1800  BP: (!) 99/49 122/62  Pulse: 63 (!) 135  Resp: 19 19  Temp: 98 F (36.7 C)   TempSrc: Oral   SpO2: 99% 100%     WBC     Component Value Date/Time   WBC 22.5 (H) 10/02/2020 1711   LYMPHSABS 0.6 (L) 10/02/2020 1711   LYMPHSABS 1.3 08/11/2016 1701   MONOABS 1.0 10/02/2020 1711   EOSABS 0.0 10/02/2020 1711   EOSABS 0.4 08/11/2016 1701   BASOSABS 0.1 10/02/2020 1711   BASOSABS 0.0 08/11/2016 1701     Lactic Acid, Venous    Component Value Date/Time   LATICACIDVEN 1.3 10/02/2020 1711     Procalcitonin 0.71     UA  no evidence of UTI    Urine analysis:    Component Value Date/Time   COLORURINE YELLOW (A) 10/02/2020 1916   APPEARANCEUR CLEAR (A) 10/02/2020 1916   APPEARANCEUR Clear 08/12/2018 1412   LABSPEC 1.014 10/02/2020 1916   LABSPEC 1.027 08/13/2011 1127   PHURINE 7.0 10/02/2020 1916   GLUCOSEU  NEGATIVE 10/02/2020 1916   GLUCOSEU Negative 08/13/2011 1127   HGBUR NEGATIVE 10/02/2020 1916   BILIRUBINUR NEGATIVE 10/02/2020 1916   BILIRUBINUR Negative 08/12/2018 1412   BILIRUBINUR Negative 08/13/2011 1127   KETONESUR 20 (A) 10/02/2020 1916   PROTEINUR NEGATIVE 10/02/2020 1916   UROBILINOGEN 0.2 01/21/2018 1049   NITRITE NEGATIVE 10/02/2020 1916   LEUKOCYTESUR NEGATIVE 10/02/2020 1916   LEUKOCYTESUR Negative 08/13/2011 1127    Results for orders placed or performed during the hospital encounter of 10/02/20  Resp Panel by RT-PCR (Flu A&B, Covid) Nasopharyngeal Swab     Status: None   Collection Time: 10/02/20  7:16 PM   Specimen: Nasopharyngeal Swab; Nasopharyngeal(NP) swabs in vial transport medium  Result Value Ref Range Status   SARS Coronavirus 2 by RT PCR NEGATIVE NEGATIVE Final         Influenza A by PCR NEGATIVE NEGATIVE Final   Influenza B by PCR NEGATIVE NEGATIVE Final           _______________________________________________ Hospitalist was called for admission for Syncope and Luekocytosis  The following Work up has been ordered so far:  Orders Placed This Encounter  Procedures  . Blood culture (routine x 2)  . Resp Panel by RT-PCR (Flu A&B, Covid) Nasopharyngeal Swab  . Urine culture  . DG Chest 2 View  . CT Head Wo Contrast  . CT Cervical Spine Wo Contrast  . CBC with Differential  . Comprehensive metabolic panel  . Urinalysis, Complete w Microscopic  . Lactic acid, plasma  . TSH  . T4, free  . Protime-INR  . APTT  . DO NOT delay antibiotics if unable to obtain blood culture.  . Weigh patient  . Code Sepsis activation.  This occurs automatically when order is signed and prioritizes pharmacy, lab, and radiology services for STAT collections and interventions.  If [facility] downtime, call [facility] [phone]) to activate Code Sepsis.  Marland Kitchen pharmacy consult  . ceFEPime (MAXIPIME) per pharmacy consult  . vancomycin per pharmacy consult  . Consult to  hospitalist  . Airborne and Contact precautions  . ED EKG  . Type and screen [place]  . Insert 2nd peripheral IV if not already present.   Following  Medications were ordered in ER: Medications  ceFEPIme (MAXIPIME) 2 g in sodium chloride 0.9 % 100 mL IVPB (has no administration in time range)  metroNIDAZOLE (FLAGYL) IVPB 500 mg (has no administration in time range)  vancomycin (VANCOCIN) IVPB 1000 mg/200 mL premix (has no administration in time range)  sodium chloride 0.9 % bolus 500 mL (has no administration in time range)  sodium chloride 0.9 % bolus 500 mL (500 mLs Intravenous Bolus 10/02/20 1819)        Consult Orders  (From admission, onward)         Start     Ordered   10/02/20 1939  PT eval and treat  Routine       Question:  Reason for PT?  Answer:  debility   10/02/20 1939   10/02/20 1939  OT eval and treat  Routine       Question:  Reason for OT?  Answer:  debility   10/02/20 1939   10/02/20 1939  SLP eval and treat Reason for evaluation: .Swallowing evaluation (BSE, MBS and/or diet order as indicated)  Once       Question:  Reason for evaluation  Answer:  .Swallowing evaluation (BSE, MBS and/or diet order as indicated)   10/02/20 1939   10/02/20 1822  Consult to hospitalist  Once       Provider:  (Not yet assigned)  Question Answer Comment  Place call to: [phone]   Reason for Consult Admit      10/02/20 1821            OTHER Significant initial  Findings:  labs showing:    Recent Labs  Lab 10/02/20 1711  NA 134*  K 4.3  CO2 27  GLUCOSE 119*  BUN 23  CREATININE 0.93  CALCIUM 8.3*    Cr   stable,    Lab Results  Component Value Date   CREATININE 0.93 10/02/2020   CREATININE 0.73 02/21/2020   CREATININE 0.68 02/08/2020    Recent Labs  Lab 10/02/20 1711  AST 8*  ALT <5  ALKPHOS 51  BILITOT 2.0*  PROT 5.8*  ALBUMIN 3.5   Lab Results  Component Value Date   CALCIUM 8.3 (L) 10/02/2020      Plt: Lab Results   Component Value Date   PLT 285 10/02/2020       COVID-19 Labs  Recent Labs    10/02/20 1916  DDIMER 0.50  FERRITIN 280        Recent Labs  Lab 10/02/20 1711  WBC 22.5*  NEUTROABS 20.7*  HGB 7.7*  HCT 23.2*  MCV 117.8*  PLT 285    HG/HCT  Down   from baseline see below    Component Value Date/Time   HGB 7.7 (L) 10/02/2020 1711   HGB 12.5 (L) 08/11/2016 1701   HCT 23.2 (L) 10/02/2020 1711   HCT 34.9 (L) 08/11/2016 1701   MCV 117.8 (H) 10/02/2020 1711   MCV 107 (H) 08/11/2016 1701   MCV 105 (H) 08/13/2011 1127     Cardiac Panel (last 3 results) Recent Labs    10/02/20 1711  CKTOTAL 24*     BNP (last 3 results) Recent Labs    01/18/20 2225  BNP 51.7         Cultures:    Component Value Date/Time   SDES FOOT R FOOT 11/23/2016 1115   SPECREQUEST NONE 11/23/2016 1115   CULT  11/23/2016 1115    FEW STAPHYLOCOCCUS AUREUS NO ANAEROBES ISOLATED Performed  at [facility], [place] [address] [place], [place] [address]    REPTSTATUS 11/26/2016 FINAL 11/23/2016 1115     Radiological Exams on Admission: DG Chest 2 View  Result Date: 10/02/2020 CLINICAL DATA:  Syncope. EXAM: CHEST - 2 VIEW COMPARISON:  January 18, 2020. FINDINGS: The heart size and mediastinal contours are within normal limits. Both lungs are clear. The visualized skeletal structures are unremarkable. IMPRESSION: No active cardiopulmonary disease. Electronically Signed   By: Marijo Conception M.D.   On: 10/02/2020 18:10   CT Head Wo Contrast  Result Date: 10/02/2020 CLINICAL DATA:  Dizziness, nonspecific. Neck trauma. Additional history provided: Unresponsive at home, weakness for several days, history of Graves and Parkinson's. EXAM: CT HEAD WITHOUT CONTRAST CT CERVICAL SPINE WITHOUT CONTRAST TECHNIQUE: Multidetector CT imaging of the head and cervical spine was performed following the standard protocol without intravenous contrast. Multiplanar CT image reconstructions of the cervical spine were  also generated. COMPARISON:  Head CT 02/07/2020.  Brain MRI 03/06/2015. FINDINGS: CT HEAD FINDINGS Brain: Unchanged position of a left frontal approach deep brain stimulator which terminates in the left subthalamic region. There is no acute intracranial hemorrhage. No demarcated cortical infarct. No extra-axial fluid collection. No evidence of intracranial mass. No midline shift. Vascular: No hyperdense vessel.  Atherosclerotic calcifications. Skull: Left frontoparietal burr hole at site of DBS lead entry. No calvarial fracture or focal suspicious osseous lesion. Sinuses/Orbits: Visualized orbits show no acute finding. No significant paranasal sinus disease at the imaged levels. CT CERVICAL SPINE FINDINGS Alignment: Straightening of the expected cervical lordosis. 2 mm C4-C5 grade 1 anterolisthesis. Skull base and vertebrae: The basion-dental and atlanto-dental intervals are maintained.No evidence of acute fracture to the cervical spine. Soft tissues and spinal canal: No prevertebral fluid or swelling. No visible canal hematoma. Disc levels: Cervical spondylosis. Most notably at C5-C6, there is moderate to moderately advanced disc space narrowing with a disc bulge, endplate spurring and uncovertebral hypertrophy. Bilateral bony neural foraminal narrowing and apparent mild spinal canal stenosis at this level. Upper chest: No consolidation within the imaged lung apices. No visible pneumothorax. Other: A deep brain stimulator lead traverses the left neck. IMPRESSION: CT head: 1. No evidence of acute intracranial abnormality. 2. Unchanged position of a left frontal approach deep brain stimulator lead. CT cervical spine: 1. No evidence of acute fracture to the cervical spine. 2. 2 mm C4-C5 grade 1 anterolisthesis. 3. Cervical spondylosis, greatest at C5-C6. Electronically Signed   By: Kellie Simmering DO   On: 10/02/2020 17:56   CT Cervical Spine Wo Contrast  Result Date: 10/02/2020 CLINICAL DATA:  Dizziness,  nonspecific. Neck trauma. Additional history provided: Unresponsive at home, weakness for several days, history of Graves and Parkinson's. EXAM: CT HEAD WITHOUT CONTRAST CT CERVICAL SPINE WITHOUT CONTRAST TECHNIQUE: Multidetector CT imaging of the head and cervical spine was performed following the standard protocol without intravenous contrast. Multiplanar CT image reconstructions of the cervical spine were also generated. COMPARISON:  Head CT 02/07/2020.  Brain MRI 03/06/2015. FINDINGS: CT HEAD FINDINGS Brain: Unchanged position of a left frontal approach deep brain stimulator which terminates in the left subthalamic region. There is no acute intracranial hemorrhage. No demarcated cortical infarct. No extra-axial fluid collection. No evidence of intracranial mass. No midline shift. Vascular: No hyperdense vessel.  Atherosclerotic calcifications. Skull: Left frontoparietal burr hole at site of DBS lead entry. No calvarial fracture or focal suspicious osseous lesion. Sinuses/Orbits: Visualized orbits show no acute finding. No significant paranasal sinus disease at the  imaged levels. CT CERVICAL SPINE FINDINGS Alignment: Straightening of the expected cervical lordosis. 2 mm C4-C5 grade 1 anterolisthesis. Skull base and vertebrae: The basion-dental and atlanto-dental intervals are maintained.No evidence of acute fracture to the cervical spine. Soft tissues and spinal canal: No prevertebral fluid or swelling. No visible canal hematoma. Disc levels: Cervical spondylosis. Most notably at C5-C6, there is moderate to moderately advanced disc space narrowing with a disc bulge, endplate spurring and uncovertebral hypertrophy. Bilateral bony neural foraminal narrowing and apparent mild spinal canal stenosis at this level. Upper chest: No consolidation within the imaged lung apices. No visible pneumothorax. Other: A deep brain stimulator lead traverses the left neck. IMPRESSION: CT head: 1. No evidence of acute intracranial  abnormality. 2. Unchanged position of a left frontal approach deep brain stimulator lead. CT cervical spine: 1. No evidence of acute fracture to the cervical spine. 2. 2 mm C4-C5 grade 1 anterolisthesis. 3. Cervical spondylosis, greatest at C5-C6. Electronically Signed   By: Kellie Simmering DO   On: 10/02/2020 17:56   _______________________________________________________________________________________________________ Latest  Blood pressure 122/62, pulse (!) 135, (not confirmed) temperature 98 F (36.7 C), temperature source Oral, resp. rate 19, SpO2 100 %.   Review of Systems:    Pertinent positives include:  fatigue,  Constitutional:  No weight loss, night sweats, Fevers, chills,  weight loss  HEENT:  No headaches, Difficulty swallowing,Tooth/dental problems,Sore throat,  No sneezing, itching, ear ache, nasal congestion, post nasal drip,  Cardio-vascular:  No chest pain, Orthopnea, PND, anasarca, dizziness, palpitations.no Bilateral lower extremity swelling  GI:  No heartburn, indigestion, abdominal pain, nausea, vomiting, diarrhea, change in bowel habits, loss of appetite, melena, blood in stool, hematemesis Resp:  no shortness of breath at rest. No dyspnea on exertion, No excess mucus, no productive cough, No non-productive cough, No coughing up of blood.No change in color of mucus.No wheezing. Skin:  no rash or lesions. No jaundice GU:  no dysuria, change in color of urine, no urgency or frequency. No straining to urinate.  No flank pain.  Musculoskeletal:  No joint pain or no joint swelling. No decreased range of motion. No back pain.  Psych:  No change in mood or affect. No depression or anxiety. No memory loss.  Neuro: no localizing neurological complaints, no tingling, no weakness, no double vision, no gait abnormality, no slurred speech, no confusion  All systems reviewed and apart from [place] all are  negative _______________________________________________________________________________________________ Past Medical History:   Past Medical History:  Diagnosis Date  . Arthritis    left hip   . GERD (gastroesophageal reflux disease) Pre 2002   Mild  . History of kidney stones   . 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 Parkinson disease ([place]) 2004   Slowly progressive  . Ruptured appendix teens      Past Surgical History:  Procedure Laterality Date  . APPENDECTOMY    . CATARACT EXTRACTION W/ INTRAOCULAR LENS  IMPLANT, BILATERAL  2017  . DEEP BRAIN STIMULATOR PLACEMENT  8/14   L STN  . DOPPLER ECHOCARDIOGRAPHY  01/10/2009   LV NML Mild LVH EF 60-65% aortic sclerosis w/0 stenosis   . JOINT REPLACEMENT     left hip replacement 08/14/11/[place]   . SUBTHALAMIC STIMULATOR BATTERY REPLACEMENT Left 03/25/2017   Procedure: Deep Brain Stimulator battery replacement;  Surgeon: Erline Levine, MD;  Location: [facility];  Service: Neurosurgery;  Laterality: Left;  left  . TONSILLECTOMY    .  TOTAL HIP REVISION  08/27/2011   Procedure: TOTAL HIP REVISION;  Surgeon: Mauri Pole, MD;  Location: [facility];  Service: Orthopedics;  Laterality: Left;    Social History:  Ambulatory   Cane or walker      reports that he has never smoked. He has never used smokeless tobacco. He reports current alcohol use of about 7.0 standard drinks of alcohol per week. He reports that he does not use drugs.    Family History:   Family History  Problem Relation Age of Onset  . Arthritis Mother        knee replacement  . COPD Father        emphysema, smoker  . Heart disease Father        CHF  . Healthy Sister   . Alcohol abuse Paternal Uncle   . Rheum arthritis Daughter    ______________________________________________________________________________________________ Allergies: Allergies  Allergen Reactions  . Amoxicillin   .  Celebrex [Celecoxib]   . Demerol Other (See Comments)    Hallucinations   . Penicillins     REACTION: RASH     Prior to Admission medications   Medication Sig Start Date End Date Taking? Authorizing Provider  carbidopa-levodopa (SINEMET IR) 25-100 MG tablet TAKE 2 AT 7:00, 1 AT 10AM,1 AT 1PM, 1 AT 4PM AND 1 AT 7PM 08/02/20   Tat, Rebecca S, DO  gabapentin (NEURONTIN) 100 MG capsule TAKE ONE CAPSULE THREE TIMES A DAY AND TWO CAPSULES AT BEDTIME. 08/03/20   [name], MD  magnesium hydroxide (MILK OF MAGNESIA) 400 MG/5ML suspension Take 5 mLs by mouth daily as needed for mild constipation.    [provider]  polyethylene glycol (MIRALAX / GLYCOLAX) 17 g packet Take 17 g by mouth daily as needed.     [provider]  vitamin B-12 (CYANOCOBALAMIN) 1000 MCG tablet Take 1 tablet (1,000 mcg total) by mouth daily. 02/21/20   Earlie Server, MD    ___________________________________________________________________________________________________ Physical Exam: Vitals with BMI 10/02/2020 10/02/2020 09/19/2020  Height - - -  Weight - - -  BMI - - -  Systolic 518 99 841  Diastolic 62 49 50  Pulse 660 63 61     1. General:  in No  Acute distress   Chronically ill -appearing 2. Psychological: Alert and  Oriented 3. Head/ENT:  Dry Mucous Membranes                          Head Non traumatic, neck supple                           Poor Dentition 4. SKIN:   decreased Skin turgor,  Skin clean Dry and intact no rash 5. Heart: Regular rate and rhythm no Murmur, no Rub or gallop 6. Lungs: no wheezes or crackles   7. Abdomen: Soft,  non-tender,   distended   bowel sounds present 8. Lower extremities: no clubbing, cyanosis, no edema 9. Neurologically Grossly intact, moving all 4 extremities equally  10. MSK: Normal range of motion    Chart has been reviewed  ______________________________________________________________________________________________  Assessment/Plan   80 y.o.  male with medical history significant of Parkinson sp  left [facility]N DBS at [facility] in August, 2014., Myelodysplastic syndrome, GERD   Admitted for Syncope, leukocytosis and anemia  Present on Admission: . Syncope and collapse -  given risk factor will admit rehydrate obtain CE, monitor on tele  and obtain carotid dopplers, check orthostatics and rehydrate Given history of malignancy will check a D-dimer and if significantly elevated may need work-up for PE  . Vascular dementia without behavioral disturbance ([place]) -monitor for any signs of sundowning  . Parkinson's disease ([place]) -continue home medications  . MDS (myelodysplastic syndrome) ([facility])-patient was lost to follow-up will need to continue with following up with oncology  . Anemia -Hemoccult negative obtain anemia panel Evaluate for any potential source of bleeding May need  Blood transfusion if Hg drops  Due rehydration  . Leukocytosis -evaluate for any evidence of infection chest x-ray unremarkable, UA no UTI Abnormal KUB possible gallstones vs renal stones  will obtain CT abd No fever at this time initially antibiotics given in ER will hold off for now unless CT abd is abnormal.   Constipation order bowel regimen  _________________________________________  11:41 PM  CT scan showed non-obstructing stone in right ureter 69mm and mild hydro but also possible PNA Started on Rocephin (pen allergic but  recently had cephalosporins) and azithro. SLP assess for aspiration  No evidence of UTI and no evidence of obstruction no indication for emergent urological consult will let Urology know through epic.   ___________________________________________ Other plan as per orders.  DVT prophylaxis:  SCD      Code Status:    Code Status: Prior FULL CODE   as per patient  I had personally discussed CODE STATUS with patient and family    Family Communication:   Family not at  Bedside  plan of care was discussed on the phone with  Daughter    Disposition Plan:       To home once workup is complete and patient is stable   Following barriers for discharge:                            Electrolytes corrected                               Anemia corrected or stable                                                       white count improving                               Will need to be able to tolerate PO                                         Would benefit from PT/OT eval prior to DC  Ordered                   Swallow eval - SLP ordered                     Consults called: none  Admission status:  ED Disposition    ED Disposition Condition [place]: [place] [100120]  Level of Care: Med-Surg [16]  Covid Evaluation: Asymptomatic Screening Protocol (No Symptoms)  Diagnosis: Syncope and collapse [780.2.ICD-9-CM]  Admitting  Physician: Toy Baker [[id number]]  Attending Physician: Toy Baker [[id number]]  Bed request comments: telemetry       Obs    Level of care     tele  For   24H    Lab Results  Component Value Date   [place] NEGATIVE 10/02/2020     Precautions: admitted as  Covid Negative      PPE: Used by the provider:   N95  eye Goggles,  Gloves     Temitope Flammer 10/02/2020, 9:29 PM    [facility]     after 2 AM please page floor coverage PA If 7AM-7PM, please contact the day team taking care of the patient using [email or web address]   Patient was evaluated in the context of the global COVID-19 pandemic, which necessitated consideration that the patient might be at risk for infection with the SARS-CoV-2 virus that causes COVID-19. Institutional protocols and algorithms that pertain to the evaluation of patients at risk for COVID-19 are in a state of rapid change based on information released by regulatory bodies including the CDC and federal and state organizations. These policies and algorithms were followed during the patient's care.

## 2020-10-02 NOTE — ED Notes (Addendum)
Pt presents to ED via EMS with c/o of being unresponsive at home but EMS states pt started becoming responsive on his way here. Pt states weakness for a few days and states a hx of Graves and Parkinson's. Pt is currently A&Ox4. Pt denies chest pain or SOB. Pt denies fevers or chills. Pt smells like urine on arrival and states he normally only urinates himself at night. Pt denies burning on urination.   Pt has cold feet but bilateral normal pedal pulses. Pt also has bruise to L foot and R lateral side of knee due to a fall. Pt denies taking blood thinners to this RN. Pt has bilateral equal grips and no prponator drift noted.

## 2020-10-02 NOTE — ED Notes (Signed)
Pt transferred to hospital bed from ED stretcher by this RN and Jena Gauss. Clean chux placed under pt. External catheter connected to suction. Pt covered with warm blankets. Call bell and cell phone placed within pt reach. Pt denies any further needs at this time. Lights dimmed for rest.

## 2020-10-02 NOTE — ED Notes (Signed)
Patient urinated into external catheter with leakage. Patient bed linens changed and peri care performed. Patient positioned back in bed for comfort and warm blanket re-applied. Gown applied to patient and RN ensured cardiac monitor remained in place. IVF and abx continue infusing. Patient breathing unlabored speaking in full sentences. Patient denies pain or other needs at this time. Family at bedside assisting patient with eating meal. Call bell within reach and RN to continue to monitor.

## 2020-10-02 NOTE — ED Notes (Signed)
R lateral knee wound cleansed with saline and wrapped with non adherent tefla and wrapped in kurlex.

## 2020-10-02 NOTE — Consult Note (Signed)
CODE SEPSIS - PHARMACY COMMUNICATION  **Broad Spectrum Antibiotics should be administered within 1 hour of Sepsis diagnosis**  Time Code Sepsis Called/Page Received: 1821  Antibiotics Ordered: cefepime, vancomycin, metronidazole   Time of 1st antibiotic administration: 1836  Additional action taken by pharmacy: n/a  If necessary, Name of Provider/Nurse Contacted: [place] ,PharmD, BCPS Clinical Pharmacist  10/02/2020  6:22 PM

## 2020-10-02 NOTE — Sepsis Progress Note (Signed)
Elink tracking the Code Sepsis.  Notified bedside nurse of need to administer antibiotics.

## 2020-10-02 NOTE — ED Notes (Signed)
Pharmacy contacted requesting they send ordered medication for RN to administer.

## 2020-10-02 NOTE — Consult Note (Signed)
PHARMACY -  BRIEF ANTIBIOTIC NOTE   Pharmacy has received consult(s) for cefepime and vancomycin from an ED provider.  The patient's profile has been reviewed for ht/wt/allergies/indication/available labs.    One time order(s) placed for   Cefepime 2 gram  Vancomycin 1750 mg   Further antibiotics/pharmacy consults should be ordered by admitting physician if indicated.                       Thank you, Dorothe Pea, PharmD, BCPS Clinical Pharmacist  10/02/2020  6:23 PM

## 2020-10-02 NOTE — ED Notes (Signed)
Ultrasound at bedside completing exam.

## 2020-10-02 NOTE — ED Provider Notes (Signed)
[facility] Emergency Department Provider Note  ____________________________________________   Event Date/Time   First MD Initiated Contact with Patient 10/02/20 1709     (approximate)  I have reviewed the triage vital signs and the nursing notes.   HISTORY  Chief Complaint Dizziness    HPI Casey Tucker is a 80 y.o. male with Parkinson's who comes in for altered mental status.  According to EMS patient had episode where he was unresponsive with his caregiver.  Initially patient was unresponsive with EMS but then started becoming more alert and oriented.  Casey Tucker came who is a Marine scientist in our ER who states that he is a family friend and states that the wife also has some dementia so is hard to know exactly what happened but the story that she got was that he was laying on the ground and was on responsive for her  Patient himself now denies any chest pain, shortness of breath, abdominal pain.  He does report falls which is why he has some scattered bruising but denies any pain anywhere.  Denies any dark stools          Past Medical History:  Diagnosis Date  . Arthritis    left hip   . GERD (gastroesophageal reflux disease) Pre 2002   Mild  . History of kidney stones   . 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 Parkinson disease ([place]) 2004   Slowly progressive  . Ruptured appendix teens    Patient Active Problem List   Diagnosis Date Noted  . B12 deficiency 04/03/2020  . MDS (myelodysplastic syndrome) ([place]) 04/03/2020  . Goals of care, counseling/discussion 04/03/2020  . Syncope 02/07/2020  . Anemia 02/07/2020  . Leukopenia 02/07/2020  . Neurogenic urinary incontinence 11/02/2019  . Vascular dementia without behavioral disturbance ([place]) 07/04/2019  . Mood disorder ([place]) 07/04/2019  . Spinal stenosis, lumbar region with neurogenic claudication 04/07/2019   . Irregular heart beat 04/07/2019  . Malnutrition of mild degree ([place]) 04/07/2019  . Urinary frequency 01/21/2018  . Advanced directives, counseling/discussion 04/20/2014  . Routine general medical examination at a health care facility 03/16/2012  . 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 Osteoarthrosis, hip 08/12/2011  . Parkinson's disease ([place]) 01/29/2011  . GILBERT'S SYNDROME 12/09/2007  . GERD 12/09/2007  . RENAL CALCULUS, HX OF 12/09/2007    Past Surgical History:  Procedure Laterality Date  . APPENDECTOMY    . CATARACT EXTRACTION W/ INTRAOCULAR LENS  IMPLANT, BILATERAL  2017  . DEEP BRAIN STIMULATOR PLACEMENT  8/14   L STN  . DOPPLER ECHOCARDIOGRAPHY  01/10/2009   LV NML Mild LVH EF 60-65% aortic sclerosis w/0 stenosis   . JOINT REPLACEMENT     left hip replacement 08/14/11/[place]   . SUBTHALAMIC STIMULATOR BATTERY REPLACEMENT Left 03/25/2017   Procedure: Deep Brain Stimulator battery replacement;  Surgeon: Erline Levine, MD;  Location: [place];  Service: Neurosurgery;  Laterality: Left;  left  . TONSILLECTOMY    . TOTAL HIP REVISION  08/27/2011   Procedure: TOTAL HIP REVISION;  Surgeon: Mauri Pole, MD;  Location: [facility];  Service: Orthopedics;  Laterality: Left;    Prior to Admission medications   Medication Sig Start Date End Date Taking? Authorizing Provider  carbidopa-levodopa (SINEMET IR) 25-100 MG tablet TAKE 2 AT 7:00, 1 AT 10AM,1 AT 1PM, 1 AT 4PM AND 1 AT 7PM 08/02/20   Tat, Eustace Quail,  DO  gabapentin (NEURONTIN) 100 MG capsule TAKE ONE CAPSULE THREE TIMES A DAY AND TWO CAPSULES AT BEDTIME. 08/03/20   [name], MD  magnesium hydroxide (MILK OF MAGNESIA) 400 MG/5ML suspension Take 5 mLs by mouth daily as needed for mild constipation.    [provider]  polyethylene glycol (MIRALAX / GLYCOLAX) 17 g packet Take 17 g by mouth daily as needed.     [provider]  vitamin B-12 (CYANOCOBALAMIN) 1000 MCG tablet Take 1 tablet (1,000 mcg total)  by mouth daily. 02/21/20   Earlie Server, MD    Allergies Amoxicillin, Celebrex [celecoxib], Demerol, and Penicillins  Family History  Problem Relation Age of Onset  . Arthritis Mother        knee replacement  . COPD Father        emphysema, smoker  . Heart disease Father        CHF  . Healthy Sister   . Alcohol abuse Paternal Uncle   . Rheum arthritis Daughter     Social History Social History   Tobacco Use  . Smoking status: Never Smoker  . Smokeless tobacco: Never Used  Vaping Use  . Vaping Use: Never used  Substance Use Topics  . Alcohol use: Yes    Alcohol/week: 7.0 standard drinks    Types: 7 Standard drinks or equivalent per week    Comment: occassionally  . Drug use: No      Review of Systems Constitutional: No fever/chills, episode of unresponsiveness, falls Eyes: No visual changes. ENT: No sore throat. Cardiovascular: Denies chest pain. Respiratory: Denies shortness of breath. Gastrointestinal: No abdominal pain.  No nausea, no vomiting.  No diarrhea.  No constipation. Genitourinary: Negative for dysuria. Musculoskeletal: Negative for back pain.  Scattered bruising which he does report having some falls Skin: Negative for rash. Neurological: Negative for headaches, focal weakness or numbness. All other ROS negative ____________________________________________   PHYSICAL EXAM:  VITAL SIGNS: ED Triage Vitals [10/02/20 1707]  Enc Vitals Group     BP (!) 99/49     Pulse Rate 63     Resp 19     Temp 98 F (36.7 C)     Temp Source Oral     SpO2 99 %     Weight      Height      Head Circumference      Peak Flow      Pain Score      Pain Loc      Pain Edu?      Excl. in [place]?     Constitutional: Alert and oriented. Well appearing and in no acute distress.  Elderly male Eyes: Conjunctivae are normal. EOMI. Head: Atraumatic. Nose: No congestion/rhinnorhea. Mouth/Throat: Mucous membranes are moist.   Neck: No stridor. Trachea Midline.  FROM Cardiovascular: Normal rate, regular rhythm. Grossly normal heart sounds.  Good peripheral circulation.  No chest wall tenderness Respiratory: Normal respiratory effort.  No retractions. Lungs CTAB. Gastrointestinal: Soft and nontender. No distention. No abdominal bruits.  Musculoskeletal: No lower extremity tenderness nor edema.  No joint effusions.  Scattered bruising's on his extremities but equal strength in his arms and legs and no tenderness along the bones. Neurologic: Mild speech impediment at baseline from Parkinson's cranial nerves appear intact. Skin:  Skin is warm, dry and intact. No rash noted. Psychiatric: Mood and affect are normal.  Mild speech impediment baseline from Parkinson's GU: Deferred   ____________________________________________   LABS (all labs ordered are listed, but  only abnormal results are displayed)  Labs Reviewed  CBC WITH DIFFERENTIAL/PLATELET - Abnormal; Notable for the following components:      Result Value   WBC 22.5 (*)    RBC 1.97 (*)    Hemoglobin 7.7 (*)    HCT 23.2 (*)    MCV 117.8 (*)    MCH 39.1 (*)    Neutro Abs 20.7 (*)    Lymphs Abs 0.6 (*)    Abs Immature Granulocytes 0.17 (*)    All other components within normal limits  CULTURE, BLOOD (ROUTINE X 2)  CULTURE, BLOOD (ROUTINE X 2)  RESP PANEL BY RT-PCR (FLU A&B, COVID) ARPGX2  COMPREHENSIVE METABOLIC PANEL  URINALYSIS, COMPLETE (UACMP) WITH MICROSCOPIC  LACTIC ACID, PLASMA  LACTIC ACID, PLASMA  TSH  T4, FREE  TROPONIN I (HIGH SENSITIVITY)   ____________________________________________   ED ECG REPORT I, Vanessa [place], the attending physician, personally viewed and interpreted this ECG.  Normal sinus rate of 73, no ST elevation, T wave inversion in aVL, normal intervals.  Some artifact secondary to deep brain stimulator ____________________________________________  RADIOLOGY Robert Bellow, personally viewed and evaluated these images (plain radiographs) as part of  my medical decision making, as well as reviewing the written report by the radiologist.  ED MD interpretation: No pneumonia  Official radiology report(s): DG Chest 2 View  Result Date: 10/02/2020 CLINICAL DATA:  Syncope. EXAM: CHEST - 2 VIEW COMPARISON:  January 18, 2020. FINDINGS: The heart size and mediastinal contours are within normal limits. Both lungs are clear. The visualized skeletal structures are unremarkable. IMPRESSION: No active cardiopulmonary disease. Electronically Signed   By: Marijo Conception M.D.   On: 10/02/2020 18:10   CT Head Wo Contrast  Result Date: 10/02/2020 CLINICAL DATA:  Dizziness, nonspecific. Neck trauma. Additional history provided: Unresponsive at home, weakness for several days, history of Graves and Parkinson's. EXAM: CT HEAD WITHOUT CONTRAST CT CERVICAL SPINE WITHOUT CONTRAST TECHNIQUE: Multidetector CT imaging of the head and cervical spine was performed following the standard protocol without intravenous contrast. Multiplanar CT image reconstructions of the cervical spine were also generated. COMPARISON:  Head CT 02/07/2020.  Brain MRI 03/06/2015. FINDINGS: CT HEAD FINDINGS Brain: Unchanged position of a left frontal approach deep brain stimulator which terminates in the left subthalamic region. There is no acute intracranial hemorrhage. No demarcated cortical infarct. No extra-axial fluid collection. No evidence of intracranial mass. No midline shift. Vascular: No hyperdense vessel.  Atherosclerotic calcifications. Skull: Left frontoparietal burr hole at site of DBS lead entry. No calvarial fracture or focal suspicious osseous lesion. Sinuses/Orbits: Visualized orbits show no acute finding. No significant paranasal sinus disease at the imaged levels. CT CERVICAL SPINE FINDINGS Alignment: Straightening of the expected cervical lordosis. 2 mm C4-C5 grade 1 anterolisthesis. Skull base and vertebrae: The basion-dental and atlanto-dental intervals are maintained.No evidence of  acute fracture to the cervical spine. Soft tissues and spinal canal: No prevertebral fluid or swelling. No visible canal hematoma. Disc levels: Cervical spondylosis. Most notably at C5-C6, there is moderate to moderately advanced disc space narrowing with a disc bulge, endplate spurring and uncovertebral hypertrophy. Bilateral bony neural foraminal narrowing and apparent mild spinal canal stenosis at this level. Upper chest: No consolidation within the imaged lung apices. No visible pneumothorax. Other: A deep brain stimulator lead traverses the left neck. IMPRESSION: CT head: 1. No evidence of acute intracranial abnormality. 2. Unchanged position of a left frontal approach deep brain stimulator lead. CT cervical spine: 1. No evidence  of acute fracture to the cervical spine. 2. 2 mm C4-C5 grade 1 anterolisthesis. 3. Cervical spondylosis, greatest at C5-C6. Electronically Signed   By: Kellie Simmering DO   On: 10/02/2020 17:56   CT Cervical Spine Wo Contrast  Result Date: 10/02/2020 CLINICAL DATA:  Dizziness, nonspecific. Neck trauma. Additional history provided: Unresponsive at home, weakness for several days, history of Graves and Parkinson's. EXAM: CT HEAD WITHOUT CONTRAST CT CERVICAL SPINE WITHOUT CONTRAST TECHNIQUE: Multidetector CT imaging of the head and cervical spine was performed following the standard protocol without intravenous contrast. Multiplanar CT image reconstructions of the cervical spine were also generated. COMPARISON:  Head CT 02/07/2020.  Brain MRI 03/06/2015. FINDINGS: CT HEAD FINDINGS Brain: Unchanged position of a left frontal approach deep brain stimulator which terminates in the left subthalamic region. There is no acute intracranial hemorrhage. No demarcated cortical infarct. No extra-axial fluid collection. No evidence of intracranial mass. No midline shift. Vascular: No hyperdense vessel.  Atherosclerotic calcifications. Skull: Left frontoparietal burr hole at site of DBS lead entry.  No calvarial fracture or focal suspicious osseous lesion. Sinuses/Orbits: Visualized orbits show no acute finding. No significant paranasal sinus disease at the imaged levels. CT CERVICAL SPINE FINDINGS Alignment: Straightening of the expected cervical lordosis. 2 mm C4-C5 grade 1 anterolisthesis. Skull base and vertebrae: The basion-dental and atlanto-dental intervals are maintained.No evidence of acute fracture to the cervical spine. Soft tissues and spinal canal: No prevertebral fluid or swelling. No visible canal hematoma. Disc levels: Cervical spondylosis. Most notably at C5-C6, there is moderate to moderately advanced disc space narrowing with a disc bulge, endplate spurring and uncovertebral hypertrophy. Bilateral bony neural foraminal narrowing and apparent mild spinal canal stenosis at this level. Upper chest: No consolidation within the imaged lung apices. No visible pneumothorax. Other: A deep brain stimulator lead traverses the left neck. IMPRESSION: CT head: 1. No evidence of acute intracranial abnormality. 2. Unchanged position of a left frontal approach deep brain stimulator lead. CT cervical spine: 1. No evidence of acute fracture to the cervical spine. 2. 2 mm C4-C5 grade 1 anterolisthesis. 3. Cervical spondylosis, greatest at C5-C6. Electronically Signed   By: Kellie Simmering DO   On: 10/02/2020 17:56    ____________________________________________   PROCEDURES  Procedure(s) performed (including Critical Care):  .1-3 Lead EKG Interpretation Performed by: Vanessa Arpin, MD Authorized by: Vanessa [place], MD     Interpretation: normal     ECG rate:  60s-70s   ECG rate assessment: normal     Rhythm: sinus rhythm     Ectopy: none     Conduction: normal   .Critical Care Performed by: Vanessa [place], MD Authorized by: Vanessa [place], MD   Critical care provider statement:    Critical care time (minutes):  35   Critical care was necessary to treat or prevent imminent or life-threatening  deterioration of the following conditions: Significant leukocytosis requiring fluids and antibiotics.   Critical care was time spent personally by me on the following activities:  Discussions with consultants, evaluation of patient's response to treatment, examination of patient, ordering and performing treatments and interventions, ordering and review of laboratory studies, ordering and review of radiographic studies, pulse oximetry, re-evaluation of patient's condition, obtaining history from patient or surrogate and review of old charts     ____________________________________________   [place] / [place] / ED COURSE  Casey Tucker was evaluated in Emergency Department on 10/02/2020 for the symptoms described in the history of present  illness. He was evaluated in the context of the global COVID-19 pandemic, which necessitated consideration that the patient might be at risk for infection with the SARS-CoV-2 virus that causes COVID-19. Institutional protocols and algorithms that pertain to the evaluation of patients at risk for COVID-19 are in a state of rapid change based on information released by regulatory bodies including the CDC and federal and state organizations. These policies and algorithms were followed during the patient's care in the ED.    Patient is a 80 year old with unresponsiveness witnessed by his wife.  Work-up was initiated to evaluate for intracranial hemorrhage, cervical fracture, Electra abnormalities, AKI, UA, pneumonia, COVID.  Patient this time however is alert and oriented x3 and denies any complaints.  No focal deficits to suggest stroke.  We will keep patient in the cardiac monitor to evaluate for arrhythmia.  EKG without evidence of arrhythmia some artifact secondary to deep brain stimulator  CT scans are negative, x-ray negative, labs are reassuring other than a significant elevated white count up from baseline of 4.  Will initiate broad-spectrum  antibiotics and get blood cultures.  Lactate was normal.  Patient got 1 L of fluid.  Patient hemoglobin also downtrending has a history of myelodysplastic syndrome.  He has had brown stool.  Will discuss possible team for admission for syncopal work-up as well as elevated leukocytosis       ____________________________________________   FINAL CLINICAL IMPRESSION(S) / ED DIAGNOSES   Final diagnoses:  Leukocytosis, unspecified type  Syncope and collapse      MEDICATIONS GIVEN DURING THIS VISIT:  Medications  ceFEPIme (MAXIPIME) 2 g in sodium chloride 0.9 % 100 mL IVPB (2 g Intravenous New Bag/Given 10/02/20 1836)  metroNIDAZOLE (FLAGYL) IVPB 500 mg (has no administration in time range)  vancomycin (VANCOREADY) IVPB 1750 mg/350 mL (has no administration in time range)  sodium chloride 0.9 % bolus 500 mL (500 mLs Intravenous Bolus 10/02/20 1819)  sodium chloride 0.9 % bolus 500 mL (500 mLs Intravenous Bolus 10/02/20 1835)     ED Discharge Orders    None       Note:  This document was prepared using Dragon voice recognition software and may include unintentional dictation errors.   Vanessa [place], MD 10/02/20 1840

## 2020-10-03 ENCOUNTER — Telehealth: Payer: Self-pay | Admitting: *Deleted

## 2020-10-03 ENCOUNTER — Inpatient Hospital Stay: Payer: Medicare Other

## 2020-10-03 ENCOUNTER — Other Ambulatory Visit: Payer: Self-pay | Admitting: Physician Assistant

## 2020-10-03 ENCOUNTER — Telehealth: Payer: Self-pay | Admitting: Internal Medicine

## 2020-10-03 ENCOUNTER — Other Ambulatory Visit: Payer: Self-pay

## 2020-10-03 DIAGNOSIS — Z96642 Presence of left artificial hip joint: Secondary | ICD-10-CM | POA: Diagnosis present

## 2020-10-03 DIAGNOSIS — R404 Transient alteration of awareness: Secondary | ICD-10-CM | POA: Diagnosis not present

## 2020-10-03 DIAGNOSIS — Z749 Problem related to care provider dependency, unspecified: Secondary | ICD-10-CM | POA: Diagnosis not present

## 2020-10-03 DIAGNOSIS — J189 Pneumonia, unspecified organism: Secondary | ICD-10-CM | POA: Diagnosis present

## 2020-10-03 DIAGNOSIS — R1310 Dysphagia, unspecified: Secondary | ICD-10-CM | POA: Diagnosis present

## 2020-10-03 DIAGNOSIS — Z888 Allergy status to other drugs, medicaments and biological substances status: Secondary | ICD-10-CM | POA: Diagnosis not present

## 2020-10-03 DIAGNOSIS — R4182 Altered mental status, unspecified: Secondary | ICD-10-CM | POA: Diagnosis present

## 2020-10-03 DIAGNOSIS — M2550 Pain in unspecified joint: Secondary | ICD-10-CM | POA: Diagnosis not present

## 2020-10-03 DIAGNOSIS — N201 Calculus of ureter: Secondary | ICD-10-CM

## 2020-10-03 DIAGNOSIS — K219 Gastro-esophageal reflux disease without esophagitis: Secondary | ICD-10-CM | POA: Diagnosis present

## 2020-10-03 DIAGNOSIS — Z741 Need for assistance with personal care: Secondary | ICD-10-CM | POA: Diagnosis not present

## 2020-10-03 DIAGNOSIS — R55 Syncope and collapse: Secondary | ICD-10-CM | POA: Diagnosis present

## 2020-10-03 DIAGNOSIS — M48062 Spinal stenosis, lumbar region with neurogenic claudication: Secondary | ICD-10-CM | POA: Diagnosis not present

## 2020-10-03 DIAGNOSIS — Z825 Family history of asthma and other chronic lower respiratory diseases: Secondary | ICD-10-CM | POA: Diagnosis not present

## 2020-10-03 DIAGNOSIS — E43 Unspecified severe protein-calorie malnutrition: Secondary | ICD-10-CM | POA: Diagnosis present

## 2020-10-03 DIAGNOSIS — R2689 Other abnormalities of gait and mobility: Secondary | ICD-10-CM | POA: Diagnosis not present

## 2020-10-03 DIAGNOSIS — G20 Parkinson's disease: Secondary | ICD-10-CM | POA: Diagnosis present

## 2020-10-03 DIAGNOSIS — I959 Hypotension, unspecified: Secondary | ICD-10-CM | POA: Diagnosis present

## 2020-10-03 DIAGNOSIS — J690 Pneumonitis due to inhalation of food and vomit: Secondary | ICD-10-CM | POA: Diagnosis present

## 2020-10-03 DIAGNOSIS — Z20822 Contact with and (suspected) exposure to covid-19: Secondary | ICD-10-CM | POA: Diagnosis present

## 2020-10-03 DIAGNOSIS — K5900 Constipation, unspecified: Secondary | ICD-10-CM | POA: Diagnosis present

## 2020-10-03 DIAGNOSIS — D638 Anemia in other chronic diseases classified elsewhere: Secondary | ICD-10-CM | POA: Diagnosis present

## 2020-10-03 DIAGNOSIS — G8929 Other chronic pain: Secondary | ICD-10-CM | POA: Diagnosis present

## 2020-10-03 DIAGNOSIS — Z7189 Other specified counseling: Secondary | ICD-10-CM | POA: Diagnosis not present

## 2020-10-03 DIAGNOSIS — Z515 Encounter for palliative care: Secondary | ICD-10-CM | POA: Diagnosis not present

## 2020-10-03 DIAGNOSIS — F0150 Vascular dementia without behavioral disturbance: Secondary | ICD-10-CM | POA: Diagnosis present

## 2020-10-03 DIAGNOSIS — D72829 Elevated white blood cell count, unspecified: Secondary | ICD-10-CM | POA: Diagnosis not present

## 2020-10-03 DIAGNOSIS — Z880 Allergy status to penicillin: Secondary | ICD-10-CM | POA: Diagnosis not present

## 2020-10-03 DIAGNOSIS — M6281 Muscle weakness (generalized): Secondary | ICD-10-CM | POA: Diagnosis not present

## 2020-10-03 DIAGNOSIS — R4189 Other symptoms and signs involving cognitive functions and awareness: Secondary | ICD-10-CM | POA: Diagnosis not present

## 2020-10-03 DIAGNOSIS — G629 Polyneuropathy, unspecified: Secondary | ICD-10-CM | POA: Diagnosis present

## 2020-10-03 DIAGNOSIS — Z8249 Family history of ischemic heart disease and other diseases of the circulatory system: Secondary | ICD-10-CM | POA: Diagnosis not present

## 2020-10-03 DIAGNOSIS — Z23 Encounter for immunization: Secondary | ICD-10-CM | POA: Diagnosis not present

## 2020-10-03 DIAGNOSIS — R32 Unspecified urinary incontinence: Secondary | ICD-10-CM | POA: Diagnosis not present

## 2020-10-03 DIAGNOSIS — Z7401 Bed confinement status: Secondary | ICD-10-CM | POA: Diagnosis not present

## 2020-10-03 DIAGNOSIS — N132 Hydronephrosis with renal and ureteral calculous obstruction: Secondary | ICD-10-CM | POA: Diagnosis present

## 2020-10-03 DIAGNOSIS — D469 Myelodysplastic syndrome, unspecified: Secondary | ICD-10-CM | POA: Diagnosis present

## 2020-10-03 DIAGNOSIS — D649 Anemia, unspecified: Secondary | ICD-10-CM | POA: Diagnosis not present

## 2020-10-03 DIAGNOSIS — R278 Other lack of coordination: Secondary | ICD-10-CM | POA: Diagnosis not present

## 2020-10-03 DIAGNOSIS — Z8261 Family history of arthritis: Secondary | ICD-10-CM | POA: Diagnosis not present

## 2020-10-03 LAB — COMPREHENSIVE METABOLIC PANEL
ALT: 6 U/L (ref 0–44)
AST: 9 U/L — ABNORMAL LOW (ref 15–41)
Albumin: 3.1 g/dL — ABNORMAL LOW (ref 3.5–5.0)
Alkaline Phosphatase: 46 U/L (ref 38–126)
Anion gap: 5 (ref 5–15)
BUN: 18 mg/dL (ref 8–23)
CO2: 25 mmol/L (ref 22–32)
Calcium: 7.8 mg/dL — ABNORMAL LOW (ref 8.9–10.3)
Chloride: 107 mmol/L (ref 98–111)
Creatinine, Ser: 0.64 mg/dL (ref 0.61–1.24)
GFR, Estimated: >60 mL/min (ref >60)
Glucose, Bld: 91 mg/dL (ref 70–99)
Potassium: 4 mmol/L (ref 3.5–5.1)
Sodium: 137 mmol/L (ref 135–145)
Total Bilirubin: 1.3 mg/dL — ABNORMAL HIGH (ref 0.3–1.2)
Total Protein: 5.4 g/dL — ABNORMAL LOW (ref 6.5–8.1)

## 2020-10-03 LAB — CBC WITH DIFFERENTIAL/PLATELET
Abs Immature Granulocytes: 0.03 10*3/uL (ref 0.00–0.07)
Basophils Absolute: 0 10*3/uL (ref 0.0–0.1)
Basophils Relative: 1 %
Eosinophils Absolute: 0.2 10*3/uL (ref 0.0–0.5)
Eosinophils Relative: 3 %
HCT: 21 % — ABNORMAL LOW (ref 39.0–52.0)
Hemoglobin: 6.7 g/dL — ABNORMAL LOW (ref 13.0–17.0)
Immature Granulocytes: 0 %
Lymphocytes Relative: 15 %
Lymphs Abs: 1.1 10*3/uL (ref 0.7–4.0)
MCH: 39.2 pg — ABNORMAL HIGH (ref 26.0–34.0)
MCHC: 31.9 g/dL (ref 30.0–36.0)
MCV: 122.8 fL — ABNORMAL HIGH (ref 80.0–100.0)
Monocytes Absolute: 0.6 10*3/uL (ref 0.1–1.0)
Monocytes Relative: 8 %
Neutro Abs: 5.1 10*3/uL (ref 1.7–7.7)
Neutrophils Relative %: 73 %
Platelets: 229 10*3/uL (ref 150–400)
RBC: 1.71 MIL/uL — ABNORMAL LOW (ref 4.22–5.81)
RDW: 14.9 % (ref 11.5–15.5)
Smear Review: NORMAL
WBC: 7 10*3/uL (ref 4.0–10.5)
nRBC: 0 % (ref 0.0–0.2)

## 2020-10-03 LAB — TROPONIN I (HIGH SENSITIVITY): Troponin I (High Sensitivity): 4 ng/L (ref <18)

## 2020-10-03 LAB — TSH: TSH: 4.029 u[IU]/mL (ref 0.350–4.500)

## 2020-10-03 LAB — STREP PNEUMONIAE URINARY ANTIGEN: Strep Pneumo Urinary Antigen: NEGATIVE

## 2020-10-03 LAB — VITAMIN B12: Vitamin B-12: 760 pg/mL (ref 180–914)

## 2020-10-03 LAB — PREPARE RBC (CROSSMATCH)

## 2020-10-03 LAB — PHOSPHORUS: Phosphorus: 3.1 mg/dL (ref 2.5–4.6)

## 2020-10-03 LAB — PATHOLOGIST SMEAR REVIEW

## 2020-10-03 LAB — MAGNESIUM: Magnesium: 2.1 mg/dL (ref 1.7–2.4)

## 2020-10-03 MED ORDER — LACTATED RINGERS IV SOLN: INTRAVENOUS | Status: DC

## 2020-10-03 MED ORDER — SODIUM CHLORIDE 0.9% IV SOLUTION: Freq: Once | INTRAVENOUS | Status: AC

## 2020-10-03 MED ORDER — INFLUENZA VAC A&B SA ADJ QUAD 0.5 ML IM PRSY
0.5000 mL | PREFILLED_SYRINGE | INTRAMUSCULAR | Status: AC
  Administered 2020-10-04: 0.5 mL via INTRAMUSCULAR
  Filled 2020-10-03: qty 0.5

## 2020-10-03 MED ORDER — CARBIDOPA-LEVODOPA 25-100 MG PO TABS
1.0000 | ORAL_TABLET | Freq: Once | ORAL | Status: AC
  Administered 2020-10-03: 1 via ORAL
  Filled 2020-10-03: qty 1

## 2020-10-03 NOTE — ED Notes (Signed)
Messaged admitting MD regarding PO meds

## 2020-10-03 NOTE — Telephone Encounter (Signed)
Daughter called reporting that patient is in hospital and is asking if Dr Tasia Catchings has been notified and if she will "pop in to see him and look at his labs"

## 2020-10-03 NOTE — Telephone Encounter (Signed)
I dont think we are consulted yet. He was last seen in last year. If hospitalist wants [place] on board, please consult [place].

## 2020-10-03 NOTE — Plan of Care (Signed)

## 2020-10-03 NOTE — Significant Event (Signed)
Rapid Response Event Note   Reason for Call :    Initial Focused Assessment:       Interventions:    Plan of Care:     Event Summary: Dr. Inda Merlin and Bedside RN in the room when I arrived.  I was told that the patient had a "syncopal"episode.  Dr. Inda Merlin stated he needed no assistance from me at this time.  MD Notified:  Call Time:13:16 Arrival Time:13:19 End Time:13:19  Penne Lash, RN

## 2020-10-03 NOTE — Progress Notes (Signed)
OT Cancellation Note  Patient Details Name: Casey Tucker MRN: [id number] DOB: 07-Feb-1941   Cancelled Treatment:    Reason Eval/Treat Not Completed: Medical issues which prohibited therapy. Pt noted to be receiving blood transfusion. Will re-attempt next date.  Hanley Hays, MPH, MS, OTR/L ascom [phone] 10/03/20, 1:32 PM

## 2020-10-03 NOTE — Progress Notes (Signed)
SLP Note  Patient Details Name: Casey Tucker MRN: [id number] DOB: 09/24/1940   Cancelled treatment: SLP arrived to perform bedside swallow eval; pt had just had RR called for unresponsiveness d/t choking. Vitals now stable; pt alert and conversant. Discussed with pt, family and Dr. Loleta Books; will proceed with MBS for objective swallow evaluation vs clinical assessment.  Deneise Lever, [place], CCC-SLP Speech-Language Pathologist                                                                                                        Aliene Altes 10/03/2020, 1:44 PM

## 2020-10-03 NOTE — Consult Note (Signed)
Urology Consult  I have been asked to see the patient by Dr. Loleta Books, for evaluation and management of a 79m proximal right ureteral stone.  Chief Complaint: AMS  History of Present Illness: Casey Tucker a 80y.o. year old male with Parkinson's disease s/p DBS and myelodysplastic syndrome who presented to the ED yesterday with reports of altered mental status and urinary incontinence.  Admission labs notable for WBC count 22.5; creatinine 0.93 (baseline 0.7); lactate 1.3; and UA with no nitrites, microscopic hematuria, pyuria, or bacteriuria.  Urine culture pending and blood cultures pending with no growth at <24 hours. He has been afebrile but was hypotensive on admission. He was admitted due to his leukocytosis and hypotension with concerns for sepsis and started of IVF and antibiotics as below.  He underwent CTAP with contrast yesterday with findings of multiple nonobstructing right lower pole stones and a 5 mm partially obstructing proximal right ureteral calculus with normal excretion of contrast on delayed imaging. Findings also consistent with possible pneumonia.  Today patient reports a history of nephrolithiasis, all spontaneously passed, with no known history of infected stones or past stone procedures.  He denies flank pain, bladder pain, dysuria, urgency, frequency, and gross hematuria.  Today's labs notable for WBC count 7.0 and creatinine 0.64.  Per patient report, he has previously seen Dr. SBernardo Heaterin our clinic but [facility]. On chart review, he has been seen in our clinic by Dr. EJunious Silkbut not Dr. SBernardo Heater most recently on 03/01/2019, for management of BPH with LUTS and urinary incontinence.  He was previously on Flomax, finasteride, and solifenacin for these, but it appears these have been discontinued in the interim.  [facility] no known patient with  this name or DOB.  Anti-infectives (From admission, onward)   Start     Dose/Rate Route Frequency Ordered Stop   10/03/20 0200  cefTRIAXone (ROCEPHIN) 2 g in sodium chloride 0.9 % 100 mL IVPB        2 g 200 mL/hr over 30 Minutes Intravenous Every 24 hours 10/02/20 2336     10/02/20 2345  azithromycin (ZITHROMAX) 500 mg in sodium chloride 0.9 % 250 mL IVPB        500 mg 250 mL/hr over 60 Minutes Intravenous Every 24 hours 10/02/20 2336     10/02/20 1845  vancomycin (VANCOREADY) IVPB 1750 mg/350 mL        1,750 mg 175 mL/hr over 120 Minutes Intravenous  Once 10/02/20 1830 10/02/20 2330   10/02/20 1830  ceFEPIme (MAXIPIME) 2 g in sodium chloride 0.9 % 100 mL IVPB        2 g 200 mL/hr over 30 Minutes Intravenous  Once 10/02/20 1821 10/02/20 1912   10/02/20 1830  metroNIDAZOLE (FLAGYL) IVPB 500 mg        500 mg 100 mL/hr over 60 Minutes Intravenous  Once 10/02/20 1821 10/02/20 2045   10/02/20 1830  vancomycin (VANCOCIN) IVPB 1000 mg/200 mL premix  Status:  Discontinued        1,000 mg 200 mL/hr over 60 Minutes Intravenous  Once 10/02/20 1821 10/02/20 1830      Past Medical History:  Diagnosis Date  . Arthritis    left hip   . GERD (gastroesophageal reflux disease) Pre 2002   Mild  . History of kidney stones   . 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 Parkinson disease ([place]) 2004   Slowly progressive  . Ruptured appendix teens    Past Surgical History:  Procedure Laterality Date  . APPENDECTOMY    . CATARACT EXTRACTION W/ INTRAOCULAR LENS  IMPLANT, BILATERAL  2017  . DEEP BRAIN STIMULATOR PLACEMENT  8/14   L STN  . DOPPLER ECHOCARDIOGRAPHY  01/10/2009   LV NML Mild LVH EF 60-65% aortic sclerosis w/0 stenosis   . JOINT REPLACEMENT     left hip replacement 08/14/11/[place]   . SUBTHALAMIC STIMULATOR BATTERY REPLACEMENT Left 03/25/2017   Procedure: Deep Brain Stimulator battery replacement;  Surgeon:  Erline Levine, MD;  Location: [place];  Service: Neurosurgery;  Laterality: Left;  left  . TONSILLECTOMY    . TOTAL HIP REVISION  08/27/2011   Procedure: TOTAL HIP REVISION;  Surgeon: Mauri Pole, MD;  Location: [facility];  Service: Orthopedics;  Laterality: Left;    Home Medications:  No outpatient medications have been marked as taking for the 10/02/20 encounter [facility] Encounter).    Allergies:  Allergies  Allergen Reactions  . Amoxicillin   . Celebrex [Celecoxib]   . Demerol Other (See Comments)    Hallucinations   . Penicillins     REACTION: RASH    Family History  Problem Relation Age of Onset  . Arthritis Mother        knee replacement  . COPD Father        emphysema, smoker  . Heart disease Father        CHF  . Healthy Sister   . Alcohol abuse Paternal Uncle   . Rheum arthritis Daughter     Social History:  reports that he has never smoked. He has never used smokeless tobacco. He reports current alcohol use of about 7.0 standard drinks of alcohol per week. He reports that he does not use drugs.  ROS: A complete review of systems was performed.  All systems are negative except for pertinent findings as noted.  Physical Exam:  Vital signs in last 24 hours: Temp:  [98 F (36.7 C)-98.1 F (36.7 C)] 98.1 F (36.7 C) (03/17 0830) Pulse Rate:  [57-135] 64 (03/17 0830) Resp:  [14-19] 16 (03/17 0830) BP: (94-122)/(41-72) 122/58 (03/17 0830) SpO2:  [94 %-100 %] 96 % (03/17 0830) Constitutional:  Alert, no acute distress HEENT: [place] AT, moist mucus membranes. Cardiovascular: No clubbing, cyanosis, or edema. Respiratory: Normal respiratory effort Neurologic: Grossly intact, no focal deficits, moving all 4 extremities Psychiatric: Normal mood and affect  Laboratory Data:  Recent Labs    10/02/20 1711 10/03/20 0429  WBC 22.5* 7.0  HGB 7.7* 6.7*  HCT 23.2* 21.0*   Recent Labs    10/02/20 1711 10/03/20 0429  NA 134* 137  K 4.3 4.0  CL 101 107  CO2 27 25   GLUCOSE 119* 91  BUN 23 18  CREATININE 0.93 0.64  CALCIUM 8.3* 7.8*   Recent Labs    10/02/20 1916  INR 1.2   Urinalysis    Component Value Date/Time   COLORURINE YELLOW (A) 10/02/2020 1916   APPEARANCEUR CLEAR (A) 10/02/2020 1916   APPEARANCEUR Clear 08/12/2018 1412   LABSPEC 1.014 10/02/2020 1916   LABSPEC 1.027 08/13/2011 1127   PHURINE 7.0 10/02/2020 1916   GLUCOSEU NEGATIVE 10/02/2020 1916   GLUCOSEU Negative 08/13/2011 [place] 10/02/2020 [place] 10/02/2020 1916   BILIRUBINUR Negative 08/12/2018 1412   BILIRUBINUR Negative 08/13/2011 1127  KETONESUR 20 (A) 10/02/2020 1916   PROTEINUR NEGATIVE 10/02/2020 1916   UROBILINOGEN 0.2 01/21/2018 1049   NITRITE NEGATIVE 10/02/2020 1916   LEUKOCYTESUR NEGATIVE 10/02/2020 1916   LEUKOCYTESUR Negative 08/13/2011 1127   Results for orders placed or performed during the hospital encounter of 10/02/20  Blood culture (routine x 2)     Status: None (Preliminary result)   Collection Time: 10/02/20  5:11 PM   Specimen: BLOOD  Result Value Ref Range Status   Specimen Description BLOOD LEFT ANTECUBITAL  Final   Special Requests   Final    BOTTLES DRAWN AEROBIC AND ANAEROBIC Blood Culture adequate volume   Culture   Final    NO GROWTH < 24 HOURS Performed at [facility], [address] [place], [place] [address]    Report Status PENDING  Incomplete  Resp Panel by RT-PCR (Flu A&B, Covid) Nasopharyngeal Swab     Status: None   Collection Time: 10/02/20  7:16 PM   Specimen: Nasopharyngeal Swab; Nasopharyngeal(NP) swabs in vial transport medium  Result Value Ref Range Status   SARS Coronavirus 2 by RT PCR NEGATIVE NEGATIVE Final    Comment: (NOTE) SARS-CoV-2 target nucleic acids are NOT DETECTED.  The SARS-CoV-2 RNA is generally detectable in upper respiratory specimens during the acute phase of infection. The lowest concentration of SARS-CoV-2 viral copies this assay can detect  is 138 copies/mL. A negative result does not preclude SARS-Cov-2 infection and should not be used as the sole basis for treatment or other patient management decisions. A negative result may occur with  improper specimen collection/handling, submission of specimen other than nasopharyngeal swab, presence of viral mutation(s) within the areas targeted by this assay, and inadequate number of viral copies(<138 copies/mL). A negative result must be combined with clinical observations, patient history, and epidemiological information. The expected result is Negative.  Fact Sheet for Patients:  EntrepreneurPulse.com.au  Fact Sheet for Healthcare Providers:  IncredibleEmployment.be  This test is no t yet approved or cleared by the [place] FDA and  has been authorized for detection and/or diagnosis of SARS-CoV-2 by FDA under an Emergency Use Authorization (EUA). This EUA will remain  in effect (meaning this test can be used) for the duration of the COVID-19 declaration under Section 564(b)(1) of the Act, 21 U.S.C.section 360bbb-3(b)(1), unless the authorization is terminated  or revoked sooner.       Influenza A by PCR NEGATIVE NEGATIVE Final   Influenza B by PCR NEGATIVE NEGATIVE Final    Comment: (NOTE) The Xpert Xpress SARS-CoV-2/FLU/RSV plus assay is intended as an aid in the diagnosis of influenza from Nasopharyngeal swab specimens and should not be used as a sole basis for treatment. Nasal washings and aspirates are unacceptable for Xpert Xpress SARS-CoV-2/FLU/RSV testing.  Fact Sheet for Patients: EntrepreneurPulse.com.au  Fact Sheet for Healthcare Providers: IncredibleEmployment.be  This test is not yet approved or cleared by the [place] FDA and has been authorized for detection and/or diagnosis of SARS-CoV-2 by FDA under an Emergency Use Authorization (EUA). This EUA will remain in effect  (meaning this test can be used) for the duration of the COVID-19 declaration under Section 564(b)(1) of the Act, 21 U.S.C. section 360bbb-3(b)(1), unless the authorization is terminated or revoked.  Performed at [facility], [address] [place], [place] [address]   Blood culture (routine x 2)     Status: None (Preliminary result)   Collection Time: 10/02/20  7:31 PM   Specimen: BLOOD  Result Value Ref Range  Status   Specimen Description BLOOD BLOOD RIGHT FOREARM  Final   Special Requests   Final    BOTTLES DRAWN AEROBIC AND ANAEROBIC Blood Culture results may not be optimal due to an inadequate volume of blood received in culture bottles   Culture   Final    NO GROWTH < 12 HOURS Performed at [facility], [address] [place], [place] [address]    Report Status PENDING  Incomplete    Radiologic Imaging: CT ABDOMEN PELVIS W CONTRAST  Result Date: 10/02/2020 CLINICAL DATA:  Cholelithiasis, constipation EXAM: CT ABDOMEN AND PELVIS WITH CONTRAST TECHNIQUE: Multidetector CT imaging of the abdomen and pelvis was performed using the standard protocol following bolus administration of intravenous contrast. CONTRAST:  193m OMNIPAQUE IOHEXOL 300 MG/ML  SOLN COMPARISON:  02/02/2005, 10/02/2020 FINDINGS: Lower chest: Ground-glass airspace disease is seen throughout the visualized right lung base. Bibasilar areas of subpleural scarring are noted. No effusion or pneumothorax. Hepatobiliary: No focal liver abnormality is seen. No gallstones, gallbladder wall thickening, or biliary dilatation. Pancreas: Unremarkable. No pancreatic ductal dilatation or surrounding inflammatory changes. Spleen: Normal in size without focal abnormality. Adrenals/Urinary Tract: There are multiple nonobstructing less than 4 mm calculi within the lower pole right kidney. Additionally, there is a 5 mm partially obstructing proximal right ureteral calculus, with mild right-sided obstructive uropathy.  Normal excretion of contrast on delayed imaging. There are bilateral peripelvic renal cysts. The kidneys enhance normally and symmetrically. The adrenals and bladder are unremarkable. Stomach/Bowel: No bowel obstruction or ileus. Moderate retained stool throughout the colon. The appendix is surgically absent. No bowel wall thickening or inflammatory change. Vascular/Lymphatic: Aortic atherosclerosis. No enlarged abdominal or pelvic lymph nodes. Reproductive: Prostate is unremarkable. Other: No free fluid or free gas.  No abdominal wall hernia. Musculoskeletal: No acute or destructive bony lesions. Unremarkable left hip arthroplasty. Reconstructed images demonstrate no additional findings. IMPRESSION: 1. 5 mm proximal right ureteral calculus, with mild right-sided hydronephrosis. The calculus is nonobstructing, as normal excretion of contrast can be seen traversing the calculus on delayed imaging. 2. Other nonobstructing less than 4 mm right renal calculi, corresponding to the calcifications seen on previous x-ray. 3. Ground-glass airspace disease throughout the right lung base. Findings could reflect infection, inflammation, or aspiration. Asymmetric edema is thought to be unlikely. 4. Aortic Atherosclerosis (ICD10-I70.0) and Emphysema (ICD10-J43.9). Electronically Signed   By: MRanda NgoM.D.   On: 10/02/2020 23:13   Assessment & Plan:  80year old male with Parkinson's disease, myelodysplastic syndrome, and nephrolithiasis admitted with altered mental status and concern for sepsis with incidentally found 5 mm partially obstructing proximal right ureteral stone on cross-sectional imaging without evidence of infection.  Creatinine at baseline and leukocytosis resolved today on IV antibiotics and supportive care.  Patient is completely asymptomatic of his kidney stone and reports spontaneous passage of several stones in the past not requiring further intervention.  No indication for urgent intervention at  this incidental proximal right ureteral stone today, okay to advance diet.  I recommend outpatient follow-up in our clinic in 3 to 4 weeks with KUB prior to evaluate for persistence versus clearance of the ureteral stone.  I do not recommend starting Flomax for MET with his history of Parkinson's and concern for increased fall risk.  Thank you for involving me in this patient's care.  Please contact urology if the patient acutely decompensates or becomes symptomatic of his ureteral stone.  SDebroah Loop PA-C 10/03/2020 8:41 AM

## 2020-10-03 NOTE — Progress Notes (Signed)
[facility] PROGRESS NOTE    Casey Tucker  ONG:[id number] DOB: 06-14-1941 DOA: 10/02/2020 PCP: [name], MD      Brief Narrative:  Casey Tucker is a 80 y.o. M with hx Parkinson's s/p deep brain stimulator 2014, dementia, spinal stenosis with chronic pain and MDS followed by Dr. Tasia Catchings who presented with being found down and unresponsive.  Evidently was fatigued for a few days, progressively, then found unresponsive by family/caregiver.  Gradually more alert with EMS en route.  In the ER, anemia worse to 7.7 g/dL (from baseline 9), Cr and electrolytes normal.  WBC 22K, platelets normal. Dimer normal.CT head unremarkable.  CT abdomen and pelvis showed kidney stone without hydro or pyelo but also did show LLL pneumonia.  Started on antibiotics and admitted.       Assessment & Plan:  LLL pneumonia in setting of MDS Sepsis ruled out.  I would call this PSI 88 at a minimumwhich is risk class III, but in light of his dementia, poor functional status, and myelodysplastic syndrome, I think the risk fo outpatient treatment is too high.  -Continue ceftriaxone and azithromycin, day 2 of 5   Collapse Possible syncope in setting of pneumonia.  No further cardiac work up needed.  Symptomatic anemia Myelodysplastic syndrome Hgb down to 6.7 today with dilution. FOBT reportedly negative.  Iron sat low. Without neutropenia or thrombocytopenia, doubt AML transformation, but will check smear -Check smear -Check FOBT -Transfuse 1 unit -Outpatient Heme follow up   Parkinson's disease Dementia -Continue Sinemet  Chronic pain -Continue gabapentin   Moderate protein calorie malnutrition As evidenced by reduced mustcle mass and fat, chronic illness parkinsons and reduce oral intake per daughter -Consult nutrition        Disposition: Status is: Observation at present  The patient will require care spanning > 2 midnights and should be moved to inpatient  because: Inpatient level of care appropriate due to severity of illness given (as above) PSI 88, plus myelodysplastic syndrome with anemia requiring transfusion today, dementia, and poor functional status.    Dispo: The patient is from: Home              Anticipated d/c is to: Home              Patient currently is not medically stable to d/c.   Difficult to place patient No       Level of care: Med-Surg       MDM: The below labs and imaging reports were reviewed and summarized above.  Medication management as above.    DVT prophylaxis: SCDs Start: 10/02/20 1939  Code Status: FULL Family Communication: daughter by phone, Daughter should be first call     Procedures:   3/16 CT head and c spine -- unremarkable  3/16 cXR - streaky opacity  3/16 abd x-ray -- nephrolith  3/16 carotids US =- <50% stenosis bilat  3/16 CT abd/pel -- nonobstrucint nephrolith, but also LLL pneumonia  Antimicrobials:   Vanc/Flagyl x1 3/16  Rocephin/Azithro 3/16 >>   Culture data:   3/16 blood culture x2 -- NGTD           Subjective: Patient has no headache, chest pain, dyspenea.  No leg swelling.  Denies fatigue, btu appears tired and weak.  Tremor noted.  Confused at baseline.  No fever, no sputum or cough.        Objective: Vitals:   10/03/20 0715 10/03/20 0730 10/03/20 0800 10/03/20 0830  BP: Marland Kitchen)  114/58 (!) 115/56 (!) 122/58 (!) 122/58  Pulse:    64  Resp: 14 14 14 16   Temp:    98.1 F (36.7 C)  TempSrc:    Oral  SpO2:    96%    Intake/Output Summary (Last 24 hours) at 10/03/2020 1119 Last data filed at 10/02/2020 2326 Gross per 24 hour  Intake 200 ml  Output 400 ml  Net -200 ml   There were no vitals filed for this visit.  Examination: General appearance: elderly adult male, awake and in no acute distress.   HEENT: Anicteric, conjunctiva pink, lids and lashes normal. No nasal deformity, discharge, epistaxis.  Lips moist, dentition in good repair, OP  moist no oral lesions, hearing diminisehd.   Skin: Warm and dry.  No jaundice.  No suspicious rashes or lesions.  Scattered ecchymosis typical for age Cardiac: RRR, nl K1-S0, soft systolic murmur appreciated.  Capillary refill is brisk.  JVP normal.  No LE edema.  Radial pulses 2+ and symmetric. Respiratory: Normal respiratory rate and rhythm.  CTAB without rales or wheezes. Abdomen: Abdomen soft.  No TTP or gaurding. No ascites, distension, hepatosplenomegaly.   MSK: No deformities or effusions.  Diffuse loss of muscle mass and fat. Neuro: Awake and alert but only oriented to self and "hospital", has sheet in front of him with phone numbers of family.  EOMI, moves all extremities. Speech dysarthric at baseline.   Tremor noted. Psych: Sensorium intact and responding to questions, attention normal. Affect anxious.  Judgment and insight appear impaired.    Data Reviewed: I have personally reviewed following labs and imaging studies:  CBC: Recent Labs  Lab 10/02/20 1711 10/03/20 0429  WBC 22.5* 7.0  NEUTROABS 20.7* 5.1  HGB 7.7* 6.7*  HCT 23.2* 21.0*  MCV 117.8* 122.8*  PLT 285 109   Basic Metabolic Panel: Recent Labs  Lab 10/02/20 1711 10/03/20 0429  NA 134* 137  K 4.3 4.0  CL 101 107  CO2 27 25  GLUCOSE 119* 91  BUN 23 18  CREATININE 0.93 0.64  CALCIUM 8.3* 7.8*  MG 2.2 2.1  PHOS 3.0 3.1   GFR: CrCl cannot be calculated (Unknown ideal weight.). Liver Function Tests: Recent Labs  Lab 10/02/20 1711 10/03/20 0429  AST 8* 9*  ALT <5 6  ALKPHOS 51 46  BILITOT 2.0* 1.3*  PROT 5.8* 5.4*  ALBUMIN 3.5 3.1*   No results for input(s): LIPASE, AMYLASE in the last 168 hours. No results for input(s): AMMONIA in the last 168 hours. Coagulation Profile: Recent Labs  Lab 10/02/20 1916  INR 1.2   Cardiac Enzymes: Recent Labs  Lab 10/02/20 1711  CKTOTAL 24*   BNP (last 3 results) No results for input(s): PROBNP in the last 8760 hours. HbA1C: No results for  input(s): HGBA1C in the last 72 hours. CBG: No results for input(s): GLUCAP in the last 168 hours. Lipid Profile: No results for input(s): CHOL, HDL, LDLCALC, TRIG, CHOLHDL, LDLDIRECT in the last 72 hours. Thyroid Function Tests: Recent Labs    10/02/20 1711 10/03/20 0429  TSH 2.899 4.029  FREET4 0.89  --    Anemia Panel: Recent Labs    10/02/20 1711 10/02/20 1916  VITAMINB12  --  760  FOLATE  --  8.7  FERRITIN  --  280  TIBC  --  244*  IRON  --  17*  RETICCTPCT 2.0  --    Urine analysis:    Component Value Date/Time   COLORURINE YELLOW (A) 10/02/2020  [place] (A) 10/02/2020 1916   APPEARANCEUR Clear 08/12/2018 1412   LABSPEC 1.014 10/02/2020 1916   LABSPEC 1.027 08/13/2011 1127   PHURINE 7.0 10/02/2020 1916   GLUCOSEU NEGATIVE 10/02/2020 1916   GLUCOSEU Negative 08/13/2011 1127   HGBUR NEGATIVE 10/02/2020 1916   BILIRUBINUR NEGATIVE 10/02/2020 1916   BILIRUBINUR Negative 08/12/2018 1412   BILIRUBINUR Negative 08/13/2011 1127   KETONESUR 20 (A) 10/02/2020 1916   PROTEINUR NEGATIVE 10/02/2020 1916   UROBILINOGEN 0.2 01/21/2018 1049   NITRITE NEGATIVE 10/02/2020 1916   LEUKOCYTESUR NEGATIVE 10/02/2020 1916   LEUKOCYTESUR Negative 08/13/2011 1127   Sepsis Labs: @LABRCNTIP (procalcitonin:4,lacticacidven:4)  ) Recent Results (from the past 240 hour(s))  Blood culture (routine x 2)     Status: None (Preliminary result)   Collection Time: 10/02/20  5:11 PM   Specimen: BLOOD  Result Value Ref Range Status   Specimen Description BLOOD LEFT ANTECUBITAL  Final   Special Requests   Final    BOTTLES DRAWN AEROBIC AND ANAEROBIC Blood Culture adequate volume   Culture   Final    NO GROWTH < 24 HOURS Performed at [facility], [address], [place], [place] [address]    Report Status PENDING  Incomplete  Resp Panel by RT-PCR (Flu A&B, Covid) Nasopharyngeal Swab     Status: None   Collection Time: 10/02/20  7:16 PM   Specimen:  Nasopharyngeal Swab; Nasopharyngeal(NP) swabs in vial transport medium  Result Value Ref Range Status   SARS Coronavirus 2 by RT PCR NEGATIVE NEGATIVE Final    Comment: (NOTE) SARS-CoV-2 target nucleic acids are NOT DETECTED.  The SARS-CoV-2 RNA is generally detectable in upper respiratory specimens during the acute phase of infection. The lowest concentration of SARS-CoV-2 viral copies this assay can detect is 138 copies/mL. A negative result does not preclude SARS-Cov-2 infection and should not be used as the sole basis for treatment or other patient management decisions. A negative result may occur with  improper specimen collection/handling, submission of specimen other than nasopharyngeal swab, presence of viral mutation(s) within the areas targeted by this assay, and inadequate number of viral copies(<138 copies/mL). A negative result must be combined with clinical observations, patient history, and epidemiological information. The expected result is Negative.  Fact Sheet for Patients:  EntrepreneurPulse.com.au  Fact Sheet for Healthcare Providers:  IncredibleEmployment.be  This test is no t yet approved or cleared by the [place] FDA and  has been authorized for detection and/or diagnosis of SARS-CoV-2 by FDA under an Emergency Use Authorization (EUA). This EUA will remain  in effect (meaning this test can be used) for the duration of the COVID-19 declaration under Section 564(b)(1) of the Act, 21 U.S.C.section 360bbb-3(b)(1), unless the authorization is terminated  or revoked sooner.       Influenza A by PCR NEGATIVE NEGATIVE Final   Influenza B by PCR NEGATIVE NEGATIVE Final    Comment: (NOTE) The Xpert Xpress SARS-CoV-2/FLU/RSV plus assay is intended as an aid in the diagnosis of influenza from Nasopharyngeal swab specimens and should not be used as a sole basis for treatment. Nasal washings and aspirates are unacceptable for  Xpert Xpress SARS-CoV-2/FLU/RSV testing.  Fact Sheet for Patients: EntrepreneurPulse.com.au  Fact Sheet for Healthcare Providers: IncredibleEmployment.be  This test is not yet approved or cleared by the [place] FDA and has been authorized for detection and/or diagnosis of SARS-CoV-2 by FDA under an Emergency Use Authorization (EUA). This EUA will remain in effect (meaning this test can be  used) for the duration of the COVID-19 declaration under Section 564(b)(1) of the Act, 21 U.S.C. section 360bbb-3(b)(1), unless the authorization is terminated or revoked.  Performed at [facility], [address] [place], [place] [address]   Blood culture (routine x 2)     Status: None (Preliminary result)   Collection Time: 10/02/20  7:31 PM   Specimen: BLOOD  Result Value Ref Range Status   Specimen Description BLOOD BLOOD RIGHT FOREARM  Final   Special Requests   Final    BOTTLES DRAWN AEROBIC AND ANAEROBIC Blood Culture results may not be optimal due to an inadequate volume of blood received in culture bottles   Culture   Final    NO GROWTH < 12 HOURS Performed at [facility], [address] [place], [place] [address]    Report Status PENDING  Incomplete         Radiology Studies: DG Chest 2 View  Result Date: 10/02/2020 CLINICAL DATA:  Syncope. EXAM: CHEST - 2 VIEW COMPARISON:  January 18, 2020. FINDINGS: The heart size and mediastinal contours are within normal limits. Both lungs are clear. The visualized skeletal structures are unremarkable. IMPRESSION: No active cardiopulmonary disease. Electronically Signed   By: Marijo Conception M.D.   On: 10/02/2020 18:10   DG Abd 1 View  Result Date: 10/02/2020 CLINICAL DATA:  Constipation. EXAM: ABDOMEN - 1 VIEW COMPARISON:  None. FINDINGS: No bowel dilatation to suggest obstruction. Small to moderate colonic stool burden. No abnormal rectal distention. Small calcifications project  in the right abdomen at the level L3-L4 level. Telemetry leads overlie the abdomen in this region. Left hip arthroplasty. Multilevel degenerative change in the spine. There are vascular calcifications. IMPRESSION: 1. Nonobstructive bowel gas pattern. Small to moderate colonic stool burden. 2. Small calcifications in the right abdomen at the level of L3-L4, indeterminate. These may represent stones in the lower right kidney, gallstones, or overlying artifact. Electronically Signed   By: Keith Rake M.D.   On: 10/02/2020 19:40   CT Head Wo Contrast  Result Date: 10/02/2020 CLINICAL DATA:  Dizziness, nonspecific. Neck trauma. Additional history provided: Unresponsive at home, weakness for several days, history of Graves and Parkinson's. EXAM: CT HEAD WITHOUT CONTRAST CT CERVICAL SPINE WITHOUT CONTRAST TECHNIQUE: Multidetector CT imaging of the head and cervical spine was performed following the standard protocol without intravenous contrast. Multiplanar CT image reconstructions of the cervical spine were also generated. COMPARISON:  Head CT 02/07/2020.  Brain MRI 03/06/2015. FINDINGS: CT HEAD FINDINGS Brain: Unchanged position of a left frontal approach deep brain stimulator which terminates in the left subthalamic region. There is no acute intracranial hemorrhage. No demarcated cortical infarct. No extra-axial fluid collection. No evidence of intracranial mass. No midline shift. Vascular: No hyperdense vessel.  Atherosclerotic calcifications. Skull: Left frontoparietal burr hole at site of DBS lead entry. No calvarial fracture or focal suspicious osseous lesion. Sinuses/Orbits: Visualized orbits show no acute finding. No significant paranasal sinus disease at the imaged levels. CT CERVICAL SPINE FINDINGS Alignment: Straightening of the expected cervical lordosis. 2 mm C4-C5 grade 1 anterolisthesis. Skull base and vertebrae: The basion-dental and atlanto-dental intervals are maintained.No evidence of acute  fracture to the cervical spine. Soft tissues and spinal canal: No prevertebral fluid or swelling. No visible canal hematoma. Disc levels: Cervical spondylosis. Most notably at C5-C6, there is moderate to moderately advanced disc space narrowing with a disc bulge, endplate spurring and uncovertebral hypertrophy. Bilateral bony neural foraminal narrowing and apparent mild spinal canal  stenosis at this level. Upper chest: No consolidation within the imaged lung apices. No visible pneumothorax. Other: A deep brain stimulator lead traverses the left neck. IMPRESSION: CT head: 1. No evidence of acute intracranial abnormality. 2. Unchanged position of a left frontal approach deep brain stimulator lead. CT cervical spine: 1. No evidence of acute fracture to the cervical spine. 2. 2 mm C4-C5 grade 1 anterolisthesis. 3. Cervical spondylosis, greatest at C5-C6. Electronically Signed   By: Kellie Simmering DO   On: 10/02/2020 17:56   CT Cervical Spine Wo Contrast  Result Date: 10/02/2020 CLINICAL DATA:  Dizziness, nonspecific. Neck trauma. Additional history provided: Unresponsive at home, weakness for several days, history of Graves and Parkinson's. EXAM: CT HEAD WITHOUT CONTRAST CT CERVICAL SPINE WITHOUT CONTRAST TECHNIQUE: Multidetector CT imaging of the head and cervical spine was performed following the standard protocol without intravenous contrast. Multiplanar CT image reconstructions of the cervical spine were also generated. COMPARISON:  Head CT 02/07/2020.  Brain MRI 03/06/2015. FINDINGS: CT HEAD FINDINGS Brain: Unchanged position of a left frontal approach deep brain stimulator which terminates in the left subthalamic region. There is no acute intracranial hemorrhage. No demarcated cortical infarct. No extra-axial fluid collection. No evidence of intracranial mass. No midline shift. Vascular: No hyperdense vessel.  Atherosclerotic calcifications. Skull: Left frontoparietal burr hole at site of DBS lead entry. No  calvarial fracture or focal suspicious osseous lesion. Sinuses/Orbits: Visualized orbits show no acute finding. No significant paranasal sinus disease at the imaged levels. CT CERVICAL SPINE FINDINGS Alignment: Straightening of the expected cervical lordosis. 2 mm C4-C5 grade 1 anterolisthesis. Skull base and vertebrae: The basion-dental and atlanto-dental intervals are maintained.No evidence of acute fracture to the cervical spine. Soft tissues and spinal canal: No prevertebral fluid or swelling. No visible canal hematoma. Disc levels: Cervical spondylosis. Most notably at C5-C6, there is moderate to moderately advanced disc space narrowing with a disc bulge, endplate spurring and uncovertebral hypertrophy. Bilateral bony neural foraminal narrowing and apparent mild spinal canal stenosis at this level. Upper chest: No consolidation within the imaged lung apices. No visible pneumothorax. Other: A deep brain stimulator lead traverses the left neck. IMPRESSION: CT head: 1. No evidence of acute intracranial abnormality. 2. Unchanged position of a left frontal approach deep brain stimulator lead. CT cervical spine: 1. No evidence of acute fracture to the cervical spine. 2. 2 mm C4-C5 grade 1 anterolisthesis. 3. Cervical spondylosis, greatest at C5-C6. Electronically Signed   By: Kellie Simmering DO   On: 10/02/2020 17:56   CT ABDOMEN PELVIS W CONTRAST  Result Date: 10/02/2020 CLINICAL DATA:  Cholelithiasis, constipation EXAM: CT ABDOMEN AND PELVIS WITH CONTRAST TECHNIQUE: Multidetector CT imaging of the abdomen and pelvis was performed using the standard protocol following bolus administration of intravenous contrast. CONTRAST:  119mL OMNIPAQUE IOHEXOL 300 MG/ML  SOLN COMPARISON:  02/02/2005, 10/02/2020 FINDINGS: Lower chest: Ground-glass airspace disease is seen throughout the visualized right lung base. Bibasilar areas of subpleural scarring are noted. No effusion or pneumothorax. Hepatobiliary: No focal liver  abnormality is seen. No gallstones, gallbladder wall thickening, or biliary dilatation. Pancreas: Unremarkable. No pancreatic ductal dilatation or surrounding inflammatory changes. Spleen: Normal in size without focal abnormality. Adrenals/Urinary Tract: There are multiple nonobstructing less than 4 mm calculi within the lower pole right kidney. Additionally, there is a 5 mm partially obstructing proximal right ureteral calculus, with mild right-sided obstructive uropathy. Normal excretion of contrast on delayed imaging. There are bilateral peripelvic renal cysts. The kidneys enhance normally and symmetrically. The  adrenals and bladder are unremarkable. Stomach/Bowel: No bowel obstruction or ileus. Moderate retained stool throughout the colon. The appendix is surgically absent. No bowel wall thickening or inflammatory change. Vascular/Lymphatic: Aortic atherosclerosis. No enlarged abdominal or pelvic lymph nodes. Reproductive: Prostate is unremarkable. Other: No free fluid or free gas.  No abdominal wall hernia. Musculoskeletal: No acute or destructive bony lesions. Unremarkable left hip arthroplasty. Reconstructed images demonstrate no additional findings. IMPRESSION: 1. 5 mm proximal right ureteral calculus, with mild right-sided hydronephrosis. The calculus is nonobstructing, as normal excretion of contrast can be seen traversing the calculus on delayed imaging. 2. Other nonobstructing less than 4 mm right renal calculi, corresponding to the calcifications seen on previous x-ray. 3. Ground-glass airspace disease throughout the right lung base. Findings could reflect infection, inflammation, or aspiration. Asymmetric edema is thought to be unlikely. 4. Aortic Atherosclerosis (ICD10-I70.0) and Emphysema (ICD10-J43.9). Electronically Signed   By: Randa Ngo M.D.   On: 10/02/2020 23:13   US Carotid Bilateral  Result Date: 10/02/2020 CLINICAL DATA:  Syncope, dizziness, weakness for several days EXAM: BILATERAL  CAROTID DUPLEX ULTRASOUND TECHNIQUE: Pearline Cables scale imaging, color Doppler and duplex ultrasound were performed of bilateral carotid and vertebral arteries in the neck. COMPARISON:  None. FINDINGS: Criteria: Quantification of carotid stenosis is based on velocity parameters that correlate the residual internal carotid diameter with NASCET-based stenosis levels, using the diameter of the distal internal carotid lumen as the denominator for stenosis measurement. The following velocity measurements were obtained: RIGHT ICA: 79/12 cm/sec CCA: 662/94 cm/sec SYSTOLIC ICA/CCA RATIO:  0.6 ECA: 182 cm/sec LEFT ICA: 85/17 cm/sec CCA: 765/46 cm/sec SYSTOLIC ICA/CCA RATIO:  0.8 ECA: 154 cm/sec RIGHT VERTEBRAL ARTERY:  Antegrade LEFT VERTEBRAL ARTERY:  Antegrade IMPRESSION: 1. Estimated 0-49% stenosis within the bilateral internal carotid arteries. Electronically Signed   By: Randa Ngo M.D.   On: 10/02/2020 21:04        Scheduled Meds: . sodium chloride   Intravenous Once  . carbidopa-levodopa  1 tablet Oral 5 X Daily   And  . carbidopa-levodopa  1 tablet Oral Daily  . carbidopa-levodopa  1 tablet Oral Once  . gabapentin  100-200 mg Oral TID   Continuous Infusions: . azithromycin Stopped (10/03/20 0051)  . cefTRIAXone (ROCEPHIN)  IV Stopped (10/03/20 5035)     LOS: 0 days    Time spent: 35 muintues    Edwin Dada, MD [facility] 10/03/2020, 11:19 AM     Please page though [place] or Epic secure chat:  For [employer], Adult nurse

## 2020-10-03 NOTE — ED Notes (Signed)
Patient transferred by CNA to C-Pod. Received report from [employer].

## 2020-10-03 NOTE — Telephone Encounter (Signed)
Patient's daughter, Gaylyn Rong from [place].  Patient's at [facility].  Patient has been passing out quite a bit and he stops breathing. [name] wanted to make sure Dr.Letvak was aware patient's in the hospital. I let her know Arvilla Market is notified when patient is in the hospital.

## 2020-10-03 NOTE — Plan of Care (Signed)
Patient was receiving 1 unit of blood while he was eating lunch.  Family was in room and came to the RN desk to report something was wrong.  Staff found patient pale, cold and non-responsive. Neither sternal rub or calling patient's name could arouse him.  It also appeared that no pulse or resp were occurring. A Dr was in the room almost immediately and code cart brought in too.  After about 1 minute Dr. Did find pulse at groin and patient began to wake up.  No compressions were completed and patient came back by himself.    Blood was stopped per Dr. Sharlyne Pacas (out of precaution).  Vitals stable before left room.  SLP ordered and also completed Modified Barium swallow screen.  NPO orders placed due to extreme aspiration noted.

## 2020-10-03 NOTE — Progress Notes (Signed)
Called to bedside by operator that patient was having an event. Per report, the patient was eating lunch, wife at bedside when he appeared to choke, became apneic, and then became unresponsive.  Pads were placed, and the patient had a pulse, although was unresponsive.    He had a period of agonal breathing, was suctioned, and gradually became more responsive and alert with stimulation.  His blood transfusion was stopped but he had no rash or hives or fever or hypotension.  Vitals were all within normal limits.    Speech therapy expedited his MBS which showed high risk of aspiration with all consistencies.

## 2020-10-03 NOTE — Telephone Encounter (Signed)
Spoke to daughter---she is in the ER where he is --with him. Discussed our concerns about chronic illness and need for more support. Hopefully, he will agree to rehab at [facility] after the time in the hospital----it would be best for he and wife to move in there indefinitely, but they have been reluctant to move from their home

## 2020-10-03 NOTE — Telephone Encounter (Signed)
lori pts daughter left v/m that pt has passed out again and WBC is extremely high;[name] hopes Dr Silvio Pate can stick his head in to see pt at [facility]. Sending note to DR Silvio Pate.

## 2020-10-03 NOTE — Progress Notes (Signed)
PT Cancellation Note  Patient Details Name: Casey Tucker MRN: [id number] DOB: [birth year]   Cancelled Treatment:    Reason Eval/Treat Not Completed: Other (comment). Patient undergoing transfusion at this time, PT to re-attempt as able.   Lieutenant Diego PT, DPT 12:39 PM,10/03/20

## 2020-10-03 NOTE — Progress Notes (Signed)
RR for patient. Upon arrival patient being cared for by staff. Cd cancelled, staff redirected me to wife who was visibly shaken,she was appreciative of visit.

## 2020-10-03 NOTE — ED Provider Notes (Signed)
-----------------------------------------   1:38 PM on 10/03/2020 -----------------------------------------  I was called to patient's bedside after he became unresponsive.  He had been eating and appeared to aspirate, then lost consciousness.  On my arrival, patient was pale with agonal breathing, femoral pulse bradycardic but palpable.  Heart rate gradually improved and patient regained consciousness, awake and alert but continually coughing.  He is now answering questions appropriately and there is no indication for intubation as he is maintaining oxygen saturations.  Hospitalist at bedside and will continue to evaluate following this episode.   Blake Divine, MD 10/03/20 1340

## 2020-10-03 NOTE — Progress Notes (Signed)
SLP Cancellation Note  Patient Details Name: CASY BRUNETTO MRN: [id number] DOB: [birth year]   Cancelled treatment:        Swallow orders received; chart reviewed. At this time pt NPO for possible urologic procedure. Will follow up for swallow evaluation when medically appropriate.  Deneise Lever, [place], CCC-SLP Speech-Language Pathologist                                                                                                 Aliene Altes 10/03/2020, 9:09 AM

## 2020-10-03 NOTE — Progress Notes (Signed)
OT Cancellation Note  Patient Details Name: Casey Tucker MRN: [id number] DOB: Feb 25, 1941   Cancelled Treatment:    Reason Eval/Treat Not Completed: Medical issues which prohibited therapy. Consult received, chart reviewed. Pt noted with Hgb 6.7. Urology consult pending, pt NPO "In case needs urologic procedure." Will hold OT evaluation at this time and re-attempt at later date/time as medically appropriate and pending updated plan of care.    Hanley Hays, MPH, MS, OTR/L ascom [phone] 10/03/20, 8:11 AM

## 2020-10-03 NOTE — Progress Notes (Signed)
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   Parkinson disease ([place]) 2004   Slowly progressive   Ruptured appendix teens   Past Surgical History:  Past Surgical History:  Procedure Laterality Date   APPENDECTOMY     CATARACT EXTRACTION W/ INTRAOCULAR LENS  IMPLANT, BILATERAL  2017   DEEP BRAIN STIMULATOR PLACEMENT  8/14   L STN   DOPPLER ECHOCARDIOGRAPHY  01/10/2009   LV NML Mild LVH EF 60-65% aortic sclerosis w/0 stenosis    JOINT REPLACEMENT     left hip replacement 08/14/11/[place]    SUBTHALAMIC STIMULATOR BATTERY REPLACEMENT Left 03/25/2017   Procedure: Deep Brain Stimulator battery replacement;  Surgeon: Erline Levine, MD;  Location: [facility];  Service: Neurosurgery;  Laterality: Left;  left   TONSILLECTOMY     TOTAL HIP REVISION  08/27/2011   Procedure: TOTAL HIP REVISION;  Surgeon: Mauri Pole, MD;  Location: [facility];  Service: Orthopedics;  Laterality: Left;   HPI: Casey Tucker is a 80 y.o. male with history of Parkinson's disease s/p DBS 2014, dementia, spinal stenosis, myelodysplastic syndrome who presented to ED after being found unresponsive by family. CXR shows left lower lobe PNA. Just prior to SLP arrival for swallow assessment, pt was eating and appeared to aspirate (observed by family) and then lost consciousness. Vital signs stabilized; discussed with pt, family and MD and opted to proceed with MBS for evaluation of swallow function. Clinical swallow evaluation found in chart  from 2019; regular/thin was recommended with therapist noting wet vocal quality after multiple or large sips of thin.   Subjective: "It was the steak."    Assessment / Plan / Recommendation  CHL IP CLINICAL IMPRESSIONS 10/03/2020  Clinical Impression Patient  presents with moderate to severe chronic oropharyngeal dysphagia and is at risk for aspiration with all consistencies. Oral stage is characterized by slow, weak, discoordinated lingual movements with oral residue which spills to the pharynx after the swallow. Swallow initiation is delayed to level of the pyriform sinuses. Pharyngeal stage noted for reduced base of tongue retraction which results in residue in the valleculae and pyriform sinuses (mild with thin liquids, increases with bolus thickeness and is moderate-severe with puree). Hyolaryngeal excursion is impaired, resulting in reduced duration of opening of the upper esophageal sphincter. As a result there is moderate residue just above the cricopharyngeal segment, increasing with thickness of bolus. This material penetrated the posterior aspect of the laryngeal vestibule after the swallow and was silently aspirated. Cued cough was weak and did not clear aspiration. Pt did not always aspirate immediately post-swallow, but did aspirate small amounts of thin, nectar and honey thick liquids in the same manner (posteriorly) throughout the study. Pt did not aspirate puree but remains at risk due to the amount of residue. Pt sensation of pharyngeal residue was also impaired; he did not initiate subsequent swallows, although when cued for effortful dry swallow, this reduced but did not eliminate residue. Chin tuck worsened aspiration. After study SLP had long  conversation with pt and family in room, using MBS images for teachback and to explain pt's dysphagia. Explained that this presentation is related to his Parkinson's disease and is not expected to improve. Pt asked if any surgery or "lozenges"  would help, and SLP explained they would not improve his muscle strength or timing. Given cognitive deficits, expect it would be difficult for him to complete swallowing exercises in such as way as to make significant improvements. SLP educated on options for managing dysphagia in context of chronic illness, aspiration risk, and role of palliative care in aiding decision-making on goals of care. We discussed options including: continuing oral diet while accepting risks of aspiration, modifying diet to dyphagia 1 (puree) and thin liquids (also with risk of aspiration, but to lessen discomfort and potential choking/airway obstruction), temporary alternative means of nutrition OR NPO except crushed meds/ ice chips and sips of water after oral care until they have additional time and discussions to weigh decisionmaking. Daughter concerned about maintaining nutrition with modified diet in the long term; would benefit from dietitian follow-up. At this time pt and family in agreement to remain NPO pending goals of care discussion, with meds crushed in puree, and ice chips/sips of thin water after oral care. Precautions posted in room; demonstrated use of suction swabs to perform oral care. MD has placed palliative consult.   SLP Visit Diagnosis Dysphagia, oropharyngeal phase (R13.12)  Attention and concentration deficit following --  Frontal lobe and executive function deficit following --  Impact on safety and function Moderate aspiration risk;Severe aspiration risk;Risk for inadequate nutrition/hydration      CHL IP TREATMENT RECOMMENDATION 10/03/2020  Treatment Recommendations Therapy as outlined in treatment plan below     Prognosis 10/03/2020  Prognosis for Safe Diet Advancement Guarded  Barriers to Reach Goals Cognitive deficits;Severity of deficits  Barriers/Prognosis Comment --    CHL IP DIET RECOMMENDATION 10/03/2020  SLP Diet Recommendations NPO except meds;Ice chips PRN after oral care  Liquid  Administration via Cup  Medication Administration Crushed with puree  Compensations Slow rate;Small sips/bites;Multiple dry swallows after each bite/sip  Postural Changes Seated upright at 90 degrees;Remain semi-upright after after feeds/meals (Comment)      CHL IP OTHER RECOMMENDATIONS 10/03/2020  Recommended Consults Other (Comment)  Oral Care Recommendations Oral care QID;Oral care before and after PO;Staff/trained caregiver to provide oral care  Other Recommendations Have oral suction available      CHL IP FOLLOW UP RECOMMENDATIONS 10/03/2020  Follow up Recommendations Other (comment)      CHL IP FREQUENCY AND DURATION 10/03/2020  Speech Therapy Frequency (ACUTE ONLY) min 2x/week  Treatment Duration 2 weeks           CHL IP ORAL PHASE 10/03/2020  Oral Phase Impaired  Oral - Pudding Teaspoon --  Oral - Pudding Cup --  Oral - Honey Teaspoon Weak lingual manipulation;Reduced posterior propulsion;Lingual/palatal residue  Oral - Honey Cup Weak lingual manipulation;Reduced posterior propulsion;Lingual/palatal residue  Oral - Nectar Teaspoon Weak lingual manipulation;Reduced posterior propulsion;Lingual/palatal residue  Oral - Nectar Cup --  Oral - Nectar Straw --  Oral - Thin Teaspoon Weak lingual manipulation;Reduced posterior propulsion;Lingual/palatal residue  Oral - Thin Cup --  Oral - Thin Straw --  Oral - Puree Weak lingual manipulation;Reduced posterior propulsion;Lingual/palatal residue  Oral - Mech Soft --  Oral - Regular --  Oral - Multi-Consistency --  Oral - Pill --  Oral Phase - Comment --    CHL IP PHARYNGEAL PHASE 10/03/2020  Pharyngeal Phase Impaired  Pharyngeal- Pudding Teaspoon --  Pharyngeal --  Pharyngeal- Pudding Cup --  Pharyngeal --  Pharyngeal- Honey Teaspoon Delayed swallow initiation-pyriform sinuses;Reduced epiglottic inversion;Reduced anterior laryngeal mobility;Reduced tongue base retraction;Penetration/Apiration after swallow;Pharyngeal residue -  valleculae;Pharyngeal residue - pyriform;Pharyngeal residue - cp segment  Pharyngeal Material does not enter airway;Material enters airway, passes BELOW cords without attempt by patient to eject out (silent aspiration)  Pharyngeal- Honey Cup Delayed swallow initiation-pyriform sinuses;Reduced epiglottic inversion;Reduced anterior laryngeal mobility;Reduced laryngeal elevation;Reduced tongue base retraction;Penetration/Apiration after swallow;Pharyngeal residue - valleculae;Pharyngeal residue - pyriform;Pharyngeal residue - cp segment  Pharyngeal Material enters airway, passes BELOW cords without attempt by patient to eject out (silent aspiration)  Pharyngeal- Nectar Teaspoon Delayed swallow initiation-pyriform sinuses;Reduced epiglottic inversion;Reduced anterior laryngeal mobility;Reduced laryngeal elevation;Reduced tongue base retraction;Penetration/Apiration after swallow;Moderate aspiration;Pharyngeal residue - valleculae;Pharyngeal residue - pyriform;Pharyngeal residue - cp segment  Pharyngeal Material enters airway, passes BELOW cords without attempt by patient to eject out (silent aspiration)  Pharyngeal- Nectar Cup --  Pharyngeal --  Pharyngeal- Nectar Straw --  Pharyngeal --  Pharyngeal- Thin Teaspoon Delayed swallow initiation-pyriform sinuses;Reduced epiglottic inversion;Reduced anterior laryngeal mobility;Reduced laryngeal elevation;Reduced tongue base retraction;Penetration/Apiration after swallow;Pharyngeal residue - valleculae;Pharyngeal residue - pyriform;Pharyngeal residue - cp segment;Moderate aspiration  Pharyngeal Material does not enter airway;Material enters airway, passes BELOW cords without attempt by patient to eject out (silent aspiration)  Pharyngeal- Thin Cup --  Pharyngeal --  Pharyngeal- Thin Straw --  Pharyngeal --  Pharyngeal- Puree Delayed swallow initiation-vallecula;Reduced epiglottic inversion;Reduced laryngeal elevation;Reduced anterior laryngeal mobility;Reduced  tongue base retraction;Pharyngeal residue - valleculae;Pharyngeal residue - pyriform;Pharyngeal residue - cp segment  Pharyngeal Material does not enter airway  Pharyngeal- Mechanical Soft --  Pharyngeal --  Pharyngeal- Regular --  Pharyngeal --  Pharyngeal- Multi-consistency --  Pharyngeal --  Pharyngeal- Pill --  Pharyngeal --  Pharyngeal Comment --     CHL IP CERVICAL ESOPHAGEAL PHASE 10/03/2020  Cervical Esophageal Phase Impaired  Pudding Teaspoon --  Pudding Cup --  Honey Teaspoon --  Honey Cup --  Nectar Teaspoon --  Nectar Cup --  Nectar Straw --  Thin Teaspoon --  Thin Cup --  Thin Straw --  Puree --  Mechanical Soft --  Regular --  Multi-consistency --  Pill --  Cervical Esophageal Comment (No Data)    Deneise Lever, MS, CCC-SLP Speech-Language Pathologist  Aliene Altes 10/03/2020, 4:33 PM

## 2020-10-04 ENCOUNTER — Telehealth: Payer: Self-pay

## 2020-10-04 ENCOUNTER — Telehealth: Payer: Self-pay | Admitting: Urology

## 2020-10-04 ENCOUNTER — Encounter: Payer: Self-pay | Admitting: Family Medicine

## 2020-10-04 DIAGNOSIS — Z515 Encounter for palliative care: Secondary | ICD-10-CM

## 2020-10-04 DIAGNOSIS — Z7189 Other specified counseling: Secondary | ICD-10-CM

## 2020-10-04 DIAGNOSIS — E43 Unspecified severe protein-calorie malnutrition: Secondary | ICD-10-CM | POA: Insufficient documentation

## 2020-10-04 LAB — LEGIONELLA PNEUMOPHILA SEROGP 1 UR AG: L. pneumophila Serogp 1 Ur Ag: NEGATIVE

## 2020-10-04 LAB — BPAM RBC
Blood Product Expiration Date: 202204082359
Blood Product Expiration Date: 202204162359
ISSUE DATE / TIME: 202203171208
ISSUE DATE / TIME: 202203171841
Unit Type and Rh: 600
Unit Type and Rh: 600

## 2020-10-04 LAB — BASIC METABOLIC PANEL
Anion gap: 5 (ref 5–15)
BUN: 12 mg/dL (ref 8–23)
CO2: 25 mmol/L (ref 22–32)
Calcium: 8.2 mg/dL — ABNORMAL LOW (ref 8.9–10.3)
Chloride: 107 mmol/L (ref 98–111)
Creatinine, Ser: 0.6 mg/dL — ABNORMAL LOW (ref 0.61–1.24)
GFR, Estimated: >60 mL/min (ref >60)
Glucose, Bld: 87 mg/dL (ref 70–99)
Potassium: 3.9 mmol/L (ref 3.5–5.1)
Sodium: 137 mmol/L (ref 135–145)

## 2020-10-04 LAB — CBC
HCT: 26.1 % — ABNORMAL LOW (ref 39.0–52.0)
Hemoglobin: 9 g/dL — ABNORMAL LOW (ref 13.0–17.0)
MCH: 37.5 pg — ABNORMAL HIGH (ref 26.0–34.0)
MCHC: 34.5 g/dL (ref 30.0–36.0)
MCV: 108.8 fL — ABNORMAL HIGH (ref 80.0–100.0)
Platelets: 265 10*3/uL (ref 150–400)
RBC: 2.4 MIL/uL — ABNORMAL LOW (ref 4.22–5.81)
RDW: 19.6 % — ABNORMAL HIGH (ref 11.5–15.5)
WBC: 6.1 10*3/uL (ref 4.0–10.5)
nRBC: 0 % (ref 0.0–0.2)

## 2020-10-04 LAB — URINE CULTURE: Culture: <10000 — AB

## 2020-10-04 LAB — TYPE AND SCREEN
ABO/RH(D): A NEG
Antibody Screen: NEGATIVE
Unit division: 0
Unit division: 0

## 2020-10-04 MED ORDER — DOCUSATE SODIUM 100 MG PO CAPS
100.0000 mg | ORAL_CAPSULE | ORAL | Status: DC
  Administered 2020-10-05: 08:00:00 100 mg via ORAL
  Filled 2020-10-04: qty 1

## 2020-10-04 MED ORDER — FERROUS SULFATE 325 (65 FE) MG PO TABS
325.0000 mg | ORAL_TABLET | ORAL | Status: DC
  Administered 2020-10-05: 325 mg via ORAL
  Filled 2020-10-04: qty 1

## 2020-10-04 MED ORDER — AZITHROMYCIN 500 MG PO TABS
500.0000 mg | ORAL_TABLET | Freq: Every day | ORAL | Status: DC
  Administered 2020-10-04 – 2020-10-05 (×2): 500 mg via ORAL
  Filled 2020-10-04 (×2): qty 1

## 2020-10-04 MED ORDER — CEFDINIR 300 MG PO CAPS
300.0000 mg | ORAL_CAPSULE | Freq: Two times a day (BID) | ORAL | Status: DC
  Filled 2020-10-04 (×2): qty 1

## 2020-10-04 MED ORDER — ADULT MULTIVITAMIN W/MINERALS CH
1.0000 | ORAL_TABLET | Freq: Every day | ORAL | Status: DC
  Administered 2020-10-05 – 2020-10-06 (×2): 1 via ORAL
  Filled 2020-10-04 (×2): qty 1

## 2020-10-04 MED ORDER — MENTHOL 3 MG MT LOZG
1.0000 | LOZENGE | OROMUCOSAL | Status: DC | PRN
  Administered 2020-10-05: 3 mg via ORAL
  Filled 2020-10-04: qty 9

## 2020-10-04 MED ORDER — ENSURE ENLIVE PO LIQD
237.0000 mL | Freq: Three times a day (TID) | ORAL | Status: DC
  Administered 2020-10-04 – 2020-10-06 (×7): 237 mL via ORAL

## 2020-10-04 MED ORDER — CEFDINIR 300 MG PO CAPS
300.0000 mg | ORAL_CAPSULE | Freq: Two times a day (BID) | ORAL | Status: DC
  Administered 2020-10-04 – 2020-10-06 (×4): 300 mg via ORAL
  Filled 2020-10-04 (×5): qty 1

## 2020-10-04 NOTE — NC FL2 (Signed)
[place] LEVEL OF CARE SCREENING TOOL     IDENTIFICATION  Patient Name: Casey Tucker Birthdate: [birth year] Sex: male Admission Date (Current Location): 10/02/2020  [place] and [place] Number:  Engineering geologist and Address:  [facility], [address] [place], [place] [address]      Provider Number: [id number]  Attending Physician Name and Address:  Edwin Dada, *  Relative Name and Phone Number:  Stormy Card [phone]    Current Level of Care: Hospital Recommended Level of Care: [place] Prior Approval Number:    Date Approved/Denied:   PASRR Number: [id number]  Discharge Plan: SNF    Current Diagnoses: Patient Active Problem List   Diagnosis Date Noted  . Protein-calorie malnutrition, severe 10/04/2020  . Left lower lobe pneumonia 10/03/2020  . Syncope and collapse 10/02/2020  . Leukocytosis 10/02/2020  . Ureteral stone with hydronephrosis 10/02/2020  . B12 deficiency 04/03/2020  . MDS (myelodysplastic syndrome) ([place]) 04/03/2020  . Goals of care, counseling/discussion 04/03/2020  . Syncope 02/07/2020  . Anemia 02/07/2020  . Leukopenia 02/07/2020  . Neurogenic urinary incontinence 11/02/2019  . Vascular dementia without behavioral disturbance ([place]) 07/04/2019  . Mood disorder ([place]) 07/04/2019  . Spinal stenosis, lumbar region with neurogenic claudication 04/07/2019  . Irregular heart beat 04/07/2019  . Malnutrition of mild degree ([place]) 04/07/2019  . Urinary frequency 01/21/2018  . Advanced directives, counseling/discussion 04/20/2014  . Routine general medical examination at a health care facility 03/16/2012  . 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 Osteoarthrosis, hip 08/12/2011  . Parkinson's disease ([place]) 01/29/2011  . GILBERT'S SYNDROME 12/09/2007  . GERD 12/09/2007  . RENAL CALCULUS, HX OF 12/09/2007    Orientation RESPIRATION BLADDER Height & Weight      Self,Time,Situation,Place  Normal Continent Weight: 68.5 kg Height:  6' (182.9 cm)  BEHAVIORAL SYMPTOMS/MOOD NEUROLOGICAL BOWEL NUTRITION STATUS      Continent    AMBULATORY STATUS COMMUNICATION OF NEEDS Skin   Extensive Assist Verbally Skin abrasions                       Personal Care Assistance Level of Assistance  Dressing,Bathing Bathing Assistance: Limited assistance   Dressing Assistance: Limited assistance     Functional Limitations Info  Speech     Speech Info: Impaired    SPECIAL CARE FACTORS FREQUENCY  PT (By licensed PT),OT (By licensed OT)     PT Frequency: 5 times per week OT Frequency: 3 times per week            Contractures Contractures Info: Not present    Additional Factors Info  Code Status,Allergies Code Status Info: Full code Allergies Info: Amoxicillin, Celebrex Celecoxib, Demerol, Penicillins           Current Medications (10/04/2020):  This is the current hospital active medication list Current Facility-Administered Medications  Medication Dose Route Frequency Provider Last Rate Last Admin  . acetaminophen (TYLENOL) tablet 650 mg  650 mg Oral Q6H PRN Toy Baker, MD       Or  . acetaminophen (TYLENOL) suppository 650 mg  650 mg Rectal Q6H PRN Doutova, Anastassia, MD      . azithromycin (ZITHROMAX) tablet 500 mg  500 mg Oral Daily Danford, Suann Larry, MD   500 mg at 10/04/20 1403  . carbidopa-levodopa (SINEMET IR) 25-100 MG per tablet immediate release 1 tablet  1 tablet Oral 5 X Daily Dorothe Pea, RPH   1  tablet at 10/04/20 1525   And  . carbidopa-levodopa (SINEMET IR) 25-100 MG per tablet immediate release 1 tablet  1 tablet Oral Daily Dorothe Pea, RPH   1 tablet at 10/04/20 7670  . cefdinir (OMNICEF) capsule 300 mg  300 mg Oral Q12H Danford, Suann Larry, MD      . gabapentin (NEURONTIN) capsule 100-200 mg  100-200 mg Oral TID Toy Baker, MD   200 mg at 10/04/20 1525  . HYDROcodone-acetaminophen  (NORCO/VICODIN) 5-325 MG per tablet 1-2 tablet  1-2 tablet Oral Q4H PRN Toy Baker, MD   2 tablet at 10/04/20 0953  . lactated ringers infusion   Intravenous Continuous Edwin Dada, MD 100 mL/hr at 10/03/20 1552 New Bag at 10/03/20 1552  . menthol-cetylpyridinium (CEPACOL) lozenge 3 mg  1 lozenge Oral PRN Drue Novel, NP         Discharge Medications: Please see discharge summary for a list of discharge medications.  Relevant Imaging Results:  Relevant Lab Results:   Additional Information SS number [id number]  Su Hilt, RN

## 2020-10-04 NOTE — Progress Notes (Signed)
[facility] PROGRESS NOTE    Casey Tucker  GMW:[id number] DOB: 08/26/1940 DOA: 10/02/2020 PCP: [name], MD      Brief Narrative:  Casey Tucker is a 80 y.o. M with hx Parkinson's s/p deep brain stimulator 2014, dementia, spinal stenosis with chronic pain and MDS followed by Dr. Tasia Catchings who presented with being found down and unresponsive.  Evidently was fatigued for a few days, progressively, then found unresponsive by family/caregiver.  Gradually more alert with EMS en route.  In the ER, anemia worse to 7.7 g/dL (from baseline 9), Cr and electrolytes normal.  WBC 22K, platelets normal. Dimer normal.CT head unremarkable.  CT abdomen and pelvis showed kidney stone without hydro or pyelo but also did show LLL pneumonia.  Started on antibiotics and admitted.       Assessment & Plan:  LLL pneumonia in setting of MDS -Continue antibiotics, transition to cefpodoxime and azithromycin PO    Dsyphagia and aspiration event Aspiration event yesterday afternoon.  In setting of possibly aspiration pneumonia, and his advancing dysphagia in the setting of Parkinson's, we are concerned.  On MBS 3/17, he was observed to aspirate all consistencies of food.  Given this, SLP and I shared with patient, daughters, wife that the patient has risk of aspiraiton pneumonia and sequelae of same with any consistency.    They have discussed together, and per POA Casey Tucker, feel it would be father's wish to avoid the most extreme approach of feeding tube, esp given this may not reduce the longterm risk of aspiration pneumonia.  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   Smear consistent with MDS, no blasts. Low suspicion for AML trans.    -Start iron qod with docusate -Outpatient Heme follow up   Parkinson's disease Dementia -Continue Sinemet   Chronic pain -Continue gabapentin   Severe protein calorie malnutrition Moderate malnutrition ruled out, as evidenced by advanced muscle mass and fat loss, thenar wasting, reduced oral intake, dysphagia, and weight loss of >20 lbs in last year.    -Consult nutrition   Collapse Possible syncope in setting of pneumonia.  No further cardiac work up needed.          Disposition: Status is: Inpatient     Dispo: The patient is from: Home              Anticipated d/c is to: Home              Patient currently is not medically stable to d/c.   Difficult to place patient No    Patient admitted with pneumonia, found here to have aspiration.    At present, he is not able to ambulate household distances without supervision, which is his baseline. I suspect this is from his anemia and pneumonia and near code event yesterday, and that he would benefit from short term rehab to gain a prior level of function.     Level of care: Med-Surg       MDM: The below labs and imaging reports were reviewed and summarized above.  Medication  management as above.    DVT prophylaxis: SCDs Start: 10/02/20 1939  Code Status: FULL Family Communication: daughter by phone, Daughter should be first call     Procedures:   3/16 CT head and c spine -- unremarkable  3/16 cXR - streaky opacity  3/16 abd x-ray -- nephrolith  3/16 carotids US =- <50% stenosis bilat  3/16 CT abd/pel -- nonobstrucint nephrolith, but also LLL pneumonia  Antimicrobials:   Vanc/Flagyl x1 3/16  Rocephin/Azithro 3/16 >>   Culture data:   3/16 blood culture x2 -- NGTD           Subjective: No further aspiration,  cough, fever, respiratory distress, dyspnea.         Objective: Vitals:   10/04/20 0751 10/04/20 1150 10/04/20 1200 10/04/20 1544  BP: (!) 105/43 105/71 (!) 167/67 (!) 155/70  Pulse: (!) 59 (!) 58 79 66  Resp: 18 18 18 16   Temp: 98.6 F (37 C) 98.4 F (36.9 C) 98.4 F (36.9 C) 97.6 F (36.4 C)  TempSrc: Oral Oral Oral Oral  SpO2: 96% 94% 94% 100%  Weight:      Height:        Intake/Output Summary (Last 24 hours) at 10/04/2020 1553 Last data filed at 10/04/2020 0600 Gross per 24 hour  Intake 1999.32 ml  Output 820 ml  Net 1179.32 ml   Filed Weights   10/03/20 1203  Weight: 68.5 kg    Examination: General appearance: Thin elderly male, sitting on edge of the bed, interactive     HEENT: Dentition in good repair, oropharynx moist, no oral lesions, hearing diminished Skin: No suspicious rashes or lesions, no joint Cardiac: Regular rate and rhythm, soft systolic murmur, no lower extremity edema Respiratory: Normal respiratory rate and rhythm, I suspect rales at bilateral lower bases, but these are faint, no wheezing. Abdomen: No tenderness palpation or guarding, no ascites or distention MSK: Severe diffuse loss of subcutaneous muscle mass and fat diffusely, thenar wasting Neuro: Awake and alert, sitting up, oriented to person, place, but seems forgetful of situation.  The patient's interactions do not consistently demonstrate ordered, logical thinking and his decision-making capacity is questioned.  In general, I do not believe the patient can consistent appreciate reasonably forseeable consequences of the proposed treatment plan or of refusing the proposed treatment plan  Psych: Sensorium intact and responding to quesitons, attention distracted, affect irritable   Data Reviewed: I have personally reviewed following labs and imaging studies:  CBC: Recent Labs  Lab 10/02/20 1711 10/03/20 0429 10/04/20 0543  WBC 22.5* 7.0 6.1  NEUTROABS 20.7* 5.1  --   HGB 7.7* 6.7*  9.0*  HCT 23.2* 21.0* 26.1*  MCV 117.8* 122.8* 108.8*  PLT 285 229 811   Basic Metabolic Panel: Recent Labs  Lab 10/02/20 1711 10/03/20 0429 10/04/20 0543  NA 134* 137 137  K 4.3 4.0 3.9  CL 101 107 107  CO2 27 25 25   GLUCOSE 119* 91 87  BUN 23 18 12   CREATININE 0.93 0.64 0.60*  CALCIUM 8.3* 7.8* 8.2*  MG 2.2 2.1  --   PHOS 3.0 3.1  --    GFR: Estimated Creatinine Clearance: 73.7 mL/min (A) (by C-G formula based on SCr of 0.6 mg/dL (L)). Liver Function Tests: Recent Labs  Lab 10/02/20 1711 10/03/20 0429  AST 8* 9*  ALT <5 6  ALKPHOS 51 46  BILITOT 2.0* 1.3*  PROT 5.8* 5.4*  ALBUMIN 3.5 3.1*   No results for input(s): LIPASE, AMYLASE in  the last 168 hours. No results for input(s): AMMONIA in the last 168 hours. Coagulation Profile: Recent Labs  Lab 10/02/20 1916  INR 1.2   Cardiac Enzymes: Recent Labs  Lab 10/02/20 1711  CKTOTAL 24*   BNP (last 3 results) No results for input(s): PROBNP in the last 8760 hours. HbA1C: No results for input(s): HGBA1C in the last 72 hours. CBG: No results for input(s): GLUCAP in the last 168 hours. Lipid Profile: No results for input(s): CHOL, HDL, LDLCALC, TRIG, CHOLHDL, LDLDIRECT in the last 72 hours. Thyroid Function Tests: Recent Labs    10/02/20 1711 10/03/20 0429  TSH 2.899 4.029  FREET4 0.89  --    Anemia Panel: Recent Labs    10/02/20 1711 10/02/20 1916  VITAMINB12  --  760  FOLATE  --  8.7  FERRITIN  --  280  TIBC  --  244*  IRON  --  17*  RETICCTPCT 2.0  --    Urine analysis:    Component Value Date/Time   COLORURINE YELLOW (A) 10/02/2020 1916   APPEARANCEUR CLEAR (A) 10/02/2020 1916   APPEARANCEUR Clear 08/12/2018 1412   LABSPEC 1.014 10/02/2020 1916   LABSPEC 1.027 08/13/2011 1127   PHURINE 7.0 10/02/2020 1916   GLUCOSEU NEGATIVE 10/02/2020 1916   GLUCOSEU Negative 08/13/2011 1127   HGBUR NEGATIVE 10/02/2020 1916   BILIRUBINUR NEGATIVE 10/02/2020 1916   BILIRUBINUR Negative 08/12/2018  1412   BILIRUBINUR Negative 08/13/2011 1127   KETONESUR 20 (A) 10/02/2020 1916   PROTEINUR NEGATIVE 10/02/2020 1916   UROBILINOGEN 0.2 01/21/2018 1049   NITRITE NEGATIVE 10/02/2020 1916   LEUKOCYTESUR NEGATIVE 10/02/2020 1916   LEUKOCYTESUR Negative 08/13/2011 1127   Sepsis Labs: @LABRCNTIP (procalcitonin:4,lacticacidven:4)  ) Recent Results (from the past 240 hour(s))  Blood culture (routine x 2)     Status: None (Preliminary result)   Collection Time: 10/02/20  5:11 PM   Specimen: BLOOD  Result Value Ref Range Status   Specimen Description BLOOD LEFT ANTECUBITAL  Final   Special Requests   Final    BOTTLES DRAWN AEROBIC AND ANAEROBIC Blood Culture adequate volume   Culture   Final    NO GROWTH 2 DAYS Performed at [facility], [address], [place], [place] [address]    Report Status PENDING  Incomplete  Resp Panel by RT-PCR (Flu A&B, Covid) Nasopharyngeal Swab     Status: None   Collection Time: 10/02/20  7:16 PM   Specimen: Nasopharyngeal Swab; Nasopharyngeal(NP) swabs in vial transport medium  Result Value Ref Range Status   SARS Coronavirus 2 by RT PCR NEGATIVE NEGATIVE Final    Comment: (NOTE) SARS-CoV-2 target nucleic acids are NOT DETECTED.  The SARS-CoV-2 RNA is generally detectable in upper respiratory specimens during the acute phase of infection. The lowest concentration of SARS-CoV-2 viral copies this assay can detect is 138 copies/mL. A negative result does not preclude SARS-Cov-2 infection and should not be used as the sole basis for treatment or other patient management decisions. A negative result may occur with  improper specimen collection/handling, submission of specimen other than nasopharyngeal swab, presence of viral mutation(s) within the areas targeted by this assay, and inadequate number of viral copies(<138 copies/mL). A negative result must be combined with clinical observations, patient history, and epidemiological information. The  expected result is Negative.  Fact Sheet for Patients:  EntrepreneurPulse.com.au  Fact Sheet for Healthcare Providers:  IncredibleEmployment.be  This test is no t yet approved or cleared by the [place] FDA and  has  been authorized for detection and/or diagnosis of SARS-CoV-2 by FDA under an Emergency Use Authorization (EUA). This EUA will remain  in effect (meaning this test can be used) for the duration of the COVID-19 declaration under Section 564(b)(1) of the Act, 21 U.S.C.section 360bbb-3(b)(1), unless the authorization is terminated  or revoked sooner.       Influenza A by PCR NEGATIVE NEGATIVE Final   Influenza B by PCR NEGATIVE NEGATIVE Final    Comment: (NOTE) The Xpert Xpress SARS-CoV-2/FLU/RSV plus assay is intended as an aid in the diagnosis of influenza from Nasopharyngeal swab specimens and should not be used as a sole basis for treatment. Nasal washings and aspirates are unacceptable for Xpert Xpress SARS-CoV-2/FLU/RSV testing.  Fact Sheet for Patients: EntrepreneurPulse.com.au  Fact Sheet for Healthcare Providers: IncredibleEmployment.be  This test is not yet approved or cleared by the [place] FDA and has been authorized for detection and/or diagnosis of SARS-CoV-2 by FDA under an Emergency Use Authorization (EUA). This EUA will remain in effect (meaning this test can be used) for the duration of the COVID-19 declaration under Section 564(b)(1) of the Act, 21 U.S.C. section 360bbb-3(b)(1), unless the authorization is terminated or revoked.  Performed at [facility], [address] [place], [place] [address]   Urine culture     Status: Abnormal   Collection Time: 10/02/20  7:16 PM   Specimen: Urine, Random  Result Value Ref Range Status   Specimen Description   Final    URINE, RANDOM Performed at [facility], [address] [place], [place]  [address]    Special Requests   Final    NONE Performed at [facility], [address] [place], [place] [address]    Culture (A)  Final    <10,000 COLONIES/mL INSIGNIFICANT GROWTH Performed at [facility], [place] [address] [place], [place] [address]    Report Status 10/04/2020 FINAL  Final  Blood culture (routine x 2)     Status: None (Preliminary result)   Collection Time: 10/02/20  7:31 PM   Specimen: BLOOD  Result Value Ref Range Status   Specimen Description BLOOD BLOOD RIGHT FOREARM  Final   Special Requests   Final    BOTTLES DRAWN AEROBIC AND ANAEROBIC Blood Culture results may not be optimal due to an inadequate volume of blood received in culture bottles   Culture   Final    NO GROWTH 2 DAYS Performed at [facility], [address] [place], [place] [address]    Report Status PENDING  Incomplete         Radiology Studies: DG Chest 2 View  Result Date: 10/02/2020 CLINICAL DATA:  Syncope. EXAM: CHEST - 2 VIEW COMPARISON:  January 18, 2020. FINDINGS: The heart size and mediastinal contours are within normal limits. Both lungs are clear. The visualized skeletal structures are unremarkable. IMPRESSION: No active cardiopulmonary disease. Electronically Signed   By: Marijo Conception M.D.   On: 10/02/2020 18:10   DG Abd 1 View  Result Date: 10/02/2020 CLINICAL DATA:  Constipation. EXAM: ABDOMEN - 1 VIEW COMPARISON:  None. FINDINGS: No bowel dilatation to suggest obstruction. Small to moderate colonic stool burden. No abnormal rectal distention. Small calcifications project in the right abdomen at the level L3-L4 level. Telemetry leads overlie the abdomen in this region. Left hip arthroplasty. Multilevel degenerative change in the spine. There are vascular calcifications. IMPRESSION: 1. Nonobstructive bowel gas pattern. Small to moderate colonic stool burden. 2. Small calcifications in  the right abdomen at the level of L3-L4, indeterminate. These may represent  stones in the lower right kidney, gallstones, or overlying artifact. Electronically Signed   By: Keith Rake M.D.   On: 10/02/2020 19:40   CT Head Wo Contrast  Result Date: 10/02/2020 CLINICAL DATA:  Dizziness, nonspecific. Neck trauma. Additional history provided: Unresponsive at home, weakness for several days, history of Graves and Parkinson's. EXAM: CT HEAD WITHOUT CONTRAST CT CERVICAL SPINE WITHOUT CONTRAST TECHNIQUE: Multidetector CT imaging of the head and cervical spine was performed following the standard protocol without intravenous contrast. Multiplanar CT image reconstructions of the cervical spine were also generated. COMPARISON:  Head CT 02/07/2020.  Brain MRI 03/06/2015. FINDINGS: CT HEAD FINDINGS Brain: Unchanged position of a left frontal approach deep brain stimulator which terminates in the left subthalamic region. There is no acute intracranial hemorrhage. No demarcated cortical infarct. No extra-axial fluid collection. No evidence of intracranial mass. No midline shift. Vascular: No hyperdense vessel.  Atherosclerotic calcifications. Skull: Left frontoparietal burr hole at site of DBS lead entry. No calvarial fracture or focal suspicious osseous lesion. Sinuses/Orbits: Visualized orbits show no acute finding. No significant paranasal sinus disease at the imaged levels. CT CERVICAL SPINE FINDINGS Alignment: Straightening of the expected cervical lordosis. 2 mm C4-C5 grade 1 anterolisthesis. Skull base and vertebrae: The basion-dental and atlanto-dental intervals are maintained.No evidence of acute fracture to the cervical spine. Soft tissues and spinal canal: No prevertebral fluid or swelling. No visible canal hematoma. Disc levels: Cervical spondylosis. Most notably at C5-C6, there is moderate to moderately advanced disc space narrowing with a disc bulge, endplate spurring and uncovertebral hypertrophy. Bilateral bony neural foraminal narrowing and apparent mild spinal canal stenosis at  this level. Upper chest: No consolidation within the imaged lung apices. No visible pneumothorax. Other: A deep brain stimulator lead traverses the left neck. IMPRESSION: CT head: 1. No evidence of acute intracranial abnormality. 2. Unchanged position of a left frontal approach deep brain stimulator lead. CT cervical spine: 1. No evidence of acute fracture to the cervical spine. 2. 2 mm C4-C5 grade 1 anterolisthesis. 3. Cervical spondylosis, greatest at C5-C6. Electronically Signed   By: Kellie Simmering DO   On: 10/02/2020 17:56   CT Cervical Spine Wo Contrast  Result Date: 10/02/2020 CLINICAL DATA:  Dizziness, nonspecific. Neck trauma. Additional history provided: Unresponsive at home, weakness for several days, history of Graves and Parkinson's. EXAM: CT HEAD WITHOUT CONTRAST CT CERVICAL SPINE WITHOUT CONTRAST TECHNIQUE: Multidetector CT imaging of the head and cervical spine was performed following the standard protocol without intravenous contrast. Multiplanar CT image reconstructions of the cervical spine were also generated. COMPARISON:  Head CT 02/07/2020.  Brain MRI 03/06/2015. FINDINGS: CT HEAD FINDINGS Brain: Unchanged position of a left frontal approach deep brain stimulator which terminates in the left subthalamic region. There is no acute intracranial hemorrhage. No demarcated cortical infarct. No extra-axial fluid collection. No evidence of intracranial mass. No midline shift. Vascular: No hyperdense vessel.  Atherosclerotic calcifications. Skull: Left frontoparietal burr hole at site of DBS lead entry. No calvarial fracture or focal suspicious osseous lesion. Sinuses/Orbits: Visualized orbits show no acute finding. No significant paranasal sinus disease at the imaged levels. CT CERVICAL SPINE FINDINGS Alignment: Straightening of the expected cervical lordosis. 2 mm C4-C5 grade 1 anterolisthesis. Skull base and vertebrae: The basion-dental and atlanto-dental intervals are maintained.No evidence of  acute fracture to the cervical spine. Soft tissues and spinal canal: No prevertebral fluid or swelling. No visible canal hematoma. Disc levels:  Cervical spondylosis. Most notably at C5-C6, there is moderate to moderately advanced disc space narrowing with a disc bulge, endplate spurring and uncovertebral hypertrophy. Bilateral bony neural foraminal narrowing and apparent mild spinal canal stenosis at this level. Upper chest: No consolidation within the imaged lung apices. No visible pneumothorax. Other: A deep brain stimulator lead traverses the left neck. IMPRESSION: CT head: 1. No evidence of acute intracranial abnormality. 2. Unchanged position of a left frontal approach deep brain stimulator lead. CT cervical spine: 1. No evidence of acute fracture to the cervical spine. 2. 2 mm C4-C5 grade 1 anterolisthesis. 3. Cervical spondylosis, greatest at C5-C6. Electronically Signed   By: Kellie Simmering DO   On: 10/02/2020 17:56   CT ABDOMEN PELVIS W CONTRAST  Result Date: 10/02/2020 CLINICAL DATA:  Cholelithiasis, constipation EXAM: CT ABDOMEN AND PELVIS WITH CONTRAST TECHNIQUE: Multidetector CT imaging of the abdomen and pelvis was performed using the standard protocol following bolus administration of intravenous contrast. CONTRAST:  129mL OMNIPAQUE IOHEXOL 300 MG/ML  SOLN COMPARISON:  02/02/2005, 10/02/2020 FINDINGS: Lower chest: Ground-glass airspace disease is seen throughout the visualized right lung base. Bibasilar areas of subpleural scarring are noted. No effusion or pneumothorax. Hepatobiliary: No focal liver abnormality is seen. No gallstones, gallbladder wall thickening, or biliary dilatation. Pancreas: Unremarkable. No pancreatic ductal dilatation or surrounding inflammatory changes. Spleen: Normal in size without focal abnormality. Adrenals/Urinary Tract: There are multiple nonobstructing less than 4 mm calculi within the lower pole right kidney. Additionally, there is a 5 mm partially obstructing  proximal right ureteral calculus, with mild right-sided obstructive uropathy. Normal excretion of contrast on delayed imaging. There are bilateral peripelvic renal cysts. The kidneys enhance normally and symmetrically. The adrenals and bladder are unremarkable. Stomach/Bowel: No bowel obstruction or ileus. Moderate retained stool throughout the colon. The appendix is surgically absent. No bowel wall thickening or inflammatory change. Vascular/Lymphatic: Aortic atherosclerosis. No enlarged abdominal or pelvic lymph nodes. Reproductive: Prostate is unremarkable. Other: No free fluid or free gas.  No abdominal wall hernia. Musculoskeletal: No acute or destructive bony lesions. Unremarkable left hip arthroplasty. Reconstructed images demonstrate no additional findings. IMPRESSION: 1. 5 mm proximal right ureteral calculus, with mild right-sided hydronephrosis. The calculus is nonobstructing, as normal excretion of contrast can be seen traversing the calculus on delayed imaging. 2. Other nonobstructing less than 4 mm right renal calculi, corresponding to the calcifications seen on previous x-ray. 3. Ground-glass airspace disease throughout the right lung base. Findings could reflect infection, inflammation, or aspiration. Asymmetric edema is thought to be unlikely. 4. Aortic Atherosclerosis (ICD10-I70.0) and Emphysema (ICD10-J43.9). Electronically Signed   By: Randa Ngo M.D.   On: 10/02/2020 23:13   US Carotid Bilateral  Result Date: 10/02/2020 CLINICAL DATA:  Syncope, dizziness, weakness for several days EXAM: BILATERAL CAROTID DUPLEX ULTRASOUND TECHNIQUE: Pearline Cables scale imaging, color Doppler and duplex ultrasound were performed of bilateral carotid and vertebral arteries in the neck. COMPARISON:  None. FINDINGS: Criteria: Quantification of carotid stenosis is based on velocity parameters that correlate the residual internal carotid diameter with NASCET-based stenosis levels, using the diameter of the distal  internal carotid lumen as the denominator for stenosis measurement. The following velocity measurements were obtained: RIGHT ICA: 79/12 cm/sec CCA: 326/71 cm/sec SYSTOLIC ICA/CCA RATIO:  0.6 ECA: 182 cm/sec LEFT ICA: 85/17 cm/sec CCA: 245/80 cm/sec SYSTOLIC ICA/CCA RATIO:  0.8 ECA: 154 cm/sec RIGHT VERTEBRAL ARTERY:  Antegrade LEFT VERTEBRAL ARTERY:  Antegrade IMPRESSION: 1. Estimated 0-49% stenosis within the bilateral internal carotid arteries. Electronically Signed  By: Randa Ngo M.D.   On: 10/02/2020 21:04   DG Swallowing Func-Speech Pathology  Result Date: 10/03/2020 Objective Swallowing Evaluation: Type of Study: MBS-Modified Barium Swallow Study  Patient Details Name: DELFORD WINGERT MRN: [id number] Date of Birth: 12-02-1940 Today's Date: 10/03/2020 Time: SLP Start Time (ACUTE ONLY): 1405 -SLP Stop Time (ACUTE ONLY): 1530 SLP Time Calculation (min) (ACUTE ONLY): 85 min Past Medical History: Past Medical History: Diagnosis Date . Arthritis   left hip  . GERD (gastroesophageal reflux disease) Pre 2002  Mild . History of kidney stones  . 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 Parkinson disease ([place]) 2004  Slowly progressive . Ruptured appendix teens Past Surgical History: Past Surgical History: Procedure Laterality Date . APPENDECTOMY   . CATARACT EXTRACTION W/ INTRAOCULAR LENS  IMPLANT, BILATERAL  2017 . DEEP BRAIN STIMULATOR PLACEMENT  8/14  L STN . DOPPLER ECHOCARDIOGRAPHY  01/10/2009  LV NML Mild LVH EF 60-65% aortic sclerosis w/0 stenosis  . JOINT REPLACEMENT    left hip replacement 08/14/11/[facility]  . SUBTHALAMIC STIMULATOR BATTERY REPLACEMENT Left 03/25/2017  Procedure: Deep Brain Stimulator battery replacement;  Surgeon: Erline Levine, MD;  Location: [facility];  Service: Neurosurgery;  Laterality: Left;  left . TONSILLECTOMY   . TOTAL HIP REVISION  08/27/2011  Procedure: TOTAL HIP REVISION;  Surgeon: Mauri Pole, MD;   Location: [facility];  Service: Orthopedics;  Laterality: Left; HPI: Corion Bellamy is a 80 y.o. male with history of Parkinson's disease s/p DBS 2014, dementia, spinal stenosis, myelodysplastic syndrome who presented to ED after being found unresponsive by family. CXR shows left lower lobe PNA. Just prior to SLP arrival for swallow assessment, pt was eating and appeared to aspirate (observed by family) and then lost consciousness. Vital signs stabilized; discussed with pt, family and MD and opted to proceed with MBS for evaluation of swallow function. Clinical swallow evaluation found in chart from 2019; regular/thin was recommended with therapist noting wet vocal quality after multiple or large sips of thin.  Subjective: "It was the steak." Assessment / Plan / Recommendation CHL IP CLINICAL IMPRESSIONS 10/03/2020 Clinical Impression Patient SLP Visit Diagnosis Dysphagia, oropharyngeal phase (R13.12) Attention and concentration deficit following -- Frontal lobe and executive function deficit following -- Impact on safety and function Moderate aspiration risk;Severe aspiration risk;Risk for inadequate nutrition/hydration   CHL IP TREATMENT RECOMMENDATION 10/03/2020 Treatment Recommendations Therapy as outlined in treatment plan below   Prognosis 10/03/2020 Prognosis for Safe Diet Advancement Guarded Barriers to Reach Goals Cognitive deficits;Severity of deficits Barriers/Prognosis Comment -- CHL IP DIET RECOMMENDATION 10/03/2020 SLP Diet Recommendations NPO except meds;Ice chips PRN after oral care Liquid Administration via Cup Medication Administration Crushed with puree Compensations Slow rate;Small sips/bites;Multiple dry swallows after each bite/sip Postural Changes Seated upright at 90 degrees;Remain semi-upright after after feeds/meals (Comment)   CHL IP OTHER RECOMMENDATIONS 10/03/2020 Recommended Consults Other (Comment) Oral Care Recommendations Oral care QID;Oral care before and after PO;Staff/trained caregiver to  provide oral care Other Recommendations Have oral suction available   CHL IP FOLLOW UP RECOMMENDATIONS 10/03/2020 Follow up Recommendations Other (comment)   CHL IP FREQUENCY AND DURATION 10/03/2020 Speech Therapy Frequency (ACUTE ONLY) min 2x/week Treatment Duration 2 weeks      CHL IP ORAL PHASE 10/03/2020 Oral Phase Impaired Oral - Pudding Teaspoon -- Oral - Pudding Cup -- Oral - Honey Teaspoon Weak lingual manipulation;Reduced posterior propulsion;Lingual/palatal residue Oral - Honey Cup Weak lingual manipulation;Reduced posterior propulsion;Lingual/palatal residue Oral -  Nectar Teaspoon Weak lingual manipulation;Reduced posterior propulsion;Lingual/palatal residue Oral - Nectar Cup -- Oral - Nectar Straw -- Oral - Thin Teaspoon Weak lingual manipulation;Reduced posterior propulsion;Lingual/palatal residue Oral - Thin Cup -- Oral - Thin Straw -- Oral - Puree Weak lingual manipulation;Reduced posterior propulsion;Lingual/palatal residue Oral - Mech Soft -- Oral - Regular -- Oral - Multi-Consistency -- Oral - Pill -- Oral Phase - Comment --  CHL IP PHARYNGEAL PHASE 10/03/2020 Pharyngeal Phase Impaired Pharyngeal- Pudding Teaspoon -- Pharyngeal -- Pharyngeal- Pudding Cup -- Pharyngeal -- Pharyngeal- Honey Teaspoon Delayed swallow initiation-pyriform sinuses;Reduced epiglottic inversion;Reduced anterior laryngeal mobility;Reduced tongue base retraction;Penetration/Apiration after swallow;Pharyngeal residue - valleculae;Pharyngeal residue - pyriform;Pharyngeal residue - cp segment Pharyngeal Material does not enter airway;Material enters airway, passes BELOW cords without attempt by patient to eject out (silent aspiration) Pharyngeal- Honey Cup Delayed swallow initiation-pyriform sinuses;Reduced epiglottic inversion;Reduced anterior laryngeal mobility;Reduced laryngeal elevation;Reduced tongue base retraction;Penetration/Apiration after swallow;Pharyngeal residue - valleculae;Pharyngeal residue - pyriform;Pharyngeal  residue - cp segment Pharyngeal Material enters airway, passes BELOW cords without attempt by patient to eject out (silent aspiration) Pharyngeal- Nectar Teaspoon Delayed swallow initiation-pyriform sinuses;Reduced epiglottic inversion;Reduced anterior laryngeal mobility;Reduced laryngeal elevation;Reduced tongue base retraction;Penetration/Apiration after swallow;Moderate aspiration;Pharyngeal residue - valleculae;Pharyngeal residue - pyriform;Pharyngeal residue - cp segment Pharyngeal Material enters airway, passes BELOW cords without attempt by patient to eject out (silent aspiration) Pharyngeal- Nectar Cup -- Pharyngeal -- Pharyngeal- Nectar Straw -- Pharyngeal -- Pharyngeal- Thin Teaspoon Delayed swallow initiation-pyriform sinuses;Reduced epiglottic inversion;Reduced anterior laryngeal mobility;Reduced laryngeal elevation;Reduced tongue base retraction;Penetration/Apiration after swallow;Pharyngeal residue - valleculae;Pharyngeal residue - pyriform;Pharyngeal residue - cp segment;Moderate aspiration Pharyngeal Material does not enter airway;Material enters airway, passes BELOW cords without attempt by patient to eject out (silent aspiration) Pharyngeal- Thin Cup -- Pharyngeal -- Pharyngeal- Thin Straw -- Pharyngeal -- Pharyngeal- Puree Delayed swallow initiation-vallecula;Reduced epiglottic inversion;Reduced laryngeal elevation;Reduced anterior laryngeal mobility;Reduced tongue base retraction;Pharyngeal residue - valleculae;Pharyngeal residue - pyriform;Pharyngeal residue - cp segment Pharyngeal Material does not enter airway Pharyngeal- Mechanical Soft -- Pharyngeal -- Pharyngeal- Regular -- Pharyngeal -- Pharyngeal- Multi-consistency -- Pharyngeal -- Pharyngeal- Pill -- Pharyngeal -- Pharyngeal Comment --  CHL IP CERVICAL ESOPHAGEAL PHASE 10/03/2020 Cervical Esophageal Phase Impaired Pudding Teaspoon -- Pudding Cup -- Honey Teaspoon -- Honey Cup -- Nectar Teaspoon -- Nectar Cup -- Nectar Straw -- Thin  Teaspoon -- Thin Cup -- Thin Straw -- Puree -- Mechanical Soft -- Regular -- Multi-consistency -- Pill -- Cervical Esophageal Comment (No Data) Deneise Lever, MS, CCC-SLP Speech-Language Pathologist Aliene Altes 10/03/2020, 4:40 PM                   Scheduled Meds: . azithromycin  500 mg Oral Daily  . carbidopa-levodopa  1 tablet Oral 5 X Daily   And  . carbidopa-levodopa  1 tablet Oral Daily  . cefdinir  300 mg Oral Q12H  . gabapentin  100-200 mg Oral TID   Continuous Infusions: . lactated ringers 100 mL/hr at 10/03/20 1552     LOS: 1 day    Time spent: 45 minutes    Edwin Dada, MD [facility] 10/04/2020, 3:53 PM     Please page though [place] or Epic secure chat:  For [employer], Adult nurse

## 2020-10-04 NOTE — Progress Notes (Signed)
Pt is increasingly irritable regarding discharge plan and inisists he is "going to leave." Pt is making multiple impulsive attempts at getting up alone, triggering alarms. Pt is ambulated around unit to help relieve stress and promote pt mobility. Pt re educated on importance of not getting up alone, however, does not agree to follow such requests. MD aware, chair alarm, fall matts, call bell in place beside pt, PPPP addressed. Will continue to monitor.

## 2020-10-04 NOTE — Consult Note (Signed)
Consultation Note Date: 10/04/2020   Patient Name: Casey Tucker  DOB: [birth year]  MRN: [id number]  Age / Sex: 80 y.o., male  PCP: [name], MD Referring Physician: Edwin Dada, *  Reason for Consultation: Establishing goals of care and Psychosocial/spiritual support  HPI/Patient Profile: 80 y.o. male  with past medical history of Parkinson sp  left STN DBS at [facility] in August, 2014., Myelodysplastic syndrome, GERD admitted on 10/02/2020 with LLL PNE, parkenson's with dementia.   Clinical Assessment and Goals of Care: I have reviewed medical records including EPIC notes, labs and imaging, received report from bedside nursing staff, examined the patient and met at bedside with the patient to discuss diagnosis prognosis, GOC, EOL wishes, disposition and options.    I introduced Palliative Medicine as specialized medical care for people living with serious illness. It focuses on providing relief from the symptoms and stress of a serious illness.   Casey Tucker is sitting up in the [place] chair in his room.  He is tapping the arm of his chair repeatedly.  He is oriented to person, place, situation.  He is able to make his basic needs known.  There is no family at bedside at this time.  He does have periods of confusion, and is difficult to understand at times.  We discussed a brief life review of the patient.  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  He has been married to his wife K 33 years.  They have 2 adult daughters, Casey Tucker and Casey Tucker.  We talked about the treatment plan including his medication regimen, his episode yesterday.  He seems to have limited understanding.  I return later in the morning to find Casey Tucker more clear in his thoughts, although he remains confused about discharge. He tells me that he wants to see hospitalist, Dr. Loleta Books.   Advanced directives, concepts specific to code status,   and rehospitalization were considered and discussed.  We talked about "treat the treatable" care.  Casey Tucker states that at this point he would want attempted resuscitation and life support for at least a few days.  He tells me that he would rehospitalize as needed.  Hospice and Palliative Care services outpatient were explained and offered.  Casey Tucker is active with a [facility] outpatient palliative services.  Questions and concerns were addressed.  The family was encouraged to call with questions or concerns.   Conference with attending, bedside nursing staff, transition of care team related to patient condition, needs, goals of care.  HCPOA    NEXT OF KIN -Casey Tucker names his wife, Casey Tucker, and 2 adult daughters, Casey Tucker and Casey Tucker, as his healthcare surrogates.    SUMMARY OF RECOMMENDATIONS   Continue full scope/full code. Would accept life support for 2 or 3 days. Unsure of qualifies for PT at this time   Code Status/Advance Care Planning:  Full code  Symptom Management:   Per hospitalist, no additional needs at this time.  Palliative Prophylaxis:   Oral Care and Turn Reposition  Additional Recommendations (Limitations, Scope, Preferences):  Full Scope Treatment  Psycho-social/Spiritual:   Desire for further Chaplaincy support:no  Additional Recommendations: Caregiving  Support/Resources and Education on Hospice  Prognosis:   Unable to determine, 6 months or more would not be surprising.  Discharge Planning: To be determined, based on outcomes.        Primary Diagnoses: Present on Admission: . Syncope and collapse . Vascular dementia without behavioral disturbance ([place]) . Parkinson's disease ([place]) . MDS (myelodysplastic syndrome) ([place]) . Anemia . Leukocytosis . Ureteral stone with hydronephrosis . Left lower lobe pneumonia   I have reviewed the medical record, interviewed the patient and family, and examined the patient. The following aspects are  pertinent.  Past Medical History:  Diagnosis Date  . Arthritis    left hip   . GERD (gastroesophageal reflux disease) Pre 2002   Mild  . History of kidney stones   . 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 Parkinson disease ([place]) 2004   Slowly progressive  . Ruptured appendix teens   Social History   Socioeconomic History  . Marital status: Married    Spouse name: Not on file  . Number of children: 2  . Years of education: Not on file  . Highest education level: Doctorate  Occupational History  . Occupation: Optometrist    Comment: medically retired  Tobacco Use  . Smoking status: Never Smoker  . Smokeless tobacco: Never Used  Vaping Use  . Vaping Use: Never used  Substance and Sexual Activity  . Alcohol use: Yes    Alcohol/week: 7.0 standard drinks    Types: 7 Standard drinks or equivalent per week    Comment: occassionally  . Drug use: No  . Sexual activity: Yes  Other Topics Concern  . Not on file  Social History Narrative   Married, lives with wife   2 daughters   Right handed   Has living will   Wife has health care POA---then daughters   Would still accept CPR--but no prolonged artificial means (ventilator or tube feeds)   Social Determinants of Health   Financial Resource Strain: Not on file  Food Insecurity: Not on file  Transportation Needs: Not on file  Physical Activity: Not on file  Stress: Not on file  Social Connections: Not on file   Family History  Problem Relation Age of Onset  . Arthritis Mother        knee replacement  . COPD Father        emphysema, smoker  . Heart disease Father        CHF  . Healthy Sister   . Alcohol abuse Paternal Uncle   . Rheum arthritis Daughter    Scheduled Meds: . azithromycin  500 mg Oral Daily  . carbidopa-levodopa  1 tablet Oral 5 X Daily   And  . carbidopa-levodopa  1 tablet Oral Daily  . cefdinir  300 mg Oral Q12H   . gabapentin  100-200 mg Oral TID   Continuous Infusions: . lactated ringers 100 mL/hr at 10/03/20 1552   PRN Meds:.acetaminophen **OR** acetaminophen, HYDROcodone-acetaminophen, menthol-cetylpyridinium Medications Prior to Admission:  Prior to Admission medications   Medication Sig Start Date End Date Taking? Authorizing Provider  carbidopa-levodopa (SINEMET IR) 25-100 MG tablet TAKE 2 AT 7:00, 1 AT 10AM,1 AT 1PM, 1 AT 4PM AND 1 AT 7PM 08/02/20  Yes Tat, Eustace Quail, DO  gabapentin (NEURONTIN) 100 MG capsule TAKE  ONE CAPSULE THREE TIMES A DAY AND TWO CAPSULES AT BEDTIME. 08/03/20  Yes [name], MD   Allergies  Allergen Reactions  . Amoxicillin   . Celebrex [Celecoxib]   . Demerol Other (See Comments)    Hallucinations   . Penicillins     REACTION: RASH   Review of Systems  Unable to perform ROS: Acuity of condition    Physical Exam Vitals and nursing note reviewed.  Cardiovascular:     Rate and Rhythm: Normal rate.  Pulmonary:     Effort: Pulmonary effort is normal. No respiratory distress.  Musculoskeletal:        General: No swelling.  Skin:    General: Skin is warm and dry.     Findings: Bruising present.  Neurological:     Mental Status: He is alert and oriented to person, place, and time.     Vital Signs: BP (!) 167/67   Pulse 79   Temp 98.4 F (36.9 C) (Oral)   Resp 18   Ht 6' (1.829 m)   Wt 68.5 kg   SpO2 94%   BMI 20.48 kg/m  Pain Scale: 0-10   Pain Score: 6    SpO2: SpO2: 94 % O2 Device:SpO2: 94 % O2 Flow Rate: .   IO: Intake/output summary:   Intake/Output Summary (Last 24 hours) at 10/04/2020 1518 Last data filed at 10/04/2020 0600 Gross per 24 hour  Intake 1999.32 ml  Output 820 ml  Net 1179.32 ml    LBM:   Baseline Weight: Weight: 68.5 kg Most recent weight: Weight: 68.5 kg     Palliative Assessment/Data:   Flowsheet Rows   Flowsheet Row Most Recent Value  Intake Tab   Referral Department Hospitalist  Unit at Time of  Referral Med/Surg Unit  Palliative Care Primary Diagnosis Other (Comment)  Date Notified 10/03/20  Palliative Care Type New Palliative care  Reason for referral Clarify Goals of Care  Date of Admission 10/02/20  Date first seen by Palliative Care 10/04/20  # of days Palliative referral response time 1 Day(s)  # of days IP prior to Palliative referral 1  Clinical Assessment   Palliative Performance Scale Score 50%  Pain Max last 24 hours Not able to report  Pain Min Last 24 hours Not able to report  Dyspnea Min Last 24 hours Not able to report  Psychosocial & Spiritual Assessment   Palliative Care Outcomes       Time In: 0930 Time Out: 1040 Time Total: 70 minutes  Greater than 50%  of this time was spent counseling and coordinating care related to the above assessment and plan.  Signed by: Drue Novel, NP   Please contact Palliative Medicine Team phone at [phone] for questions and concerns.  For individual provider: See Shea Evans

## 2020-10-04 NOTE — TOC Progression Note (Signed)
Transition of Care [facility]) - Progression Note    Patient Details  Name: Casey Tucker MRN: [id number] Date of Birth: 21-Mar-1941  Transition of Care [facility] CM/SW Contact  Su Hilt, RN Phone Number: 10/04/2020, 3:37 PM  Clinical Narrative:    Talked with the daughter Casey Tucker and she is strongly wanting the patient to go to Rehab.  Went into the room and spoke with the patient, He has had Home health in the home previously 5 days a week and is agreeable to do that again.  He does agree to go to [facility] for a couple of weeks to get stronger if they have a bed,  CM called and spoke to [place] at [facility] and she will have a bed late tomorrow but would like Monday if possible, The patient has had Covid Booster and his daughter will fax the proof of vaccine Information sent thru the Hub to [place]        Expected Discharge Plan and Services                                                 Social Determinants of Health (SDOH) Interventions    Readmission Risk Interventions No flowsheet data found.

## 2020-10-04 NOTE — Progress Notes (Signed)
Physical Therapy Treatment Patient Details Name: Casey Tucker MRN: [id number] DOB: 04-May-1941 Today's Date: 10/04/2020    History of Present Illness Casey Tucker is a 59yoM who comes to [facility] on 10/02/20 after found unresponsive by wife at home. Pt admissted for syncope, leukocytosis, and anemia. Imaging revealing of LLL PNA. PMH: Chronic PD c Left STN DBS at [facility] 2014, myelodysplastic syndrome, vascular dementia, GERD, spinal stenosis. Pt seen by our services 8 months ago during admission for syncope.    PT Comments    Author called back to room to reassess patient's mobility, mentation, safety awareness- reportedly pt went from sitting EOB to tall kneeling on floor to quadruped. Pt fervently denies "falling" reports he was just trying to catch attention of RN/MD and lost balance then safely lowered self into quadruped. Pt has strikingly worse dysarthria at present, now author unable to understand more than a handful of words, much heavier than 2 hours prior. BP assessed, 160s/80s. Pt requires MinA up from EOB, weaker than 2 hours earlier. Pt able to AMB nearly equal in speed, improve RW positioning (closer to habitus), mildly worse freezing, festination instances, but easily overcome with verbal cues and RW manual facilitation. AMB distance limited this session as pt stops at ~48ft, becomes more chatty, unfortuately slurred and not understood, hence author elects to return to room as it is unclear that pt is trying to relay fatigue, pain, or presyncope. No clear difference in hypophonia, delayed initiation of speech, tremor briefly present in single hand albeit not noticed before. Mentation seems slightly worse but difficult to be determined due to dysarthria. Pt remains mildly impulsive and not very accepting of his own safety limitations, both of which remain he biggest factors for falls risk at present.     Follow Up Recommendations  SNF;Supervision for mobility/OOB;Supervision/Assistance  - 24 hour     Equipment Recommendations  None recommended by PT    Recommendations for Other Services       Precautions / Restrictions Precautions Precautions: Fall Precaution Comments: impulsive Restrictions Weight Bearing Restrictions: No    Mobility  Bed Mobility               General bed mobility comments: seated EOB upon arrival    Transfers Overall transfer level: Needs assistance Equipment used: Rolling walker (2 wheeled) Transfers: Sit to/from Stand Sit to Stand: Min assist         General transfer comment: requires minA this time, much weaker than just a couple hours prior  Ambulation/Gait Ambulation/Gait assistance: Min guard;Min assist Gait Distance (Feet): 160 Feet Assistive device: Rolling walker (2 wheeled) Gait Pattern/deviations: Trunk flexed Gait velocity: distance limited this time for safety due to heavy dysphagia, pt cannot safely report any concerns or symptoms during AMB.   General Gait Details: freezing and festinaton ~20% worse than earlier, authro faciliates continuous RW progression as needed with is helpful for continuous AMB; pt does better to keep RW closer to self (now apporpriately safe distance) without any cues this time.   Stairs             Wheelchair Mobility    Modified Rankin (Stroke Patients Only)       Balance Overall balance assessment: History of Falls;Mild deficits observed, not formally tested (impulsive and not especialyl safe RW use, also multiple risk factors for orthostasis.)  Cognition Arousal/Alertness: Awake/alert Behavior During Therapy: Flat affect;Impulsive Overall Cognitive Status: Difficult to assess                                 General Comments: more confused than just hours before, language now with heavy slurring, difficult to understand more than just a handful of words.      Exercises      General  Comments        Pertinent Vitals/Pain Pain Assessment: Faces Faces Pain Scale: Hurts little more Pain Location: knees hurt all the time    Home Living                      Prior Function            PT Goals (current goals can now be found in the care plan section) Acute Rehab PT Goals Patient Stated Goal: go home PT Goal Formulation: With patient Time For Goal Achievement: 10/18/20 Potential to Achieve Goals: Fair    Frequency    Min 2X/week      PT Plan      Co-evaluation              AM-PAC PT "6 Clicks" Mobility   Outcome Measure  Help needed turning from your back to your side while in a flat bed without using bedrails?: A Little Help needed moving from lying on your back to sitting on the side of a flat bed without using bedrails?: A Little Help needed moving to and from a bed to a chair (including a wheelchair)?: A Lot Help needed standing up from a chair using your arms (e.g., wheelchair or bedside chair)?: A Lot Help needed to walk in hospital room?: A Lot Help needed climbing 3-5 steps with a railing? : A Lot 6 Click Score: 14    End of Session Equipment Utilized During Treatment: Gait belt Activity Tolerance: Patient tolerated treatment well;No increased pain Patient left: in chair;with call bell/phone within reach;with chair alarm set Nurse Communication: Mobility status PT Visit Diagnosis: Difficulty in walking, not elsewhere classified (R26.2);Other abnormalities of gait and mobility (R26.89);Repeated falls (R29.6);Other symptoms and signs involving the nervous system (R29.898);Unsteadiness on feet (R26.81)     Time: 5456-2563 PT Time Calculation (min) (ACUTE ONLY): 17 min  Charges:  $Therapeutic Exercise: 8-22 mins $Neuromuscular Re-education: 8-22 mins                     2:02 PM, 10/04/20 Etta Grandchild, PT, DPT Physical Therapist - [facility]  [phone] ([place])    [place]  C 10/04/2020, 1:53 PM

## 2020-10-04 NOTE — Telephone Encounter (Signed)
-----   Message from Hollice Espy, MD sent at 10/03/2020  1:21 PM EDT ----- Regarding: f/u This patient would like to see Dr. Bernardo Heater in 4 weeks with [facility].  He has an old friend of his.  He is currently admitted.  Hollice Espy, MD

## 2020-10-04 NOTE — Progress Notes (Signed)
Initial Nutrition Assessment  DOCUMENTATION CODES:   Severe malnutrition in context of chronic illness  INTERVENTION:   Ensure Enlive po TID, each supplement provides 350 kcal and 20 grams of protein  Magic cup TID with meals, each supplement provides 290 kcal and 9 grams of protein  MVI po daily   Pt at high refeed risk; recommend monitor potassium, magnesium and phosphorus labs daily until stable  NUTRITION DIAGNOSIS:   Severe Malnutrition related to chronic illness (parkinson's dementia, MDS) as evidenced by severe fat depletion,severe muscle depletion.  GOAL:   Patient will meet greater than or equal to 90% of their needs  MONITOR:   Diet advancement,Labs,Weight trends,Skin,I & O's  REASON FOR ASSESSMENT:   Consult Assessment of nutrition requirement/status  ASSESSMENT:   80 y.o. male with h/o Parkinson's s/p deep brain stimulator 2014, dementia, spinal stenosis with chronic pain and MDS who is admitted with PNA and concerns for aspiration   Pt s/p MBSS 3/17  Met with pt in room today. Pt sitting up in chair, complaining of leg aches at time of RD visit. Pt reports fair appetite and oral intake at baseline. Apparently, pt had a choking episode, was seen by SLP who recommended NPO secondary to high aspiration risk. MD initiated on dysphagia 2/thin liquid diet today. Pt reports that he does drink chocolate Boost at home. RD will add supplements and MVI to help pt meet his estimated needs. Palliative care following for GOC. Would recommend discussions about pt's nutritional status and how this relates to his progressive illness as patient/family will likely face decisions about PEG tube in the near future.    Per chart, pt has remained weight stable for the past year  Medications reviewed and include: azithromycin, cefdinir, LRS _0 /hr  Labs reviewed: K 3.9 wnl, creat 0.60(L) P 3.1 wnl, Mg 2.1 wnl- 3/17 Hgb 9.0(L), Hct 26.1(L), MCV 108.8(H), MCH 37.5(H)  NUTRITION  - FOCUSED PHYSICAL EXAM:  Flowsheet Row Most Recent Value  Orbital Region Moderate depletion  Upper Arm Region Severe depletion  Thoracic and Lumbar Region Severe depletion  Buccal Region Severe depletion  Temple Region Severe depletion  Clavicle Bone Region Severe depletion  Clavicle and Acromion Bone Region Severe depletion  Scapular Bone Region Severe depletion  Dorsal Hand Severe depletion  Patellar Region Severe depletion  Anterior Thigh Region Severe depletion  Posterior Calf Region Severe depletion  Edema (RD Assessment) None  Hair Reviewed  Eyes Reviewed  Mouth Reviewed  Skin Reviewed  Nails Reviewed     Diet Order:   Diet Order            Diet NPO time specified Except for: Ice Chips, Sips with Meds  Diet effective now                EDUCATION NEEDS:   Education needs have been addressed  Skin:  Skin Assessment: Reviewed RN Assessment  Last BM:  pta  Height:   Ht Readings from Last 1 Encounters:  10/03/20 6' (1.829 m)    Weight:   Wt Readings from Last 1 Encounters:  10/03/20 68.5 kg    Ideal Body Weight:  80.9 kg  BMI:  Body mass index is 20.48 kg/m.  Estimated Nutritional Needs:   Kcal:  2000-2300kcal/day  Protein:  100-115g/day  Fluid:  1.8-2.1L/day  Koleen Distance MS, RD, LDN Please refer to [facility] for RD and/or RD on-call/weekend/after hours pager

## 2020-10-04 NOTE — Progress Notes (Addendum)
SLP Cancellation Note  Patient Details Name: Casey Tucker MRN: [id number] DOB: Nov 20, 1940   Cancelled treatment:       Reason Eval/Treat Not Completed:  (chart reviewed; consulted w/ MD). MD consulted this SLP re: pt's swallowing status. Discussed pt's MBSS results(see MBSS on 10/03/2020 -- Education given to pt/family on the results as well as explanation that issues are related to his Parkinson's Disease and not expected to improve especially in light of his Cognitive decline from Dementia) and pt's risk for aspiration w/ ALL oral intake despite the consistency of food or liquid, or precaution. Discussed options of diet consistency noting assessing SLP's suggestion for puree/thin liquids IF pt and family are choosing a diet for comfort and quality of life; MD agreed w/ maintaining quality of life w/ a diet of purees and minced foods possibly. MD will speak w/ family and order the Dysphagia diet w/ thin liquids; further reiturate the risk for aspiration of any/all consistencies of po's as per the MBSS results. Dietician ordering supplements - per pt's request, he likes Chocolate Ensure. Noted Palliative Care consult w/ pt/family today; pt remains a Full Code per chart. Noted Discharge to SNF is being arranged by CM per chart notes.  ST services can be available for further education while pt is admitted. MD agreed.      Orinda Kenner, MS, CCC-SLP Speech Language Pathologist Rehab Services [phone] [facility] 10/04/2020, 3:58 PM

## 2020-10-04 NOTE — Plan of Care (Signed)
End of Shift Summary:  Alert and oriented x4. VSS. Remained on room air, sats >94%. Received 1 unit of PRBC. Tolerated well. UOP adequate. (-) BM. Dressing to right knee changed. IV abx per MAR. Pain managed per prn medications. Denies n/v. Remained free from falls or injury. Call bell within reach and able to use.   Problem: Education: Goal: Knowledge of General Education information will improve Description: Including pain rating scale, medication(s)/side effects and non-pharmacologic comfort measures Outcome: Progressing   Problem: Health Behavior/Discharge Planning: Goal: Ability to manage health-related needs will improve Outcome: Progressing   Problem: Clinical Measurements: Goal: Ability to maintain clinical measurements within normal limits will improve Outcome: Progressing Goal: Will remain free from infection Outcome: Progressing Goal: Diagnostic test results will improve Outcome: Progressing Goal: Respiratory complications will improve Outcome: Progressing Goal: Cardiovascular complication will be avoided Outcome: Progressing   Problem: Elimination: Goal: Will not experience complications related to bowel motility Outcome: Progressing Goal: Will not experience complications related to urinary retention Outcome: Progressing   Problem: Pain Managment: Goal: General experience of comfort will improve Outcome: Progressing   Problem: Safety: Goal: Ability to remain free from injury will improve Outcome: Progressing   Problem: Skin Integrity: Goal: Risk for impaired skin integrity will decrease Outcome: Progressing

## 2020-10-04 NOTE — Telephone Encounter (Signed)
appt scheduled   [name]

## 2020-10-04 NOTE — Evaluation (Signed)
Physical Therapy Evaluation Patient Details Name: Casey Tucker MRN: [id number] DOB: 1941-05-22 Today's Date: 10/04/2020   History of Present Illness  Kennard "Gershon Mussel" [place] is a 35yoM who comes to [facility] on 10/02/20 after found unresponsive by wife at home. Pt admissted for syncope, leukocytosis, and anemia. Imaging revealing of LLL PNA. PMH: Chronic PD c Left ST[facility] DBS at [facility] 2014, myelodysplastic syndrome, vascular dementia, GERD, spinal stenosis. Pt seen by our services 8 months ago during admission for syncope.  Clinical Impression  Pt admitted with above diagnosis. Pt currently with functional limitations due to the deficits listed below (see "PT Problem List"). Upon entry, pt in recliner, awake and agreeable to participate. The pt is alert, pleasant, interactive, and able to provide some basic info regarding prior level of function. Pt has flat affect, mild slurring of words, but easily understood 90% of time. Pt mildly impulsive at times, but follows safety cues to gait prior to mobilizing. Heavy effort to rise to standing with RW from recliner, fluency with hand placement, no LOB upon stance. Pt AMB out of room and around unit and back, intermittent freezing and festination often overcome with cues for 'bigger steps.' Pt maintains RW too far anterior for optimal utilization during LOB-pt educated on this later in session. Pt left up in chair at end of session. He is not agreeable to [place] as he does not like people in his home, but he is open to considering our OPPT neuro clinic downstairs. PTA pt was AMB household distances without supervision, albeit with falls history. Today, he is not able to AMB with such independence, hence he has good prognosis for return to baseline independence- however he will need additional physical assistance at home, unclear if this can be met. PLOF will need to be confirmed with family. Patient's performance this date reveals decreased ability, independence, and  tolerance in performing all basic mobility required for performance of activities of daily living. Pt requires additional DME, close physical assistance, and cues for safe participate in mobility. Pt will benefit from skilled PT intervention to increase independence and safety with basic mobility in preparation for discharge to the venue listed below.       Follow Up Recommendations SNF;Supervision for mobility/OOB;Supervision/Assistance - 24 hour    Equipment Recommendations  None recommended by PT    Recommendations for Other Services       Precautions / Restrictions Precautions Precautions: Fall Precaution Comments: impulsive Restrictions Weight Bearing Restrictions: No      Mobility  Bed Mobility Overal bed mobility: Needs Assistance Bed Mobility: Supine to Sit;Sit to Supine     Supine to sit: Min assist;HOB elevated Sit to supine: Supervision   General bed mobility comments: in chair upon arrival    Transfers Overall transfer level: Needs assistance Equipment used: Rolling walker (2 wheeled) Transfers: Sit to/from Stand Sit to Stand: Min guard         General transfer comment: from recliner, safe use of RW;  Ambulation/Gait Ambulation/Gait assistance: Min guard Gait Distance (Feet): 260 Feet Assistive device: Rolling walker (2 wheeled) Gait Pattern/deviations: Trunk flexed     General Gait Details: intermittent freezing, festination, mostly with transitional point of gait, but sustains well over straightaways. Maintains RW too far out front, later educated on safety positioning.  Stairs            Wheelchair Mobility    Modified Rankin (Stroke Patients Only)       Balance Overall balance assessment: History of Falls;Mild deficits  observed, not formally tested (impulsive and not especialyl safe RW use, also multiple risk factors for orthostasis.) Sitting-balance support: Single extremity supported;Feet supported Sitting balance-Leahy Scale:  Good     Standing balance support: Bilateral upper extremity supported;During functional activity Standing balance-Leahy Scale: Poor Standing balance comment: BUE reliance on RW                             Pertinent Vitals/Pain Pain Assessment: Faces Faces Pain Scale: Hurts little more Pain Location: knees hurt all the time    Home Living Family/patient expects to be discharged to:: Private residence Living Arrangements: Spouse/significant other Available Help at Discharge: Family;Friend(s);Available PRN/intermittently;Available 24 hours/day (pt reports wife there "most of the time", 2 daughters around, and friend Elta [place] available PRN) Type of Home: House Home Access: Stairs to enter Entrance Stairs-Rails: Left Entrance Stairs-Number of Steps: 3 Home Layout: One level;Other (Comment) (3 steps to enter bedroom w/ rails) Home Equipment: Walker - 2 wheels;Walker - 4 wheels;Shower seat - built in;Grab bars - tub/shower;Grab bars - toilet      Prior Function Level of Independence: Needs assistance   Gait / Transfers Assistance Needed: pt reports ambulating with 2WW, endorses 3+ falls (falls anteriorly)  ADL's / Homemaking Assistance Needed: Pt reports generally indep with ADL, sometimes needs assist for LB dressing on L (hip sx ~1yrago per pt report), pt and wife do medication mgt together; pt reports hired assist for meal prep and cleaning 5x/wk; and wife drives        Hand Dominance   Dominant Hand: Right    Extremity/Trunk Assessment   Upper Extremity Assessment Upper Extremity Assessment: Overall WFL for tasks assessed (very mild occasional jerking but not overly functionally limiting)    Lower Extremity Assessment Lower Extremity Assessment: Defer to PT evaluation;Generalized weakness (grossly at least 4/5 bilat, R knee wound bandaged)    Cervical / Trunk Assessment Cervical / Trunk Assessment:  (head slightly forward, hunches)  Communication    Communication: No difficulties  Cognition Arousal/Alertness: Awake/alert Behavior During Therapy: WFL for tasks assessed/performed;Impulsive Overall Cognitive Status: No family/caregiver present to determine baseline cognitive functioning                                 General Comments: grossly WFL, oriented to self, place, year, month with cues, and states he's here "because they think I'm dying", follows commands, mildly impulsive with standing      General Comments      Exercises Other Exercises Other Exercises: Pt instructed in ADL transfers with AD to improve safety   Assessment/Plan    PT Assessment Patient needs continued PT services  PT Problem List Decreased cognition;Decreased range of motion;Decreased activity tolerance;Decreased balance;Decreased mobility;Decreased knowledge of precautions;Decreased safety awareness;Decreased knowledge of use of DME       PT Treatment Interventions DME instruction;Gait training;Balance training;Stair training;Functional mobility training;Therapeutic activities;Therapeutic exercise;Patient/family education;Cognitive remediation;Neuromuscular re-education    PT Goals (Current goals can be found in the Care Plan section)  Acute Rehab PT Goals Patient Stated Goal: go home PT Goal Formulation: With patient Time For Goal Achievement: 10/18/20 Potential to Achieve Goals: Fair    Frequency Min 2X/week   Barriers to discharge Decreased caregiver support Per chart wife is unable to provide adequate safety for mobility    Co-evaluation               AM-PAC  PT "6 Clicks" Mobility  Outcome Measure Help needed turning from your back to your side while in a flat bed without using bedrails?: A Little Help needed moving from lying on your back to sitting on the side of a flat bed without using bedrails?: A Little Help needed moving to and from a bed to a chair (including a wheelchair)?: A Lot Help needed standing up from a  chair using your arms (e.g., wheelchair or bedside chair)?: A Lot Help needed to walk in hospital room?: A Lot Help needed climbing 3-5 steps with a railing? : A Lot 6 Click Score: 14    End of Session Equipment Utilized During Treatment: Gait belt Activity Tolerance: Patient tolerated treatment well;No increased pain Patient left: in chair;with call bell/phone within reach;with chair alarm set Nurse Communication: Mobility status PT Visit Diagnosis: Difficulty in walking, not elsewhere classified (R26.2);Other abnormalities of gait and mobility (R26.89);Repeated falls (R29.6);Other symptoms and signs involving the nervous system (R29.898);Unsteadiness on feet (R26.81)    Time: 3539-1225 PT Time Calculation (min) (ACUTE ONLY): 23 min   Charges:   PT Evaluation $PT Eval High Complexity: 1 High PT Treatments $Therapeutic Exercise: 8-22 mins        1:46 PM, 10/04/20 Etta Grandchild, PT, DPT Physical Therapist - [facility]  [phone] ([place])   [place] C 10/04/2020, 1:35 PM

## 2020-10-04 NOTE — Progress Notes (Signed)
Pt supervised during dinner. Pt able to eat independently with coaching to take smaller bites and go more slowly. Pt able to clear throat successfully but is resistant to teaching. Pt given pain meds for chronic leg pain. Moved to room closer to nurses station, bed alarm on. PPPP addressed.

## 2020-10-04 NOTE — Evaluation (Addendum)
Occupational Therapy Evaluation Patient Details Name: Casey Tucker MRN: [id number] DOB: [birth year] Today's Date: 10/04/2020    History of Present Illness 80 y.o. male with medical history significant of Parkinsons, vascular dementia, sp left STN DBS at [facility] in August, 2014., Myelodysplastic syndrome, GERD. Presented with unresponsive episode found by his wife with his caregiver was unresponsive EMS but then became more alert. Family states he is not drinking well, trys to restrict himself due to limited mobility. Patient did not endorse any associated chest pain or shortness of breath no fevers or chills. Neurologically appeared to be intact. At baseline walks with a walker. Patient apparently had a recent fall hitting his right knee. Family did not notice any black stools.   Clinical Impression   Pt was seen for OT evaluation this date. Prior to hospital admission, pt was ambulating with a 2WW, performing most basic ADL without assist (PRN for LB dressing for LLE 2/2 old hip sx per pt report), managing medications together with his spouse, and had hired assist for meal prep and housekeeping tasks 5x/wk. Pt lives with his spouse in a 1 story home with 3 steps to enter, 3 steps down to bedroom w/ rails. Pt endorses 3+ falls in past 3 months, falling anteriorly. Pt denies history of lightheadedness with positional changes. Orthostatic vitals taken during ADL mobility (see Vitals for additional detail, BP soft but not orthostatic, RN notified). Currently pt demonstrates impairments as described below (See OT problem list) which functionally limit his ability to perform ADL/self-care tasks. Pt currently requires MIN A for sup>sit EOB, MIN-MOD A for LB ADL, MIN A for ADL transfers, and PRN VC for safety 2/2 decreased BLE strength, balance, activity tolerance, FMC/GMC (intermittent mild jerking, worse at start of session), and safety awareness. Pt reliant on RW for BUE support. Pt would benefit from skilled  OT services to address noted impairments and functional limitations (see below for any additional details) in order to maximize safety and independence while minimizing falls risk and caregiver burden. Upon hospital discharge, recommend STR to maximize pt safety and return to PLOF.      Follow Up Recommendations  SNF; Supervision/Assistance - 24 hour    Equipment Recommendations  3 in 1 bedside commode    Recommendations for Other Services       Precautions / Restrictions Precautions Precautions: Fall Restrictions Weight Bearing Restrictions: No      Mobility Bed Mobility Overal bed mobility: Needs Assistance Bed Mobility: Supine to Sit;Sit to Supine     Supine to sit: Min assist;HOB elevated Sit to supine: Supervision   General bed mobility comments: increased time, effort, mild shaking/jerking movements with initial sup>sit    Transfers Overall transfer level: Needs assistance Equipment used: Rolling walker (2 wheeled) Transfers: Sit to/from Stand Sit to Stand: From elevated surface;Min assist;Min guard         General transfer comment: cues to scoot forward prior to attempt with improved success    Balance Overall balance assessment: Needs assistance;History of Falls Sitting-balance support: Single extremity supported;Feet supported Sitting balance-Leahy Scale: Good     Standing balance support: Bilateral upper extremity supported;During functional activity Standing balance-Leahy Scale: Poor Standing balance comment: BUE reliance on RW                           ADL either performed or assessed with clinical judgement   ADL Overall ADL's : Needs assistance/impaired  General ADL Comments: NPO, set up and supervision for seated ADL tasks, dons socks using figure 4 pattern while bed level, CGA-MIN A for ADL transfers with AD, anticipate MIN-MOD A for LB ADL involving STS transfers with higher  level balance requirements     Vision Baseline Vision/History: Wears glasses Wears Glasses:  ("intermittently") Patient Visual Report: No change from baseline       Perception     Praxis      Pertinent Vitals/Pain Pain Assessment: No/denies pain     Hand Dominance Right   Extremity/Trunk Assessment Upper Extremity Assessment Upper Extremity Assessment: Overall WFL for tasks assessed (very mild occasional jerking but not overly functionally limiting)   Lower Extremity Assessment Lower Extremity Assessment: Defer to PT evaluation;Generalized weakness (grossly at least 4/5 bilat, R knee wound bandaged)   Cervical / Trunk Assessment Cervical / Trunk Assessment:  (head slightly forward, hunches)   Communication Communication Communication: No difficulties   Cognition Arousal/Alertness: Awake/alert Behavior During Therapy: WFL for tasks assessed/performed Overall Cognitive Status: Within Functional Limits for tasks assessed                                 General Comments: grossly WFL, oriented to self, place, year, month with cues, and states he's here "because they think I'm dying", follows commands, mildly impulsive with standing   General Comments       Exercises Other Exercises Other Exercises: Pt instructed in ADL transfers with AD to improve safety   Shoulder Instructions      Home Living Family/patient expects to be discharged to:: Private residence Living Arrangements: Spouse/significant other Available Help at Discharge: Family;Friend(s);Available PRN/intermittently;Available 24 hours/day (pt reports wife there "most of the time", 2 daughters around, and friend Elta [place] available PRN) Type of Home: House Home Access: Stairs to enter [employer] of Steps: 3 Entrance Stairs-Rails: Left Home Layout: One level;Other (Comment) (3 steps to enter bedroom w/ rails)     Bathroom Shower/Tub: Walk-in shower   Bathroom Toilet: Handicapped  height     Home Equipment: Environmental consultant - 2 wheels;Walker - 4 wheels;Shower seat - built in;Grab bars - tub/shower;Grab bars - toilet          Prior Functioning/Environment Level of Independence: Needs assistance  Gait / Transfers Assistance Needed: pt reports ambulating with 2WW, endorses 3+ falls (falls anteriorly) ADL's / Homemaking Assistance Needed: Pt reports generally indep with ADL, sometimes needs assist for LB dressing on L (hip sx ~27yr ago per pt report), pt and wife do medication mgt together; pt reports hired assist for meal prep and cleaning 5x/wk; and wife drives Communication / Swallowing Assistance Needed: decreased projection of voice          OT Problem List: Decreased coordination;Decreased activity tolerance;Decreased safety awareness;Impaired balance (sitting and/or standing)      OT Treatment/Interventions: Self-care/ADL training;Therapeutic exercise;Therapeutic activities;Neuromuscular education;Cognitive remediation/compensation;DME and/or AE instruction;Energy conservation;Patient/family education;Balance training    OT Goals(Current goals can be found in the care plan section) Acute Rehab OT Goals Patient Stated Goal: go home OT Goal Formulation: With patient Time For Goal Achievement: 10/18/20 Potential to Achieve Goals: Good ADL Goals Pt Will Transfer to Toilet: with supervision;ambulating;grab bars (comfort height/elevated toilet, LRAD for amb) Additional ADL Goal #1: Pt will perform simulated medication management task to correctly sort 3 medications into pill box organizer with supervision and PRN VC. Additional ADL Goal #2: Pt will perform morning ADL routine with supervision for  safety/stability with PRN VC for safety.  OT Frequency: Min 2X/week   Barriers to D/C:            Co-evaluation              AM-PAC OT "6 Clicks" Daily Activity     Outcome Measure Help from another person eating meals?: None (NPO, but physically could perform  without assist) Help from another person taking care of personal grooming?: A Little Help from another person toileting, which includes using toliet, bedpan, or urinal?: A Little Help from another person bathing (including washing, rinsing, drying)?: A Lot Help from another person to put on and taking off regular upper body clothing?: A Little Help from another person to put on and taking off regular lower body clothing?: A Lot 6 Click Score: 17   End of Session Equipment Utilized During Treatment: Gait belt;Rolling walker Nurse Communication: Mobility status;Other (comment) (IV complete, BPs)  Activity Tolerance: Patient tolerated treatment well Patient left: in bed;with call bell/phone within reach;with bed alarm set  OT Visit Diagnosis: Other abnormalities of gait and mobility (R26.89);Repeated falls (R29.6);Muscle weakness (generalized) (M62.81);Other symptoms and signs involving the nervous system (R29.898)                Time: 4695-0722 OT Time Calculation (min): 38 min Charges:  OT General Charges $OT Visit: 1 Visit OT Evaluation $OT Eval Moderate Complexity: 1 Mod OT Treatments $Self Care/Home Management : 23-37 mins  Casey Tucker, MPH, MS, OTR/L ascom [phone] 10/04/20, 1:12 PM   Casey Tucker 10/04/2020, 9:56 AM

## 2020-10-04 NOTE — Progress Notes (Signed)
[facility]) Hospital Liaison RN note  This is a current outpatient based palliative care patient with [facility].  We will follow for disposition.  Please call with any outpatient palliative care questions or concerns.  Thank you.  Margaretmary Eddy, BSN, RN [facility] Liaison [phone]

## 2020-10-04 NOTE — Progress Notes (Signed)
Pt lowered self to floor unassisted, witnessed by Dr. Loleta Books and Payton Mccallum, RN. No injuries noted, VSS. Family notified by MD just after the event.     10/04/20 1200  What Happened  Was fall witnessed? Yes  Who witnessed fall? MD Loleta Books Payton Mccallum, RN  Patients activity before fall ambulating-unassisted  Point of contact other (comment) (knees)  Was patient injured? No  Follow Up  MD notified Dr. Loleta Books  Time MD notified 1229  Family notified No - patient refusal  Time family notified 1229  Additional tests No  Simple treatment Other (comment) (none needed)  Progress note created (see row info) Yes  Adult Fall Risk Assessment  Risk Factor Category (scoring not indicated) High fall risk per protocol (document High fall risk)  Patient Fall Risk Level High fall risk  Adult Fall Risk Interventions  Required Bundle Interventions *See Row Information* High fall risk - low, moderate, and high requirements implemented  Additional Interventions Room near nurses station;Use of appropriate toileting equipment (bedpan, BSC, etc.)  Screening for Fall Injury Risk (To be completed on HIGH fall risk patients) - Assessing Need for Floor Mats  Risk For Fall Injury- Criteria for Floor Mats Previous fall this admission  Pain Assessment  Pain Scale 0-10  Pain Score 6  Pain Type Chronic pain  Pain Location Leg  Pain Frequency Constant  Pain Intervention(s) MD notified (Comment)  Neurological  Neuro (WDL) X  Level of Consciousness Alert  Speech Slurred/Dysarthria  Glasgow Coma Scale  Eye Opening 4  Best Verbal Response (NON-intubated) 5  Best Motor Response 6  Glasgow Coma Scale Score 15  Musculoskeletal  Musculoskeletal (WDL) X  Generalized Weakness Yes  Integumentary  Integumentary (WDL) X  Abrasion Location Knee  Abrasion Location Orientation Right

## 2020-10-04 NOTE — Telephone Encounter (Signed)
1 pm.  Message received from Zandra Abts, [facility] Liaison requesting Palliative Care follow up with patient's daughter [name], per daughter's request.  Phone call made to [name] who asks if she could conference her sister [name] into the call and I advised this was okay.  Joint call completed with myself, [name] and [name].  [name] provides an update on the hospital situation with her father. She stated her mother found the patient in the floor and unresponsive at home on the 16th but did not know what to do.  [name] states she had to tell her mother to contact 49.  [name] states her mother is also suffering from Dementia. She recalls a significant event yesterday where her father choked on his food, became cyanotic and unresponsive.  She stated a code blue was called and her and the patient's spouse witnessed this event.  [name] states this was very traumatic for her to witness and she did receive chaplain support.    Both [name] and [name] are very concerned about patient returning home.  They have attempted to provide in-home help but the patient and wife will decline services or "fire the help" when they come to the house.  Both daughters feel it is no longer safe for patient to return home under the care of his wife as she is dealing with her own cognitive impairments. Daughters state patient is unable to bathe himself and will not allow anyone to assist.  They believe the patient is taking his medications incorrectly. They are requesting patient be placed in a facility for 24/7 care.  They have spoken to the PCP who recommends [facility] and the daughters are agreeable with this as their mother could also be moved there.  They feel this will be a challenging task to accomplish as patient has been resistant to having any type of help.  Advised that in reviewing the chart, PT/OT/ST have not yet evaluated patient and made recommendations but this was likely coming soon.  Advised that patients are typically assigned a  case manager and I would see if I could follow up with this person regarding concerns the family has.  I have also encouraged the daughters to let patient's attending in the hospital know of the home situation and their desire for placement.   Phone call made to [facility] [place] Liaison Nurse.  She is updated on the above information and advised that daughters do not want patient to return home as it is no longer safe. Requested that a GOC be completed at some point during this hospitalization and that either the case manager or SW be notified of the home situation and family desires.  Dee, LPN has seen patient this am and will follow up with unit staff.

## 2020-10-05 LAB — CBC
HCT: 26 % — ABNORMAL LOW (ref 39.0–52.0)
Hemoglobin: 8.7 g/dL — ABNORMAL LOW (ref 13.0–17.0)
MCH: 37.2 pg — ABNORMAL HIGH (ref 26.0–34.0)
MCHC: 33.5 g/dL (ref 30.0–36.0)
MCV: 111.1 fL — ABNORMAL HIGH (ref 80.0–100.0)
Platelets: 270 10*3/uL (ref 150–400)
RBC: 2.34 MIL/uL — ABNORMAL LOW (ref 4.22–5.81)
RDW: 18.6 % — ABNORMAL HIGH (ref 11.5–15.5)
WBC: 4.7 10*3/uL (ref 4.0–10.5)
nRBC: 0 % (ref 0.0–0.2)

## 2020-10-05 LAB — SARS CORONAVIRUS 2 (TAT 6-24 HRS): SARS Coronavirus 2: NEGATIVE

## 2020-10-05 MED ORDER — MAGNESIUM HYDROXIDE 400 MG/5ML PO SUSP
15.0000 mL | Freq: Every day | ORAL | Status: DC | PRN
  Administered 2020-10-05 – 2020-10-06 (×2): 15 mL via ORAL
  Filled 2020-10-05 (×4): qty 30

## 2020-10-05 MED ORDER — CARBIDOPA-LEVODOPA 25-100 MG PO TABS
1.0000 | ORAL_TABLET | Freq: Every day | ORAL | Status: DC | PRN
  Administered 2020-10-05: 18:00:00 1 via ORAL
  Filled 2020-10-05 (×2): qty 1

## 2020-10-05 NOTE — Progress Notes (Signed)
[facility] PROGRESS NOTE    RODD HEFT  YIR:[id number] DOB: [birth year] DOA: 10/02/2020 PCP: [name], MD      Brief Narrative:  Mr. Sweitzer is a 80 y.o. M with hx Parkinson's s/p deep brain stimulator 2014, dementia, spinal stenosis with chronic pain and MDS followed by Dr. Tasia Catchings who presented with being found down and unresponsive.  Evidently was fatigued for a few days, progressively, then found unresponsive by family/caregiver.  Gradually more alert with EMS en route.  In the ER, anemia worse to 7.7 g/dL (from baseline 9), Cr and electrolytes normal.  WBC 22K, platelets normal. Dimer normal.CT head unremarkable.  CT abdomen and pelvis showed kidney stone without hydro or pyelo but also did show LLL pneumonia.  Started on antibiotics and admitted.       Assessment & Plan:  LLL pneumonia in setting of MDS -Continue cefpodoxime and azithromycin, day 4 of 5    Dysphagia and aspiration event See summary 3/18  -Dysphagia 2, thin, meds in puree  -All feeding after Sinemet and to be observed directly by family or nursing -Follow with palliative care       Anemia due to MDS Myelodysplastic syndrome Hgb stable -Continue iron  Parkinson's disease Dementia -Continue Sinemet -Add the PRN dose recommended by Dr. Carles Collet, although I am doubtful based on my evaluation that the patient consistently takes this appropriately   Severe protein calorie malnutrition  Neuropathy -Continue gabapentin          Disposition: Status is: Inpatient     Dispo: The patient is from: Home              Anticipated d/c is to: Home              Patient currently is not medically stable to d/c.   Difficult to place patient No    Patient admitted with pneumonia, found here to have aspiration.    At present, he is not able to ambulate household distances without supervision, which is his baseline. I suspect this is from his anemia and pneumonia and near  code event on admission, and that he would benefit from short term rehab to gain a prior level of function.     Level of care: Med-Surg       MDM: The below labs and imaging reports were reviewed and summarized above.  Medication management as above.    DVT prophylaxis: SCDs Start: 10/02/20 1939  Code Status: FULL Family Communication: wife at bedside, daughter by phone     Procedures:   3/16 CT head and c spine -- unremarkable  3/16 cXR - streaky opacity  3/16 abd x-ray -- nephrolith  3/16 carotids US =- <50% stenosis bilat  3/16 CT abd/pel -- nonobstrucint nephrolith, but also LLL pneumonia  Antimicrobials:   Vanc/Flagyl x1 3/16  Rocephin/Azithro 3/16 >>   Culture data:   3/16 blood culture x2 -- NGTD           Subjective: Did well overnight, no fever, sputum, aspiration, respiratory distress.  No chest pain or dyspnea.  He has some occasional cough.       Objective: Vitals:   10/05/20 0010 10/05/20 0453 10/05/20 0723 10/05/20 1239  BP: (!) 144/68 (!) 121/56 (!) 146/69 (!) 141/72  Pulse: 65 67 73 78  Resp: 18 18 15 16   Temp: 98.4 F (36.9 C) 98.4 F (36.9 C) 97.8 F (36.6 C) 97.7 F (36.5 C)  TempSrc:  SpO2: 98% 97% 98% 99%  Weight:      Height:        Intake/Output Summary (Last 24 hours) at 10/05/2020 1443 Last data filed at 10/05/2020 1138 Gross per 24 hour  Intake 120 ml  Output 1820 ml  Net -1700 ml   Filed Weights   10/03/20 1203  Weight: 68.5 kg    Examination: General appearance: Elderly adult male, lying in bed, no acute distress, watching television     HEENT:    Skin: No suspicious rashes or lesions, scattered ecchymoses, normal for age, no jaundice Cardiac: RRR, could not appreciate a murmur today, no lower extremity edema Respiratory: Normal respiratory rate and rhythm, lungs clear without rales or wheezes Abdomen: Abdomen soft without tenderness palpation or guarding MSK: Reduced muscle mass and  fat Neuro: Tremor, speech is somewhat dysarthric, extraocular movements intact, voice hypophonic Psych:      Data Reviewed: I have personally reviewed following labs and imaging studies:  CBC: Recent Labs  Lab 10/02/20 1711 10/03/20 0429 10/04/20 0543 10/05/20 0525  WBC 22.5* 7.0 6.1 4.7  NEUTROABS 20.7* 5.1  --   --   HGB 7.7* 6.7* 9.0* 8.7*  HCT 23.2* 21.0* 26.1* 26.0*  MCV 117.8* 122.8* 108.8* 111.1*  PLT 285 229 265 852   Basic Metabolic Panel: Recent Labs  Lab 10/02/20 1711 10/03/20 0429 10/04/20 0543  NA 134* 137 137  K 4.3 4.0 3.9  CL 101 107 107  CO2 27 25 25   GLUCOSE 119* 91 87  BUN 23 18 12   CREATININE 0.93 0.64 0.60*  CALCIUM 8.3* 7.8* 8.2*  MG 2.2 2.1  --   PHOS 3.0 3.1  --    GFR: Estimated Creatinine Clearance: 73.7 mL/min (A) (by C-G formula based on SCr of 0.6 mg/dL (L)). Liver Function Tests: Recent Labs  Lab 10/02/20 1711 10/03/20 0429  AST 8* 9*  ALT <5 6  ALKPHOS 51 46  BILITOT 2.0* 1.3*  PROT 5.8* 5.4*  ALBUMIN 3.5 3.1*   No results for input(s): LIPASE, AMYLASE in the last 168 hours. No results for input(s): AMMONIA in the last 168 hours. Coagulation Profile: Recent Labs  Lab 10/02/20 1916  INR 1.2   Cardiac Enzymes: Recent Labs  Lab 10/02/20 1711  CKTOTAL 24*   BNP (last 3 results) No results for input(s): PROBNP in the last 8760 hours. HbA1C: No results for input(s): HGBA1C in the last 72 hours. CBG: No results for input(s): GLUCAP in the last 168 hours. Lipid Profile: No results for input(s): CHOL, HDL, LDLCALC, TRIG, CHOLHDL, LDLDIRECT in the last 72 hours. Thyroid Function Tests: Recent Labs    10/02/20 1711 10/03/20 0429  TSH 2.899 4.029  FREET4 0.89  --    Anemia Panel: Recent Labs    10/02/20 1711 10/02/20 1916  VITAMINB12  --  760  FOLATE  --  8.7  FERRITIN  --  280  TIBC  --  244*  IRON  --  17*  RETICCTPCT 2.0  --    Urine analysis:    Component Value Date/Time   COLORURINE YELLOW (A)  10/02/2020 1916   APPEARANCEUR CLEAR (A) 10/02/2020 1916   APPEARANCEUR Clear 08/12/2018 1412   LABSPEC 1.014 10/02/2020 1916   LABSPEC 1.027 08/13/2011 1127   PHURINE 7.0 10/02/2020 1916   GLUCOSEU NEGATIVE 10/02/2020 1916   GLUCOSEU Negative 08/13/2011 1127   HGBUR NEGATIVE 10/02/2020 1916   BILIRUBINUR NEGATIVE 10/02/2020 1916   BILIRUBINUR Negative 08/12/2018 1412   BILIRUBINUR Negative  08/13/2011 1127   KETONESUR 20 (A) 10/02/2020 1916   PROTEINUR NEGATIVE 10/02/2020 1916   UROBILINOGEN 0.2 01/21/2018 1049   NITRITE NEGATIVE 10/02/2020 1916   LEUKOCYTESUR NEGATIVE 10/02/2020 1916   LEUKOCYTESUR Negative 08/13/2011 1127   Sepsis Labs: @LABRCNTIP (procalcitonin:4,lacticacidven:4)  ) Recent Results (from the past 240 hour(s))  Blood culture (routine x 2)     Status: None (Preliminary result)   Collection Time: 10/02/20  5:11 PM   Specimen: BLOOD  Result Value Ref Range Status   Specimen Description BLOOD LEFT ANTECUBITAL  Final   Special Requests   Final    BOTTLES DRAWN AEROBIC AND ANAEROBIC Blood Culture adequate volume   Culture   Final    NO GROWTH 3 DAYS Performed at [facility], [address], [place], [place] [address]    Report Status PENDING  Incomplete  Resp Panel by RT-PCR (Flu A&B, Covid) Nasopharyngeal Swab     Status: None   Collection Time: 10/02/20  7:16 PM   Specimen: Nasopharyngeal Swab; Nasopharyngeal(NP) swabs in vial transport medium  Result Value Ref Range Status   SARS Coronavirus 2 by RT PCR NEGATIVE NEGATIVE Final    Comment: (NOTE) SARS-CoV-2 target nucleic acids are NOT DETECTED.  The SARS-CoV-2 RNA is generally detectable in upper respiratory specimens during the acute phase of infection. The lowest concentration of SARS-CoV-2 viral copies this assay can detect is 138 copies/mL. A negative result does not preclude SARS-Cov-2 infection and should not be used as the sole basis for treatment or other patient management decisions.  A negative result may occur with  improper specimen collection/handling, submission of specimen other than nasopharyngeal swab, presence of viral mutation(s) within the areas targeted by this assay, and inadequate number of viral copies(<138 copies/mL). A negative result must be combined with clinical observations, patient history, and epidemiological information. The expected result is Negative.  Fact Sheet for Patients:  EntrepreneurPulse.com.au  Fact Sheet for Healthcare Providers:  IncredibleEmployment.be  This test is no t yet approved or cleared by the [place] FDA and  has been authorized for detection and/or diagnosis of SARS-CoV-2 by FDA under an Emergency Use Authorization (EUA). This EUA will remain  in effect (meaning this test can be used) for the duration of the COVID-19 declaration under Section 564(b)(1) of the Act, 21 U.S.C.section 360bbb-3(b)(1), unless the authorization is terminated  or revoked sooner.       Influenza A by PCR NEGATIVE NEGATIVE Final   Influenza B by PCR NEGATIVE NEGATIVE Final    Comment: (NOTE) The Xpert Xpress SARS-CoV-2/FLU/RSV plus assay is intended as an aid in the diagnosis of influenza from Nasopharyngeal swab specimens and should not be used as a sole basis for treatment. Nasal washings and aspirates are unacceptable for Xpert Xpress SARS-CoV-2/FLU/RSV testing.  Fact Sheet for Patients: EntrepreneurPulse.com.au  Fact Sheet for Healthcare Providers: IncredibleEmployment.be  This test is not yet approved or cleared by the [place] FDA and has been authorized for detection and/or diagnosis of SARS-CoV-2 by FDA under an Emergency Use Authorization (EUA). This EUA will remain in effect (meaning this test can be used) for the duration of the COVID-19 declaration under Section 564(b)(1) of the Act, 21 U.S.C. section 360bbb-3(b)(1), unless the authorization  is terminated or revoked.  Performed at [facility], [address] [place], [place] [address]   Urine culture     Status: Abnormal   Collection Time: 10/02/20  7:16 PM   Specimen: Urine, Random  Result Value Ref Range Status  Specimen Description   Final    URINE, RANDOM Performed at [facility], [address] [place], [place] [address]    Special Requests   Final    NONE Performed at [facility], [address] [place], [place] [address]    Culture (A)  Final    <10,000 COLONIES/mL INSIGNIFICANT GROWTH Performed at [place] [address] [place], [place] [address]    Report Status 10/04/2020 FINAL  Final  Blood culture (routine x 2)     Status: None (Preliminary result)   Collection Time: 10/02/20  7:31 PM   Specimen: BLOOD  Result Value Ref Range Status   Specimen Description BLOOD BLOOD RIGHT FOREARM  Final   Special Requests   Final    BOTTLES DRAWN AEROBIC AND ANAEROBIC Blood Culture results may not be optimal due to an inadequate volume of blood received in culture bottles   Culture   Final    NO GROWTH 3 DAYS Performed at [facility], [address] [place], [place] [address]    Report Status PENDING  Incomplete  SARS CORONAVIRUS 2 (TAT 6-24 HRS) Nasopharyngeal Nasopharyngeal Swab     Status: None   Collection Time: 10/04/20  5:04 PM   Specimen: Nasopharyngeal Swab  Result Value Ref Range Status   SARS Coronavirus 2 NEGATIVE NEGATIVE Final    Comment: (NOTE) SARS-CoV-2 target nucleic acids are NOT DETECTED.  The SARS-CoV-2 RNA is generally detectable in upper and lower respiratory specimens during the acute phase of infection. Negative results do not preclude SARS-CoV-2 infection, do not rule out co-infections with other pathogens, and should not be used as the sole basis for treatment or other patient management decisions. Negative results must be combined with clinical observations, patient  history, and epidemiological information. The expected result is Negative.  Fact Sheet for Patients: SugarRoll.be  Fact Sheet for Healthcare Providers: https://www.woods-mathews.com/  This test is not yet approved or cleared by the [place] FDA and  has been authorized for detection and/or diagnosis of SARS-CoV-2 by FDA under an Emergency Use Authorization (EUA). This EUA will remain  in effect (meaning this test can be used) for the duration of the COVID-19 declaration under Se ction 564(b)(1) of the Act, 21 U.S.C. section 360bbb-3(b)(1), unless the authorization is terminated or revoked sooner.  Performed at [facility], [place] [address], [place], [place] [address]          Radiology Studies: No results found.      Scheduled Meds:  azithromycin  500 mg Oral Daily   carbidopa-levodopa  1 tablet Oral 5 X Daily   And   carbidopa-levodopa  1 tablet Oral Daily   cefdinir  300 mg Oral Q12H   docusate sodium  100 mg Oral QODAY   feeding supplement  237 mL Oral TID BM   ferrous sulfate  325 mg Oral QODAY   gabapentin  100-200 mg Oral TID   multivitamin with minerals  1 tablet Oral Daily   Continuous Infusions:    LOS: 2 days    Time spent: 25 minutes    Edwin Dada, MD [facility] 10/05/2020, 2:43 PM     Please page though Maud or Epic secure chat:  For [employer], Adult nurse

## 2020-10-06 DIAGNOSIS — F0280 Dementia in other diseases classified elsewhere without behavioral disturbance: Secondary | ICD-10-CM | POA: Diagnosis not present

## 2020-10-06 DIAGNOSIS — D649 Anemia, unspecified: Secondary | ICD-10-CM | POA: Diagnosis not present

## 2020-10-06 DIAGNOSIS — N132 Hydronephrosis with renal and ureteral calculous obstruction: Secondary | ICD-10-CM | POA: Diagnosis not present

## 2020-10-06 DIAGNOSIS — D72829 Elevated white blood cell count, unspecified: Secondary | ICD-10-CM | POA: Diagnosis not present

## 2020-10-06 DIAGNOSIS — Z741 Need for assistance with personal care: Secondary | ICD-10-CM | POA: Diagnosis not present

## 2020-10-06 DIAGNOSIS — E440 Moderate protein-calorie malnutrition: Secondary | ICD-10-CM | POA: Diagnosis not present

## 2020-10-06 DIAGNOSIS — D2271 Melanocytic nevi of right lower limb, including hip: Secondary | ICD-10-CM | POA: Diagnosis not present

## 2020-10-06 DIAGNOSIS — M48061 Spinal stenosis, lumbar region without neurogenic claudication: Secondary | ICD-10-CM | POA: Diagnosis not present

## 2020-10-06 DIAGNOSIS — Z20822 Contact with and (suspected) exposure to covid-19: Secondary | ICD-10-CM | POA: Diagnosis not present

## 2020-10-06 DIAGNOSIS — R278 Other lack of coordination: Secondary | ICD-10-CM | POA: Diagnosis not present

## 2020-10-06 DIAGNOSIS — N200 Calculus of kidney: Secondary | ICD-10-CM | POA: Diagnosis not present

## 2020-10-06 DIAGNOSIS — X32XXXA Exposure to sunlight, initial encounter: Secondary | ICD-10-CM | POA: Diagnosis not present

## 2020-10-06 DIAGNOSIS — N3941 Urge incontinence: Secondary | ICD-10-CM | POA: Diagnosis not present

## 2020-10-06 DIAGNOSIS — N3281 Overactive bladder: Secondary | ICD-10-CM | POA: Diagnosis not present

## 2020-10-06 DIAGNOSIS — Z515 Encounter for palliative care: Secondary | ICD-10-CM | POA: Diagnosis not present

## 2020-10-06 DIAGNOSIS — L821 Other seborrheic keratosis: Secondary | ICD-10-CM | POA: Diagnosis not present

## 2020-10-06 DIAGNOSIS — N209 Urinary calculus, unspecified: Secondary | ICD-10-CM | POA: Diagnosis not present

## 2020-10-06 DIAGNOSIS — R4189 Other symptoms and signs involving cognitive functions and awareness: Secondary | ICD-10-CM | POA: Diagnosis not present

## 2020-10-06 DIAGNOSIS — Z85828 Personal history of other malignant neoplasm of skin: Secondary | ICD-10-CM | POA: Diagnosis not present

## 2020-10-06 DIAGNOSIS — J690 Pneumonitis due to inhalation of food and vomit: Secondary | ICD-10-CM | POA: Diagnosis not present

## 2020-10-06 DIAGNOSIS — M48062 Spinal stenosis, lumbar region with neurogenic claudication: Secondary | ICD-10-CM | POA: Diagnosis not present

## 2020-10-06 DIAGNOSIS — R0982 Postnasal drip: Secondary | ICD-10-CM | POA: Diagnosis not present

## 2020-10-06 DIAGNOSIS — Z886 Allergy status to analgesic agent status: Secondary | ICD-10-CM | POA: Diagnosis not present

## 2020-10-06 DIAGNOSIS — Z872 Personal history of diseases of the skin and subcutaneous tissue: Secondary | ICD-10-CM | POA: Diagnosis not present

## 2020-10-06 DIAGNOSIS — Z749 Problem related to care provider dependency, unspecified: Secondary | ICD-10-CM | POA: Diagnosis not present

## 2020-10-06 DIAGNOSIS — N201 Calculus of ureter: Secondary | ICD-10-CM | POA: Diagnosis not present

## 2020-10-06 DIAGNOSIS — M2550 Pain in unspecified joint: Secondary | ICD-10-CM | POA: Diagnosis not present

## 2020-10-06 DIAGNOSIS — K117 Disturbances of salivary secretion: Secondary | ICD-10-CM | POA: Diagnosis not present

## 2020-10-06 DIAGNOSIS — Z79899 Other long term (current) drug therapy: Secondary | ICD-10-CM | POA: Diagnosis not present

## 2020-10-06 DIAGNOSIS — R2689 Other abnormalities of gait and mobility: Secondary | ICD-10-CM | POA: Diagnosis not present

## 2020-10-06 DIAGNOSIS — M6281 Muscle weakness (generalized): Secondary | ICD-10-CM | POA: Diagnosis not present

## 2020-10-06 DIAGNOSIS — M79671 Pain in right foot: Secondary | ICD-10-CM | POA: Diagnosis not present

## 2020-10-06 DIAGNOSIS — E538 Deficiency of other specified B group vitamins: Secondary | ICD-10-CM | POA: Diagnosis not present

## 2020-10-06 DIAGNOSIS — W19XXXA Unspecified fall, initial encounter: Secondary | ICD-10-CM | POA: Diagnosis not present

## 2020-10-06 DIAGNOSIS — F39 Unspecified mood [affective] disorder: Secondary | ICD-10-CM | POA: Diagnosis not present

## 2020-10-06 DIAGNOSIS — Z4542 Encounter for adjustment and management of neuropacemaker (brain) (peripheral nerve) (spinal cord): Secondary | ICD-10-CM | POA: Diagnosis not present

## 2020-10-06 DIAGNOSIS — D2262 Melanocytic nevi of left upper limb, including shoulder: Secondary | ICD-10-CM | POA: Diagnosis not present

## 2020-10-06 DIAGNOSIS — R404 Transient alteration of awareness: Secondary | ICD-10-CM | POA: Diagnosis not present

## 2020-10-06 DIAGNOSIS — D469 Myelodysplastic syndrome, unspecified: Secondary | ICD-10-CM | POA: Diagnosis not present

## 2020-10-06 DIAGNOSIS — M545 Low back pain, unspecified: Secondary | ICD-10-CM | POA: Diagnosis not present

## 2020-10-06 DIAGNOSIS — E43 Unspecified severe protein-calorie malnutrition: Secondary | ICD-10-CM | POA: Diagnosis not present

## 2020-10-06 DIAGNOSIS — K219 Gastro-esophageal reflux disease without esophagitis: Secondary | ICD-10-CM | POA: Diagnosis not present

## 2020-10-06 DIAGNOSIS — D225 Melanocytic nevi of trunk: Secondary | ICD-10-CM | POA: Diagnosis not present

## 2020-10-06 DIAGNOSIS — M5136 Other intervertebral disc degeneration, lumbar region: Secondary | ICD-10-CM | POA: Diagnosis not present

## 2020-10-06 DIAGNOSIS — L570 Actinic keratosis: Secondary | ICD-10-CM | POA: Diagnosis not present

## 2020-10-06 DIAGNOSIS — Z7401 Bed confinement status: Secondary | ICD-10-CM | POA: Diagnosis not present

## 2020-10-06 DIAGNOSIS — G20 Parkinson's disease: Secondary | ICD-10-CM | POA: Diagnosis not present

## 2020-10-06 DIAGNOSIS — J189 Pneumonia, unspecified organism: Secondary | ICD-10-CM | POA: Diagnosis not present

## 2020-10-06 DIAGNOSIS — G3183 Dementia with Lewy bodies: Secondary | ICD-10-CM | POA: Diagnosis not present

## 2020-10-06 DIAGNOSIS — Z96642 Presence of left artificial hip joint: Secondary | ICD-10-CM | POA: Diagnosis not present

## 2020-10-06 DIAGNOSIS — F0150 Vascular dementia without behavioral disturbance: Secondary | ICD-10-CM | POA: Diagnosis not present

## 2020-10-06 DIAGNOSIS — M549 Dorsalgia, unspecified: Secondary | ICD-10-CM | POA: Diagnosis not present

## 2020-10-06 DIAGNOSIS — D2261 Melanocytic nevi of right upper limb, including shoulder: Secondary | ICD-10-CM | POA: Diagnosis not present

## 2020-10-06 DIAGNOSIS — Z23 Encounter for immunization: Secondary | ICD-10-CM | POA: Diagnosis not present

## 2020-10-06 DIAGNOSIS — G609 Hereditary and idiopathic neuropathy, unspecified: Secondary | ICD-10-CM | POA: Diagnosis not present

## 2020-10-06 DIAGNOSIS — R55 Syncope and collapse: Secondary | ICD-10-CM | POA: Diagnosis not present

## 2020-10-06 MED ORDER — MAGNESIUM HYDROXIDE 400 MG/5ML PO SUSP: 15.0000 mL | Freq: Every day | ORAL | 0 refills | Status: DC | PRN

## 2020-10-06 MED ORDER — ENSURE ENLIVE PO LIQD: 237.0000 mL | Freq: Three times a day (TID) | ORAL | 12 refills | Status: DC

## 2020-10-06 MED ORDER — DOCUSATE SODIUM 100 MG PO CAPS: 100.0000 mg | ORAL_CAPSULE | ORAL | 0 refills | Status: DC

## 2020-10-06 MED ORDER — FERROUS SULFATE 325 (65 FE) MG PO TABS: 325.0000 mg | ORAL_TABLET | ORAL | 3 refills | Status: DC

## 2020-10-06 MED ORDER — AZITHROMYCIN 500 MG PO TABS
500.0000 mg | ORAL_TABLET | Freq: Every day | ORAL | Status: AC
  Administered 2020-10-06: 11:00:00 500 mg via ORAL
  Filled 2020-10-06: qty 1

## 2020-10-06 MED ORDER — CARBIDOPA-LEVODOPA 25-100 MG PO TABS: 1.0000 | ORAL_TABLET | Freq: Every day | ORAL | Status: DC | PRN

## 2020-10-06 MED ORDER — CEFDINIR 300 MG PO CAPS: 300.0000 mg | ORAL_CAPSULE | Freq: Two times a day (BID) | ORAL | Status: AC

## 2020-10-06 NOTE — Progress Notes (Signed)
Report called to [place] RN at [facility] for patient discharge from [facility] via EMS transport to facility. All personal belongings packed up in patient's room. Patient educated on discharge as well. No further concerns noted. Awaiting transport

## 2020-10-06 NOTE — TOC Transition Note (Signed)
Transition of Care [facility]) - CM/SW Discharge Note   Patient Details  Name: BRYNDAN BILYK MRN: [id number] Date of Birth: 1940/09/20  Transition of [facility]) CM/SW Contact:  Boris Sharper, LCSW Phone Number: 10/06/2020, 1:57 PM   Clinical Narrative:    Pt medically stable for discharge per MD. Pt will be transported via EMS to [facility] room 109, call to report number is [phone]. CSW notified family of discharge. CSW will arrange EMS transport once nursing staff is ready.         Patient Goals and CMS Choice        Discharge Placement                       Discharge Plan and Services                                     Social Determinants of Health (SDOH) Interventions     Readmission Risk Interventions No flowsheet data found.

## 2020-10-06 NOTE — NC FL2 (Signed)
[place] LEVEL OF CARE SCREENING TOOL     IDENTIFICATION  Patient Name: Casey Tucker Birthdate: Jun 03, 1941 Sex: male Admission Date (Current Location): 10/02/2020  [place] and [place] Number:  Engineering geologist and Address:  [facility], [address], [place], [place] [address]      Provider Number: [id number]  Attending Physician Name and Address:  Edwin Dada, *  Relative Name and Phone Number:  Stormy Card [phone]    Current Level of Care: Hospital Recommended Level of Care: [place] Prior Approval Number:    Date Approved/Denied:   PASRR Number: [id number]  Discharge Plan: SNF    Current Diagnoses: Patient Active Problem List   Diagnosis Date Noted  . Protein-calorie malnutrition, severe 10/04/2020  . Left lower lobe pneumonia 10/03/2020  . Syncope and collapse 10/02/2020  . Leukocytosis 10/02/2020  . Ureteral stone with hydronephrosis 10/02/2020  . B12 deficiency 04/03/2020  . MDS (myelodysplastic syndrome) ([place]) 04/03/2020  . Goals of care, counseling/discussion 04/03/2020  . Syncope 02/07/2020  . Anemia 02/07/2020  . Leukopenia 02/07/2020  . Neurogenic urinary incontinence 11/02/2019  . Vascular dementia without behavioral disturbance ([place]) 07/04/2019  . Mood disorder ([place]) 07/04/2019  . Spinal stenosis, lumbar region with neurogenic claudication 04/07/2019  . Irregular heart beat 04/07/2019  . Malnutrition of mild degree ([place]) 04/07/2019  . Urinary frequency 01/21/2018  . Advanced directives, counseling/discussion 04/20/2014  . Routine general medical examination at a health care facility 03/16/2012  . 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 Osteoarthrosis, hip 08/12/2011  . Parkinson's disease ([place]) 01/29/2011  . GILBERT'S SYNDROME 12/09/2007  . GERD 12/09/2007  . RENAL CALCULUS, HX OF 12/09/2007    Orientation RESPIRATION BLADDER Height & Weight      Self,Time,Situation,Place  Normal Continent Weight: 151 lb (68.5 kg) Height:  6' (182.9 cm)  BEHAVIORAL SYMPTOMS/MOOD NEUROLOGICAL BOWEL NUTRITION STATUS      Continent    AMBULATORY STATUS COMMUNICATION OF NEEDS Skin   Extensive Assist Verbally Skin abrasions                       Personal Care Assistance Level of Assistance  Dressing,Bathing Bathing Assistance: Limited assistance   Dressing Assistance: Limited assistance     Functional Limitations Info  Speech     Speech Info: Impaired    SPECIAL CARE FACTORS FREQUENCY  PT (By licensed PT),OT (By licensed OT)     PT Frequency: 5 times per week OT Frequency: 3 times per week            Contractures Contractures Info: Not present    Additional Factors Info  Code Status,Allergies Code Status Info: Full code Allergies Info: Amoxicillin, Celebrex Celecoxib, Demerol, Penicillins           Current Medications (10/06/2020):  This is the current hospital active medication list Current Facility-Administered Medications  Medication Dose Route Frequency Provider Last Rate Last Admin  . acetaminophen (TYLENOL) tablet 650 mg  650 mg Oral Q6H PRN Toy Baker, MD       Or  . acetaminophen (TYLENOL) suppository 650 mg  650 mg Rectal Q6H PRN Doutova, Anastassia, MD      . carbidopa-levodopa (SINEMET IR) 25-100 MG per tablet immediate release 1 tablet  1 tablet Oral 5 X Daily Dorothe Pea, RPH   1 tablet at 10/06/20 1125   And  . carbidopa-levodopa (SINEMET IR) 25-100 MG per tablet immediate release 1 tablet  1 tablet Oral Daily Dorothe Pea, RPH   1 tablet at 10/06/20 1126  . carbidopa-levodopa (SINEMET IR) 25-100 MG per tablet immediate release 1 tablet  1 tablet Oral Daily PRN Edwin Dada, MD   1 tablet at 10/05/20 1820  . cefdinir (OMNICEF) capsule 300 mg  300 mg Oral Q12H Danford, Suann Larry, MD   300 mg at 10/06/20 0959  . docusate sodium (COLACE) capsule 100 mg  100 mg Oral QODAY  Edwin Dada, MD   100 mg at 10/05/20 0821  . feeding supplement (ENSURE ENLIVE / ENSURE PLUS) liquid 237 mL  237 mL Oral TID BM Edwin Dada, MD   237 mL at 10/06/20 0959  . ferrous sulfate tablet 325 mg  325 mg Oral Coralyn Pear, MD   325 mg at 10/05/20 7353  . gabapentin (NEURONTIN) capsule 100-200 mg  100-200 mg Oral TID Toy Baker, MD   200 mg at 10/06/20 0957  . HYDROcodone-acetaminophen (NORCO/VICODIN) 5-325 MG per tablet 1-2 tablet  1-2 tablet Oral Q4H PRN Toy Baker, MD   1 tablet at 10/05/20 2221  . magnesium hydroxide (MILK OF MAGNESIA) suspension 15 mL  15 mL Oral Daily PRN Edwin Dada, MD   15 mL at 10/05/20 1831  . menthol-cetylpyridinium (CEPACOL) lozenge 3 mg  1 lozenge Oral PRN Dove, Tasha A, NP   3 mg at 10/05/20 1840  . multivitamin with minerals tablet 1 tablet  1 tablet Oral Daily Danford, Suann Larry, MD   1 tablet at 10/06/20 2992     Discharge Medications: Please see discharge summary for a list of discharge medications.  Relevant Imaging Results:  Relevant Lab Results:   Additional Information SS number [id number]  Boris Sharper, LCSW

## 2020-10-06 NOTE — Discharge Summary (Signed)
Physician Discharge Summary  Casey Tucker NID:[id number] DOB: 15-Jan-1941 DOA: 10/02/2020  PCP: [name], MD  Admit date: 10/02/2020 Discharge date: 10/06/2020  Admitted From: Home  Disposition:  [facility] for short-term rehab   Recommendations for Outpatient Follow-up:  1. Follow up with PCP Dr. Silvio Pate within 1-2 weeks of SNF discharge 2. Follow up with Neurology Dr. Carles Collet within 2-4 weeks 3. Follow up with Hematology Dr. Janese Banks within 4-6 weeks for anemia 4. Please finish course of cefdinir 300 BID tomorrow morning  5. Please obtain CBC to check Hgb in 10 days 6. 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 Tasia Catchings who presented with being found down and unresponsive.  Evidently was fatigued for a few days, progressively, then found unresponsive by wife.  EMS were activated, he gradually became more alert en route.  In the ER, anemia worse to 7.7 g/dL (from baseline 9), Cr and electrolytes normal.  WBC 22K, platelets normal. Dimer normal.  CT head unremarkable.  CT abdomen and pelvis showed kidney stone without hydro or pyelo but also did show LLL pneumonia.  Started on antibiotics and admitted.        PRINCIPAL HOSPITAL DIAGNOSIS: Aspiration pneumonia    Discharge Diagnoses:   LLL pneumonia in setting of MDS and probably Parkinson's disease related aspiration Treated with 5 days Rocephin and azithromycin.       Dsyphagia and aspiration event On  the second hospital day, he had an aspiration event while eating, nearly had a CODE.  In setting of possibly aspiration pneumonia, and his advancing dysphagia in the setting of Parkinson's, there is a clear concern for risk of future aspirations.  On modified barium, he was observed to aspirate all consistencies of food.  Given this, SLP and I shared with patient, daughters, wife that the patient has risk of aspiration pneumonia and sequelae of same with any consistency of food intake.      They have discussed together, and the patient is clear, and family agree he wishes to avoid the most extreme approach of feeding tube, esp given this may not reduce the longterm risk of aspiration pneumonia.  For that reason, he should adhere to: -Minced consistency diet -Thin liquids -Meds in puree -All feeding after Sinemet and to be observed directly by family or nursing  -He should follow with Palliative care at his SNF< as well as his PCP and Neurologist, to continue discussions of prognosis and utility of a PEG tube.  If a minced diet is too onerous, the options appear to be to liberalize to completely regular diet for palliative/QOL purposes or to transition to PEG tube       Anemia due to MDS Myelodysplastic syndrome Hgb down to 6.7 at admission.  Transfused 1 unit and Hgb up to 9.  Smear consistent with MDS, no blasts. Low suspicion for AML trans.   Iron low.  Started on iron.  Should follow up with Hematology in 4-6 weeks and have Hgb  checked again in 10 days.    Parkinson's disease Dementia Continue Sinemet: Sinemet 50-200 at 7am 25-100 at 10a 25-100 at 1p 25-100 at 4p  25-100 at 7p  Patient may also take a 25-100 mg tab PRN once daily but not every day      Neuropathy Continue gabapentin   Severe protein calorie malnutrition Recommend a juice-consistency protein supplementtwice daily              Discharge Instructions  Discharge Instructions     Discharge instructions   Complete by: As directed    For the pneumonia: Take cefdinir 300 mg twice daily for 2 more doses (finishing Monday morning)  Continue your Sinemet as usual  Go see Dr. Carles Collet in 2-4 weeks Parkinson's disease, take family with you  Go see Dr. Janese Banks in 2-4 weeks to discuss anemia, take family with you  For now, adhere to a "minced" diet, with thin liquids and medicines crushed in applesauce Work with Speech therapy on swallowing Recognize that there is a high risk of aspiration, no matter what we do, monitor for signs of infection (fever, worsening cough, chest pain)  Take iron Have your blood level checked in 10 days   Increase activity slowly   Complete by: As directed      Allergies as of 10/06/2020      Reactions   Amoxicillin    Celebrex [celecoxib]    Demerol Other (See Comments)   Hallucinations    Penicillins    REACTION: RASH      Medication List    TAKE these medications   carbidopa-levodopa 25-100 MG tablet Commonly known as: SINEMET IR TAKE 2 AT 7:00, 1 AT 10AM,1 AT 1PM, 1 AT 4PM AND 1 AT 7PM What changed: Another medication with the same name was added. Make sure you understand how and when to take each.   carbidopa-levodopa 25-100 MG tablet Commonly known as: SINEMET IR Take 1 tablet by mouth daily as needed (Bradykinesia). What changed: You were already taking a medication with the same name, and this prescription was added. Make sure you understand how and when to take each.   cefdinir 300 MG capsule Commonly known as: OMNICEF Take 1 capsule (300 mg total) by mouth every 12 (twelve) hours for 1 day.   docusate sodium 100 MG capsule Commonly known as: COLACE Take 1 capsule (100 mg total) by mouth every other day. Start taking on: October 07, 2020   feeding supplement Liqd Take 237 mLs by mouth 3 (three) times daily between meals.   ferrous sulfate 325 (65 FE) MG tablet Take 1 tablet (325 mg total) by mouth every other  day. Start taking on: October 07, 2020   gabapentin 100 MG capsule Commonly known as: NEURONTIN TAKE ONE CAPSULE THREE TIMES A DAY AND TWO CAPSULES AT BEDTIME.   magnesium hydroxide 400 MG/5ML suspension Commonly known as: MILK OF MAGNESIA Take 15 mLs by mouth daily as needed for mild constipation.       Follow-up Information    [name], MD. Schedule an appointment as soon as possible for a visit.   Specialties: Internal Medicine, Pediatrics Why: Schedule within 1 week of discharge from rehab Contact information: [place] [address] [phone]        Tat, Eustace Quail, DO. Schedule an appointment as soon as possible for a visit in 2 week(s).   Specialty: Neurology Contact information: [address] [place] [address]  [phone]        Sindy Guadeloupe, MD. Schedule an appointment as soon as possible for a visit in 2 week(s).   Specialty: Oncology Contact information: [place] [address] [phone]              Allergies  Allergen Reactions  . Amoxicillin   . Celebrex [Celecoxib]   . Demerol Other (See Comments)    Hallucinations   . Penicillins     REACTION: RASH    Consultations:  Palliative Care  Speech therapy   Procedures/Studies: DG Chest 2 View  Result Date: 10/02/2020 CLINICAL DATA:  Syncope. EXAM: CHEST - 2 VIEW COMPARISON:  January 18, 2020. FINDINGS: The heart size and mediastinal contours are within normal limits. Both lungs are clear. The visualized skeletal structures are unremarkable. IMPRESSION: No active cardiopulmonary disease. Electronically Signed   By: Marijo Conception M.D.   On: 10/02/2020 18:10   DG Abd 1 View  Result Date: 10/02/2020 CLINICAL DATA:  Constipation. EXAM: ABDOMEN - 1 VIEW COMPARISON:  None. FINDINGS: No bowel dilatation to suggest obstruction. Small to moderate colonic stool burden. No abnormal rectal distention. Small calcifications  project in the right abdomen at the level L3-L4 level. Telemetry leads overlie the abdomen in this region. Left hip arthroplasty. Multilevel degenerative change in the spine. There are vascular calcifications. IMPRESSION: 1. Nonobstructive bowel gas pattern. Small to moderate colonic stool burden. 2. Small calcifications in the right abdomen at the level of L3-L4, indeterminate. These may represent stones in the lower right kidney, gallstones, or overlying artifact. Electronically Signed   By: Keith Rake M.D.   On: 10/02/2020 19:40   CT Head Wo Contrast  Result Date: 10/02/2020 CLINICAL DATA:  Dizziness, nonspecific. Neck trauma. Additional history provided: Unresponsive at home, weakness for several days, history of Graves and Parkinson's. EXAM: CT HEAD WITHOUT CONTRAST CT CERVICAL SPINE WITHOUT CONTRAST TECHNIQUE: Multidetector CT imaging of the head and cervical spine was performed following the standard protocol without intravenous contrast. Multiplanar CT image reconstructions of the cervical spine were also generated. COMPARISON:  Head CT 02/07/2020.  Brain MRI 03/06/2015. FINDINGS: CT HEAD FINDINGS Brain: Unchanged position of a left frontal approach deep brain stimulator which terminates in the left subthalamic region. There is no acute intracranial hemorrhage. No demarcated cortical infarct. No extra-axial fluid collection. No evidence of intracranial mass. No midline shift. Vascular: No hyperdense vessel.  Atherosclerotic calcifications. Skull: Left frontoparietal burr hole at site of DBS lead entry. No calvarial fracture or focal suspicious osseous lesion. Sinuses/Orbits: Visualized orbits show no acute finding. No significant paranasal sinus disease at the imaged levels. CT CERVICAL SPINE FINDINGS Alignment: Straightening of the expected cervical lordosis. 2 mm C4-C5 grade 1 anterolisthesis. Skull base and vertebrae: The basion-dental and atlanto-dental intervals are maintained.No evidence of  acute fracture to the cervical spine. Soft tissues and spinal canal: No prevertebral fluid or swelling. No visible canal hematoma. Disc levels: Cervical spondylosis. Most notably at C5-C6, there is moderate to moderately advanced disc space narrowing with a disc bulge, endplate spurring and uncovertebral hypertrophy. Bilateral bony neural foraminal narrowing and apparent mild spinal canal stenosis at this level. Upper chest: No consolidation within the imaged lung apices. No visible pneumothorax. Other: A deep brain stimulator lead traverses the left neck. IMPRESSION: CT head: 1. No evidence of acute intracranial abnormality. 2. Unchanged position of a left frontal approach deep brain stimulator lead. CT cervical spine: 1. No evidence of acute fracture to the cervical  spine. 2. 2 mm C4-C5 grade 1 anterolisthesis. 3. Cervical spondylosis, greatest at C5-C6. Electronically Signed   By: Kellie Simmering DO   On: 10/02/2020 17:56   CT Cervical Spine Wo Contrast  Result Date: 10/02/2020 CLINICAL DATA:  Dizziness, nonspecific. Neck trauma. Additional history provided: Unresponsive at home, weakness for several days, history of Graves and Parkinson's. EXAM: CT HEAD WITHOUT CONTRAST CT CERVICAL SPINE WITHOUT CONTRAST TECHNIQUE: Multidetector CT imaging of the head and cervical spine was performed following the standard protocol without intravenous contrast. Multiplanar CT image reconstructions of the cervical spine were also generated. COMPARISON:  Head CT 02/07/2020.  Brain MRI 03/06/2015. FINDINGS: CT HEAD FINDINGS Brain: Unchanged position of a left frontal approach deep brain stimulator which terminates in the left subthalamic region. There is no acute intracranial hemorrhage. No demarcated cortical infarct. No extra-axial fluid collection. No evidence of intracranial mass. No midline shift. Vascular: No hyperdense vessel.  Atherosclerotic calcifications. Skull: Left frontoparietal burr hole at site of DBS lead entry.  No calvarial fracture or focal suspicious osseous lesion. Sinuses/Orbits: Visualized orbits show no acute finding. No significant paranasal sinus disease at the imaged levels. CT CERVICAL SPINE FINDINGS Alignment: Straightening of the expected cervical lordosis. 2 mm C4-C5 grade 1 anterolisthesis. Skull base and vertebrae: The basion-dental and atlanto-dental intervals are maintained.No evidence of acute fracture to the cervical spine. Soft tissues and spinal canal: No prevertebral fluid or swelling. No visible canal hematoma. Disc levels: Cervical spondylosis. Most notably at C5-C6, there is moderate to moderately advanced disc space narrowing with a disc bulge, endplate spurring and uncovertebral hypertrophy. Bilateral bony neural foraminal narrowing and apparent mild spinal canal stenosis at this level. Upper chest: No consolidation within the imaged lung apices. No visible pneumothorax. Other: A deep brain stimulator lead traverses the left neck. IMPRESSION: CT head: 1. No evidence of acute intracranial abnormality. 2. Unchanged position of a left frontal approach deep brain stimulator lead. CT cervical spine: 1. No evidence of acute fracture to the cervical spine. 2. 2 mm C4-C5 grade 1 anterolisthesis. 3. Cervical spondylosis, greatest at C5-C6. Electronically Signed   By: Kellie Simmering DO   On: 10/02/2020 17:56   CT ABDOMEN PELVIS W CONTRAST  Result Date: 10/02/2020 CLINICAL DATA:  Cholelithiasis, constipation EXAM: CT ABDOMEN AND PELVIS WITH CONTRAST TECHNIQUE: Multidetector CT imaging of the abdomen and pelvis was performed using the standard protocol following bolus administration of intravenous contrast. CONTRAST:  144mL OMNIPAQUE IOHEXOL 300 MG/ML  SOLN COMPARISON:  02/02/2005, 10/02/2020 FINDINGS: Lower chest: Ground-glass airspace disease is seen throughout the visualized right lung base. Bibasilar areas of subpleural scarring are noted. No effusion or pneumothorax. Hepatobiliary: No focal liver  abnormality is seen. No gallstones, gallbladder wall thickening, or biliary dilatation. Pancreas: Unremarkable. No pancreatic ductal dilatation or surrounding inflammatory changes. Spleen: Normal in size without focal abnormality. Adrenals/Urinary Tract: There are multiple nonobstructing less than 4 mm calculi within the lower pole right kidney. Additionally, there is a 5 mm partially obstructing proximal right ureteral calculus, with mild right-sided obstructive uropathy. Normal excretion of contrast on delayed imaging. There are bilateral peripelvic renal cysts. The kidneys enhance normally and symmetrically. The adrenals and bladder are unremarkable. Stomach/Bowel: No bowel obstruction or ileus. Moderate retained stool throughout the colon. The appendix is surgically absent. No bowel wall thickening or inflammatory change. Vascular/Lymphatic: Aortic atherosclerosis. No enlarged abdominal or pelvic lymph nodes. Reproductive: Prostate is unremarkable. Other: No free fluid or free gas.  No abdominal wall hernia. Musculoskeletal: No acute or destructive  bony lesions. Unremarkable left hip arthroplasty. Reconstructed images demonstrate no additional findings. IMPRESSION: 1. 5 mm proximal right ureteral calculus, with mild right-sided hydronephrosis. The calculus is nonobstructing, as normal excretion of contrast can be seen traversing the calculus on delayed imaging. 2. Other nonobstructing less than 4 mm right renal calculi, corresponding to the calcifications seen on previous x-ray. 3. Ground-glass airspace disease throughout the right lung base. Findings could reflect infection, inflammation, or aspiration. Asymmetric edema is thought to be unlikely. 4. Aortic Atherosclerosis (ICD10-I70.0) and Emphysema (ICD10-J43.9). Electronically Signed   By: Randa Ngo M.D.   On: 10/02/2020 23:13   US Carotid Bilateral  Result Date: 10/02/2020 CLINICAL DATA:  Syncope, dizziness, weakness for several days EXAM: BILATERAL  CAROTID DUPLEX ULTRASOUND TECHNIQUE: Pearline Cables scale imaging, color Doppler and duplex ultrasound were performed of bilateral carotid and vertebral arteries in the neck. COMPARISON:  None. FINDINGS: Criteria: Quantification of carotid stenosis is based on velocity parameters that correlate the residual internal carotid diameter with NASCET-based stenosis levels, using the diameter of the distal internal carotid lumen as the denominator for stenosis measurement. The following velocity measurements were obtained: RIGHT ICA: 79/12 cm/sec CCA: 660/63 cm/sec SYSTOLIC ICA/CCA RATIO:  0.6 ECA: 182 cm/sec LEFT ICA: 85/17 cm/sec CCA: 016/01 cm/sec SYSTOLIC ICA/CCA RATIO:  0.8 ECA: 154 cm/sec RIGHT VERTEBRAL ARTERY:  Antegrade LEFT VERTEBRAL ARTERY:  Antegrade IMPRESSION: 1. Estimated 0-49% stenosis within the bilateral internal carotid arteries. Electronically Signed   By: Randa Ngo M.D.   On: 10/02/2020 21:04   DG Swallowing Func-Speech Pathology  Result Date: 10/03/2020 Objective Swallowing Evaluation: Type of Study: MBS-Modified Barium Swallow Study  Patient Details Name: YUVAN MEDINGER MRN: [id number] Date of Birth: [birth year] Today's Date: 10/03/2020 Time: SLP Start Time (ACUTE ONLY): 1405 -SLP Stop Time (ACUTE ONLY): 1530 SLP Time Calculation (min) (ACUTE ONLY): 85 min Past Medical History: Past Medical History: Diagnosis Date . Arthritis   left hip  . GERD (gastroesophageal reflux disease) Pre 2002  Mild . History of kidney stones  . 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 Parkinson disease ([place]) 2004  Slowly progressive . Ruptured appendix teens Past Surgical History: Past Surgical History: Procedure Laterality Date . APPENDECTOMY   . CATARACT EXTRACTION W/ INTRAOCULAR LENS  IMPLANT, BILATERAL  2017 . DEEP BRAIN STIMULATOR PLACEMENT  8/14  L STN . DOPPLER ECHOCARDIOGRAPHY  01/10/2009  LV NML Mild LVH EF 60-65% aortic sclerosis w/0 stenosis   . JOINT REPLACEMENT    left hip replacement 08/14/11/[facility]  . SUBTHALAMIC STIMULATOR BATTERY REPLACEMENT Left 03/25/2017  Procedure: Deep Brain Stimulator battery replacement;  Surgeon: Erline Levine, MD;  Location: [facility];  Service: Neurosurgery;  Laterality: Left;  left . TONSILLECTOMY   . TOTAL HIP REVISION  08/27/2011  Procedure: TOTAL HIP REVISION;  Surgeon: Mauri Pole, MD;  Location: [facility];  Service: Orthopedics;  Laterality: Left; HPI: Kassem Buddenhagen is a 80 y.o. male with history of Parkinson's disease s/p DBS 2014, dementia, spinal stenosis, myelodysplastic syndrome who presented to ED after being found unresponsive by family. CXR shows left lower lobe PNA. Just prior to SLP arrival for swallow assessment, pt was eating and appeared to aspirate (observed by family) and then lost consciousness. Vital signs stabilized; discussed with pt, family and MD and opted to proceed with MBS for evaluation of swallow function. Clinical swallow evaluation found in chart from 2019; regular/thin was recommended with therapist noting wet vocal quality after multiple or large  sips of thin.  Subjective: "It was the steak." Assessment / Plan / Recommendation CHL IP CLINICAL IMPRESSIONS 10/03/2020 Clinical Impression Patient SLP Visit Diagnosis Dysphagia, oropharyngeal phase (R13.12) Attention and concentration deficit following -- Frontal lobe and executive function deficit following -- Impact on safety and function Moderate aspiration risk;Severe aspiration risk;Risk for inadequate nutrition/hydration   CHL IP TREATMENT RECOMMENDATION 10/03/2020 Treatment Recommendations Therapy as outlined in treatment plan below   Prognosis 10/03/2020 Prognosis for Safe Diet Advancement Guarded Barriers to Reach Goals Cognitive deficits;Severity of deficits Barriers/Prognosis Comment -- CHL IP DIET RECOMMENDATION 10/03/2020 SLP Diet Recommendations NPO except meds;Ice chips PRN after oral care Liquid Administration via Cup Medication  Administration Crushed with puree Compensations Slow rate;Small sips/bites;Multiple dry swallows after each bite/sip Postural Changes Seated upright at 90 degrees;Remain semi-upright after after feeds/meals (Comment)   CHL IP OTHER RECOMMENDATIONS 10/03/2020 Recommended Consults Other (Comment) Oral Care Recommendations Oral care QID;Oral care before and after PO;Staff/trained caregiver to provide oral care Other Recommendations Have oral suction available   CHL IP FOLLOW UP RECOMMENDATIONS 10/03/2020 Follow up Recommendations Other (comment)   CHL IP FREQUENCY AND DURATION 10/03/2020 Speech Therapy Frequency (ACUTE ONLY) min 2x/week Treatment Duration 2 weeks      CHL IP ORAL PHASE 10/03/2020 Oral Phase Impaired Oral - Pudding Teaspoon -- Oral - Pudding Cup -- Oral - Honey Teaspoon Weak lingual manipulation;Reduced posterior propulsion;Lingual/palatal residue Oral - Honey Cup Weak lingual manipulation;Reduced posterior propulsion;Lingual/palatal residue Oral - Nectar Teaspoon Weak lingual manipulation;Reduced posterior propulsion;Lingual/palatal residue Oral - Nectar Cup -- Oral - Nectar Straw -- Oral - Thin Teaspoon Weak lingual manipulation;Reduced posterior propulsion;Lingual/palatal residue Oral - Thin Cup -- Oral - Thin Straw -- Oral - Puree Weak lingual manipulation;Reduced posterior propulsion;Lingual/palatal residue Oral - Mech Soft -- Oral - Regular -- Oral - Multi-Consistency -- Oral - Pill -- Oral Phase - Comment --  CHL IP PHARYNGEAL PHASE 10/03/2020 Pharyngeal Phase Impaired Pharyngeal- Pudding Teaspoon -- Pharyngeal -- Pharyngeal- Pudding Cup -- Pharyngeal -- Pharyngeal- Honey Teaspoon Delayed swallow initiation-pyriform sinuses;Reduced epiglottic inversion;Reduced anterior laryngeal mobility;Reduced tongue base retraction;Penetration/Apiration after swallow;Pharyngeal residue - valleculae;Pharyngeal residue - pyriform;Pharyngeal residue - cp segment Pharyngeal Material does not enter airway;Material  enters airway, passes BELOW cords without attempt by patient to eject out (silent aspiration) Pharyngeal- Honey Cup Delayed swallow initiation-pyriform sinuses;Reduced epiglottic inversion;Reduced anterior laryngeal mobility;Reduced laryngeal elevation;Reduced tongue base retraction;Penetration/Apiration after swallow;Pharyngeal residue - valleculae;Pharyngeal residue - pyriform;Pharyngeal residue - cp segment Pharyngeal Material enters airway, passes BELOW cords without attempt by patient to eject out (silent aspiration) Pharyngeal- Nectar Teaspoon Delayed swallow initiation-pyriform sinuses;Reduced epiglottic inversion;Reduced anterior laryngeal mobility;Reduced laryngeal elevation;Reduced tongue base retraction;Penetration/Apiration after swallow;Moderate aspiration;Pharyngeal residue - valleculae;Pharyngeal residue - pyriform;Pharyngeal residue - cp segment Pharyngeal Material enters airway, passes BELOW cords without attempt by patient to eject out (silent aspiration) Pharyngeal- Nectar Cup -- Pharyngeal -- Pharyngeal- Nectar Straw -- Pharyngeal -- Pharyngeal- Thin Teaspoon Delayed swallow initiation-pyriform sinuses;Reduced epiglottic inversion;Reduced anterior laryngeal mobility;Reduced laryngeal elevation;Reduced tongue base retraction;Penetration/Apiration after swallow;Pharyngeal residue - valleculae;Pharyngeal residue - pyriform;Pharyngeal residue - cp segment;Moderate aspiration Pharyngeal Material does not enter airway;Material enters airway, passes BELOW cords without attempt by patient to eject out (silent aspiration) Pharyngeal- Thin Cup -- Pharyngeal -- Pharyngeal- Thin Straw -- Pharyngeal -- Pharyngeal- Puree Delayed swallow initiation-vallecula;Reduced epiglottic inversion;Reduced laryngeal elevation;Reduced anterior laryngeal mobility;Reduced tongue base retraction;Pharyngeal residue - valleculae;Pharyngeal residue - pyriform;Pharyngeal residue - cp segment Pharyngeal Material does not enter  airway Pharyngeal- Mechanical Soft -- Pharyngeal -- Pharyngeal- Regular -- Pharyngeal -- Pharyngeal- Multi-consistency -- Pharyngeal --  Pharyngeal- Pill -- Pharyngeal -- Pharyngeal Comment --  CHL IP CERVICAL ESOPHAGEAL PHASE 10/03/2020 Cervical Esophageal Phase Impaired Pudding Teaspoon -- Pudding Cup -- Honey Teaspoon -- Honey Cup -- Nectar Teaspoon -- Nectar Cup -- Nectar Straw -- Thin Teaspoon -- Thin Cup -- Thin Straw -- Puree -- Mechanical Soft -- Regular -- Multi-consistency -- Pill -- Cervical Esophageal Comment (No Data) Deneise Lever, MS, CCC-SLP Speech-Language Pathologist Aliene Altes 10/03/2020, 4:40 PM                  Subjective: Feeling well.  No fever, cough, confusion, chest discomfort.  No respiraotry distress, no agitation.  No syncope or losses of consciousness  Discharge Exam: Vitals:   10/06/20 0437 10/06/20 0911  BP: 123/61 (!) 147/66  Pulse: 75 77  Resp: 18 18  Temp: 98.2 F (36.8 C) 97.7 F (36.5 C)  SpO2: 97% 98%   Vitals:   10/05/20 2020 10/06/20 0131 10/06/20 0437 10/06/20 0911  BP: (!) 141/73 (!) 112/54 123/61 (!) 147/66  Pulse: 75 68 75 77  Resp: 18 16 18 18   Temp: 98.5 F (36.9 C) 97.8 F (36.6 C) 98.2 F (36.8 C) 97.7 F (36.5 C)  TempSrc: Oral Oral Oral Oral  SpO2: 97% 97% 97% 98%  Weight:      Height:        General: Pt is alert, awake, not in acute distress, sitting up in bed Cardiovascular: RRR, nl S1-S2, no murmurs appreciated.   No LE edema.   Respiratory: Normal respiratory rate and rhythm.  CTAB without rales or wheezes. Abdominal: Abdomen soft and non-tender.  No distension or HSM.   Neuro/Psych: Strength symmetric in upper and lower extremities. But tremor noted, cogwheeling.  Voice hypophonic  Judgment and insight appear moderately impaired.   The results of significant diagnostics from this hospitalization (including imaging, microbiology, ancillary and laboratory) are listed below for reference.     Microbiology: Recent  Results (from the past 240 hour(s))  Blood culture (routine x 2)     Status: None (Preliminary result)   Collection Time: 10/02/20  5:11 PM   Specimen: BLOOD  Result Value Ref Range Status   Specimen Description BLOOD LEFT ANTECUBITAL  Final   Special Requests   Final    BOTTLES DRAWN AEROBIC AND ANAEROBIC Blood Culture adequate volume   Culture   Final    NO GROWTH 4 DAYS Performed at [facility], [address], [place], [place] [address]    Report Status PENDING  Incomplete  Resp Panel by RT-PCR (Flu A&B, Covid) Nasopharyngeal Swab     Status: None   Collection Time: 10/02/20  7:16 PM   Specimen: Nasopharyngeal Swab; Nasopharyngeal(NP) swabs in vial transport medium  Result Value Ref Range Status   SARS Coronavirus 2 by RT PCR NEGATIVE NEGATIVE Final    Comment: (NOTE) SARS-CoV-2 target nucleic acids are NOT DETECTED.  The SARS-CoV-2 RNA is generally detectable in upper respiratory specimens during the acute phase of infection. The lowest concentration of SARS-CoV-2 viral copies this assay can detect is 138 copies/mL. A negative result does not preclude SARS-Cov-2 infection and should not be used as the sole basis for treatment or other patient management decisions. A negative result may occur with  improper specimen collection/handling, submission of specimen other than nasopharyngeal swab, presence of viral mutation(s) within the areas targeted by this assay, and inadequate number of viral copies(<138 copies/mL). A negative result must be combined with clinical observations, patient history, and  epidemiological information. The expected result is Negative.  Fact Sheet for Patients:  EntrepreneurPulse.com.au  Fact Sheet for Healthcare Providers:  IncredibleEmployment.be  This test is no t yet approved or cleared by the [place] FDA and  has been authorized for detection and/or diagnosis of SARS-CoV-2 by FDA under an  Emergency Use Authorization (EUA). This EUA will remain  in effect (meaning this test can be used) for the duration of the COVID-19 declaration under Section 564(b)(1) of the Act, 21 U.S.C.section 360bbb-3(b)(1), unless the authorization is terminated  or revoked sooner.       Influenza A by PCR NEGATIVE NEGATIVE Final   Influenza B by PCR NEGATIVE NEGATIVE Final    Comment: (NOTE) The Xpert Xpress SARS-CoV-2/FLU/RSV plus assay is intended as an aid in the diagnosis of influenza from Nasopharyngeal swab specimens and should not be used as a sole basis for treatment. Nasal washings and aspirates are unacceptable for Xpert Xpress SARS-CoV-2/FLU/RSV testing.  Fact Sheet for Patients: EntrepreneurPulse.com.au  Fact Sheet for Healthcare Providers: IncredibleEmployment.be  This test is not yet approved or cleared by the [place] FDA and has been authorized for detection and/or diagnosis of SARS-CoV-2 by FDA under an Emergency Use Authorization (EUA). This EUA will remain in effect (meaning this test can be used) for the duration of the COVID-19 declaration under Section 564(b)(1) of the Act, 21 U.S.C. section 360bbb-3(b)(1), unless the authorization is terminated or revoked.  Performed at [facility], [address], [place], [place] [address]   Urine culture     Status: Abnormal   Collection Time: 10/02/20  7:16 PM   Specimen: Urine, Random  Result Value Ref Range Status   Specimen Description   Final    URINE, RANDOM Performed at [facility], [address], [place], [place] [address]    Special Requests   Final    NONE Performed at [facility], [address] [place], [place] [address]    Culture (A)  Final    <10,000 COLONIES/mL INSIGNIFICANT GROWTH Performed at [facility], [place] [address], [place], [place] [address]    Report Status 10/04/2020 FINAL  Final  Blood culture (routine x 2)      Status: None (Preliminary result)   Collection Time: 10/02/20  7:31 PM   Specimen: BLOOD  Result Value Ref Range Status   Specimen Description BLOOD BLOOD RIGHT FOREARM  Final   Special Requests   Final    BOTTLES DRAWN AEROBIC AND ANAEROBIC Blood Culture results may not be optimal due to an inadequate volume of blood received in culture bottles   Culture   Final    NO GROWTH 4 DAYS Performed at [facility], [address], [place], [place] [address]    Report Status PENDING  Incomplete  SARS CORONAVIRUS 2 (TAT 6-24 HRS) Nasopharyngeal Nasopharyngeal Swab     Status: None   Collection Time: 10/04/20  5:04 PM   Specimen: Nasopharyngeal Swab  Result Value Ref Range Status   SARS Coronavirus 2 NEGATIVE NEGATIVE Final    Comment: (NOTE) SARS-CoV-2 target nucleic acids are NOT DETECTED.  The SARS-CoV-2 RNA is generally detectable in upper and lower respiratory specimens during the acute phase of infection. Negative results do not preclude SARS-CoV-2 infection, do not rule out co-infections with other pathogens, and should not be used as the sole basis for treatment or other patient management decisions. Negative results must be combined with clinical observations, patient history, and epidemiological information. The expected  result is Negative.  Fact Sheet for Patients: SugarRoll.be  Fact Sheet for Healthcare Providers: https://www.woods-mathews.com/  This test is not yet approved or cleared by the [place] FDA and  has been authorized for detection and/or diagnosis of SARS-CoV-2 by FDA under an Emergency Use Authorization (EUA). This EUA will remain  in effect (meaning this test can be used) for the duration of the COVID-19 declaration under Se ction 564(b)(1) of the Act, 21 U.S.C. section 360bbb-3(b)(1), unless the authorization is terminated or revoked sooner.  Performed at [facility], [place] [address],  [place], [place] [address]      Labs: BNP (last 3 results) Recent Labs    01/18/20 2225  BNP 41.7   Basic Metabolic Panel: Recent Labs  Lab 10/02/20 1711 10/03/20 0429 10/04/20 0543  NA 134* 137 137  K 4.3 4.0 3.9  CL 101 107 107  CO2 27 25 25   GLUCOSE 119* 91 87  BUN 23 18 12   CREATININE 0.93 0.64 0.60*  CALCIUM 8.3* 7.8* 8.2*  MG 2.2 2.1  --   PHOS 3.0 3.1  --    Liver Function Tests: Recent Labs  Lab 10/02/20 1711 10/03/20 0429  AST 8* 9*  ALT <5 6  ALKPHOS 51 46  BILITOT 2.0* 1.3*  PROT 5.8* 5.4*  ALBUMIN 3.5 3.1*   No results for input(s): LIPASE, AMYLASE in the last 168 hours. No results for input(s): AMMONIA in the last 168 hours. CBC: Recent Labs  Lab 10/02/20 1711 10/03/20 0429 10/04/20 0543 10/05/20 0525  WBC 22.5* 7.0 6.1 4.7  NEUTROABS 20.7* 5.1  --   --   HGB 7.7* 6.7* 9.0* 8.7*  HCT 23.2* 21.0* 26.1* 26.0*  MCV 117.8* 122.8* 108.8* 111.1*  PLT 285 229 265 270   Cardiac Enzymes: Recent Labs  Lab 10/02/20 1711  CKTOTAL 24*   BNP: Invalid input(s): POCBNP CBG: No results for input(s): GLUCAP in the last 168 hours. D-Dimer No results for input(s): DDIMER in the last 72 hours. Hgb A1c No results for input(s): HGBA1C in the last 72 hours. Lipid Profile No results for input(s): CHOL, HDL, LDLCALC, TRIG, CHOLHDL, LDLDIRECT in the last 72 hours. Thyroid function studies No results for input(s): TSH, T4TOTAL, T3FREE, THYROIDAB in the last 72 hours.  Invalid input(s): FREET3 Anemia work up No results for input(s): VITAMINB12, FOLATE, FERRITIN, TIBC, IRON, RETICCTPCT in the last 72 hours. Urinalysis    Component Value Date/Time   COLORURINE YELLOW (A) 10/02/2020 1916   APPEARANCEUR CLEAR (A) 10/02/2020 1916   APPEARANCEUR Clear 08/12/2018 1412   LABSPEC 1.014 10/02/2020 1916   LABSPEC 1.027 08/13/2011 1127   PHURINE 7.0 10/02/2020 1916   GLUCOSEU NEGATIVE 10/02/2020 1916   GLUCOSEU Negative 08/13/2011 1127   HGBUR NEGATIVE  10/02/2020 1916   BILIRUBINUR NEGATIVE 10/02/2020 1916   BILIRUBINUR Negative 08/12/2018 1412   BILIRUBINUR Negative 08/13/2011 1127   KETONESUR 20 (A) 10/02/2020 1916   PROTEINUR NEGATIVE 10/02/2020 1916   UROBILINOGEN 0.2 01/21/2018 1049   NITRITE NEGATIVE 10/02/2020 1916   LEUKOCYTESUR NEGATIVE 10/02/2020 1916   LEUKOCYTESUR Negative 08/13/2011 1127   Sepsis Labs Invalid input(s): PROCALCITONIN,  WBC,  LACTICIDVEN Microbiology Recent Results (from the past 240 hour(s))  Blood culture (routine x 2)     Status: None (Preliminary result)   Collection Time: 10/02/20  5:11 PM   Specimen: BLOOD  Result Value Ref Range Status   Specimen Description BLOOD LEFT ANTECUBITAL  Final   Special Requests   Final  BOTTLES DRAWN AEROBIC AND ANAEROBIC Blood Culture adequate volume   Culture   Final    NO GROWTH 4 DAYS Performed at [facility], [address] [place], [place] [address]    Report Status PENDING  Incomplete  Resp Panel by RT-PCR (Flu A&B, Covid) Nasopharyngeal Swab     Status: None   Collection Time: 10/02/20  7:16 PM   Specimen: Nasopharyngeal Swab; Nasopharyngeal(NP) swabs in vial transport medium  Result Value Ref Range Status   SARS Coronavirus 2 by RT PCR NEGATIVE NEGATIVE Final    Comment: (NOTE) SARS-CoV-2 target nucleic acids are NOT DETECTED.  The SARS-CoV-2 RNA is generally detectable in upper respiratory specimens during the acute phase of infection. The lowest concentration of SARS-CoV-2 viral copies this assay can detect is 138 copies/mL. A negative result does not preclude SARS-Cov-2 infection and should not be used as the sole basis for treatment or other patient management decisions. A negative result may occur with  improper specimen collection/handling, submission of specimen other than nasopharyngeal swab, presence of viral mutation(s) within the areas targeted by this assay, and inadequate number of viral copies(<138 copies/mL). A negative  result must be combined with clinical observations, patient history, and epidemiological information. The expected result is Negative.  Fact Sheet for Patients:  EntrepreneurPulse.com.au  Fact Sheet for Healthcare Providers:  IncredibleEmployment.be  This test is no t yet approved or cleared by the [place] FDA and  has been authorized for detection and/or diagnosis of SARS-CoV-2 by FDA under an Emergency Use Authorization (EUA). This EUA will remain  in effect (meaning this test can be used) for the duration of the COVID-19 declaration under Section 564(b)(1) of the Act, 21 U.S.C.section 360bbb-3(b)(1), unless the authorization is terminated  or revoked sooner.       Influenza A by PCR NEGATIVE NEGATIVE Final   Influenza B by PCR NEGATIVE NEGATIVE Final    Comment: (NOTE) The Xpert Xpress SARS-CoV-2/FLU/RSV plus assay is intended as an aid in the diagnosis of influenza from Nasopharyngeal swab specimens and should not be used as a sole basis for treatment. Nasal washings and aspirates are unacceptable for Xpert Xpress SARS-CoV-2/FLU/RSV testing.  Fact Sheet for Patients: EntrepreneurPulse.com.au  Fact Sheet for Healthcare Providers: IncredibleEmployment.be  This test is not yet approved or cleared by the [place] FDA and has been authorized for detection and/or diagnosis of SARS-CoV-2 by FDA under an Emergency Use Authorization (EUA). This EUA will remain in effect (meaning this test can be used) for the duration of the COVID-19 declaration under Section 564(b)(1) of the Act, 21 U.S.C. section 360bbb-3(b)(1), unless the authorization is terminated or revoked.  Performed at [facility], [address] [place], [place] [address]   Urine culture     Status: Abnormal   Collection Time: 10/02/20  7:16 PM   Specimen: Urine, Random  Result Value Ref Range Status   Specimen  Description   Final    URINE, RANDOM Performed at [facility], [address], [place], [place] [address]    Special Requests   Final    NONE Performed at [facility], [address] [place], [place] [address]    Culture (A)  Final    <10,000 COLONIES/mL INSIGNIFICANT GROWTH Performed at [facility], [place] [address], [place], [place] [address]    Report Status 10/04/2020 FINAL  Final  Blood culture (routine x 2)     Status: None (Preliminary result)   Collection Time: 10/02/20  7:31 PM   Specimen: BLOOD  Result Value Ref Range Status   Specimen Description BLOOD BLOOD RIGHT FOREARM  Final   Special Requests   Final    BOTTLES DRAWN AEROBIC AND ANAEROBIC Blood Culture results may not be optimal due to an inadequate volume of blood received in culture bottles   Culture   Final    NO GROWTH 4 DAYS Performed at [facility], [address] [place], [place] [address]    Report Status PENDING  Incomplete  SARS CORONAVIRUS 2 (TAT 6-24 HRS) Nasopharyngeal Nasopharyngeal Swab     Status: None   Collection Time: 10/04/20  5:04 PM   Specimen: Nasopharyngeal Swab  Result Value Ref Range Status   SARS Coronavirus 2 NEGATIVE NEGATIVE Final    Comment: (NOTE) SARS-CoV-2 target nucleic acids are NOT DETECTED.  The SARS-CoV-2 RNA is generally detectable in upper and lower respiratory specimens during the acute phase of infection. Negative results do not preclude SARS-CoV-2 infection, do not rule out co-infections with other pathogens, and should not be used as the sole basis for treatment or other patient management decisions. Negative results must be combined with clinical observations, patient history, and epidemiological information. The expected result is Negative.  Fact Sheet for Patients: SugarRoll.be  Fact Sheet for Healthcare Providers: https://www.woods-mathews.com/  This test is not yet approved  or cleared by the [place] FDA and  has been authorized for detection and/or diagnosis of SARS-CoV-2 by FDA under an Emergency Use Authorization (EUA). This EUA will remain  in effect (meaning this test can be used) for the duration of the COVID-19 declaration under Se ction 564(b)(1) of the Act, 21 U.S.C. section 360bbb-3(b)(1), unless the authorization is terminated or revoked sooner.  Performed at [facility], [place] [address] [place], [place] [address]      Time coordinating discharge: 45 minutes      SIGNED:   Edwin Dada, MD  [facility] 10/06/2020, 11:13 AM

## 2020-10-06 NOTE — Progress Notes (Signed)
   10/06/20 1733  Clinical Encounter Type  Visited With Patient  Visit Type Initial;Spiritual support;Social support  Referral From Nurse  Consult/Referral To Chaplain  Visited Pt in his room. Pt said he was alright at this time spiritually. I let him know Ch services are available 24 hours and if they need [place] let us know.

## 2020-10-07 ENCOUNTER — Telehealth: Payer: Self-pay

## 2020-10-07 LAB — CULTURE, BLOOD (ROUTINE X 2)
Culture: NO GROWTH
Culture: NO GROWTH
Special Requests: ADEQUATE

## 2020-10-07 NOTE — Telephone Encounter (Signed)
Transition Care Management Follow-up Telephone Call    Date discharged?10/06/2020  How have you been since you were released from the hospital? Doing good, better  Any patient concerns? c/o of nervousness  Items Reviewed:  Medications reviewed: yes  Allergies reviewed: PCN, celebrex, demerol, amoxicillin  Dietary changes reviewed:no  Referrals reviewed: no   Functional Questionnaire:  Independent - Independent   Dependent - D    Activities of Daily Living (ADLs):     Personal hygiene - yes  Dressing -yes  Eating - yes Maintaining continence - yes Transferring -yes  Independent Activities of Daily Living (iADLs): Basic communication skills - yes Transportation - yes Meal preparation  -yes Shopping no Housework - no Managing medications - yes Managing personal finances - no               Confirmed importance and date/time of follow-up visits scheduled Wednesday 10/09/2020   Provider Appointment booked with Dr. Wells Guiles Tat @ 10:15am  Confirmed with patient if condition begins to worsen call PCP or go to the ER.  Patient was given the office number and encouraged to call back with question or concerns: Yes

## 2020-10-07 NOTE — Progress Notes (Signed)
TRANSITION CARE MANAGEMENT  Assessment/Plan:   1.  Parkinsons Disease  -Status post left STN DBS at [facility] in August, 2014.  Battery last changed in September, 2018.    Unfortunately, battery is near end-of-life now and needs changed.  We will send a referral to Dr. Vertell Limber.   -Daughter requests changing traditional levodopa to [place] because of swallowing issues.  We will do that.  Take Parcopa 25/100, 2 tablets at 7:30 AM/1 tablet at 10 AM/1 tablet at 1 PM/1 tablet at 4 PM/1 tablet at 7 PM. Can take an extra if needed but not daily.  Told him no more than 7 tabls per day.  -Patient should not be messing with his DBS.  Turned off the ability to have patient controlling it due to patient not understanding how to use the device properly.  He has called since last visit thinking that it failed (he just ran out of the batteries and the patient controller).  He does have the ability to turn off and on the device if needed. 2.PDD, advancing -Patient should not drive.  Meds should be monitored and directly administered to him.  Patient needs 24 hour/day care.  This should be in addition to his wife, as she has baseline memory issues as well.  Because of this, discussed living situation in detail.  Discussed that while he is currently in [facility] [place], I think that he should transition to full-time care at [facility].  He will have all services available to him there, including nursing care and physical therapies.  If he does not want to do that, then I discussed with patient and wife and daughter that he will need 24 hour/day care at home (and discussed with him that sending the caregivers away would not be an option).  Patient, wife, daughters are going to have a meeting at [facility] in this regard.  I asked them to involve social work with this meeting and they are going to set this up in a few days. 3. Myelodysplastic syndrome  -Following with  hematology/oncology 4.  Dysphagia, associated with Parkinson's disease  -Modified barium swallow while in the hospital in March, 2022, with evidence of moderate to severe aspiration risk.  Discussed with patient and family that his level of care/advanced directives really do not line up with his diet.  Patient wants to eat for quality of life, but is a large risk for aspiration.  Certainly, one cannot argue that diet contributes to quality of life.  However, he continues to want to be full code.  Expressed to the patient that it is highly likely he is going to end up on a ventilator, and it is also highly likely that it will be very difficult to get him off of that.  They asked a lot of questions about this.  It is possible that we could repeat his swallow study, but would like to get a little farther away from this hospitalization before we do this.  Daughters are going to have another conversation with patient with Education officer, museum about this at [facility].  Discussed [employer] form and social worker at [facility] should be able to help them fill this out. 5.  Sialorrhea  -This is commonly associated with PD.  We talked about treatments.  The patient is not a candidate for oral anticholinergic therapy because of increased risk of confusion and falls.  Patient would like to  try Botox.  We will try to get Myobloc approval.  Subjective:   Casey Tucker was seen today in follow up for Parkinsons disease.  My previous records were reviewed prior to todays visit as well as outside records available to me.  Patient's daughter on the telephone and participated in the conversation and supplemented the history. Wife in room with patient as well.   Pt worked in post hospitalization.  Hospitalization actually did not have much to do with Parkinson's disease.  He was found down and unresponsive.  History was somewhat unknown, as wife has baseline memory issues as well.  Hospitalist did reach out to me and we  discussed his care.  Patient was found to have pneumonia in the setting of myelodysplastic syndrome and was treated with Rocephin and azithromycin.  While in the hospital, the patient was sitting up and eating and had an aspiration event, resulting in a code being called.  By the time the emergency room physician got there, the patient had come back to baseline.  Speech assessed the patient and felt that he was an aspiration risk and was aspirating all consistencies of food and liquid.  Ultimately, speech recommended pured diet with thin liquids only if patient/family are choosing a diet for comfort and quality of life.  Patient is still full code.  pt asks about botox for drooling.  Pt asks about wearing off.  Daughter thinks that since he is just at [facility], may want to wait just because medications are being given on a more regular basis now.  They also ask about parcopa b/c of swallow issues.  Current prescribed movement disorder medications: Carbidopa/levodopa 25/100, 2 tablets at 7:30 AM/1 tablet at 10 AM/1 tablet at 1 PM/1 tablet at 4 PM/1 tablet at 7 PM(states that he often takes 7 per day) - he knew this dosing schedule without prompting  PREVIOUS MEDICATIONS: entacapone (d/c last visit d/t confusion)  ALLERGIES:   Allergies  Allergen Reactions  . Amoxicillin   . Celebrex [Celecoxib]   . Demerol Other (See Comments)    Hallucinations   . Penicillins     REACTION: RASH    CURRENT MEDICATIONS:  Outpatient Encounter Medications as of 10/09/2020  Medication Sig  . carbidopa-levodopa (PARCOPA) 25-100 MG disintegrating tablet Take 1 tablet by mouth 3 (three) times daily.  . carbidopa-levodopa (PARCOPA) 25-100 MG disintegrating tablet TAKE 2 AT 7:00, 1 AT 10AM,1 AT 1PM, 1 AT 4PM AND 1 AT 7PM  . carbidopa-levodopa (SINEMET IR) 25-100 MG tablet Take 1 tablet by mouth daily as needed (Bradykinesia).  . feeding supplement (ENSURE ENLIVE / ENSURE PLUS) LIQD Take 237 mLs by mouth 3 (three)  times daily between meals.  . gabapentin (NEURONTIN) 100 MG capsule TAKE ONE CAPSULE THREE TIMES A DAY AND TWO CAPSULES AT BEDTIME.  . magnesium hydroxide (MILK OF MAGNESIA) 400 MG/5ML suspension Take 15 mLs by mouth daily as needed for mild constipation.  . [DISCONTINUED] carbidopa-levodopa (SINEMET IR) 25-100 MG tablet TAKE 2 AT 7:00, 1 AT 10AM,1 AT 1PM, 1 AT 4PM AND 1 AT 7PM  . [EXPIRED] cefdinir (OMNICEF) 300 MG capsule Take 1 capsule (300 mg total) by mouth every 12 (twelve) hours for 1 day.  . [DISCONTINUED] docusate sodium (COLACE) 100 MG capsule Take 1 capsule (100 mg total) by mouth every other day. (Patient not taking: Reported on 10/09/2020)  . [DISCONTINUED] ferrous sulfate 325 (65 FE) MG tablet Take 1 tablet (325 mg total) by mouth every other day. (Patient not  taking: Reported on 10/09/2020)   No facility-administered encounter medications on file as of 10/09/2020.    Objective:   PHYSICAL EXAMINATION:    VITALS:   Vitals:   10/09/20 1015  BP: (!) 102/50  Pulse: 66  SpO2: 99%  Weight: 148 lb (67.1 kg)  Height: 6' (1.829 m)    GEN:  The patient appears stated age and is in NAD. HEENT:  Normocephalic, atraumatic.  The mucous membranes are moist. The superficial temporal arteries are without ropiness or tenderness.  Neurological examination:  Orientation: The patient is alert and oriented to person/place. Cranial nerves: There is good facial symmetry with facial hypomimia. The speech is fluent and dysarthric. Soft palate rises symmetrically and there is no tongue deviation. Hearing is intact to conversational tone. Sensation: Sensation is intact to light touch throughout Motor: Strength is at least antigravity x4.  Movement examination: Tone: There is normal tone in the upper and lower extremities. Abnormal movements: none Coordination:  There is decremation with RAM's, with any form of RAMS, including alternating supination and pronation of the forearm, hand opening and  closing, finger taps, heel taps and toe taps bilaterally Gait and Station: Not tested. I have reviewed and interpreted the following labs independently    Chemistry      Component Value Date/Time   NA 137 10/04/2020 0543   NA 142 08/11/2016 1701   NA 139 08/15/2011 0535   K 3.9 10/04/2020 0543   K 3.8 08/15/2011 0535   CL 107 10/04/2020 0543   CL 101 08/15/2011 0535   CO2 25 10/04/2020 0543   CO2 29 08/15/2011 0535   BUN 12 10/04/2020 0543   BUN 18 08/11/2016 1701   BUN 12 08/15/2011 0535   CREATININE 0.60 (L) 10/04/2020 0543   CREATININE 0.73 08/15/2011 0535      Component Value Date/Time   CALCIUM 8.2 (L) 10/04/2020 0543   CALCIUM 7.6 (L) 08/15/2011 0535   ALKPHOS 46 10/03/2020 0429   AST 9 (L) 10/03/2020 0429   ALT 6 10/03/2020 0429   BILITOT 1.3 (H) 10/03/2020 0429   BILITOT 1.2 08/11/2016 1701       Lab Results  Component Value Date   WBC 4.7 10/05/2020   HGB 8.7 (L) 10/05/2020   HCT 26.0 (L) 10/05/2020   MCV 111.1 (H) 10/05/2020   PLT 270 10/05/2020    Lab Results  Component Value Date   TSH 4.029 10/03/2020     Total time spent on today's visit was 65 minutes, including both face-to-face time and nonface-to-face time.  Time included that spent on review of records (prior notes available to me/labs/imaging if pertinent), discussing treatment and goals, answering patient's questions and coordinating care.  This did not include DBS time.  Cc:  [name], MD

## 2020-10-08 DIAGNOSIS — G20 Parkinson's disease: Secondary | ICD-10-CM | POA: Diagnosis not present

## 2020-10-08 DIAGNOSIS — D469 Myelodysplastic syndrome, unspecified: Secondary | ICD-10-CM | POA: Diagnosis not present

## 2020-10-08 DIAGNOSIS — M48062 Spinal stenosis, lumbar region with neurogenic claudication: Secondary | ICD-10-CM | POA: Diagnosis not present

## 2020-10-08 DIAGNOSIS — G3183 Dementia with Lewy bodies: Secondary | ICD-10-CM

## 2020-10-08 DIAGNOSIS — R55 Syncope and collapse: Secondary | ICD-10-CM | POA: Diagnosis not present

## 2020-10-08 DIAGNOSIS — E440 Moderate protein-calorie malnutrition: Secondary | ICD-10-CM | POA: Diagnosis not present

## 2020-10-09 ENCOUNTER — Ambulatory Visit (INDEPENDENT_AMBULATORY_CARE_PROVIDER_SITE_OTHER): Payer: Medicare Other | Admitting: Neurology

## 2020-10-09 ENCOUNTER — Other Ambulatory Visit: Payer: Self-pay

## 2020-10-09 ENCOUNTER — Encounter: Payer: Self-pay | Admitting: Neurology

## 2020-10-09 VITALS — BP 102/50 | HR 66 | Ht 72.0 in | Wt 148.0 lb

## 2020-10-09 DIAGNOSIS — G20 Parkinson's disease: Secondary | ICD-10-CM

## 2020-10-09 DIAGNOSIS — Z4542 Encounter for adjustment and management of neuropacemaker (brain) (peripheral nerve) (spinal cord): Secondary | ICD-10-CM | POA: Diagnosis not present

## 2020-10-09 MED ORDER — CARBIDOPA-LEVODOPA 25-100 MG PO TBDP: ORAL_TABLET | ORAL | 1 refills | Status: DC

## 2020-10-09 NOTE — Patient Instructions (Signed)
After  Visit Summary:   Medication Changes:  Your physician recommends that you continue on your current medications as directed. Please refer to the Current Medication list given to you today. Please contact your pharmacy if you need medication refills on your medications prescribed by Dr Tat.   Referral:  You have been referred to Dr Vertell Limber with [facility]   Follow up:  Your physician recommends that you schedule a follow-up appointment in: 6 months

## 2020-10-09 NOTE — Procedures (Signed)
DBS Programming was performed.    Manufacturer of DBS device: [employer]  Total time spent programming was 15 minutes.  Device was on.  Soft start was confirmed to be on.  Impedences were checked and were within normal limits.  Battery was checked and was determined to be functioning normally and not near the end of life.  Final settings were as follows:   Active Contact Amplitude (mA) PW (ms) Frequency (hz) Side Effects Battery  Left Brain        01/29/20        Group A (active) 2-C+ 3.7 60 135  2.88  Group B (not active) 1-C+ 3.2 90 140    08/02/20 2-C+ 3.9 60 140  2.76  10/09/20 2-C+ 3.9 60 140  2.62                                  Right Brain        n/a

## 2020-10-10 ENCOUNTER — Telehealth: Payer: Self-pay | Admitting: *Deleted

## 2020-10-10 NOTE — Telephone Encounter (Signed)
Spoke to Casey Tucker. She will get with Dr tat and then communicate through [facility]

## 2020-10-10 NOTE — Telephone Encounter (Signed)
Jennefer Bravo (daughter) left VM at Triage. She wanted to update PCP on pt and also ask a question.  [name] said that pt is at [facility] for PT but they meet with a Education officer, museum today and pt has decided to stay at [facility] long term. Pt also discussed being DNR with Education officer, museum as well. Edrick Kins also asked a question. Pt is doing PT now (kickboxing) on Tuesday and Thursday's. Daughter said it takes a lot out of him and is asking if pt can get an extra carbidopa-levodopa after PT on those days?

## 2020-10-10 NOTE — Telephone Encounter (Signed)
Let her know that it is best for her to pose questions there at [facility] since it is a completely different system. I might want this question to go to Dr Tat though----more sinemet is not always better but I will order it if she thinks it is okay (I am not sure that an extra pill will help if he is just fatigued)

## 2020-10-14 ENCOUNTER — Encounter: Payer: Self-pay | Admitting: Neurology

## 2020-10-14 DIAGNOSIS — G609 Hereditary and idiopathic neuropathy, unspecified: Secondary | ICD-10-CM | POA: Diagnosis not present

## 2020-10-14 NOTE — Progress Notes (Signed)
Received BV from [facility]- patient's Myobloc is covered through buy and bill only. Sent BV to scanning for chart.   3/28- no vm opt to let patient know to schedule appt. Will try again tomorrow.

## 2020-10-15 ENCOUNTER — Inpatient Hospital Stay: Payer: Medicare Other

## 2020-10-15 ENCOUNTER — Telehealth: Payer: Self-pay | Admitting: Neurology

## 2020-10-15 ENCOUNTER — Encounter: Payer: Self-pay | Admitting: Oncology

## 2020-10-15 ENCOUNTER — Inpatient Hospital Stay: Payer: Medicare Other | Attending: Oncology | Admitting: Oncology

## 2020-10-15 DIAGNOSIS — G20 Parkinson's disease: Secondary | ICD-10-CM | POA: Diagnosis not present

## 2020-10-15 DIAGNOSIS — E538 Deficiency of other specified B group vitamins: Secondary | ICD-10-CM | POA: Insufficient documentation

## 2020-10-15 DIAGNOSIS — D469 Myelodysplastic syndrome, unspecified: Secondary | ICD-10-CM | POA: Insufficient documentation

## 2020-10-15 DIAGNOSIS — Z79899 Other long term (current) drug therapy: Secondary | ICD-10-CM | POA: Insufficient documentation

## 2020-10-15 LAB — CBC WITH DIFFERENTIAL/PLATELET
Abs Immature Granulocytes: 0.03 10*3/uL (ref 0.00–0.07)
Basophils Absolute: 0 10*3/uL (ref 0.0–0.1)
Basophils Relative: 1 %
Eosinophils Absolute: 0.2 10*3/uL (ref 0.0–0.5)
Eosinophils Relative: 3 %
HCT: 30.1 % — ABNORMAL LOW (ref 39.0–52.0)
Hemoglobin: 10.1 g/dL — ABNORMAL LOW (ref 13.0–17.0)
Immature Granulocytes: 1 %
Lymphocytes Relative: 18 %
Lymphs Abs: 0.8 10*3/uL (ref 0.7–4.0)
MCH: 37.1 pg — ABNORMAL HIGH (ref 26.0–34.0)
MCHC: 33.6 g/dL (ref 30.0–36.0)
MCV: 110.7 fL — ABNORMAL HIGH (ref 80.0–100.0)
Monocytes Absolute: 0.6 10*3/uL (ref 0.1–1.0)
Monocytes Relative: 14 %
Neutro Abs: 2.9 10*3/uL (ref 1.7–7.7)
Neutrophils Relative %: 63 %
Platelets: 326 10*3/uL (ref 150–400)
RBC: 2.72 MIL/uL — ABNORMAL LOW (ref 4.22–5.81)
RDW: 17.1 % — ABNORMAL HIGH (ref 11.5–15.5)
WBC: 4.5 10*3/uL (ref 4.0–10.5)
nRBC: 0 % (ref 0.0–0.2)

## 2020-10-15 LAB — RETIC PANEL
Immature Retic Fract: 12.5 % (ref 2.3–15.9)
RBC.: 2.73 MIL/uL — ABNORMAL LOW (ref 4.22–5.81)
Retic Count, Absolute: 47 10*3/uL (ref 19.0–186.0)
Retic Ct Pct: 1.7 % (ref 0.4–3.1)
Reticulocyte Hemoglobin: 42 pg (ref >27.9)

## 2020-10-15 LAB — VITAMIN B12: Vitamin B-12: 934 pg/mL — ABNORMAL HIGH (ref 180–914)

## 2020-10-15 NOTE — Progress Notes (Signed)
Hematology/Oncology  Follow up note [name] [phone] [phone]   Patient Care Team: Casey Carbon, MD as PCP - General (Pediatrics) Tat, Casey Quail, DO as Consulting Physician (Neurology)  REFERRING PROVIDER: [name], MD  CHIEF COMPLAINTS/REASON FOR VISIT:  Follow up  anemia and leukopenia  HISTORY OF PRESENTING ILLNESS:   Casey Tucker is a  80 y.o.  male with PMH listed below was seen in consultation at the request of  Casey Tucker I, MD  for evaluation of anemia and leukopenia  Patient was accompanied by wife today.  Patient's daughter Casey Tucker was called per patient's request. 02/08/2020, patient had a CBC showed WBC 2.6, hemoglobin 8.5, MCV 119, platelet count 245. 02/07/2020, hemoglobin 7.6, MCV 121, differential showed neutropenia with ANC 0.9.  Patient reports 20 pound loss in the past 6 to 12 months.  Fatigue and weakness.  Appetite is fair. Wife reports that patient takes naps during the day.  No fever, chills, night sweating. Chronic history of Parkinson's disease on Carbidopa-levodopa, gabapentin for chronic pain. Patient drinks wine occasionally.  Denies smoking history.   INTERVAL HISTORY Casey Tucker is a 80 y.o. male who has above history reviewed by me today presents for follow up visit for management of MDS Problems and complaints are listed below: Recent hospitalization due to pneumonia, unresponsiveness.  Patient was found to have decreased hemoglobin 6.7 admission.  Was transfused with 1 unit of PRBC and hemoglobin improved to 9.  Smear was consistent with MDS, no blast.  Patient was discharged in today he presents for follow-up. Patient was accompanied by a friend.  He has Parkinson's disease,   .  Review of Systems  Constitutional: Negative for chills, fatigue, fever and unexpected weight change.  HENT:   Negative for hearing loss and voice change.   Eyes: Negative for eye problems and  icterus.  Respiratory: Negative for chest tightness, cough and shortness of breath.   Cardiovascular: Negative for chest pain and leg swelling.  Gastrointestinal: Negative for abdominal distention and abdominal pain.  Endocrine: Negative for hot flashes.  Genitourinary: Negative for difficulty urinating, dysuria and frequency.   Musculoskeletal: Negative for arthralgias.  Skin: Negative for itching and rash.  Neurological: Negative for light-headedness and numbness.  Hematological: Negative for adenopathy. Does not bruise/bleed easily.  Psychiatric/Behavioral: Negative for confusion.    MEDICAL HISTORY:  Past Medical History:  Diagnosis Date  . Arthritis    left hip   . GERD (gastroesophageal reflux disease) Pre 2002   Mild  . History of kidney stones   . 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 Parkinson disease ([place]) 2004   Slowly progressive  . Ruptured appendix teens    SURGICAL HISTORY: Past Surgical History:  Procedure Laterality Date  . APPENDECTOMY    . CATARACT EXTRACTION W/ INTRAOCULAR LENS  IMPLANT, BILATERAL  2017  . DEEP BRAIN STIMULATOR PLACEMENT  8/14   L STN  . DOPPLER ECHOCARDIOGRAPHY  01/10/2009   LV NML Mild LVH EF 60-65% aortic sclerosis w/0 stenosis   . JOINT REPLACEMENT     left hip replacement 08/14/11/[place]   . SUBTHALAMIC STIMULATOR BATTERY REPLACEMENT Left 03/25/2017   Procedure: Deep Brain Stimulator battery replacement;  Surgeon: Erline Levine, MD;  Location: [place];  Service: Neurosurgery;  Laterality: Left;  left  . TONSILLECTOMY    . TOTAL HIP REVISION  08/27/2011   Procedure: TOTAL HIP REVISION;  Surgeon: Mauri Pole, MD;  Location: [facility];  Service: Orthopedics;  Laterality: Left;    SOCIAL HISTORY: Social History   Socioeconomic History  . Marital status: Married    Spouse name: Not on file  . Number of children: 2  . Years of education: Not on file  .  Highest education level: Doctorate  Occupational History  . Occupation: Optometrist    Comment: medically retired  Tobacco Use  . Smoking status: Never Smoker  . Smokeless tobacco: Never Used  Vaping Use  . Vaping Use: Never used  Substance and Sexual Activity  . Alcohol use: Not Currently    Alcohol/week: 7.0 standard drinks    Types: 7 Standard drinks or equivalent per week    Comment: occassionally  . Drug use: No  . Sexual activity: Yes  Other Topics Concern  . Not on file  Social History Narrative   Married, lives with wife   2 daughters   Right handed   Has living Casey   Wife has health care POA---then daughters   Would still accept CPR--but no prolonged artificial means (ventilator or tube feeds)   Social Determinants of Health   Financial Resource Strain: Not on file  Food Insecurity: Not on file  Transportation Needs: Not on file  Physical Activity: Not on file  Stress: Not on file  Social Connections: Not on file  Intimate Partner Violence: Not on file    FAMILY HISTORY: Family History  Problem Relation Age of Onset  . Arthritis Mother        knee replacement  . COPD Father        emphysema, smoker  . Heart disease Father        CHF  . Healthy Sister   . Alcohol abuse Paternal Uncle   . Rheum arthritis Daughter     ALLERGIES:  is allergic to amoxicillin, celebrex [celecoxib], demerol, and penicillins.  MEDICATIONS:  Current Outpatient Medications  Medication Sig Dispense Refill  . acetaminophen (TYLENOL) 325 MG tablet Take 650 mg by mouth every 6 (six) hours as needed.    . carbidopa-levodopa (PARCOPA) 25-100 MG disintegrating tablet Take 2 tablets by mouth daily.    . carbidopa-levodopa (PARCOPA) 25-100 MG disintegrating tablet TAKE 2 AT 7:00, 1 AT 10AM,1 AT 1PM, 1 AT 4PM AND 1 AT 7PM 540 tablet 1  . carbidopa-levodopa (SINEMET IR) 25-100 MG tablet Take 1 tablet by mouth daily as needed (Bradykinesia).    . feeding supplement (ENSURE ENLIVE /  ENSURE PLUS) LIQD Take 237 mLs by mouth 3 (three) times daily between meals. 237 mL 12  . ferrous sulfate 325 (65 FE) MG tablet Take 325 mg by mouth every other day.    . gabapentin (NEURONTIN) 100 MG capsule TAKE ONE CAPSULE THREE TIMES A DAY AND TWO CAPSULES AT BEDTIME. 150 capsule 5  . magnesium hydroxide (MILK OF MAGNESIA) 400 MG/5ML suspension Take 15 mLs by mouth daily as needed for mild constipation. 355 mL 0  . sennosides-docusate sodium (SENOKOT-S) 8.6-50 MG tablet Take 2 tablets by mouth daily.     No current facility-administered medications for this visit.     PHYSICAL EXAMINATION: ECOG PERFORMANCE STATUS: 2 - Symptomatic, <50% confined to bed There were no vitals filed for this visit. There were no vitals filed for this visit.  Physical Exam Constitutional:      General: He is not in acute distress.    Comments: Patient sits in a wheelchair today.  Thin built  HENT:  Head: Normocephalic and atraumatic.  Eyes:     General: No scleral icterus. Cardiovascular:     Rate and Rhythm: Normal rate and regular rhythm.     Heart sounds: Normal heart sounds.  Pulmonary:     Effort: Pulmonary effort is normal. No respiratory distress.     Breath sounds: No wheezing.  Abdominal:     General: Bowel sounds are normal. There is no distension.     Palpations: Abdomen is soft.  Musculoskeletal:        General: No deformity. Normal range of motion.     Cervical back: Normal range of motion and neck supple.  Skin:    General: Skin is warm and dry.     Findings: No erythema or rash.  Neurological:     Mental Status: He is alert and oriented to person, place, and time. Mental status is at baseline.     Cranial Nerves: No cranial nerve deficit.     Coordination: Coordination normal.  Psychiatric:        Mood and Affect: Mood normal.     LABORATORY DATA:  I have reviewed the data as listed Lab Results  Component Value Date   WBC 4.5 10/15/2020   HGB 10.1 (L) 10/15/2020    HCT 30.1 (L) 10/15/2020   MCV 110.7 (H) 10/15/2020   PLT 326 10/15/2020   Recent Labs    02/07/20 1209 02/08/20 0429 02/21/20 1204 03/01/20 1142 10/02/20 1711 10/03/20 0429 10/04/20 0543  NA 138 139 137  --  134* 137 137  K 3.7 3.5 4.3  --  4.3 4.0 3.9  CL 105 107 102  --  101 107 107  CO2 _0 --  _1 GLUCOSE 109* 98 101*  --  119* 91 87  BUN _2 --  _3 CREATININE 0.65 0.68 0.73  --  0.93 0.64 0.60*  CALCIUM 7.6* 8.1* 8.5*  --  8.3* 7.8* 8.2*  GFRNONAA >60 >60 >60  --  >60 >60 >60  GFRAA >60 >60 >60  --   --   --   --   PROT  --   --  6.7  --  5.8* 5.4*  --   ALBUMIN  --   --  3.9  --  3.5 3.1*  --   AST  --   --  14*  --  8* 9*  --   ALT  --   --  5  --  <5 6  --   ALKPHOS  --   --  64  --  51 46  --   BILITOT  --   --  2.0*  --  2.0* 1.3*  --   BILIDIR  --   --   --  0.2  --   --   --    Iron/TIBC/Ferritin/ %Sat    Component Value Date/Time   IRON 17 (L) 10/02/2020 1916   TIBC 244 (L) 10/02/2020 1916   FERRITIN 280 10/02/2020 1916   IRONPCTSAT 7 (L) 10/02/2020 1916      RADIOGRAPHIC STUDIES: I have personally reviewed the radiological images as listed and agreed with the findings in the report. DG Chest 2 View  Result Date: 10/02/2020 CLINICAL DATA:  Syncope. EXAM: CHEST - 2 VIEW COMPARISON:  January 18, 2020. FINDINGS: The heart size and mediastinal contours are within normal limits. Both lungs are clear. The visualized skeletal structures are unremarkable. IMPRESSION: No active  cardiopulmonary disease. Electronically Signed   By: Marijo Conception M.D.   On: 10/02/2020 18:10   DG Abd 1 View  Result Date: 10/02/2020 CLINICAL DATA:  Constipation. EXAM: ABDOMEN - 1 VIEW COMPARISON:  None. FINDINGS: No bowel dilatation to suggest obstruction. Small to moderate colonic stool burden. No abnormal rectal distention. Small calcifications project in the right abdomen at the level L3-L4 level. Telemetry leads overlie the abdomen in this region. Left hip  arthroplasty. Multilevel degenerative change in the spine. There are vascular calcifications. IMPRESSION: 1. Nonobstructive bowel gas pattern. Small to moderate colonic stool burden. 2. Small calcifications in the right abdomen at the level of L3-L4, indeterminate. These may represent stones in the lower right kidney, gallstones, or overlying artifact. Electronically Signed   By: Keith Rake M.D.   On: 10/02/2020 19:40   CT Head Wo Contrast  Result Date: 10/02/2020 CLINICAL DATA:  Dizziness, nonspecific. Neck trauma. Additional history provided: Unresponsive at home, weakness for several days, history of Graves and Parkinson's. EXAM: CT HEAD WITHOUT CONTRAST CT CERVICAL SPINE WITHOUT CONTRAST TECHNIQUE: Multidetector CT imaging of the head and cervical spine was performed following the standard protocol without intravenous contrast. Multiplanar CT image reconstructions of the cervical spine were also generated. COMPARISON:  Head CT 02/07/2020.  Brain MRI 03/06/2015. FINDINGS: CT HEAD FINDINGS Brain: Unchanged position of a left frontal approach deep brain stimulator which terminates in the left subthalamic region. There is no acute intracranial hemorrhage. No demarcated cortical infarct. No extra-axial fluid collection. No evidence of intracranial mass. No midline shift. Vascular: No hyperdense vessel.  Atherosclerotic calcifications. Skull: Left frontoparietal burr hole at site of DBS lead entry. No calvarial fracture or focal suspicious osseous lesion. Sinuses/Orbits: Visualized orbits show no acute finding. No significant paranasal sinus disease at the imaged levels. CT CERVICAL SPINE FINDINGS Alignment: Straightening of the expected cervical lordosis. 2 mm C4-C5 grade 1 anterolisthesis. Skull base and vertebrae: The basion-dental and atlanto-dental intervals are maintained.No evidence of acute fracture to the cervical spine. Soft tissues and spinal canal: No prevertebral fluid or swelling. No visible  canal hematoma. Disc levels: Cervical spondylosis. Most notably at C5-C6, there is moderate to moderately advanced disc space narrowing with a disc bulge, endplate spurring and uncovertebral hypertrophy. Bilateral bony neural foraminal narrowing and apparent mild spinal canal stenosis at this level. Upper chest: No consolidation within the imaged lung apices. No visible pneumothorax. Other: A deep brain stimulator lead traverses the left neck. IMPRESSION: CT head: 1. No evidence of acute intracranial abnormality. 2. Unchanged position of a left frontal approach deep brain stimulator lead. CT cervical spine: 1. No evidence of acute fracture to the cervical spine. 2. 2 mm C4-C5 grade 1 anterolisthesis. 3. Cervical spondylosis, greatest at C5-C6. Electronically Signed   By: Kellie Simmering DO   On: 10/02/2020 17:56   CT Cervical Spine Wo Contrast  Result Date: 10/02/2020 CLINICAL DATA:  Dizziness, nonspecific. Neck trauma. Additional history provided: Unresponsive at home, weakness for several days, history of Graves and Parkinson's. EXAM: CT HEAD WITHOUT CONTRAST CT CERVICAL SPINE WITHOUT CONTRAST TECHNIQUE: Multidetector CT imaging of the head and cervical spine was performed following the standard protocol without intravenous contrast. Multiplanar CT image reconstructions of the cervical spine were also generated. COMPARISON:  Head CT 02/07/2020.  Brain MRI 03/06/2015. FINDINGS: CT HEAD FINDINGS Brain: Unchanged position of a left frontal approach deep brain stimulator which terminates in the left subthalamic region. There is no acute intracranial hemorrhage. No demarcated cortical infarct. No extra-axial  fluid collection. No evidence of intracranial mass. No midline shift. Vascular: No hyperdense vessel.  Atherosclerotic calcifications. Skull: Left frontoparietal burr hole at site of DBS lead entry. No calvarial fracture or focal suspicious osseous lesion. Sinuses/Orbits: Visualized orbits show no acute finding.  No significant paranasal sinus disease at the imaged levels. CT CERVICAL SPINE FINDINGS Alignment: Straightening of the expected cervical lordosis. 2 mm C4-C5 grade 1 anterolisthesis. Skull base and vertebrae: The basion-dental and atlanto-dental intervals are maintained.No evidence of acute fracture to the cervical spine. Soft tissues and spinal canal: No prevertebral fluid or swelling. No visible canal hematoma. Disc levels: Cervical spondylosis. Most notably at C5-C6, there is moderate to moderately advanced disc space narrowing with a disc bulge, endplate spurring and uncovertebral hypertrophy. Bilateral bony neural foraminal narrowing and apparent mild spinal canal stenosis at this level. Upper chest: No consolidation within the imaged lung apices. No visible pneumothorax. Other: A deep brain stimulator lead traverses the left neck. IMPRESSION: CT head: 1. No evidence of acute intracranial abnormality. 2. Unchanged position of a left frontal approach deep brain stimulator lead. CT cervical spine: 1. No evidence of acute fracture to the cervical spine. 2. 2 mm C4-C5 grade 1 anterolisthesis. 3. Cervical spondylosis, greatest at C5-C6. Electronically Signed   By: Kellie Simmering DO   On: 10/02/2020 17:56   CT ABDOMEN PELVIS W CONTRAST  Result Date: 10/02/2020 CLINICAL DATA:  Cholelithiasis, constipation EXAM: CT ABDOMEN AND PELVIS WITH CONTRAST TECHNIQUE: Multidetector CT imaging of the abdomen and pelvis was performed using the standard protocol following bolus administration of intravenous contrast. CONTRAST:  158m OMNIPAQUE IOHEXOL 300 MG/ML  SOLN COMPARISON:  02/02/2005, 10/02/2020 FINDINGS: Lower chest: Ground-glass airspace disease is seen throughout the visualized right lung base. Bibasilar areas of subpleural scarring are noted. No effusion or pneumothorax. Hepatobiliary: No focal liver abnormality is seen. No gallstones, gallbladder wall thickening, or biliary dilatation. Pancreas: Unremarkable. No  pancreatic ductal dilatation or surrounding inflammatory changes. Spleen: Normal in size without focal abnormality. Adrenals/Urinary Tract: There are multiple nonobstructing less than 4 mm calculi within the lower pole right kidney. Additionally, there is a 5 mm partially obstructing proximal right ureteral calculus, with mild right-sided obstructive uropathy. Normal excretion of contrast on delayed imaging. There are bilateral peripelvic renal cysts. The kidneys enhance normally and symmetrically. The adrenals and bladder are unremarkable. Stomach/Bowel: No bowel obstruction or ileus. Moderate retained stool throughout the colon. The appendix is surgically absent. No bowel wall thickening or inflammatory change. Vascular/Lymphatic: Aortic atherosclerosis. No enlarged abdominal or pelvic lymph nodes. Reproductive: Prostate is unremarkable. Other: No free fluid or free gas.  No abdominal wall hernia. Musculoskeletal: No acute or destructive bony lesions. Unremarkable left hip arthroplasty. Reconstructed images demonstrate no additional findings. IMPRESSION: 1. 5 mm proximal right ureteral calculus, with mild right-sided hydronephrosis. The calculus is nonobstructing, as normal excretion of contrast can be seen traversing the calculus on delayed imaging. 2. Other nonobstructing less than 4 mm right renal calculi, corresponding to the calcifications seen on previous x-ray. 3. Ground-glass airspace disease throughout the right lung base. Findings could reflect infection, inflammation, or aspiration. Asymmetric edema is thought to be unlikely. 4. Aortic Atherosclerosis (ICD10-I70.0) and Emphysema (ICD10-J43.9). Electronically Signed   By: MRanda NgoM.D.   On: 10/02/2020 23:13   UKoreaCarotid Bilateral  Result Date: 10/02/2020 CLINICAL DATA:  Syncope, dizziness, weakness for several days EXAM: BILATERAL CAROTID DUPLEX ULTRASOUND TECHNIQUE: GPearline Cablesscale imaging, color Doppler and duplex ultrasound were performed of  bilateral carotid and vertebral  arteries in the neck. COMPARISON:  None. FINDINGS: Criteria: Quantification of carotid stenosis is based on velocity parameters that correlate the residual internal carotid diameter with NASCET-based stenosis levels, using the diameter of the distal internal carotid lumen as the denominator for stenosis measurement. The following velocity measurements were obtained: RIGHT ICA: 79/12 cm/sec CCA: 789/38 cm/sec SYSTOLIC ICA/CCA RATIO:  0.6 ECA: 182 cm/sec LEFT ICA: 85/17 cm/sec CCA: 101/75 cm/sec SYSTOLIC ICA/CCA RATIO:  0.8 ECA: 154 cm/sec RIGHT VERTEBRAL ARTERY:  Antegrade LEFT VERTEBRAL ARTERY:  Antegrade IMPRESSION: 1. Estimated 0-49% stenosis within the bilateral internal carotid arteries. Electronically Signed   By: Randa Ngo M.D.   On: 10/02/2020 21:04   DG Swallowing Func-Speech Pathology  Result Date: 10/03/2020 Objective Swallowing Evaluation: Type of Study: MBS-Modified Barium Swallow Study  Patient Details Name: JET ARMBRUST MRN: [id number] Date of Birth: 12/06/1940 Today's Date: 10/03/2020 Time: SLP Start Time (ACUTE ONLY): 1405 -SLP Stop Time (ACUTE ONLY): 1530 SLP Time Calculation (min) (ACUTE ONLY): 85 min Past Medical History: Past Medical History: Diagnosis Date . Arthritis   left hip  . GERD (gastroesophageal reflux disease) Pre 2002  Mild . History of kidney stones  . 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 Parkinson disease ([place]) 2004  Slowly progressive . Ruptured appendix teens Past Surgical History: Past Surgical History: Procedure Laterality Date . APPENDECTOMY   . CATARACT EXTRACTION W/ INTRAOCULAR LENS  IMPLANT, BILATERAL  2017 . DEEP BRAIN STIMULATOR PLACEMENT  8/14  L STN . DOPPLER ECHOCARDIOGRAPHY  01/10/2009  LV NML Mild LVH EF 60-65% aortic sclerosis w/0 stenosis  . JOINT REPLACEMENT    left hip replacement 08/14/11/[place]  . SUBTHALAMIC STIMULATOR BATTERY REPLACEMENT  Left 03/25/2017  Procedure: Deep Brain Stimulator battery replacement;  Surgeon: Erline Levine, MD;  Location: [place];  Service: Neurosurgery;  Laterality: Left;  left . TONSILLECTOMY   . TOTAL HIP REVISION  08/27/2011  Procedure: TOTAL HIP REVISION;  Surgeon: Mauri Pole, MD;  Location: [facility];  Service: Orthopedics;  Laterality: Left; HPI: Ronal Grabe is a 80 y.o. male with history of Parkinson's disease s/p DBS 2014, dementia, spinal stenosis, myelodysplastic syndrome who presented to ED after being found unresponsive by family. CXR shows left lower lobe PNA. Just prior to SLP arrival for swallow assessment, pt was eating and appeared to aspirate (observed by family) and then lost consciousness. Vital signs stabilized; discussed with pt, family and MD and opted to proceed with MBS for evaluation of swallow function. Clinical swallow evaluation found in chart from 2019; regular/thin was recommended with therapist noting wet vocal quality after multiple or large sips of thin.  Subjective: "It was the steak." Assessment / Plan / Recommendation CHL IP CLINICAL IMPRESSIONS 10/03/2020 Clinical Impression Patient SLP Visit Diagnosis Dysphagia, oropharyngeal phase (R13.12) Attention and concentration deficit following -- Frontal lobe and executive function deficit following -- Impact on safety and function Moderate aspiration risk;Severe aspiration risk;Risk for inadequate nutrition/hydration   CHL IP TREATMENT RECOMMENDATION 10/03/2020 Treatment Recommendations Therapy as outlined in treatment plan below   Prognosis 10/03/2020 Prognosis for Safe Diet Advancement Guarded Barriers to Reach Goals Cognitive deficits;Severity of deficits Barriers/Prognosis Comment -- CHL IP DIET RECOMMENDATION 10/03/2020 SLP Diet Recommendations NPO except meds;Ice chips PRN after oral care Liquid Administration via Cup Medication Administration Crushed with puree Compensations Slow rate;Small sips/bites;Multiple dry swallows after each bite/sip  Postural Changes Seated upright at 90 degrees;Remain semi-upright after after feeds/meals (Comment)   CHL IP  OTHER RECOMMENDATIONS 10/03/2020 Recommended Consults Other (Comment) Oral Care Recommendations Oral care QID;Oral care before and after PO;Staff/trained caregiver to provide oral care Other Recommendations Have oral suction available   CHL IP FOLLOW UP RECOMMENDATIONS 10/03/2020 Follow up Recommendations Other (comment)   CHL IP FREQUENCY AND DURATION 10/03/2020 Speech Therapy Frequency (ACUTE ONLY) min 2x/week Treatment Duration 2 weeks      CHL IP ORAL PHASE 10/03/2020 Oral Phase Impaired Oral - Pudding Teaspoon -- Oral - Pudding Cup -- Oral - Honey Teaspoon Weak lingual manipulation;Reduced posterior propulsion;Lingual/palatal residue Oral - Honey Cup Weak lingual manipulation;Reduced posterior propulsion;Lingual/palatal residue Oral - Nectar Teaspoon Weak lingual manipulation;Reduced posterior propulsion;Lingual/palatal residue Oral - Nectar Cup -- Oral - Nectar Straw -- Oral - Thin Teaspoon Weak lingual manipulation;Reduced posterior propulsion;Lingual/palatal residue Oral - Thin Cup -- Oral - Thin Straw -- Oral - Puree Weak lingual manipulation;Reduced posterior propulsion;Lingual/palatal residue Oral - Mech Soft -- Oral - Regular -- Oral - Multi-Consistency -- Oral - Pill -- Oral Phase - Comment --  CHL IP PHARYNGEAL PHASE 10/03/2020 Pharyngeal Phase Impaired Pharyngeal- Pudding Teaspoon -- Pharyngeal -- Pharyngeal- Pudding Cup -- Pharyngeal -- Pharyngeal- Honey Teaspoon Delayed swallow initiation-pyriform sinuses;Reduced epiglottic inversion;Reduced anterior laryngeal mobility;Reduced tongue base retraction;Penetration/Apiration after swallow;Pharyngeal residue - valleculae;Pharyngeal residue - pyriform;Pharyngeal residue - cp segment Pharyngeal Material does not enter airway;Material enters airway, passes BELOW cords without attempt by patient to eject out (silent aspiration) Pharyngeal- Honey Cup  Delayed swallow initiation-pyriform sinuses;Reduced epiglottic inversion;Reduced anterior laryngeal mobility;Reduced laryngeal elevation;Reduced tongue base retraction;Penetration/Apiration after swallow;Pharyngeal residue - valleculae;Pharyngeal residue - pyriform;Pharyngeal residue - cp segment Pharyngeal Material enters airway, passes BELOW cords without attempt by patient to eject out (silent aspiration) Pharyngeal- Nectar Teaspoon Delayed swallow initiation-pyriform sinuses;Reduced epiglottic inversion;Reduced anterior laryngeal mobility;Reduced laryngeal elevation;Reduced tongue base retraction;Penetration/Apiration after swallow;Moderate aspiration;Pharyngeal residue - valleculae;Pharyngeal residue - pyriform;Pharyngeal residue - cp segment Pharyngeal Material enters airway, passes BELOW cords without attempt by patient to eject out (silent aspiration) Pharyngeal- Nectar Cup -- Pharyngeal -- Pharyngeal- Nectar Straw -- Pharyngeal -- Pharyngeal- Thin Teaspoon Delayed swallow initiation-pyriform sinuses;Reduced epiglottic inversion;Reduced anterior laryngeal mobility;Reduced laryngeal elevation;Reduced tongue base retraction;Penetration/Apiration after swallow;Pharyngeal residue - valleculae;Pharyngeal residue - pyriform;Pharyngeal residue - cp segment;Moderate aspiration Pharyngeal Material does not enter airway;Material enters airway, passes BELOW cords without attempt by patient to eject out (silent aspiration) Pharyngeal- Thin Cup -- Pharyngeal -- Pharyngeal- Thin Straw -- Pharyngeal -- Pharyngeal- Puree Delayed swallow initiation-vallecula;Reduced epiglottic inversion;Reduced laryngeal elevation;Reduced anterior laryngeal mobility;Reduced tongue base retraction;Pharyngeal residue - valleculae;Pharyngeal residue - pyriform;Pharyngeal residue - cp segment Pharyngeal Material does not enter airway Pharyngeal- Mechanical Soft -- Pharyngeal -- Pharyngeal- Regular -- Pharyngeal -- Pharyngeal- Multi-consistency  -- Pharyngeal -- Pharyngeal- Pill -- Pharyngeal -- Pharyngeal Comment --  CHL IP CERVICAL ESOPHAGEAL PHASE 10/03/2020 Cervical Esophageal Phase Impaired Pudding Teaspoon -- Pudding Cup -- Honey Teaspoon -- Honey Cup -- Nectar Teaspoon -- Nectar Cup -- Nectar Straw -- Thin Teaspoon -- Thin Cup -- Thin Straw -- Puree -- Mechanical Soft -- Regular -- Multi-consistency -- Pill -- Cervical Esophageal Comment (No Data) Deneise Lever, MS, CCC-SLP Speech-Language Pathologist Aliene Altes 10/03/2020, 4:40 PM                 ASSESSMENT & PLAN:  1. MDS (myelodysplastic syndrome) ([place])   2. B12 deficiency    #MDS Bone marrow biopsy showed primary myelodysplastic syndrome particularly with ring sideroblast (10%).  No increase in blastic cells.  Blast 1%. Cytogenetics normal.MDS FISH panel normal. IPSS-R 1 point from good  cytogenetic; 1 point from anemia with hemoglobin less than 10. Patient has low risk MDS with median survival in the absence of therapy is 5.3 years. Most recent acute on chronic anemia is likely secondary to infection. Repeat CBC showed hemoglobin of 10.1, back to his baseline. I recommend continue observation.   #Vitamin B12 deficiency,  Continue oral vitamin B12 supplementation.  Vitamin B12 level is pending today.  Orders Placed This Encounter  Procedures  . Flow cytometry panel-leukemia/lymphoma work-up    Standing Status:   Future    Number of Occurrences:   1    Standing Expiration Date:   10/15/2021  . CBC with Differential/Platelet    Standing Status:   Future    Number of Occurrences:   1    Standing Expiration Date:   10/15/2021  . Retic Panel    Standing Status:   Future    Number of Occurrences:   1    Standing Expiration Date:   10/15/2021  . Vitamin B12    Standing Status:   Future    Number of Occurrences:   1    Standing Expiration Date:   10/15/2021    All questions were answered. The patient knows to call the clinic with any problems questions or  concerns.  cc Casey Carbon, MD    Return of visit: 6 months   Earlie Server, MD, PhD Hematology Oncology [facility] at [facility] Pager- [phone] 10/15/2020

## 2020-10-15 NOTE — Progress Notes (Signed)
Here for f/u after recent hospital admission and is now a resident at [facility]. Patient has bilateral leg pain for a while.

## 2020-10-15 NOTE — Telephone Encounter (Signed)
Patient called and requested a call back from a nurse. He was hard to understand but I think he said he has not heard from Dr. Vertell Limber at the [facility] yet. Please call patient back.

## 2020-10-17 LAB — COMP PANEL: LEUKEMIA/LYMPHOMA

## 2020-10-17 NOTE — Telephone Encounter (Signed)
Spoke with patients wife (patient unavailable to speak with me due to him living at [facility]) and made her aware that I have faxed over the referral and waiting to hear back. I advised her that I called Dr Melven Sartorius office and left a message checking the status of the referral but I have not heard back. Wife voiced understanding and thanked me for calling.

## 2020-10-18 ENCOUNTER — Telehealth: Payer: Self-pay

## 2020-10-18 DIAGNOSIS — D469 Myelodysplastic syndrome, unspecified: Secondary | ICD-10-CM

## 2020-10-18 NOTE — Telephone Encounter (Addendum)
MyChart message sent to patient.  Please schedule as MD recommends.  MyChart message has not been viewed yet so I tried calling home contact number with no answer and no option to leave a message.  Called patient's daugther [name], emergency contact, and informed her of results.  She will relay the results and MD recommendation to the patient and his wife.  [name] will review 6 month appts on MyChart.

## 2020-10-18 NOTE — Telephone Encounter (Signed)
Done..  lab cbc cmp ldh, MD in 6 months app has been sched as requested

## 2020-10-18 NOTE — Telephone Encounter (Signed)
-----   Message from Earlie Server, MD sent at 10/17/2020 10:37 PM EDT ----- Please notify patient that his blood level has improved since his discharge.  Continue observation. Follow up lab cbc cmp ldh, MD in 6 months, please order labs

## 2020-10-24 ENCOUNTER — Ambulatory Visit
Admission: RE | Admit: 2020-10-24 | Discharge: 2020-10-24 | Disposition: A | Discharge status: Home / Self Care | Payer: Medicare Other | Source: Ambulatory Visit | Attending: Physician Assistant | Admitting: Physician Assistant

## 2020-10-24 ENCOUNTER — Other Ambulatory Visit: Payer: Self-pay

## 2020-10-24 ENCOUNTER — Encounter: Payer: Self-pay | Admitting: Urology

## 2020-10-24 ENCOUNTER — Ambulatory Visit (INDEPENDENT_AMBULATORY_CARE_PROVIDER_SITE_OTHER): Payer: Medicare Other | Admitting: Urology

## 2020-10-24 VITALS — BP 100/60 | HR 57 | Ht 72.0 in | Wt 152.0 lb

## 2020-10-24 DIAGNOSIS — N3941 Urge incontinence: Secondary | ICD-10-CM | POA: Diagnosis not present

## 2020-10-24 DIAGNOSIS — M5136 Other intervertebral disc degeneration, lumbar region: Secondary | ICD-10-CM | POA: Diagnosis not present

## 2020-10-24 DIAGNOSIS — N201 Calculus of ureter: Secondary | ICD-10-CM | POA: Insufficient documentation

## 2020-10-24 DIAGNOSIS — N200 Calculus of kidney: Secondary | ICD-10-CM | POA: Diagnosis not present

## 2020-10-24 LAB — URINALYSIS, COMPLETE
Bilirubin, UA: NEGATIVE
Glucose, UA: NEGATIVE
Leukocytes,UA: NEGATIVE
Nitrite, UA: NEGATIVE
RBC, UA: NEGATIVE
Specific Gravity, UA: 1.02 (ref 1.005–1.030)
Urobilinogen, Ur: 0.2 mg/dL (ref 0.2–1.0)
pH, UA: 6 (ref 5.0–7.5)

## 2020-10-24 LAB — MICROSCOPIC EXAMINATION

## 2020-10-24 NOTE — Progress Notes (Signed)
10/24/2020 9:51 AM   Casey Tucker 1940/09/02 [id number]  Referring provider: [name], MD [address] [place],  [name] [address]  Chief Complaint  Patient presents with  . Nephrolithiasis    HPI: 80 y.o. male with Parkinson's disease and myelodysplastic syndrome presents for follow-up of a right ureteral calculus.   Admitted [facility] 10/03/2020 with altered mental status and leukocytosis due to concerns of sepsis  CT performed in the ED which showed multiple nonobstructing right lower pole calculi and a 5 mm right proximal ureteral calculus with mild hydronephrosis  He was completely asymptomatic and office follow-up was recommended  He remains without flank or abdominal pain  His primary concern is significant urinary incontinence with urinary frequency, urgency and urge incontinence  He last saw Dr. Junious Silk in 2020 and was on tamsulosin, finasteride and solifenacin; presently off all of these medications though the reason not clear  Discharged on 3/28 with a diagnosis of pneumonia  Urine culture with insignificant growth   PMH: Past Medical History:  Diagnosis Date  . Arthritis    left hip   . GERD (gastroesophageal reflux disease) Pre 2002   Mild  . History of kidney stones   . 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 Parkinson disease ([place]) 2004   Slowly progressive  . Ruptured appendix teens    Surgical History: Past Surgical History:  Procedure Laterality Date  . APPENDECTOMY    . CATARACT EXTRACTION W/ INTRAOCULAR LENS  IMPLANT, BILATERAL  2017  . DEEP BRAIN STIMULATOR PLACEMENT  8/14   L STN  . DOPPLER ECHOCARDIOGRAPHY  01/10/2009   LV NML Mild LVH EF 60-65% aortic sclerosis w/0 stenosis   . JOINT REPLACEMENT     left hip replacement 08/14/11/[place]   . SUBTHALAMIC STIMULATOR BATTERY REPLACEMENT Left 03/25/2017   Procedure: Deep Brain Stimulator battery  replacement;  Surgeon: Erline Levine, MD;  Location: [place];  Service: Neurosurgery;  Laterality: Left;  left  . TONSILLECTOMY    . TOTAL HIP REVISION  08/27/2011   Procedure: TOTAL HIP REVISION;  Surgeon: Mauri Pole, MD;  Location: [facility];  Service: Orthopedics;  Laterality: Left;    Home Medications:  Allergies as of 10/24/2020      Reactions   Amoxicillin    Celebrex [celecoxib]    Demerol Other (See Comments)   Hallucinations    Penicillins    REACTION: RASH      Medication List       Accurate as of October 24, 2020  9:51 AM. If you have any questions, ask your nurse or doctor.        acetaminophen 325 MG tablet Commonly known as: TYLENOL Take 650 mg by mouth every 6 (six) hours as needed.   carbidopa-levodopa 25-100 MG tablet Commonly known as: SINEMET IR Take 1 tablet by mouth daily as needed (Bradykinesia). What changed: Another medication with the same name was removed. Continue taking this medication, and follow the directions you see here. Changed by: Abbie Sons, MD   carbidopa-levodopa 25-100 MG disintegrating tablet Commonly known as: PARCOPA TAKE 2 AT 7:00, 1 AT 10AM,1 AT 1PM, 1 AT 4PM AND 1 AT 7PM What changed: Another medication with the same name was removed. Continue taking this medication, and follow the directions you see here. Changed by: Abbie Sons, MD   feeding supplement Liqd Take 237 mLs by mouth 3 (three) times daily  between meals.   ferrous sulfate 325 (65 FE) MG tablet Take 325 mg by mouth every other day.   gabapentin 100 MG capsule Commonly known as: NEURONTIN TAKE ONE CAPSULE THREE TIMES A DAY AND TWO CAPSULES AT BEDTIME.   magnesium hydroxide 400 MG/5ML suspension Commonly known as: MILK OF MAGNESIA Take 15 mLs by mouth daily as needed for mild constipation.   sennosides-docusate sodium 8.6-50 MG tablet Commonly known as: SENOKOT-S Take 2 tablets by mouth daily.       Allergies:  Allergies  Allergen Reactions  .  Amoxicillin   . Celebrex [Celecoxib]   . Demerol Other (See Comments)    Hallucinations   . Penicillins     REACTION: RASH    Family History: Family History  Problem Relation Age of Onset  . Arthritis Mother        knee replacement  . COPD Father        emphysema, smoker  . Heart disease Father        CHF  . Healthy Sister   . Alcohol abuse Paternal Uncle   . Rheum arthritis Daughter     Social History:  reports that he has never smoked. He has never used smokeless tobacco. He reports previous alcohol use of about 7.0 standard drinks of alcohol per week. He reports that he does not use drugs.   Physical Exam: BP 100/60   Pulse (!) 57   Ht 6' (1.829 m)   Wt 152 lb (68.9 kg)   BMI 20.61 kg/m   Constitutional:  Alert, No acute distress. HEENT: [place] AT, moist mucus membranes.  Trachea midline, no masses. Cardiovascular: No clubbing, cyanosis, or edema. Respiratory: Normal respiratory effort, no increased work of breathing. Skin: No rashes, bruises or suspicious lesions. Neurologic: Grossly intact, no focal deficits, moving all 4 extremities. Psychiatric: Normal mood and affect.  Laboratory Data:  Urinalysis Dipstick/microscopy negative  Pertinent Imaging: CT 10/02/2020 personally reviewed and interpreted  Assessment & Plan:    1.  Right proximal ureteral calculus  Asymptomatic, mild hydronephrosis We discussed various treatment options for urolithiasis including observation with or without medical expulsive therapy, shockwave lithotripsy (SWL), ureteroscopy and laser lithotripsy with stent placement. We discussed that management is based on stone size, location, density, patient co-morbidities, and patient preference.  Stones <87mm in size have a >80% spontaneous passage rate. Data surrounding the use of tamsulosin for medical expulsive therapy is controversial, but meta analyses suggests it is most efficacious for distal stones between 5-19mm in size. Possible side  effects include dizziness/lightheadedness SWL has a lower stone free rate in a single procedure, but also a lower complication rate compared to ureteroscopy and avoids a stent and associated stent related symptoms. Possible complications include renal hematoma, steinstrasse, and need for additional treatment. Ureteroscopy with laser lithotripsy and stent placement has a higher stone free rate than SWL in a single procedure, however increased complication rate including possible infection, ureteral injury, bleeding, and stent related morbidity. Common stent related symptoms include dysuria, urgency/frequency, and flank pain. He is asymptomatic and after discussion of the risks and benefits of the above treatment options, the patient would like to proceed with a trial of passage.  KUB ordered today to see if there has been any distal stone progression.  He will be notified of results and further recommendations  2.  Urge incontinence  Very bothersome  Was previously on combination therapy for BPH and solifenacin which has been discontinued  Trial Myrbetriq 25 mg daily  Abbie Sons, [place] [address] [place], [place] [address] [phone]

## 2020-10-25 ENCOUNTER — Ambulatory Visit (INDEPENDENT_AMBULATORY_CARE_PROVIDER_SITE_OTHER): Payer: Medicare Other | Admitting: Neurology

## 2020-10-25 DIAGNOSIS — K117 Disturbances of salivary secretion: Secondary | ICD-10-CM

## 2020-10-25 MED ORDER — CARBIDOPA-LEVODOPA 25-100 MG PO TABS
1.0000 | ORAL_TABLET | Freq: Once | ORAL | Status: AC
  Administered 2020-10-25: 1 via ORAL

## 2020-10-25 MED ORDER — RIMABOTULINUMTOXINB 5000 UNIT/ML IM SOLN
5000.0000 [IU] | Freq: Once | INTRAMUSCULAR | Status: AC
  Administered 2020-10-25: 5000 [IU] via INTRAMUSCULAR

## 2020-10-25 NOTE — Procedures (Signed)
Botulinum Clinic    History:  Diagnosis: Sialorrhea    Result History  Onset of effect: n/a Duration of Benefit: n/a Adverse Effects: n/a  Consent obtained from: The patient The patient was educated on the botulinum toxin the black blox warning and given a copy of the botox patient medication guide.  The patient understands that this warning states that there have been reported cases of the Botox extending beyond the injection site and creating adverse effects, similar to those of botulism. This included loss of strength, trouble walking, hoarseness, trouble saying words clearly, loss of bladder control, trouble breathing, trouble swallowing, diplopia, blurry vision and ptosis. Most of the distant spread of Botox was happening in patients, primarily children, who received medication for spasticity or for cervical dystonia. The patient expressed understanding and desire to proceed.     Injections  Location Left  Right Units Number of sites  Submandibular gland 250 250 500 1 per side  Parotid 2250 2250 2500 1 per side  TOTAL UNITS:     5000      Type of Toxin: Myobloc type B As ordered and injected IM at today's visit Total Units: 5000  Discarded Units: 0  Needle drawback with each injection was free of blood. Pt tolerated procedure well without complications.   Reinjection is anticipated in 3 months.

## 2020-10-25 NOTE — Progress Notes (Signed)
TRANSITION CARE MANAGEMENT  Assessment/Plan:   1.  Parkinsons Disease  -Status post left STN DBS at [facility] in August, 2014.  Battery last changed in September, 2018.    Unfortunately, battery is near end-of-life now and needs changed.  We will send a referral to Dr. Vertell Limber.   -Because of swallowing issues at the facility, we have changed him to Parcopa 25/100, 2 tablets at 7:30 AM/1 tablet at 10 AM/1 tablet at 1 PM/1 tablet at 4 PM/1 tablet at 7 PM. Can take an extra if needed but not daily.  Told him no more than 7 tabls per day.  -Today, the patient was clearly underdosed.  He stated that he had not been getting his medicine for quite some time.  I did not verify that, but he surely looked underdosed.  I gave him 1 levodopa in the office.  He asked for 2 additional more.  I did not give him that, but I did write a note to the facility asking them to doses of medication and again immediately when he got back. 2.PDD, advancing -Patient should not be driving.  Patient currently living at [facility]. 3. Myelodysplastic syndrome  -Following with hematology/oncology 4.  Dysphagia, associated with Parkinson's disease  -Modified barium swallow while in the hospital in March, 2022, with evidence of moderate to severe aspiration risk.  Discussed with patient and family that his level of care/advanced directives really do not line up with his diet.  Patient wants to eat for quality of life, but is a large risk for aspiration.  Certainly, one cannot argue that diet contributes to quality of life.  However, he continues to want to be full code.  Expressed to the patient that it is highly likely he is going to end up on a ventilator, and it is also highly likely that it will be very difficult to get him off of that.  They asked a lot of questions about this.  It is possible that we could repeat his swallow study, but would like to get a little farther away from this hospitalization  before we do this.  Daughters are going to have another conversation with patient with Education officer, museum about this at [facility].  Discussed [employer] form and social worker at [facility] should be able to help them fill this out. 5.  Sialorrhea  -Myobloc done today, October 25, 2020.  Subjective:   Casey Tucker was seen today.  Patient originally in for Botox injections, but when I walked in the room, patient stated that he was in significant pain.  Stated that his last levodopa was given at 5 AM (seen at approximately 9:20 AM).  Patient states that he is having difficulty moving.  Current prescribed movement disorder medications: Carbidopa/levodopa 25/100, 2 tablets at 7:30 AM/1 tablet at 10 AM/1 tablet at 1 PM/1 tablet at 4 PM/1 tablet at 7 PM(states that he often takes 7 per day) - he knew this dosing schedule without prompting  PREVIOUS MEDICATIONS: entacapone (d/c last visit d/t confusion)  ALLERGIES:   Allergies  Allergen Reactions  . Amoxicillin   . Celebrex [Celecoxib]   . Demerol Other (See Comments)    Hallucinations   . Penicillins     REACTION: RASH    CURRENT MEDICATIONS:  Outpatient Encounter Medications as of 10/25/2020  Medication Sig  . acetaminophen (TYLENOL) 325 MG tablet Take 650 mg by mouth every 6 (six) hours as  needed.  . carbidopa-levodopa (PARCOPA) 25-100 MG disintegrating tablet TAKE 2 AT 7:00, 1 AT 10AM,1 AT 1PM, 1 AT 4PM AND 1 AT 7PM  . carbidopa-levodopa (SINEMET IR) 25-100 MG tablet Take 1 tablet by mouth daily as needed (Bradykinesia).  . feeding supplement (ENSURE ENLIVE / ENSURE PLUS) LIQD Take 237 mLs by mouth 3 (three) times daily between meals.  . ferrous sulfate 325 (65 FE) MG tablet Take 325 mg by mouth every other day.  . gabapentin (NEURONTIN) 100 MG capsule TAKE ONE CAPSULE THREE TIMES A DAY AND TWO CAPSULES AT BEDTIME.  . magnesium hydroxide (MILK OF MAGNESIA) 400 MG/5ML suspension Take 15 mLs by mouth daily as needed for mild  constipation.  . sennosides-docusate sodium (SENOKOT-S) 8.6-50 MG tablet Take 2 tablets by mouth daily.   No facility-administered encounter medications on file as of 10/25/2020.    Objective:   PHYSICAL EXAMINATION:    VITALS:   There were no vitals filed for this visit.  GEN:  The patient appears stated age and is in NAD.  Patient looks uncomfortable.  There is a slight smell of urine HEENT:  Normocephalic, atraumatic.  The mucous membranes are moist. The superficial temporal arteries are without ropiness or tenderness.  His head is turned to the right.  Neurological examination:  Orientation: The patient is alert and oriented to person/place. Cranial nerves: There is good facial symmetry with facial hypomimia. The speech is fluent and dysarthric. Soft palate rises symmetrically and there is no tongue deviation. Hearing is intact to conversational tone. Sensation: Sensation is intact to light touch throughout Motor: Strength is at least antigravity x4.  Movement examination: Tone: There is mild increased tone in the left upper extremity Abnormal movements: His right arm has intermittent tremor (I have actually never seen him with tremor) Coordination:  There is decremation with RAM's, with any form of RAMS, including alternating supination and pronation of the forearm, hand opening and closing, finger taps, heel taps and toe taps bilaterally Gait and Station: Not tested. I have reviewed and interpreted the following labs independently    Chemistry      Component Value Date/Time   NA 137 10/04/2020 0543   NA 142 08/11/2016 1701   NA 139 08/15/2011 0535   K 3.9 10/04/2020 0543   K 3.8 08/15/2011 0535   CL 107 10/04/2020 0543   CL 101 08/15/2011 0535   CO2 25 10/04/2020 0543   CO2 29 08/15/2011 0535   BUN 12 10/04/2020 0543   BUN 18 08/11/2016 1701   BUN 12 08/15/2011 0535   CREATININE 0.60 (L) 10/04/2020 0543   CREATININE 0.73 08/15/2011 0535      Component Value  Date/Time   CALCIUM 8.2 (L) 10/04/2020 0543   CALCIUM 7.6 (L) 08/15/2011 0535   ALKPHOS 46 10/03/2020 0429   AST 9 (L) 10/03/2020 0429   ALT 6 10/03/2020 0429   BILITOT 1.3 (H) 10/03/2020 0429   BILITOT 1.2 08/11/2016 1701       Lab Results  Component Value Date   WBC 4.5 10/15/2020   HGB 10.1 (L) 10/15/2020   HCT 30.1 (L) 10/15/2020   MCV 110.7 (H) 10/15/2020   PLT 326 10/15/2020    Lab Results  Component Value Date   TSH 4.029 10/03/2020      Cc:  [name], MD

## 2020-10-28 DIAGNOSIS — M48062 Spinal stenosis, lumbar region with neurogenic claudication: Secondary | ICD-10-CM | POA: Diagnosis not present

## 2020-10-28 DIAGNOSIS — N3281 Overactive bladder: Secondary | ICD-10-CM | POA: Diagnosis not present

## 2020-10-28 DIAGNOSIS — F0280 Dementia in other diseases classified elsewhere without behavioral disturbance: Secondary | ICD-10-CM | POA: Diagnosis not present

## 2020-10-28 DIAGNOSIS — D469 Myelodysplastic syndrome, unspecified: Secondary | ICD-10-CM | POA: Diagnosis not present

## 2020-10-28 DIAGNOSIS — G20 Parkinson's disease: Secondary | ICD-10-CM | POA: Diagnosis not present

## 2020-10-28 DIAGNOSIS — Z4542 Encounter for adjustment and management of neuropacemaker (brain) (peripheral nerve) (spinal cord): Secondary | ICD-10-CM | POA: Diagnosis not present

## 2020-10-29 ENCOUNTER — Other Ambulatory Visit: Payer: Self-pay | Admitting: Neurosurgery

## 2020-10-30 ENCOUNTER — Non-Acute Institutional Stay: Payer: Medicare Other | Admitting: Adult Health Nurse Practitioner

## 2020-10-30 ENCOUNTER — Other Ambulatory Visit: Payer: Self-pay

## 2020-10-30 VITALS — HR 62 | Wt 152.0 lb

## 2020-10-30 DIAGNOSIS — J189 Pneumonia, unspecified organism: Secondary | ICD-10-CM | POA: Diagnosis not present

## 2020-10-30 DIAGNOSIS — E43 Unspecified severe protein-calorie malnutrition: Secondary | ICD-10-CM

## 2020-10-30 DIAGNOSIS — R4189 Other symptoms and signs involving cognitive functions and awareness: Secondary | ICD-10-CM | POA: Diagnosis not present

## 2020-10-30 DIAGNOSIS — F0150 Vascular dementia without behavioral disturbance: Secondary | ICD-10-CM | POA: Diagnosis not present

## 2020-10-30 DIAGNOSIS — G20 Parkinson's disease: Secondary | ICD-10-CM | POA: Diagnosis not present

## 2020-10-30 DIAGNOSIS — D469 Myelodysplastic syndrome, unspecified: Secondary | ICD-10-CM | POA: Diagnosis not present

## 2020-10-30 DIAGNOSIS — Z515 Encounter for palliative care: Secondary | ICD-10-CM

## 2020-10-30 DIAGNOSIS — M48062 Spinal stenosis, lumbar region with neurogenic claudication: Secondary | ICD-10-CM | POA: Diagnosis not present

## 2020-10-30 DIAGNOSIS — R55 Syncope and collapse: Secondary | ICD-10-CM | POA: Diagnosis not present

## 2020-10-30 DIAGNOSIS — K219 Gastro-esophageal reflux disease without esophagitis: Secondary | ICD-10-CM | POA: Diagnosis not present

## 2020-10-30 DIAGNOSIS — R278 Other lack of coordination: Secondary | ICD-10-CM | POA: Diagnosis not present

## 2020-10-30 DIAGNOSIS — R2689 Other abnormalities of gait and mobility: Secondary | ICD-10-CM | POA: Diagnosis not present

## 2020-10-30 DIAGNOSIS — Z749 Problem related to care provider dependency, unspecified: Secondary | ICD-10-CM | POA: Diagnosis not present

## 2020-10-30 NOTE — Progress Notes (Signed)
[facility] Consult Note Telephone: [phone]  Fax: [phone]    Date of encounter: 10/30/20 PATIENT NAME: Casey Tucker [address] [place] [address]   [phone] (home)  DOB: 1940/07/29 MRN: [id number] PRIMARY CARE PROVIDER:    [name], MD,  [place] [address] [phone]  REFERRING PROVIDER:   [name], MD [address] [place],  [place] [address] [phone]  RESPONSIBLE PARTY:    Contact Information    Name Relation Home Work [place] Daughter   [phone]   Casey Tucker [phone]     Casey Tucker Spouse [place]   Casey Tucker Daughter   [phone]       I met face to face with patient and family in thefacility. Palliative Care was asked to follow this patient by consultation request of  Casey Carbon, MD to address advance care planning and complex medical decision making. This is a follow up visit.                                   ASSESSMENT AND PLAN / RECOMMENDATIONS:   Advance Care Planning/Goals of Care: Goals include to maximize quality of life and symptom management.  Spoke with patient's daughter and she confirms that he is recently made the decision to become a DNR and he also wants to be a do not hospitalize.  CODE STATUS: DNR  Symptom Management/Plan:  Patient has no new complaints today and staff have no new concerns.  Patient is thriving at the facility and he is more consistently getting his meals and properly receiving his medications.  Daughter does state that he will frequently asked to go back home but has been told by facility that if he leaves that he will not be able to come back as a resident.  Have reached out to social work at her at facility with daughter's concerns.  Have also discussed with her ongoing discussion with the patient about his new living arrangements and  his concerns about his finances.  Patient is doing very well at the facility and will continue to encourage him with how well he is doing with the extra care he is getting at the facility.   Follow up Palliative Care Visit: Palliative care will continue to follow for complex medical decision making, advance care planning, and clarification of goals. Return in 8-10 weeks or prn.  I spent 45 minutes providing this consultation. More than 50% of the time in this consultation was spent in counseling and care coordination.   PPS: 40%  HOSPICE ELIGIBILITY/DIAGNOSIS: TBD  Chief Complaint: follow up palliative visit for complex medical decision making  HISTORY OF PRESENT ILLNESS:  Casey Tucker is a 80 y.o. year old male  with Parkinson's with some dementia, OA, GERD .  Patient had hospitalization 3/16 to 10/06/2020 for aspiration pneumonia.  After ST evaluation patient was found that he was aspirating on any foods that he was taking in.  He is currently on dysphagia 2 diet (minced only), thin liquids, meds in pure.  Patient was receiving therapy services at the facility and is now a long-term care resident at the facility.  Patient today voicing that he wants to go home with possible discharge today.  Patient has to be reminded that he requires 24/7 care whether at home or at  the facility.  Patient voices frustration with having in-home help for him and his wife.  His wife is having early onset dementia and is having cognitive issues and is less able to care for him at home.  Patient does state having pain in his thighs and he is on gabapentin for this.  Patient denies dizziness.  Staff does not report any falls since he has been here.  Appetite is good and patient has gained weight.  He weighed 148 pounds on 10/09/2020 and on 10/22/2020 weighs 152 pounds.  Rest of 10 point ROS asked and negative except what is stated in HPI. Talked with daughter, Casey Tucker, via phone and she does state that her father will be a  long-term care resident at the facility.  States that her sister is healthcare power of attorney.  Daughter does state that there was an instance at the facility in which a CNA was not available to go with her father to a doctor's appointment and her mother was called.  This upset her mother and she called the her father and this upset him to.  Have reached out to social worker at facility to have his wife taken off the list to be contacted in these instances as with her cognitive issues she is not able to assist and this causes more frustration.  History obtained from review of EMR, discussion with primary team, and interview with family, facility staff, and Casey Tucker.  I reviewed available labs, medications, imaging, studies and related documents from the EMR.  Records reviewed and summarized above.   PHYSICAL EXAM:  General: NAD, frail appearing, thin Eyes: sclera anicteric and noninjected with no discharge noted ENMT:  Moist mucous membranes Cardiovascular: regular rate and rhythm Pulmonary:lung sounds clear; normal respiratory effort Abdomen: soft, nontender, + bowel sounds Extremities: no edema, arthritic changes to hands Neurological: Weakness; A&O x3, has forgetfulness and confusion at times   Thank you for the opportunity to participate in the care of Casey Tucker.  The palliative care team will continue to follow. Please call our office at [phone] if we can be of additional assistance.   Casey Tucker Jenetta Downer, NP , DNP  This chart was dictated using voice recognition software. Despite best efforts to proofread, errors can occur which can change the documentation meaning.   COVID-19 PATIENT SCREENING TOOL Asked and negative response unless otherwise noted:   Have you had symptoms of covid, tested positive or been in contact with someone with symptoms/positive test in the past 5-10 days? negative

## 2020-10-31 ENCOUNTER — Telehealth: Payer: Self-pay | Admitting: Adult Health Nurse Practitioner

## 2020-10-31 DIAGNOSIS — F0150 Vascular dementia without behavioral disturbance: Secondary | ICD-10-CM | POA: Diagnosis not present

## 2020-10-31 DIAGNOSIS — M48062 Spinal stenosis, lumbar region with neurogenic claudication: Secondary | ICD-10-CM | POA: Diagnosis not present

## 2020-10-31 DIAGNOSIS — R55 Syncope and collapse: Secondary | ICD-10-CM | POA: Diagnosis not present

## 2020-10-31 DIAGNOSIS — J189 Pneumonia, unspecified organism: Secondary | ICD-10-CM | POA: Diagnosis not present

## 2020-10-31 DIAGNOSIS — K219 Gastro-esophageal reflux disease without esophagitis: Secondary | ICD-10-CM | POA: Diagnosis not present

## 2020-10-31 DIAGNOSIS — D469 Myelodysplastic syndrome, unspecified: Secondary | ICD-10-CM | POA: Diagnosis not present

## 2020-10-31 NOTE — Telephone Encounter (Signed)
[facility] facility returned my phone call.  We discussed my visit with the patient yesterday and concerns that daughter had.  Encouraged to reach out to me with any concerns in the future Casey Tucker K. Olena Heckle, NP

## 2020-11-01 DIAGNOSIS — D469 Myelodysplastic syndrome, unspecified: Secondary | ICD-10-CM | POA: Diagnosis not present

## 2020-11-01 DIAGNOSIS — F0150 Vascular dementia without behavioral disturbance: Secondary | ICD-10-CM | POA: Diagnosis not present

## 2020-11-01 DIAGNOSIS — J189 Pneumonia, unspecified organism: Secondary | ICD-10-CM | POA: Diagnosis not present

## 2020-11-01 DIAGNOSIS — R55 Syncope and collapse: Secondary | ICD-10-CM | POA: Diagnosis not present

## 2020-11-01 DIAGNOSIS — K219 Gastro-esophageal reflux disease without esophagitis: Secondary | ICD-10-CM | POA: Diagnosis not present

## 2020-11-01 DIAGNOSIS — M48062 Spinal stenosis, lumbar region with neurogenic claudication: Secondary | ICD-10-CM | POA: Diagnosis not present

## 2020-11-04 ENCOUNTER — Telehealth: Payer: Self-pay | Admitting: Family Medicine

## 2020-11-04 NOTE — Telephone Encounter (Signed)
Spoke to patient and he is interested in having Lithotripsy. The only person on his DPR is [name] his daughter. [phone]. Patient is at [facility].

## 2020-11-04 NOTE — Telephone Encounter (Signed)
Patient's daughter [name] notified and she will speak to her father who is at [facility]. She will call back to let us know if he wants to do Lithotripsy or not.

## 2020-11-04 NOTE — Telephone Encounter (Signed)
-----   Message from Abbie Sons, MD sent at 11/03/2020 12:18 PM EDT ----- Casey Tucker unchanged on KUB.  Does he desire to schedule shockwave lithotripsy.  He may still be in rehab

## 2020-11-05 DIAGNOSIS — D469 Myelodysplastic syndrome, unspecified: Secondary | ICD-10-CM | POA: Diagnosis not present

## 2020-11-05 DIAGNOSIS — R55 Syncope and collapse: Secondary | ICD-10-CM | POA: Diagnosis not present

## 2020-11-05 DIAGNOSIS — F0150 Vascular dementia without behavioral disturbance: Secondary | ICD-10-CM | POA: Diagnosis not present

## 2020-11-05 DIAGNOSIS — K219 Gastro-esophageal reflux disease without esophagitis: Secondary | ICD-10-CM | POA: Diagnosis not present

## 2020-11-05 DIAGNOSIS — J189 Pneumonia, unspecified organism: Secondary | ICD-10-CM | POA: Diagnosis not present

## 2020-11-05 DIAGNOSIS — M48062 Spinal stenosis, lumbar region with neurogenic claudication: Secondary | ICD-10-CM | POA: Diagnosis not present

## 2020-11-06 DIAGNOSIS — F0150 Vascular dementia without behavioral disturbance: Secondary | ICD-10-CM | POA: Diagnosis not present

## 2020-11-06 DIAGNOSIS — J189 Pneumonia, unspecified organism: Secondary | ICD-10-CM | POA: Diagnosis not present

## 2020-11-06 DIAGNOSIS — R55 Syncope and collapse: Secondary | ICD-10-CM | POA: Diagnosis not present

## 2020-11-06 DIAGNOSIS — M48062 Spinal stenosis, lumbar region with neurogenic claudication: Secondary | ICD-10-CM | POA: Diagnosis not present

## 2020-11-06 DIAGNOSIS — K219 Gastro-esophageal reflux disease without esophagitis: Secondary | ICD-10-CM | POA: Diagnosis not present

## 2020-11-06 DIAGNOSIS — D469 Myelodysplastic syndrome, unspecified: Secondary | ICD-10-CM | POA: Diagnosis not present

## 2020-11-07 DIAGNOSIS — M79671 Pain in right foot: Secondary | ICD-10-CM | POA: Diagnosis not present

## 2020-11-08 ENCOUNTER — Other Ambulatory Visit: Payer: Self-pay

## 2020-11-08 ENCOUNTER — Non-Acute Institutional Stay: Payer: Medicare Other | Admitting: Adult Health Nurse Practitioner

## 2020-11-08 DIAGNOSIS — Z515 Encounter for palliative care: Secondary | ICD-10-CM | POA: Diagnosis not present

## 2020-11-08 DIAGNOSIS — G20 Parkinson's disease: Secondary | ICD-10-CM | POA: Diagnosis not present

## 2020-11-08 DIAGNOSIS — F0150 Vascular dementia without behavioral disturbance: Secondary | ICD-10-CM | POA: Diagnosis not present

## 2020-11-08 NOTE — Progress Notes (Signed)
[facility] Consult Note Telephone: [phone]  Fax: [phone]    Date of encounter: 11/08/20 PATIENT NAME: Casey Tucker [address] [place] [address]   [phone] (home)  DOB: June 16, 1941 MRN: [id number] PRIMARY CARE PROVIDER:    [name], MD,  [place] [address] [phone]  REFERRING PROVIDER:   [name], MD [address] [place],  [name] [address] [phone]  RESPONSIBLE PARTY:    Contact Information    Name Relation Home Work [name] Daughter   [phone]   [name] [phone]     [name], [name] Spouse [phone]  [phone]   [name] Daughter   [phone]       I met face to face with patient in the facility. Palliative Care was asked to follow this patient by consultation request of  [name], MD to address advance care planning and complex medical decision making. This is a follow up visit  Spoke with daughter, [name], vis telephone to update on today's visit                                   ASSESSMENT AND PLAN / RECOMMENDATIONS:   Advance Care Planning/Goals of Care: Goals include to maximize quality of life and symptom management.  CODE STATUS: DNR  Symptom Management/Plan:  Parkinson's/dementia: Patient had questions about returning home to his wife.  Attempted to answer his questions the best I could.  Had discussion with patient that he is better staying at the facility as he has been thriving here.  He is eating better and starting to regain weight.  He is having more stability after working with physical therapy.  He is concerned about his wife, which is understandable.  Talked with his daughter [name] about our conversation and she to agrees that he is better in the facility and has noticed the improvements he has had since being there.  Discussed that this is going to be an ongoing  conversation as he will continue to asked to return home.  He states that he will allow caregivers into the home.  Unfortunately this has been attempted in the past and his wife has turned the caregivers away.  Daughter does state that her mother will also throw away food that others bring in.  Daughter does state that family has their mother/patient's wife on waiting list for bed at the facility.  As above this will be an ongoing conversation but patient does seem to realize and understand when told that he is thriving and doing well at the facility.   Follow up Palliative Care Visit: Palliative care will continue to follow for complex medical decision making, advance care planning, and clarification of goals. Return 8-10 weeks or prn.  Daughter encouraged to call with any questions or concerns  I spent 35 minutes providing this consultation. More than 50% of the time in this consultation was spent in counseling and care coordination.  PPS: 40%  HOSPICE ELIGIBILITY/DIAGNOSIS: TBD  Chief Complaint: follow up palliative visit  HISTORY OF PRESENT ILLNESS:  Casey Tucker is a 80 y.o. year old male  with Parkinson's with some dementia, OA, GERD.   History obtained from review of EMR, and interview with family and Casey Tucker.    PHYSICAL EXAM:  General: NAD, frail appearing, thin Eyes: sclera anicteric and noninjected  with no discharge noted ENMT:  Moist mucous membranes Pulmonary: normal respiratory effort; no cough Extremities: no edema,arthritic changes to hands Neurological: A&O x3, has forgetfulness and confusion at times  Thank you for the opportunity to participate in the care of Casey Tucker.  The palliative care team will continue to follow. Please call our office at [phone] if we can be of additional assistance.   Casey Tucker Jenetta Downer, NP , DNP  This chart was dictated using voice recognition software. Despite best efforts to proofread, errors can occur which can change the  documentation meaning.   COVID-19 PATIENT SCREENING TOOL Asked and negative response unless otherwise noted:   Have you had symptoms of covid, tested positive or been in contact with someone with symptoms/positive test in the past 5-10 days? negative

## 2020-11-09 DIAGNOSIS — K219 Gastro-esophageal reflux disease without esophagitis: Secondary | ICD-10-CM | POA: Diagnosis not present

## 2020-11-09 DIAGNOSIS — M48062 Spinal stenosis, lumbar region with neurogenic claudication: Secondary | ICD-10-CM | POA: Diagnosis not present

## 2020-11-09 DIAGNOSIS — R55 Syncope and collapse: Secondary | ICD-10-CM | POA: Diagnosis not present

## 2020-11-09 DIAGNOSIS — J189 Pneumonia, unspecified organism: Secondary | ICD-10-CM | POA: Diagnosis not present

## 2020-11-09 DIAGNOSIS — F0150 Vascular dementia without behavioral disturbance: Secondary | ICD-10-CM | POA: Diagnosis not present

## 2020-11-09 DIAGNOSIS — D469 Myelodysplastic syndrome, unspecified: Secondary | ICD-10-CM | POA: Diagnosis not present

## 2020-11-10 DIAGNOSIS — J189 Pneumonia, unspecified organism: Secondary | ICD-10-CM | POA: Diagnosis not present

## 2020-11-10 DIAGNOSIS — M48062 Spinal stenosis, lumbar region with neurogenic claudication: Secondary | ICD-10-CM | POA: Diagnosis not present

## 2020-11-10 DIAGNOSIS — K219 Gastro-esophageal reflux disease without esophagitis: Secondary | ICD-10-CM | POA: Diagnosis not present

## 2020-11-10 DIAGNOSIS — R55 Syncope and collapse: Secondary | ICD-10-CM | POA: Diagnosis not present

## 2020-11-10 DIAGNOSIS — D469 Myelodysplastic syndrome, unspecified: Secondary | ICD-10-CM | POA: Diagnosis not present

## 2020-11-10 DIAGNOSIS — F0150 Vascular dementia without behavioral disturbance: Secondary | ICD-10-CM | POA: Diagnosis not present

## 2020-11-13 ENCOUNTER — Ambulatory Visit: Payer: Medicare Other | Admitting: Internal Medicine

## 2020-11-13 DIAGNOSIS — K219 Gastro-esophageal reflux disease without esophagitis: Secondary | ICD-10-CM | POA: Diagnosis not present

## 2020-11-13 DIAGNOSIS — D469 Myelodysplastic syndrome, unspecified: Secondary | ICD-10-CM | POA: Diagnosis not present

## 2020-11-13 DIAGNOSIS — J189 Pneumonia, unspecified organism: Secondary | ICD-10-CM | POA: Diagnosis not present

## 2020-11-13 DIAGNOSIS — M48062 Spinal stenosis, lumbar region with neurogenic claudication: Secondary | ICD-10-CM | POA: Diagnosis not present

## 2020-11-13 DIAGNOSIS — F0150 Vascular dementia without behavioral disturbance: Secondary | ICD-10-CM | POA: Diagnosis not present

## 2020-11-13 DIAGNOSIS — R55 Syncope and collapse: Secondary | ICD-10-CM | POA: Diagnosis not present

## 2020-11-16 DIAGNOSIS — M48062 Spinal stenosis, lumbar region with neurogenic claudication: Secondary | ICD-10-CM | POA: Diagnosis not present

## 2020-11-16 DIAGNOSIS — F0150 Vascular dementia without behavioral disturbance: Secondary | ICD-10-CM | POA: Diagnosis not present

## 2020-11-16 DIAGNOSIS — J189 Pneumonia, unspecified organism: Secondary | ICD-10-CM | POA: Diagnosis not present

## 2020-11-16 DIAGNOSIS — R55 Syncope and collapse: Secondary | ICD-10-CM | POA: Diagnosis not present

## 2020-11-16 DIAGNOSIS — D469 Myelodysplastic syndrome, unspecified: Secondary | ICD-10-CM | POA: Diagnosis not present

## 2020-11-16 DIAGNOSIS — K219 Gastro-esophageal reflux disease without esophagitis: Secondary | ICD-10-CM | POA: Diagnosis not present

## 2020-11-18 DIAGNOSIS — G20 Parkinson's disease: Secondary | ICD-10-CM | POA: Diagnosis not present

## 2020-11-18 DIAGNOSIS — D469 Myelodysplastic syndrome, unspecified: Secondary | ICD-10-CM | POA: Diagnosis not present

## 2020-11-18 DIAGNOSIS — M48062 Spinal stenosis, lumbar region with neurogenic claudication: Secondary | ICD-10-CM | POA: Diagnosis not present

## 2020-11-18 DIAGNOSIS — Z741 Need for assistance with personal care: Secondary | ICD-10-CM | POA: Diagnosis not present

## 2020-11-18 DIAGNOSIS — F0150 Vascular dementia without behavioral disturbance: Secondary | ICD-10-CM | POA: Diagnosis not present

## 2020-11-18 DIAGNOSIS — K219 Gastro-esophageal reflux disease without esophagitis: Secondary | ICD-10-CM | POA: Diagnosis not present

## 2020-11-18 DIAGNOSIS — J189 Pneumonia, unspecified organism: Secondary | ICD-10-CM | POA: Diagnosis not present

## 2020-11-18 DIAGNOSIS — R4189 Other symptoms and signs involving cognitive functions and awareness: Secondary | ICD-10-CM | POA: Diagnosis not present

## 2020-11-18 DIAGNOSIS — R2689 Other abnormalities of gait and mobility: Secondary | ICD-10-CM | POA: Diagnosis not present

## 2020-11-18 DIAGNOSIS — R55 Syncope and collapse: Secondary | ICD-10-CM | POA: Diagnosis not present

## 2020-11-18 DIAGNOSIS — R278 Other lack of coordination: Secondary | ICD-10-CM | POA: Diagnosis not present

## 2020-11-18 NOTE — H&P (Deleted)
Patient ID:   [id number] Patient: Casey Tucker  Date of Birth: 08/01/1964 Visit Type: Office Visit   Date: 11/04/2020 08:45 AM Provider: Marchia Meiers. Vertell Limber MD   This 80 year old male presents for neck pain.  HISTORY OF PRESENT ILLNESS: 1.  neck pain  Patient returns to review his MRI.  Adriana Simas and patient's insurance card lists patient identification as "Casey Tucker".  Will correct office chart.]  The patient continues to complain of severe pain.  He grades this between 7 to 8/10 in severity.  He is unable to get any rest and comes in today looking quite haggered.  He is not shaved in days.  His examination is unchanged and he continues to demonstrate profound weakness in his left hand.  His MRI shows multilevel degenerative changes in the cervical spine with 2 significant areas of concern the 1st is moderate left foraminal stenosis at C6-7 and the 2nd is a large disc herniation at C7-T1 on the left.  Both of these are symptomatic.  The patient's most acute symptomatology relates to the herniated disc at C7-T1.  His foraminal stenosis has been contributing to baseline low level of significant neck and left arm pain.    I have recommended that both of these be treated with surgery.  I believe that the should be dealt with on an expedited basis because of the severity of his symptoms as well as his imaging findings.  My recommendation is for anterior cervical decompression and fusion at the C6-7 and C7-T1 levels.  We went over risks and benefits of surgery and details of the surgical procedure and the patient wishes to go ahead as soon as possible.      Medical/Surgical/Interim History Reviewed, no change.  Last detailed document date:10/31/2020.     PAST MEDICAL HISTORY, SURGICAL HISTORY, FAMILY HISTORY, SOCIAL HISTORY AND REVIEW OF SYSTEMS I have reviewed the patient's past medical, surgical, family and social history as well as the comprehensive review of systems as included on  the [facility] history form dated 10/31/2020, which I have signed.  Family History: Reviewed, no changes.  Last detailed document date:10/31/2020.   Social History: Reviewed, no changes. Last detailed document date: 10/31/2020.    MEDICATIONS: (added, continued or stopped this visit) Started Medication Directions Instruction Stopped 10/31/2020 gabapentin 300 mg capsule take 1 capsule by oral route 3 times every day Start with 1 per night, then 1 twice a day, then 3 times a day. Gradually up-titrate over next few days. For severe cervical radiculopathy.  10/31/2020 methocarbamol 500 mg tablet take 1 tablet by oral route 4 times every day   10/31/2020 oxycodone-acetaminophen 10 mg-325 mg tablet take 0.5 - 1 tablet by oral route  every 4-6 hours as needed      ALLERGIES: Ingredient Reaction Medication Name Comment NO KNOWN ALLERGIES    No known allergies. Reviewed, no changes.    PHYSICAL EXAM:  Vitals Date Temp F BP Pulse Ht In Wt Lb BMI BSA Pain Score 11/04/2020  151/83 69 72 212 28.75  7/10     IMPRESSION:  Herniated cervical disc C7-T1 left with profound left hand weakness and baseline significant left C6-7 foraminal stenosis contributing to left C7 radiculopathy and neck pain which is of a more chronic nature.  PLAN: Proceed with anterior cervical decompression and fusion at the C6-7 and C7-T1 levels on an expedited basis do this severity of the patient's weakness and imaging findings.  Risks and benefits were discussed in detail  with the patient and he wishes to proceed with surgery.  Orders: Diagnostic Procedures: Assessment Procedure M54.12 Cervical Spine- Lateral Instruction(s)/Education: Assessment Instruction R03.0 Lifestyle education Z68.28 Lifestyle education regarding diet Miscellaneous: Assessment  M50.20 Soft Cervical Collar  Completed Orders (this  encounter) Order Details Reason Side Interpretation Result Initial Treatment Date Region Lifestyle education Patient will monitor blood pressure and contact Primary Care Physician if it continues to be elevated       Lifestyle education regarding diet Encouraged patient to eat balanced diet high in fruit and vegetables.        Assessment/Plan  # Detail Type Description  1. Assessment Degeneration, intervertebral disc, cervical (M50.30).     2. Assessment Cervical spondylosis (M47.812).     3. Assessment Cervicalgia (M54.2).     4. 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     5. Assessment Herniated nucleus pulposus, cervical (M50.20).  Plan Orders Soft Cervical Collar.     6. Assessment Cervical radiculopathy (M54.12).     7. Assessment Elevated blood-pressure reading, w/o diagnosis of htn (R03.0).     8. Assessment Body mass index (BMI) 28.0-28.9, adult (A25.05).  Plan Orders Today's instructions / counseling include(s) Lifestyle education regarding diet. Clinical information/comments: Encouraged patient to eat balanced diet high in fruit and vegetables.       Pain Management Plan Pain Scale: 7/10. Method: Numeric Pain Intensity Scale. Location: neck. Onset: 08/04/2009. Duration: varies. Quality: discomforting. Pain management follow-up plan of care: Patient will continue medication management..              Provider:  Marchia Meiers. Vertell Limber MD  11/04/2020 11:11 AM    Dictation edited by: Marchia Meiers. Vertell Limber    CC Providers: Roe Coombs [place],  [place]  [phone]   [place]  [address] [place], [place] [address]               Electronically signed by Marchia Meiers. Vertell Limber MD on 11/04/2020 11:11 AM

## 2020-11-20 DIAGNOSIS — F0150 Vascular dementia without behavioral disturbance: Secondary | ICD-10-CM | POA: Diagnosis not present

## 2020-11-20 DIAGNOSIS — R55 Syncope and collapse: Secondary | ICD-10-CM | POA: Diagnosis not present

## 2020-11-20 DIAGNOSIS — M48062 Spinal stenosis, lumbar region with neurogenic claudication: Secondary | ICD-10-CM | POA: Diagnosis not present

## 2020-11-20 DIAGNOSIS — D469 Myelodysplastic syndrome, unspecified: Secondary | ICD-10-CM | POA: Diagnosis not present

## 2020-11-20 DIAGNOSIS — J189 Pneumonia, unspecified organism: Secondary | ICD-10-CM | POA: Diagnosis not present

## 2020-11-20 DIAGNOSIS — K219 Gastro-esophageal reflux disease without esophagitis: Secondary | ICD-10-CM | POA: Diagnosis not present

## 2020-11-21 ENCOUNTER — Other Ambulatory Visit: Payer: Self-pay

## 2020-11-21 ENCOUNTER — Encounter (HOSPITAL_COMMUNITY): Payer: Self-pay | Admitting: Neurosurgery

## 2020-11-21 NOTE — Progress Notes (Signed)
Surgical Instructions    Your procedure is scheduled on May 6  Report to [facility] Main Entrance "A" at 1000 A.M., then check in with the Admitting office.  Call this number if you have problems the morning of surgery:  [phone]   If you have any questions prior to your surgery date call [phone]: Open Monday-Friday 8am-4pm    Remember:  Do not eat or drink after midnight the night before your surgery  PLEASE SEND COPY OF MAR MORNING OF SURGERY WITH PATIENT, PLEASE INCLUDE MEDS GIVEN MORNING OF SURGERY AND TIME GIVEN     Take these medicines the morning of surgery with A SIP OF WATER  carbidopa-levodopa (PARCOPA)  acetaminophen (TYLENOL) if needed  As of today, STOP taking any Aspirin (unless otherwise instructed by your surgeon) Aleve, Naproxen, Ibuprofen, Motrin, Advil, Goody's, BC's, all herbal medications, fish oil, and all vitamins.                     Do not wear jewelry, make up, or nail polish            Do not wear lotions, powders, perfumes/colognes, or deodorant.            Do not shave 48 hours prior to surgery.  Men may shave face and neck.            Do not bring valuables to the hospital.            [facility] is not responsible for any belongings or valuables.  Do NOT Smoke (Tobacco/Vaping) or drink Alcohol 24 hours prior to your procedure If you use a CPAP at night, you may bring all equipment for your overnight stay.   Contacts, glasses, dentures or bridgework may not be worn into surgery, please bring cases for these belongings   For patients admitted to the hospital, discharge time will be determined by your treatment team.   Patients discharged the day of surgery will not be allowed to drive home, and someone needs to stay with them for 24 hours.    Special instructions:   Day of Surgery: Wear Clean/Comfortable clothing the morning of surgery Do not apply any deodorants/lotions.   Remember to brush your teeth WITH YOUR REGULAR TOOTHPASTE.    Please read over the following fact sheets that you were given.

## 2020-11-21 NOTE — Progress Notes (Signed)
Patient was given pre-op instructions over the phone. The opportunity was given for the patient to ask questions. No further questions asked. Patient verbalized understanding of instructions given.  Spoke with patients daughter [name] to verify patients medical hx.  [facility] where patient resides, faxed over instructions to facility for time of arrival and medication insturctions   Nothing to eat or drink after midnight.  7 days prior to surgery STOP taking any Aspirin (unless otherwise instructed by your surgeon), Aleve, Naproxen, Ibuprofen, Motrin, Advil, Goody's, BC's, all herbal medications, fish oil, and all vitamins.   Anesthesia Review Needed: No

## 2020-11-22 ENCOUNTER — Encounter (HOSPITAL_COMMUNITY): Payer: Self-pay | Admitting: Neurosurgery

## 2020-11-22 ENCOUNTER — Encounter (HOSPITAL_COMMUNITY)
Admission: RE | Disposition: A | Discharge status: Skilled Nursing Facility | Payer: Self-pay | Source: Ambulatory Visit | Attending: Neurosurgery

## 2020-11-22 ENCOUNTER — Ambulatory Visit (HOSPITAL_COMMUNITY): Payer: Medicare Other | Admitting: Certified Registered"

## 2020-11-22 ENCOUNTER — Ambulatory Visit (HOSPITAL_COMMUNITY)
Admission: RE | Admit: 2020-11-22 | Discharge: 2020-11-22 | Disposition: A | Discharge status: Skilled Nursing Facility | Payer: Medicare Other | Source: Ambulatory Visit | Attending: Neurosurgery | Admitting: Neurosurgery

## 2020-11-22 ENCOUNTER — Other Ambulatory Visit: Payer: Self-pay

## 2020-11-22 DIAGNOSIS — Z96642 Presence of left artificial hip joint: Secondary | ICD-10-CM | POA: Diagnosis not present

## 2020-11-22 DIAGNOSIS — F0150 Vascular dementia without behavioral disturbance: Secondary | ICD-10-CM | POA: Diagnosis not present

## 2020-11-22 DIAGNOSIS — Z20822 Contact with and (suspected) exposure to covid-19: Secondary | ICD-10-CM | POA: Insufficient documentation

## 2020-11-22 DIAGNOSIS — D469 Myelodysplastic syndrome, unspecified: Secondary | ICD-10-CM | POA: Diagnosis not present

## 2020-11-22 DIAGNOSIS — Z886 Allergy status to analgesic agent status: Secondary | ICD-10-CM | POA: Diagnosis not present

## 2020-11-22 DIAGNOSIS — Z79899 Other long term (current) drug therapy: Secondary | ICD-10-CM | POA: Insufficient documentation

## 2020-11-22 DIAGNOSIS — R55 Syncope and collapse: Secondary | ICD-10-CM | POA: Diagnosis not present

## 2020-11-22 DIAGNOSIS — Z4542 Encounter for adjustment and management of neuropacemaker (brain) (peripheral nerve) (spinal cord): Secondary | ICD-10-CM | POA: Diagnosis not present

## 2020-11-22 DIAGNOSIS — G20 Parkinson's disease: Secondary | ICD-10-CM | POA: Insufficient documentation

## 2020-11-22 DIAGNOSIS — E43 Unspecified severe protein-calorie malnutrition: Secondary | ICD-10-CM | POA: Diagnosis not present

## 2020-11-22 DIAGNOSIS — J189 Pneumonia, unspecified organism: Secondary | ICD-10-CM | POA: Diagnosis not present

## 2020-11-22 DIAGNOSIS — M48062 Spinal stenosis, lumbar region with neurogenic claudication: Secondary | ICD-10-CM | POA: Diagnosis not present

## 2020-11-22 DIAGNOSIS — K219 Gastro-esophageal reflux disease without esophagitis: Secondary | ICD-10-CM | POA: Diagnosis not present

## 2020-11-22 HISTORY — PX: SUBTHALAMIC STIMULATOR BATTERY REPLACEMENT: SHX5405

## 2020-11-22 LAB — BASIC METABOLIC PANEL
Anion gap: 4 — ABNORMAL LOW (ref 5–15)
BUN: 18 mg/dL (ref 8–23)
CO2: 29 mmol/L (ref 22–32)
Calcium: 8.7 mg/dL — ABNORMAL LOW (ref 8.9–10.3)
Chloride: 103 mmol/L (ref 98–111)
Creatinine, Ser: 0.72 mg/dL (ref 0.61–1.24)
GFR, Estimated: >60 mL/min (ref >60)
Glucose, Bld: 103 mg/dL — ABNORMAL HIGH (ref 70–99)
Potassium: 4.4 mmol/L (ref 3.5–5.1)
Sodium: 136 mmol/L (ref 135–145)

## 2020-11-22 LAB — CBC
HCT: 27.7 % — ABNORMAL LOW (ref 39.0–52.0)
Hemoglobin: 9 g/dL — ABNORMAL LOW (ref 13.0–17.0)
MCH: 37.7 pg — ABNORMAL HIGH (ref 26.0–34.0)
MCHC: 32.5 g/dL (ref 30.0–36.0)
MCV: 115.9 fL — ABNORMAL HIGH (ref 80.0–100.0)
Platelets: 270 10*3/uL (ref 150–400)
RBC: 2.39 MIL/uL — ABNORMAL LOW (ref 4.22–5.81)
RDW: 16.9 % — ABNORMAL HIGH (ref 11.5–15.5)
WBC: 6.6 10*3/uL (ref 4.0–10.5)
nRBC: 0 % (ref 0.0–0.2)

## 2020-11-22 LAB — SARS CORONAVIRUS 2 BY RT PCR (HOSPITAL ORDER, PERFORMED IN ~~LOC~~ HOSPITAL LAB): SARS Coronavirus 2: NEGATIVE

## 2020-11-22 LAB — SURGICAL PCR SCREEN
MRSA, PCR: NEGATIVE
Staphylococcus aureus: POSITIVE — AB

## 2020-11-22 SURGERY — SUBTHALAMIC STIMULATOR BATTERY REPLACEMENT
Anesthesia: General | Site: Chest

## 2020-11-22 MED ORDER — BUPIVACAINE HCL (PF) 0.5 % IJ SOLN
INTRAMUSCULAR | Status: DC | PRN
  Administered 2020-11-22: 6.5 mL

## 2020-11-22 MED ORDER — VANCOMYCIN HCL 1000 MG IV SOLR
INTRAVENOUS | Status: AC
  Filled 2020-11-22: qty 1000

## 2020-11-22 MED ORDER — PROPOFOL 10 MG/ML IV BOLUS
INTRAVENOUS | Status: AC
  Filled 2020-11-22: qty 20

## 2020-11-22 MED ORDER — BUPIVACAINE HCL (PF) 0.5 % IJ SOLN
INTRAMUSCULAR | Status: AC
  Filled 2020-11-22: qty 30

## 2020-11-22 MED ORDER — 0.9 % SODIUM CHLORIDE (POUR BTL) OPTIME
TOPICAL | Status: DC | PRN
  Administered 2020-11-22: 1000 mL

## 2020-11-22 MED ORDER — LACTATED RINGERS IV SOLN: INTRAVENOUS | Status: DC

## 2020-11-22 MED ORDER — LIDOCAINE-EPINEPHRINE 1 %-1:100000 IJ SOLN
INTRAMUSCULAR | Status: AC
  Filled 2020-11-22: qty 1

## 2020-11-22 MED ORDER — VANCOMYCIN HCL 1000 MG IV SOLR
INTRAVENOUS | Status: DC | PRN
  Administered 2020-11-22: 1000 mg via TOPICAL

## 2020-11-22 MED ORDER — HYDROMORPHONE HCL 1 MG/ML IJ SOLN: 0.2500 mg | INTRAMUSCULAR | Status: DC | PRN

## 2020-11-22 MED ORDER — LIDOCAINE-EPINEPHRINE 1 %-1:100000 IJ SOLN
INTRAMUSCULAR | Status: DC | PRN
  Administered 2020-11-22: 6.5 mL

## 2020-11-22 MED ORDER — BACITRACIN ZINC 500 UNIT/GM EX OINT
TOPICAL_OINTMENT | CUTANEOUS | Status: AC
  Filled 2020-11-22: qty 28.35

## 2020-11-22 MED ORDER — VANCOMYCIN HCL IN DEXTROSE 1-5 GM/200ML-% IV SOLN
1000.0000 mg | INTRAVENOUS | Status: AC
  Administered 2020-11-22: 1000 mg via INTRAVENOUS
  Filled 2020-11-22: qty 200

## 2020-11-22 MED ORDER — CHLORHEXIDINE GLUCONATE 0.12 % MT SOLN
15.0000 mL | Freq: Once | OROMUCOSAL | Status: AC
  Administered 2020-11-22: 15 mL via OROMUCOSAL
  Filled 2020-11-22: qty 15

## 2020-11-22 MED ORDER — ORAL CARE MOUTH RINSE: 15.0000 mL | Freq: Once | OROMUCOSAL | Status: AC

## 2020-11-22 MED ORDER — FENTANYL CITRATE (PF) 250 MCG/5ML IJ SOLN
INTRAMUSCULAR | Status: AC
  Filled 2020-11-22: qty 5

## 2020-11-22 MED ORDER — PROPOFOL 500 MG/50ML IV EMUL
INTRAVENOUS | Status: DC | PRN
  Administered 2020-11-22: 100 ug/kg/min via INTRAVENOUS

## 2020-11-22 MED ORDER — FENTANYL CITRATE (PF) 250 MCG/5ML IJ SOLN
INTRAMUSCULAR | Status: DC | PRN
  Administered 2020-11-22: 50 ug via INTRAVENOUS

## 2020-11-22 MED ORDER — CHLORHEXIDINE GLUCONATE CLOTH 2 % EX PADS: 6.0000 | MEDICATED_PAD | Freq: Once | CUTANEOUS | Status: DC

## 2020-11-22 MED ORDER — CHLORHEXIDINE GLUCONATE CLOTH 2 % EX PADS
6.0000 | MEDICATED_PAD | Freq: Once | CUTANEOUS | Status: DC
Start: 2020-11-22 — End: 2020-11-25

## 2020-11-22 MED ORDER — ONDANSETRON HCL 4 MG/2ML IJ SOLN
INTRAMUSCULAR | Status: DC | PRN
  Administered 2020-11-22: 4 mg via INTRAVENOUS

## 2020-11-22 SURGICAL SUPPLY — 38 items
ADH SKN CLS APL DERMABOND .7 (GAUZE/BANDAGES/DRESSINGS) ×1
CANISTER SUCT 3000ML PPV (MISCELLANEOUS) ×2 IMPLANT
CARTRIDGE OIL MAESTRO DRILL (MISCELLANEOUS) ×1 IMPLANT
COVER WAND RF STERILE (DRAPES) ×2 IMPLANT
DECANTER SPIKE VIAL GLASS SM (MISCELLANEOUS) ×2 IMPLANT
DERMABOND ADVANCED (GAUZE/BANDAGES/DRESSINGS) ×1
DERMABOND ADVANCED .7 DNX12 (GAUZE/BANDAGES/DRESSINGS) ×1 IMPLANT
DIFFUSER DRILL AIR PNEUMATIC (MISCELLANEOUS) ×2 IMPLANT
DRAPE LAPAROTOMY 100X72 PEDS (DRAPES) ×2 IMPLANT
DRSG OPSITE POSTOP 4X6 (GAUZE/BANDAGES/DRESSINGS) ×1 IMPLANT
DURAPREP 26ML APPLICATOR (WOUND CARE) ×2 IMPLANT
GAUZE 4X4 16PLY RFD (DISPOSABLE) IMPLANT
GLOVE BIO SURGEON STRL SZ8 (GLOVE) ×2 IMPLANT
GLOVE BIOGEL PI IND STRL 8.5 (GLOVE) ×1 IMPLANT
GLOVE BIOGEL PI INDICATOR 8.5 (GLOVE) ×1
GLOVE ECLIPSE 8.0 STRL XLNG CF (GLOVE) ×2 IMPLANT
GLOVE EXAM NITRILE XL STR (GLOVE) IMPLANT
GLOVE SRG 8 PF TXTR STRL LF DI (GLOVE) ×1 IMPLANT
GLOVE SURG UNDER POLY LF SZ8 (GLOVE) ×2
GOWN STRL REUS W/ TWL LRG LVL3 (GOWN DISPOSABLE) IMPLANT
GOWN STRL REUS W/ TWL XL LVL3 (GOWN DISPOSABLE) ×1 IMPLANT
GOWN STRL REUS W/TWL 2XL LVL3 (GOWN DISPOSABLE) ×2 IMPLANT
GOWN STRL REUS W/TWL LRG LVL3 (GOWN DISPOSABLE)
GOWN STRL REUS W/TWL XL LVL3 (GOWN DISPOSABLE) ×2
KIT BASIN OR (CUSTOM PROCEDURE TRAY) ×2 IMPLANT
KIT TURNOVER KIT B (KITS) ×2 IMPLANT
NDL HYPO 25X1 1.5 SAFETY (NEEDLE) ×1 IMPLANT
NEEDLE HYPO 25X1 1.5 SAFETY (NEEDLE) ×2 IMPLANT
NEUROSTIM OCTOPOLAR ~~LOC~~ 60X55 (Neuro Prosthesis/Implant) ×1 IMPLANT
NS IRRIG 1000ML POUR BTL (IV SOLUTION) ×2 IMPLANT
OIL CARTRIDGE MAESTRO DRILL (MISCELLANEOUS) ×2
PACK LAMINECTOMY NEURO (CUSTOM PROCEDURE TRAY) ×2 IMPLANT
PAD ARMBOARD 7.5X6 YLW CONV (MISCELLANEOUS) ×6 IMPLANT
SUT VIC AB 2-0 CP2 18 (SUTURE) ×2 IMPLANT
SUT VIC AB 3-0 SH 8-18 (SUTURE) ×2 IMPLANT
TOWEL GREEN STERILE (TOWEL DISPOSABLE) ×2 IMPLANT
TOWEL GREEN STERILE FF (TOWEL DISPOSABLE) ×2 IMPLANT
WATER STERILE IRR 1000ML POUR (IV SOLUTION) ×2 IMPLANT

## 2020-11-22 NOTE — Anesthesia Preprocedure Evaluation (Addendum)
Anesthesia Evaluation  Patient identified by MRN, date of birth, ID band Patient awake    Reviewed: Allergy & Precautions, NPO status , Patient's Chart, lab work & pertinent test results  Airway Mallampati: II  TM Distance: >3 FB     Dental   Pulmonary pneumonia,    breath sounds clear to auscultation       Cardiovascular negative cardio ROS   Rhythm:Regular Rate:Normal     Neuro/Psych PSYCHIATRIC DISORDERS Dementia    GI/Hepatic Neg liver ROS, GERD  ,  Endo/Other  negative endocrine ROS  Renal/GU Renal disease     Musculoskeletal  (+) Arthritis ,   Abdominal   Peds  Hematology  (+) anemia ,   Anesthesia Other Findings   Reproductive/Obstetrics                             Anesthesia Physical Anesthesia Plan  ASA: II  Anesthesia Plan: MAC   Post-op Pain Management:    Induction: Intravenous  PONV Risk Score and Plan: 3 and Ondansetron, Dexamethasone and Midazolam  Airway Management Planned: Nasal Cannula and Simple Face Mask  Additional Equipment:   Intra-op Plan:   Post-operative Plan:   Informed Consent: I have reviewed the patients History and Physical, chart, labs and discussed the procedure including the risks, benefits and alternatives for the proposed anesthesia with the patient or authorized representative who has indicated his/her understanding and acceptance.     Dental advisory given  Plan Discussed with: Anesthesiologist and CRNA  Anesthesia Plan Comments:        Anesthesia Quick Evaluation

## 2020-11-22 NOTE — Interval H&P Note (Deleted)
History and Physical Interval Note:  11/22/2020 12:35 PM  Casey Tucker  has presented today for surgery, with the diagnosis of End of life of deep brain stimulator.  The various methods of treatment have been discussed with the patient and family. After consideration of risks, benefits and other options for treatment, the patient has consented to  Procedure(s) with comments: Change implantable pulse generator battery (N/A) - 3C/RM 21 as a surgical intervention.  The patient's history has been reviewed, patient examined, no change in status, stable for surgery.  I have reviewed the patient's chart and labs.  Questions were answered to the patient's satisfaction.     Peggyann Shoals

## 2020-11-22 NOTE — Anesthesia Postprocedure Evaluation (Signed)
Anesthesia Post Note  Patient: Casey Tucker  Procedure(s) Performed: Change Implantable Pulse Generator Battery (N/A Chest)     Patient location during evaluation: PACU Anesthesia Type: General Level of consciousness: awake Pain management: pain level controlled Vital Signs Assessment: post-procedure vital signs reviewed and stable Respiratory status: spontaneous breathing Cardiovascular status: stable Postop Assessment: no apparent nausea or vomiting Anesthetic complications: no   No complications documented.  Last Vitals:  Vitals:   11/22/20 1430 11/22/20 1440  BP: (!) 120/52 (!) 128/55  Pulse: 81 81  Resp: 16 14  Temp:  36.5 C  SpO2: 100% 99%    Last Pain:  Vitals:   11/22/20 1440  TempSrc:   PainSc: 0-No pain                 Tayari Yankee

## 2020-11-22 NOTE — Brief Op Note (Signed)
11/22/2020  1:45 PM  PATIENT:  Casey Tucker  80 y.o. male  PRE-OPERATIVE DIAGNOSIS:  End of life of deep brain stimulator for Parkinson's Disease  POST-OPERATIVE DIAGNOSIS:  End of life of deep brain stimulator for Parkinson's Disease  PROCEDURE:  Procedure(s) with comments: Change implantable pulse generator battery (N/A) - 3C/RM 21 [place]  SURGEON:  Surgeon(s) and Role:    * Jordain Radin, MD - Primary  PHYSICIAN ASSISTANT:   ASSISTANTS: [place], RN   ANESTHESIA:   local and IV sedation  EBL:  2 mL   BLOOD ADMINISTERED:none  DRAINS: none   LOCAL MEDICATIONS USED:  MARCAINE    and LIDOCAINE   SPECIMEN:  No Specimen  DISPOSITION OF SPECIMEN:  N/A  COUNTS:  YES  TOURNIQUET:  * No tourniquets in log *  DICTATION: Patient has implanted subthalamic stimulator electrodes on the left and IPG, which is now depleted.  It was elected for patient to undergo IPG revision.  PROCEDURE: Patient was brought to the operating room and given intravenous sedation.  Left upper chest was prepped with betadine scrub and Duraprep.  Area of planned incision was infiltrated with lidocaine.  Prior incision was reopened and the old IPG was externalized. Extension was connected to new IPG which was placed in the pocket.  Wound was irrigated with vancomycin. Then irrigated once more.  Incision was closed with 2-0 Vicryl and 3-0 vicryl sutures and dressed with a sterile occlusive dressing of Dermabond and an occlusive dressing.  Counts were correct at the end of the case.  PLAN OF CARE: Discharge to home after PACU  PATIENT DISPOSITION:  PACU - hemodynamically stable.   Delay start of Pharmacological VTE agent (>24hrs) due to surgical blood loss or risk of bleeding: yes  

## 2020-11-22 NOTE — Transfer of Care (Signed)
Immediate Anesthesia Transfer of Care Note  Patient: Casey Tucker  Procedure(s) Performed: Change implantable pulse generator battery (N/A )  Patient Location: PACU  Anesthesia Type:MAC  Level of Consciousness: responds to stimulation  Airway & Oxygen Therapy: Patient Spontanous Breathing  Post-op Assessment: Report given to RN and Post -op Vital signs reviewed and stable  Post vital signs: Reviewed and stable  Last Vitals:  Vitals Value Taken Time  BP 98/53 11/22/20 1347  Temp    Pulse 66 11/22/20 1347  Resp 12 11/22/20 1347  SpO2 98 % 11/22/20 1347  Vitals shown include unvalidated device data.  Last Pain:  Vitals:   11/22/20 1103  TempSrc:   PainSc: 6       Patients Stated Pain Goal: 2 (16/10/96 0454)  Complications: No complications documented.

## 2020-11-22 NOTE — Op Note (Signed)
11/22/2020  1:45 PM  PATIENT:  Casey Tucker  80 y.o. male  PRE-OPERATIVE DIAGNOSIS:  End of life of deep brain stimulator for Parkinson's Disease  POST-OPERATIVE DIAGNOSIS:  End of life of deep brain stimulator for Parkinson's Disease  PROCEDURE:  Procedure(s) with comments: Change implantable pulse generator battery (N/A) - 3C/RM 21 Activa Edmonson  SURGEON:  Surgeon(s) and Role:    Erline Levine, MD - Primary  PHYSICIAN ASSISTANT:   ASSISTANTS: Poteat, RN   ANESTHESIA:   local and IV sedation  EBL:  2 mL   BLOOD ADMINISTERED:none  DRAINS: none   LOCAL MEDICATIONS USED:  MARCAINE    and LIDOCAINE   SPECIMEN:  No Specimen  DISPOSITION OF SPECIMEN:  N/A  COUNTS:  YES  TOURNIQUET:  * No tourniquets in log *  DICTATION: Patient has implanted subthalamic stimulator electrodes on the left and IPG, which is now depleted.  It was elected for patient to undergo IPG revision.  PROCEDURE: Patient was brought to the operating room and given intravenous sedation.  Left upper chest was prepped with betadine scrub and Duraprep.  Area of planned incision was infiltrated with lidocaine.  Prior incision was reopened and the old IPG was externalized. Extension was connected to new IPG which was placed in the pocket.  Wound was irrigated with vancomycin. Then irrigated once more.  Incision was closed with 2-0 Vicryl and 3-0 vicryl sutures and dressed with a sterile occlusive dressing of Dermabond and an occlusive dressing.  Counts were correct at the end of the case.  PLAN OF CARE: Discharge to home after PACU  PATIENT DISPOSITION:  PACU - hemodynamically stable.   Delay start of Pharmacological VTE agent (>24hrs) due to surgical blood loss or risk of bleeding: yes

## 2020-11-25 ENCOUNTER — Encounter (HOSPITAL_COMMUNITY): Payer: Self-pay | Admitting: Neurosurgery

## 2020-11-25 DIAGNOSIS — R55 Syncope and collapse: Secondary | ICD-10-CM | POA: Diagnosis not present

## 2020-11-27 ENCOUNTER — Telehealth: Payer: Self-pay | Admitting: Neurology

## 2020-11-27 ENCOUNTER — Other Ambulatory Visit: Payer: Self-pay | Admitting: Neurology

## 2020-11-27 NOTE — Telephone Encounter (Signed)
Patient's nurse at [facility] called to confirm next appointment of 12/13/20.   I read him Dr. Doristine Devoid recommendation and he will talk with the patient further and call back to cancel if the patient decides.

## 2020-11-27 NOTE — Telephone Encounter (Signed)
Nurse from [facility] called back and cancelled appointment 12/13/20 on behalf of the patient. FYI only.

## 2020-11-27 NOTE — Telephone Encounter (Signed)
I'm not going to argue with him over care status.  My job is to provide recommendations.  We had a LONG discussion with patient and daughter last visit and we discussed fully my recommendations. He doesn't need May appt unless something new happened.  I just saw him.   I know he had a battery change since then (I sent him for a new DBS battery) but when they implant another, they just reset it at settings I had him at previously

## 2020-11-27 NOTE — Telephone Encounter (Signed)
Patient needs a call back as soon as possible. I had a difficult time understanding him but here is what I think he said: He said he does not need nurse 24 hours a day even though he was told he does.  Patient requested an appointment 12/13/20 at 1:30 pm with Dr. Carles Collet to follow up on his DBS, he was scheduled.

## 2020-11-28 ENCOUNTER — Telehealth: Payer: Self-pay | Admitting: Neurology

## 2020-11-28 NOTE — Telephone Encounter (Signed)
No.  He knows (as per previous phone call yesterday and multiple discussions in person) that I recommend 24 hour per day care.

## 2020-11-28 NOTE — Telephone Encounter (Signed)
Patient called in and left a voicemail that was hard to understand called back and spoke with caregiver. He was wanting the order Dr. Carles Collet put in for him to have skilled nursing care reduced down to him only needing to have care 12 hours a day so he will be able to be at home with his wife who has dementia.

## 2020-11-28 NOTE — Telephone Encounter (Signed)
Please advise 

## 2020-11-29 NOTE — Telephone Encounter (Signed)
Pt recommended 24 hr per daycare by Dr. Carles Collet.

## 2020-12-02 NOTE — Telephone Encounter (Signed)
Patient called and left a voice mail requesting a call back.  He said [facility] may be able to help him with long-term care if it will meet Dr. Doristine Devoid recommendations.

## 2020-12-03 NOTE — Telephone Encounter (Signed)
Called and spoke to patients daughter karen. Informed her that her father (patient) has been calling in regards to Dr. Doristine Devoid recommendations of 24 hour care and also let her know that we cannot keep going back and forth with patient and Dr. Doristine Devoid recommendation for 24 hour care will not change. Patients daughter agrees and stated that she reminds her father daily that he needs the 24 hour care and she will speak with her father again in regards to the recommended 24 hour care and let us know if they need anything from Dr. Carles Collet.

## 2020-12-03 NOTE — Telephone Encounter (Signed)
Per Dr. Carles Collet. Need to call daughter [name] who is listed on DPR. Need to inform daughter that patient has been calling a lot in regards to Dr. Doristine Devoid 24 hour care that is recommended. Dr. Carles Collet would like to ask daughter how we can assist? And also let her know that we cannot keep going back and forth with patient and Dr. Doristine Devoid recommendation for 24 hour care will not change.

## 2020-12-06 DIAGNOSIS — N3281 Overactive bladder: Secondary | ICD-10-CM | POA: Diagnosis not present

## 2020-12-06 DIAGNOSIS — G20 Parkinson's disease: Secondary | ICD-10-CM | POA: Diagnosis not present

## 2020-12-06 DIAGNOSIS — D469 Myelodysplastic syndrome, unspecified: Secondary | ICD-10-CM | POA: Diagnosis not present

## 2020-12-06 DIAGNOSIS — R0982 Postnasal drip: Secondary | ICD-10-CM | POA: Diagnosis not present

## 2020-12-06 DIAGNOSIS — E43 Unspecified severe protein-calorie malnutrition: Secondary | ICD-10-CM | POA: Diagnosis not present

## 2020-12-06 DIAGNOSIS — M48062 Spinal stenosis, lumbar region with neurogenic claudication: Secondary | ICD-10-CM | POA: Diagnosis not present

## 2020-12-06 DIAGNOSIS — D649 Anemia, unspecified: Secondary | ICD-10-CM | POA: Diagnosis not present

## 2020-12-09 DIAGNOSIS — W19XXXA Unspecified fall, initial encounter: Secondary | ICD-10-CM | POA: Diagnosis not present

## 2020-12-09 DIAGNOSIS — M549 Dorsalgia, unspecified: Secondary | ICD-10-CM | POA: Diagnosis not present

## 2020-12-09 DIAGNOSIS — M545 Low back pain, unspecified: Secondary | ICD-10-CM | POA: Diagnosis not present

## 2020-12-13 ENCOUNTER — Encounter: Payer: Medicare Other | Admitting: Neurology

## 2020-12-14 NOTE — H&P (Signed)
Patient ID:   [id number] Patient: Casey Tucker  Date of Birth: October 02, 1940 Visit Type: Office Visit   Date: 10/28/2020 02:00 PM Provider: Marchia Meiers. Vertell Limber MD   This 80 year old male presents for End of battery life DBS.  HISTORY OF PRESENT ILLNESS: 1.  End of battery life DBS  Varun Rawl is a 80 year old male who was last seen in the office in September 2018 for replacement his deep brain stimulator battery. He was diagnosed with Parkinson's by Dr. Bernardo Heater in 2005. Per the patient records, his deep brain stimulator was placed at [facility] in August of 2014. The patient reports that he has a significant benefit from his deep brain stimulator and has no complaints at this time.  Interrogation of device reveals left STN placement, date of implant March 14, 2013; device at [facility]. 2.57 volts with EOS 10 weeks.  History:  Parkinson's diagnosis 2005 Surgical history:  Left THR x2 2004, DBS 2014 at [facility]       PAST MEDICAL/SURGICAL HISTORY:   (Reviewed, updated)   Disease/disorder Onset Date Management Date Comments   DBS placement 2014    Hip replacement 2004  Parkinson disease        Family History:  (Reviewed, updated) Relationship Family Member Name Deceased Age at Death Condition Onset Age Cause of Death     Family history of Emphysema  N    Social History:  (Reviewed, updated) Tobacco use reviewed. Preferred language is [place].   Tobacco use status: Current non-smoker. Smoking status: Never smoker.  SMOKING STATUS Type Smoking Status Usage Per Day Years Used Total Pack Years  Never smoker         MEDICATIONS: (added, continued or stopped this visit) Started Medication Directions Instruction Stopped  amantidine 100 ORAL CAPSULE     carbidopa 10 mg-levodopa 100 mg tablet take 1 tablet by oral route 3 times every day    clonazepam 0.5 mg tablet take 1 tablet by oral route  every day    cyclobenzaprine 10 mg  tablet take 1 tablet by oral route 3 times every day    entacapone 200 mg tablet take 1 tablet by oral route 3 times every day in combination with carbidopa and levodopa    Mirapex 1 mg tablet take 1 tablet by oral route  every day      ALLERGIES: Ingredient Reaction Medication Name Comment CELECOXIB skin turns yellow Celebrex   Reviewed, no changes.    PHYSICAL EXAM:  Vitals Date Temp F BP Pulse Ht In Wt Lb BMI BSA Pain Score 10/28/2020  147/67 68 73 151 19.92  7/10   PHYSICAL EXAM Details General Level of Distress: no acute distress Overall Appearance: normal  Head and Face  Right Left  Fundoscopic Exam:  normal normal    Cardiovascular Cardiac: regular rate and rhythm without murmur  Right Left  Carotid Pulses: normal normal  Respiratory Lungs: clear to auscultation  Neurological Orientation: normal Recent and Remote Memory: normal Attention Span and Concentration:   normal Language: normal Fund of Knowledge: normal  Right Left Sensation: normal normal Upper Extremity Coordination: normal normal  Lower Extremity Coordination: normal normal  Musculoskeletal Gait and Station: normal  Right Left Upper Extremity Muscle Strength: normal normal Lower Extremity Muscle Strength: normal normal Upper Extremity Muscle Tone:  normal normal Lower Extremity Muscle Tone: normal normal   Motor Strength Upper and lower extremity motor strength was tested in the clinically pertinent muscles.     Deep Tendon Reflexes  Right Left  Biceps: normal normal Triceps: normal normal Brachioradialis: normal normal Patellar: normal normal Achilles: normal normal  Cranial Nerves II. Optic Nerve/Visual Fields: normal III. Oculomotor: normal IV. Trochlear: normal V. Trigeminal: normal VI. Abducens: normal VII. Facial: normal VIII. Acoustic/Vestibular: normal IX. Glossopharyngeal: normal X. Vagus: normal XI. Spinal  Accessory: normal XII. Hypoglossal: normal  Motor and other Tests Lhermittes: negative Rhomberg: negative Pronator drift: absent     Right Left Hoffman's: normal normal Clonus: normal normal Babinski: normal normal   Additional Findings:  Left chest IPG is well-healed with no apparent complications     IMPRESSION:  [employer] [place] [address] IPG for Left STN DBS with EOS 10 weeks.  Currently working with Dr. Carles Collet, who will reprogram device after IPG has been changed.  Risks and benefits of the procedure were discussed in detail with the patient and he wishes to proceed.  PLAN: Proceed with DBS battery replacement on 11/08/2020 at [facility].   Assessment/Plan  # Detail Type Description  1. Assessment End of battery life of deep brain stimulator (Z45.42).     2. Assessment Parkinsons (G20).                   Provider:  Marchia Meiers. Vertell Limber MD  10/29/2020 09:12 AM    Dictation edited by: Fenton Malling, NP    CC Providers: [place] [place] [address] [place],  [place]  [id number]   Rebecca Tat  [address] [place], [place] [address]               Electronically signed by Fenton Malling NP on 10/29/2020 09:13 AM  on behalf of Marchia Meiers. Vertell Limber MD

## 2020-12-24 NOTE — Telephone Encounter (Signed)
Kennyth Arnold, FNP  You 2 minutes ago (3:34 PM)     We can send an order to the medical supply company for a hospital bed. I am not sure how much is covered by insurance. Will you arrange the order with the medical supply company?

## 2020-12-25 NOTE — Telephone Encounter (Signed)
Thank you for the update!

## 2020-12-25 NOTE — Telephone Encounter (Signed)
[name] DPR signed left v/m as FYI that pts plans are presently put on hold; pt may decide to stay at [facility]. [name] request a pause put on previous information until pt decides definitely what pt is going to do. [name] left her contact phone # incase we need to speak with [name]. Sending note to Dr Silvio Pate and Larene Beach CMA.

## 2020-12-26 NOTE — Telephone Encounter (Signed)
[name] (daughter) called triage line.  She states her dad is currently at [facility] but they have decided to take patient home and [facility] will providing 24/7 live in home care. She is asking for Dr. Silvio Pate to start the process to have patient discharged from [facility]. She is aware that Dr. Silvio Pate is out of the office this week.  She states she would be happy if this could be done by next Friday. She is also requesting order for hospital bed.

## 2020-12-26 NOTE — Telephone Encounter (Signed)
Spoke to [place] by phone. She is saying Dr Silvio Pate will have to D/C him from [facility]. She is aware this may not happen until early next week. I explained the face to face encounter needed for the hospital bed. She understands the bed may not be available when he goes home, which is fine. Just wants his 24/7 care set up as required. She has all documentation if Dr Silvio Pate needs it about [facility] care agency.

## 2020-12-27 ENCOUNTER — Other Ambulatory Visit: Payer: Self-pay | Admitting: Neurology

## 2020-12-27 NOTE — Telephone Encounter (Signed)
Spoke to Casey Tucker. She has gotten in touch with Casey Tucker and everything is moving forward. This message started as a Pharmacist, community message

## 2020-12-30 ENCOUNTER — Other Ambulatory Visit: Payer: Self-pay | Admitting: Internal Medicine

## 2021-01-06 ENCOUNTER — Telehealth: Payer: Self-pay

## 2021-01-06 NOTE — Telephone Encounter (Signed)
I will stop and see him at [facility] tomorrow afternoon

## 2021-01-06 NOTE — Telephone Encounter (Signed)
Pt called the office asking to speak to Dr Silvio Pate. I advised him that Dr Silvio Pate was out of the office and was at [facility]. I asked was he at home now and he said he was at [facility]. I advised him that if he had a problem he would need to speak to someone at [facility]. He asked if Dr Silvio Pate would call him.

## 2021-01-07 DIAGNOSIS — G20 Parkinson's disease: Secondary | ICD-10-CM | POA: Diagnosis not present

## 2021-01-07 DIAGNOSIS — D469 Myelodysplastic syndrome, unspecified: Secondary | ICD-10-CM | POA: Diagnosis not present

## 2021-01-07 DIAGNOSIS — F39 Unspecified mood [affective] disorder: Secondary | ICD-10-CM | POA: Diagnosis not present

## 2021-01-07 DIAGNOSIS — F0150 Vascular dementia without behavioral disturbance: Secondary | ICD-10-CM | POA: Diagnosis not present

## 2021-01-07 DIAGNOSIS — M48061 Spinal stenosis, lumbar region without neurogenic claudication: Secondary | ICD-10-CM | POA: Diagnosis not present

## 2021-01-09 DIAGNOSIS — Z872 Personal history of diseases of the skin and subcutaneous tissue: Secondary | ICD-10-CM | POA: Diagnosis not present

## 2021-01-09 DIAGNOSIS — D2271 Melanocytic nevi of right lower limb, including hip: Secondary | ICD-10-CM | POA: Diagnosis not present

## 2021-01-09 DIAGNOSIS — Z85828 Personal history of other malignant neoplasm of skin: Secondary | ICD-10-CM | POA: Diagnosis not present

## 2021-01-09 DIAGNOSIS — X32XXXA Exposure to sunlight, initial encounter: Secondary | ICD-10-CM | POA: Diagnosis not present

## 2021-01-09 DIAGNOSIS — D2262 Melanocytic nevi of left upper limb, including shoulder: Secondary | ICD-10-CM | POA: Diagnosis not present

## 2021-01-09 DIAGNOSIS — L570 Actinic keratosis: Secondary | ICD-10-CM | POA: Diagnosis not present

## 2021-01-09 DIAGNOSIS — D2261 Melanocytic nevi of right upper limb, including shoulder: Secondary | ICD-10-CM | POA: Diagnosis not present

## 2021-01-09 DIAGNOSIS — D225 Melanocytic nevi of trunk: Secondary | ICD-10-CM | POA: Diagnosis not present

## 2021-01-09 DIAGNOSIS — L821 Other seborrheic keratosis: Secondary | ICD-10-CM | POA: Diagnosis not present

## 2021-01-29 ENCOUNTER — Telehealth: Payer: Self-pay | Admitting: *Deleted

## 2021-01-29 NOTE — Telephone Encounter (Signed)
Patient left a voicemail stating that he is at [facility]. The message was hard to understand. Patient stated that he wants to go home and has had some test done.  Called the number on file and spoke to patient's wife. Patient's wife stated that she is not sure what he wants, but will talk with him sometime today. Advised Mrs. Crichlow that this message will go back to Dr. Silvio Pate. Mrs. Chapple stated that she thinks that Dr. Silvio Pate has seen her husband recently and if not she would appreciate him seeing him the next time that he goes to [facility].

## 2021-01-29 NOTE — Telephone Encounter (Signed)
He gets confused and keeps asking to go home---but no plan exists for this (we have explored all options with Education officer, museum and daughter).  I will talk to him when I am there tomorrow and suggest that calling the office is not helpful

## 2021-01-30 ENCOUNTER — Other Ambulatory Visit: Payer: Self-pay | Admitting: Neurology

## 2021-02-03 NOTE — Telephone Encounter (Signed)
He speaks to me every time I see him which is 2-3 times a week (including this morning). I have told him that he needs to stay and he is moving to a long term bed in the skilled facility

## 2021-02-03 NOTE — Telephone Encounter (Signed)
[name] pts daughter left v/m that pt continues to talk about wanting to go home with home health. [name] and her sister are aware that [facility] is the best place for their father to be at but [name] left in v/m that if pt decides to go to home and pt can arrange home health that [name] and her sister approve whatever their father decides. [name] wanted FYI to Dr Silvio Pate.

## 2021-02-05 ENCOUNTER — Other Ambulatory Visit: Payer: Self-pay | Admitting: Neurology

## 2021-02-07 ENCOUNTER — Telehealth: Payer: Self-pay | Admitting: Neurology

## 2021-02-07 ENCOUNTER — Ambulatory Visit (INDEPENDENT_AMBULATORY_CARE_PROVIDER_SITE_OTHER): Payer: Medicare Other | Admitting: Neurology

## 2021-02-07 ENCOUNTER — Other Ambulatory Visit: Payer: Self-pay

## 2021-02-07 DIAGNOSIS — K117 Disturbances of salivary secretion: Secondary | ICD-10-CM

## 2021-02-07 MED ORDER — RIMABOTULINUMTOXINB 5000 UNIT/ML IM SOLN
5000.0000 [IU] | Freq: Once | INTRAMUSCULAR | Status: AC
  Administered 2021-02-07: 5000 [IU] via INTRAMUSCULAR

## 2021-02-07 NOTE — Telephone Encounter (Signed)
Pt's daughter called in stating the patient is very worried about having to do the 24 hour care for financial reasons. They were wondering if it's possible to do waking hours only? She stated the patient is miserable where he is and cries daily. She is worried for his quality of life. She apologizes for him calling us all the time. She would like to have a conversation with Dr. Carles Collet about this coming up soon?

## 2021-02-07 NOTE — Telephone Encounter (Signed)
Pt asked me multiple times about this today and I continue to recommend 24 hour per day care.  I don't care if its in the home or at [facility], where he is currently, or elsewhere.  The biggest issue is that he is separated from his wife and I think it would help if they could get him and wife together (perhaps both at [facility]??). In any case, I do think that he needs someone around because of falls and cognitive status

## 2021-02-07 NOTE — Telephone Encounter (Signed)
Pt wanted to keep his next Botox appt Patient wants to know if the 24/7 nursing is needed and important for him to have it is costing a lot of money,   Please call

## 2021-02-07 NOTE — Procedures (Signed)
Botulinum Clinic    History:  Diagnosis: Sialorrhea    Result History  Wasn't sure helping  Consent obtained from: The patient The patient was educated on the botulinum toxin the black blox warning and given a copy of the botox patient medication guide.  The patient understands that this warning states that there have been reported cases of the Botox extending beyond the injection site and creating adverse effects, similar to those of botulism. This included loss of strength, trouble walking, hoarseness, trouble saying words clearly, loss of bladder control, trouble breathing, trouble swallowing, diplopia, blurry vision and ptosis. Most of the distant spread of Botox was happening in patients, primarily children, who received medication for spasticity or for cervical dystonia. The patient expressed understanding and desire to proceed.     Injections  Location Left  Right Units Number of sites  Submandibular gland 250 250 500 1 per side  Parotid 2250 2250 2500 1 per side  TOTAL UNITS:     5000      Type of Toxin: Myobloc type B As ordered and injected IM at today's visit Total Units: 5000  Discarded Units: 0  Needle drawback with each injection was free of blood. Pt tolerated procedure well without complications.   Reinjection is anticipated in 3 months.  Clinical comment:   Pt asked many times during the visit if I could remove my recommendation for 24 hours/day care.  I told him that I could not.  He was frustrated by that recommendation.  I told him that I did not care if he received that 24 hours/day care within the home for out of the home, but my recommendation was 24 hour/day care but nonetheless.  He did not want to accept that recommendation and continued to ask myself and multiple staff members.  Separately, following Botox, the patient stated that he really did not want to proceed with that next visit, since he did not think that the last one was helpful (although he did want to  do it today).  Therefore, we decided to cancel him off of the Botox schedule.  However, after he did that, he went to the front staff and requested that he be placed back on the Botox schedule.

## 2021-02-08 ENCOUNTER — Telehealth: Payer: Self-pay | Admitting: Neurology

## 2021-02-10 ENCOUNTER — Encounter: Payer: Medicare Other | Admitting: Neurology

## 2021-02-10 NOTE — Telephone Encounter (Signed)
Called and spoke to patients daughter and informed her of Dr. Doristine Devoid message and recommendations of 24 hour care. Patients daughter verbalized understanding and stated that they are going to work on getting 24 hour care at home for patient and his wife.   Patients daughter had no further questions or concerns.

## 2021-02-10 NOTE — Telephone Encounter (Signed)
I need to investigate why his levodopa was refused by [place] [facility].  I put him on parcopa last time but looks like he may be on regular carbidopa/levodopa 25/100 again.  Please find out which he is on so we can refill appropriately

## 2021-02-11 ENCOUNTER — Encounter: Payer: Medicare Other | Admitting: Neurology

## 2021-02-11 NOTE — Telephone Encounter (Signed)
Who is prescribing it this way?  [facility] requires orders for RX's and I didn't prescribe it that way.  If I am prescribing it, he should be taking 2 tablets at 7:30 AM/1 tablet at 10 AM/1 tablet at 1 PM/1 tablet at 4 PM/1 tablet at 7 PM

## 2021-02-11 NOTE — Telephone Encounter (Signed)
Sharkey and spoke to the nurse who informed me that patient is taking the regular Carbidopa/Levodopa 25/100. He takes this medication 1 tablet 3 times daily, 2 at bedtime and may take additional tablet prn if needed.

## 2021-02-12 NOTE — Telephone Encounter (Signed)
Patient called and left a message following up with the nurse about this.

## 2021-02-13 NOTE — Telephone Encounter (Signed)
[place] at [phone] and spoke to Nurse Alyse Low and informed her that I am calling to check how patient has been taking his medication because what I was told yesterday by the another nurse was not matching up to what Dr. Carles Collet prescribed for his carbidopa levodopa.  Patient should be taking 2 tablets at 7:30 AM/1 tablet at 10 AM/1 tablet at 1 PM/1 tablet at 4 PM/1 tablet at 7 PM  Christy at [facility] informed me that patient has been taking his Carbidopa/Levodopa 1 tablet at 8am/12pm/2pm another at bedtime. I informed Alyse Low that that medication is not being given to patient correctly and that I was also advised incorrectly yesterday as well when I called in regards to his Caribidopa/Levodopa. I informed the nurse that he should be should be taking as prescribed 2 tablets at 7:30 AM/1 tablet at 10 AM/1 tablet at 1 PM/1 tablet at 4 PM/1 tablet at 7 PM. Alyse Low stated they aren't giving it to him that way. I asked Alyse Low who made these changes? She infomred me that she doesn't know who. I asked her why I am not able to know this as it should be documented who made changes to a patietns medication that was not approved by Dr. Carles Collet. I informed Alyse Low that [facility] requires physician orders for RX's refills and medication changes. Christy then informed me that all she saw in the file was that the changes were done by a "First shift nurse on March 21st." I informed Alyse Low they should know who made these changes because this medication is being given to patient wrong and this is an issue. Alyse Low stated that patient was transfered to them from another "hub" and that's how he came to them.

## 2021-02-13 NOTE — Telephone Encounter (Signed)
Looks like physician at [facility] must be changing the meds then.  They can refill meds.  Please let daughter, [name], know that Parkinsons Disease med dosing is not what we are ordering and [facility] not giving [place] answers for who is changing his meds so we are declining refill right now.

## 2021-02-13 NOTE — Telephone Encounter (Signed)
Patient called to check about this matter. He is now aware Dr. Carles Collet and [facility] are still working on resolving the matter.

## 2021-02-14 MED ORDER — CARBIDOPA-LEVODOPA 25-100 MG PO TABS: ORAL_TABLET | ORAL | 1 refills | Status: DC

## 2021-02-14 NOTE — Telephone Encounter (Signed)
Robin from [facility] called regarding Casey Tucker's carbidopa/levo. He would like a call back.

## 2021-02-14 NOTE — Telephone Encounter (Signed)
Called patients daughter and informed her that we have been having issues getting answers from [facility] in regards to patients Carbidopa Levodopa. Informed [name] that no one there could tell us who changed the medication dosing and they have been giving the medication wrong to patient.   Patients daughter informed me that her dad has been discharged from [facility] and is now at home beginning today with 24 hour care. Patients daughter said after hearing this she is glad that he is out of [facility] because her dad always complained he felt like they were giving him his medications wrong.   Patients daughter wanted to know if Dr. Carles Collet could now send a Rx to [place] because patient will now be doing bubble packs. The bubble packs will allow the nurse to give the patient the pack so that he may take it himself. The nurse is not allowed to dispense his medication. I informed [name] that I will send Dr. Carles Collet a message and call her back once I hear back.

## 2021-02-14 NOTE — Telephone Encounter (Signed)
Patients carbidopa levodopa has been sent to pharmacy

## 2021-02-19 NOTE — Telephone Encounter (Signed)
Spoke to pt's daughter and pt to verify the med list from our office and [facility]. The only question was about gabapentin '300mg'$  vs '200mg'$  at bedtime. Said to see what Dr Silvio Pate thinks he should be doing. OV has been made for 03-13-21.

## 2021-02-19 NOTE — Telephone Encounter (Signed)
I left you a copy of the discharge summary. Send it for scanning after Find out if she is going to be able to bring him in (needs appt in the next couple of weeks)---or does he need a home visit (though that would probably be a while)

## 2021-02-20 NOTE — Telephone Encounter (Signed)
Faxed med list to [facility].

## 2021-02-20 NOTE — Telephone Encounter (Signed)
I will leave the '300mg'$  on and send the updated med list to [facility].

## 2021-02-20 NOTE — Telephone Encounter (Signed)
He was getting '200mg'$  at [facility] but I am okay if he thinks '300mg'$  is better

## 2021-02-21 NOTE — Progress Notes (Signed)
Assessment/Plan:   1.  Parkinsons Disease  -Status post left STN DBS at [facility] in August, 2014.  Battery changed in September, 2018 and on Nov 22, 2020            -Continue carbidopa/levodopa 25/100, 2 tablets at 7:30 AM/1 tablet at 10 AM/1 tablet at 1 PM/1 tablet at 4 PM/1 tablet at 7 PM.  Can take an extra if needed but not daily.  Told him no more than 7 tabls per day.  Told him if he is consistently taking more than I really need to know, but I do not want him doing this without my knowledge. 2.  PDD, advancing             -Patient did not like being in [facility] (mostly because he and wife were separated).  He is back at home.  He does not like having 24 hour/day care, but understands that is the compromise for being back at home. 3. Myelodysplastic syndrome  -Following with hematology/oncology 4.  Dysphagia, associated with Parkinson's disease  -Modified barium swallow while in the hospital in March, 2022, with evidence of moderate to severe aspiration risk.    -I would like to repeat that, but the patient states that he feels that he is clinically much better and would like to continue for now as it is. 5.  Sialorrhea  -Patient has received Myobloc on 2 different occasions.  He reports today that he thinks Myobloc has helped, but does not like coming here every 3 months.  For now, he has opted to cancel the future appointments and see how he does.  He can let me know if he changes his mind.  Subjective:   Casey Tucker was seen today in follow up for Parkinsons disease.  My previous records were reviewed prior to todays visit as well as outside records available to me.  Much has happened since our last visit.  Patient has now transitioned back to living at home with 24 hours/day care.  His medicines recently came up for renewal (just before he left [facility]) and it came to my attention that he was no longer on the [place] (requested by family last visit because of swallowing  issues) and the dosing of the medicine had been changed around significantly.  We called [facility] and we could not get any answer on why or when it had been changed around.  Nonetheless, when he went back home, family had requested that I get changed back to previous dosing.  He is back on regular levodopa as well as opposed to [place].  In regards to swallowing, pt  state that he is doing "much better."  After  I did his Botox last visit (July 22), the patient stated after the visit that he did not want to continue with the Botox injections.  I walked up to the front desk and went ahead and canceled his future Botox and by the time he got to the front desk, he rescheduled them.  Reports today that he honestly just didn't want to travel back and forth to [place] but asks today to hold/cancel his next botox.  He agrees that it is helping though.  His DBS battery was changed on May 6.  His caregiver brought him to the appointment but did not wish to come back to the room today.  Current prescribed movement disorder medications: Carbidopa/levodopa 25/100, 2 tablets at 7:30  AM/1 tablet at 10 AM/1 tablet at 1 PM/1 tablet at 4 PM/1 tablet at 7 PM (states that he often takes 7 per day) - he knew this dosing schedule without prompting  PREVIOUS MEDICATIONS: entacapone (d/c last visit d/t confusion)  ALLERGIES:   Allergies  Allergen Reactions   Celebrex [Celecoxib] Other (See Comments)    Per MAR   Demerol Other (See Comments)    Hallucinations    Amoxicillin Rash   Penicillins Rash    CURRENT MEDICATIONS:  Outpatient Encounter Medications as of 02/24/2021  Medication Sig   acetaminophen (TYLENOL) 325 MG tablet Take 650 mg by mouth every 6 (six) hours as needed (pain).   carbidopa-levodopa (SINEMET IR) 25-100 MG tablet Take 2 tablets at 7:30 AM/1 tablet at 10 AM/1 tablet at 1 PM/1 tablet at 4 PM/1 tablet at 7 PM   escitalopram (LEXAPRO) 10 MG tablet Take 10 mg by mouth daily.   feeding supplement (ENSURE  ENLIVE / ENSURE PLUS) LIQD Take 237 mLs by mouth 3 (three) times daily between meals. (Patient taking differently: Take 237 mLs by mouth 3 (three) times daily between meals. (0800, 1200 & 1730))   ferrous sulfate 325 (65 FE) MG tablet Take 325 mg by mouth every other day. (0900)   gabapentin (NEURONTIN) 100 MG capsule Take 100 mg by mouth 3 (three) times daily.  At 8am, 11am, and 4pm   gabapentin (NEURONTIN) 300 MG capsule Take 300 mg by mouth at bedtime. (2000)   ibuprofen (ADVIL) 400 MG tablet Take 400 mg by mouth every 12 (twelve) hours as needed (pain).   loratadine (CLARITIN) 10 MG tablet Take 10 mg by mouth at bedtime. (2000)   magnesium hydroxide (MILK OF MAGNESIA) 400 MG/5ML suspension Take 15 mLs by mouth daily as needed for mild constipation.   mirabegron ER (MYRBETRIQ) 25 MG TB24 tablet Take 25 mg by mouth daily. (0800)   sennosides-docusate sodium (SENOKOT-S) 8.6-50 MG tablet Take 2 tablets by mouth daily. (0900)   traMADol (ULTRAM) 50 MG tablet Take 1 tablet by mouth every 8 (eight) hours as needed.   No facility-administered encounter medications on file as of 02/24/2021.    Objective:   PHYSICAL EXAMINATION:    VITALS:   Vitals:   02/24/21 1420  BP: (!) 155/85  Pulse: 73  SpO2: 97%  Weight: 161 lb 12.8 oz (73.4 kg)  Height: '5\' 9"'$  (1.753 m)     GEN:  The patient appears stated age and is in NAD. HEENT:  Normocephalic, atraumatic.  The mucous membranes are moist. The superficial temporal arteries are without ropiness or tenderness.  Neurological examination:  Orientation: The patient is alert and oriented to person/place.  He generally knows his Parkinson's medicines very well, but can become somewhat confused about other things. Cranial nerves: There is good facial symmetry with facial hypomimia. The speech is fluent and dysarthric. Soft palate rises symmetrically and there is no tongue deviation. Hearing is intact to conversational tone. Sensation: Sensation is intact  to light touch throughout Motor: Strength is at least antigravity x4.  Movement examination: Tone: There is normal tone in the upper and lower extremities. Abnormal movements: none Coordination:  There is decremation with RAM's, with any form of RAMS, including alternating supination and pronation of the forearm, hand opening and closing, finger taps, heel taps and toe taps bilaterally Gait and Station: He pushes off of the chair to arise.  He has start hesitation.  He freezes in the doorway.  He festinate some, even with  a walker, but does fairly well in the hallway with a walker.  Turns en bloc. I have reviewed and interpreted the following labs independently    Chemistry      Component Value Date/Time   NA 136 11/22/2020 1042   NA 142 08/11/2016 1701   NA 139 08/15/2011 0535   K 4.4 11/22/2020 1042   K 3.8 08/15/2011 0535   CL 103 11/22/2020 1042   CL 101 08/15/2011 0535   CO2 29 11/22/2020 1042   CO2 29 08/15/2011 0535   BUN 18 11/22/2020 1042   BUN 18 08/11/2016 1701   BUN 12 08/15/2011 0535   CREATININE 0.72 11/22/2020 1042   CREATININE 0.73 08/15/2011 0535      Component Value Date/Time   CALCIUM 8.7 (L) 11/22/2020 1042   CALCIUM 7.6 (L) 08/15/2011 0535   ALKPHOS 46 10/03/2020 0429   AST 9 (L) 10/03/2020 0429   ALT 6 10/03/2020 0429   BILITOT 1.3 (H) 10/03/2020 0429   BILITOT 1.2 08/11/2016 1701       Lab Results  Component Value Date   WBC 6.6 11/22/2020   HGB 9.0 (L) 11/22/2020   HCT 27.7 (L) 11/22/2020   MCV 115.9 (H) 11/22/2020   PLT 270 11/22/2020    Lab Results  Component Value Date   TSH 4.029 10/03/2020     Total time spent on today's visit was 30 minutes, including both face-to-face time and nonface-to-face time.  Time included that spent on review of records (prior notes available to me/labs/imaging if pertinent), discussing treatment and goals, answering patient's questions and coordinating care.  This did not include DBS time.  Cc:  [name], MD

## 2021-02-24 ENCOUNTER — Encounter: Payer: Self-pay | Admitting: Neurology

## 2021-02-24 ENCOUNTER — Ambulatory Visit (INDEPENDENT_AMBULATORY_CARE_PROVIDER_SITE_OTHER): Payer: Medicare Other | Admitting: Neurology

## 2021-02-24 ENCOUNTER — Other Ambulatory Visit: Payer: Self-pay

## 2021-02-24 DIAGNOSIS — G20 Parkinson's disease: Secondary | ICD-10-CM | POA: Diagnosis not present

## 2021-02-24 NOTE — Procedures (Signed)
DBS Programming was performed.    Manufacturer of DBS device: [employer]onic  Total time spent programming was 7 minutes.  Device was on.  Soft start was confirmed to be on.  Impedences were checked and were within normal limits.  Battery was checked and was determined to be functioning normally and not near the end of life.  Final settings were as follows:   Active Contact Amplitude (mA) PW (ms) Frequency (hz) Side Effects Battery  Left Brain        01/29/20        Group A (active) 2-C+ 3.7 60 135  2.88  Group B (not active) 1-C+ 3.2 90 140    08/02/20 2-C+ 3.9 60 140  2.76  10/09/20 2-C+ 3.9 60 140  2.62  02/24/21 2-C+ 3.9 60 140  2.96                          Right Brain        n/a                 Pt shown today how to use remote.

## 2021-02-24 NOTE — Patient Instructions (Signed)
You have decided to hold on your botox for the drooling.  You can let us know if you start drooling again and we can get you back on the schedule again.    I would prefer to stay under 7 tablets of carbidopa/levodopa 25/100 per day.  If you rarely need an extra 1/2 tablet or 1 tablet its okay, but I don't want that consistently.

## 2021-03-12 ENCOUNTER — Ambulatory Visit: Payer: Medicare Other | Admitting: Internal Medicine

## 2021-03-18 ENCOUNTER — Other Ambulatory Visit: Payer: Self-pay | Admitting: Internal Medicine

## 2021-03-20 NOTE — Telephone Encounter (Signed)
Hailey called in and stated that he has bubble packs that are due on Saturday and they are trying to get them refilled ahead of time because a delay

## 2021-03-21 NOTE — Telephone Encounter (Signed)
Spoke with Ebony Hail at the [facility] and verified they received approval of the medications

## 2021-04-14 ENCOUNTER — Ambulatory Visit: Payer: Medicare Other | Admitting: Neurology

## 2021-04-18 ENCOUNTER — Other Ambulatory Visit: Payer: Self-pay | Admitting: Internal Medicine

## 2021-04-21 ENCOUNTER — Telehealth: Payer: Self-pay | Admitting: Oncology

## 2021-04-21 NOTE — Telephone Encounter (Signed)
Patient called to cancel his 6 month check up this week (Labs/MD). He stated that he has already requested to cancel this appointment once and he declined any rescheduling.   Routing to clinical team to make aware.

## 2021-04-24 ENCOUNTER — Inpatient Hospital Stay: Payer: Medicare Other | Admitting: Oncology

## 2021-04-24 ENCOUNTER — Inpatient Hospital Stay: Payer: Medicare Other

## 2021-05-09 ENCOUNTER — Ambulatory Visit: Payer: Medicare Other | Admitting: Neurology

## 2021-05-17 ENCOUNTER — Other Ambulatory Visit: Payer: Self-pay | Admitting: Neurology

## 2021-05-17 DIAGNOSIS — G20 Parkinson's disease: Secondary | ICD-10-CM

## 2021-05-19 ENCOUNTER — Other Ambulatory Visit: Payer: Self-pay

## 2021-05-19 DIAGNOSIS — G20 Parkinson's disease: Secondary | ICD-10-CM

## 2021-05-19 MED ORDER — CARBIDOPA-LEVODOPA 25-100 MG PO TABS: ORAL_TABLET | ORAL | 1 refills | Status: DC

## 2021-06-20 ENCOUNTER — Encounter: Payer: Self-pay | Admitting: Internal Medicine

## 2021-06-20 MED ORDER — GABAPENTIN 100 MG PO CAPS: 100.0000 mg | ORAL_CAPSULE | Freq: Every day | ORAL | 11 refills | Status: AC

## 2021-07-02 DIAGNOSIS — Z23 Encounter for immunization: Secondary | ICD-10-CM | POA: Diagnosis not present

## 2021-07-19 DIAGNOSIS — Z23 Encounter for immunization: Secondary | ICD-10-CM | POA: Diagnosis not present

## 2021-07-29 ENCOUNTER — Other Ambulatory Visit: Payer: Self-pay

## 2021-07-29 ENCOUNTER — Encounter: Payer: Self-pay | Admitting: Internal Medicine

## 2021-07-29 ENCOUNTER — Ambulatory Visit (INDEPENDENT_AMBULATORY_CARE_PROVIDER_SITE_OTHER): Payer: Medicare Other | Admitting: Internal Medicine

## 2021-07-29 VITALS — BP 104/70 | HR 69 | Temp 97.6°F | Ht 69.0 in | Wt 173.0 lb

## 2021-07-29 DIAGNOSIS — M48062 Spinal stenosis, lumbar region with neurogenic claudication: Secondary | ICD-10-CM | POA: Diagnosis not present

## 2021-07-29 DIAGNOSIS — G20 Parkinson's disease: Secondary | ICD-10-CM | POA: Diagnosis not present

## 2021-07-29 DIAGNOSIS — F39 Unspecified mood [affective] disorder: Secondary | ICD-10-CM | POA: Diagnosis not present

## 2021-07-29 DIAGNOSIS — R350 Frequency of micturition: Secondary | ICD-10-CM

## 2021-07-29 DIAGNOSIS — D469 Myelodysplastic syndrome, unspecified: Secondary | ICD-10-CM

## 2021-07-29 DIAGNOSIS — F0150 Vascular dementia without behavioral disturbance: Secondary | ICD-10-CM

## 2021-07-29 LAB — CBC
HCT: 23.5 % — CL (ref 39.0–52.0)
Hemoglobin: 7.9 g/dL — CL (ref 13.0–17.0)
MCHC: 33.7 g/dL (ref 30.0–36.0)
MCV: 123.2 fl — ABNORMAL HIGH (ref 78.0–100.0)
Platelets: 353 10*3/uL (ref 150.0–400.0)
RBC: 1.91 Mil/uL — ABNORMAL LOW (ref 4.22–5.81)
RDW: 14.7 % (ref 11.5–15.5)
WBC: 6.9 10*3/uL (ref 4.0–10.5)

## 2021-07-29 LAB — COMPREHENSIVE METABOLIC PANEL
ALT: 3 U/L (ref 0–53)
AST: 11 U/L (ref 0–37)
Albumin: 3.6 g/dL (ref 3.5–5.2)
Alkaline Phosphatase: 45 U/L (ref 39–117)
BUN: 18 mg/dL (ref 6–23)
CO2: 27 mEq/L (ref 19–32)
Calcium: 8.5 mg/dL (ref 8.4–10.5)
Chloride: 105 mEq/L (ref 96–112)
Creatinine, Ser: 0.79 mg/dL (ref 0.40–1.50)
GFR: 84.32 mL/min (ref >60.00)
Glucose, Bld: 91 mg/dL (ref 70–99)
Potassium: 3.5 mEq/L (ref 3.5–5.1)
Sodium: 138 mEq/L (ref 135–145)
Total Bilirubin: 1.2 mg/dL (ref 0.2–1.2)
Total Protein: 6.1 g/dL (ref 6.0–8.3)

## 2021-07-29 NOTE — Assessment & Plan Note (Signed)
He doesn't really even remember about this Will recheck CBC to see if stable

## 2021-07-29 NOTE — Assessment & Plan Note (Signed)
Anxiety is controlled with lexapro 10mg  daily

## 2021-07-29 NOTE — Assessment & Plan Note (Signed)
Stale functional issues Complicated sinemet regimen per Dr Carles Collet

## 2021-07-29 NOTE — Assessment & Plan Note (Signed)
Pain seems less troublesome On gabapentin 100mg  at bedtime

## 2021-07-29 NOTE — Progress Notes (Signed)
Subjective:    Patient ID: Casey Tucker, male    DOB: February 14, 1941, 81 y.o.   MRN: [id number]  HPI Here for follow up after leaving skilled nursing months ago Wilford Sports is here (she does M/T/W--- 11a-7p) He does have aides daily--but not more than the 8 hours  She helps care for him--plus some house work and food prep He feels it is working out He thinks daughter feels things are fine---but she has emailed concerns  Aide helps with shower--mostly for back He needs help with dressing She also helps with toileting ---pulling down pants, cleaning after stools  Not very steady--walks with regular walking Has had some falls---no sig injury  Eating well Has gained back some weight  Goes back to [facility] soon Needs DBS reprogrammed  Sleeps okay Only using 100mg  gabapentin at bedtime for neuropathy  Mood is okay  Hasn't seen hematology/oncology about MDS  Current Outpatient Medications on File Prior to Visit  Medication Sig Dispense Refill   acetaminophen (TYLENOL) 325 MG tablet Take 650 mg by mouth every 6 (six) hours as needed (pain).     carbidopa-levodopa (SINEMET IR) 25-100 MG tablet TAKE TWO TABLETS AT 7:30AM AND 1 TABLET AT 10AM  1PM  4PM  AND 7PM 540 tablet 1   carbidopa-levodopa (SINEMET IR) 25-100 MG tablet Take 2 tablets at 7:30 AM/1 tablet at 10 AM/1 tablet at 1 PM/1 tablet at 4 PM/1 tablet at 7 PM 540 tablet 1   escitalopram (LEXAPRO) 10 MG tablet TAKE ONE TABLET BY MOUTH EVERY DAY 31 tablet 11   feeding supplement (ENSURE ENLIVE / ENSURE PLUS) LIQD Take 237 mLs by mouth 3 (three) times daily between meals. (Patient taking differently: Take 237 mLs by mouth 3 (three) times daily between meals. (0800, 1200 & 1730)) 237 mL 12   ferrous sulfate 325 (65 FE) MG tablet TAKE ONE TABLET BY MOUTH EVERY OTHER DAY 45 tablet 4   gabapentin (NEURONTIN) 100 MG capsule Take 1 capsule (100 mg total) by mouth at bedtime. Takes the place of the 300mg  dose 30 capsule 11   ibuprofen  (ADVIL) 400 MG tablet Take 400 mg by mouth every 12 (twelve) hours as needed (pain).     loratadine (CLARITIN) 10 MG tablet TAKE ONE TABLET BY MOUTH EVERY DAY 100 tablet 3   magnesium hydroxide (MILK OF MAGNESIA) 400 MG/5ML suspension Take 15 mLs by mouth daily as needed for mild constipation. 355 mL 0   mirabegron ER (MYRBETRIQ) 25 MG TB24 tablet TAKE ONE TABLET BY MOUTH EVERY DAY 31 tablet 11   sennosides-docusate sodium (SENOKOT-S) 8.6-50 MG tablet Take 2 tablets by mouth daily. (0900)     No current facility-administered medications on file prior to visit.    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   Parkinson disease ([place]) 2004   Slowly progressive   Ruptured appendix teens    Past Surgical History:  Procedure Laterality Date   APPENDECTOMY     CATARACT EXTRACTION W/ INTRAOCULAR LENS  IMPLANT, BILATERAL  2017   DEEP BRAIN STIMULATOR PLACEMENT  8/14   L STN   DOPPLER ECHOCARDIOGRAPHY  01/10/2009   LV NML Mild LVH EF 60-65% aortic sclerosis w/0 stenosis    JOINT REPLACEMENT     left hip replacement 08/14/11/[facility]    SUBTHALAMIC STIMULATOR BATTERY REPLACEMENT Left 03/25/2017   Procedure: Deep Brain Stimulator battery replacement;  Surgeon: Erline Levine, MD;  Location: [place];  Service: Neurosurgery;  Laterality: Left;  left   SUBTHALAMIC STIMULATOR BATTERY REPLACEMENT N/A 11/22/2020   Procedure: Change Implantable Pulse Generator Battery;  Surgeon: Erline Levine, MD;  Location: [place];  Service: Neurosurgery;  Laterality: N/A;  3C/RM 21   TONSILLECTOMY     TOTAL HIP REVISION   08/27/2011   Procedure: TOTAL HIP REVISION;  Surgeon: Mauri Pole, MD;  Location: [facility];  Service: Orthopedics;  Laterality: Left;    Family History  Problem Relation Age of Onset   Arthritis Mother        knee replacement   COPD Father        emphysema, smoker   Heart disease Father        CHF   Healthy Sister    Alcohol abuse Paternal Uncle    Rheum arthritis Daughter     Social History   Socioeconomic History   Marital status: Married    Spouse name: Not on file   Number of children: 2   Years of education: Not on file   Highest education level: Doctorate  Occupational History   Occupation: Optometrist    Comment: medically retired  Tobacco Use   Smoking status: Never   Smokeless tobacco: Never  Scientific laboratory technician Use: Never used  Substance and Sexual Activity   Alcohol use: Not Currently    Alcohol/week: 7.0 standard drinks    Types: 7 Standard drinks or equivalent per week   Drug use: No   Sexual activity: Not on file  Other Topics Concern   Not on file  Social History Narrative   Married, wife lives at home patient stays at [facility]   2 daughters   Right handed   Has living will   Wife has health care POA---then daughters   Would still accept CPR--but no prolonged artificial means (ventilator or tube feeds)   Social Determinants of Health   Financial Resource Strain: Not on file  Food Insecurity: Not on file  Transportation Needs: Not on file  Physical Activity: Not on file  Stress: Not on file  Social Connections: Not on file  Intimate Partner Violence: Not on file   Review of Systems No chest pain No SOB Some nasal congestion. Feels ears are clogged as well Hasn't needed tramadol Gets some pain along hipline---past PT but done with this Bowels are slow--did go well yesterday (loose)     Objective:   Physical Exam Constitutional:      Appearance: Normal appearance.  HENT:     Ears:     Comments: Partial cerumen blockage distally  (only ~50%) Cardiovascular:     Rate and Rhythm: Normal rate and regular rhythm.     Heart sounds:    No gallop.     Comments: Soft systolic murmur Pulmonary:     Effort: Pulmonary effort is normal.     Breath sounds: Normal breath sounds. No wheezing or rales.  Abdominal:     Palpations: Abdomen is soft.     Tenderness: There is no abdominal tenderness.  Musculoskeletal:  Cervical back: Neck supple.     Comments: Fairly normal ROM in hips  Lymphadenopathy:     Cervical: No cervical adenopathy.  Skin:    Findings: No rash.  Neurological:     Mental Status: He is alert.     Comments: Sig bradykinesia           Assessment & Plan:

## 2021-07-29 NOTE — Assessment & Plan Note (Signed)
Extremely limited insight and judgement Daily aides to assist with ADLs Doesn't accept daughter's concerns about being at home

## 2021-07-29 NOTE — Assessment & Plan Note (Signed)
Seems to have some improvement with mirabegron 25 daily

## 2021-08-28 ENCOUNTER — Ambulatory Visit: Payer: Medicare Other | Admitting: Neurology

## 2021-08-29 NOTE — Progress Notes (Signed)
Assessment/Plan:   1.  Parkinsons Disease  -Status post left STN DBS at [facility] in August, 2014.  Battery changed in September, 2018 and on Nov 22, 2020            -Continue carbidopa/levodopa 25/100, 2 tablets at 7:30 AM/1 tablet at 10 AM/1 tablet at 1 PM/1 tablet at 4 PM/1 tablet at 7 PM.  Can take an extra if needed but not daily.  Told him no more than 7 tabls per day.  All meds should be distributed to him (although he does know this schedule very well) 2.  PDD, advancing             -Patient had 24 hour/day care by self reduced that.  I discussed with the patient again that I completely disagree with that and he needs 24 hour/day care, if not at home then at a facility.  I do not think the patient nor his wife can do this. 3. Myelodysplastic syndrome  -Following with hematology/oncology.  Has not been there in quite some time and per primary care records, patient does not remember he has this. 4.  Dysphagia, associated with Parkinson's disease  -Modified barium swallow while in the hospital in March, 2022, with evidence of moderate to severe aspiration risk.    -I would like to repeat that, but the patient states that he feels that he is clinically much better and would like to continue for now as it is. 5.  Sialorrhea  -Patient has received Myobloc on 2 different occasions.  He reports today that he thinks Myobloc has helped, but does not like coming here every 3 months.  He asks about it today, and I told him to just hold off on it and see how he does without it for now.  Subjective:   Casey Tucker was seen today in follow up for Parkinsons disease.  My previous records were reviewed prior to todays visit as well as outside records available to me.  His CNA is with him and supplements the history.  Last visit, the patient had transitioned from [facility] to home with 24 hour/day care.  I was concerned about that, as the patient had previously sent the caregivers home, but was  agreeable with he had 24 hours/day care.  Unfortunately, the patient has again reduced his nursing care to 11 AM to 7 PM.  Notes in the system from the patient's daughter indicate that they are again putting the patient on a waiting list for [facility] and they are trying to get power of attorney.    Estimates 3 falls since our last visit.  Didn't have walker.  "I was in a hurry."  Didn't get hurt.  He denies hallucinations.  States that when he takes higher dosages of gabapentin he will have hallucinations.  Current prescribed movement disorder medications: Carbidopa/levodopa 25/100, 2 tablets at 7:30 AM/1 tablet at 10 AM/1 tablet at 1 PM/1 tablet at 4 PM/1 tablet at 7 PM (states that he often takes 7 per day) -   PREVIOUS MEDICATIONS: entacapone (d/c last visit d/t confusion)  ALLERGIES:   Allergies  Allergen Reactions   Celebrex [Celecoxib] Other (See Comments)    Per MAR   Demerol Other (See Comments)    Hallucinations    Amoxicillin Rash   Penicillins Rash    CURRENT MEDICATIONS:  Outpatient Encounter Medications as of 09/01/2021  Medication Sig   acetaminophen (TYLENOL) 325  MG tablet Take 650 mg by mouth every 6 (six) hours as needed (pain).   carbidopa-levodopa (SINEMET IR) 25-100 MG tablet TAKE TWO TABLETS AT 7:30AM AND 1 TABLET AT 10AM  1PM  4PM  AND 7PM   carbidopa-levodopa (SINEMET IR) 25-100 MG tablet Take 2 tablets at 7:30 AM/1 tablet at 10 AM/1 tablet at 1 PM/1 tablet at 4 PM/1 tablet at 7 PM   escitalopram (LEXAPRO) 10 MG tablet TAKE ONE TABLET BY MOUTH EVERY DAY   feeding supplement (ENSURE ENLIVE / ENSURE PLUS) LIQD Take 237 mLs by mouth 3 (three) times daily between meals. (Patient taking differently: Take 237 mLs by mouth 3 (three) times daily between meals. (0800, 1200 & 1730))   ferrous sulfate 325 (65 FE) MG tablet TAKE ONE TABLET BY MOUTH EVERY OTHER DAY   gabapentin (NEURONTIN) 100 MG capsule Take 1 capsule (100 mg total) by mouth at bedtime. Takes the place of the  300mg  dose   ibuprofen (ADVIL) 400 MG tablet Take 400 mg by mouth every 12 (twelve) hours as needed (pain).   loratadine (CLARITIN) 10 MG tablet TAKE ONE TABLET BY MOUTH EVERY DAY   magnesium hydroxide (MILK OF MAGNESIA) 400 MG/5ML suspension Take 15 mLs by mouth daily as needed for mild constipation.   mirabegron ER (MYRBETRIQ) 25 MG TB24 tablet TAKE ONE TABLET BY MOUTH EVERY DAY   sennosides-docusate sodium (SENOKOT-S) 8.6-50 MG tablet Take 2 tablets by mouth daily. (0900)   No facility-administered encounter medications on file as of 09/01/2021.    Objective:   PHYSICAL EXAMINATION:    VITALS:   Vitals:   09/01/21 1313  BP: 132/82  Pulse: 73  SpO2: 98%  Weight: 161 lb (73 kg)  Height: 5\' 9"  (1.753 m)      GEN:  The patient appears stated age and is in NAD. HEENT:  Normocephalic, atraumatic.  The mucous membranes are moist. The superficial temporal arteries are without ropiness or tenderness.  Neurological examination:  Orientation: The patient is alert and oriented to person/place.  He generally knows his Parkinson's medicines very well, but can become somewhat confused about other things. Cranial nerves: There is good facial symmetry with facial hypomimia. The speech is fluent and dysarthric. Soft palate rises symmetrically and there is no tongue deviation. Hearing is intact to conversational tone. Sensation: Sensation is intact to light touch throughout Motor: Strength is at least antigravity x4.  Movement examination: Tone: There is normal tone in the upper and lower extremities. Abnormal movements: rare tremor in both hands today Coordination:  There is decremation with RAM's, with any form of RAMS, including alternating supination and pronation of the forearm, hand opening and closing, finger taps, heel taps and toe taps bilaterally Gait and Station: not tested today I have reviewed and interpreted the following labs independently    Chemistry      Component Value  Date/Time   NA 138 07/29/2021 1143   NA 142 08/11/2016 1701   NA 139 08/15/2011 0535   K 3.5 07/29/2021 1143   K 3.8 08/15/2011 0535   CL 105 07/29/2021 1143   CL 101 08/15/2011 0535   CO2 27 07/29/2021 1143   CO2 29 08/15/2011 0535   BUN 18 07/29/2021 1143   BUN 18 08/11/2016 1701   BUN 12 08/15/2011 0535   CREATININE 0.79 07/29/2021 1143   CREATININE 0.73 08/15/2011 0535      Component Value Date/Time   CALCIUM 8.5 07/29/2021 1143   CALCIUM 7.6 (L) 08/15/2011 0535  ALKPHOS 45 07/29/2021 1143   AST 11 07/29/2021 1143   ALT 3 07/29/2021 1143   BILITOT 1.2 07/29/2021 1143   BILITOT 1.2 08/11/2016 1701       Lab Results  Component Value Date   WBC 6.9 07/29/2021   HGB 7.9 Repeated and verified X2. (LL) 07/29/2021   HCT 23.5 Repeated and verified X2. (LL) 07/29/2021   MCV 123.2 Repeated and verified X2. (H) 07/29/2021   PLT 353.0 07/29/2021    Lab Results  Component Value Date   TSH 4.029 10/03/2020     Cc:  [name], MD

## 2021-09-01 ENCOUNTER — Ambulatory Visit (INDEPENDENT_AMBULATORY_CARE_PROVIDER_SITE_OTHER): Payer: Medicare Other | Admitting: Neurology

## 2021-09-01 ENCOUNTER — Other Ambulatory Visit: Payer: Self-pay

## 2021-09-01 ENCOUNTER — Encounter: Payer: Self-pay | Admitting: Neurology

## 2021-09-01 VITALS — BP 132/82 | HR 73 | Ht 69.0 in | Wt 161.0 lb

## 2021-09-01 DIAGNOSIS — K117 Disturbances of salivary secretion: Secondary | ICD-10-CM

## 2021-09-01 DIAGNOSIS — F0280 Dementia in other diseases classified elsewhere without behavioral disturbance: Secondary | ICD-10-CM | POA: Diagnosis not present

## 2021-09-01 DIAGNOSIS — G20 Parkinson's disease: Secondary | ICD-10-CM | POA: Diagnosis not present

## 2021-09-01 NOTE — Procedures (Signed)
DBS Programming was performed.    Manufacturer of DBS device: [employer]  Total time spent programming was 10 minutes.  Device was on.  Soft start was confirmed to be on.  Impedences were checked and were within normal limits.  Battery was checked and was determined to be functioning normally and not near the end of life.  Final settings were as follows:   Active Contact Amplitude (mA) PW (ms) Frequency (hz) Side Effects Battery  Left Brain        01/29/20        Group A (active) 2-C+ 3.7 60 135  2.88  Group B (not active) 1-C+ 3.2 90 140    08/02/20 2-C+ 3.9 60 140  2.76  10/09/20 2-C+ 3.9 60 140  2.62  02/24/21 2-C+ 3.9 60 140  2.96  09/01/21 2-C+ 4.0 60 135  2.94                  Right Brain        n/a                 Pt shown today how to use remote.

## 2021-09-03 ENCOUNTER — Telehealth: Payer: Self-pay | Admitting: Internal Medicine

## 2021-09-03 ENCOUNTER — Encounter: Payer: Self-pay | Admitting: Internal Medicine

## 2021-09-03 NOTE — Chronic Care Management (AMB) (Signed)
°  Chronic Care Management   Note  09/03/2021 Name: Casey Tucker MRN: [id number] DOB: [birth year]  Casey Tucker is a 81 y.o. year old male who is a primary care patient of Letvak, Theophilus Kinds, MD. I reached out to Suzzanne Cloud by phone today in response to a referral sent by Casey Tucker's PCP, [name], MD.   Mr. Sciandra was given information about Chronic Care Management services today including:  CCM service includes personalized support from designated clinical staff supervised by his physician, including individualized plan of care and coordination with other care providers 24/7 contact phone numbers for assistance for urgent and routine care needs. Service will only be billed when office clinical staff spend 20 minutes or more in a month to coordinate care. Only one practitioner may furnish and bill the service in a calendar month. The patient may stop CCM services at any time (effective at the end of the month) by phone call to the office staff.   Patient agreed to services and verbal consent obtained.   Follow up plan:   Tatjana Secretary/administrator

## 2021-09-05 ENCOUNTER — Other Ambulatory Visit: Payer: Self-pay | Admitting: Internal Medicine

## 2021-09-08 ENCOUNTER — Telehealth: Payer: Self-pay | Admitting: Neurology

## 2021-09-08 NOTE — Telephone Encounter (Signed)
Called patients caregiver back and tried to help trouble shoot DBS. Was unable to help patient to trouble shoot the device. Reached out to [place] at [place] and she is calling patient to see if she can help get them up and running again

## 2021-09-08 NOTE — Telephone Encounter (Signed)
The following message was left with [employer] on 09/08/21 at 2:37 PM.   An urgent message was left stating the patient DBS machine is not working, it is sending a connection error.  See printed note for more details in provider's inbox.

## 2021-09-29 ENCOUNTER — Telehealth: Payer: Medicare Other

## 2021-10-06 ENCOUNTER — Telehealth: Payer: Self-pay

## 2021-10-06 NOTE — Progress Notes (Signed)
? ? ?  Chronic Care Management ?Pharmacy Assistant  ? ?Name: Casey Tucker  MRN: [id number] DOB: Nov 19, 1940 ? ?Reason for Encounter: CCM (Initial Questions) ?  ?Recent office visits:  ?07/29/2021 - Casey Simpler, MD - Parkinson's Disease - Lab Critical Result: Hemoglobin: 7.9 and HCT 23.5 and MCV 123.2 ?06/20/2021 - Casey Simpler, MD - Message - Change (dosage) Gabapentin 100 mg ? ?Recent consult visits:  ?09/01/2021 - Casey Guiles Tat, DO (Neurology) - Dementia - DBS Programming ? ?Hospital visits:  ?None in previous 6 months ? ?Medications: ?Outpatient Encounter Medications as of 10/06/2021  ?Medication Sig  ? acetaminophen (TYLENOL) 325 MG tablet Take 650 mg by mouth every 6 (six) hours as needed (pain).  ? carbidopa-levodopa (SINEMET IR) 25-100 MG tablet TAKE TWO TABLETS AT 7:30AM AND 1 TABLET AT 10AM  1PM  4PM  AND 7PM  ? carbidopa-levodopa (SINEMET IR) 25-100 MG tablet Take 2 tablets at 7:30 AM/1 tablet at 10 AM/1 tablet at 1 PM/1 tablet at 4 PM/1 tablet at 7 PM  ? escitalopram (LEXAPRO) 10 MG tablet TAKE ONE TABLET BY MOUTH EVERY DAY  ? feeding supplement (ENSURE ENLIVE / ENSURE PLUS) LIQD Take 237 mLs by mouth 3 (three) times daily between meals. (Patient taking differently: Take 237 mLs by mouth 3 (three) times daily between meals. (0800, 1200 & 1730))  ? ferrous sulfate 325 (65 FE) MG tablet TAKE ONE TABLET BY MOUTH EVERY OTHER DAY  ? gabapentin (NEURONTIN) 100 MG capsule Take 1 capsule (100 mg total) by mouth at bedtime. Takes the place of the '300mg'$  dose  ? ibuprofen (ADVIL) 400 MG tablet Take 400 mg by mouth every 12 (twelve) hours as needed (pain).  ? loratadine (CLARITIN) 10 MG tablet TAKE ONE TABLET BY MOUTH EVERY DAY  ? magnesium hydroxide (MILK OF MAGNESIA) 400 MG/5ML suspension Take 15 mLs by mouth daily as needed for mild constipation.  ? mirabegron ER (MYRBETRIQ) 25 MG TB24 tablet TAKE ONE TABLET BY MOUTH EVERY DAY  ? sennosides-docusate sodium (SENOKOT-S) 8.6-50 MG tablet Take 2 tablets by mouth  daily. (0900)  ? ?No facility-administered encounter medications on file as of 10/06/2021.  ? ?No results found for: HGBA1C, MICROALBUR  ? ?BP Readings from Last 3 Encounters:  ?09/01/21 132/82  ?07/29/21 104/70  ?02/24/21 (!) 155/85  ? ? ?Patient contacted to confirm telephone appointment with Charlene Brooke, Pharm D, on 10/09/2021 at 1:30. ? ?Do you have any problems getting your medications? No ? ?What is your top health concern you would like to discuss at your upcoming visit? Patient's wife states he will probably have some questions and lately it has been difficult to understand what he is saying, but she will be with him for the phone call.  ? ?Have you seen any other providers since your last visit with PCP? Yes Neurology for DBS Programming on 09/01/2021 ? ?Star Rating Drugs:  ?Medication:  Last Fill: Day Supply ?No star rating drugs noted ? ?Care Gaps: ?Annual wellness visit in last year? No ?Most Recent BP reading: 132/82 on 09/01/2021 ? ?Charlene Brooke, CPP notified ? ?Marijean Niemann, RMA ?Clinical Pharmacy Assistant ?[phone] ? ? ? ?

## 2021-10-09 ENCOUNTER — Inpatient Hospital Stay
Admission: EM | Admit: 2021-10-09 | Discharge: 2021-10-12 | DRG: 871 | Disposition: A | Discharge status: Home Health | Payer: Medicare Other | Attending: Internal Medicine | Admitting: Internal Medicine

## 2021-10-09 ENCOUNTER — Telehealth: Payer: Medicare Other

## 2021-10-09 ENCOUNTER — Other Ambulatory Visit: Payer: Self-pay

## 2021-10-09 ENCOUNTER — Emergency Department: Payer: Medicare Other

## 2021-10-09 ENCOUNTER — Telehealth: Payer: Self-pay | Admitting: Internal Medicine

## 2021-10-09 DIAGNOSIS — Z888 Allergy status to other drugs, medicaments and biological substances status: Secondary | ICD-10-CM

## 2021-10-09 DIAGNOSIS — Z8261 Family history of arthritis: Secondary | ICD-10-CM

## 2021-10-09 DIAGNOSIS — N3000 Acute cystitis without hematuria: Secondary | ICD-10-CM

## 2021-10-09 DIAGNOSIS — J9811 Atelectasis: Secondary | ICD-10-CM | POA: Diagnosis not present

## 2021-10-09 DIAGNOSIS — Z79899 Other long term (current) drug therapy: Secondary | ICD-10-CM | POA: Diagnosis not present

## 2021-10-09 DIAGNOSIS — G20 Parkinson's disease: Secondary | ICD-10-CM | POA: Diagnosis present

## 2021-10-09 DIAGNOSIS — Z885 Allergy status to narcotic agent status: Secondary | ICD-10-CM | POA: Diagnosis not present

## 2021-10-09 DIAGNOSIS — W19XXXA Unspecified fall, initial encounter: Secondary | ICD-10-CM | POA: Diagnosis not present

## 2021-10-09 DIAGNOSIS — Z825 Family history of asthma and other chronic lower respiratory diseases: Secondary | ICD-10-CM

## 2021-10-09 DIAGNOSIS — K219 Gastro-esophageal reflux disease without esophagitis: Secondary | ICD-10-CM | POA: Diagnosis not present

## 2021-10-09 DIAGNOSIS — K5909 Other constipation: Secondary | ICD-10-CM | POA: Diagnosis not present

## 2021-10-09 DIAGNOSIS — N390 Urinary tract infection, site not specified: Secondary | ICD-10-CM | POA: Diagnosis present

## 2021-10-09 DIAGNOSIS — A419 Sepsis, unspecified organism: Secondary | ICD-10-CM

## 2021-10-09 DIAGNOSIS — R4182 Altered mental status, unspecified: Principal | ICD-10-CM

## 2021-10-09 DIAGNOSIS — J189 Pneumonia, unspecified organism: Secondary | ICD-10-CM | POA: Diagnosis present

## 2021-10-09 DIAGNOSIS — Z8249 Family history of ischemic heart disease and other diseases of the circulatory system: Secondary | ICD-10-CM | POA: Diagnosis not present

## 2021-10-09 DIAGNOSIS — D509 Iron deficiency anemia, unspecified: Secondary | ICD-10-CM | POA: Diagnosis not present

## 2021-10-09 DIAGNOSIS — Z96642 Presence of left artificial hip joint: Secondary | ICD-10-CM | POA: Diagnosis present

## 2021-10-09 DIAGNOSIS — R55 Syncope and collapse: Secondary | ICD-10-CM | POA: Diagnosis not present

## 2021-10-09 DIAGNOSIS — Z20822 Contact with and (suspected) exposure to covid-19: Secondary | ICD-10-CM | POA: Diagnosis present

## 2021-10-09 DIAGNOSIS — J181 Lobar pneumonia, unspecified organism: Secondary | ICD-10-CM | POA: Diagnosis not present

## 2021-10-09 DIAGNOSIS — R410 Disorientation, unspecified: Secondary | ICD-10-CM | POA: Diagnosis not present

## 2021-10-09 DIAGNOSIS — Z880 Allergy status to penicillin: Secondary | ICD-10-CM | POA: Diagnosis not present

## 2021-10-09 DIAGNOSIS — A4150 Gram-negative sepsis, unspecified: Secondary | ICD-10-CM | POA: Diagnosis not present

## 2021-10-09 DIAGNOSIS — D469 Myelodysplastic syndrome, unspecified: Secondary | ICD-10-CM | POA: Diagnosis present

## 2021-10-09 DIAGNOSIS — G9341 Metabolic encephalopathy: Secondary | ICD-10-CM | POA: Diagnosis not present

## 2021-10-09 DIAGNOSIS — K5900 Constipation, unspecified: Secondary | ICD-10-CM

## 2021-10-09 DIAGNOSIS — I959 Hypotension, unspecified: Secondary | ICD-10-CM | POA: Diagnosis not present

## 2021-10-09 LAB — CBC WITH DIFFERENTIAL/PLATELET
Abs Immature Granulocytes: 0.04 10*3/uL (ref 0.00–0.07)
Basophils Absolute: 0.1 10*3/uL (ref 0.0–0.1)
Basophils Relative: 1 %
Eosinophils Absolute: 0.1 10*3/uL (ref 0.0–0.5)
Eosinophils Relative: 1 %
HCT: 24.4 % — ABNORMAL LOW (ref 39.0–52.0)
Hemoglobin: 7.9 g/dL — ABNORMAL LOW (ref 13.0–17.0)
Immature Granulocytes: 1 %
Lymphocytes Relative: 7 %
Lymphs Abs: 0.4 10*3/uL — ABNORMAL LOW (ref 0.7–4.0)
MCH: 41.1 pg — ABNORMAL HIGH (ref 26.0–34.0)
MCHC: 32.4 g/dL (ref 30.0–36.0)
MCV: 127.1 fL — ABNORMAL HIGH (ref 80.0–100.0)
Monocytes Absolute: 0.6 10*3/uL (ref 0.1–1.0)
Monocytes Relative: 11 %
Neutro Abs: 4.6 10*3/uL (ref 1.7–7.7)
Neutrophils Relative %: 79 %
Platelets: 303 10*3/uL (ref 150–400)
RBC: 1.92 MIL/uL — ABNORMAL LOW (ref 4.22–5.81)
RDW: 14.6 % (ref 11.5–15.5)
Smear Review: NORMAL
WBC: 5.7 10*3/uL (ref 4.0–10.5)
nRBC: 0 % (ref 0.0–0.2)

## 2021-10-09 LAB — BLOOD GAS, VENOUS
Acid-base deficit: 0.4 mmol/L (ref 0.0–2.0)
Bicarbonate: 24.8 mmol/L (ref 20.0–28.0)
O2 Saturation: 54.1 %
Patient temperature: 37
pCO2, Ven: 42 mmHg — ABNORMAL LOW (ref 44–60)
pH, Ven: 7.38 (ref 7.25–7.43)
pO2, Ven: 36 mmHg (ref 32–45)

## 2021-10-09 LAB — URINALYSIS, ROUTINE W REFLEX MICROSCOPIC
Bilirubin Urine: NEGATIVE
Glucose, UA: NEGATIVE mg/dL
Hgb urine dipstick: NEGATIVE
Ketones, ur: 5 mg/dL — AB
Nitrite: NEGATIVE
Protein, ur: 30 mg/dL — AB
Specific Gravity, Urine: 1.013 (ref 1.005–1.030)
Squamous Epithelial / HPF: NONE SEEN (ref 0–5)
pH: 6 (ref 5.0–8.0)

## 2021-10-09 LAB — URINE DRUG SCREEN, QUALITATIVE (ARMC ONLY)
Amphetamines, Ur Screen: NOT DETECTED
Barbiturates, Ur Screen: NOT DETECTED
Benzodiazepine, Ur Scrn: NOT DETECTED
Cannabinoid 50 Ng, Ur ~~LOC~~: NOT DETECTED
Cocaine Metabolite,Ur ~~LOC~~: NOT DETECTED
MDMA (Ecstasy)Ur Screen: NOT DETECTED
Methadone Scn, Ur: NOT DETECTED
Opiate, Ur Screen: NOT DETECTED
Phencyclidine (PCP) Ur S: NOT DETECTED
Tricyclic, Ur Screen: NOT DETECTED

## 2021-10-09 LAB — COMPREHENSIVE METABOLIC PANEL
ALT: <5 U/L (ref 0–44)
AST: 14 U/L — ABNORMAL LOW (ref 15–41)
Albumin: 4 g/dL (ref 3.5–5.0)
Alkaline Phosphatase: 51 U/L (ref 38–126)
Anion gap: 5 (ref 5–15)
BUN: 21 mg/dL (ref 8–23)
CO2: 30 mmol/L (ref 22–32)
Calcium: 8.7 mg/dL — ABNORMAL LOW (ref 8.9–10.3)
Chloride: 102 mmol/L (ref 98–111)
Creatinine, Ser: 0.81 mg/dL (ref 0.61–1.24)
GFR, Estimated: >60 mL/min (ref >60)
Glucose, Bld: 100 mg/dL — ABNORMAL HIGH (ref 70–99)
Potassium: 3.8 mmol/L (ref 3.5–5.1)
Sodium: 137 mmol/L (ref 135–145)
Total Bilirubin: 1.7 mg/dL — ABNORMAL HIGH (ref 0.3–1.2)
Total Protein: 7 g/dL (ref 6.5–8.1)

## 2021-10-09 LAB — LACTIC ACID, PLASMA
Lactic Acid, Venous: 2.3 mmol/L (ref 0.5–1.9)
Lactic Acid, Venous: 1.4 mmol/L (ref 0.5–1.9)
Lactic Acid, Venous: 1.5 mmol/L (ref 0.5–1.9)

## 2021-10-09 LAB — RESP PANEL BY RT-PCR (FLU A&B, COVID) ARPGX2
Influenza A by PCR: NEGATIVE
Influenza B by PCR: NEGATIVE
SARS Coronavirus 2 by RT PCR: NEGATIVE

## 2021-10-09 LAB — STREP PNEUMONIAE URINARY ANTIGEN: Strep Pneumo Urinary Antigen: NEGATIVE

## 2021-10-09 LAB — AMMONIA: Ammonia: <10 umol/L (ref 9–35)

## 2021-10-09 MED ORDER — CARBIDOPA-LEVODOPA 25-100 MG PO TABS: 1.0000 | ORAL_TABLET | Freq: Four times a day (QID) | ORAL | Status: DC

## 2021-10-09 MED ORDER — ENSURE ENLIVE PO LIQD
237.0000 mL | Freq: Three times a day (TID) | ORAL | Status: DC
  Administered 2021-10-09 – 2021-10-12 (×9): 237 mL via ORAL

## 2021-10-09 MED ORDER — FERROUS SULFATE 325 (65 FE) MG PO TABS
325.0000 mg | ORAL_TABLET | ORAL | Status: DC
  Administered 2021-10-10 – 2021-10-12 (×2): 325 mg via ORAL
  Filled 2021-10-09 (×2): qty 1

## 2021-10-09 MED ORDER — SENNOSIDES-DOCUSATE SODIUM 8.6-50 MG PO TABS
2.0000 | ORAL_TABLET | Freq: Every day | ORAL | Status: DC
  Administered 2021-10-09 – 2021-10-12 (×4): 2 via ORAL
  Filled 2021-10-09 (×4): qty 2

## 2021-10-09 MED ORDER — CARBIDOPA-LEVODOPA 25-100 MG PO TABS
1.0000 | ORAL_TABLET | Freq: Four times a day (QID) | ORAL | Status: DC
  Administered 2021-10-09 – 2021-10-12 (×11): 1 via ORAL
  Filled 2021-10-09 (×12): qty 1

## 2021-10-09 MED ORDER — SODIUM CHLORIDE 0.9 % IV SOLN
500.0000 mg | INTRAVENOUS | Status: DC
  Administered 2021-10-10 – 2021-10-12 (×3): 500 mg via INTRAVENOUS
  Filled 2021-10-09 (×3): qty 5

## 2021-10-09 MED ORDER — LORATADINE 10 MG PO TABS
10.0000 mg | ORAL_TABLET | Freq: Every day | ORAL | Status: DC
  Administered 2021-10-09 – 2021-10-11 (×3): 10 mg via ORAL
  Filled 2021-10-09 (×3): qty 1

## 2021-10-09 MED ORDER — DM-GUAIFENESIN ER 30-600 MG PO TB12
1.0000 | ORAL_TABLET | Freq: Two times a day (BID) | ORAL | Status: DC | PRN
Start: 2021-10-09 — End: 2021-10-12

## 2021-10-09 MED ORDER — SODIUM CHLORIDE 0.9 % IV SOLN
1.0000 g | Freq: Every day | INTRAVENOUS | Status: DC
  Administered 2021-10-10 – 2021-10-12 (×3): 1 g via INTRAVENOUS
  Filled 2021-10-09 (×3): qty 10

## 2021-10-09 MED ORDER — SODIUM CHLORIDE 0.9 % IV SOLN
500.0000 mg | Freq: Once | INTRAVENOUS | Status: AC
  Administered 2021-10-09: 500 mg via INTRAVENOUS
  Filled 2021-10-09: qty 5

## 2021-10-09 MED ORDER — ENOXAPARIN SODIUM 40 MG/0.4ML IJ SOSY
40.0000 mg | PREFILLED_SYRINGE | Freq: Every day | INTRAMUSCULAR | Status: DC
  Administered 2021-10-09 – 2021-10-11 (×3): 40 mg via SUBCUTANEOUS
  Filled 2021-10-09 (×3): qty 0.4

## 2021-10-09 MED ORDER — ESCITALOPRAM OXALATE 10 MG PO TABS
10.0000 mg | ORAL_TABLET | Freq: Every day | ORAL | Status: DC
  Administered 2021-10-10 – 2021-10-12 (×3): 10 mg via ORAL
  Filled 2021-10-09 (×3): qty 1

## 2021-10-09 MED ORDER — SODIUM CHLORIDE 0.9 % IV BOLUS
1000.0000 mL | Freq: Once | INTRAVENOUS | Status: AC
  Administered 2021-10-09: 1000 mL via INTRAVENOUS

## 2021-10-09 MED ORDER — CARBIDOPA-LEVODOPA 25-100 MG PO TABS
2.0000 | ORAL_TABLET | Freq: Every day | ORAL | Status: DC
Start: 2021-10-10 — End: 2021-10-12
  Administered 2021-10-10 – 2021-10-12 (×3): 2 via ORAL
  Filled 2021-10-09 (×3): qty 2

## 2021-10-09 MED ORDER — ALBUTEROL SULFATE (2.5 MG/3ML) 0.083% IN NEBU: 3.0000 mL | INHALATION_SOLUTION | RESPIRATORY_TRACT | Status: DC | PRN

## 2021-10-09 MED ORDER — GABAPENTIN 100 MG PO CAPS
100.0000 mg | ORAL_CAPSULE | Freq: Four times a day (QID) | ORAL | Status: DC
Start: 2021-10-09 — End: 2021-10-12
  Administered 2021-10-09 – 2021-10-12 (×11): 100 mg via ORAL
  Filled 2021-10-09 (×11): qty 1

## 2021-10-09 MED ORDER — MIRABEGRON ER 25 MG PO TB24
25.0000 mg | ORAL_TABLET | Freq: Every day | ORAL | Status: DC
  Administered 2021-10-10 – 2021-10-11 (×2): 25 mg via ORAL
  Filled 2021-10-09 (×2): qty 1

## 2021-10-09 MED ORDER — SODIUM CHLORIDE 0.9 % IV SOLN: INTRAVENOUS | Status: DC

## 2021-10-09 MED ORDER — SODIUM CHLORIDE 0.9 % IV SOLN
1.0000 g | Freq: Once | INTRAVENOUS | Status: AC
  Administered 2021-10-09: 1 g via INTRAVENOUS
  Filled 2021-10-09: qty 10

## 2021-10-09 NOTE — Telephone Encounter (Signed)
Called and spoke with patient wife regarding message. Pt 's wife inform us that  patient is currently in the hospital and not doing well and just want to let you know. ?

## 2021-10-09 NOTE — ED Triage Notes (Signed)
Pt to ED from home ?Family noticed AMS starting today and noticed urine was darka dn cloudy ?98.3 oral, 110/63, negative stroke scale per EMS ? ?Alert, oriented to self. Not to place, situation or time ?

## 2021-10-09 NOTE — ED Notes (Signed)
Request made for transport to the floor ?

## 2021-10-09 NOTE — Plan of Care (Signed)
  Problem: Activity: Goal: Ability to tolerate increased activity will improve Outcome: Progressing   Problem: Clinical Measurements: Goal: Ability to maintain a body temperature in the normal range will improve Outcome: Progressing   Problem: Respiratory: Goal: Ability to maintain adequate ventilation will improve Outcome: Progressing Goal: Ability to maintain a clear airway will improve Outcome: Progressing   

## 2021-10-09 NOTE — Telephone Encounter (Signed)
Pt daughter called in to make Dr Silvio Pate aware that pt is in the hospital  ?

## 2021-10-09 NOTE — ED Notes (Signed)
Transport called to bring pt to floor.  

## 2021-10-09 NOTE — Telephone Encounter (Addendum)
Patient in ED today as of 12:11pm. Cancelling CCM Initial phone appt, will reschedule for later date. ?

## 2021-10-09 NOTE — Telephone Encounter (Signed)
Yeah--I think he probably should go back to SNF-----will hash out with daughter depending on how he does with the hospital stay ?

## 2021-10-09 NOTE — ED Notes (Signed)
Called RT to inform order for ABG. ?

## 2021-10-09 NOTE — Telephone Encounter (Signed)
Let her know I saw that and was waiting for their evaluation. ? ?I have notified the admissions coordinator at [facility] so she can monitor (as he may need rehab) ?

## 2021-10-09 NOTE — ED Notes (Signed)
Pt tried to urinate for urine sample. Unable to urinate at this time. ?

## 2021-10-09 NOTE — ED Notes (Signed)
Helped pt to toilet for BM. Pt has shuffling gait with short steps. No BM. ?

## 2021-10-09 NOTE — ED Notes (Signed)
RT at bedside to draw ABG

## 2021-10-09 NOTE — H&P (Addendum)
?History and Physical  ? ? ?Casey Tucker:[id number] DOB: 07/16/1941 DOA: 10/09/2021 ? ?Referring MD/NP/PA:  ? ?PCP: [name], MD  ? ?Patient coming from:  The patient is coming from SNF ?   ? ?Chief Complaint: cough and AMS ? ?HPI: Casey Tucker is a 81 y.o. male with medical history significant of Parkinson's disease, GERD, depression, kidney stone, Gilbert syndrome, anemia, MDS, who presents with cough and altered mental status. ? ?Patient has AMS,  and is unable to provide accurate medical history, therefore, most of the history is obtained by discussing the case with ED physician, per EMS report, and with the nursing staff. ? ?Per report, pt was found to be confused today. When I saw pt, he is alert, but confused, he knows his own name and knows that he is in hospital, but he is confused about a year 2023.  He has dry cough, does not seem to have chest pain or shortness of breath.  He is constipated.  He does not have nausea vomiting, diarrhea or abdominal pain.  He moves all extremities normally.  No facial droop. ? ?Data Reviewed and ED Course: pt was found to have WBC 5.7, lactic acid 2.3, 1.4, negative UDS, pending COVID PCR, ammonia level less than 10, urinalysis (hazy appearance, small amount of leukocyte, rare bacteria, WBC 21-50), temperature normal, blood pressure 137/88, heart rate 84, RR 23, oxygen saturation 100% on room air.  Chest x-ray showed mild left lower lobe opacity.  CT of head is negative for acute intracranial abnormalities.  Patient is admitted to [place] bed as inpatient. ? ? ?EKG: I have personally reviewed.  Sinus rhythm, QTc 449, nonspecific T wave change ? ? ?Review of Systems: Could not reviewed accurately due to altered mental status. ? ? ?Allergy:  ?Allergies  ?Allergen Reactions  ? Celebrex [Celecoxib] Other (See Comments)  ?  Per MAR  ? Demerol Other (See Comments)  ?  Hallucinations   ? Amoxicillin Rash  ? Penicillins Rash  ? ? ?Past Medical History:  ?Diagnosis  Date  ? Arthritis   ? left hip   ? GERD (gastroesophageal reflux disease) Pre 2002  ? Mild  ? History of kidney stones   ? History of MRI of brain and brain stem 12/12/2004  ? 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 Parkinson disease ([place]) 2004  ? Slowly progressive  ? Ruptured appendix teens  ? ? ?Past Surgical History:  ?Procedure Laterality Date  ? APPENDECTOMY    ? CATARACT EXTRACTION W/ INTRAOCULAR LENS  IMPLANT, BILATERAL  2017  ? DEEP BRAIN STIMULATOR PLACEMENT  8/14  ? L STN  ? DOPPLER ECHOCARDIOGRAPHY  01/10/2009  ? LV NML Mild LVH EF 60-65% aortic sclerosis w/0 stenosis   ? JOINT REPLACEMENT    ? left hip replacement 08/14/11/[place]   ? SUBTHALAMIC STIMULATOR BATTERY REPLACEMENT Left 03/25/2017  ? Procedure: Deep Brain Stimulator battery replacement;  Surgeon: Erline Levine, MD;  Location: [facility];  Service: Neurosurgery;  Laterality: Left;  left  ? SUBTHALAMIC STIMULATOR BATTERY REPLACEMENT N/A 11/22/2020  ? Procedure: Change Implantable Pulse Generator Battery;  Surgeon: Erline Levine, MD;  Location: [facility];  Service: Neurosurgery;  Laterality: N/A;  3C/RM 21  ? TONSILLECTOMY    ? TOTAL HIP REVISION  08/27/2011  ? Procedure: TOTAL HIP REVISION;  Surgeon: Mauri Pole, MD;  Location: [facility];  Service: Orthopedics;  Laterality: Left;  ? ? ?Social History:  reports that he has never smoked. He  has never used smokeless tobacco. He reports that he does not currently use alcohol after a past usage of about 7.0 standard drinks per week. He reports that he does not use drugs. ? ?Family History:  ?Family History  ?Problem Relation Age of Onset  ? Arthritis Mother   ?     knee replacement  ? COPD Father   ?     emphysema, smoker  ? Heart disease Father   ?     CHF  ? Healthy Sister   ? Alcohol abuse Paternal Uncle   ? Rheum arthritis Daughter   ?  ? ?Prior to Admission medications   ?Medication Sig Start Date End Date Taking? Authorizing Provider  ?acetaminophen (TYLENOL) 325 MG tablet Take  650 mg by mouth every 6 (six) hours as needed (pain).    [provider]  ?carbidopa-levodopa (SINEMET IR) 25-100 MG tablet TAKE TWO TABLETS AT 7:30AM AND 1 TABLET AT 10AM  1PM  4PM  AND 7PM 05/19/21   Tat, Eustace Quail, DO  ?carbidopa-levodopa (SINEMET IR) 25-100 MG tablet Take 2 tablets at 7:30 AM/1 tablet at 10 AM/1 tablet at 1 PM/1 tablet at 4 PM/1 tablet at 7 PM 05/19/21   Tat, Eustace Quail, DO  ?escitalopram (LEXAPRO) 10 MG tablet TAKE ONE TABLET BY MOUTH EVERY DAY 03/21/21   [name], MD  ?feeding supplement (ENSURE ENLIVE / ENSURE PLUS) LIQD Take 237 mLs by mouth 3 (three) times daily between meals. ?Patient taking differently: Take 237 mLs by mouth 3 (three) times daily between meals. (0800, 1200 & 1730) 10/06/20   Danford, Suann Larry, MD  ?ferrous sulfate 325 (65 FE) MG tablet TAKE ONE TABLET BY MOUTH EVERY OTHER DAY 09/08/21   [name], MD  ?gabapentin (NEURONTIN) 100 MG capsule Take 1 capsule (100 mg total) by mouth at bedtime. Takes the place of the '300mg'$  dose 06/20/21   [name], MD  ?ibuprofen (ADVIL) 400 MG tablet Take 400 mg by mouth every 12 (twelve) hours as needed (pain).    [provider]  ?loratadine (CLARITIN) 10 MG tablet TAKE ONE TABLET BY MOUTH EVERY DAY 04/18/21   [name], MD  ?magnesium hydroxide (MILK OF MAGNESIA) 400 MG/5ML suspension Take 15 mLs by mouth daily as needed for mild constipation. 10/06/20   Danford, Suann Larry, MD  ?mirabegron ER (MYRBETRIQ) 25 MG TB24 tablet TAKE ONE TABLET BY MOUTH EVERY DAY 03/21/21   [name], MD  ?sennosides-docusate sodium (SENOKOT-S) 8.6-50 MG tablet Take 2 tablets by mouth daily. (0900)    [provider]  ? ? ?Physical Exam: ?Vitals:  ? 10/09/21 1445 10/09/21 1600 10/09/21 1615 10/09/21 1647  ?BP:  133/68  91/66  ?Pulse: 84 88 85 83  ?Resp: '15 20 18 20  '$ ?Temp:    99 ?F (37.2 ?C)  ?TempSrc:    Oral  ?SpO2: 100% 100% 100% 98%  ?Weight:      ?Height:      ? ?General: Not in acute  distress ?HEENT: ?      Eyes: PERRL, EOMI, no scleral icterus. ?      ENT: No discharge from the ears and nose ?      Neck: No JVD, no bruit, no mass felt. ?Heme: No neck lymph node enlargement. ?Cardiac: S1/S2, RRR, No murmurs, No gallops or rubs. ?Respiratory: No rales, wheezing, rhonchi or rubs. ?GI: Soft, nondistended, nontender, no organomegaly, BS present. ?GU: No hematuria ?Ext: No pitting leg edema bilaterally. 1+DP/PT  pulse bilaterally. ?Musculoskeletal: No joint deformities, No joint redness or warmth, no limitation of ROM in spin. ?Skin: No rashes.  ?Neuro: alert, but confused, knows his own name and knows that he is in hospital, but he is confused about year 2023. Cranial nerves II-XII grossly intact, moves all extremities normally. ?Psych: Patient is not psychotic, no suicidal or hemocidal ideation. ? ?Labs on Admission: I have personally reviewed following labs and imaging studies ? ?CBC: ?Recent Labs  ?Lab 10/09/21 ?1225  ?WBC 5.7  ?NEUTROABS 4.6  ?HGB 7.9*  ?HCT 24.4*  ?MCV 127.1*  ?PLT 303  ? ?Basic Metabolic Panel: ?Recent Labs  ?Lab 10/09/21 ?1225  ?NA 137  ?K 3.8  ?CL 102  ?CO2 30  ?GLUCOSE 100*  ?BUN 21  ?CREATININE 0.81  ?CALCIUM 8.7*  ? ?GFR: ?Estimated Creatinine Clearance: 72.3 mL/min (by C-G formula based on SCr of 0.81 mg/dL). ?Liver Function Tests: ?Recent Labs  ?Lab 10/09/21 ?1225  ?AST 14*  ?ALT <5  ?ALKPHOS 51  ?BILITOT 1.7*  ?PROT 7.0  ?ALBUMIN 4.0  ? ?No results for input(s): LIPASE, AMYLASE in the last 168 hours. ?Recent Labs  ?Lab 10/09/21 ?1225  ?AMMONIA <10  ? ?Coagulation Profile: ?No results for input(s): INR, PROTIME in the last 168 hours. ?Cardiac Enzymes: ?No results for input(s): CKTOTAL, CKMB, CKMBINDEX, TROPONINI in the last 168 hours. ?BNP (last 3 results) ?No results for input(s): PROBNP in the last 8760 hours. ?HbA1C: ?No results for input(s): HGBA1C in the last 72 hours. ?CBG: ?No results for input(s): GLUCAP in the last 168 hours. ?Lipid Profile: ?No results for  input(s): CHOL, HDL, LDLCALC, TRIG, CHOLHDL, LDLDIRECT in the last 72 hours. ?Thyroid Function Tests: ?No results for input(s): TSH, T4TOTAL, FREET4, T3FREE, THYROIDAB in the last 72 hours. ?Anemia Panel: ?No re

## 2021-10-09 NOTE — ED Notes (Signed)
Lactic 2.3 EDP informed. ?

## 2021-10-09 NOTE — ED Provider Notes (Signed)
? ?[facility] ?Provider Note ? ? Event Date/Time  ? First MD Initiated Contact with Patient 10/09/21 1217   ?  (approximate) ?History  ?Altered Mental Status ? ?HPI ?Casey Tucker is a 81 y.o. male with a past medical history of Parkinson's with neurostimulator, Gilbert's syndrome, and myelodysplastic syndrome who presents from home for altered mental status starting today.  Family noticed that patient's urine was dark and cloudy.  Family informed EMS that patient has not had a significant history of urinary tract infections but at baseline is oriented to person, place, and time.  Patient is only oriented to self at this time but denies any pain or other complaints ?Physical Exam  ?Triage Vital Signs: ?ED Triage Vitals  ?Enc Vitals Group  ?   BP 10/09/21 1216 (!) 106/58  ?   Pulse Rate 10/09/21 1216 72  ?   Resp 10/09/21 1216 18  ?   Temp 10/09/21 1216 98 ?F (36.7 ?C)  ?   Temp Source 10/09/21 1216 Oral  ?   SpO2 10/09/21 1216 100 %  ?   Weight 10/09/21 1219 155 lb (70.3 kg)  ?   Height 10/09/21 1219 6' (1.829 m)  ?   Head Circumference --   ?   Peak Flow --   ?   Pain Score --   ?   Pain Loc --   ?   Pain Edu? --   ?   Excl. in [place]? --   ? ?Most recent vital signs: ?Vitals:  ? 10/09/21 1330 10/09/21 1445  ?BP: 137/88   ?Pulse: 83 84  ?Resp: (!) 23 15  ?Temp:    ?SpO2: 100% 100%  ? ?General: Awake, oriented x1 ?CV:  Good peripheral perfusion.  ?Resp:  Normal effort.  ?Abd:  No distention.  ?Other:  Elderly Caucasian male laying in bed in no distress.  Patient is confused to situation.  Patient cannot tell me who the president is. ?ED Results / Procedures / Treatments  ?Labs ?(all labs ordered are listed, but only abnormal results are displayed) ?Labs Reviewed  ?COMPREHENSIVE METABOLIC PANEL - Abnormal; Notable for the following components:  ?    Result Value  ? Glucose, Bld 100 (*)   ? Calcium 8.7 (*)   ? AST 14 (*)   ? Total Bilirubin 1.7 (*)   ? All other components within normal limits   ?CBC WITH DIFFERENTIAL/PLATELET - Abnormal; Notable for the following components:  ? RBC 1.92 (*)   ? Hemoglobin 7.9 (*)   ? HCT 24.4 (*)   ? MCV 127.1 (*)   ? MCH 41.1 (*)   ? Lymphs Abs 0.4 (*)   ? All other components within normal limits  ?URINALYSIS, ROUTINE W REFLEX MICROSCOPIC - Abnormal; Notable for the following components:  ? Color, Urine YELLOW (*)   ? APPearance HAZY (*)   ? Ketones, ur 5 (*)   ? Protein, ur 30 (*)   ? Leukocytes,Ua SMALL (*)   ? Bacteria, UA RARE (*)   ? All other components within normal limits  ?LACTIC ACID, PLASMA - Abnormal; Notable for the following components:  ? Lactic Acid, Venous 2.3 (*)   ? All other components within normal limits  ?CULTURE, BLOOD (ROUTINE X 2)  ?CULTURE, BLOOD (ROUTINE X 2)  ?RESP PANEL BY RT-PCR (FLU A&B, COVID) ARPGX2  ?AMMONIA  ?URINE DRUG SCREEN, QUALITATIVE ([facility] ONLY)  ?BLOOD GAS, ARTERIAL  ?LACTIC ACID, PLASMA  ?LACTIC ACID, PLASMA  ? ?  RADIOLOGY ?ED MD interpretation: CT of the head without contrast interpreted by me shows no evidence of acute abnormalities including no intracerebral hemorrhage, obvious masses, or significant edema ? ?Single view portable chest x-ray interpreted by me and shows mild left lower lobe atelectasis versus infiltrate ?-Agree with radiology assessment ?Official radiology report(s): ?CT Head Wo Contrast ? ?Result Date: 10/09/2021 ?CLINICAL DATA:  Mental status change EXAM: CT HEAD WITHOUT CONTRAST TECHNIQUE: Contiguous axial images were obtained from the base of the skull through the vertex without intravenous contrast. RADIATION DOSE REDUCTION: This exam was performed according to the departmental dose-optimization program which includes automated exposure control, adjustment of the mA and/or kV according to patient size and/or use of iterative reconstruction technique. COMPARISON:  CT head 10/02/2020 FINDINGS: Brain: No evidence of acute infarction, hemorrhage, hydrocephalus, extra-axial collection or mass lesion/mass  effect. Unilateral deep brain stimulator on the left extending into the thalamus unchanged. Vascular: Negative for hyperdense vessel Skull: No acute abnormality.  Left frontal burr hole Sinuses/Orbits: Paranasal sinuses clear. Bilateral cataract extraction Other: None IMPRESSION: No acute intracranial abnormality. Unilateral deep brain stimulator on the left unchanged in position. Electronically Signed   By: Franchot Gallo M.D.   On: 10/09/2021 14:15  ? ?DG Chest Port 1 View ? ?Result Date: 10/09/2021 ?CLINICAL DATA:  Altered mental status EXAM: PORTABLE CHEST 1 VIEW COMPARISON:  10/02/2020 FINDINGS: Heart size and vascularity normal. Mild left lower lobe atelectasis. Right lung clear. No edema or effusion. Generator pack overlying the left chest for deep brain stimulator unchanged. Chronic fracture with lack of bony union in the distal left clavicle, unchanged. Advanced degenerative change both shoulders. IMPRESSION: Mild left lower lobe atelectasis/infiltrate. Electronically Signed   By: Franchot Gallo M.D.   On: 10/09/2021 14:16   ?PROCEDURES: ?Critical Care performed: No ?.1-3 Lead EKG Interpretation ?Performed by: Naaman Plummer, MD ?Authorized by: Naaman Plummer, MD  ? ?  Interpretation: normal   ?  ECG rate:  83 ?  ECG rate assessment: normal   ?  Rhythm: sinus rhythm   ?  Ectopy: none   ?  Conduction: normal   ?MEDICATIONS ORDERED IN ED: ?Medications  ?sodium chloride 0.9 % bolus 1,000 mL (0 mLs Intravenous Stopped 10/09/21 1453)  ?  And  ?0.9 %  sodium chloride infusion (has no administration in time range)  ?cefTRIAXone (ROCEPHIN) 1 g in sodium chloride 0.9 % 100 mL IVPB (has no administration in time range)  ?azithromycin (ZITHROMAX) 500 mg in sodium chloride 0.9 % 250 mL IVPB (has no administration in time range)  ? ?IMPRESSION / MDM / ASSESSMENT AND PLAN / ED COURSE  ?I reviewed the triage vital signs and the nursing notes. ?             ?               ?The patient is on the cardiac monitor to evaluate  for evidence of arrhythmia and/or significant heart rate changes. ? ?Presents with altered mental status and no acute complaints ? ?DDx: PE, COPD exacerbation, Pneumothorax, TB, Atypical ACS, Esophageal Rupture, Toxic Exposure, Foreign Body Airway Obstruction. ? ?Workup: ?CXR ?CBC, CMP, lactate, troponin ? ?Given History, Exam, and Workup presentation most consistent with pneumonia. ? ?Findings: ?Left lower lobe infiltrates on chest x-ray ? ?Tx: ?Ceftriaxone 1g IV ?Azithromycin '500mg'$  IV ? ?1515 Reassessment: As patient is continuing to suffer from altered mental status, patient will require admission to the internal medicine service for further evaluation and management ? ?Disposition:  Admit ? ?  ?FINAL CLINICAL IMPRESSION(S) / ED DIAGNOSES  ? ?Final diagnoses:  ?Altered mental status, unspecified altered mental status type  ?Community acquired pneumonia of left lower lobe of lung  ? ?Rx / DC Orders  ? ?ED Discharge Orders   ? ? None  ? ?  ? ?Note:  This document was prepared using Dragon voice recognition software and may include unintentional dictation errors. ?  ?Naaman Plummer, MD ?10/09/21 1523 ? ?

## 2021-10-10 ENCOUNTER — Encounter: Payer: Self-pay | Admitting: Internal Medicine

## 2021-10-10 DIAGNOSIS — K5900 Constipation, unspecified: Secondary | ICD-10-CM

## 2021-10-10 LAB — HEMOGLOBIN AND HEMATOCRIT, BLOOD
HCT: 21.2 % — ABNORMAL LOW (ref 39.0–52.0)
Hemoglobin: 7 g/dL — ABNORMAL LOW (ref 13.0–17.0)

## 2021-10-10 LAB — CBC
HCT: 21.1 % — ABNORMAL LOW (ref 39.0–52.0)
Hemoglobin: 7 g/dL — ABNORMAL LOW (ref 13.0–17.0)
MCH: 41.7 pg — ABNORMAL HIGH (ref 26.0–34.0)
MCHC: 33.2 g/dL (ref 30.0–36.0)
MCV: 125.6 fL — ABNORMAL HIGH (ref 80.0–100.0)
Platelets: 232 10*3/uL (ref 150–400)
RBC: 1.68 MIL/uL — ABNORMAL LOW (ref 4.22–5.81)
RDW: 14.5 % (ref 11.5–15.5)
WBC: 3.9 10*3/uL — ABNORMAL LOW (ref 4.0–10.5)
nRBC: 0 % (ref 0.0–0.2)

## 2021-10-10 LAB — GLUCOSE, CAPILLARY: Glucose-Capillary: 98 mg/dL (ref 70–99)

## 2021-10-10 MED ORDER — ACETAMINOPHEN 325 MG PO TABS
650.0000 mg | ORAL_TABLET | Freq: Four times a day (QID) | ORAL | Status: DC | PRN
  Administered 2021-10-10 – 2021-10-12 (×3): 650 mg via ORAL
  Filled 2021-10-10 (×3): qty 2

## 2021-10-10 NOTE — Assessment & Plan Note (Signed)
Continue oral iron supplement °

## 2021-10-10 NOTE — Progress Notes (Signed)
?Progress Note ? ? ?Patient: Casey Tucker DSK:[id number] DOB: Jan 16, 1941 DOA: 10/09/2021     1 ?DOS: the patient was seen and examined on 10/10/2021 ?  ?Brief hospital course: ?HARVY RIERA is a 81 y.o. male with medical history significant of Parkinson's disease, GERD, depression, kidney stone, Gilbert syndrome, anemia, MDS, who presented to the ED on 10/09/2021 with cough and altered mental status. ? ?Evaluation in the ED consistent with UTI. ?Patient was started on empiric Rocephin and admitted to the hospital pending cultures. ? ?Assessment and Plan: ?* UTI (urinary tract infection) ?Present on admission. ?Urinalysis leukocytes, rare bacteria, 21-50 WBCs. ?-- Continue empiric Rocephin ?--Follow urine culture ? ?Constipation ?Chronic in the setting of Parkinson's disease.  Bowel regimen ordered ? ?CAP (community acquired pneumonia) ?Possible, patient did present with cough and chest x-ray showing a mild left lower lobe infiltration which could be infection or atelectasis. ?-- Continue Rocephin and Zithromax ?-- Mucinex and other supportive care as needed ?-- Monitor oxygen sats and supplement if less than 90% ? ?Iron deficiency anemia ?Continue oral iron supplement ? ?Acute metabolic encephalopathy ?Presented with confusion which has improved with IV antibiotics for UTI and possible pneumonia.  CT head was negative on admission.  No focal neurologic deficits. ?-- Delirium precautions ?-- Continue antibiotics as outlined ? ?MDS (myelodysplastic syndrome) ([place]) ?Hemoglobin on admission 7.9, this morning 7.0, suspect dilution given IV fluids.  Daughter Juliann Pulse consents to blood transfusion if needed. ?-- Repeat hemoglobin this afternoon ?--CBC in a.m. ?-- Reduce fluids or stop if taking p.o. while ?-- Transfuse if hemoglobin less than 7 ? ?Parkinson's disease ([place]) ?Appears overall stable.  Continue Sinemet ? ? ? ? ?  ? ?Subjective: Patient awake sitting up in bed when seen today.  He reports feeling fine and asked  if he can go home today.  He says that there is a Christmas party that he really hopes to attend. ?Spoke with daughter this afternoon.  She states they had a birthday party for him last weekend and the setting was similar to their family Christmas parties.  She notes improvement in his confusion since admission. ? ?Physical Exam: ?Vitals:  ? 10/09/21 1647 10/09/21 2042 10/10/21 0336 10/10/21 0711  ?BP: 91/66 (!) 116/54 (!) 127/52 (!) 117/48  ?Pulse: 83 78 85 78  ?Resp: '20 18 18 20  '$ ?Temp: 99 ?F (37.2 ?C) 97.9 ?F (36.6 ?C) 99.9 ?F (37.7 ?C) 98.4 ?F (36.9 ?C)  ?TempSrc: Oral  Oral Oral  ?SpO2: 98% 100% 98% 95%  ?Weight:      ?Height:      ? ? ?General exam: awake, alert, no acute distress, mildly confused ?HEENT: moist mucus membranes, hearing grossly normal  ?Respiratory system: CTAB no wheezes, rales or rhonchi, normal respiratory effort. ?Cardiovascular system: normal S1/S2, RRR, no pedal edema.   ?Gastrointestinal system: soft, NT, ND, no HSM felt, +bowel sounds. ?Central nervous system: A&O x2. no gross focal neurologic deficits, normal speech ?Extremities: moves all , no edema, normal tone ?Skin: dry, intact, normal temperature ?Psychiatry: normal mood, congruent affect ? ? ? ?Data Reviewed: ? ?Labs reviewed and notable for hemoglobin 7.0, WBC 3.9 ? ?Family Communication: Spoke with daughter, Juliann Pulse, by phone this afternoon ? ?Disposition: ?Status is: Inpatient ?Remains inpatient appropriate because: Severity of illness remaining on IV antibiotics pending urine culture and further clinical improvement ? ? ? Planned Discharge Destination: Home with Home Health ? ? ? ?Time spent: 35 minutes ? ?Author: ?Ezekiel Slocumb, DO ?10/10/2021 3:21 PM ? ?For  on call review [email or web address].  ?

## 2021-10-10 NOTE — Assessment & Plan Note (Addendum)
Present on admission. ?Urinalysis leukocytes, rare bacteria, 21-50 WBCs. ?Urine cx grew Enterococcus faecalis sensitive only to ampicillin, Macrobid and vancomycin ?-- Treated with empiric Rocephin ?-- Transitioned to Macrobid based on culture results ?-- Discharge with 5 days  ?

## 2021-10-10 NOTE — Assessment & Plan Note (Addendum)
Hemoglobin on admission 7.9 >> 7.0, >> 7.2 >> 6.9 this AM.  Daughter Juliann Pulse agreeable and consented to blood transfusion today. ?--Transfuse 1 unit pRBC's ?--Follow up with Dr. Tasia Catchings ?--Repeat CBC at close follow up ?--Transfuse if hemoglobin less than 7 ?

## 2021-10-10 NOTE — Assessment & Plan Note (Addendum)
Appears overall stable. ?Continue Sinemet. ?Follow-up with neurology as scheduled. ?

## 2021-10-10 NOTE — Evaluation (Signed)
Physical Therapy Evaluation Patient Details Name: Casey Tucker MRN: [id number] DOB: 31-Oct-1940 Today's Date: 10/10/2021  History of Present Illness  Pt is an 81 y/o M admitted on 10/09/21 after presenting with c/c of cough & AMS. Pt is being treated for UTI & acute metabolic encephalopathy. PMH: Parkinson's disease, GERD, depression, kidney stone, Gilbert Syndrome, anemia, MDS  Clinical Impression  Pt seen for PT evaluation with pt's personal care aide Casey Tucker) present & providing PLOF/home set up information. Prior to admission pt required assistance for all mobility tasks & ambulated household distances with RW. Pt also lives with wife who has early stages of dementia (per [name]). On this date, pt requires CGA for STS & min assist for safe gait with RW. Pt is impulsive, with reduced safety awareness, with mobility. Pt demonstrates impaired use of AD & requires cuing to sit until PT is ready to mobilize pt. PT educated caregiver on using "BIG" cues for pt to if/when he has a freezing episode associated with his parkinson's. Anticipate pt can d/c home with caregiver assistance & HHPT f/u. As of right now, caregivers only come 11AM-7PM daily & unsure if pt's wife can provide assistance during the time they are not there. Pt would benefit from reliable 24 hr assistance upon d/c.  Will continue to follow pt acutely to address balance, endurance, & stair negotiation.     Recommendations for follow up therapy are one component of a multi-disciplinary discharge planning process, led by the attending physician.  Recommendations may be updated based on patient status, additional functional criteria and insurance authorization.  Follow Up Recommendations Home health PT    Assistance Recommended at Discharge Frequent or constant Supervision/Assistance  Patient can return home with the following  A little help with walking and/or transfers;A little help with bathing/dressing/bathroom;Direct supervision/assist  for financial management;Assistance with cooking/housework;Help with stairs or ramp for entrance;Direct supervision/assist for medications management;Assist for transportation    Equipment Recommendations None recommended by PT (pt has all DME needs)  Recommendations for Other Services       Functional Status Assessment Patient has had a recent decline in their functional status and demonstrates the ability to make significant improvements in function in a reasonable and predictable amount of time.     Precautions / Restrictions Precautions Precautions: Fall Restrictions Weight Bearing Restrictions: No      Mobility  Bed Mobility               General bed mobility comments: not observed, pt received & left sitting in recliner    Transfers Overall transfer level: Needs assistance Equipment used: Rolling walker (2 wheels) Transfers: Sit to/from Stand Sit to Stand: Min guard           General transfer comment: Pt attempts to transfer STS before PT has all lines/leads ready & pt requires frequent, max cuing to sit until PT is prepared.    Ambulation/Gait Ambulation/Gait assistance: Min assist Gait Distance (Feet): 120 Feet Assistive device: Rolling walker (2 wheels) Gait Pattern/deviations: Decreased step length - right, Decreased step length - left, Decreased dorsiflexion - right, Decreased dorsiflexion - left       General Gait Details: Pt pushes RW out in front of him, RLE appears to externally rotate during gait, impulsive with mobility (unsure if this is baseline or due to current impaired cognition).  Stairs            Wheelchair Mobility    Modified Rankin (Stroke Patients Only)  Balance Overall balance assessment: Needs assistance Sitting-balance support: No upper extremity supported, Feet supported Sitting balance-Leahy Scale: Good     Standing balance support: Bilateral upper extremity supported, During functional activity Standing  balance-Leahy Scale: Fair                               Pertinent Vitals/Pain Pain Assessment Pain Assessment: No/denies pain    Home Living Family/patient expects to be discharged to:: Private residence Living Arrangements: Spouse/significant other;Other (Comment) (personal care aide) Available Help at Discharge: Family;Personal care attendant;Available PRN/intermittently Type of Home: House Home Access: Stairs to enter Entrance Stairs-Rails: Left Entrance Stairs-Number of Steps: 5 Alternate Level Stairs-Number of Steps: 2-3 steps with L rail to access bedroom (but can sleep in another room & not have to access stairs) Home Layout: Multi-level Home Equipment: Rolling Walker (2 wheels);Shower seat;Wheelchair - manual      Prior Function Prior Level of Function : Needs assist       Physical Assist : Mobility (physical) Mobility (physical): Bed mobility;Transfers;Gait;Stairs   Mobility Comments: Caregiver Casey Tucker) present & reports pt requires assistance for all aspects of mobility (bed mobility, transfers, gait & stair negotiation). Pt uses a w/c & RW but primarily uses RW. Pt denies falls but wife told Casey Tucker pt had 1 fall prior to her arrival. She assists pt out of bed in the morning when she gets there & assists him into bed at night before she leaves. Personal care aides assist 11AM-7PM each day. ADLs Comments: Pt was able to toilet during the day, but used "yugo" hybrid urinal/catheter system at night.     Hand Dominance        Extremity/Trunk Assessment   Upper Extremity Assessment Upper Extremity Assessment: Generalized weakness    Lower Extremity Assessment Lower Extremity Assessment: Generalized weakness    Cervical / Trunk Assessment Cervical / Trunk Assessment: Kyphotic  Communication   Communication: HOH  Cognition Arousal/Alertness: Awake/alert Behavior During Therapy: WFL for tasks assessed/performed Overall Cognitive Status:  Impaired/Different from baseline Area of Impairment: Orientation, Attention, Memory, Safety/judgement, Awareness, Following commands, Problem solving                 Orientation Level: Disoriented to, Time, Situation, Place (Pt oriented to himself & his birthday but reports year is 2022. Caregiver reports pt's cognition has improved since yesterday but is still not back to baseline.)     Following Commands: Follows one step commands consistently, Follows one step commands with increased time Safety/Judgement: Decreased awareness of safety, Decreased awareness of deficits Awareness: Intellectual Problem Solving: Decreased initiation, Slow processing          General Comments General comments (skin integrity, edema, etc.): At beginning of session RN noting pt's HR in 200s per telemetry while sitting in recliner (prior to mobilizing). PT observed telemetry reading 166 bpm but quickly, steadily dropping to 79 bpm. Pt without c/o & without other HR issues during questioning portion of evaluation so session continued. Max HR during/after gait was 87 bpm. SpO2 >90% on room air.    Exercises     Assessment/Plan    PT Assessment Patient needs continued PT services  PT Problem List Decreased strength;Decreased mobility;Decreased safety awareness;Decreased coordination;Decreased activity tolerance;Decreased balance;Decreased knowledge of use of DME;Decreased cognition;Cardiopulmonary status limiting activity       PT Treatment Interventions DME instruction;Gait training;Balance training;Therapeutic exercise;Stair training;Neuromuscular re-education;Functional mobility training;Therapeutic activities;Patient/family education;Cognitive remediation    PT Goals (Current goals can  be found in the Care Plan section)  Acute Rehab PT Goals Patient Stated Goal: return home with HHPT PT Goal Formulation: With patient (with personal care aide Casey Tucker)) Time For Goal Achievement: 10/24/21 Potential  to Achieve Goals: Good    Frequency Min 2X/week     Co-evaluation               AM-PAC PT "6 Clicks" Mobility  Outcome Measure Help needed turning from your back to your side while in a flat bed without using bedrails?: A Little Help needed moving from lying on your back to sitting on the side of a flat bed without using bedrails?: A Little Help needed moving to and from a bed to a chair (including a wheelchair)?: A Little Help needed standing up from a chair using your arms (e.g., wheelchair or bedside chair)?: A Little Help needed to walk in hospital room?: A Little Help needed climbing 3-5 steps with a railing? : A Lot 6 Click Score: 17    End of Session Equipment Utilized During Treatment: Gait belt Activity Tolerance: Patient tolerated treatment well Patient left: in chair;with chair alarm set;with call bell/phone within reach (personal care aide present in room) Nurse Communication: Mobility status (HR) PT Visit Diagnosis: Unsteadiness on feet (R26.81);Muscle weakness (generalized) (M62.81);Other abnormalities of gait and mobility (R26.89)    Time: 4098-1191 PT Time Calculation (min) (ACUTE ONLY): 23 min   Charges:   PT Evaluation $PT Eval Moderate Complexity: 1 Mod PT Treatments $Therapeutic Activity: 8-22 mins        Aleda Grana, PT, DPT 10/10/21, 2:09 PM   Sandi Mariscal 10/10/2021, 2:07 PM

## 2021-10-10 NOTE — Assessment & Plan Note (Addendum)
Possible, patient did present with cough and chest x-ray showing a mild left lower lobe infiltration which could be infection or atelectasis.  Not requiring oxygen. ?-- Treated with empiric Rocephin and Zithromax in addition to supportive care ?-- Discharge with TWO more days oral antibiotics (Zithromax and Macrobid which covers the UTI) ?

## 2021-10-10 NOTE — Hospital Course (Addendum)
Casey Tucker is a 81 y.o. male with medical history significant of Parkinson's disease, GERD, depression, kidney stone, Gilbert syndrome, anemia, MDS, who presented to the ED on 10/09/2021 with cough and altered mental status.  Evaluation in the ED consistent with UTI.  Chest xray concerning for possible LLL pneumonia. ?Patient was started on empiric Rocephin, Zithromax and admitted to the hospital pending cultures. ? ?3/25: pt had fever of 102.2 F yesterday late afternoon. ? ?3/26: no further fevers.  ?Patient's hemoglobin 6.9 this AM. Daughter consented to RBC transfusion.  This is due to MDS, there is no evidence of bleeding.   ? ? ?Patient demonstrates significant clinical improvement and mental status back to baseline.  He is medically stable for discharge this afternoon.   ?

## 2021-10-10 NOTE — TOC Initial Note (Addendum)
Transition of Care (TOC) - Initial/Assessment Note  ? ? ?Patient Details  ?Name: Casey Tucker ?MRN: [id number] ?Date of Birth: [birth year] ? ?Transition of Care (TOC) CM/SW Contact:    ?Laurena Slimmer, RN ?Phone Number: ?10/10/2021, 1:58 PM ? ?Clinical Narrative:                 ?Patient admitted for altered mental status. Alert and oriented x2.Spoke with wife regarding discharge plan. ?Family support in place. ?Wife will transport home upon discharge ?Will arrange home health physical therapy  per PT. Wife in agreement with [facility]  ? ?Admitted from:Home ?PCP: Dr. [name] ?[facility]: [facility]: No issues obtaining medications ?Current home health/prior home health/DME:[facility]: Rolling walker , lift chair ? ? ?Expected Discharge Plan: [place] ?Barriers to Discharge: Continued Medical Work up ? ? ?Patient Goals and CMS Choice ?Patient states their goals for this hospitalization and ongoing recovery are:: To return home ?  ?Choice offered to / list presented to : Spouse ? ?Expected Discharge Plan and Services ?Expected Discharge Plan: [place] ?  ?Discharge Planning Services: CM Consult ?Post Acute Care Choice: Home Health ?Living arrangements for the past 2 months: [place] ?                ?  ?  ?  ?  ?  ?HH Arranged: PT ?[place] Agency: [place] ([place]) ?Date HH Agency Contacted: 10/10/21 ?Time [place]: [id number] ?Representative spoke with at [place]: Floydene Flock ? ?Prior Living Arrangements/Services ?Living arrangements for the past 2 months: [place] ?Lives with:: Spouse ?Patient language and need for interpreter reviewed:: Yes ?Do you feel safe going back to the place where you live?: Yes      ?Need for Family Participation in Patient Care: Yes (Comment) ?Care giver support system in place?: Yes (comment) ?Current home services: Home PT ?Criminal Activity/Legal Involvement Pertinent to Current  Situation/Hospitalization: No - Comment as needed ? ?Activities of Daily Living ?Home Assistive Devices/Equipment: Gilford Rile (specify type) ?ADL Screening (condition at time of admission) ?Patient's cognitive ability adequate to safely complete daily activities?: No ?Is the patient deaf or have difficulty hearing?: No ?Does the patient have difficulty seeing, even when wearing glasses/contacts?: No ?Does the patient have difficulty concentrating, remembering, or making decisions?: No ?Patient able to express need for assistance with ADLs?: Yes ?Does the patient have difficulty dressing or bathing?: No ?Independently performs ADLs?: No ?Communication: Independent ?Dressing (OT): Needs assistance ?Grooming: Needs assistance ?Feeding: Independent ?Bathing: Needs assistance ?Toileting: Needs assistance ?In/Out Bed: Needs assistance ?Does the patient have difficulty walking or climbing stairs?: No ?Weakness of Legs: Both ?Weakness of Arms/Hands: Both ? ?Permission Sought/Granted ?Permission sought to share information with : Family Supports ?  ? Share Information with NAME: Spouse ?   ?   ?   ? ?Emotional Assessment ?  ?  ?  ?Orientation: : Oriented to Self ?Alcohol / Substance Use: Never Used ?Psych Involvement: No (comment) ? ?Admission diagnosis:  UTI (urinary tract infection) [N39.0] ?Altered mental status, unspecified altered mental status type [R41.82] ?Community acquired pneumonia of left lower lobe of lung [J18.9] ?Patient Active Problem List  ? Diagnosis Date Noted  ? Constipation 10/10/2021  ? UTI (urinary tract infection) 10/09/2021  ? Acute metabolic encephalopathy 38/45/3646  ? Iron deficiency anemia 10/09/2021  ? CAP (community acquired pneumonia) 10/09/2021  ? Protein-calorie malnutrition, severe 10/04/2020  ? Syncope and collapse 10/02/2020  ? Leukocytosis 10/02/2020  ?  Ureteral stone with hydronephrosis 10/02/2020  ? B12 deficiency 04/03/2020  ? MDS (myelodysplastic syndrome) ([place]) 04/03/2020  ? Goals of  care, counseling/discussion 04/03/2020  ? Syncope 02/07/2020  ? Anemia 02/07/2020  ? Leukopenia 02/07/2020  ? Neurogenic urinary incontinence 11/02/2019  ? Vascular dementia without behavioral disturbance ([place]) 07/04/2019  ? Mood disorder ([place]) 07/04/2019  ? Spinal stenosis, lumbar region with neurogenic claudication 04/07/2019  ? Urinary frequency 01/21/2018  ? Advanced directives, counseling/discussion 04/20/2014  ? Routine general medical examination at a health care facility 03/16/2012  ? S/P revision of left total hip 08/27/2011  ? Osteoarthrosis, hip 08/12/2011  ? Parkinson's disease ([place]) 01/29/2011  ? GILBERT'S SYNDROME 12/09/2007  ? GERD 12/09/2007  ? RENAL CALCULUS, HX OF 12/09/2007  ? ?PCP:  [name], MD ?Pharmacy:   ?[facility] - [place], [place] - [address] ?[address] ?[place] [address] ?Phone: [phone] Fax: [phone] ? ?[place], [place] ?[address] ?[place] [address] ?Phone: [phone] Fax: [phone] ? ?[facility] [place], [place] [address] ?[place] [address] ?Phone: [phone] Fax: [phone] ? ? ? ? ?Social Determinants of Health (SDOH) Interventions ?  ? ?Readmission Risk Interventions ?   ? View : No data to display.  ?  ?  ?  ? ? ? ?

## 2021-10-10 NOTE — Assessment & Plan Note (Addendum)
Presented with confusion which has improved with IV antibiotics for UTI and possible pneumonia.  CT head was negative on admission.  No focal neurologic deficits. ? ?Mental status returned to baseline with treatment of infection/s. ?

## 2021-10-10 NOTE — Assessment & Plan Note (Signed)
Chronic in the setting of Parkinson's disease.  Bowel regimen ordered ?

## 2021-10-10 NOTE — Evaluation (Signed)
Occupational Therapy Evaluation ?Patient Details ?Name: Casey Tucker ?MRN: [id number] ?DOB: 02/27/1941 ?Today's Date: 10/10/2021 ? ? ?History of Present Illness Casey Tucker is a 81 y.o. male with medical history significant of Parkinson's disease, GERD, depression, kidney stone, Gilbert syndrome, anemia, MDS, who presents with cough and altered mental status.  ? ?Clinical Impression ?  ?Casey Tucker was seen for OT evaluation this date. Prior to hospital admission, pt required PRN assist for mobility and ADLs using RW. Pt lives with spouse in home c ramped entrance. Pt presents to acute OT demonstrating impaired ADL performance and functional mobility 2/2 decreased activity tolerance and functional strength/balance deficits. Pt currently requires MIN A exit R side of bed, increased time and bed rail use. Pt requires MOD A don B socks seated EOB. CGA + RW for in room mobility. Pt would benefit from skilled OT to address noted impairments and functional limitations (see below for any additional details). Upon hospital discharge, recommend HHOT to maximize pt safety and return to PLOF. ?  ? ?Recommendations for follow up therapy are one component of a multi-disciplinary discharge planning process, led by the attending physician.  Recommendations may be updated based on patient status, additional functional criteria and insurance authorization.  ? ?Follow Up Recommendations ? Home health OT  ?  ?Assistance Recommended at Discharge Intermittent Supervision/Assistance  ?Patient can return home with the following A little help with walking and/or transfers;A little help with bathing/dressing/bathroom;Help with stairs or ramp for entrance ? ?  ?Functional Status Assessment ? Patient has had a recent decline in their functional status and demonstrates the ability to make significant improvements in function in a reasonable and predictable amount of time.  ?Equipment Recommendations ? None recommended by OT  ?   ?Recommendations for Other Services   ? ? ?  ?Precautions / Restrictions Precautions ?Precautions: Fall ?Restrictions ?Weight Bearing Restrictions: No  ? ?  ? ?Mobility Bed Mobility ?Overal bed mobility: Needs Assistance ?Bed Mobility: Supine to Sit ?  ?  ?Supine to sit: Min assist ?  ?  ?  ?  ? ?Transfers ?Overall transfer level: Needs assistance ?Equipment used: Rolling walker (2 wheels) ?Transfers: Sit to/from Stand ?Sit to Stand: Min guard ?  ?  ?  ?  ?  ?General transfer comment: initial MIN A from bed height improving to CGA from chair with arm rests ?  ? ?  ?Balance Overall balance assessment: Needs assistance ?Sitting-balance support: No upper extremity supported, Feet supported ?Sitting balance-Leahy Scale: Good ?  ?  ?Standing balance support: Bilateral upper extremity supported ?Standing balance-Leahy Scale: Fair ?Standing balance comment: Parkinsons baseline ?  ?  ?  ?  ?  ?  ?  ?  ?  ?  ?  ?   ? ?ADL either performed or assessed with clinical judgement  ? ?ADL Overall ADL's : Needs assistance/impaired ?  ?  ?  ?  ?  ?  ?  ?  ?  ?  ?  ?  ?  ?  ?  ?  ?  ?  ?  ?General ADL Comments: MOD A don B socks seated EOB. CGA + RW for ADL t/f  ? ? ? ? ?Pertinent Vitals/Pain Pain Assessment ?Pain Assessment: No/denies pain  ? ? ? ?Hand Dominance   ?  ?Extremity/Trunk Assessment Upper Extremity Assessment ?Upper Extremity Assessment: Overall WFL for tasks assessed ?  ?Lower Extremity Assessment ?Lower Extremity Assessment: Generalized weakness ?  ?  ?  ?Communication Communication ?  Communication: HOH ?  ?Cognition Arousal/Alertness: Awake/alert ?Behavior During Therapy: [facility] for tasks assessed/performed ?Overall Cognitive Status: Within Functional Limits for tasks assessed ?  ?  ?  ?  ?  ?  ?  ?  ?  ?  ?  ?  ?  ?  ?  ?  ?  ?  ?  ?   ?   ?   ? ? ?Home Living Family/patient expects to be discharged to:: Private residence ?Living Arrangements: Spouse/significant other ?Available Help at Discharge: Family ?Type of Home:  House ?Home Access: Ramped entrance ?  ?  ?Home Layout: One level ?  ?  ?  ?  ?  ?  ?  ?Home Equipment: Conservation officer, nature (2 wheels);Shower seat ?  ?  ?  ? ?  ?Prior Functioning/Environment Prior Level of Function : Needs assist ?  ?  ?  ?  ?  ?  ?Mobility Comments: PRN assist for mobility 2/2 Parkinsons ?ADLs Comments: PRN assist for ADLs 2/2 Parkinsons ?  ? ?  ?  ?OT Problem List: Decreased strength;Decreased activity tolerance;Impaired balance (sitting and/or standing);Decreased safety awareness ?  ?   ?OT Treatment/Interventions: Self-care/ADL training;Therapeutic exercise;Energy conservation;DME and/or AE instruction;Therapeutic activities;Patient/family education;Balance training  ?  ?OT Goals(Current goals can be found in the care plan section) Acute Rehab OT Goals ?Patient Stated Goal: to go home ?OT Goal Formulation: With patient ?Time For Goal Achievement: 10/24/21 ?Potential to Achieve Goals: Good  ?OT Frequency: Min 2X/week ?  ? ?Co-evaluation   ?  ?  ?  ?  ? ?  ?AM-PAC OT "6 Clicks" Daily Activity     ?Outcome Measure Help from another person eating meals?: None ?Help from another person taking care of personal grooming?: A Little ?Help from another person toileting, which includes using toliet, bedpan, or urinal?: A Little ?Help from another person bathing (including washing, rinsing, drying)?: A Little ?Help from another person to put on and taking off regular upper body clothing?: None ?Help from another person to put on and taking off regular lower body clothing?: A Lot ?6 Click Score: 19 ?  ?End of Session Equipment Utilized During Treatment: Rolling walker (2 wheels) ?Nurse Communication: Mobility status ? ?Activity Tolerance: Patient tolerated treatment well ?Patient left: in chair;with call bell/phone within reach;with chair alarm set ? ?OT Visit Diagnosis: Other abnormalities of gait and mobility (R26.89);Muscle weakness (generalized) (M62.81)  ?              ?Time: 1050-1107 ?OT Time Calculation  (min): 17 min ?Charges:  OT General Charges ?$OT Visit: 1 Visit ?OT Evaluation ?$OT Eval Low Complexity: 1 Low ? ?Dessie Coma, M.S. OTR/L  ?10/10/21, 1:22 PM  ?ascom [phone] ? ?

## 2021-10-11 DIAGNOSIS — A419 Sepsis, unspecified organism: Secondary | ICD-10-CM

## 2021-10-11 DIAGNOSIS — N390 Urinary tract infection, site not specified: Secondary | ICD-10-CM

## 2021-10-11 LAB — BASIC METABOLIC PANEL
Anion gap: 8 (ref 5–15)
BUN: 17 mg/dL (ref 8–23)
CO2: 23 mmol/L (ref 22–32)
Calcium: 8.1 mg/dL — ABNORMAL LOW (ref 8.9–10.3)
Chloride: 107 mmol/L (ref 98–111)
Creatinine, Ser: 0.72 mg/dL (ref 0.61–1.24)
GFR, Estimated: >60 mL/min (ref >60)
Glucose, Bld: 139 mg/dL — ABNORMAL HIGH (ref 70–99)
Potassium: 3.6 mmol/L (ref 3.5–5.1)
Sodium: 138 mmol/L (ref 135–145)

## 2021-10-11 LAB — CBC
HCT: 22.3 % — ABNORMAL LOW (ref 39.0–52.0)
Hemoglobin: 7.2 g/dL — ABNORMAL LOW (ref 13.0–17.0)
MCH: 40.7 pg — ABNORMAL HIGH (ref 26.0–34.0)
MCHC: 32.3 g/dL (ref 30.0–36.0)
MCV: 126 fL — ABNORMAL HIGH (ref 80.0–100.0)
Platelets: 232 10*3/uL (ref 150–400)
RBC: 1.77 MIL/uL — ABNORMAL LOW (ref 4.22–5.81)
RDW: 14.5 % (ref 11.5–15.5)
WBC: 2.1 10*3/uL — ABNORMAL LOW (ref 4.0–10.5)
nRBC: 0 % (ref 0.0–0.2)

## 2021-10-11 LAB — GLUCOSE, CAPILLARY: Glucose-Capillary: 94 mg/dL (ref 70–99)

## 2021-10-11 MED ORDER — BISACODYL 5 MG PO TBEC
5.0000 mg | DELAYED_RELEASE_TABLET | Freq: Every day | ORAL | Status: DC | PRN
  Administered 2021-10-11: 5 mg via ORAL
  Filled 2021-10-11: qty 1

## 2021-10-11 MED ORDER — MAGNESIUM HYDROXIDE 400 MG/5ML PO SUSP
30.0000 mL | Freq: Every day | ORAL | Status: DC | PRN
  Administered 2021-10-11 – 2021-10-12 (×2): 30 mL via ORAL
  Filled 2021-10-11 (×2): qty 30

## 2021-10-11 NOTE — Progress Notes (Addendum)
?Progress Note ? ? ?Patient: Casey Tucker:[id number] DOB: 09-29-1940 DOA: 10/09/2021     2 ?DOS: the patient was seen and examined on 10/11/2021 ?  ?Brief hospital course: ?Casey Tucker is a 81 y.o. male with medical history significant of Parkinson's disease, GERD, depression, kidney stone, Gilbert syndrome, anemia, MDS, who presented to the ED on 10/09/2021 with cough and altered mental status. ? ?Evaluation in the ED consistent with UTI. ?Patient was started on empiric Rocephin and admitted to the hospital pending cultures. ? ?3/25: pt had fever of 102.2 F yesterday late afternoon. ? ?Assessment and Plan: ?* UTI (urinary tract infection) ?Present on admission. ?Urinalysis leukocytes, rare bacteria, 21-50 WBCs. ?3/25: urine cx growing Enterococcus faecalis ?-- Continue empiric Rocephin ?-- Follow urine culture for susceptibilities ? ?Sepsis secondary to UTI [facility]) ?Patient meets criteria for sepsis with fever, leukopenia in the setting of UTI and probable pneumonia.  No evidence of organ dysfunction, lactic acid normal. ?-- Continue antibiotics as outlined ?-- Follow cultures ?-- Monitor fever curve and CBC ?-- Monitor hemodynamics ? ?Constipation ?Chronic in the setting of Parkinson's disease.  Bowel regimen ordered ? ?CAP (community acquired pneumonia) ?Possible, patient did present with cough and chest x-ray showing a mild left lower lobe infiltration which could be infection or atelectasis. ?-- Continue Rocephin and Zithromax ?-- Mucinex and other supportive care as needed ?-- Monitor oxygen sats and supplement if less than 90% ? ?Iron deficiency anemia ?Continue oral iron supplement ? ?Acute metabolic encephalopathy ?Presented with confusion which has improved with IV antibiotics for UTI and possible pneumonia.  CT head was negative on admission.  No focal neurologic deficits. ?-- Delirium precautions ?-- Continue antibiotics as outlined ? ?MDS (myelodysplastic syndrome) ([place]) ?Hemoglobin on admission  7.9 >> 7.0, >> 7.2 this AM.  Most likely some dilution given IV fluids.  Daughter Casey Tucker consents to blood transfusion if needed. ?-- Daily CBC to monitor ?-- Transfuse if hemoglobin less than 7 ? ?Parkinson's disease ([place]) ?Appears overall stable.  Continue Sinemet ? ? ? ? ?  ? ?Subjective: Patient awake sitting up in bed with caregiver at bedside this morning.  He had not had a bowel movement yet but was given milk of mag this morning which caregiver says typically works out well at home.  Patient reports ongoing productive cough.  Caregiver reports that his voice sounds deeper than usual.  He was hopeful to go home today.  He denies feeling feverish or having chills overnight or this morning. ? ? ? ?Physical Exam: ?Vitals:  ? 10/10/21 1715 10/10/21 1924 10/11/21 0419 10/11/21 0747  ?BP:  (!) 120/49 117/68 (!) 93/55  ?Tucker:  77 72 66  ?Resp:  '20 20 20  '$ ?Temp: 99 ?F (37.2 ?C) 99.9 ?F (37.7 ?C) 97.9 ?F (36.6 ?C) 98.5 ?F (36.9 ?C)  ?TempSrc:  Oral Oral Oral  ?SpO2:  96% 97% 94%  ?Weight:      ?Height:      ? ?General exam: awake, alert, no acute distress ?HEENT: moist mucus membranes, hearing grossly normal  ?Respiratory system: Clear with faint left basilar crackles, no wheezes, normal respiratory effort. ?Cardiovascular system: normal S1/S2, RRR, no pedal edema.   ?Central nervous system: A&O x 2. no gross focal neurologic deficits, normal speech ?Extremities: moves all, no edema, normal tone ?Skin: dry, intact, normal temperature ?Psychiatry: normal mood, congruent affect ? ? ? ?Data Reviewed: ? ?Labs reviewed notable for glucose 139, calcium 8.1, WBC 2.1, hemoglobin 7.2 ? ?Family Communication: Caregiver at bedside  on rounds today.  Spoke with patient's daughter Casey Tucker this afternoon by phone. ? ?Disposition: ?Status is: Inpatient ?Remains inpatient appropriate because: Severity of illness remaining on IV antibiotics pending cultures, febrile yesterday evening. ? ? ? Planned Discharge Destination: Home with Home  Health ? ? ? ?Time spent: 35 minutes ? ?Author: ?Ezekiel Slocumb, DO ?10/11/2021 12:50 PM ? ?For on call review [email or web address].  ?

## 2021-10-11 NOTE — Assessment & Plan Note (Addendum)
Patient meets criteria for sepsis with fever, leukopenia in the setting of UTI and probable pneumonia.  No evidence of organ dysfunction, lactic acid normal. ?Sepsis physiology resolved ?--Treated with antibiotics as outlined ?-- Follow blood cultures -negative to date ?

## 2021-10-12 LAB — URINE CULTURE: Culture: 40000 — AB

## 2021-10-12 LAB — CBC
HCT: 21.4 % — ABNORMAL LOW (ref 39.0–52.0)
Hemoglobin: 6.9 g/dL — ABNORMAL LOW (ref 13.0–17.0)
MCH: 40.4 pg — ABNORMAL HIGH (ref 26.0–34.0)
MCHC: 32.2 g/dL (ref 30.0–36.0)
MCV: 125.1 fL — ABNORMAL HIGH (ref 80.0–100.0)
Platelets: 226 10*3/uL (ref 150–400)
RBC: 1.71 MIL/uL — ABNORMAL LOW (ref 4.22–5.81)
RDW: 14.2 % (ref 11.5–15.5)
WBC: 2.6 10*3/uL — ABNORMAL LOW (ref 4.0–10.5)
nRBC: 0 % (ref 0.0–0.2)

## 2021-10-12 LAB — IRON AND TIBC
Iron: 49 ug/dL (ref 45–182)
Saturation Ratios: 25 % (ref 17.9–39.5)
TIBC: 196 ug/dL — ABNORMAL LOW (ref 250–450)
UIBC: 147 ug/dL

## 2021-10-12 LAB — RETICULOCYTES
Immature Retic Fract: 14.3 % (ref 2.3–15.9)
RBC.: 1.89 MIL/uL — ABNORMAL LOW (ref 4.22–5.81)
Retic Count, Absolute: 36.1 10*3/uL (ref 19.0–186.0)
Retic Ct Pct: 1.9 % (ref 0.4–3.1)

## 2021-10-12 LAB — VITAMIN B12: Vitamin B-12: 484 pg/mL (ref 180–914)

## 2021-10-12 LAB — GLUCOSE, CAPILLARY: Glucose-Capillary: 87 mg/dL (ref 70–99)

## 2021-10-12 LAB — PREPARE RBC (CROSSMATCH)

## 2021-10-12 LAB — FOLATE: Folate: 13.2 ng/mL (ref >5.9)

## 2021-10-12 LAB — FERRITIN: Ferritin: 378 ng/mL — ABNORMAL HIGH (ref 24–336)

## 2021-10-12 MED ORDER — AZITHROMYCIN 500 MG PO TABS: 500.0000 mg | ORAL_TABLET | Freq: Every day | ORAL | 0 refills | Status: AC

## 2021-10-12 MED ORDER — NITROFURANTOIN MONOHYD MACRO 100 MG PO CAPS
100.0000 mg | ORAL_CAPSULE | Freq: Two times a day (BID) | ORAL | Status: DC
  Administered 2021-10-12: 100 mg via ORAL
  Filled 2021-10-12: qty 1

## 2021-10-12 MED ORDER — AZITHROMYCIN 500 MG PO TABS: 500.0000 mg | ORAL_TABLET | Freq: Every day | ORAL | 0 refills | Status: DC

## 2021-10-12 MED ORDER — SODIUM CHLORIDE 0.9% IV SOLUTION: Freq: Once | INTRAVENOUS | Status: AC

## 2021-10-12 MED ORDER — NITROFURANTOIN MONOHYD MACRO 100 MG PO CAPS: 100.0000 mg | ORAL_CAPSULE | Freq: Two times a day (BID) | ORAL | 0 refills | Status: DC

## 2021-10-12 NOTE — Final Progress Note (Signed)
Hbg: 6.9 this am. Notified NP Randol Kern. No new orders right now, stated to defer to day team regarding transfusion since lab value is borderline.  ?

## 2021-10-12 NOTE — Discharge Planning (Signed)
Blood consent obtained. Spoke with [name] (daughter) and she provided consent. Dannielle Karvonen Rn 2nd witness.  ?

## 2021-10-12 NOTE — Discharge Summary (Addendum)
Physician Discharge Summary   Patient: Casey Tucker MRN: [id number] DOB: [birth year]  Admit date:     10/09/2021  Discharge date: 10/12/21  Discharge Physician: Pennie Banter   PCP: Karie Schwalbe, MD   Recommendations at discharge:    Follow up with Primary Care in 1-2 weeks Repeat CBC, BMP, Mg in 1-2 weeks Follow up with Dr. Cathie Hoops at [facility] regarding MDS Follow up with neurology as scheduled Follow up with Home Health PT  Discharge Diagnoses: Principal Problem:   UTI (urinary tract infection) Active Problems:   Parkinson's disease (HCC)   MDS (myelodysplastic syndrome) (HCC)   Acute metabolic encephalopathy   Iron deficiency anemia   CAP (community acquired pneumonia)   Constipation   Sepsis secondary to UTI [facility])   Hospital Course: Casey Tucker is a 81 y.o. male with medical history significant of Parkinson's disease, GERD, depression, kidney stone, Gilbert syndrome, anemia, MDS, who presented to the ED on 10/09/2021 with cough and altered mental status.  Evaluation in the ED consistent with UTI.  Chest xray concerning for possible LLL pneumonia. Patient was started on empiric Rocephin, Zithromax and admitted to the hospital pending cultures.  3/25: pt had fever of 102.2 F yesterday late afternoon.  3/26: no further fevers.  Patient's hemoglobin 6.9 this AM. Daughter consented to RBC transfusion.  This is due to MDS, there is no evidence of bleeding.     Patient demonstrates significant clinical improvement and mental status back to baseline.  He is medically stable for discharge this afternoon.    Assessment and Plan: * UTI (urinary tract infection) Present on admission. Urinalysis leukocytes, rare bacteria, 21-50 WBCs. Urine cx grew Enterococcus faecalis sensitive only to ampicillin, Macrobid and vancomycin -- Treated with empiric Rocephin -- Transitioned to Macrobid based on culture results -- Discharge with 5 days   Sepsis secondary to UTI  [facility]) Patient meets criteria for sepsis with fever, leukopenia in the setting of UTI and probable pneumonia.  No evidence of organ dysfunction, lactic acid normal. Sepsis physiology resolved --Treated with antibiotics as outlined -- Follow blood cultures -negative to date  Constipation Chronic in the setting of Parkinson's disease.  Bowel regimen ordered  CAP (community acquired pneumonia) Possible, patient did present with cough and chest x-ray showing a mild left lower lobe infiltration which could be infection or atelectasis.  Not requiring oxygen. -- Treated with empiric Rocephin and Zithromax in addition to supportive care -- Discharge with TWO more days oral antibiotics (Zithromax and Macrobid which covers the UTI)  Iron deficiency anemia Continue oral iron supplement  Acute metabolic encephalopathy Presented with confusion which has improved with IV antibiotics for UTI and possible pneumonia.  CT head was negative on admission.  No focal neurologic deficits.  Mental status returned to baseline with treatment of infection/s.  MDS (myelodysplastic syndrome) (HCC) Hemoglobin on admission 7.9 >> 7.0, >> 7.2 >> 6.9 this AM.  Daughter Olegario Messier agreeable and consented to blood transfusion today. --Transfuse 1 unit pRBC's --Follow up with Dr. Cathie Hoops --Repeat CBC at close follow up --Transfuse if hemoglobin less than 7  Parkinson's disease (HCC) Appears overall stable. Continue Sinemet. Follow-up with neurology as scheduled.         Consultants: none Procedures performed: none  Disposition: Home health Diet recommendation:  Discharge Diet Orders (From admission, onward)     Start     Ordered   10/12/21 0000  Diet - low sodium heart healthy        10/12/21  1358           Regular diet DISCHARGE MEDICATION: Allergies as of 10/12/2021       Reactions   Celebrex [celecoxib] Other (See Comments)   Per MAR   Demerol Other (See Comments)   Hallucinations    Amoxicillin  Rash   Penicillins Rash        Medication List     TAKE these medications    acetaminophen 325 MG tablet Commonly known as: TYLENOL Take 650 mg by mouth 4 (four) times daily.   azithromycin 500 MG tablet Commonly known as: Zithromax Take 1 tablet (500 mg total) by mouth daily for 2 days. Take 1 tablet daily for 3 days.   carbidopa-levodopa 25-100 MG tablet Commonly known as: SINEMET IR TAKE TWO TABLETS AT 7:30AM AND 1 TABLET AT 10AM  1PM  4PM  AND 7PM   escitalopram 10 MG tablet Commonly known as: LEXAPRO TAKE ONE TABLET BY MOUTH EVERY DAY   feeding supplement Liqd Take 237 mLs by mouth 3 (three) times daily between meals. What changed: additional instructions   ferrous sulfate 325 (65 FE) MG tablet TAKE ONE TABLET BY MOUTH EVERY OTHER DAY   gabapentin 100 MG capsule Commonly known as: NEURONTIN Take 1 capsule (100 mg total) by mouth at bedtime. Takes the place of the 300mg  dose What changed:  when to take this additional instructions   loratadine 10 MG tablet Commonly known as: CLARITIN TAKE ONE TABLET BY MOUTH EVERY DAY What changed: when to take this   Myrbetriq 25 MG Tb24 tablet Generic drug: mirabegron ER TAKE ONE TABLET BY MOUTH EVERY DAY What changed:  how much to take when to take this   nitrofurantoin (macrocrystal-monohydrate) 100 MG capsule Commonly known as: Macrobid Take 1 capsule (100 mg total) by mouth 2 (two) times daily for 5 days.   sennosides-docusate sodium 8.6-50 MG tablet Commonly known as: SENOKOT-S Take 2 tablets by mouth daily in the afternoon.        Discharge Exam: Filed Weights   10/09/21 1219  Weight: 70.3 kg   General exam: awake, alert, no acute distress, pleasantly confused HEENT: moist mucus membranes, hearing grossly normal  Respiratory system: CTAB, no wheezes, rales or rhonchi, normal respiratory effort. Cardiovascular system: normal S1/S2, RRR, no pedal edema, 2+ pedal pulses.   Gastrointestinal system:  soft, NT, ND, no HSM felt, +bowel sounds. Central nervous system: A&O x2. no gross focal neurologic deficits, normal speech Extremities: moves all, no edema, normal tone Skin: dry, intact, normal temperature Psychiatry: normal mood, congruent affect, abnormal judgment and insight due to dementia   Condition at discharge: stable  The results of significant diagnostics from this hospitalization (including imaging, microbiology, ancillary and laboratory) are listed below for reference.   Imaging Studies: CT Head Wo Contrast  Result Date: 10/09/2021 CLINICAL DATA:  Mental status change EXAM: CT HEAD WITHOUT CONTRAST TECHNIQUE: Contiguous axial images were obtained from the base of the skull through the vertex without intravenous contrast. RADIATION DOSE REDUCTION: This exam was performed according to the departmental dose-optimization program which includes automated exposure control, adjustment of the mA and/or kV according to patient size and/or use of iterative reconstruction technique. COMPARISON:  CT head 10/02/2020 FINDINGS: Brain: No evidence of acute infarction, hemorrhage, hydrocephalus, extra-axial collection or mass lesion/mass effect. Unilateral deep brain stimulator on the left extending into the thalamus unchanged. Vascular: Negative for hyperdense vessel Skull: No acute abnormality.  Left frontal burr hole Sinuses/Orbits: Paranasal sinuses clear. Bilateral cataract extraction  Other: None IMPRESSION: No acute intracranial abnormality. Unilateral deep brain stimulator on the left unchanged in position. Electronically Signed   By: Marlan Palau M.D.   On: 10/09/2021 14:15   DG Chest Port 1 View  Result Date: 10/09/2021 CLINICAL DATA:  Altered mental status EXAM: PORTABLE CHEST 1 VIEW COMPARISON:  10/02/2020 FINDINGS: Heart size and vascularity normal. Mild left lower lobe atelectasis. Right lung clear. No edema or effusion. Generator pack overlying the left chest for deep brain stimulator  unchanged. Chronic fracture with lack of bony union in the distal left clavicle, unchanged. Advanced degenerative change both shoulders. IMPRESSION: Mild left lower lobe atelectasis/infiltrate. Electronically Signed   By: Marlan Palau M.D.   On: 10/09/2021 14:16    Microbiology: Results for orders placed or performed during the hospital encounter of 10/09/21  Blood Cultures (routine x 2)     Status: None (Preliminary result)   Collection Time: 10/09/21 12:25 PM   Specimen: BLOOD  Result Value Ref Range Status   Specimen Description BLOOD LEFT AC  Final   Special Requests   Final    BOTTLES DRAWN AEROBIC AND ANAEROBIC Blood Culture results may not be optimal due to an excessive volume of blood received in culture bottles   Culture   Final    NO GROWTH 3 DAYS Performed at [facility], [address], [place], [place] [address]    Report Status PENDING  Incomplete  Blood Cultures (routine x 2)     Status: None (Preliminary result)   Collection Time: 10/09/21 12:25 PM   Specimen: BLOOD  Result Value Ref Range Status   Specimen Description BLOOD LEFT FA  Final   Special Requests   Final    BOTTLES DRAWN AEROBIC AND ANAEROBIC Blood Culture results may not be optimal due to an excessive volume of blood received in culture bottles   Culture   Final    NO GROWTH 3 DAYS Performed at [facility], [address], [place], [place] [address]    Report Status PENDING  Incomplete  Urine Culture     Status: Abnormal   Collection Time: 10/09/21 12:25 PM   Specimen: Urine, Random  Result Value Ref Range Status   Specimen Description   Final    URINE, RANDOM Performed at [facility], [address], [place], [place] [address]    Special Requests   Final    NONE Performed at [facility], [address] [place], [place] [address]    Culture 40,000 COLONIES/mL ENTEROCOCCUS FAECALIS (A)  Final   Report Status 10/12/2021 FINAL  Final   Organism ID,  Bacteria ENTEROCOCCUS FAECALIS (A)  Final      Susceptibility   Enterococcus faecalis - MIC*    AMPICILLIN <=2 SENSITIVE Sensitive     NITROFURANTOIN <=16 SENSITIVE Sensitive     VANCOMYCIN 1 SENSITIVE Sensitive     * 40,000 COLONIES/mL ENTEROCOCCUS FAECALIS  Resp Panel by RT-PCR (Flu A&B, Covid) Nasopharyngeal Swab     Status: None   Collection Time: 10/09/21  3:15 PM   Specimen: Nasopharyngeal Swab; Nasopharyngeal(NP) swabs in vial transport medium  Result Value Ref Range Status   SARS Coronavirus 2 by RT PCR NEGATIVE NEGATIVE Final    Comment: (NOTE) SARS-CoV-2 target nucleic acids are NOT DETECTED.  The SARS-CoV-2 RNA is generally detectable in upper respiratory specimens during the acute phase of infection. The lowest concentration of SARS-CoV-2 viral copies this assay can detect is 138 copies/mL. A negative result does  not preclude SARS-Cov-2 infection and should not be used as the sole basis for treatment or other patient management decisions. A negative result may occur with  improper specimen collection/handling, submission of specimen other than nasopharyngeal swab, presence of viral mutation(s) within the areas targeted by this assay, and inadequate number of viral copies(<138 copies/mL). A negative result must be combined with clinical observations, patient history, and epidemiological information. The expected result is Negative.  Fact Sheet for Patients:  BloggerCourse.com  Fact Sheet for Healthcare Providers:  SeriousBroker.it  This test is no t yet approved or cleared by the [name] FDA and  has been authorized for detection and/or diagnosis of SARS-CoV-2 by FDA under an Emergency Use Authorization (EUA). This EUA will remain  in effect (meaning this test can be used) for the duration of the COVID-19 declaration under Section 564(b)(1) of the Act, 21 U.S.C.section 360bbb-3(b)(1), unless the authorization is  terminated  or revoked sooner.       Influenza A by PCR NEGATIVE NEGATIVE Final   Influenza B by PCR NEGATIVE NEGATIVE Final    Comment: (NOTE) The Xpert Xpress SARS-CoV-2/FLU/RSV plus assay is intended as an aid in the diagnosis of influenza from Nasopharyngeal swab specimens and should not be used as a sole basis for treatment. Nasal washings and aspirates are unacceptable for Xpert Xpress SARS-CoV-2/FLU/RSV testing.  Fact Sheet for Patients: BloggerCourse.com  Fact Sheet for Healthcare Providers: SeriousBroker.it  This test is not yet approved or cleared by the [name] FDA and has been authorized for detection and/or diagnosis of SARS-CoV-2 by FDA under an Emergency Use Authorization (EUA). This EUA will remain in effect (meaning this test can be used) for the duration of the COVID-19 declaration under Section 564(b)(1) of the Act, 21 U.S.C. section 360bbb-3(b)(1), unless the authorization is terminated or revoked.  Performed at [facility], [address] [place], [place] [address]     Labs: CBC: Recent Labs  Lab 10/09/21 1225 10/10/21 4696 10/10/21 0959 10/11/21 0508 10/12/21 0410  WBC 5.7 3.9*  --  2.1* 2.6*  NEUTROABS 4.6  --   --   --   --   HGB 7.9* 7.0* 7.0* 7.2* 6.9*  HCT 24.4* 21.1* 21.2* 22.3* 21.4*  MCV 127.1* 125.6*  --  126.0* 125.1*  PLT 303 232  --  232 226   Basic Metabolic Panel: Recent Labs  Lab 10/09/21 1225 10/11/21 0508  NA 137 138  K 3.8 3.6  CL 102 107  CO2 30 23  GLUCOSE 100* 139*  BUN 21 17  CREATININE 0.81 0.72  CALCIUM 8.7* 8.1*   Liver Function Tests: Recent Labs  Lab 10/09/21 1225  AST 14*  ALT <5  ALKPHOS 51  BILITOT 1.7*  PROT 7.0  ALBUMIN 4.0   CBG: Recent Labs  Lab 10/10/21 0712 10/11/21 0744 10/12/21 0839  GLUCAP 98 94 87    Discharge time spent: greater than 30 minutes.  Signed: Pennie Banter, DO [facility] 10/12/2021

## 2021-10-12 NOTE — Progress Notes (Signed)
Discharge instructions discussed with caregiver at bedside. Caregiver states she understands discharge instructions and has no further questions. Patient to DC home with caregiver and follow up outpatient for labs. PIV removed no complications ?

## 2021-10-13 ENCOUNTER — Telehealth: Payer: Self-pay

## 2021-10-13 LAB — TYPE AND SCREEN
ABO/RH(D): A NEG
Antibody Screen: NEGATIVE
Unit division: 0

## 2021-10-13 LAB — BPAM RBC
Blood Product Expiration Date: 202304152359
ISSUE DATE / TIME: 202303261204
Unit Type and Rh: 600

## 2021-10-13 LAB — LEGIONELLA PNEUMOPHILA SEROGP 1 UR AG: L. pneumophila Serogp 1 Ur Ag: NEGATIVE

## 2021-10-13 NOTE — Telephone Encounter (Signed)
Transition Care Management Follow-up Telephone Call ?Date of discharge and from where: Casey Tucker 10-12-21 Dx: UTI ?How have you been since you were released from the hospital? Doing pretty good  ?Any questions or concerns? No ? ?Items Reviewed: ?Did the pt receive and understand the discharge instructions provided? Yes  ?Medications obtained and verified? Yes  ?Other? No  ?Any new allergies since your discharge? No  ?Dietary orders reviewed? Yes ?Do you have support at home? Yes  ? ?Home Care and Equipment/Supplies: ?Were home health services ordered? no ?If so, what is the name of the agency? na  ?Has the agency set up a time to come to the patient's home? not applicable ?Were any new equipment or medical supplies ordered?  No ?What is the name of the medical supply agency? na ?Were you able to get the supplies/equipment? not applicable ?Do you have any questions related to the use of the equipment or supplies? No ? ?Functional Questionnaire: (I = Independent and D = Dependent) ?ADLs: I ? ?Bathing/Dressing- I ? ?Meal Prep- I ? ?Eating- I ? ?Maintaining continence- I ? ?Transferring/Ambulation- I ? ?Managing Meds- I ? ?Follow up appointments reviewed: ? ?PCP Hospital f/u appt confirmed? Yes  Scheduled to see Dr Silvio Pate on 10-17-21 @ 12 noon ?[facility] f/u appt confirmed? no. ?Are transportation arrangements needed? No  ?If their condition worsens, is the pt aware to call PCP or go to the Emergency Dept.? Yes ?Was the patient provided with contact information for the PCP's office or ED? Yes ?Was to pt encouraged to call back with questions or concerns? Yes  ?

## 2021-10-13 NOTE — Progress Notes (Signed)
Verbal order from Dr. Arbutus Ped for [facility]. Patient discharged 3/26.  ?

## 2021-10-14 ENCOUNTER — Telehealth: Payer: Self-pay | Admitting: Internal Medicine

## 2021-10-14 DIAGNOSIS — M1612 Unilateral primary osteoarthritis, left hip: Secondary | ICD-10-CM | POA: Diagnosis not present

## 2021-10-14 DIAGNOSIS — G20 Parkinson's disease: Secondary | ICD-10-CM | POA: Diagnosis not present

## 2021-10-14 DIAGNOSIS — B952 Enterococcus as the cause of diseases classified elsewhere: Secondary | ICD-10-CM | POA: Diagnosis not present

## 2021-10-14 DIAGNOSIS — D469 Myelodysplastic syndrome, unspecified: Secondary | ICD-10-CM | POA: Diagnosis not present

## 2021-10-14 DIAGNOSIS — D509 Iron deficiency anemia, unspecified: Secondary | ICD-10-CM | POA: Diagnosis not present

## 2021-10-14 DIAGNOSIS — K5904 Chronic idiopathic constipation: Secondary | ICD-10-CM | POA: Diagnosis not present

## 2021-10-14 DIAGNOSIS — F32A Depression, unspecified: Secondary | ICD-10-CM | POA: Diagnosis not present

## 2021-10-14 DIAGNOSIS — K219 Gastro-esophageal reflux disease without esophagitis: Secondary | ICD-10-CM | POA: Diagnosis not present

## 2021-10-14 DIAGNOSIS — N390 Urinary tract infection, site not specified: Secondary | ICD-10-CM | POA: Diagnosis not present

## 2021-10-14 DIAGNOSIS — Z9181 History of falling: Secondary | ICD-10-CM | POA: Diagnosis not present

## 2021-10-14 DIAGNOSIS — L989 Disorder of the skin and subcutaneous tissue, unspecified: Secondary | ICD-10-CM | POA: Diagnosis not present

## 2021-10-14 LAB — CULTURE, BLOOD (ROUTINE X 2)
Culture: NO GROWTH
Culture: NO GROWTH

## 2021-10-14 NOTE — Telephone Encounter (Signed)
Is this okay?

## 2021-10-14 NOTE — Telephone Encounter (Signed)
Home Health verbal orders ?Caller Name:Jatana Edison Pace  ?Agency Name: [facility] ? ?Callback number:[phone]  ? ?Requesting OT/PT/Skilled nursing/Social Work/Speech: ? ?Reason:PT ? ?Frequency:1 W x 9 ? ?Please forward to [facility] pool or providers CMA ? ?

## 2021-10-15 NOTE — Telephone Encounter (Signed)
Called lvm for [facility] for verbal okay for home PT for Mr Britten.  ?

## 2021-10-17 ENCOUNTER — Ambulatory Visit (INDEPENDENT_AMBULATORY_CARE_PROVIDER_SITE_OTHER): Payer: Medicare Other | Admitting: Internal Medicine

## 2021-10-17 ENCOUNTER — Ambulatory Visit: Payer: Medicare Other | Admitting: Internal Medicine

## 2021-10-17 ENCOUNTER — Encounter: Payer: Self-pay | Admitting: Internal Medicine

## 2021-10-17 DIAGNOSIS — M48062 Spinal stenosis, lumbar region with neurogenic claudication: Secondary | ICD-10-CM

## 2021-10-17 DIAGNOSIS — J181 Lobar pneumonia, unspecified organism: Secondary | ICD-10-CM

## 2021-10-17 DIAGNOSIS — G20 Parkinson's disease: Secondary | ICD-10-CM | POA: Diagnosis not present

## 2021-10-17 DIAGNOSIS — D469 Myelodysplastic syndrome, unspecified: Secondary | ICD-10-CM

## 2021-10-17 DIAGNOSIS — N390 Urinary tract infection, site not specified: Secondary | ICD-10-CM | POA: Diagnosis not present

## 2021-10-17 NOTE — Assessment & Plan Note (Signed)
LLL findings on CXR ?No clear increased cough, fever or SOB though (just encephalopathy) ?Done with rocephin and azithromycin ?Will plan to repeat the CXR next month ?

## 2021-10-17 NOTE — Assessment & Plan Note (Signed)
Uses the gabapentin '100mg'$  four times a day ?

## 2021-10-17 NOTE — Assessment & Plan Note (Signed)
With chronic anemia ?Did get blood transfusion in hospital ?

## 2021-10-17 NOTE — Assessment & Plan Note (Signed)
40K Enterococcus ?I am concerned about symptoms suggesting retention (and increased UTI risk)---will try off the myrbetriq ?Done with nitrofurantoin today ?

## 2021-10-17 NOTE — Assessment & Plan Note (Signed)
Doing better at home since they have accepted daily help ?

## 2021-10-17 NOTE — Progress Notes (Signed)
? ?Subjective:  ? ? Patient ID: Casey Tucker, male    DOB: 05-Dec-1940, 81 y.o.   MRN: [id number] ? ?HPI ?Here for hospital follow up ?With aide [name]--with them for 5 months (5 days a week from 11-7PM) ? ? ?[name] came in one morning---he was "lethargic and out of it" ?Nonsense speech ?She called rescue ?No fever, SOB. Does have a chronic cough which hadn't changed ?Some concern about UTI due to urgency (then not able to go) ? ?Had questionable LLL pneumonia ?Urine culture 40,000 Enterococcus ?Rocephin and azithromycin--then nitrofurantoin (last day) ? ?Aide helps with all ADLs ?He eats independently once the food is put in front of him ?They have caregivers 8 hours per day--7 days a week ?No incontinence ?Wears pull up only when going out ?Condom catheter at night ? ?Using the gabapentin four times a day---'100mg'$  ?No sleepiness  ? ?Does get sleepy with the sinemet ? ?Got transfused for hemoglobin 6.9 ?Low energy levels ? ?Current Outpatient Medications on File Prior to Visit  ?Medication Sig Dispense Refill  ? acetaminophen (TYLENOL) 325 MG tablet Take 650 mg by mouth 4 (four) times daily.    ? carbidopa-levodopa (SINEMET IR) 25-100 MG tablet TAKE TWO TABLETS AT 7:30AM AND 1 TABLET AT 10AM  1PM  4PM  AND 7PM 540 tablet 1  ? gabapentin (NEURONTIN) 100 MG capsule Take 1 capsule (100 mg total) by mouth at bedtime. Takes the place of the '300mg'$  dose (Patient taking differently: Take 100 mg by mouth 4 (four) times daily.) 30 capsule 11  ? loratadine (CLARITIN) 10 MG tablet TAKE ONE TABLET BY MOUTH EVERY DAY 100 tablet 3  ? mirabegron ER (MYRBETRIQ) 25 MG TB24 tablet TAKE ONE TABLET BY MOUTH EVERY DAY (Patient taking differently: Take 25 mg by mouth at bedtime.) 31 tablet 11  ? nitrofurantoin, macrocrystal-monohydrate, (MACROBID) 100 MG capsule Take 1 capsule (100 mg total) by mouth 2 (two) times daily for 5 days. 10 capsule 0  ? sennosides-docusate sodium (SENOKOT-S) 8.6-50 MG tablet Take 2 tablets by mouth daily in  the afternoon.    ? ?No current facility-administered medications on file prior to visit.  ? ? ?Allergies  ?Allergen Reactions  ? Celebrex [Celecoxib] Other (See Comments)  ?  Per MAR  ? Demerol Other (See Comments)  ?  Hallucinations   ? Amoxicillin Rash  ? Penicillins Rash  ? ? ?Past Medical History:  ?Diagnosis Date  ? Arthritis   ? left hip   ? GERD (gastroesophageal reflux disease) Pre 2002  ? Mild  ? History of kidney stones   ? History of MRI of brain and brain stem 12/12/2004  ? 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 Parkinson disease ([place]) 2004  ? Slowly progressive  ? Ruptured appendix teens  ? ? ?Past Surgical History:  ?Procedure Laterality Date  ? APPENDECTOMY    ? CATARACT EXTRACTION W/ INTRAOCULAR LENS  IMPLANT, BILATERAL  2017  ? DEEP BRAIN STIMULATOR PLACEMENT  8/14  ? L STN  ? DOPPLER ECHOCARDIOGRAPHY  01/10/2009  ? LV NML Mild LVH EF 60-65% aortic sclerosis w/0 stenosis   ? JOINT REPLACEMENT    ? left hip replacement 08/14/11/[place]   ? SUBTHALAMIC STIMULATOR BATTERY REPLACEMENT Left 03/25/2017  ? Procedure: Deep Brain Stimulator battery replacement;  Surgeon: Erline Levine, MD;  Location: [place];  Service: Neurosurgery;  Laterality: Left;  left  ? SUBTHALAMIC STIMULATOR BATTERY REPLACEMENT N/A 11/22/2020  ? Procedure: Change Implantable Pulse Generator Battery;  Surgeon: Erline Levine,  MD;  Location: [place];  Service: Neurosurgery;  Laterality: N/A;  3C/RM 21  ? TONSILLECTOMY    ? TOTAL HIP REVISION  08/27/2011  ? Procedure: TOTAL HIP REVISION;  Surgeon: Mauri Pole, MD;  Location: [facility];  Service: Orthopedics;  Laterality: Left;  ? ? ?Family History  ?Problem Relation Age of Onset  ? Arthritis Mother   ?     knee replacement  ? COPD Father   ?     emphysema, smoker  ? Heart disease Father   ?     CHF  ? Healthy Sister   ? Alcohol abuse Paternal Uncle   ? Rheum arthritis Daughter   ? ? ?Social History  ? ?Socioeconomic History  ? Marital status: Married  ?  Spouse name:  Not on file  ? Number of children: 2  ? Years of education: Not on file  ? Highest education level: Doctorate  ?Occupational History  ? Occupation: Optometrist  ?  Comment: medically retired  ?Tobacco Use  ? Smoking status: Never  ? Smokeless tobacco: Never  ?Vaping Use  ? Vaping Use: Never used  ?Substance and Sexual Activity  ? Alcohol use: Not Currently  ?  Alcohol/week: 7.0 standard drinks  ?  Types: 7 Standard drinks or equivalent per week  ? Drug use: No  ? Sexual activity: Not on file  ?Other Topics Concern  ? Not on file  ?Social History Narrative  ? Married, wife lives at home patient stays at [facility]  ? 2 daughters  ? Right handed  ? Has living will  ? Wife has health care POA---then daughters  ? Would still accept CPR--but no prolonged artificial means (ventilator or tube feeds)  ? ?Social Determinants of Health  ? ?Financial Resource Strain: Not on file  ?Food Insecurity: Not on file  ?Transportation Needs: Not on file  ?Physical Activity: Not on file  ?Stress: Not on file  ?Social Connections: Not on file  ?Intimate Partner Violence: Not on file  ? ?Review of Systems ?Eating okay ?Weight is up considerably with allowing aide to prepare food, etc ?Sleeps okay ?Bowels slow. Takes senna daily. Bowels move daily. ?   ?Objective:  ? Physical Exam ?Constitutional:   ?   Appearance: Normal appearance.  ?Cardiovascular:  ?   Rate and Rhythm: Normal rate and regular rhythm.  ?   Heart sounds: No murmur heard. ?  No gallop.  ?Pulmonary:  ?   Effort: Pulmonary effort is normal.  ?   Breath sounds: Normal breath sounds. No wheezing or rales.  ?Abdominal:  ?   Palpations: Abdomen is soft.  ?   Tenderness: There is no abdominal tenderness.  ?Musculoskeletal:  ?   Cervical back: Neck supple.  ?   Right lower leg: No edema.  ?   Left lower leg: No edema.  ?Lymphadenopathy:  ?   Cervical: No cervical adenopathy.  ?Neurological:  ?   Mental Status: He is alert.  ?   Comments: Tremor--mostly in hands ?Some bradykinesia   ?Psychiatric:     ?   Mood and Affect: Mood normal.     ?   Behavior: Behavior normal.  ?  ? ? ? ? ?   ?Assessment & Plan:  ? ?

## 2021-10-20 ENCOUNTER — Encounter: Payer: Self-pay | Admitting: Neurology

## 2021-10-20 NOTE — Telephone Encounter (Signed)
[facility] [phone] option 2 ? ?Relayed that orders below were approved, but they also need to know if this patient has been diagnosed with dementia ? ? ?

## 2021-10-21 NOTE — Telephone Encounter (Signed)
Left message on verified VM for Casey Tucker with the information she requested. ?

## 2021-10-23 DIAGNOSIS — D509 Iron deficiency anemia, unspecified: Secondary | ICD-10-CM | POA: Diagnosis not present

## 2021-10-23 DIAGNOSIS — N390 Urinary tract infection, site not specified: Secondary | ICD-10-CM | POA: Diagnosis not present

## 2021-10-23 DIAGNOSIS — B952 Enterococcus as the cause of diseases classified elsewhere: Secondary | ICD-10-CM | POA: Diagnosis not present

## 2021-10-23 DIAGNOSIS — G20 Parkinson's disease: Secondary | ICD-10-CM | POA: Diagnosis not present

## 2021-10-23 DIAGNOSIS — D469 Myelodysplastic syndrome, unspecified: Secondary | ICD-10-CM | POA: Diagnosis not present

## 2021-10-28 ENCOUNTER — Ambulatory Visit: Payer: Medicare Other | Admitting: Internal Medicine

## 2021-10-29 DIAGNOSIS — B952 Enterococcus as the cause of diseases classified elsewhere: Secondary | ICD-10-CM | POA: Diagnosis not present

## 2021-10-29 DIAGNOSIS — D509 Iron deficiency anemia, unspecified: Secondary | ICD-10-CM | POA: Diagnosis not present

## 2021-10-29 DIAGNOSIS — D469 Myelodysplastic syndrome, unspecified: Secondary | ICD-10-CM | POA: Diagnosis not present

## 2021-10-29 DIAGNOSIS — N390 Urinary tract infection, site not specified: Secondary | ICD-10-CM | POA: Diagnosis not present

## 2021-10-29 DIAGNOSIS — G20 Parkinson's disease: Secondary | ICD-10-CM | POA: Diagnosis not present

## 2021-11-06 DIAGNOSIS — D469 Myelodysplastic syndrome, unspecified: Secondary | ICD-10-CM | POA: Diagnosis not present

## 2021-11-06 DIAGNOSIS — G20 Parkinson's disease: Secondary | ICD-10-CM | POA: Diagnosis not present

## 2021-11-06 DIAGNOSIS — N390 Urinary tract infection, site not specified: Secondary | ICD-10-CM | POA: Diagnosis not present

## 2021-11-06 DIAGNOSIS — B952 Enterococcus as the cause of diseases classified elsewhere: Secondary | ICD-10-CM | POA: Diagnosis not present

## 2021-11-06 DIAGNOSIS — D509 Iron deficiency anemia, unspecified: Secondary | ICD-10-CM | POA: Diagnosis not present

## 2021-11-11 DIAGNOSIS — G20 Parkinson's disease: Secondary | ICD-10-CM | POA: Diagnosis not present

## 2021-11-11 DIAGNOSIS — Z85828 Personal history of other malignant neoplasm of skin: Secondary | ICD-10-CM | POA: Diagnosis not present

## 2021-11-11 DIAGNOSIS — D044 Carcinoma in situ of skin of scalp and neck: Secondary | ICD-10-CM | POA: Diagnosis not present

## 2021-11-11 DIAGNOSIS — D2262 Melanocytic nevi of left upper limb, including shoulder: Secondary | ICD-10-CM | POA: Diagnosis not present

## 2021-11-11 DIAGNOSIS — B952 Enterococcus as the cause of diseases classified elsewhere: Secondary | ICD-10-CM | POA: Diagnosis not present

## 2021-11-11 DIAGNOSIS — L570 Actinic keratosis: Secondary | ICD-10-CM | POA: Diagnosis not present

## 2021-11-11 DIAGNOSIS — D485 Neoplasm of uncertain behavior of skin: Secondary | ICD-10-CM | POA: Diagnosis not present

## 2021-11-11 DIAGNOSIS — D2261 Melanocytic nevi of right upper limb, including shoulder: Secondary | ICD-10-CM | POA: Diagnosis not present

## 2021-11-11 DIAGNOSIS — D469 Myelodysplastic syndrome, unspecified: Secondary | ICD-10-CM | POA: Diagnosis not present

## 2021-11-11 DIAGNOSIS — X32XXXA Exposure to sunlight, initial encounter: Secondary | ICD-10-CM | POA: Diagnosis not present

## 2021-11-11 DIAGNOSIS — N390 Urinary tract infection, site not specified: Secondary | ICD-10-CM | POA: Diagnosis not present

## 2021-11-11 DIAGNOSIS — D225 Melanocytic nevi of trunk: Secondary | ICD-10-CM | POA: Diagnosis not present

## 2021-11-11 DIAGNOSIS — D509 Iron deficiency anemia, unspecified: Secondary | ICD-10-CM | POA: Diagnosis not present

## 2021-11-13 DIAGNOSIS — N390 Urinary tract infection, site not specified: Secondary | ICD-10-CM | POA: Diagnosis not present

## 2021-11-13 DIAGNOSIS — D509 Iron deficiency anemia, unspecified: Secondary | ICD-10-CM | POA: Diagnosis not present

## 2021-11-13 DIAGNOSIS — B952 Enterococcus as the cause of diseases classified elsewhere: Secondary | ICD-10-CM | POA: Diagnosis not present

## 2021-11-13 DIAGNOSIS — G20 Parkinson's disease: Secondary | ICD-10-CM | POA: Diagnosis not present

## 2021-11-13 DIAGNOSIS — K5904 Chronic idiopathic constipation: Secondary | ICD-10-CM | POA: Diagnosis not present

## 2021-11-13 DIAGNOSIS — Z9181 History of falling: Secondary | ICD-10-CM | POA: Diagnosis not present

## 2021-11-13 DIAGNOSIS — D469 Myelodysplastic syndrome, unspecified: Secondary | ICD-10-CM | POA: Diagnosis not present

## 2021-11-13 DIAGNOSIS — K219 Gastro-esophageal reflux disease without esophagitis: Secondary | ICD-10-CM | POA: Diagnosis not present

## 2021-11-13 DIAGNOSIS — F32A Depression, unspecified: Secondary | ICD-10-CM | POA: Diagnosis not present

## 2021-11-13 DIAGNOSIS — M1612 Unilateral primary osteoarthritis, left hip: Secondary | ICD-10-CM | POA: Diagnosis not present

## 2021-11-13 DIAGNOSIS — L989 Disorder of the skin and subcutaneous tissue, unspecified: Secondary | ICD-10-CM | POA: Diagnosis not present

## 2021-11-17 DIAGNOSIS — N390 Urinary tract infection, site not specified: Secondary | ICD-10-CM | POA: Diagnosis not present

## 2021-11-17 DIAGNOSIS — D509 Iron deficiency anemia, unspecified: Secondary | ICD-10-CM | POA: Diagnosis not present

## 2021-11-17 DIAGNOSIS — B952 Enterococcus as the cause of diseases classified elsewhere: Secondary | ICD-10-CM | POA: Diagnosis not present

## 2021-11-17 DIAGNOSIS — D469 Myelodysplastic syndrome, unspecified: Secondary | ICD-10-CM | POA: Diagnosis not present

## 2021-11-17 DIAGNOSIS — G20 Parkinson's disease: Secondary | ICD-10-CM | POA: Diagnosis not present

## 2021-11-21 ENCOUNTER — Ambulatory Visit (INDEPENDENT_AMBULATORY_CARE_PROVIDER_SITE_OTHER): Payer: Medicare Other | Admitting: Internal Medicine

## 2021-11-21 ENCOUNTER — Encounter: Payer: Self-pay | Admitting: Internal Medicine

## 2021-11-21 ENCOUNTER — Ambulatory Visit (INDEPENDENT_AMBULATORY_CARE_PROVIDER_SITE_OTHER)
Admission: RE | Admit: 2021-11-21 | Discharge: 2021-11-21 | Disposition: A | Discharge status: Home / Self Care | Payer: Medicare Other | Source: Ambulatory Visit | Attending: Internal Medicine | Admitting: Internal Medicine

## 2021-11-21 VITALS — BP 122/70 | HR 86 | Temp 97.2°F | Ht 69.0 in

## 2021-11-21 DIAGNOSIS — F0150 Vascular dementia without behavioral disturbance: Secondary | ICD-10-CM | POA: Diagnosis not present

## 2021-11-21 DIAGNOSIS — G20 Parkinson's disease: Secondary | ICD-10-CM | POA: Diagnosis not present

## 2021-11-21 DIAGNOSIS — J181 Lobar pneumonia, unspecified organism: Secondary | ICD-10-CM

## 2021-11-21 DIAGNOSIS — R918 Other nonspecific abnormal finding of lung field: Secondary | ICD-10-CM | POA: Diagnosis not present

## 2021-11-21 DIAGNOSIS — J439 Emphysema, unspecified: Secondary | ICD-10-CM | POA: Diagnosis not present

## 2021-11-21 NOTE — Progress Notes (Signed)
? ?Subjective:  ? ? Patient ID: Casey Tucker, male    DOB: Aug 18, 1940, 81 y.o.   MRN: [id number] ? ?HPI ?Here with aide [name] for follow up of pneumonia ? ?He is better ?Back to normal ?Cough has finally gone away --in the past week or so ? ?[name] doesn't note any confusion ?No urinary symptoms ? ?Current Outpatient Medications on File Prior to Visit  ?Medication Sig Dispense Refill  ? acetaminophen (TYLENOL) 325 MG tablet Take 650 mg by mouth 4 (four) times daily.    ? carbidopa-levodopa (SINEMET IR) 25-100 MG tablet TAKE TWO TABLETS AT 7:30AM AND 1 TABLET AT 10AM  1PM  4PM  AND 7PM 540 tablet 1  ? gabapentin (NEURONTIN) 100 MG capsule Take 1 capsule (100 mg total) by mouth at bedtime. Takes the place of the '300mg'$  dose (Patient taking differently: Take 100 mg by mouth 4 (four) times daily.) 30 capsule 11  ? loratadine (CLARITIN) 10 MG tablet TAKE ONE TABLET BY MOUTH EVERY DAY 100 tablet 3  ? sennosides-docusate sodium (SENOKOT-S) 8.6-50 MG tablet Take 2 tablets by mouth daily in the afternoon.    ? ?No current facility-administered medications on file prior to visit.  ? ? ?Allergies  ?Allergen Reactions  ? Celebrex [Celecoxib] Other (See Comments)  ?  Per MAR  ? Demerol Other (See Comments)  ?  Hallucinations   ? Amoxicillin Rash  ? Penicillins Rash  ? ? ?Past Medical History:  ?Diagnosis Date  ? Arthritis   ? left hip   ? GERD (gastroesophageal reflux disease) Pre 2002  ? Mild  ? History of kidney stones   ? History of MRI of brain and brain stem 12/12/2004  ? 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 Parkinson disease ([place]) 2004  ? Slowly progressive  ? Ruptured appendix teens  ? ? ?Past Surgical History:  ?Procedure Laterality Date  ? APPENDECTOMY    ? CATARACT EXTRACTION W/ INTRAOCULAR LENS  IMPLANT, BILATERAL  2017  ? DEEP BRAIN STIMULATOR PLACEMENT  8/14  ? L STN  ? DOPPLER ECHOCARDIOGRAPHY  01/10/2009  ? LV NML Mild LVH EF 60-65% aortic sclerosis w/0 stenosis   ? JOINT  REPLACEMENT    ? left hip replacement 08/14/11/[facility]   ? SUBTHALAMIC STIMULATOR BATTERY REPLACEMENT Left 03/25/2017  ? Procedure: Deep Brain Stimulator battery replacement;  Surgeon: Erline Levine, MD;  Location: [facility];  Service: Neurosurgery;  Laterality: Left;  left  ? SUBTHALAMIC STIMULATOR BATTERY REPLACEMENT N/A 11/22/2020  ? Procedure: Change Implantable Pulse Generator Battery;  Surgeon: Erline Levine, MD;  Location: [facility];  Service: Neurosurgery;  Laterality: N/A;  3C/RM 21  ? TONSILLECTOMY    ? TOTAL HIP REVISION  08/27/2011  ? Procedure: TOTAL HIP REVISION;  Surgeon: Mauri Pole, MD;  Location: [facility];  Service: Orthopedics;  Laterality: Left;  ? ? ?Family History  ?Problem Relation Age of Onset  ? Arthritis Mother   ?     knee replacement  ? COPD Father   ?     emphysema, smoker  ? Heart disease Father   ?     CHF  ? Healthy Sister   ? Alcohol abuse Paternal Uncle   ? Rheum arthritis Daughter   ? ? ?Social History  ? ?Socioeconomic History  ? Marital status: Married  ?  Spouse name: Not on file  ? Number of children: 2  ? Years of education: Not on file  ? Highest education level: Doctorate  ?Occupational History  ?  Occupation: Optometrist  ?  Comment: medically retired  ?Tobacco Use  ? Smoking status: Never  ? Smokeless tobacco: Never  ?Vaping Use  ? Vaping Use: Never used  ?Substance and Sexual Activity  ? Alcohol use: Not Currently  ?  Alcohol/week: 7.0 standard drinks  ?  Types: 7 Standard drinks or equivalent per week  ? Drug use: No  ? Sexual activity: Not on file  ?Other Topics Concern  ? Not on file  ?Social History Narrative  ? Married, wife lives at home patient stays at [facility]  ? 2 daughters  ? Right handed  ? Has living will  ? Wife has health care POA---then daughters  ? Would still accept CPR--but no prolonged artificial means (ventilator or tube feeds)  ? ?Social Determinants of Health  ? ?Financial Resource Strain: Not on file  ?Food Insecurity: Not on file  ?Transportation Needs: Not  on file  ?Physical Activity: Not on file  ?Stress: Not on file  ?Social Connections: Not on file  ?Intimate Partner Violence: Not on file  ? ?Review of Systems ?Has had hallucinations and confusion with extra sinemet ?Eating well still ?   ?Objective:  ? Physical Exam ?Constitutional:   ?   Appearance: Normal appearance.  ?Pulmonary:  ?   Effort: Pulmonary effort is normal.  ?   Breath sounds: Normal breath sounds. No wheezing or rales.  ?Musculoskeletal:  ?   Cervical back: Neck supple.  ?Lymphadenopathy:  ?   Cervical: No cervical adenopathy.  ?Neurological:  ?   Mental Status: He is alert.  ?   Comments: Increased tremor in hands/arms ?Speech more slurred  ?(Due for sinemet in 15 minutes)  ?  ? ? ? ? ?   ?Assessment & Plan:  ? ?

## 2021-11-21 NOTE — Assessment & Plan Note (Signed)
He is "off" now ?He is asking for an increase---but has limited judgement and insight about the extent of his side effects with extra sinemet ?

## 2021-11-21 NOTE — Assessment & Plan Note (Signed)
Does seem clinically resolved now ?Will recheck CXR today ?

## 2021-11-21 NOTE — Assessment & Plan Note (Signed)
Doing better now with full time caregiver---eating well, managing his meds/finances, etc ?Okay to stay in home with the support ?

## 2021-11-26 DIAGNOSIS — B952 Enterococcus as the cause of diseases classified elsewhere: Secondary | ICD-10-CM | POA: Diagnosis not present

## 2021-11-26 DIAGNOSIS — D509 Iron deficiency anemia, unspecified: Secondary | ICD-10-CM | POA: Diagnosis not present

## 2021-11-26 DIAGNOSIS — N390 Urinary tract infection, site not specified: Secondary | ICD-10-CM | POA: Diagnosis not present

## 2021-11-26 DIAGNOSIS — G20 Parkinson's disease: Secondary | ICD-10-CM | POA: Diagnosis not present

## 2021-11-26 DIAGNOSIS — D469 Myelodysplastic syndrome, unspecified: Secondary | ICD-10-CM | POA: Diagnosis not present

## 2021-12-03 DIAGNOSIS — D469 Myelodysplastic syndrome, unspecified: Secondary | ICD-10-CM | POA: Diagnosis not present

## 2021-12-03 DIAGNOSIS — B952 Enterococcus as the cause of diseases classified elsewhere: Secondary | ICD-10-CM | POA: Diagnosis not present

## 2021-12-03 DIAGNOSIS — N390 Urinary tract infection, site not specified: Secondary | ICD-10-CM | POA: Diagnosis not present

## 2021-12-03 DIAGNOSIS — D509 Iron deficiency anemia, unspecified: Secondary | ICD-10-CM | POA: Diagnosis not present

## 2021-12-03 DIAGNOSIS — G20 Parkinson's disease: Secondary | ICD-10-CM | POA: Diagnosis not present

## 2021-12-09 ENCOUNTER — Encounter: Payer: Self-pay | Admitting: Neurology

## 2021-12-10 DIAGNOSIS — N390 Urinary tract infection, site not specified: Secondary | ICD-10-CM | POA: Diagnosis not present

## 2021-12-10 DIAGNOSIS — D469 Myelodysplastic syndrome, unspecified: Secondary | ICD-10-CM | POA: Diagnosis not present

## 2021-12-10 DIAGNOSIS — B952 Enterococcus as the cause of diseases classified elsewhere: Secondary | ICD-10-CM | POA: Diagnosis not present

## 2021-12-10 DIAGNOSIS — D509 Iron deficiency anemia, unspecified: Secondary | ICD-10-CM | POA: Diagnosis not present

## 2021-12-10 DIAGNOSIS — G20 Parkinson's disease: Secondary | ICD-10-CM | POA: Diagnosis not present

## 2021-12-25 ENCOUNTER — Telehealth: Payer: Self-pay

## 2021-12-25 NOTE — Telephone Encounter (Signed)
Received a call from [facility], triage outcome was 911 and patient is refusing.

## 2021-12-25 NOTE — Telephone Encounter (Signed)
Agree with ER dispo.  Thanks. Routed to PCP as FYI upon return to office.  Please get update on patient on 12/26/21.

## 2021-12-25 NOTE — Telephone Encounter (Signed)
Received a call from [name], the patients daughter who states that she got a call from the caregiver who was concerned for her dads health. Spoke with Mickel Baas who is from [place], she arrived at the house and when [place], patients wife answered the door she has this worry look on her face and expressed concern stating that Gershon Mussel did not have a good night, didn't explain what happened but said that Gershon Mussel was not responding to her, looking at her with confusion as if he didn't recognize her, he was weak when standing and was dependent on help when walking this morning. Mickel Baas asked patient how he felt to which he responded with "I feel disoriented". Mickel Baas asked if he wanted to have a nurse come out to check on him to which he declined. Mickel Baas has noticed within the time being there that he has a yellowish pigment color, he is 60% dependent on her when moving, noticed he was more shaky than usual. This is concerning Mickel Baas as just yesterday patient was able to move around on his own, not very dependent on anyone. Mickel Baas contacted the Magro's daughter, laurie and updated her. Patient has refused for [name] to call 911, as he has a fear of hospitals and is wondering what they should do.   Had [place] and Gershon Mussel speak with the [facility] Triage.

## 2021-12-25 NOTE — Telephone Encounter (Addendum)
I spoke with [name] (DPR signed) and she was just leaving work and I reviewed the note from Ander Purpura and [name] said that she was going to call pts home and speak with nurse that is there in the home and [name] thinks she will be able to persuade pt to go to ED by ambulance. ED precautions given again and [name] voiced understanding and [name] will cb with update on how pt is doing on 12/26/21. Dr Silvio Pate is out of office and will send note to Dr Damita Dunnings who is in office, [name] CMA and will teams Lattie Haw CMA who is working with Dr Damita Dunnings today.   Erin Day - Client TELEPHONE ADVICE RECORD [facility] Patient Name: Casey Tucker [facility] Gender: Male DOB: 1940/11/16 Age: 81 Y 2 M 18 D Return Phone Number: [phone] (Primary) Address: City/ State/ Zip: [place]  [address] Client Sarles Day - Client Client Site [facility] - Day Provider AA - PHYSICIAN, Verita Schneiders- MD Contact Type Call Who Is Calling Patient / Member / Family / Caregiver Call Type Triage / Clinical Caller Name Casey Tucker Relationship To Patient Spouse Return Phone Number [phone] (Primary) Chief Complaint CONFUSION - new onset Reason for Call Symptomatic / Request for Health Information Initial Comment Caller is calling on behalf of a patient. The patient states that yesterday he was completely fine. Today they are concerned about a minor skin, tense skin color, he is very weak, and the wife who lives with him, the moment the nurse looked in, he did not have a good night, she couldn't get him to reply, and she was just looking at her with confusion, he was very weak. The nurse asked him how he felt and he wanted to have him triaged. Translation No Nurse Assessment Nurse: Rolin Barry, RN, Levada Dy Date/Time (Eastern Time): 12/25/2021 3:33:01 PM Confirm and document reason for call. If symptomatic, describe symptoms. ---Caller is calling on behalf of a  patient. The patient states that yesterday he was completely fine. Today they are concerned about a minor skin, tense skin color, he is very weak, and the wife who lives with him, the moment the nurse looked in, he did not have a good night, she couldn't get him to reply, and she was just looking at her with confusion, he was very weak. The nurse asked him how he felt and he wanted to have him triaged. Nurse at the home advised that he is very weak and pale. Does the patient have any new or worsening symptoms? ---Yes Will a triage be completed? ---Yes Related visit to physician within the last 2 weeks? ---No Does the PT have any chronic conditions? (i.e. diabetes, asthma, this includes High risk factors for pregnancy, etc.) ---Yes List chronic conditions. ---Parkinson's / dementia low blood pressure blood disorder Is this a behavioral health or substance abuse call? ---No PLEASE NOTE: All timestamps contained within this report are represented as [place] Standard Time. CONFIDENTIALTY NOTICE: This fax transmission is intended only for the addressee. It contains information that is legally privileged, confidential or otherwise protected from use or disclosure. If you are not the intended recipient, you are strictly prohibited from reviewing, disclosing, copying using or disseminating any of this information or taking any action in reliance on or regarding this information. If you have received this fax in error, please notify us immediately by telephone so that we can arrange for its return to [place]. Phone: [phone], Toll-Free: [phone], Fax: [phone]  Page: 2 of 2 Call Id: [id number] Nurse Assessment Guidelines Guideline Title Affirmed Question Affirmed Notes Nurse Date/Time Eilene Ghazi Time) Pale Skin Unable to walk, or can only walk with assistance (e.g., requires support) Deaton, RN, Levada Dy 12/25/2021 3:35:37 PM Disp. Time Eilene Ghazi Time) Disposition Final User 12/25/2021  3:29:45 PM Send to Urgent Queue Abigail Butts 12/25/2021 3:30:12 PM Send to Urgent Queue Abigail Butts 12/25/2021 3:58:02 PM 911 Outcome Documentation Deaton, RN, Levada Dy Reason: Caller refused. 12/25/2021 3:44:48 PM Call EMS 911 Now Yes Deaton, RN, Cindee Lame Disagree/Comply Disagree Caller Understands No PreDisposition Did not know what to do Care Advice Given Per Guideline CALL EMS 911 NOW: * Immediate medical attention is needed. You need to hang up and call 911 (or an ambulance). * Triager Discretion: I'll call you back in a few minutes to be sure you were able to reach them. FIRST AID - LIE DOWN FOR SHOCK: * Lie down with the feet elevated. * Reason: Treatment for shock. CARE ADVICE given per Pale Skin (Adult) guideline. Comments User: Saverio Danker, RN Date/Time Eilene Ghazi Time): 12/25/2021 3:55:11 PM Daughter was on the phone from [place], care giver on the phone , patient advised that he did not want to go to the ED. User: Saverio Danker, RN Date/Time Eilene Ghazi Time): 12/25/2021 4:02:19 PM Nurse aid advised that upon her arrival to the home today that the wife stopped her and told her that he did not have a good night, advised that he was more confused last night and not responding to her like normal. Nurse aide advised that his color was pale, he told her that he did not feel well and he was very weak. Aide advised that his weakness was severe, she had to help him up. Caller refused to call 911 or go to the ED. Daughter is his primary contact, she is in [place], care giver is with father, Mickel Baas. Office , Danielle advised at the backline of the refusal. She will have someone call the home and the daughter. Referrals GO TO FACILITY REFUSE

## 2021-12-26 ENCOUNTER — Encounter: Payer: Self-pay | Admitting: Internal Medicine

## 2021-12-26 NOTE — Telephone Encounter (Signed)
See mychart message. Patient is doing better. Sent to Dr Damita Dunnings and PCP box as Casey Tucker

## 2021-12-26 NOTE — Telephone Encounter (Signed)
Noted. Routed to PCP as FYI.

## 2021-12-26 NOTE — Telephone Encounter (Signed)
Noted. Thanks.

## 2021-12-29 NOTE — Telephone Encounter (Signed)
Spoke to pt's daughter, [name]. She just spoke to the pt's wife and she said he is doing much better.

## 2022-01-12 ENCOUNTER — Telehealth: Payer: Self-pay | Admitting: Internal Medicine

## 2022-01-12 NOTE — Telephone Encounter (Signed)
Vergia Alcon called from [employer] and [employer], They said that patients death certificate is ready to sign on [employer] death certificate. They need as soon as possible because they need to get an authorization done for cremation. Call back if needed is [phone], just ask for  Sink if you do call

## 2022-01-12 NOTE — Telephone Encounter (Signed)
Death certificate done Call to daughter---left voice mail Call to home/wife---no answer

## 2022-01-17 DEATH — deceased

## 2022-02-26 ENCOUNTER — Encounter: Payer: Medicare Other | Admitting: Neurology

## 2022-03-02 ENCOUNTER — Encounter: Payer: Medicare Other | Admitting: Neurology

## 2022-03-05 ENCOUNTER — Encounter: Payer: Medicare Other | Admitting: Neurology

## 2022-03-24 IMAGING — CT CT HEAD W/O CM
4 of 5 series · 15 of 47 positions shown, 17 images · non-contrast
Comparison: CT head 10/02/2020

CLINICAL DATA: Mental status change



[Series 3: head wo · axial · 0.37mm/px · z∈[-54,+45]mm · 4 of 34 slices shown]
[im 7/34  brain]
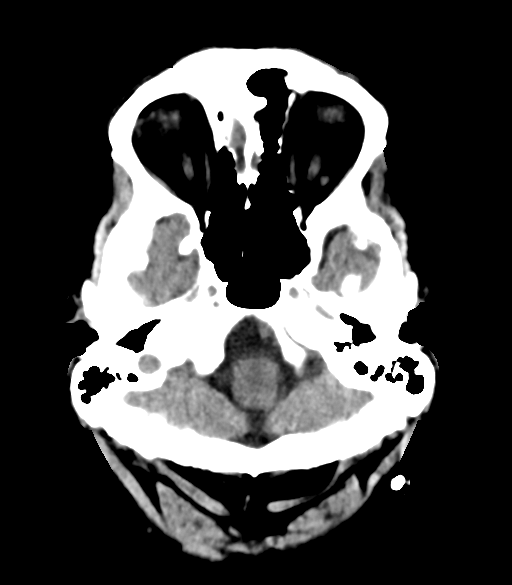
[im 14/34  brain]
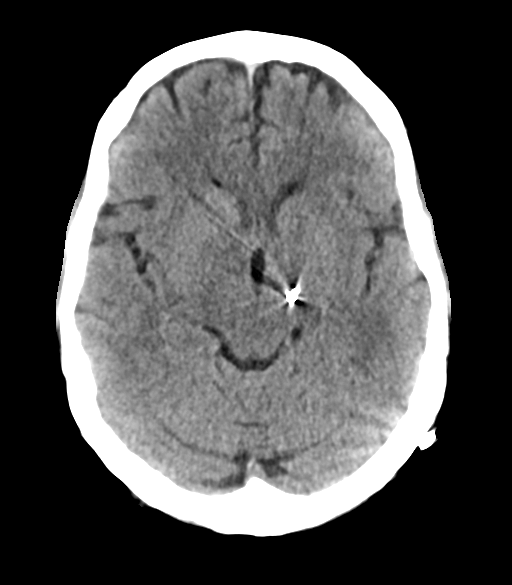
[im 20/34  brain]
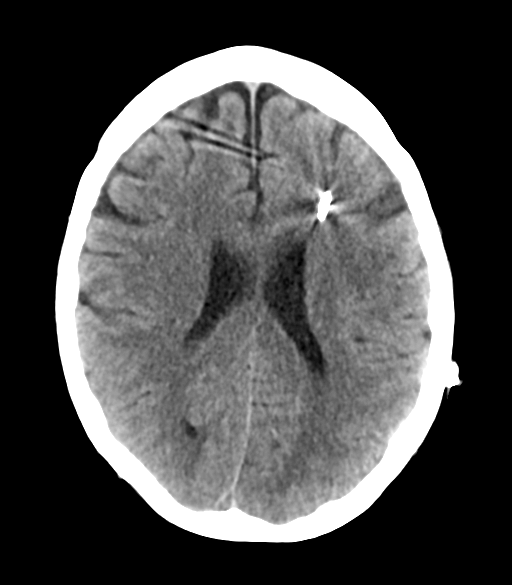
[im 27/34  brain]
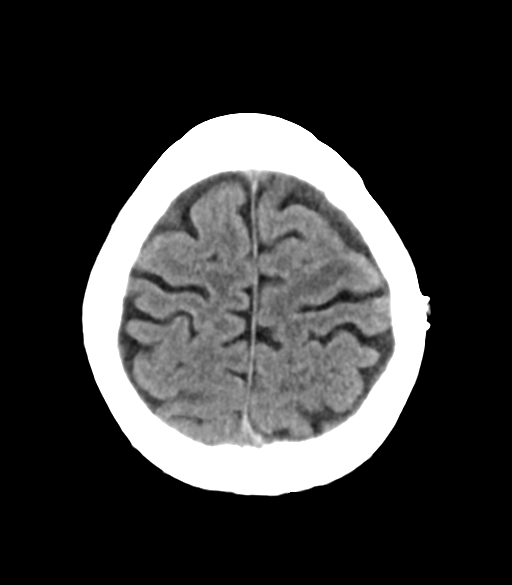

[Series 5: cor soft · coronal · 0.33mm/px · 3 of 68 slices shown]
[im 23/68  brain]
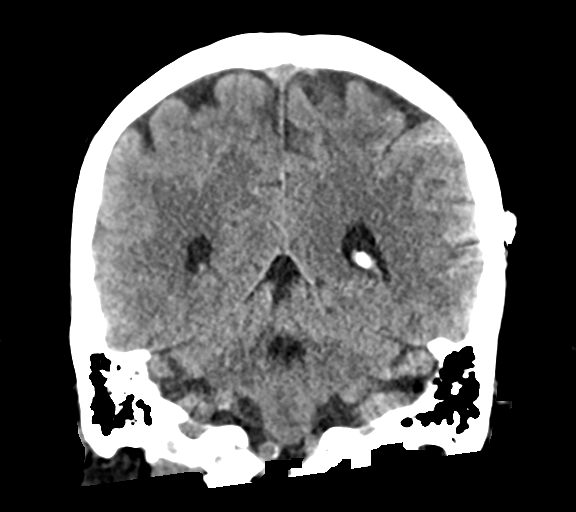
[im 30/68  brain]
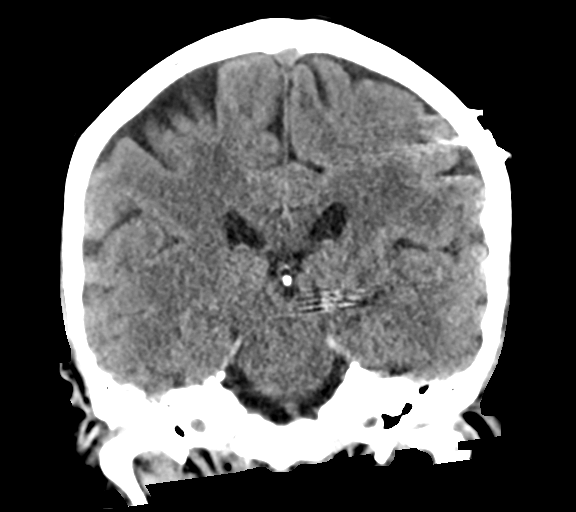
[im 38/68  brain]
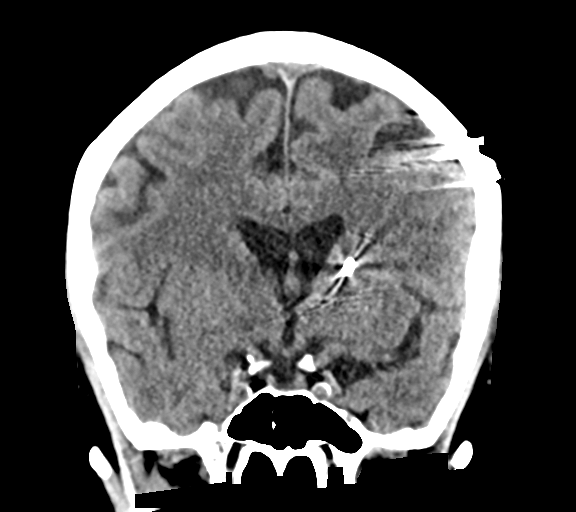

[Series 6: sag soft · sagittal · 0.33mm/px · 3 of 53 slices shown]
[im 19/53  brain]
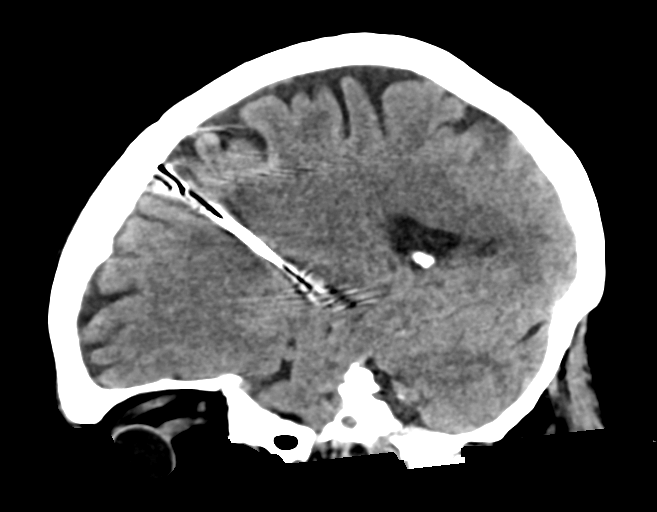
[im 27/53  brain]
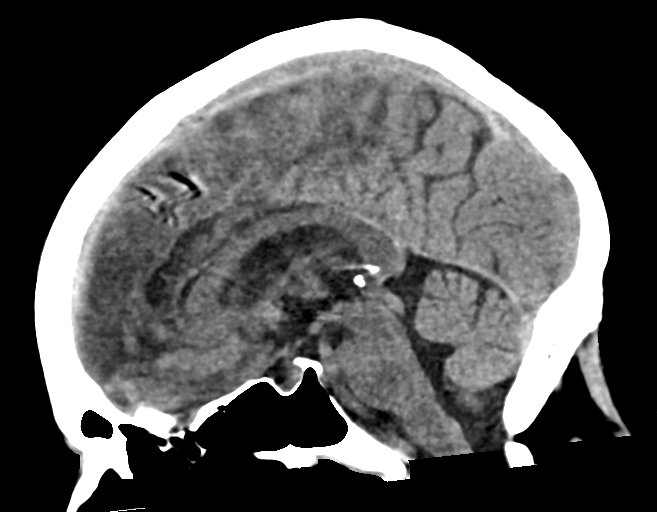
[im 35/53  brain]
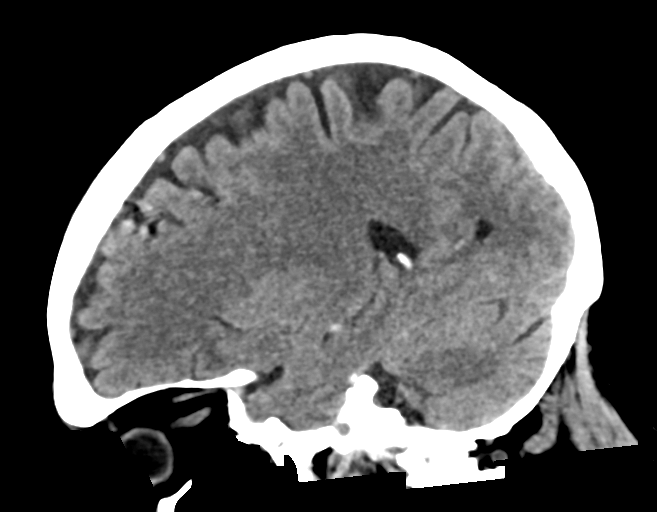

[Series 7: head (person_name) · axial · 0.36mm/px · z∈[-106,+1]mm · 5 of 34 slices shown, 7 images]
[im 6/34  brain]
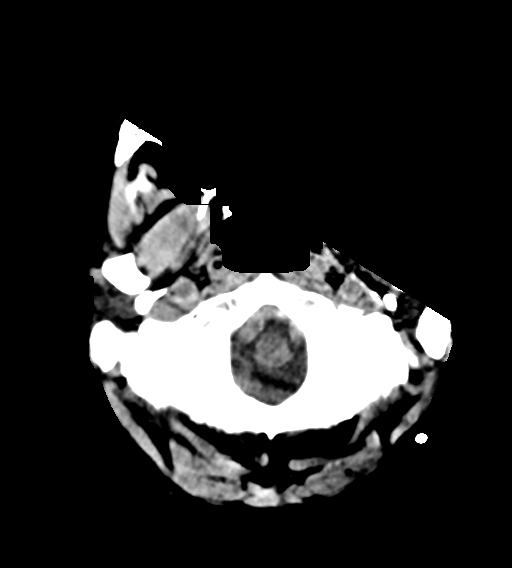
[im 6/34  bone]
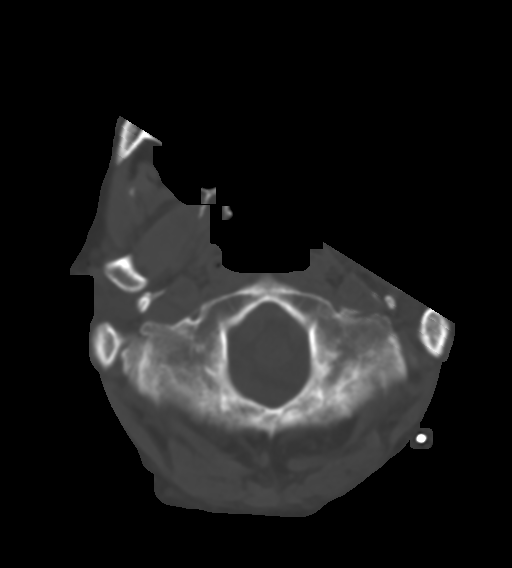
[im 12/34  brain]
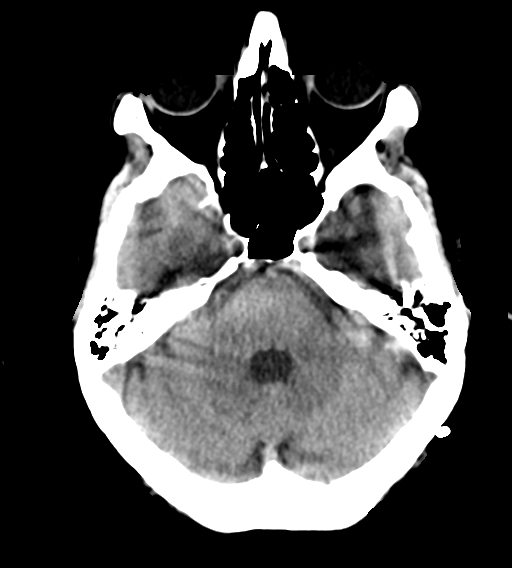
[im 17/34  brain]
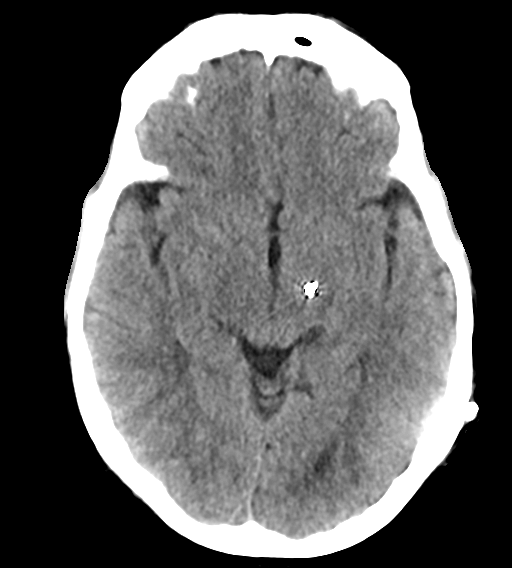
[im 23/34  brain]
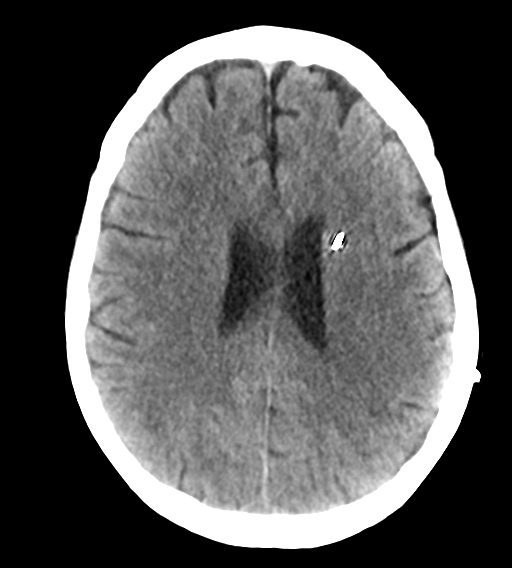
[im 28/34  brain]
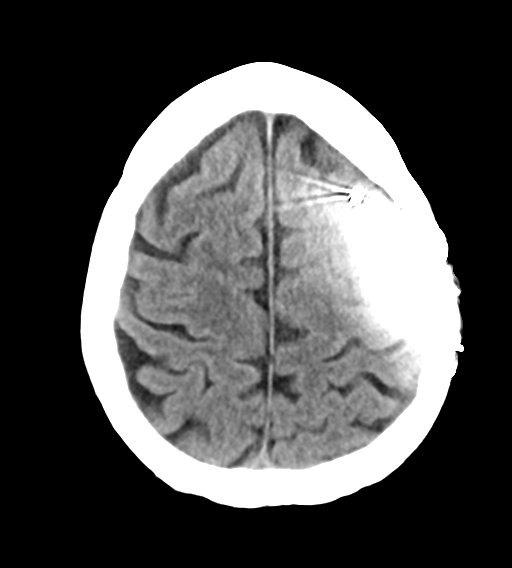
[im 28/34  bone]
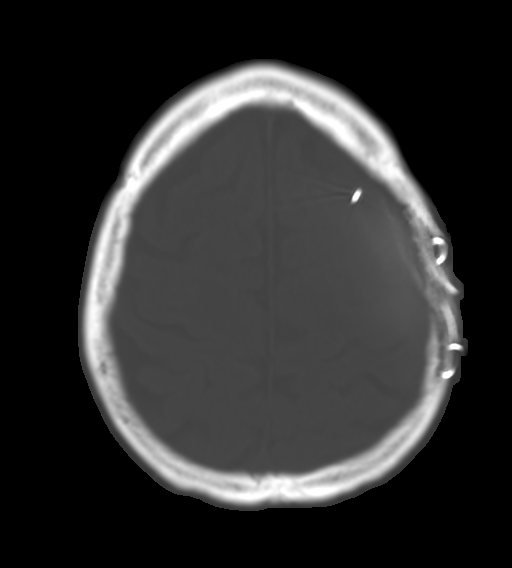

[15 of 47 positions shown; findings below may reference images not displayed]

FINDINGS: Brain: No evidence of acute infarction, hemorrhage, hydrocephalus,
extra-axial collection or mass lesion/mass effect. Unilateral deep
brain stimulator on the left extending into the thalamus unchanged.

Vascular: Negative for hyperdense vessel

Skull: No acute abnormality.  Left frontal burr hole

Sinuses/Orbits: Paranasal sinuses clear. Bilateral cataract
extraction

Other: None
IMPRESSION: No acute intracranial abnormality. Unilateral deep brain stimulator
on the left unchanged in position.

## 2022-05-06 IMAGING — DX DG CHEST 2V
2 series · 2 of 2 positions shown · non-contrast
Comparison: 12/09/2021

CLINICAL DATA: follow up lingular infiltrate

EXAM:
CHEST - 2 VIEW

[chest pa]
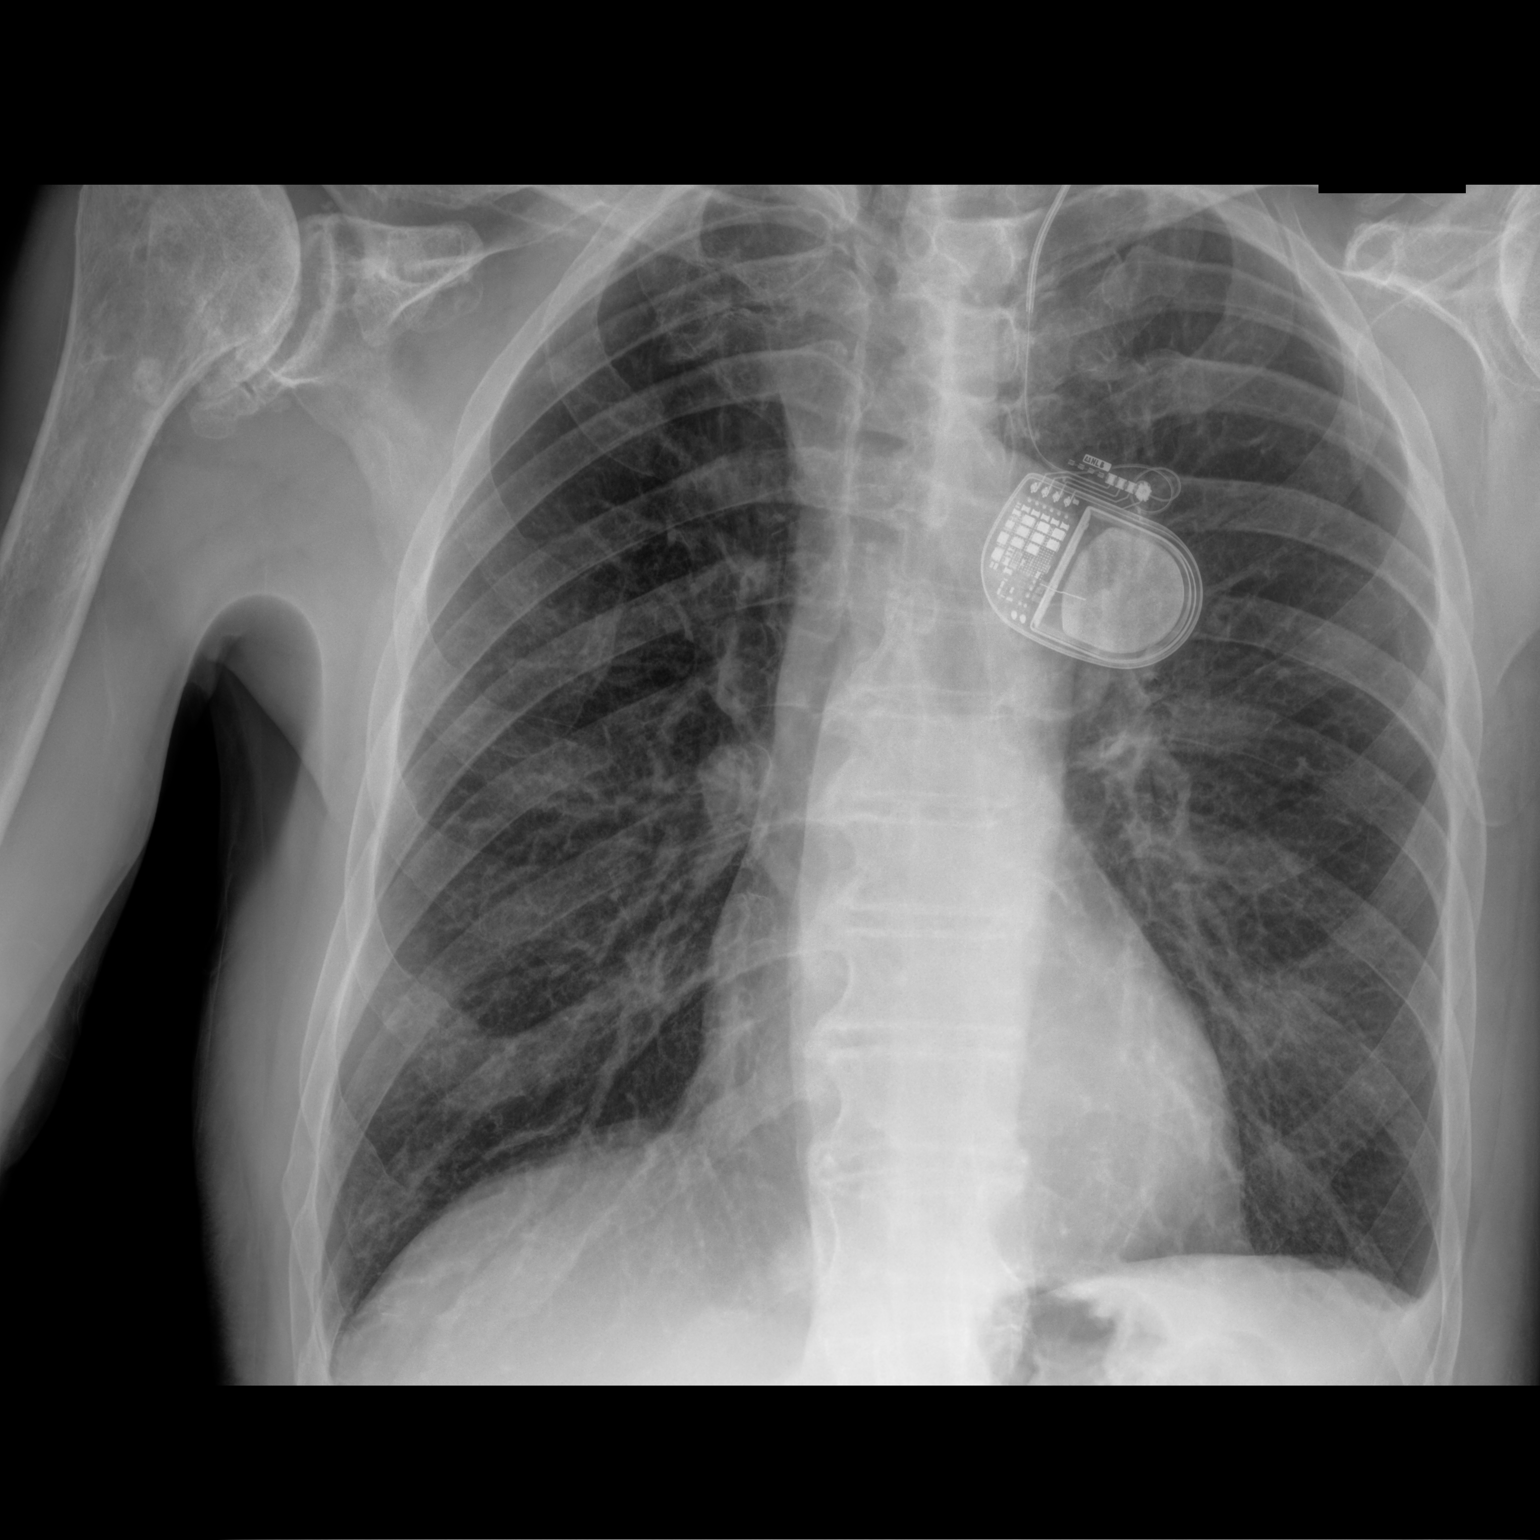

[chest lat]
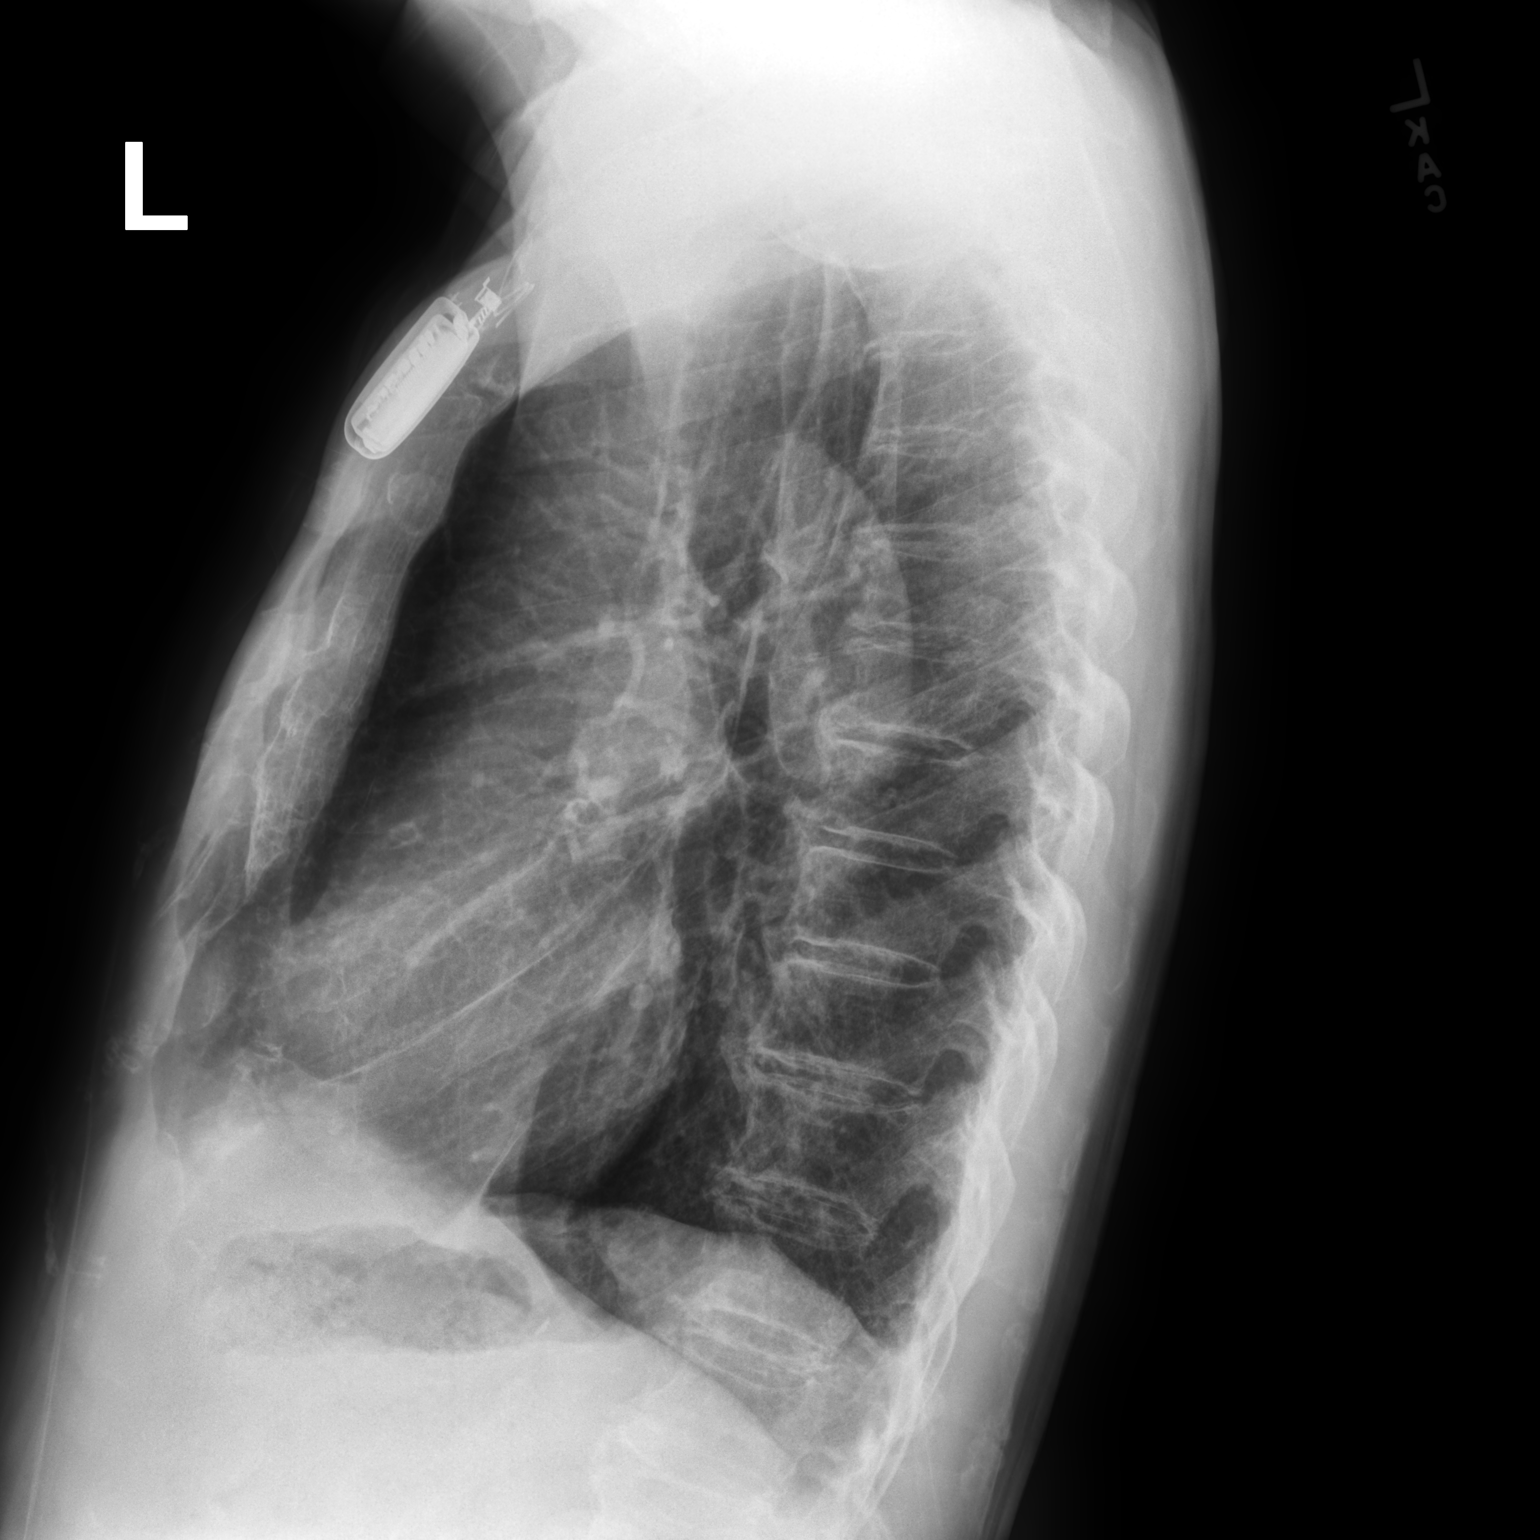

[2 of 2 positions shown; findings below may reference images not displayed]

FINDINGS: Cardiomediastinal silhouette and pulmonary vasculature are within
normal limits.

Neurostimulator battery pack seen in the left chest with lead
extending towards the neck.

Unchanged blunting of the left lateral costophrenic angle likely due
to scarring or minimal pleural effusion. The lungs are hyperexpanded
but otherwise clear.

Severe degenerative changes of the shoulders.
IMPRESSION: 1. No evidence of pneumonia.
2. Emphysema.
3. Chronic blunting of left lateral costophrenic angle may be due to
scarring or minimal effusion.

## 2022-05-25 ENCOUNTER — Encounter: Payer: Medicare Other | Admitting: Internal Medicine
